# Patient Record
Sex: Male | Born: 1945
Health system: Southern US, Community
[De-identification: ages and names within clinical notes are randomized; demographics above are authoritative.]

## PROBLEM LIST (undated history)

## (undated) DIAGNOSIS — J449 Chronic obstructive pulmonary disease, unspecified: Secondary | ICD-10-CM

## (undated) DIAGNOSIS — E079 Disorder of thyroid, unspecified: Secondary | ICD-10-CM

## (undated) DIAGNOSIS — M1990 Unspecified osteoarthritis, unspecified site: Secondary | ICD-10-CM

## (undated) DIAGNOSIS — J189 Pneumonia, unspecified organism: Secondary | ICD-10-CM

## (undated) DIAGNOSIS — I10 Essential (primary) hypertension: Secondary | ICD-10-CM

## (undated) HISTORY — PX: KNEE ARTHROSCOPY: SUR90

## (undated) HISTORY — PX: NO PAST SURGERIES: SHX2092

---

## 2000-04-08 ENCOUNTER — Encounter: Payer: Self-pay | Admitting: Orthopedic Surgery

## 2000-04-08 ENCOUNTER — Encounter: Admission: RE | Admit: 2000-04-08 | Discharge: 2000-04-08 | Payer: Self-pay | Admitting: Orthopedic Surgery

## 2000-04-26 ENCOUNTER — Encounter: Admission: RE | Admit: 2000-04-26 | Discharge: 2000-07-25 | Payer: Self-pay | Admitting: Orthopedic Surgery

## 2003-03-30 ENCOUNTER — Encounter: Admission: RE | Admit: 2003-03-30 | Discharge: 2003-03-30 | Payer: Self-pay | Admitting: Family Medicine

## 2003-03-30 ENCOUNTER — Encounter: Payer: Self-pay | Admitting: Family Medicine

## 2008-02-20 ENCOUNTER — Ambulatory Visit (HOSPITAL_COMMUNITY): Admission: RE | Admit: 2008-02-20 | Discharge: 2008-02-20 | Payer: Self-pay | Admitting: *Deleted

## 2008-11-21 ENCOUNTER — Encounter: Admission: RE | Admit: 2008-11-21 | Discharge: 2008-11-21 | Payer: Self-pay | Admitting: Internal Medicine

## 2008-12-06 ENCOUNTER — Encounter (INDEPENDENT_AMBULATORY_CARE_PROVIDER_SITE_OTHER): Payer: Self-pay | Admitting: Interventional Radiology

## 2008-12-06 ENCOUNTER — Other Ambulatory Visit: Admission: RE | Admit: 2008-12-06 | Discharge: 2008-12-06 | Payer: Self-pay | Admitting: Interventional Radiology

## 2008-12-06 ENCOUNTER — Encounter: Admission: RE | Admit: 2008-12-06 | Discharge: 2008-12-06 | Payer: Self-pay | Admitting: Internal Medicine

## 2009-04-16 ENCOUNTER — Emergency Department (HOSPITAL_COMMUNITY): Admission: EM | Admit: 2009-04-16 | Discharge: 2009-04-16 | Payer: Self-pay | Admitting: Emergency Medicine

## 2011-02-01 DEATH — deceased

## 2011-03-17 NOTE — Op Note (Signed)
[name], [name]                ACCOUNT NO.:  [id number]   MEDICAL RECORD NO.:  [id number]          PATIENT TYPE:  AMB   LOCATION:  ENDO                         FACILITY:  [facility]   PHYSICIAN:  Waverly Ferrari, [name]    DATE OF BIRTH:  [birth year]   DATE OF PROCEDURE:  02/20/2008  DATE OF DISCHARGE:                               OPERATIVE REPORT   PROCEDURE:  Colonoscopy.   INDICATIONS:  Colon cancer screening.   ANESTHESIA:  Fentanyl 75 mcg, Versed 6 mg.   PROCEDURE:  With the patient mildly sedated in the left lateral  decubitus position, rectal examination was performed which was  unremarkable to my exam.  Subsequently the Pentax videoscopic  colonoscope was inserted in the rectum and passed under direct vision  rather easily to the cecum, identified with pressure applied.  The cecum  was identified by ileocecal valve and appendiceal orifice both of which  were photographed.  From this point colonoscope was slowly withdrawn  taking circumferential views of colonic mucosa stopping to photograph an  occasional diverticulum seen along the way until we reached the rectum  which appeared normal on direct and showed hemorrhoids on retroflexed  view.  The endoscope was straightened and withdrawn,  The patient's  vital signs, pulse oximeter remained stable.  The patient tolerated  procedure well without apparent complications.   FINDINGS:  Scattered diverticula throughout the colon.  Internal  hemorrhoids, otherwise unremarkable exam.   PLAN:  Consider repeat examination in 5-10 years.           ______________________________  Waverly Ferrari, [name]     GMO/MEDQ  D:  02/20/2008  T:  02/20/2008  Job:  [id number]

## 2011-12-09 ENCOUNTER — Other Ambulatory Visit: Payer: Self-pay | Admitting: Internal Medicine

## 2011-12-09 ENCOUNTER — Ambulatory Visit
Admission: RE | Admit: 2011-12-09 | Discharge: 2011-12-09 | Disposition: A | Discharge status: Home / Self Care | Payer: Medicare Other | Source: Ambulatory Visit | Attending: Internal Medicine | Admitting: Internal Medicine

## 2011-12-09 DIAGNOSIS — R0602 Shortness of breath: Secondary | ICD-10-CM

## 2011-12-09 MED ORDER — IOHEXOL 350 MG/ML SOLN
125.0000 mL | Freq: Once | INTRAVENOUS | Status: AC | PRN
  Administered 2011-12-09: 125 mL via INTRAVENOUS

## 2011-12-15 ENCOUNTER — Emergency Department (HOSPITAL_COMMUNITY): Payer: Medicare Other

## 2011-12-15 ENCOUNTER — Institutional Professional Consult (permissible substitution): Payer: Medicare Other | Admitting: Critical Care Medicine

## 2011-12-15 ENCOUNTER — Inpatient Hospital Stay (HOSPITAL_COMMUNITY)
Admission: EM | Admit: 2011-12-15 | Discharge: 2011-12-18 | DRG: 193 | Disposition: A | Discharge status: Home / Self Care | Payer: Medicare Other | Attending: Internal Medicine | Admitting: Internal Medicine

## 2011-12-15 ENCOUNTER — Encounter (HOSPITAL_COMMUNITY): Payer: Self-pay | Admitting: Emergency Medicine

## 2011-12-15 ENCOUNTER — Other Ambulatory Visit: Payer: Self-pay

## 2011-12-15 DIAGNOSIS — J9600 Acute respiratory failure, unspecified whether with hypoxia or hypercapnia: Secondary | ICD-10-CM | POA: Diagnosis present

## 2011-12-15 DIAGNOSIS — Z79899 Other long term (current) drug therapy: Secondary | ICD-10-CM

## 2011-12-15 DIAGNOSIS — J84112 Idiopathic pulmonary fibrosis: Secondary | ICD-10-CM

## 2011-12-15 DIAGNOSIS — Z87891 Personal history of nicotine dependence: Secondary | ICD-10-CM

## 2011-12-15 DIAGNOSIS — E119 Type 2 diabetes mellitus without complications: Secondary | ICD-10-CM | POA: Diagnosis present

## 2011-12-15 DIAGNOSIS — I10 Essential (primary) hypertension: Secondary | ICD-10-CM | POA: Diagnosis present

## 2011-12-15 DIAGNOSIS — M069 Rheumatoid arthritis, unspecified: Secondary | ICD-10-CM | POA: Diagnosis present

## 2011-12-15 DIAGNOSIS — J189 Pneumonia, unspecified organism: Principal | ICD-10-CM | POA: Diagnosis present

## 2011-12-15 DIAGNOSIS — J8410 Pulmonary fibrosis, unspecified: Secondary | ICD-10-CM | POA: Diagnosis present

## 2011-12-15 DIAGNOSIS — E039 Hypothyroidism, unspecified: Secondary | ICD-10-CM | POA: Diagnosis present

## 2011-12-15 HISTORY — DX: Essential (primary) hypertension: I10

## 2011-12-15 HISTORY — DX: Unspecified osteoarthritis, unspecified site: M19.90

## 2011-12-15 HISTORY — DX: Pneumonia, unspecified organism: J18.9

## 2011-12-15 HISTORY — DX: Disorder of thyroid, unspecified: E07.9

## 2011-12-15 LAB — BLOOD GAS, ARTERIAL
Acid-Base Excess: 2.8 mmol/L — ABNORMAL HIGH (ref 0.0–2.0)
Bicarbonate: 26.4 mEq/L — ABNORMAL HIGH (ref 20.0–24.0)
Drawn by: 246861
O2 Content: 2 L/min
O2 Saturation: 94.8 %
Patient temperature: 98.6
TCO2: 23.4 mmol/L (ref 0–100)
pCO2 arterial: 38.4 mmHg (ref 35.0–45.0)
pH, Arterial: 7.452 — ABNORMAL HIGH (ref 7.350–7.450)
pO2, Arterial: 74.2 mmHg — ABNORMAL LOW (ref 80.0–100.0)

## 2011-12-15 LAB — COMPREHENSIVE METABOLIC PANEL
ALT: 28 U/L (ref 0–53)
AST: 26 U/L (ref 0–37)
Albumin: 2.9 g/dL — ABNORMAL LOW (ref 3.5–5.2)
Alkaline Phosphatase: 40 U/L (ref 39–117)
BUN: 15 mg/dL (ref 6–23)
CO2: 23 mEq/L (ref 19–32)
Calcium: 8.2 mg/dL — ABNORMAL LOW (ref 8.4–10.5)
Chloride: 100 mEq/L (ref 96–112)
Creatinine, Ser: 1.07 mg/dL (ref 0.50–1.35)
GFR calc Af Amer: 82 mL/min — ABNORMAL LOW (ref >90)
GFR calc non Af Amer: 70 mL/min — ABNORMAL LOW (ref >90)
Glucose, Bld: 290 mg/dL — ABNORMAL HIGH (ref 70–99)
Potassium: 4.5 mEq/L (ref 3.5–5.1)
Sodium: 134 mEq/L — ABNORMAL LOW (ref 135–145)
Total Bilirubin: 0.3 mg/dL (ref 0.3–1.2)
Total Protein: 7.6 g/dL (ref 6.0–8.3)

## 2011-12-15 LAB — CBC
HCT: 39 % (ref 39.0–52.0)
HCT: 37.1 % — ABNORMAL LOW (ref 39.0–52.0)
Hemoglobin: 13.3 g/dL (ref 13.0–17.0)
Hemoglobin: 12.2 g/dL — ABNORMAL LOW (ref 13.0–17.0)
MCH: 30.4 pg (ref 26.0–34.0)
MCH: 29.4 pg (ref 26.0–34.0)
MCHC: 34.1 g/dL (ref 30.0–36.0)
MCHC: 32.9 g/dL (ref 30.0–36.0)
MCV: 89.2 fL (ref 78.0–100.0)
MCV: 89.4 fL (ref 78.0–100.0)
Platelets: 240 10*3/uL (ref 150–400)
Platelets: 246 10*3/uL (ref 150–400)
RBC: 4.37 MIL/uL (ref 4.22–5.81)
RBC: 4.15 MIL/uL — ABNORMAL LOW (ref 4.22–5.81)
RDW: 15.2 % (ref 11.5–15.5)
RDW: 15 % (ref 11.5–15.5)
WBC: 7 10*3/uL (ref 4.0–10.5)
WBC: 4.4 10*3/uL (ref 4.0–10.5)

## 2011-12-15 LAB — DIFFERENTIAL
Basophils Absolute: 0 10*3/uL (ref 0.0–0.1)
Basophils Relative: 0 % (ref 0–1)
Eosinophils Absolute: 0 10*3/uL (ref 0.0–0.7)
Eosinophils Relative: 0 % (ref 0–5)
Lymphocytes Relative: 12 % (ref 12–46)
Lymphs Abs: 0.5 10*3/uL — ABNORMAL LOW (ref 0.7–4.0)
Monocytes Absolute: 0 10*3/uL — ABNORMAL LOW (ref 0.1–1.0)
Monocytes Relative: 1 % — ABNORMAL LOW (ref 3–12)
Neutro Abs: 3.8 10*3/uL (ref 1.7–7.7)
Neutrophils Relative %: 87 % — ABNORMAL HIGH (ref 43–77)

## 2011-12-15 LAB — CARDIAC PANEL(CRET KIN+CKTOT+MB+TROPI)
CK, MB: 1.8 ng/mL (ref 0.3–4.0)
Relative Index: 1.2 (ref 0.0–2.5)
Total CK: 152 U/L (ref 7–232)
Troponin I: <0.3 ng/mL (ref <0.30)

## 2011-12-15 LAB — SEDIMENTATION RATE: Sed Rate: 40 mm/hr — ABNORMAL HIGH (ref 0–16)

## 2011-12-15 LAB — BASIC METABOLIC PANEL
BUN: 8 mg/dL (ref 6–23)
CO2: 26 mEq/L (ref 19–32)
Calcium: 8.5 mg/dL (ref 8.4–10.5)
Chloride: 102 mEq/L (ref 96–112)
Creatinine, Ser: 0.92 mg/dL (ref 0.50–1.35)
GFR calc Af Amer: >90 mL/min (ref >90)
GFR calc non Af Amer: 86 mL/min — ABNORMAL LOW (ref >90)
Glucose, Bld: 126 mg/dL — ABNORMAL HIGH (ref 70–99)
Potassium: 3.6 mEq/L (ref 3.5–5.1)
Sodium: 138 mEq/L (ref 135–145)

## 2011-12-15 LAB — PRO B NATRIURETIC PEPTIDE
Pro B Natriuretic peptide (BNP): 79.3 pg/mL (ref 0–125)
Pro B Natriuretic peptide (BNP): 103.7 pg/mL (ref 0–125)

## 2011-12-15 MED ORDER — ALBUTEROL SULFATE (5 MG/ML) 0.5% IN NEBU
2.5000 mg | INHALATION_SOLUTION | Freq: Four times a day (QID) | RESPIRATORY_TRACT | Status: DC
  Administered 2011-12-15: 2.5 mg via RESPIRATORY_TRACT

## 2011-12-15 MED ORDER — HYDROMORPHONE HCL PF 1 MG/ML IJ SOLN: 0.5000 mg | INTRAMUSCULAR | Status: DC | PRN

## 2011-12-15 MED ORDER — ALBUTEROL SULFATE (5 MG/ML) 0.5% IN NEBU
5.0000 mg | INHALATION_SOLUTION | Freq: Once | RESPIRATORY_TRACT | Status: AC
  Administered 2011-12-15: 5 mg via RESPIRATORY_TRACT
  Filled 2011-12-15: qty 1

## 2011-12-15 MED ORDER — SODIUM CHLORIDE 0.9 % IV SOLN
INTRAVENOUS | Status: DC
  Administered 2011-12-15: 13:00:00 via INTRAVENOUS

## 2011-12-15 MED ORDER — ONDANSETRON HCL 4 MG/2ML IJ SOLN: 4.0000 mg | Freq: Four times a day (QID) | INTRAMUSCULAR | Status: DC | PRN

## 2011-12-15 MED ORDER — LEVOTHYROXINE SODIUM 25 MCG PO TABS
25.0000 ug | ORAL_TABLET | Freq: Every day | ORAL | Status: DC
  Filled 2011-12-15: qty 1

## 2011-12-15 MED ORDER — LEVOTHYROXINE SODIUM 25 MCG PO TABS
25.0000 ug | ORAL_TABLET | Freq: Every day | ORAL | Status: DC
  Administered 2011-12-16 – 2011-12-18 (×3): 25 ug via ORAL
  Filled 2011-12-15 (×5): qty 1

## 2011-12-15 MED ORDER — ALBUTEROL SULFATE (5 MG/ML) 0.5% IN NEBU
2.5000 mg | INHALATION_SOLUTION | Freq: Four times a day (QID) | RESPIRATORY_TRACT | Status: DC
  Administered 2011-12-15 – 2011-12-18 (×12): 2.5 mg via RESPIRATORY_TRACT
  Filled 2011-12-15 (×8): qty 0.5
  Filled 2011-12-15: qty 1
  Filled 2011-12-15 (×3): qty 0.5

## 2011-12-15 MED ORDER — PIPERACILLIN-TAZOBACTAM 3.375 G IVPB
3.3750 g | Freq: Once | INTRAVENOUS | Status: AC
  Administered 2011-12-15: 3.375 g via INTRAVENOUS
  Filled 2011-12-15: qty 50

## 2011-12-15 MED ORDER — METHYLPREDNISOLONE SODIUM SUCC 40 MG IJ SOLR
40.0000 mg | Freq: Four times a day (QID) | INTRAMUSCULAR | Status: DC
  Administered 2011-12-15: 40 mg via INTRAVENOUS
  Filled 2011-12-15: qty 1

## 2011-12-15 MED ORDER — PIOGLITAZONE HCL 15 MG PO TABS
15.0000 mg | ORAL_TABLET | Freq: Two times a day (BID) | ORAL | Status: DC
  Administered 2011-12-16 – 2011-12-18 (×5): 15 mg via ORAL
  Filled 2011-12-15 (×8): qty 1

## 2011-12-15 MED ORDER — GUAIFENESIN-DM 100-10 MG/5ML PO SYRP: 5.0000 mL | ORAL_SOLUTION | ORAL | Status: DC | PRN

## 2011-12-15 MED ORDER — METFORMIN HCL 850 MG PO TABS
850.0000 mg | ORAL_TABLET | Freq: Two times a day (BID) | ORAL | Status: DC
  Administered 2011-12-16 – 2011-12-17 (×3): 850 mg via ORAL
  Filled 2011-12-15 (×6): qty 1

## 2011-12-15 MED ORDER — AMLODIPINE BESYLATE 10 MG PO TABS
10.0000 mg | ORAL_TABLET | Freq: Every day | ORAL | Status: DC
  Administered 2011-12-15 – 2011-12-18 (×4): 10 mg via ORAL
  Filled 2011-12-15 (×4): qty 1

## 2011-12-15 MED ORDER — PIOGLITAZONE HCL-METFORMIN HCL 15-850 MG PO TABS: 1.0000 | ORAL_TABLET | Freq: Two times a day (BID) | ORAL | Status: DC

## 2011-12-15 MED ORDER — AZITHROMYCIN 500 MG IV SOLR
500.0000 mg | Freq: Once | INTRAVENOUS | Status: AC
  Administered 2011-12-15: 500 mg via INTRAVENOUS
  Filled 2011-12-15: qty 500

## 2011-12-15 MED ORDER — PANTOPRAZOLE SODIUM 40 MG PO TBEC
40.0000 mg | DELAYED_RELEASE_TABLET | Freq: Two times a day (BID) | ORAL | Status: DC
  Administered 2011-12-16 – 2011-12-18 (×5): 40 mg via ORAL
  Filled 2011-12-15 (×7): qty 1

## 2011-12-15 MED ORDER — ACETAMINOPHEN 650 MG RE SUPP: 650.0000 mg | Freq: Four times a day (QID) | RECTAL | Status: DC | PRN

## 2011-12-15 MED ORDER — DEXTROSE 5 % IV SOLN
500.0000 mg | INTRAVENOUS | Status: DC
  Administered 2011-12-16: 500 mg via INTRAVENOUS
  Filled 2011-12-15 (×2): qty 500

## 2011-12-15 MED ORDER — FOLIC ACID 800 MCG PO TABS: 400.0000 ug | ORAL_TABLET | Freq: Every day | ORAL | Status: DC

## 2011-12-15 MED ORDER — ACETAMINOPHEN 325 MG PO TABS
650.0000 mg | ORAL_TABLET | Freq: Four times a day (QID) | ORAL | Status: DC | PRN
  Administered 2011-12-17: 650 mg via ORAL
  Filled 2011-12-15: qty 2

## 2011-12-15 MED ORDER — METHYLPREDNISOLONE SODIUM SUCC 125 MG IJ SOLR
125.0000 mg | Freq: Once | INTRAMUSCULAR | Status: AC
  Administered 2011-12-15: 125 mg via INTRAVENOUS
  Filled 2011-12-15: qty 2

## 2011-12-15 MED ORDER — METHYLPREDNISOLONE SODIUM SUCC 125 MG IJ SOLR
80.0000 mg | Freq: Two times a day (BID) | INTRAMUSCULAR | Status: DC
  Administered 2011-12-16 – 2011-12-17 (×4): 80 mg via INTRAVENOUS
  Filled 2011-12-15 (×7): qty 2

## 2011-12-15 MED ORDER — FOLIC ACID 0.5 MG HALF TAB
0.5000 mg | ORAL_TABLET | Freq: Every day | ORAL | Status: DC
  Administered 2011-12-15 – 2011-12-18 (×4): 0.5 mg via ORAL
  Filled 2011-12-15 (×4): qty 1

## 2011-12-15 MED ORDER — ENOXAPARIN SODIUM 60 MG/0.6ML ~~LOC~~ SOLN
50.0000 mg | SUBCUTANEOUS | Status: DC
  Administered 2011-12-16 – 2011-12-17 (×2): 50 mg via SUBCUTANEOUS
  Filled 2011-12-15 (×3): qty 0.6

## 2011-12-15 MED ORDER — ALBUTEROL SULFATE (5 MG/ML) 0.5% IN NEBU: 2.5000 mg | INHALATION_SOLUTION | RESPIRATORY_TRACT | Status: DC | PRN

## 2011-12-15 MED ORDER — ENOXAPARIN SODIUM 40 MG/0.4ML ~~LOC~~ SOLN
40.0000 mg | SUBCUTANEOUS | Status: DC
  Administered 2011-12-15: 40 mg via SUBCUTANEOUS
  Filled 2011-12-15 (×2): qty 0.4

## 2011-12-15 MED ORDER — VANCOMYCIN HCL IN DEXTROSE 1-5 GM/200ML-% IV SOLN
1000.0000 mg | Freq: Once | INTRAVENOUS | Status: AC
  Administered 2011-12-15: 1000 mg via INTRAVENOUS
  Filled 2011-12-15: qty 200

## 2011-12-15 MED ORDER — IPRATROPIUM BROMIDE 0.02 % IN SOLN
0.5000 mg | Freq: Four times a day (QID) | RESPIRATORY_TRACT | Status: DC
  Administered 2011-12-15 – 2011-12-18 (×12): 0.5 mg via RESPIRATORY_TRACT
  Filled 2011-12-15 (×12): qty 2.5

## 2011-12-15 MED ORDER — ONDANSETRON HCL 4 MG PO TABS: 4.0000 mg | ORAL_TABLET | Freq: Four times a day (QID) | ORAL | Status: DC | PRN

## 2011-12-15 NOTE — Consult Note (Signed)
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 He developed increased DOE to were he was SOB walking >15 feet. Normally prior to 1 month age he had no limitation of activities(confirmed by family). + for night sweats but no fever, chills or purulent sputum. He went to his PCP Dr. Jillyn Ledger and was dx with Pna and treated with 2 rounds of Avelox and Prednisone without any improvement in sxs. He has a appointment with Dr. Chase Caller of pulmonary but on 2/12 due SOB he presented to [facility] ED and was noted to be hypoxic.  Report to have to have UIP on ct scan. CxR with ?progressive pna and PCCM asked to evaluate  Pm 2/12  Lines / Drains: none  Cultures: 2/12 bc x 2>>  Antibiotics: 2/12 vanc ([facility])>> 2/12 zithro ([facility])>> 2/12 zoysn([facility])>>  Tests / Events:   Subjective: NAD at rest on 02, denies change in chronic dry cough  Past Medical History  Diagnosis Date  . Hypertension   . Diabetes mellitus   . Arthritis   . Thyroid disease   . Pneumonia    Past Surgical History  Procedure Date  . No past surgeries    Prior to Admission medications   Medication Sig Start Date End Date Taking? Authorizing Provider  albuterol-ipratropium (COMBIVENT) 18-103 MCG/ACT inhaler Inhale 2 puffs into the lungs every 6 (six) hours as needed. For shortness of breath   Yes Historical Provider, MD  amLODipine (NORVASC) 10 MG tablet Take 10 mg by mouth daily.   Yes Historical Provider, MD  budesonide (PULMICORT) 0.5 MG/2ML nebulizer solution Take 0.5 mg by nebulization 2 (two) times daily.   Yes Historical Provider, MD  folic acid (FOLVITE) 390 MCG tablet Take 400 mcg by mouth daily.   Yes Historical Provider, MD  levothyroxine (SYNTHROID, LEVOTHROID) 25 MCG tablet Take 25 mcg by mouth daily.   Yes Historical Provider, MD    methotrexate 2.5 MG tablet Take 2.5 mg by mouth once a week. On Thursday. Patient takes 8 tablets once per week   Yes Historical Provider, MD  moxifloxacin (AVELOX) 400 MG tablet Take 400 mg by mouth daily. For 10 days. 12/08/11 12/18/11 Yes Historical Provider, MD  pioglitazone-metformin (ACTOPLUS MET) 15-850 MG per tablet Take 1 tablet by mouth 2 (two) times daily with a meal.   Yes Historical Provider, MD  predniSONE (DELTASONE) 5 MG tablet Take 5 mg by mouth daily.   Yes Historical Provider, MD   Allergies No Known Allergies  Family History Family History  Problem Relation Age of Onset  . Diabetes Mother   . Stroke Mother     Social History  reports that he quit smoking about 40 years ago. He has never used smokeless tobacco. He reports that he does not drink alcohol or use illicit drugs.  Review Of Systems  11 points review of systems is negative with an exception of listed in HPI. No toxic exposures.  Vital Signs: Temp:  [98.8 F (37.1 C)] 98.8 F (37.1 C) (02/12 0708) Pulse Rate:  [114] 114  (02/12 0708) Resp:  [24] 24  (02/12 0708) BP: (157)/(73) 157/73 mmHg (02/12 0708) SpO2:  [88 %-97 %] 96 % (02/12 0820) FiO2 (%):  [28 %] 28 % (02/12 0727)    Physical Examination: General:  [id number]  Neuro:  intact HEENT:  Very poor dentation Cardiovascular:  hsr rrr Lungs:  No wheeze. Good air movement Abdomen: obese +bs Musculoskeletal:  No pedal edema Skin:  Warm and dry no edema   Labs and Imaging:  Dg Chest 2 View  12/15/2011  *RADIOLOGY REPORT*  Clinical Data: 66 year old male with cough, shortness of breath. Recent pneumonia.  CHEST - 2 VIEW  Comparison: Chest CT 12/09/2011 and earlier.  Findings: Slightly lower lung volumes.  Stable cardiac size and mediastinal contours.  Progression of mixed interstitial and airspace opacity in the right upper lobe and at both lung bases. The upper lobe has increased the most.  No pneumothorax.  No pleural effusion.  The left upper  lobe remain spared. No acute osseous abnormality identified.  IMPRESSION: Mild radiographic progression of bilateral pneumonia.  Original Report Authenticated By: Randall An, [name]    Lab 12/15/11 0750  NA 138  K 3.6  CL 102  CO2 26  BUN 8  CREATININE 0.92  GLUCOSE 126*    Lab 12/15/11 0750  HGB 13.3  HCT 39.0  WBC 7.0  PLT 240   ABG    Component Value Date/Time   PHART 7.452* 12/15/2011 0810   PCO2ART 38.4 12/15/2011 0810   PO2ART 74.2* 12/15/2011 0810   HCO3 26.4* 12/15/2011 0810   TCO2 23.4 12/15/2011 0810   O2SAT 94.8 12/15/2011 0810    Assessment and Plan: Hypoic resp failure in 66 yo AAM with recent URI with radigraphic changes non specific in pt with RA on MTX -O2 as needed -BD's -steroids -check esr/bnp may need 2 d echo -D/C MTX - the fact his RA is well controlled while resp status is worsening over > one month strongly favors mtx lung over RA lung dz and CAP    Best practices / Disposition: -->Floor status under [facility] -->full code -->LMWH for DVT Px -->Protonix for GI Px -->diet   Richardson Landry Minor ACNP Maryanna Shape PCCM Pager [phone] till 3 pm If no answer page [phone] 12/15/2011, 12:08 PM  Pt independently  seen and examined and available cxr's reviewed and I agree with above findings/ imp/ plan   Christinia Gully, MD Pulmonary and [facility] [phone]

## 2011-12-15 NOTE — ED Notes (Signed)
Pt reports shortness of breath with exertion, beginning 1 month ago, shortness of breath has been increasing.

## 2011-12-15 NOTE — H&P (Signed)
PCP:  Ramashandran  Chief Complaint:  Shortness of breath  HPI: Patient is a 66 year old African American male with past medical history significant for multiple medical problems including diabetes hypertension rheumatoid arthritis and thyroid disease. Patient stated that about 2-3 weeks ago he developed some shortness of breath, his primary care physician started him on antibiotic for bilateral pneumonia. He was treated for 7-10 days without any improvement in his symptoms. He followup up with his primary care physician and was given a course of Avelox, steroids and neb treatments. He did not have any improvement. He was sent to get a CT scan of the chest.. CT scan read usual interstitial pneumonitis. He was scheduled with an appointment to see the Pulmonologist. He continued to be short of breath so he came to the emergency room. He's had no chest pain. He complains of cough that's nonproductive. He does take methotrexate prescribed by his rheumatologist for rheumatoid arthritis.  Review of Systems:  Negative otherwise stated in the history of present illness.  Past Medical History: Past Medical History  Diagnosis Date  . Hypertension   . Diabetes mellitus   . Arthritis   . Thyroid disease   . Pneumonia    History reviewed. No pertinent past surgical history.  Medications: Prior to Admission medications   Medication Sig Start Date End Date Taking? Authorizing Provider  albuterol-ipratropium (COMBIVENT) 18-103 MCG/ACT inhaler Inhale 2 puffs into the lungs every 6 (six) hours as needed. For shortness of breath   Yes Historical Provider, MD  amLODipine (NORVASC) 10 MG tablet Take 10 mg by mouth daily.   Yes Historical Provider, MD  budesonide (PULMICORT) 0.5 MG/2ML nebulizer solution Take 0.5 mg by nebulization 2 (two) times daily.   Yes Historical Provider, MD  folic acid (FOLVITE) 628 MCG tablet Take 400 mcg by mouth daily.   Yes Historical Provider, MD  levothyroxine (SYNTHROID,  LEVOTHROID) 25 MCG tablet Take 25 mcg by mouth daily.   Yes Historical Provider, MD  methotrexate 2.5 MG tablet Take 2.5 mg by mouth once a week. On Thursday. Patient takes 8 tablets once per week   Yes Historical Provider, MD  moxifloxacin (AVELOX) 400 MG tablet Take 400 mg by mouth daily. For 10 days. 12/08/11 12/18/11 Yes Historical Provider, MD  pioglitazone-metformin (ACTOPLUS MET) 15-850 MG per tablet Take 1 tablet by mouth 2 (two) times daily with a meal.   Yes Historical Provider, MD  predniSONE (DELTASONE) 5 MG tablet Take 5 mg by mouth daily.   Yes Historical Provider, MD    Allergies:  No Known Allergies  Social History:  Reports that he has quit smoking. He has never used smokeless tobacco. He reports that he does not drink alcohol or use illicit drugs. The patient is married with one stepdaughter.   Family History: Family History  Problem Relation Age of Onset  . Diabetes Mother   . Stroke Mother     Physical Exam: Filed Vitals:   12/15/11 0708 12/15/11 0727 12/15/11 0741 12/15/11 0820  BP: 157/73     Pulse: 114     Temp: 98.8 F (37.1 C)     TempSrc: Oral     Resp: 24     SpO2: 88% 96% 97% 96%   General: Patient appears his stated age and does not seem to be in any acute distress. HEENT: Head normocephalic atraumatic. Lungs: Decreased breath sounds throughout with some rhonchi. Cardiovascular: Slightly tachycardic without murmurs rubs or gallops Abdomen: Positive bowel sounds Extremities: No edema Neurologic: Nonfocal.  Labs on Admission:   [facility] 12/15/11 0750  NA 138  K 3.6  CL 102  CO2 26  GLUCOSE 126*  BUN 8  CREATININE 0.92  CALCIUM 8.5  MG --  PHOS --   No results found for this basename: AST:2,ALT:2,ALKPHOS:2,BILITOT:2,PROT:2,ALBUMIN:2 in the last 72 hours No results found for this basename: LIPASE:2,AMYLASE:2 in the last 72 hours  Basename 12/15/11 0750  WBC 7.0  NEUTROABS --  HGB 13.3  HCT 39.0  MCV 89.2  PLT 240    Basename  12/15/11 0750  CKTOTAL 152  CKMB 1.8  CKMBINDEX --  TROPONINI <0.30   No results found for this basename: TSH,T4TOTAL,FREET3,T3FREE,THYROIDAB in the last 72 hours No results found for this basename: VITAMINB12:2,FOLATE:2,FERRITIN:2,TIBC:2,IRON:2,RETICCTPCT:2 in the last 72 hours  Radiological Exams on Admission: Dg Chest 2 View  12/15/2011  *RADIOLOGY REPORT*  Clinical Data: 66 year old male with cough, shortness of breath. Recent pneumonia.  CHEST - 2 VIEW  Comparison: Chest CT 12/09/2011 and earlier.  Findings: Slightly lower lung volumes.  Stable cardiac size and mediastinal contours.  Progression of mixed interstitial and airspace opacity in the right upper lobe and at both lung bases. The upper lobe has increased the most.  No pneumothorax.  No pleural effusion.  The left upper lobe remain spared. No acute osseous abnormality identified.  IMPRESSION: Mild radiographic progression of bilateral pneumonia.  Original Report Authenticated By: Randall An, [name]   Ct Angio Chest W/cm &/or Wo Cm  12/09/2011  *RADIOLOGY REPORT*  Clinical Data: Shortness of breath.  History pneumonia.  CT ANGIOGRAPHY CHEST  Technique:  Multidetector CT imaging of the chest using the standard protocol during bolus administration of intravenous contrast. Multiplanar reconstructed images including MIPs were obtained and reviewed to evaluate the vascular anatomy.  Contrast: 125m OMNIPAQUE IOHEXOL 350 MG/ML IV SOLN  Comparison: Plain film chest 11/23/2011.  Findings: No pulmonary embolus is identified.  There is cardiomegaly.  No pleural or pericardial effusion.  There is some lymphadenopathy with a right hilar node on image 54 measuring 1.4 cm short axis dimension.  Precarinal node on image 46 measures 1.6 cm short axis dimension.  Prevascular node on image 39 measures 1.5 cm short axis dimension.  Finally, a right paratracheal node on image 36 measures 1.2 cm short axis dimension.  The lungs demonstrate extensive  macrocystic honeycombing extending to the subpleural space.  Traction bronchiectasis is present. Changes are greater on the right and in the lung bases.  Scattered ground-glass attenuation is noted.  No consolidative process or pneumothorax is seen.  Incidentally imaged upper abdomen shows fatty infiltration of the visualized liver.  Imaged intra-abdominal contents are otherwise unremarkable.  No focal bony abnormality.  IMPRESSION:  1.  Negative for pulmonary embolus. 2.  Interstitial lung disease with an appearance most consistent with usual interstitial pneumonia ("UIP").  Associated hilar and mediastinal lymphadenopathy is noted.  Original Report Authenticated By: TArvid Right DLuther Parody [name]    Assessment/Plan .UIP (usual interstitial pneumonitis) Will ask for pulmonary consult to assist with management. Start him on IV Solu-Medrol 40 mg IV every 6. Will start him on Azithromycin to cover for atypical pneumonia. unsure that this is pneumonia so will not broadly cover him. He received IV Zosyn and Vanc in the emergency room. Will defer to pulmonary to see if further antibiotics is indicated since he was treated outpatient with several rounds of antibiotic.  .[name] Kitchenneumonia As mentioned above patient does not have any fever.  Will treat him with his azithromycin and defer  to pulmonary whether to broaden antibiotic.   [name] KitchenHTN (hypertension) Continue him on his home medication.   .Rheumatoid arthritis Will hold methotrexate for now secondary to pulmonary process.   .Hypothyroidism Check TSH and continue Synthroid  Time spent on this patient including examination and decision-making process:60 minutes.  Noel Christmas [phone] 12/15/2011, 11:34 AM

## 2011-12-15 NOTE — ED Provider Notes (Signed)
History     CSN: [id number]  Arrival date & time 12/15/11  0705   First MD Initiated Contact with Patient 12/15/11 0719      Chief Complaint  Patient presents with  . Shortness of Breath    (Consider location/radiation/quality/duration/timing/severity/associated sxs/prior treatment) Patient is a 66 y.o. male presenting with shortness of breath.  Shortness of Breath  The current episode started more than 1 week ago. The problem is moderate. The symptoms are relieved by rest. The symptoms are aggravated by activity. Associated symptoms include cough and shortness of breath. Pertinent negatives include no chest pain, no fever, no rhinorrhea and no sore throat.   patient's wife states that he was treated by the primary care physician for "double pneumonia." 2 weeks ago. He finished the antibiotics, but apparently never got better. She states he was then started on an additional course of antibiotics including Avelox and was started on prednisone and albuterol. She also states she has recently been referred to a pulmonologist but has not yet seen them. Patient continues to be short of breath, especially with, exertion. He's had no chest pain or pleuritic pain. No fevers. Denies any nausea, vomiting. He has a former smoker. Patient states he can walk no more than approximately 10 steps before becoming short of breath. He denies any leg swelling.  Currently not on home oxygen. Noted to be slightly hypoxic in triage  Past Medical History  Diagnosis Date  . Hypertension   . Diabetes mellitus   . Arthritis   . Thyroid disease   . Pneumonia     History reviewed. No pertinent past surgical history.  Family History  Problem Relation Age of Onset  . Diabetes Mother   . Stroke Mother     History  Substance Use Topics  . Smoking status: Former Research scientist (life sciences)  . Smokeless tobacco: Never Used  . Alcohol Use: No      Review of Systems  Constitutional: Negative for fever.  HENT: Negative for sore  throat and rhinorrhea.   Respiratory: Positive for cough and shortness of breath.   Cardiovascular: Negative for chest pain.  All other systems reviewed and are negative.    Allergies  Review of patient's allergies indicates no known allergies.  Home Medications   Current Outpatient Rx  Name Route Sig Dispense Refill  . IPRATROPIUM-ALBUTEROL 18-103 MCG/ACT IN AERO Inhalation Inhale 2 puffs into the lungs every 6 (six) hours as needed. For shortness of breath    . AMLODIPINE BESYLATE 10 MG PO TABS Oral Take 10 mg by mouth daily.    . BUDESONIDE 0.5 MG/2ML IN SUSP Nebulization Take 0.5 mg by nebulization 2 (two) times daily.    [name] Kitchen FOLIC ACID 517 MCG PO TABS Oral Take 400 mcg by mouth daily.    [name] Kitchen LEVOTHYROXINE SODIUM 25 MCG PO TABS Oral Take 25 mcg by mouth daily.    [name] Kitchen METHOTREXATE SODIUM 2.5 MG PO TABS Oral Take 2.5 mg by mouth once a week. On Thursday. Patient takes 8 tablets once per week    . MOXIFLOXACIN HCL 400 MG PO TABS Oral Take 400 mg by mouth daily. For 10 days.    [name] Kitchen PIOGLITAZONE HCL-METFORMIN HCL 15-850 MG PO TABS Oral Take 1 tablet by mouth 2 (two) times daily with a meal.    . PREDNISONE 5 MG PO TABS Oral Take 5 mg by mouth daily.      BP 157/73  Pulse 114  Temp(Src) 98.8 F (37.1 C) (Oral)  Resp 24  SpO2 96%  Physical Exam  Nursing note and vitals reviewed. Constitutional: [name] oriented to person, place, and time. He appears well-developed and well-nourished.  HENT:  Head: Normocephalic and atraumatic.  Eyes: Conjunctivae and EOM are normal. Pupils are equal, round, and reactive to light.  Neck: Neck supple.  Cardiovascular: Normal rate and regular rhythm.  Exam reveals no gallop and no friction rub.   No murmur heard. Pulmonary/Chest: Effort normal and breath sounds normal. No respiratory distress. He has no wheezes. He has no rales. He exhibits no tenderness.       Scattered wheezing, and low pitched rhonchi on the right greater than left  Abdominal: Soft.  Bowel sounds are normal. He exhibits no distension. There is no tenderness. There is no rebound and no guarding.  Musculoskeletal: Normal range of motion.  Neurological: [name] alert and oriented to person, place, and time. No cranial nerve deficit. Coordination normal.  Skin: Skin is warm and dry. No rash noted.  Psychiatric: He has a normal mood and affect.    ED Course  Procedures (including critical care time)  Labs Reviewed  BASIC METABOLIC PANEL - Abnormal; Notable for the following:    Glucose, Bld 126 (*)    GFR calc non Af Amer 86 (*)    All other components within normal limits  BLOOD GAS, ARTERIAL - Abnormal; Notable for the following:    pH, Arterial 7.452 (*)    pO2, Arterial 74.2 (*)    Bicarbonate 26.4 (*)    Acid-Base Excess 2.8 (*)    All other components within normal limits  CARDIAC PANEL(CRET KIN+CKTOT+MB+TROPI)  CBC  CULTURE, BLOOD (ROUTINE X 2)  CULTURE, BLOOD (ROUTINE X 2)   Dg Chest 2 View  12/15/2011  *RADIOLOGY REPORT*  Clinical Data: 66 year old male with cough, shortness of breath. Recent pneumonia.  CHEST - 2 VIEW  Comparison: Chest CT 12/09/2011 and earlier.  Findings: Slightly lower lung volumes.  Stable cardiac size and mediastinal contours.  Progression of mixed interstitial and airspace opacity in the right upper lobe and at both lung bases. The upper lobe has increased the most.  No pneumothorax.  No pleural effusion.  The left upper lobe remain spared. No acute osseous abnormality identified.  IMPRESSION: Mild radiographic progression of bilateral pneumonia.  Original Report Authenticated By: Randall An, [name]     1. CAP (community acquired pneumonia)   2. UIP (usual interstitial pneumonitis)       MDM  Patient is seen and examined, initial history and physical is completed. Evaluation initiated    IV, o2, monitor, cont. Pulse ox, stat labs, CXR, ABG, albuterol and solumedrol, will follow  =============================== Recent  CTA:  *RADIOLOGY REPORT*  Clinical Data: Shortness of breath. History pneumonia.  CT ANGIOGRAPHY CHEST  Technique: Multidetector CT imaging of the chest using the  standard protocol during bolus administration of intravenous  contrast. Multiplanar reconstructed images including MIPs were  obtained and reviewed to evaluate the vascular anatomy.  Contrast: 139m OMNIPAQUE IOHEXOL 350 MG/ML IV SOLN  Comparison: Plain film chest 11/23/2011.  Findings: No pulmonary embolus is identified. There is  cardiomegaly. No pleural or pericardial effusion. There is some  lymphadenopathy with a right hilar node on image 54 measuring 1.4  cm short axis dimension. Precarinal node on image 46 measures 1.6  cm short axis dimension. Prevascular node on image 39 measures 1.5  cm short axis dimension. Finally, a right paratracheal node on  image 36 measures 1.2 cm short axis  dimension.  The lungs demonstrate extensive macrocystic honeycombing extending  to the subpleural space. Traction bronchiectasis is present.  Changes are greater on the right and in the lung bases. Scattered  ground-glass attenuation is noted. No consolidative process or  pneumothorax is seen.  Incidentally imaged upper abdomen shows fatty infiltration of the  visualized liver. Imaged intra-abdominal contents are otherwise  unremarkable. No focal bony abnormality.  IMPRESSION:  1. Negative for pulmonary embolus.  2. Interstitial lung disease with an appearance most consistent  with usual interstitial pneumonia ("UIP"). Associated hilar and  mediastinal lymphadenopathy is noted.  Original Report Authenticated By: Arvid Right. D'ALESSIO, [name] ====================================================   Date: 12/15/2011  Rate: 99  Rhythm: sinus tachycardia  QRS Axis: normal  Intervals: normal  ST/T Wave abnormalities: normal and PVC  Conduction Disutrbances:none  Narrative Interpretation:   Old EKG Reviewed: none available    10:06  AM  Patient will be admitted for bilateral pneumonia. He has failed outpatient treatment on 2 different antibiotics. Currently on Avelox without any improvement  Discussed with the hospitalist for admission. She is requesting vancomycin, Zosyn, and also a Zithromax to cover for atypicals. We also discussed the possibility of healthcare acquired pneumonia, but the hospitalist felt that this was more a community-acquired pneumonia with failed outpatient treatment. Will be admitted in stable condition.  Also, please note a recent CT scan, which showed interstitial lung disease with usual interstitial pneumonia UIP  Zayleigh Stroh A. Lauris Poag, MD 12/15/11 1007

## 2011-12-16 DIAGNOSIS — R0602 Shortness of breath: Secondary | ICD-10-CM

## 2011-12-16 LAB — GLUCOSE, CAPILLARY
Glucose-Capillary: 213 mg/dL — ABNORMAL HIGH (ref 70–99)
Glucose-Capillary: 254 mg/dL — ABNORMAL HIGH (ref 70–99)
Glucose-Capillary: 216 mg/dL — ABNORMAL HIGH (ref 70–99)
Glucose-Capillary: 242 mg/dL — ABNORMAL HIGH (ref 70–99)

## 2011-12-16 LAB — HEMOGLOBIN A1C
Hgb A1c MFr Bld: 7.8 % — ABNORMAL HIGH (ref <5.7)
Mean Plasma Glucose: 177 mg/dL — ABNORMAL HIGH (ref <117)

## 2011-12-16 LAB — CBC
HCT: 37.7 % — ABNORMAL LOW (ref 39.0–52.0)
Hemoglobin: 12.3 g/dL — ABNORMAL LOW (ref 13.0–17.0)
MCH: 28.9 pg (ref 26.0–34.0)
MCHC: 32.6 g/dL (ref 30.0–36.0)
MCV: 88.7 fL (ref 78.0–100.0)
Platelets: 253 10*3/uL (ref 150–400)
RBC: 4.25 MIL/uL (ref 4.22–5.81)
RDW: 15.1 % (ref 11.5–15.5)
WBC: 6.3 10*3/uL (ref 4.0–10.5)

## 2011-12-16 LAB — TSH: TSH: 0.377 u[IU]/mL (ref 0.350–4.500)

## 2011-12-16 LAB — BASIC METABOLIC PANEL
BUN: 17 mg/dL (ref 6–23)
CO2: 24 mEq/L (ref 19–32)
Calcium: 8.5 mg/dL (ref 8.4–10.5)
Chloride: 103 mEq/L (ref 96–112)
Creatinine, Ser: 0.87 mg/dL (ref 0.50–1.35)
GFR calc Af Amer: >90 mL/min (ref >90)
GFR calc non Af Amer: 88 mL/min — ABNORMAL LOW (ref >90)
Glucose, Bld: 241 mg/dL — ABNORMAL HIGH (ref 70–99)
Potassium: 4.5 mEq/L (ref 3.5–5.1)
Sodium: 136 mEq/L (ref 135–145)

## 2011-12-16 MED ORDER — INSULIN ASPART 100 UNIT/ML ~~LOC~~ SOLN
0.0000 [IU] | Freq: Three times a day (TID) | SUBCUTANEOUS | Status: DC
  Administered 2011-12-16: 5 [IU] via SUBCUTANEOUS
  Administered 2011-12-16: 8 [IU] via SUBCUTANEOUS
  Administered 2011-12-16: 5 [IU] via SUBCUTANEOUS
  Administered 2011-12-17 (×3): 3 [IU] via SUBCUTANEOUS
  Administered 2011-12-18: 8 [IU] via SUBCUTANEOUS
  Administered 2011-12-18: 5 [IU] via SUBCUTANEOUS
  Filled 2011-12-16: qty 3

## 2011-12-16 NOTE — Progress Notes (Signed)
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 He developed increased DOE to were he was SOB walking >15 feet. Normally prior to 1 month  pta he had no limitation of activities(confirmed by family). + for night sweats but no fever, chills or purulent sputum. He went to his PCP Dr. Jillyn Ledger and was dx with Pna and treated with 2 rounds of Avelox and Prednisone without any improvement in sxs. He has a appointment with Dr. Chase Caller of pulmonary but on 2/12 due SOB he presented to [facility] ED and was noted to be hypoxic.  Report to have to have UIP on ct scan. CxR with ?progressive pna and PCCM asked to evaluate  Pm 2/12 dx  PF ? Related to mtx vs RA vs idiopathic     Cultures: 2/12 bc x 2>>  Antibiotics: 2/12 vanc (triad)>> 2/12 2/12 zithro (triad)>> 2/13 2/12 zoysn(triad)>> 2/12  Tests / Events:    Vital Signs: Temp:  [97.4 F (36.3 C)-98.9 F (37.2 C)] 97.6 F (36.4 C) (02/13 0520) Pulse Rate:  [86-97] 86  (02/13 0520) Resp:  [18-22] 20  (02/13 0520) BP: (125-147)/(71-83) 135/77 mmHg (02/13 0520) SpO2:  [91 %-98 %] 95 % (02/13 0818) FiO2 (%):  [28 %] 28 % (02/12 1236) Weight:  [226 lb 9.6 oz (102.785 kg)] 226 lb 9.6 oz (102.785 kg) (02/12 1743) I/O last 3 completed shifts: In: 650 [I.V.:400; IV Piggyback:250] Out: 575 [Urine:575]  Physical Examination: Sats 96 on 3lpm at rest General:  WNWDAAMNAD_0  Neuro: intact HEENT:  Very poor dentation Cardiovascular:  hsr rrr Lungs:  No wheeze. Good air movement Abdomen: obese +bs Musculoskeletal:  No pedal edema Skin:  Warm and dry no edema   Labs and Imaging:  Dg Chest 2 View  12/15/2011  *RADIOLOGY REPORT*  Clinical Data: 66 year old male with cough, shortness of breath. Recent pneumonia.  CHEST - 2 VIEW  Comparison: Chest CT 12/09/2011 and  earlier.  Findings: Slightly lower lung volumes.  Stable cardiac size and mediastinal contours.  Progression of mixed interstitial and airspace opacity in the right upper lobe and at both lung bases. The upper lobe has increased the most.  No pneumothorax.  No pleural effusion.  The left upper lobe remain spared. No acute osseous abnormality identified.  IMPRESSION: Mild radiographic progression of bilateral pneumonia.  Original Report Authenticated By: Randall An, [name]    Lab 12/16/11 0455 12/15/11 1855 12/15/11 0750  NA 136 134* 138  K 4.5 4.5 3.6  CL 103 100 102  CO2 _1 BUN _2 CREATININE 0.87 1.07 0.92  GLUCOSE 241* 290* 126*    Lab 12/16/11 0455 12/15/11 1855 12/15/11 0750  HGB 12.3* 12.2* 13.3  HCT 37.7* 37.1* 39.0  WBC 6.3 4.4 7.0  PLT 253 246 240   ABG    Component Value Date/Time   PHART 7.452* 12/15/2011 0810   PCO2ART 38.4 12/15/2011 0810   PO2ART 74.2* 12/15/2011 0810   HCO3 26.4* 12/15/2011 0810   TCO2 23.4 12/15/2011 0810   O2SAT 94.8 12/15/2011 0810    Assessment and Plan: Hypoic resp failure in 66 yo AAM with recent URI with radigraphic changes non specific in pt with RA on MTX -O2 as needed -BD's -steroids -  Esr/bnp 104/ 40 favors PF, not  chf - D/C MTX - the fact his RA is well controlled while resp status is worsening over > one month strongly favors mtx lung over RA lung dz and CAP (d/c all abx 2/13 as per dashboard)    Best practices / Disposition: -->Floor status under triad -->full code -->LMWH for DVT Px -->Protonix for GI Px -->diet   Richardson Landry Minor ACNP [facility] PCCM Pager [phone] till 3 pm If no answer page [phone] 12/16/2011, 10:51 AM  Pt independently  seen and examined and available cxr's reviewed and I agree with above findings/ imp/ plan   Christinia Gully, MD Pulmonary and [place] [facility] [phone]

## 2011-12-16 NOTE — Progress Notes (Signed)
Subjective: Patient feels  A little better today.  Objective: Weight change:   Intake/Output Summary (Last 24 hours) at 12/16/11 1134 Last data filed at 12/16/11 1054  Gross per 24 hour  Intake    490 ml  Output    825 ml  Net   -335 ml    Filed Vitals:   12/16/11 0520  BP: 135/77  Pulse: 86  Temp: 97.6 F (36.4 C)  Resp: 20     Lab Results: reviewed  Micro Results: Recent Results (from the past 240 hour(s))  CULTURE, BLOOD (ROUTINE X 2)     Status: Normal (Preliminary result)   Collection Time   12/15/11 10:15 AM      Component Value Range Status Comment   Specimen Description BLOOD RIGHT FOREARM   Final    Special Requests BOTTLES DRAWN AEROBIC ONLY 3ML   Final    Culture  Setup Time [id number]   Final    Culture     Final    Value:        BLOOD CULTURE RECEIVED NO GROWTH TO DATE CULTURE WILL BE HELD FOR 5 DAYS BEFORE ISSUING A FINAL NEGATIVE REPORT   Report Status PENDING   Incomplete   CULTURE, BLOOD (ROUTINE X 2)     Status: Normal (Preliminary result)   Collection Time   12/15/11 10:20 AM      Component Value Range Status Comment   Specimen Description BLOOD RIGHT HAND   Final    Special Requests BOTTLES DRAWN AEROBIC AND ANAEROBIC 3ML   Final    Culture  Setup Time [id number]   Final    Culture     Final    Value:        BLOOD CULTURE RECEIVED NO GROWTH TO DATE CULTURE WILL BE HELD FOR 5 DAYS BEFORE ISSUING A FINAL NEGATIVE REPORT   Report Status PENDING   Incomplete     Studies/Results: Dg Chest 2 View  12/15/2011  *RADIOLOGY REPORT*  Clinical Data: 66 year old male with cough, shortness of breath. Recent pneumonia.  CHEST - 2 VIEW  Comparison: Chest CT 12/09/2011 and earlier.  Findings: Slightly lower lung volumes.  Stable cardiac size and mediastinal contours.  Progression of mixed interstitial and airspace opacity in the right upper lobe and at both lung bases. The upper lobe has increased the most.  No pneumothorax.  No pleural effusion.  The left  upper lobe remain spared. No acute osseous abnormality identified.  IMPRESSION: Mild radiographic progression of bilateral pneumonia.  Original Report Authenticated By: Randall An, [name]   Ct Angio Chest W/cm &/or Wo Cm  12/09/2011  *RADIOLOGY REPORT*  Clinical Data: Shortness of breath.  History pneumonia.  CT ANGIOGRAPHY CHEST  Technique:  Multidetector CT imaging of the chest using the standard protocol during bolus administration of intravenous contrast. Multiplanar reconstructed images including MIPs were obtained and reviewed to evaluate the vascular anatomy.  Contrast: 140m OMNIPAQUE IOHEXOL 350 MG/ML IV SOLN  Comparison: Plain film chest 11/23/2011.  Findings: No pulmonary embolus is identified.  There is cardiomegaly.  No pleural or pericardial effusion.  There is some lymphadenopathy with a right hilar node on image 54 measuring 1.4 cm short axis dimension.  Precarinal node on image 46 measures 1.6 cm short axis dimension.  Prevascular node on image 39 measures 1.5 cm short axis dimension.  Finally, a right paratracheal node on image 36 measures 1.2 cm short axis dimension.  The lungs demonstrate extensive macrocystic honeycombing extending to the subpleural  space.  Traction bronchiectasis is present. Changes are greater on the right and in the lung bases.  Scattered ground-glass attenuation is noted.  No consolidative process or pneumothorax is seen.  Incidentally imaged upper abdomen shows fatty infiltration of the visualized liver.  Imaged intra-abdominal contents are otherwise unremarkable.  No focal bony abnormality.  IMPRESSION:  1.  Negative for pulmonary embolus. 2.  Interstitial lung disease with an appearance most consistent with usual interstitial pneumonia ("UIP").  Associated hilar and mediastinal lymphadenopathy is noted.  Original Report Authenticated By: Arvid Right. Luther Parody, [name]   Medications: Scheduled Meds:   . albuterol  2.5 mg Nebulization Q6H  . amLODipine  10 mg Oral  Daily  . azithromycin  500 mg Intravenous Once  . azithromycin  500 mg Intravenous Q24H  . enoxaparin (LOVENOX) injection  50 mg Subcutaneous Q24H  . folic acid  0.5 mg Oral Daily  . insulin aspart  0-15 Units Subcutaneous TID WC  . ipratropium  0.5 mg Nebulization Q6H  . levothyroxine  25 mcg Oral QAC breakfast  . metFORMIN  850 mg Oral BID WC  . methylPREDNISolone (SOLU-MEDROL) injection  80 mg Intravenous Q12H  . pantoprazole  40 mg Oral BID AC  . pioglitazone  15 mg Oral BID WC  . piperacillin-tazobactam (ZOSYN)  IV  3.375 g Intravenous Once  . vancomycin  1,000 mg Intravenous Once  . DISCONTD: albuterol  2.5 mg Nebulization Q6H  . DISCONTD: enoxaparin  40 mg Subcutaneous Q24H  . DISCONTD: folic acid  009 mcg Oral Daily  . DISCONTD: levothyroxine  25 mcg Oral Daily  . DISCONTD: methylPREDNISolone (SOLU-MEDROL) injection  40 mg Intravenous Q6H  . DISCONTD: pioglitazone-metformin  1 tablet Oral BID WC   Continuous Infusions:   . sodium chloride 20 mL/hr at 12/15/11 2300   PRN Meds:.acetaminophen, acetaminophen, albuterol, guaiFENesin-dextromethorphan, HYDROmorphone, ondansetron (ZOFRAN) IV, ondansetron  Assessment/Plan: UIP (usual interstitial pneumonitis) (12/15/2011)/  Pneumonia (12/15/2011) Thanks for Pulmonary consult.  Will continue antibiotics, nebs and IV steroid.  DC'd MTX at the time of admission.     HTN (hypertension) (12/15/2011) Fair control, Continue home meds.  Rheumatoid arthritis (12/15/2011) D/cd MTX  Hypothyroidism (12/15/2011) Synthroid.  Disp:  Will await any further recommendation from Pulmonary.       LOS: 1 day   [name] 12/16/2011, 11:34 AM

## 2011-12-16 NOTE — Progress Notes (Signed)
*  PRELIMINARY RESULTS* Echocardiogram 2D Echocardiogram has been performed.  Elvia Collum 12/16/2011, 12:18 PM

## 2011-12-17 DIAGNOSIS — J9600 Acute respiratory failure, unspecified whether with hypoxia or hypercapnia: Secondary | ICD-10-CM

## 2011-12-17 DIAGNOSIS — J84112 Idiopathic pulmonary fibrosis: Secondary | ICD-10-CM

## 2011-12-17 LAB — GLUCOSE, CAPILLARY
Glucose-Capillary: 198 mg/dL — ABNORMAL HIGH (ref 70–99)
Glucose-Capillary: 200 mg/dL — ABNORMAL HIGH (ref 70–99)
Glucose-Capillary: 196 mg/dL — ABNORMAL HIGH (ref 70–99)
Glucose-Capillary: 232 mg/dL — ABNORMAL HIGH (ref 70–99)

## 2011-12-17 MED ORDER — METFORMIN HCL 850 MG PO TABS
850.0000 mg | ORAL_TABLET | Freq: Every day | ORAL | Status: DC
  Administered 2011-12-18: 850 mg via ORAL
  Filled 2011-12-17: qty 1

## 2011-12-17 MED ORDER — PREDNISONE 50 MG PO TABS
60.0000 mg | ORAL_TABLET | Freq: Every day | ORAL | Status: DC
  Administered 2011-12-18: 60 mg via ORAL
  Filled 2011-12-17: qty 1

## 2011-12-17 NOTE — Progress Notes (Signed)
Subjective: He states that he feels much better   Objective: Vital signs in last 24 hours: Filed Vitals:   12/16/11 2113 12/17/11 0134 12/17/11 0545 12/17/11 0831  BP: 113/61  131/71   Pulse: 91  87   Temp: 97.8 F (36.6 C)  97.5 F (36.4 C)   TempSrc: Oral  Oral   Resp: 18  18   Height:      Weight:      SpO2: 96% 95% 98% 97%    Intake/Output Summary (Last 24 hours) at 12/17/11 1314 Last data filed at 12/17/11 0900  Gross per 24 hour  Intake    660 ml  Output      1 ml  Net    659 ml    Weight change:   General: WNWDAAMNAD_0   Neuro: intact  HEENT: Very poor dentation  Cardiovascular: hsr rrr  Lungs: No wheeze. Good air movement  Abdomen: obese +bs  Musculoskeletal: No pedal edema  Skin: Warm and dry no edema   Lab Results: Results for orders placed during the hospital encounter of 12/15/11 (from the past 24 hour(s))  GLUCOSE, CAPILLARY     Status: Abnormal   Collection Time   12/16/11  5:03 PM      Component Value Range   Glucose-Capillary 216 (*) 70 - 99 (mg/dL)  GLUCOSE, CAPILLARY     Status: Abnormal   Collection Time   12/16/11  7:44 PM      Component Value Range   Glucose-Capillary 242 (*) 70 - 99 (mg/dL)   Comment 1 Documented in Chart     Comment 2 Notify RN    GLUCOSE, CAPILLARY     Status: Abnormal   Collection Time   12/17/11  7:47 AM      Component Value Range   Glucose-Capillary 198 (*) 70 - 99 (mg/dL)  GLUCOSE, CAPILLARY     Status: Abnormal   Collection Time   12/17/11 11:53 AM      Component Value Range   Glucose-Capillary 200 (*) 70 - 99 (mg/dL)     Micro: Recent Results (from the past 240 hour(s))  CULTURE, BLOOD (ROUTINE X 2)     Status: Normal (Preliminary result)   Collection Time   12/15/11 10:15 AM      Component Value Range Status Comment   Specimen Description BLOOD RIGHT FOREARM   Final    Special Requests BOTTLES DRAWN AEROBIC ONLY 3ML   Final    Culture  Setup Time [id number]   Final    Culture     Final    Value:        BLOOD CULTURE RECEIVED NO GROWTH TO DATE CULTURE WILL BE HELD FOR 5 DAYS BEFORE ISSUING A FINAL NEGATIVE REPORT   Report Status PENDING   Incomplete   CULTURE, BLOOD (ROUTINE X 2)     Status: Normal (Preliminary result)   Collection Time   12/15/11 10:20 AM      Component Value Range Status Comment   Specimen Description BLOOD RIGHT HAND   Final    Special Requests BOTTLES DRAWN AEROBIC AND ANAEROBIC 3ML   Final    Culture  Setup Time [id number]   Final    Culture     Final    Value:        BLOOD CULTURE RECEIVED NO GROWTH TO DATE CULTURE WILL BE HELD FOR 5 DAYS BEFORE ISSUING A FINAL NEGATIVE REPORT   Report Status PENDING   Incomplete     Studies/Results:  No results found.  Medications:  Scheduled Meds:   . albuterol  2.5 mg Nebulization Q6H  . amLODipine  10 mg Oral Daily  . enoxaparin (LOVENOX) injection  50 mg Subcutaneous Q24H  . folic acid  0.5 mg Oral Daily  . insulin aspart  0-15 Units Subcutaneous TID WC  . ipratropium  0.5 mg Nebulization Q6H  . levothyroxine  25 mcg Oral QAC breakfast  . metFORMIN  850 mg Oral BID WC  . methylPREDNISolone (SOLU-MEDROL) injection  80 mg Intravenous Q12H  . pantoprazole  40 mg Oral BID AC  . pioglitazone  15 mg Oral BID WC  . DISCONTD: azithromycin  500 mg Intravenous Q24H   Continuous Infusions:   . sodium chloride 20 mL/hr at 12/15/11 2300   PRN Meds:.acetaminophen, acetaminophen, albuterol, guaiFENesin-dextromethorphan, HYDROmorphone, ondansetron (ZOFRAN) IV, ondansetron   Assessment:   UIP (usual interstitial pneumonitis) (12/15/2011)/ Pneumonia (12/15/2011)  Thanks for Pulmonary consult. Will continue antibiotics, nebs and IV steroid. DC'd MTX at the time of admission. Steroid taper dosing as per PC CM   HTN (hypertension) (12/15/2011)  Fair control, Continue home meds.   Rheumatoid arthritis (12/15/2011)  D/cd MTX   Hypothyroidism (12/15/2011)  Synthroid.    disposition anticipate discharge tomorrow  after being seen by pccm     LOS: 2 days   [facility] 12/17/2011, 1:14 PM

## 2011-12-17 NOTE — Progress Notes (Signed)
Ambulated pt on 3L/min oxygen and  O2 sats maintained 90-92%. Pt was slightly short of breath, but tolorated walk well. We walked at a fast pace and he still tolerated well and shortness of breath did not increase/change from previous, slower walks. Pt's O2 sats increased quickly, as well as, his shortness of breath improved quickly after sitting to rest after walk.

## 2011-12-17 NOTE — Progress Notes (Signed)
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 He developed increased DOE to were he was SOB walking >15 feet. Normally prior to 1 month  pta he had no limitation of activities(confirmed by family). + for night sweats but no fever, chills or purulent sputum. He went to his PCP Dr. Jillyn Ledger and was dx with Pna and treated with 2 rounds of Avelox and Prednisone without any improvement in sxs. He has a appointment with Dr. Chase Caller of pulmonary but on 2/12 due SOB he presented to [facility] ED and was noted to be hypoxic.  Report to have to have UIP on ct scan. CxR with ?progressive pna and PCCM asked to evaluate  Pm 2/12 dx  PF ? Related to mtx vs RA vs idiopathic     Cultures: 2/12 bc x 2>>  Antibiotics: 2/12 vanc (triad)>> 2/12 2/12 zithro (triad)>> 2/13 2/12 zoysn(triad)>> 2/12  Tests / Events: 2/14 walking sats ok on 3lpm,  86% RA  Vital Signs: Temp:  [97.5 F (36.4 C)-97.8 F (36.6 C)] 97.5 F (36.4 C) (02/14 0545) Pulse Rate:  [87-100] 87  (02/14 0545) Resp:  [18-20] 18  (02/14 0545) BP: (113-131)/(61-71) 131/71 mmHg (02/14 0545) SpO2:  [88 %-100 %] 97 % (02/14 0831) FiO2 (%):  [21 %] 21 % (02/13 2032) I/O last 3 completed shifts: In: 5974 [P.O.:1080; I.V.:240; IV Piggyback:250] Out: 163 [Urine:826]  Physical Examination: Sats 96 on 3lpm at rest General:  WNWDAAMNAD_0  Neuro: intact HEENT:  Very poor dentation Cardiovascular:  hsr rrr Lungs:  No wheeze. Good air movement Abdomen: obese +bs Musculoskeletal:  No pedal edema Skin:  Warm and dry no edema   Labs and Imaging:  No results found.  Lab 12/16/11 0455 12/15/11 1855 12/15/11 0750  NA 136 134* 138  K 4.5 4.5 3.6  CL 103 100 102  CO2 _1 BUN _2 CREATININE 0.87 1.07 0.92  GLUCOSE 241* 290* 126*    Lab  12/16/11 0455 12/15/11 1855 12/15/11 0750  HGB 12.3* 12.2* 13.3  HCT 37.7* 37.1* 39.0  WBC 6.3 4.4 7.0  PLT 253 246 240   ABG    Component Value Date/Time   PHART 7.452* 12/15/2011 0810   PCO2ART 38.4 12/15/2011 0810   PO2ART 74.2* 12/15/2011 0810   HCO3 26.4* 12/15/2011 0810   TCO2 23.4 12/15/2011 0810   O2SAT 94.8 12/15/2011 0810    Assessment and Plan: Hypoic resp failure in 66 yo AAM with recent URI with radigraphic changes non specific in pt with RA on MTX -O2 as needed -BD's -steroids. Wean by changing to prednisone 60 mg, decrease by 10 mg q 5 days to 10 mg and stay at 10 mg  until evaluated by MD. -  Esr/bnp 104/ 40 favors PF, not chf - D/C MTX - the fact his RA is well controlled while resp status is worsening over > one month strongly favors mtx lung over RA lung dz and CAP (d/c all abx 2/13 as per dashboard)    Best practices / Disposition: -->Floor status under triad/ ok for discharge and pulmonary f/u as outpt. -->full code -->LMWH for DVT Px -->Protonix for GI Px -->diet   Richardson Landry Minor ACNP [facility] PCCM Pager [phone] till 3 pm If no answer page [phone]  12/17/2011, 11:27 AM  Pt independently  seen and examined and available cxr's reviewed and I agree with above findings/ imp/ plan  Christinia Gully, MD Pulmonary and [place] [facility] [phone]

## 2011-12-17 NOTE — Progress Notes (Signed)
Pt ambulated in hall >134f on 3L of oxygen. Pt tolerated walk well and had minimal shortness of breath. Pt stated that it was very much improved from before he came to the hospital. Oxygen sats were at 90% on 3L of oxygen while ambulating.

## 2011-12-17 NOTE — Progress Notes (Signed)
Ambulated pt in hallway around entire [place] unit. Pt ambulated well with slight shortness of breath, but he still states that it is very much improved than before he came to the hospital. Before ambulating pt had been on RA for about 10 minutes and O2 sats were 95% at rest, sitting on the side of the bed. Began ambulating pt on RA and O2 sats were ranging from 91-92% for about the first 50 feet. Then O2 sats began to decrease. They dropped as low as 83% on RA while ambulating. Returned pt to room and sat him on the side of the bed. O2 sats increased to 90-91% on RA very quickly with resting. Reapplied oxygen at 3L/min and had pt lay back in bed. O2 sats increased to 94-95% with 3L of oxygen. Will continue to monitor pt.  Othella Boyer [facility] 10:07 AM 12/17/2011

## 2011-12-17 NOTE — Progress Notes (Signed)
Inpatient Diabetes Program Recommendations  AACE/ADA: New Consensus Statement on Inpatient Glycemic Control (2009)  Target Ranges:  Prepandial:   less than 140 mg/dL      Peak postprandial:   less than 180 mg/dL (1-2 hours)      Critically ill patients:  140 - 180 mg/dL   Reason for Visit: Hyperglycemia  Results for [name], [name] (MRN [id number]) as of 12/17/2011 15:35  Ref. Range 12/16/2011 12:17 12/16/2011 17:03 12/16/2011 19:44 12/17/2011 07:47 12/17/2011 11:53  Glucose-Capillary Latest Range: 70-99 mg/dL 254 (H) 216 (H) 242 (H) 198 (H) 200 (H)  Results for [name], [name] (MRN [id number]) as of 12/17/2011 15:35  Ref. Range 12/15/2011 18:55  Hemoglobin A1C Latest Range: <5.7 % 7.8 (H)    Inpatient Diabetes Program Recommendations Insulin - Basal: Add Lantus 10 units QHS Insulin - Meal Coverage: Add Novolog 3 units tidwc while on steroids Outpatient Referral: OP Diabetes Education consult for HgbA1c > 7.  Note: May need adjustment in diabetes meds for home.

## 2011-12-18 LAB — GLUCOSE, CAPILLARY
Glucose-Capillary: 226 mg/dL — ABNORMAL HIGH (ref 70–99)
Glucose-Capillary: 298 mg/dL — ABNORMAL HIGH (ref 70–99)

## 2011-12-18 MED ORDER — ALBUTEROL SULFATE (5 MG/ML) 0.5% IN NEBU: 2.5000 mg | INHALATION_SOLUTION | Freq: Four times a day (QID) | RESPIRATORY_TRACT | Status: DC

## 2011-12-18 MED ORDER — GUAIFENESIN-DM 100-10 MG/5ML PO SYRP: 5.0000 mL | ORAL_SOLUTION | ORAL | Status: DC | PRN

## 2011-12-18 MED ORDER — PANTOPRAZOLE SODIUM 40 MG PO TBEC: 40.0000 mg | DELAYED_RELEASE_TABLET | Freq: Two times a day (BID) | ORAL | Status: DC

## 2011-12-18 MED ORDER — PREDNISONE 5 MG PO TABS: 5.0000 mg | ORAL_TABLET | Freq: Every day | ORAL | Status: DC

## 2011-12-18 NOTE — Progress Notes (Signed)
12-18-11 Spoke with Mr. Sommerfield at bedside regarding dc plans. O2 sats checked and were 82-83% on room air. Will need home 02 set up. Also he can benefit from [facility] RN. States his PCP is Dr Benson [place]. Chose [facility] for RN and for home 02 set up. Spoke with Mrs Habib as well to confirm dc plans. Called Lecretia with [facility] for O2 referral and Levora Dredge with [facility] for RN referral. No further needs assessed. Nursing aware of dc plan as well. Will need o2 and [facility] rn orders. MD text paged for orders please.   [place], [place]  [phone]

## 2011-12-18 NOTE — Discharge Summary (Addendum)
Physician Discharge Summary  [name] MRN: [id number] DOB/AGE: [birth year] 66 y.o.  PCP: Merrilee Seashore, MD, MD   Admit date: 12/15/2011 Discharge date: 12/18/2011  Discharge Diagnoses:    *UIP (usual interstitial pneumonitis) Methotrexate lung  Pneumonia  HTN (hypertension)  Rheumatoid arthritis  Hypothyroidism   Medication List  As of 12/18/2011 11:37 AM   STOP taking these medications         budesonide 0.5 MG/2ML nebulizer solution      methotrexate 2.5 MG tablet         TAKE these medications         albuterol (5 MG/ML) 0.5% nebulizer solution   Commonly known as: PROVENTIL   Take 0.5 mLs (2.5 mg total) by nebulization every 6 (six) hours.      albuterol-ipratropium 18-103 MCG/ACT inhaler   Commonly known as: COMBIVENT   Inhale 2 puffs into the lungs every 6 (six) hours as needed. For shortness of breath      amLODipine 10 MG tablet   Commonly known as: NORVASC   Take 10 mg by mouth daily.      folic acid 277 MCG tablet   Commonly known as: FOLVITE   Take 400 mcg by mouth daily.      guaiFENesin-dextromethorphan 100-10 MG/5ML syrup   Commonly known as: ROBITUSSIN DM   Take 5 mLs by mouth every 4 (four) hours as needed for cough.      levothyroxine 25 MCG tablet   Commonly known as: SYNTHROID, LEVOTHROID   Take 25 mcg by mouth daily.      moxifloxacin 400 MG tablet   Commonly known as: AVELOX   Take 400 mg by mouth daily. For 10 days.      pantoprazole 40 MG tablet   Commonly known as: PROTONIX   Take 1 tablet (40 mg total) by mouth 2 (two) times daily before a meal.      pioglitazone-metformin 15-850 MG per tablet   Commonly known as: ACTOPLUS MET   Take 1 tablet by mouth 2 (two) times daily with a meal.      predniSONE 5 MG tablet   Commonly known as: DELTASONE   Take 1 tablet (5 mg total) by mouth daily.           Lantus 10 units each bedtime  Discharge Condition: Stable Disposition: Final discharge disposition not  confirmed   Consults:Michael Wert, MD   Significant Diagnostic Studies: Dg Chest 2 View  12/15/2011  *RADIOLOGY REPORT*  Clinical Data: 66 year old male with cough, shortness of breath. Recent pneumonia.  CHEST - 2 VIEW  Comparison: Chest CT 12/09/2011 and earlier.  Findings: Slightly lower lung volumes.  Stable cardiac size and mediastinal contours.  Progression of mixed interstitial and airspace opacity in the right upper lobe and at both lung bases. The upper lobe has increased the most.  No pneumothorax.  No pleural effusion.  The left upper lobe remain spared. No acute osseous abnormality identified.  IMPRESSION: Mild radiographic progression of bilateral pneumonia.  Original Report Authenticated By: Randall An, [name]   Ct Angio Chest W/cm &/or Wo Cm  12/09/2011  *RADIOLOGY REPORT*  Clinical Data: Shortness of breath.  History pneumonia.  CT ANGIOGRAPHY CHEST  Technique:  Multidetector CT imaging of the chest using the standard protocol during bolus administration of intravenous contrast. Multiplanar reconstructed images including MIPs were obtained and reviewed to evaluate the vascular anatomy.  Contrast: 171m OMNIPAQUE IOHEXOL 350 MG/ML IV SOLN  Comparison: Plain film chest 11/23/2011.  Findings: No pulmonary embolus is identified.  There is cardiomegaly.  No pleural or pericardial effusion.  There is some lymphadenopathy with a right hilar node on image 54 measuring 1.4 cm short axis dimension.  Precarinal node on image 46 measures 1.6 cm short axis dimension.  Prevascular node on image 39 measures 1.5 cm short axis dimension.  Finally, a right paratracheal node on image 36 measures 1.2 cm short axis dimension.  The lungs demonstrate extensive macrocystic honeycombing extending to the subpleural space.  Traction bronchiectasis is present. Changes are greater on the right and in the lung bases.  Scattered ground-glass attenuation is noted.  No consolidative process or pneumothorax is seen.   Incidentally imaged upper abdomen shows fatty infiltration of the visualized liver.  Imaged intra-abdominal contents are otherwise unremarkable.  No focal bony abnormality.  IMPRESSION:  1.  Negative for pulmonary embolus. 2.  Interstitial lung disease with an appearance most consistent with usual interstitial pneumonia ("UIP").  Associated hilar and mediastinal lymphadenopathy is noted.  Original Report Authenticated By: Arvid Right. Luther Parody, [name]    Microbiology: Recent Results (from the past 240 hour(s))  CULTURE, BLOOD (ROUTINE X 2)     Status: Normal (Preliminary result)   Collection Time   12/15/11 10:15 AM      Component Value Range Status Comment   Specimen Description BLOOD RIGHT FOREARM   Final    Special Requests BOTTLES DRAWN AEROBIC ONLY 3ML   Final    Culture  Setup Time [id number]   Final    Culture     Final    Value:        BLOOD CULTURE RECEIVED NO GROWTH TO DATE CULTURE WILL BE HELD FOR 5 DAYS BEFORE ISSUING A FINAL NEGATIVE REPORT   Report Status PENDING   Incomplete   CULTURE, BLOOD (ROUTINE X 2)     Status: Normal (Preliminary result)   Collection Time   12/15/11 10:20 AM      Component Value Range Status Comment   Specimen Description BLOOD RIGHT HAND   Final    Special Requests BOTTLES DRAWN AEROBIC AND ANAEROBIC 3ML   Final    Culture  Setup Time [id number]   Final    Culture     Final    Value:        BLOOD CULTURE RECEIVED NO GROWTH TO DATE CULTURE WILL BE HELD FOR 5 DAYS BEFORE ISSUING A FINAL NEGATIVE REPORT   Report Status PENDING   Incomplete      Labs: Results for orders placed during the hospital encounter of 12/15/11 (from the past 48 hour(s))  GLUCOSE, CAPILLARY     Status: Abnormal   Collection Time   12/16/11 12:17 PM      Component Value Range Comment   Glucose-Capillary 254 (*) 70 - 99 (mg/dL)   GLUCOSE, CAPILLARY     Status: Abnormal   Collection Time   12/16/11  5:03 PM      Component Value Range Comment   Glucose-Capillary 216 (*) 70 - 99  (mg/dL)   GLUCOSE, CAPILLARY     Status: Abnormal   Collection Time   12/16/11  7:44 PM      Component Value Range Comment   Glucose-Capillary 242 (*) 70 - 99 (mg/dL)    Comment 1 Documented in Chart      Comment 2 Notify RN     GLUCOSE, CAPILLARY     Status: Abnormal   Collection Time   12/17/11  7:47 AM  Component Value Range Comment   Glucose-Capillary 198 (*) 70 - 99 (mg/dL)   GLUCOSE, CAPILLARY     Status: Abnormal   Collection Time   12/17/11 11:53 AM      Component Value Range Comment   Glucose-Capillary 200 (*) 70 - 99 (mg/dL)   GLUCOSE, CAPILLARY     Status: Abnormal   Collection Time   12/17/11  4:44 PM      Component Value Range Comment   Glucose-Capillary 196 (*) 70 - 99 (mg/dL)   GLUCOSE, CAPILLARY     Status: Abnormal   Collection Time   12/17/11  9:34 PM      Component Value Range Comment   Glucose-Capillary 232 (*) 70 - 99 (mg/dL)   GLUCOSE, CAPILLARY     Status: Abnormal   Collection Time   12/18/11  7:23 AM      Component Value Range Comment   Glucose-Capillary 226 (*) 70 - 99 (mg/dL)      HPI :66 yo remote smoker(age 9-30 1 ppd) 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 He developed increased DOE to were he was SOB walking >15 feet. Normally prior to 1 month pta he had no limitation of activities(confirmed by family). + for night sweats but no fever, chills or purulent sputum. He went to his PCP Dr. Jillyn Ledger and was dx with Pna and treated with 2 rounds of Avelox and Prednisone without any improvement in sxs. He has a appointment with Dr. Chase Caller of pulmonary but on 2/12 due SOB he presented to [facility] ED and was noted to be hypoxic. Report to have to have UIP on ct scan. CxR with ?progressive pna and PCCM asked to evaluate Pm 2/12 dx PF ? Related to mtx vs RA vs idiopathic     HOSPITAL COURSE:   #1 hypoxic respiratory failure with a recent URI, nonspecific changes on chest x-ray A. he was thought to have interstitial lung disease but  strongly favors methotrexate lung disease over RA lung. He has been recommended to continue a prednisone taper, and chills he follow up with pulmonary on an outpatient basis. He was treated with IV Solu-Medrol, inhaled steroids, nebulizers during this hospitalization. He was also treated with antibiotics including vancomycin, Zosyn, Zithromax. He will resume his of Avelox upon discharge.  #2 diabetes with A1c of 7.8. Diabetes coordinator has recommended starting Lantus at 10 units and 3 units of NovoLog before each meal  #3 rheumatoid arthritis his methotrexate has been discontinued #4 hypertension stable   Discharge Exam:  Blood pressure 130/69, pulse 80, temperature 97.8 F (36.6 C), temperature source Oral, resp. rate 20, height _0  (1.676 m), weight 102.785 kg (226 lb 9.6 oz), SpO2 96.00%.   General: Patient appears his stated age and does not seem to be in any acute distress.  HEENT: Head normocephalic atraumatic.  Lungs: Decreased breath sounds throughout with some rhonchi.  Cardiovascular: Slightly tachycardic without murmurs rubs or gallops  Abdomen: Positive bowel sounds  Extremities: No edema  Neurologic: Nonfocal.      Discharge Orders    Future Appointments: Provider: Department: Dept Phone: Center:   12/29/2011 10:30 AM Brand Males, MD [facility] [phone] None     Future Orders Please Complete By Expires   Diet - low sodium heart healthy      Increase activity slowly      Call MD for:  difficulty breathing, headache or visual disturbances      Call MD for:  persistant dizziness or light-headedness      Call MD for:  extreme fatigue         Follow-up Information    Schedule an appointment as soon as possible for a visit with Merrilee Seashore, MD.   Contact information:   [address]  [place], [place] [address] [phone]      Schedule an appointment as soon as possible for a visit with Maximino Greenland, MD.   Contact information:   [address] [place], [place] [address]  [phone]        Follow up with [place])    Contact information:   [address] [place],  [address] [phone]        pulmonary Dr. Melvyn Novas in 2 weeks  Signed: Reyne Dumas 12/18/2011, 11:37 AM

## 2011-12-21 LAB — CULTURE, BLOOD (ROUTINE X 2)
Culture  Setup Time: 201302121332
Culture  Setup Time: 201302121332
Culture: NO GROWTH
Culture: NO GROWTH

## 2011-12-24 ENCOUNTER — Telehealth: Payer: Self-pay | Admitting: Internal Medicine

## 2011-12-24 MED ORDER — ALBUTEROL SULFATE (5 MG/ML) 0.5% IN NEBU: 2.5000 mg | INHALATION_SOLUTION | Freq: Four times a day (QID) | RESPIRATORY_TRACT | Status: DC

## 2011-12-24 NOTE — Telephone Encounter (Signed)
Refill sent to last until [facility]fu on 12-29-11. Pt aware. Hayward Bing, CMA

## 2011-12-29 ENCOUNTER — Ambulatory Visit (INDEPENDENT_AMBULATORY_CARE_PROVIDER_SITE_OTHER): Payer: Medicare Other | Admitting: Internal Medicine

## 2011-12-29 ENCOUNTER — Encounter: Payer: Self-pay | Admitting: Internal Medicine

## 2011-12-29 VITALS — BP 140/80 | HR 89 | Temp 98.0°F | Ht 66.0 in | Wt 224.6 lb

## 2011-12-29 DIAGNOSIS — J84112 Idiopathic pulmonary fibrosis: Secondary | ICD-10-CM

## 2011-12-29 DIAGNOSIS — J8410 Pulmonary fibrosis, unspecified: Secondary | ICD-10-CM

## 2011-12-29 NOTE — Patient Instructions (Signed)
You do have lung damage from Rheumatoid - it is called pulmonary fibrosis I am not sure if back in feb 2013 if you had methtotrexate lung pneumonitis or due to virus. It could have been either. So, for now stay off methotrexate Continue prednisone at current dose; [name] to adjust You might need another medication for RA; I will talk to [name] about this For your lungs, you need oxygen 24/7 2L [place] for now and attend pulmonary rehab - nurse will do referral Return in 6 weeks with CT chest and full PFT breathing test Any problems come sooner

## 2011-12-29 NOTE — Progress Notes (Signed)
Subjective:    Patient ID: [name], male    DOB: 1946-01-08, 66 y.o.   MRN: [id number]  HPI  IOV 12/29/2011  66 year male.  reports that he quit smoking about 27 years ago. His smoking use included Cigarettes. He has a 5 pack-year smoking history. He has never used smokeless tobacco. Body mass index is 36.25 kg/(m^2).   OV 12/29/2011 66 year old male with Rheumatoid ARthritis ([name]) for several years. Was being treated with prednsione 23m, methotrexate and folic acid all since diagnosis several years ago. Diagnosis was only in joints. Then 1-2 month ago noticed insidious onset dyspnea but he thinks this was due to 'double pneumonia' (denies dyspnea during xmas 2012 or thanksgving 2012). Says he was diagnosed with pneumonia and a month later feels opd abx did not work and he was admitted 2/12 - 12/20/11 due to worsening dyspnea. 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 He developed increased DOE to were he was SOB walking >15 feet. Normally prior to 1 month age he had no limitation of activities(confirmed by family). + for night sweats but no fever, chills or purulent sputum. He went to his PCP Dr. RJillyn Ledgerand was dx with Pna and treated with 2 rounds of Avelox and Prednisone without any improvement in sxs. He has a appointment with Dr. RChase Callerof pulmonary but on 2/12 due SOB [facility] noted to [facility]. Report to have to have UIP on ct scan. CxR with ?progressive pna and PCCM asked to evaluate Pm 2/12. Assessment Hypoic resp failure in 66yo AAM with recent URI with radigraphic changes non specific in pt with RA on MTX . Plan  O2 as needed, BD's , steroids , -check esr/bnp may need 2 d echo  And D/C MTX with the thought being that " the fact his RA is well controlled while resp status is worsening over > one  month strongly favors mtx lung over RA lung dz and CAP "  Methotrexated stopped but still on prednisone but at 589mper day and asked to keep original pulm appt with me. Currently feels that he has no dyspnea. However, resting pulse ox on  Room air today 12/29/11 shows pulse ox 88% and he is silently hypoxic. REview of records show ESR 48 on 12/15/11, no eosinophilia. ECHO 12/16/11 shows ef 65% with normal RV. CT chest 2/613: shows UIP pattern but also some GGO. BNP < 2.5 on 11/24/11. Creat 19m70mon 11/16/11          Past Medical History  Diagnosis Date  . Hypertension   . Diabetes mellitus   . Arthritis   . Thyroid disease   . Pneumonia      Family History  Problem Relation Age of Onset  . Diabetes Mother   . Stroke Mother      History   Social History  . Marital Status: Married    Spouse Name: N/A    Number of Children: N/A  . Years of Education: N/A   Occupational History  . Not on file.   Social History Main Topics  . Smoking status: Former Smoker -- 0.5 packs/day for 10 years    Types: Cigarettes    Quit date: 12/03/1984  . Smokeless tobacco: Never Used  . Alcohol Use:  No  . Drug Use: No  . Sexually Active: Not Currently   Other Topics Concern  . Not on file   Social History Narrative  . No narrative on file     No Known Allergies   Outpatient Prescriptions Prior to Visit  Medication Sig Dispense Refill  . albuterol (PROVENTIL) (5 MG/ML) 0.5% nebulizer solution Take 0.5 mLs (2.5 mg total) by nebulization every 6 (six) hours.  60 mL  0  . amLODipine (NORVASC) 10 MG tablet Take 10 mg by mouth daily.      . folic acid (FOLVITE) 388 MCG tablet Take 400 mcg by mouth daily.      [name] Kitchen levothyroxine (SYNTHROID, LEVOTHROID) 25 MCG tablet Take 25 mcg by mouth daily.      . pantoprazole (PROTONIX) 40 MG tablet Take 1 tablet (40 mg total) by mouth 2 (two) times daily before a meal.  60 tablet  2  . pioglitazone-metformin (ACTOPLUS MET) 15-850 MG per tablet Take 1 tablet  by mouth 2 (two) times daily with a meal.      . guaiFENesin-dextromethorphan (ROBITUSSIN DM) 100-10 MG/5ML syrup Take 5 mLs by mouth every 4 (four) hours as needed for cough.  250 mL  2  . albuterol-ipratropium (COMBIVENT) 18-103 MCG/ACT inhaler Inhale 2 puffs into the lungs every 6 (six) hours as needed. For shortness of breath      . predniSONE (DELTASONE) 5 MG tablet Take 1 tablet (5 mg total) by mouth daily.  200 tablet  2       Review of Systems  Constitutional: Negative for fever and unexpected weight change.  HENT: Negative for ear pain, nosebleeds, congestion, sore throat, rhinorrhea, sneezing, trouble swallowing, dental problem, postnasal drip and sinus pressure.   Eyes: Negative for redness and itching.  Respiratory: Positive for cough and shortness of breath. Negative for chest tightness and wheezing.   Cardiovascular: Negative for palpitations and leg swelling.  Gastrointestinal: Negative for nausea and vomiting.  Genitourinary: Negative for dysuria.  Musculoskeletal: Negative for joint swelling.  Skin: Negative for rash.  Neurological: Negative for headaches.  Hematological: Does not bruise/bleed easily.  Psychiatric/Behavioral: Negative for dysphoric mood. The patient is not nervous/anxious.        Objective:   Physical Exam  Nursing note and vitals reviewed. Constitutional: [name] oriented to person, place, and time. He appears well-developed and well-nourished. No distress.       Body mass index is 36.25 kg/(m^2).   HENT:  Head: Normocephalic and atraumatic.  Right Ear: External ear normal.  Left Ear: External ear normal.  Mouth/Throat: Oropharynx is clear and moist. No oropharyngeal exudate.  Eyes: Conjunctivae and EOM are normal. Pupils are equal, round, and reactive to light. Right eye exhibits no discharge. Left eye exhibits no discharge. No scleral icterus.  Neck: Normal range of motion. Neck supple. No JVD present. No tracheal deviation present. No  thyromegaly present.  Cardiovascular: Normal rate, regular rhythm and intact distal pulses.  Exam reveals no gallop and no friction rub.   No murmur heard. Pulmonary/Chest: Effort normal. No respiratory distress. He has no wheezes. He has rales. He exhibits no tenderness.  Abdominal: Soft. Bowel sounds are normal. He exhibits no distension and no mass. There is no tenderness. There is no rebound and no guarding.  Musculoskeletal: Normal range of motion. He exhibits no edema and no tenderness.       RA of joints +  Lymphadenopathy:    He has no cervical adenopathy.  Neurological: [name]  alert and oriented to person, place, and time. He has normal reflexes. No cranial nerve deficit. Coordination normal.  Skin: Skin is warm and dry. No rash noted. [name] not diaphoretic. No erythema. No pallor.  Psychiatric: His behavior is normal. Judgment and thought content normal.       Flat affect. Pleasant          Assessment & Plan:

## 2011-12-30 ENCOUNTER — Telehealth: Payer: Self-pay | Admitting: Internal Medicine

## 2011-12-30 DIAGNOSIS — J8410 Pulmonary fibrosis, unspecified: Secondary | ICD-10-CM

## 2011-12-30 NOTE — Telephone Encounter (Signed)
Spoke with pt's spouse. She states that she would like to know if the pt needs a pulse oximeter. I advised that this is the pt's choice- he does not require one, but if he wants one, can purchase this at medical supply store and this does not require script. She verbalized understanding.  Spouse also states that pt wishes to go to pulmonary rehab at [facility] rather than [facility].  I see that [facility] has already sent the order to [facility]. Do we need to place new order for this? Will forward to [facility] for recs. Thanks!

## 2011-12-31 NOTE — Telephone Encounter (Signed)
Left a detailed msg to let the pt know a new order had been placed for pulmonary rehab at [facility] and that he should hear from someone within the next 2-3 weeks. If he has any further questions to please call our office.

## 2011-12-31 NOTE — Telephone Encounter (Signed)
Well i hate to say but yes i need a referral for [facility] pul rehab sorry

## 2012-01-04 ENCOUNTER — Encounter: Payer: Self-pay | Admitting: Internal Medicine

## 2012-01-04 ENCOUNTER — Telehealth: Payer: Self-pay | Admitting: Internal Medicine

## 2012-01-04 NOTE — Assessment & Plan Note (Signed)
I am not sure if he had methotrexate toxicity recently (eos normal). [name] subjectively better anwyways  PLAN You do have lung damage from Rheumatoid - it is called pulmonary fibrosis I am not sure if back in feb 2013 if you had methtotrexate lung pneumonitis or due to virus. It could have been either. So, for now stay off methotrexate Continue prednisone at current dose; [name] to adjust You might need another medication for RA; I will talk to [name] about this For your lungs, you need oxygen 24/7 2L [place] for now and attend pulmonary rehab - nurse will do referral Return in 6 weeks with CT chest and full PFT breathing test Any problems come sooner

## 2012-01-04 NOTE — Telephone Encounter (Signed)
Call [name]

## 2012-01-04 NOTE — Telephone Encounter (Signed)
D/w [name]  Expressed my impression that this could be primary RA relatd pulmonary toxicity   - enbrel would be great but not an option due to cost and medicare  - He agrees for now hold off on methotrexate depending on ct and pft in 6 weeks  - he will consider arava depending on ct results

## 2012-01-07 ENCOUNTER — Other Ambulatory Visit: Payer: Medicare Other

## 2012-01-14 ENCOUNTER — Telehealth: Payer: Self-pay | Admitting: Internal Medicine

## 2012-01-14 ENCOUNTER — Encounter (HOSPITAL_COMMUNITY): Payer: Self-pay

## 2012-01-14 ENCOUNTER — Encounter (HOSPITAL_COMMUNITY)
Admission: RE | Admit: 2012-01-14 | Discharge: 2012-01-14 | Disposition: A | Discharge status: Home / Self Care | Payer: Medicare Other | Source: Ambulatory Visit | Attending: Internal Medicine | Admitting: Internal Medicine

## 2012-01-14 VITALS — BP 118/60 | HR 89 | Ht 66.0 in | Wt 218.0 lb

## 2012-01-14 DIAGNOSIS — R0602 Shortness of breath: Secondary | ICD-10-CM | POA: Insufficient documentation

## 2012-01-14 DIAGNOSIS — J8410 Pulmonary fibrosis, unspecified: Secondary | ICD-10-CM

## 2012-01-14 NOTE — Patient Instructions (Signed)
Orientation completed. Pt is scheduled to start on Monday 01/18/12 at 1:00 pm. Pt is registered and records are requested. Pt is eager to get started.

## 2012-01-14 NOTE — Telephone Encounter (Signed)
I spoke with spouse and she states pt is wanting an order for light weight liquid portable oxygen sent to [facility]. She states pt's current tanks are to heavy to lug around. Please advise MR, thanks

## 2012-01-14 NOTE — Progress Notes (Signed)
During orientation advised patient on arrival and appointment times what to wear, what to do before, during and after exercise. Reviewed attendance and class policy. Talked about inclement weather and class consultation policy. Pt is scheduled to start Pulmonary Rehab on 01/18/12 at 1:00pm. Pt was advised to come to class 5 minutes before class starts. He was also given instructions on meeting with the dietician and attending the Family Structure classes. Pt is eager to get started.  Pt was also instructed on purse lip breathing techniques, educated on equipment safety, and the use of hand hygiene.

## 2012-01-15 NOTE — Telephone Encounter (Signed)
This is fine 

## 2012-01-15 NOTE — Telephone Encounter (Signed)
Order sent to [facility]. LMOM informing Mellody Dance that this was done.

## 2012-01-18 ENCOUNTER — Encounter (HOSPITAL_COMMUNITY): Payer: Medicare Other

## 2012-01-20 ENCOUNTER — Encounter (HOSPITAL_COMMUNITY)
Admission: RE | Admit: 2012-01-20 | Discharge: 2012-01-20 | Disposition: A | Discharge status: Home / Self Care | Payer: Medicare Other | Source: Ambulatory Visit | Attending: Internal Medicine | Admitting: Internal Medicine

## 2012-01-25 ENCOUNTER — Encounter (HOSPITAL_COMMUNITY): Payer: Medicare Other

## 2012-01-27 ENCOUNTER — Encounter (HOSPITAL_COMMUNITY)
Admission: RE | Admit: 2012-01-27 | Discharge: 2012-01-27 | Disposition: A | Discharge status: Home / Self Care | Payer: Medicare Other | Source: Ambulatory Visit | Attending: Internal Medicine | Admitting: Internal Medicine

## 2012-01-28 ENCOUNTER — Ambulatory Visit (INDEPENDENT_AMBULATORY_CARE_PROVIDER_SITE_OTHER): Payer: Medicare Other | Admitting: Internal Medicine

## 2012-01-28 ENCOUNTER — Encounter: Payer: Self-pay | Admitting: Internal Medicine

## 2012-01-28 ENCOUNTER — Ambulatory Visit (INDEPENDENT_AMBULATORY_CARE_PROVIDER_SITE_OTHER)
Admission: RE | Admit: 2012-01-28 | Discharge: 2012-01-28 | Disposition: A | Discharge status: Home / Self Care | Payer: Medicare Other | Source: Ambulatory Visit | Attending: Internal Medicine | Admitting: Internal Medicine

## 2012-01-28 VITALS — BP 140/70 | HR 105 | Temp 98.0°F | Ht 66.0 in | Wt 216.0 lb

## 2012-01-28 DIAGNOSIS — J8410 Pulmonary fibrosis, unspecified: Secondary | ICD-10-CM

## 2012-01-28 LAB — PULMONARY FUNCTION TEST

## 2012-01-28 NOTE — Progress Notes (Signed)
Subjective:    Patient ID: [name], male    DOB: 18-Sep-1946, 66 y.o.   MRN: [id number]  HPI IOV 12/29/2011  66 year male.  reports that he quit smoking about 27 years ago. His smoking use included Cigarettes. He has a 5 pack-year smoking history. He has never used smokeless tobacco. Body mass index is 36.25 kg/(m^2).   OV 12/29/2011 66 year old male with Rheumatoid ARthritis ([name]) for several years. Was being treated with prednsione 29m, methotrexate and folic acid all since diagnosis several years ago. Diagnosis was only in joints. Then 1-2 month ago noticed insidious onset dyspnea but he thinks this was due to 'double pneumonia' (denies dyspnea during xmas 2012 or thanksgving 2012). Says he was diagnosed with pneumonia and a month later feels opd abx did not work and he was admitted 2/12 - 12/20/11 due to worsening dyspnea. 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 He developed increased DOE to were he was SOB walking >15 feet. Normally prior to 1 month age he had no limitation of activities(confirmed by family). + for night sweats but no fever, chills or purulent sputum. He went to his PCP Dr. RJillyn Ledgerand was dx with Pna and treated with 2 rounds of Avelox and Prednisone without any improvement in sxs. He has a appointment with Dr. RChase Callerof pulmonary but [facility] 2[facility]12 [facility] and [facility] noted to be [facility]. Report to have to have UIP on ct scan. CxR with ?progressive pna and PCCM asked to evaluate Pm 2/12. Assessment Hypoic resp failure in 66yo AAM with recent URI with radigraphic changes non specific in pt with RA on MTX . Plan  O2 as needed, BD's , steroids , -check esr/bnp may need 2 d echo  And D/C MTX with the thought being that " the fact his RA is well controlled while resp status is worsening over > one  month strongly favors mtx lung over RA lung dz and CAP "  Methotrexated stopped but still on prednisone but at 527mper day and asked to keep original pulm appt with me. Currently feels that he has no dyspnea. However, resting pulse ox on  Room air today 12/29/11 shows pulse ox 88% and he is silently hypoxic. REview of records show ESR 48 on 12/15/11, no eosinophilia. ECHO 12/16/11 shows ef 65% with normal RV. CT chest 2/613: shows UIP pattern but also some GGO. BNP < 2.5 on 11/24/11. Creat 41m17mon 11/16/11  REC You do have lung damage from Rheumatoid - it is called pulmonary fibrosis  I am not sure if back in feb 2013 if you had methtotrexate lung pneumonitis or due to virus. It could have been either. So, for now stay off methotrexate  Continue prednisone at current dose; [name] adjust  You might need another medication for RA; I will talk to DR BeeAmil Amenout this  For your lungs, you need oxygen 24/7 2L [place] for now and attend pulmonary rehab - nurse will do referral  Return in 6 weeks with CT chest and full PFT breathing test  Any problems come sooner   Tele[hone call 01/04/12 : D/w Dr BeeAmil Amenxpressed my impression that this could be primary RA relatd pulmonary disease   - enbrel would be great but  not an option due to cost and medicare  - He agrees for now hold off on methotrexate depending on ct and pft in 6 weeks  - he will consider arava depending on ct results   OV 01/28/2012  No complaints.Started on [place]. Wants to be able to  go fishing. [facility]arted pulm rehab [facility]   PFTs 01/28/2012  - FVC 2.3L/59%,, Ratop 84, TLC 3.6L/64%, and DLCO 14/60% - c/w Restriction   Ct Chest Wo Contrast  01/28/2012  *RADIOLOGY REPORT*  Clinical Data: Evaluate for pulmonary fibrosis  CT CHEST WITHOUT CONTRAST  Technique:  Multidetector CT imaging of the chest was performed following the standard protocol without IV contrast.  Comparison: 12/09/2011  Findings: Enlarged mediastinal lymph nodes are  identified.  The largest is in the precarinal lymph node which measures 1.3 cm.  No pericardial or pleural effusion.  There are bilateral areas of bronchiectasis.  Most severe in the right lower lobe.  Interstitial reticulation and honeycombing is identified.  Most severe in the right lower lobe.  No airspace consolidation identified.  No suspicious pulmonary nodule or mass.  Review of the visualized osseous structures is significant for mild multilevel degenerative disc disease.  Limited imaging through the upper abdomen is negative.  IMPRESSION:  1.  Exam is positive for pulmonary fibrosis or usual interstitial pneumonitis (UIP). 2.  Enlarged mediastinal lymph nodes are nonspecific in the setting of interstitial lung disease.  Original Report Authenticated By: [name], [name]     Current outpatient prescriptions:albuterol (PROVENTIL) (5 MG/ML) 0.5% nebulizer solution, Take 0.5 mLs (2.5 mg total) by nebulization every 6 (six) hours., Disp: 60 mL, Rfl: 0;  amLODipine (NORVASC) 10 MG tablet, Take 10 mg by mouth daily., Disp: , Rfl: ;  folic acid (FOLVITE) 478 MCG tablet, Take 400 mcg by mouth daily., Disp: , Rfl:  guaiFENesin-dextromethorphan (ROBITUSSIN DM) 100-10 MG/5ML syrup, Take 5 mLs by mouth every 4 (four) hours as needed., Disp: , Rfl: ;  levothyroxine (SYNTHROID, LEVOTHROID) 25 MCG tablet, Take 25 mcg by mouth daily., Disp: , Rfl: ;  pioglitazone-metformin (ACTOPLUS MET) 15-850 MG per tablet, Take 1 tablet by mouth 2 (two) times daily with a meal., Disp: , Rfl: ;  predniSONE (DELTASONE) 1 MG tablet, Take 1 mg by mouth daily., Disp: , Rfl:    Review of Systems  Constitutional: Negative for fever and unexpected weight change.  HENT: Negative for ear pain, nosebleeds, congestion, sore throat, rhinorrhea, sneezing, trouble swallowing, dental problem, postnasal drip and sinus pressure.   Eyes: Negative for redness and itching.  Respiratory: Negative for cough, chest tightness, shortness of  breath and wheezing.   Cardiovascular: Negative for palpitations and leg swelling.  Gastrointestinal: Negative for nausea and vomiting.  Genitourinary: Negative for dysuria.  Musculoskeletal: Negative for joint swelling.  Skin: Negative for rash.  Neurological: Negative for headaches.  Hematological: Does not bruise/bleed easily.  Psychiatric/Behavioral: Negative for dysphoric mood. The patient is not nervous/anxious.        Objective:   Physical Exam Nursing note and vitals reviewed. Constitutional: [name] oriented to person, place, and time. He appears well-developed and well-nourished. No distress.       Body mass index is 36.25 kg/(m^2).   HENT:  Head: Normocephalic and atraumatic.  Right Ear: External ear normal.  Left Ear: External ear normal.  Mouth/Throat: Oropharynx is clear and moist. No oropharyngeal exudate.  Eyes: Conjunctivae and EOM are normal. Pupils are equal, round, and reactive to light. Right eye exhibits no  discharge. Left eye exhibits no discharge. No scleral icterus.  Neck: Normal range of motion. Neck supple. No JVD present. No tracheal deviation present. No thyromegaly present.  Cardiovascular: Normal rate, regular rhythm and intact distal pulses.  Exam reveals no gallop and no friction rub.   No murmur heard. Pulmonary/Chest: Effort normal. No respiratory distress. He has no wheezes. He has rales. He exhibits no tenderness.  Abdominal: Soft. Bowel sounds are normal. He exhibits no distension and no mass. There is no tenderness. There is no rebound and no guarding.  Musculoskeletal: Normal range of motion. He exhibits no edema and no tenderness.       RA of joints +  Lymphadenopathy:    He has no cervical adenopathy.  Neurological: [name] alert and oriented to person, place, and time. He has normal reflexes. No cranial nerve deficit. Coordination normal.  Skin: Skin is warm and dry. No rash noted. [name] not diaphoretic. No erythema. No pallor.  Psychiatric:  His behavior is normal. Judgment and thought content normal.       Flat affect. Pleasant          Assessment & Plan:

## 2012-01-28 NOTE — Patient Instructions (Signed)
You do have lung damage from Rheumatoid - it is called pulmonary fibrosis There is no cure for this disease and can get worse over time Continue prednisone at current dose; [name] to adjust [facility] to start [place]:  I will talk to [name] about this For your lungs, you need oxygen with exertion and sleep but as of today no need to take it at rest  REturn in 6 months  Walk test at followup

## 2012-01-28 NOTE — Progress Notes (Signed)
PFT done today. 

## 2012-02-01 ENCOUNTER — Encounter: Payer: Self-pay | Admitting: Internal Medicine

## 2012-02-01 ENCOUNTER — Encounter (HOSPITAL_COMMUNITY): Payer: Medicare Other

## 2012-02-01 NOTE — Assessment & Plan Note (Signed)
You do have lung damage from Rheumatoid - it is called pulmonary fibrosis There is no cure for this disease and can get worse over time Continue prednisone at current dose; [name] to adjust [facility] to start [place]:  I will talk to [name] about this For your lungs, you need oxygen with exertion and sleep but as of today no need to take it at rest  (his restiing hypoxemia has improved. WE discussed benefits of oxygen Rx in quality of life and potentially longevity) REturn in 6 months   > 50% of this > 15 min visit spent in face to face counseling  Walk test at followup

## 2012-02-03 ENCOUNTER — Encounter (HOSPITAL_COMMUNITY)
Admission: RE | Admit: 2012-02-03 | Discharge: 2012-02-03 | Disposition: A | Discharge status: Home / Self Care | Payer: Medicare Other | Source: Ambulatory Visit | Attending: Internal Medicine | Admitting: Internal Medicine

## 2012-02-03 DIAGNOSIS — J8410 Pulmonary fibrosis, unspecified: Secondary | ICD-10-CM | POA: Insufficient documentation

## 2012-02-03 DIAGNOSIS — R0602 Shortness of breath: Secondary | ICD-10-CM | POA: Insufficient documentation

## 2012-02-08 ENCOUNTER — Encounter (HOSPITAL_COMMUNITY): Payer: Medicare Other

## 2012-02-09 ENCOUNTER — Other Ambulatory Visit: Payer: Medicare Other

## 2012-02-10 ENCOUNTER — Encounter (HOSPITAL_COMMUNITY)
Admission: RE | Admit: 2012-02-10 | Discharge: 2012-02-10 | Disposition: A | Discharge status: Home / Self Care | Payer: Medicare Other | Source: Ambulatory Visit | Attending: Internal Medicine | Admitting: Internal Medicine

## 2012-02-15 ENCOUNTER — Encounter (HOSPITAL_COMMUNITY): Payer: Medicare Other

## 2012-02-17 ENCOUNTER — Encounter (HOSPITAL_COMMUNITY): Payer: Medicare Other

## 2012-02-22 ENCOUNTER — Encounter (HOSPITAL_COMMUNITY): Payer: Medicare Other

## 2012-02-24 ENCOUNTER — Encounter (HOSPITAL_COMMUNITY): Payer: Medicare Other

## 2012-02-29 ENCOUNTER — Encounter (HOSPITAL_COMMUNITY): Payer: Medicare Other

## 2012-03-02 ENCOUNTER — Encounter (HOSPITAL_COMMUNITY): Payer: Medicare Other

## 2012-03-07 ENCOUNTER — Encounter (HOSPITAL_COMMUNITY): Payer: Medicare Other

## 2012-03-09 ENCOUNTER — Encounter (HOSPITAL_COMMUNITY): Payer: Medicare Other

## 2012-03-14 ENCOUNTER — Encounter (HOSPITAL_COMMUNITY): Payer: Medicare Other

## 2012-03-16 ENCOUNTER — Encounter (HOSPITAL_COMMUNITY): Payer: Medicare Other

## 2012-03-21 ENCOUNTER — Encounter (HOSPITAL_COMMUNITY): Payer: Medicare Other

## 2012-03-23 ENCOUNTER — Encounter (HOSPITAL_COMMUNITY): Payer: Medicare Other

## 2012-03-28 ENCOUNTER — Encounter (HOSPITAL_COMMUNITY): Payer: Medicare Other

## 2012-03-30 ENCOUNTER — Encounter (HOSPITAL_COMMUNITY): Payer: Medicare Other

## 2012-04-04 ENCOUNTER — Encounter (HOSPITAL_COMMUNITY): Payer: Medicare Other

## 2012-04-06 ENCOUNTER — Encounter (HOSPITAL_COMMUNITY): Payer: Medicare Other

## 2012-04-11 ENCOUNTER — Encounter (HOSPITAL_COMMUNITY): Payer: Medicare Other

## 2012-04-13 ENCOUNTER — Encounter (HOSPITAL_COMMUNITY): Payer: Medicare Other

## 2012-04-18 ENCOUNTER — Encounter (HOSPITAL_COMMUNITY): Payer: Medicare Other

## 2012-04-20 ENCOUNTER — Encounter (HOSPITAL_COMMUNITY): Payer: Medicare Other

## 2012-04-25 ENCOUNTER — Encounter (HOSPITAL_COMMUNITY): Payer: Medicare Other

## 2012-04-27 ENCOUNTER — Encounter (HOSPITAL_COMMUNITY): Payer: Medicare Other

## 2012-05-02 ENCOUNTER — Encounter (HOSPITAL_COMMUNITY): Payer: Medicare Other

## 2012-05-04 ENCOUNTER — Encounter (HOSPITAL_COMMUNITY): Payer: Medicare Other

## 2012-05-09 ENCOUNTER — Encounter (HOSPITAL_COMMUNITY): Payer: Medicare Other

## 2012-05-11 ENCOUNTER — Encounter (HOSPITAL_COMMUNITY): Payer: Medicare Other

## 2012-05-16 ENCOUNTER — Encounter (HOSPITAL_COMMUNITY): Payer: Medicare Other

## 2012-05-18 ENCOUNTER — Encounter (HOSPITAL_COMMUNITY): Payer: Medicare Other

## 2012-07-26 ENCOUNTER — Ambulatory Visit (INDEPENDENT_AMBULATORY_CARE_PROVIDER_SITE_OTHER): Payer: Medicare Other | Admitting: Internal Medicine

## 2012-07-26 ENCOUNTER — Encounter: Payer: Self-pay | Admitting: Internal Medicine

## 2012-07-26 VITALS — BP 150/80 | HR 87 | Temp 98.4°F | Ht 66.0 in | Wt 224.0 lb

## 2012-07-26 DIAGNOSIS — E669 Obesity, unspecified: Secondary | ICD-10-CM | POA: Insufficient documentation

## 2012-07-26 DIAGNOSIS — J8410 Pulmonary fibrosis, unspecified: Secondary | ICD-10-CM

## 2012-07-26 DIAGNOSIS — E66811 Obesity, class 1: Secondary | ICD-10-CM | POA: Insufficient documentation

## 2012-07-26 NOTE — Assessment & Plan Note (Signed)
#  WEight Mgmt  -  we discussed extensively about weight management   - follow low glycemic diet plan that I outlined for you after extensive discussion  - For breakfast   - recommend steel cut oat meal or fiber one 60 cal (half to one cup) with low sugar 60 cal silk soymilk and some berries and one egg  - snack with berries or low glycemic fruit or mens health 35gm mixed-nut pack  - lunch and dinner - unlimited non-starchy vegetable (prefer raw fresh or roasted or grilled) with skinless chicken or fish   - drink lot of water  - avoid all moderate and high glycemic foods  - measure weight once  A week    > 50% of this > 25 min visit spent in face to face counseling (15 min visit converted to 25 min)

## 2012-07-26 NOTE — Progress Notes (Signed)
Subjective:    Patient ID: [name], male    DOB: Dec 30, 1945, 66 y.o.   MRN: [id number]  HPI IOV 12/29/2011  66 year male.  reports that he quit smoking about 27 years ago. His smoking use included Cigarettes. He has a 5 pack-year smoking history. He has never used smokeless tobacco. Body mass index is 36.25 kg/(m^2).   OV 12/29/2011 66 year old male with Rheumatoid ARthritis ([name]) for several years. Was being treated with prednsione 76m, methotrexate and folic acid all since diagnosis several years ago. Diagnosis was only in joints. Then 1-2 month ago noticed insidious onset dyspnea but he thinks this was due to 'double pneumonia' (denies dyspnea during xmas 2012 or thanksgving 2012). Says he was diagnosed with pneumonia and a month later feels opd abx did not work and he was admitted 2/12 - 12/20/11 due to worsening dyspnea. 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 He developed increased DOE to were he was SOB walking >15 feet. Normally prior to 1 month age he had no limitation of activities(confirmed by family). + for night sweats but no fever, chills or purulent sputum. He went to his PCP Dr. RJillyn Ledgerand was dx with Pna and treated with 2 rounds of Avelox and Prednisone without any improvement in sxs. He has a appointment with Dr. RChase Callerof pulmonary but [facility] 2[facility]. Report to have to have UIP on ct scan. CxR with ?progressive pna and PCCM asked to evaluate Pm 2/12. Assessment Hypoic resp failure in 66yo AAM with recent URI with radigraphic changes non specific in pt with RA on MTX . Plan  O2 as needed, BD's , steroids , -check esr/bnp may need 2 d echo  And D/C MTX with the thought being that " the fact his RA is well controlled while resp status is worsening over > one  month strongly favors mtx lung over RA lung dz and CAP "  Methotrexated stopped but still on prednisone but at 567mper day and asked to keep original pulm appt with me. Currently feels that he has no dyspnea. However, resting pulse ox on  Room air today 12/29/11 shows pulse ox 88% and he is silently hypoxic. REview of records show ESR 48 on 12/15/11, no eosinophilia. ECHO 12/16/11 shows ef 65% with normal RV. CT chest 2/613: shows UIP pattern but also some GGO. BNP < 2.5 on 11/24/11. Creat 17m58mon 11/16/11  REC You do have lung damage from Rheumatoid - it is called pulmonary fibrosis  I am not sure if back in feb 2013 if you had methtotrexate lung pneumonitis or due to virus. It could have been either. So, for now stay off methotrexate  Continue prednisone at current dose; [name] adjust  You might need another medication for RA; I will talk to DR BeeAmil Amenout this  For your lungs, you need oxygen 24/7 2L [place] for now and attend pulmonary rehab - nurse will do referral  Return in 6 weeks with CT chest and full PFT breathing test  Any problems come sooner   Telephone call 01/04/12 : D/w Dr BeeAmil Amenxpressed my impression that this could be primary RA relatd pulmonary disease   - enbrel would be great but  not an option due to cost and medicare  - He agrees for now hold off on methotrexate depending on ct and pft in 6 weeks  - he will consider arava depending on ct results   OV 01/28/2012  No complaints.Started on [place]. Wants to be able to  go fishing. [facility]arted pulm rehab at [facility]   PFTs 01/28/2012  - FVC 2.3L/59%,, Ratop 84, TLC 3.6L/64%, and DLCO 14/60% - c/w Restriction   Ct Chest Wo Contrast  01/28/2012  *RADIOLOGY REPORT*  Clinical Data: Evaluate for pulmonary fibrosis  CT CHEST WITHOUT CONTRAST  Technique:  Multidetector CT imaging of the chest was performed following the standard protocol without IV contrast.  Comparison: 12/09/2011  Findings: Enlarged mediastinal lymph nodes are  identified.  The largest is in the precarinal lymph node which measures 1.3 cm.  No pericardial or pleural effusion.  There are bilateral areas of bronchiectasis.  Most severe in the right lower lobe.  Interstitial reticulation and honeycombing is identified.  Most severe in the right lower lobe.  No airspace consolidation identified.  No suspicious pulmonary nodule or mass.  Review of the visualized osseous structures is significant for mild multilevel degenerative disc disease.  Limited imaging through the upper abdomen is negative.  IMPRESSION:  1.  Exam is positive for pulmonary fibrosis or usual interstitial pneumonitis (UIP). 2.  Enlarged mediastinal lymph nodes are nonspecific in the setting of interstitial lung disease.  Original Report Authenticated By: [name], [name]   REC You do have lung damage from Rheumatoid - it is called pulmonary fibrosis  There is no cure for this disease and can get worse over time  Continue prednisone at current dose; [name] to adjust  [facility] to start [place]: I will talk to [name] about this  For your lungs, you need oxygen with exertion and sleep but as of today no need to take it at rest  REturn in 6 months  Walk test at followup   OV 07/26/2012 Followup Pulmonary fibrosis on basis of RA. Presents with wife. Overall well. , Not doing rehab: no benefit. Has not had flu shot but will have it. Wants walk test. [name] concerned about overall health and his obesity. His eating habits are very poor. Diet is largely moderate and high glycemic foods involving bread, corn, fried foods and potatoes. He and wife do want to lose weight. They want to start at [facility] but have no dietary plan. In terms of RA, tolerating arava and prednisone directed by [name]  Past, Family, Social reviewed: no change since last visit   Review of Systems  Constitutional: Negative for fever and unexpected weight change.  HENT: Negative for ear pain, nosebleeds, congestion, sore  throat, rhinorrhea, sneezing, trouble swallowing, dental problem, postnasal drip and sinus pressure.   Eyes: Negative for redness and itching.  Respiratory: Positive for wheezing. Negative for cough, chest tightness and shortness of breath.   Cardiovascular: Negative for palpitations and leg swelling.  Gastrointestinal: Negative for nausea and vomiting.  Genitourinary: Negative for dysuria.  Musculoskeletal: Negative for joint swelling.  Skin: Negative for rash.  Neurological: Negative for headaches.  Hematological: Does not bruise/bleed easily.  Psychiatric/Behavioral: Negative for dysphoric mood. The patient is not nervous/anxious.        Objective:   Physical Exam Nursing note and vitals reviewed. Constitutional: [name] oriented to person, place, and time. He appears well-developed and well-nourished. No distress.  Body mass index is 36.15 kg/(m^2).  HENT:  Head: Normocephalic and atraumatic.  Right Ear: External ear normal.  Left Ear: External ear normal.  Mouth/Throat: Oropharynx is clear and moist. No oropharyngeal exudate.  Eyes: Conjunctivae and EOM are normal. Pupils are equal, round, and reactive to light. Right eye exhibits no discharge. Left eye exhibits no discharge. No scleral icterus.  Neck: Normal range of motion. Neck supple. No JVD present. No tracheal deviation present. No thyromegaly present.  Cardiovascular: Normal rate, regular rhythm and intact distal pulses.  Exam reveals no gallop and no friction rub.   No murmur heard. Pulmonary/Chest: Effort normal. No respiratory distress. He has no wheezes. He has rales. He exhibits no tenderness.  Abdominal: Soft. Bowel sounds are normal. He exhibits no distension and no mass. There is no tenderness. There is no rebound and no guarding.  Musculoskeletal: Normal range of motion. He exhibits no edema and no tenderness.       RA of joints +  Lymphadenopathy:    He has no cervical adenopathy.  Neurological: [name] alert  and oriented to person, place, and time. He has normal reflexes. No cranial nerve deficit. Coordination normal.  Skin: Skin is warm and dry. No rash noted. [name] not diaphoretic. No erythema. No pallor.  Psychiatric: His behavior is normal. Judgment and thought content normal.       Flat affect. Pleasant         Assessment & Plan:

## 2012-07-26 NOTE — Assessment & Plan Note (Signed)
#  Pulmonary Fibrosis - Clinically stable - Have flu shot today 07/26/2012 - Next pneumonia shot is in 2014 (because you had last one in 2009) - Walk test today 07/26/2012 - continue oxygen with sleep and exertion  - Join [employer] - Continue Rheumatoid medications per [name] #Folowup  - 9 months or sooner  - PFT at followup   5 min face to face discussion on this tpic

## 2012-07-26 NOTE — Patient Instructions (Addendum)
#  Pulmonary Fibrosis - Clinically stable - Have flu shot today 07/26/2012 - Next pneumonia shot is in 2014 (because you had last one in 2009) - Walk test today 07/26/2012 - continue oxygen with sleep and exertion  - Join [employer] - Continue Rheumatoid medications per [name]  #WEight Mgmt  -  we discussed extensively about weight management   - follow low glycemic diet plan that I outlined for you after extensive discussion  - For breakfast   - recommend steel cut oat meal or fiber one 60 cal (half to one cup) with low sugar 60 cal silk soymilk and some berries and one egg  - snack with berries or low glycemic fruit or mens health 35gm mixed-nut pack  - lunch and dinner - unlimited non-starchy vegetable (prefer raw fresh or roasted or grilled) with skinless chicken or fish   - drink lot of water  - avoid all moderate and high glycemic foods  - measure weight once  A week    #Folowup  - 9 months or sooner  - PFT at followup

## 2013-01-12 ENCOUNTER — Other Ambulatory Visit: Payer: Self-pay | Admitting: Podiatry

## 2013-02-01 ENCOUNTER — Other Ambulatory Visit: Payer: Self-pay | Admitting: Internal Medicine

## 2013-02-01 DIAGNOSIS — R7989 Other specified abnormal findings of blood chemistry: Secondary | ICD-10-CM

## 2013-02-07 ENCOUNTER — Ambulatory Visit
Admission: RE | Admit: 2013-02-07 | Discharge: 2013-02-07 | Disposition: A | Discharge status: Home / Self Care | Payer: Medicare Other | Source: Ambulatory Visit | Attending: Internal Medicine | Admitting: Internal Medicine

## 2013-02-07 DIAGNOSIS — R7989 Other specified abnormal findings of blood chemistry: Secondary | ICD-10-CM

## 2013-04-25 ENCOUNTER — Encounter: Payer: Self-pay | Admitting: Internal Medicine

## 2013-04-25 ENCOUNTER — Ambulatory Visit (INDEPENDENT_AMBULATORY_CARE_PROVIDER_SITE_OTHER): Payer: Medicare Other | Admitting: Internal Medicine

## 2013-04-25 VITALS — BP 138/70 | HR 90 | Temp 98.2°F | Ht 66.0 in | Wt 217.0 lb

## 2013-04-25 DIAGNOSIS — J8410 Pulmonary fibrosis, unspecified: Secondary | ICD-10-CM

## 2013-04-25 LAB — PULMONARY FUNCTION TEST

## 2013-04-25 NOTE — Patient Instructions (Addendum)
#  Worsening COugh  - do RSI cough score sheet before you leave and give to nurse  -  take generic fluticasone inhaler 2 squirts each nostril daily x 2 months - take OCT prilsec daily x 2 months  #Pulmonary Fibrosis  - UIP due to RA - Clinically stable versus mild proigression - continue prednisone - - continue oxygen with sleep and exertion  -  Continue Rheumatoid medications per [name] - do spirometr in office at followup - depending on course will consider CT chest and cellcept medication at followup  #FOlllwup  2 months Spirometry in office at followup

## 2013-04-25 NOTE — Progress Notes (Signed)
PFT done today. [place], CMA

## 2013-04-25 NOTE — Assessment & Plan Note (Signed)
#  Worsening COugh  -concerned worsening cough could represent ILD progression but PFTs is equivocal and no associated dyspnea worsening to convincingly state that cough is worse due to ILD progresion. However, he denies associtaed sinus or gerd.  IN any event, will try empiric nasal steroid and PPI for sinus and gerd related cough. If this does not work, then will have to consider UIP/ILD progression and consider cellcept in conjunction with [name]  Therefore plan   -  take generic fluticasone inhaler 2 squirts each nostril daily x 2 months - take OCT prilsec daily x 2 months - for now continue prednisone for RA per [name] - - continue oxygen with sleep and exertion  -  Continue Rheumatoid medications per [name] -  depending on course will consider CT chest and cellcept medication at followup  #FOlllwup  2 months Spirometry in office at followup   > 50% of this > 25 min visit spent in face to face counseling (15 min visit converted to 25 min)

## 2013-04-25 NOTE — Progress Notes (Signed)
Subjective:    Patient ID: [name], male    DOB: January 11, 1946, 67 y.o.   MRN: [id number]  HPI IOV 12/29/2011  67 year male.  reports that he quit smoking about 27 years ago. His smoking use included Cigarettes. He has a 5 pack-year smoking history. He has never used smokeless tobacco. Body mass index is 36.25 kg/(m^2).   OV 12/29/2011 67 year old male with Rheumatoid ARthritis ([name]) for several years. Was being treated with prednsione 59m, methotrexate and folic acid all since diagnosis several years ago. Diagnosis was only in joints. Then 1-2 month ago noticed insidious onset dyspnea but he thinks this was due to 'double pneumonia' (denies dyspnea during xmas 2012 or thanksgving 2012). Says he was diagnosed with pneumonia and a month later feels opd abx did not work and he was admitted 2/12 - 12/20/11 due to worsening dyspnea. 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 He developed increased DOE to were he was SOB walking >15 feet. Normally prior to 1 month age he had no limitation of activities(confirmed by family). + for night sweats but no fever, chills or purulent sputum. He went to his PCP Dr. RJillyn Ledgerand was dx with Pna and treated with 2 rounds of Avelox and Prednisone without any improvement in sxs. He has a appointment with Dr. RChase Callerof pulmonary but [facility] noted to be [facility]. Report to have to have UIP on ct scan. CxR with ?progressive pna and PCCM asked to evaluate Pm 2/12. Assessment Hypoic resp failure in 67yo AAM with recent URI with radigraphic changes non specific in pt with RA on MTX . Plan  O2 as needed, BD's , steroids , -check esr/bnp may need 2 d echo  And D/C MTX with the thought being that " the fact his RA is well controlled while resp status is worsening over > one  month strongly favors mtx lung over RA lung dz and CAP "  Methotrexated stopped but still on prednisone but at 533mper day and asked to keep original pulm appt with me. Currently feels that he has no dyspnea. However, resting pulse ox on  Room air today 12/29/11 shows pulse ox 88% and he is silently hypoxic. REview of records show ESR 48 on 12/15/11, no eosinophilia. ECHO 12/16/11 shows ef 65% with normal RV. CT chest 2/613: shows UIP pattern but also some GGO. BNP < 2.5 on 11/24/11. Creat 39m72mon 11/16/11  REC You do have lung damage from Rheumatoid - it is called pulmonary fibrosis  I am not sure if back in feb 2013 if you had methtotrexate lung pneumonitis or due to virus. It could have been either. So, for now stay off methotrexate  Continue prednisone at current dose; [name] adjust  You might need another medication for RA; I will talk to DR BeeAmil Amenout this  For your lungs, you need oxygen 24/7 2L [place] for now and attend pulmonary rehab - nurse will do referral  Return in 6 weeks with CT chest and full PFT breathing test  Any problems come sooner   Telephone call 01/04/12 : D/w Dr BeeAmil Amenxpressed my impression that this could be primary RA relatd pulmonary disease   - enbrel would be great but  not an option due to cost and medicare  - He agrees for now hold off on methotrexate depending on ct and pft in 6 weeks  - he will consider arava depending on ct results   OV 01/28/2012  No complaints.Started on [place]. Wants to be able to  go fishing. [facility]   PFTs 01/28/2012  - FVC 2.3L/59%,, Ratop 84, TLC 3.6L/64%, and DLCO 14/60% - c/w Restriction   Ct Chest Wo Contrast  01/28/2012  *RADIOLOGY REPORT*  Clinical Data: Evaluate for pulmonary fibrosis  CT CHEST WITHOUT CONTRAST  Technique:  Multidetector CT imaging of the chest was performed following the standard protocol without IV contrast.  Comparison: 12/09/2011  Findings: Enlarged mediastinal lymph nodes are  identified.  The largest is in the precarinal lymph node which measures 1.3 cm.  No pericardial or pleural effusion.  There are bilateral areas of bronchiectasis.  Most severe in the right lower lobe.  Interstitial reticulation and honeycombing is identified.  Most severe in the right lower lobe.  No airspace consolidation identified.  No suspicious pulmonary nodule or mass.  Review of the visualized osseous structures is significant for mild multilevel degenerative disc disease.  Limited imaging through the upper abdomen is negative.  IMPRESSION:  1.  Exam is positive for pulmonary fibrosis or usual interstitial pneumonitis (UIP). 2.  Enlarged mediastinal lymph nodes are nonspecific in the setting of interstitial lung disease.  Original Report Authenticated By: [name], [name]   REC You do have lung damage from Rheumatoid - it is called pulmonary fibrosis  There is no cure for this disease and can get worse over time  Continue prednisone at current dose; [name] to adjust  [facility] to start [place]: I will talk to [name] about this  For your lungs, you need oxygen with exertion and sleep but as of today no need to take it at rest  REturn in 6 months  Walk test at followup   OV 07/26/2012 Followup Pulmonary fibrosis on basis of RA. Presents with wife. Overall well. , Not doing rehab: no benefit. Has not had flu shot but will have it. Wants walk test. [name] concerned about overall health and his obesity. His eating habits are very poor. Diet is largely moderate and high glycemic foods involving bread, corn, fried foods and potatoes. He and wife do want to lose weight. They want to start at [facility] but have no dietary plan. In terms of RA, tolerating arava and prednisone directed by [name]  Past, Family, Social reviewed: no change since last visit  #Pulmonary Fibrosis - Clinically stable - Have flu shot today  - Next pneumonia shot is in 2014 (because you had last one in 2009) - Walk test  today - continue oxygen with sleep and exertion  - Join [employer] - Continue Rheumatoid medications per [name]  #WEight Mgmt  -  we discussed extensively about weight management   - follow low glycemic diet plan that I outlined for you after extensive discussion  - For breakfast   - recommend steel cut oat meal or fiber one 60 cal (half to one cup) with low sugar 60 cal silk soymilk and some berries and one egg  - snack with berries or low glycemic fruit or mens health 35gm mixed-nut pack  - lunch and dinner - unlimited non-starchy vegetable (prefer raw fresh or roasted or grilled) with skinless chicken or fish   - drink lot of water  -  avoid all moderate and high glycemic foods  - measure weight once  A week    #Folowup  - 9 months or sooner  - PFT at followup   OV 04/25/2013  Overall well. Present swith wife.   Main issue, Last 6 months, insidious worsneing of dry cough. Is still mild to moderate. Random. No aggravating or releiving factors. Day and nnight. No associated GERD or sinus or wheeze. Denies ACE inhibitor intake. Asssociated dyspnea is mild and class 2 and is stable/unchanged. [name] s/p rehab and is  Currently on prednisone for RA. Says joint doisease is asymptomatic and stable     In termerms of PFT; FVC worse but DLCO unchanged   PFTs 01/28/2012:  FVC 2.3L/59%,, Ratop 84, TLC 3.6L/64%, and DLCO 14/60% - c/w Restriction  PFT  04/25/13: FVC 2.1L/62%, Ratio 85/110%, TLC 3.47L/55%, DLCO 17.2/63%  In terms of weight loss  - 6# weigh tloss in 9 months; inttentional    Current outpatient prescriptions:amLODipine (NORVASC) 10 MG tablet, Take 10 mg by mouth daily., Disp: , Rfl: ;  folic acid (FOLVITE) 539 MCG tablet, Take 400 mcg by mouth daily., Disp: , Rfl: ;  levothyroxine (SYNTHROID, LEVOTHROID) 25 MCG tablet, Take 25 mcg by mouth daily., Disp: , Rfl: ;  pioglitazone-metformin (ACTOPLUS MET) 15-850 MG per tablet, Take 1 tablet by mouth 2 (two) times daily with a meal.,  Disp: , Rfl:  predniSONE (DELTASONE) 1 MG tablet, Take 5 mg by mouth daily. , Disp: , Rfl:    Review of Systems  Constitutional: Negative.   HENT: Negative.   Eyes: Negative.   Respiratory: Positive for cough and shortness of breath. Negative for apnea, choking, chest tightness, wheezing and stridor.   Cardiovascular: Negative.   Gastrointestinal: Negative.   Endocrine: Negative.   Genitourinary: Negative.   Musculoskeletal: Positive for myalgias, back pain, joint swelling, arthralgias and gait problem.  Allergic/Immunologic: Negative.   Neurological: Negative.   Hematological: Negative.   Psychiatric/Behavioral: Negative.        Objective:   Physical Exam Nursing note and vitals reviewed. Constitutional: [name] oriented to person, place, and time. He appears well-developed and well-nourished. No distress.  Body mass index is 36.15 kg/(m^2). -sept 2013 Body mass index is 35.04 kg/(m^2). on 04/25/2013     HENT:  Head: Normocephalic and atraumatic.  Right Ear: External ear normal.  Left Ear: External ear normal.  Mouth/Throat: Oropharynx is clear and moist. No oropharyngeal exudate.  Eyes: Conjunctivae and EOM are normal. Pupils are equal, round, and reactive to light. Right eye exhibits no discharge. Left eye exhibits no discharge. No scleral icterus.  Neck: Normal range of motion. Neck supple. No JVD present. No tracheal deviation present. No thyromegaly present.  Cardiovascular: Normal rate, regular rhythm and intact distal pulses.  Exam reveals no gallop and no friction rub.   No murmur heard. Pulmonary/Chest: Effort normal. No respiratory distress. He has no wheezes. He has rales. He exhibits no tenderness.  Abdominal: Soft. Bowel sounds are normal. He exhibits no distension and no mass. There is no tenderness. There is no rebound and no guarding.  Musculoskeletal: Normal range of motion. He exhibits no edema and no tenderness.       RA of joints +  Lymphadenopathy:    He  has no cervical adenopathy.  Neurological: [name] alert and oriented to person, place, and time. He has normal reflexes. No cranial nerve deficit. Coordination normal.  Skin: Skin is warm and dry. No rash noted. [name]  not diaphoretic. No erythema. No pallor.  Psychiatric: His behavior is normal. Judgment and thought content normal.       Flat affect. Pleasant        Assessment & Plan:

## 2013-06-28 ENCOUNTER — Encounter: Payer: Self-pay | Admitting: Internal Medicine

## 2013-06-28 ENCOUNTER — Ambulatory Visit (INDEPENDENT_AMBULATORY_CARE_PROVIDER_SITE_OTHER): Payer: Medicare Other | Admitting: Internal Medicine

## 2013-06-28 VITALS — BP 140/70 | HR 88 | Temp 98.3°F | Ht 66.0 in | Wt 225.0 lb

## 2013-06-28 DIAGNOSIS — R059 Cough, unspecified: Secondary | ICD-10-CM

## 2013-06-28 DIAGNOSIS — R053 Chronic cough: Secondary | ICD-10-CM

## 2013-06-28 DIAGNOSIS — R05 Cough: Secondary | ICD-10-CM

## 2013-06-28 DIAGNOSIS — J8410 Pulmonary fibrosis, unspecified: Secondary | ICD-10-CM

## 2013-06-28 NOTE — Patient Instructions (Addendum)
#  Cough  - could be from nasal drainage, lung scarring and lisinopril  - because this is mild, no change in current therapy  - continue generic fluticasone inhaler 2 squirts each nostril daily x 2 months - continue OTC prilsec daily x 2 months  #Pulmonary Fibrosis  - UIP due to RA - Clinically stable - continue prednisone and immuran per [name] - - continue oxygen with sleep and exertion  -high dose flu shot in fall    #FOlllwup  6 months Spirometry in office at followup Walk test at followup

## 2013-06-28 NOTE — Progress Notes (Signed)
Subjective:    Patient ID: [name], male    DOB: 06-Dec-1945, 67 y.o.   MRN: [id number]  HPI IOV 12/29/2011  67 year male.  reports that he quit smoking about 27 years ago. His smoking use included Cigarettes. He has a 5 pack-year smoking history. He has never used smokeless tobacco. Body mass index is 36.25 kg/(m^2).   OV 12/29/2011 67 year old male with Rheumatoid ARthritis ([name]) for several years. Was being treated with prednsione 53m, methotrexate and folic acid all since diagnosis several years ago. Diagnosis was only in joints. Then 1-2 month ago noticed insidious onset dyspnea but he thinks this was due to 'double pneumonia' (denies dyspnea during xmas 2012 or thanksgving 2012). Says he was diagnosed with pneumonia and a month later feels opd abx did not work and he was admitted 2/12 - 12/20/11 due to worsening dyspnea. 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 He developed increased DOE to were he was SOB walking >15 feet. Normally prior to 1 month age he had no limitation of activities(confirmed by family). + for night sweats but no fever, chills or purulent sputum. He went to his PCP Dr. RJillyn Ledgerand was dx with Pna and treated with 2 rounds of Avelox and Prednisone without any improvement in sxs. He has a appointment with Dr. RChase Callerof pulmonary but on 2/12 due SOB [facility]. Report to have to have UIP on ct scan. CxR with ?progressive pna and PCCM asked to evaluate Pm 2/12. Assessment Hypoic resp failure in 67yo AAM with recent URI with radigraphic changes non specific in pt with RA on MTX . Plan  O2 as needed, BD's , steroids , -check esr/bnp may need 2 d echo  And D/C MTX with the thought being that " the fact his RA is well controlled while resp status is worsening over > one  month strongly favors mtx lung over RA lung dz and CAP "  Methotrexated stopped but still on prednisone but at 568mper day and asked to keep original pulm appt with me. Currently feels that he has no dyspnea. However, resting pulse ox on  Room air today 12/29/11 shows pulse ox 88% and he is silently hypoxic. REview of records show ESR 48 on 12/15/11, no eosinophilia. ECHO 12/16/11 shows ef 65% with normal RV. CT chest 2/613: shows UIP pattern but also some GGO. BNP < 2.5 on 11/24/11. Creat 37m24mon 11/16/11  REC You do have lung damage from Rheumatoid - it is called pulmonary fibrosis  I am not sure if back in feb 2013 if you had methtotrexate lung pneumonitis or due to virus. It could have been either. So, for now stay off methotrexate  Continue prednisone at current dose; [name] adjust  You might need another medication for RA; I will talk to DR BeeAmil Amenout this  For your lungs, you need oxygen 24/7 2L [place] for now and attend pulmonary rehab - nurse will do referral  Return in 6 weeks with CT chest and full PFT breathing test  Any problems come sooner   Telephone call 01/04/12 : D/w Dr BeeAmil Amenxpressed my impression that this could be primary RA relatd pulmonary disease   - enbrel would be great but  not an option due to cost and medicare  - He agrees for now hold off on methotrexate depending on ct and pft in 6 weeks  - he will consider arava depending on ct results   OV 01/28/2012  No complaints.Started on [place]. Wants to be able to  go fishing. [facility]ed pul[facility]hab [facility]   PFTs 01/28/2012  - FVC 2.3L/59%,, Ratop 84, TLC 3.6L/64%, and DLCO 14/60% - c/w Restriction   Ct Chest Wo Contrast  01/28/2012  *RADIOLOGY REPORT*  Clinical Data: Evaluate for pulmonary fibrosis  CT CHEST WITHOUT CONTRAST  Technique:  Multidetector CT imaging of the chest was performed following the standard protocol without IV contrast.  Comparison: 12/09/2011  Findings: Enlarged mediastinal lymph nodes are  identified.  The largest is in the precarinal lymph node which measures 1.3 cm.  No pericardial or pleural effusion.  There are bilateral areas of bronchiectasis.  Most severe in the right lower lobe.  Interstitial reticulation and honeycombing is identified.  Most severe in the right lower lobe.  No airspace consolidation identified.  No suspicious pulmonary nodule or mass.  Review of the visualized osseous structures is significant for mild multilevel degenerative disc disease.  Limited imaging through the upper abdomen is negative.  IMPRESSION:  1.  Exam is positive for pulmonary fibrosis or usual interstitial pneumonitis (UIP). 2.  Enlarged mediastinal lymph nodes are nonspecific in the setting of interstitial lung disease.  Original Report Authenticated By: [name], [name]   REC You do have lung damage from Rheumatoid - it is called pulmonary fibrosis  There is no cure for this disease and can get worse over time  Continue prednisone at current dose; [name] to adjust  [facility] to start [place]: I will talk to [name] about this  For your lungs, you need oxygen with exertion and sleep but as of today no need to take it at rest  REturn in 6 months  Walk test at followup   OV 07/26/2012 Followup Pulmonary fibrosis on basis of RA. Presents with wife. Overall well. , Not doing rehab: no benefit. Has not had flu shot but will have it. Wants walk test. [name] concerned about overall health and his obesity. His eating habits are very poor. Diet is largely moderate and high glycemic foods involving bread, corn, fried foods and potatoes. He and wife do want to lose weight. They want to start at [facility] but have no dietary plan. In terms of RA, tolerating arava and prednisone directed by [name]  Past, Family, Social reviewed: no change since last visit  #Pulmonary Fibrosis - Clinically stable - Have flu shot today  - Next pneumonia shot is in 2014 (because you had last one in 2009) - Walk test  today - continue oxygen with sleep and exertion  - Join [employer] - Continue Rheumatoid medications per [name]  #WEight Mgmt  -  we discussed extensively about weight management   - follow low glycemic diet plan that I outlined for you after extensive discussion  - For breakfast   - recommend steel cut oat meal or fiber one 60 cal (half to one cup) with low sugar 60 cal silk soymilk and some berries and one egg  - snack with berries or low glycemic fruit or mens health 35gm mixed-nut pack  - lunch and dinner - unlimited non-starchy vegetable (prefer raw fresh or roasted or grilled) with skinless chicken or fish   - drink lot of water  -  avoid all moderate and high glycemic foods  - measure weight once  A week    #Folowup  - 9 months or sooner  - PFT at followup   OV 04/25/2013  Overall well. Present swith wife.   Main issue, Last 6 months, insidious worsneing of dry cough. Is still mild to moderate. Random. No aggravating or releiving factors. Day and nnight. No associated GERD or sinus or wheeze. Denies ACE inhibitor intake. Asssociated dyspnea is mild and class 2 and is stable/unchanged. [name] s/p rehab and is  Currently on prednisone for RA. Says joint doisease is asymptomatic and stable     In termerms of PFT; FVC worse but DLCO unchanged   PFTs 01/28/2012:  FVC 2.3L/59%,, Ratop 84, TLC 3.6L/64%, and DLCO 14/60% - c/w Restriction  PFT  04/25/13: FVC 2.1L/62%, Ratio 85/110%, TLC 3.47L/55%, DLCO 17.2/63%  In terms of weight loss  - 6# weigh tloss in 9 months; inttentional    Current outpatient prescriptions:amLODipine (NORVASC) 10 MG tablet, Take 10 mg by mouth daily., Disp: , Rfl: ;  folic acid (FOLVITE) 678 MCG tablet, Take 400 mcg by mouth daily., Disp: , Rfl: ;  levothyroxine (SYNTHROID, LEVOTHROID) 25 MCG tablet, Take 25 mcg by mouth daily., Disp: , Rfl: ;  pioglitazone-metformin (ACTOPLUS MET) 15-850 MG per tablet, Take 1 tablet by mouth 2 (two) times daily with a meal.,  Disp: , Rfl:  predniSONE (DELTASONE) 1 MG tablet, Take 5 mg by mouth daily. , Disp: , Rfl:     #Worsening COugh  - do RSI cough score sheet before you leave and give to nurse  - take generic fluticasone inhaler 2 squirts each nostril daily x 2 months  - take OCT prilsec daily x 2 months  #Pulmonary Fibrosis - UIP due to RA  - Clinically stable versus mild proigression  - continue prednisone  - - continue oxygen with sleep and exertion  - Continue Rheumatoid medications per [name]  - do spirometr in office at followup  - depending on course will consider CT chest and cellcept medication at followup  #FOlllwup  2 months  Spirometry in office at followup    OV 06/28/2013   FU  - RA with UIP-ILD featrures. Prior ADR with Methotrexate - chronic cough  Now on imuran since last month along iwht prior prednisone.  Reports dyspnea stable. Last week had joint flare up and took prednisone burst. Overall stable health. No new issues. WEight stable. Continues o2 with sleep and exertion. COntinue PPI  In terms of cough: mild, dry, early in AM, very tolerable and not boethered by it. NOw we did med calendar and noted ACE inhibitor; he is diabetic. Reports cough is so mild does not bother him at all. Continues PPI and nasal steroid. [name] not that keen about dc of ace inhibitor  . Past, Family, Social reviewed: no change since last visit    Review of Systems  Constitutional: Negative for fever and unexpected weight change.  HENT: Negative for ear pain, nosebleeds, congestion, sore throat, rhinorrhea, sneezing, trouble swallowing, dental problem, postnasal drip and sinus pressure.   Eyes: Negative for redness and itching.  Respiratory: Positive for cough and shortness of breath. Negative for chest tightness and wheezing.   Cardiovascular: Negative for palpitations and leg swelling.  Gastrointestinal: Negative for nausea and vomiting.  Genitourinary: Negative for dysuria.   Musculoskeletal: Negative for joint swelling.  Skin: Negative for rash.  Neurological: Negative for headaches.  Hematological: Does not bruise/bleed easily.  Psychiatric/Behavioral: Negative for dysphoric mood. The patient is not nervous/anxious.       Current outpatient prescriptions:amLODipine (NORVASC) 10 MG tablet, Take 10 mg by mouth daily., Disp: , Rfl: ;  aspirin 81 MG tablet, Take 81 mg by mouth daily., Disp: , Rfl: ;  azaTHIOprine (IMURAN) 50 MG tablet, Take 2 in am and 1 in pm, Disp: , Rfl: ;  folic acid (FOLVITE) 974 MCG tablet, Take 400 mcg by mouth daily., Disp: , Rfl: ;  LEVEMIR FLEXTOUCH 100 UNIT/ML SOPN, Inject 26 Units as directed daily., Disp: , Rfl:  levothyroxine (SYNTHROID, LEVOTHROID) 25 MCG tablet, Take 25 mcg by mouth daily., Disp: , Rfl: ;  lisinopril (PRINIVIL,ZESTRIL) 20 MG tablet, Take 1 tablet by mouth daily., Disp: , Rfl: ;  pioglitazone-metformin (ACTOPLUS MET) 15-850 MG per tablet, Take 1 tablet by mouth 2 (two) times daily with a meal., Disp: , Rfl: ;  predniSONE (DELTASONE) 1 MG tablet, Take 3 mg by mouth daily. , Disp: , Rfl:   Objective:   Physical Exam Nursing note and vitals reviewed. Constitutional: [name] oriented to person, place, and time. He appears well-developed and well-nourished. No distress.  Body mass index is 36.15 kg/(m^2). -sept 2013 Body mass index is 35.04 kg/(m^2). on 04/25/2013 Body mass index is 36.33 kg/(m^2).      HENT:  Head: Normocephalic and atraumatic.  Right Ear: External ear normal.  Left Ear: External ear normal.  Mouth/Throat: Oropharynx is clear and moist. No oropharyngeal exudate.  Eyes: Conjunctivae and EOM are normal. Pupils are equal, round, and reactive to light. Right eye exhibits no discharge. Left eye exhibits no discharge. No scleral icterus.  Neck: Normal range of motion. Neck supple. No JVD present. No tracheal deviation present. No thyromegaly present.  Cardiovascular: Normal rate, regular rhythm and intact  distal pulses.  Exam reveals no gallop and no friction rub.   No murmur heard. Pulmonary/Chest: Effort normal. No respiratory distress. He has no wheezes. He has rales. He exhibits no tenderness.  Abdominal: Soft. Bowel sounds are normal. He exhibits no distension and no mass. There is no tenderness. There is no rebound and no guarding.  Musculoskeletal: Normal range of motion. He exhibits no edema and no tenderness.       RA of joints +  Lymphadenopathy:    He has no cervical adenopathy.  Neurological: [name] alert and oriented to person, place, and time. He has normal reflexes. No cranial nerve deficit. Coordination normal.  Skin: Skin is warm and dry. No rash noted. [name] not diaphoretic. No erythema. No pallor.  Psychiatric: His behavior is normal. Judgment and thought content normal.       Flat affect. Pleasant         Assessment & Plan:

## 2013-07-01 DIAGNOSIS — R053 Chronic cough: Secondary | ICD-10-CM | POA: Insufficient documentation

## 2013-07-01 DIAGNOSIS — R05 Cough: Secondary | ICD-10-CM | POA: Insufficient documentation

## 2013-07-01 NOTE — Assessment & Plan Note (Signed)
#  Cough  - could be from nasal drainage, lung scarring and lisinopril  - because this is mild, no change in current therapy  - continue generic fluticasone inhaler 2 squirts each nostril daily x 2 months - continue OTC prilsec daily x 2 months

## 2013-07-01 NOTE — Assessment & Plan Note (Signed)
#  Pulmonary Fibrosis  - UIP due to RA - Clinically stable - continue prednisone and immuran per [name] - - continue oxygen with sleep and exertion  -high dose flu shot in fall    #FOlllwup  6 months Spirometry in office at followup Walk test at followup

## 2013-12-27 ENCOUNTER — Ambulatory Visit: Payer: Medicare Other

## 2013-12-27 ENCOUNTER — Ambulatory Visit: Payer: Medicare Other | Admitting: Internal Medicine

## 2014-01-18 ENCOUNTER — Ambulatory Visit (INDEPENDENT_AMBULATORY_CARE_PROVIDER_SITE_OTHER): Payer: Medicare Other | Admitting: Internal Medicine

## 2014-01-18 ENCOUNTER — Encounter: Payer: Self-pay | Admitting: Internal Medicine

## 2014-01-18 ENCOUNTER — Ambulatory Visit: Payer: Medicare Other

## 2014-01-18 VITALS — BP 140/80 | HR 90 | Ht 67.0 in | Wt 220.0 lb

## 2014-01-18 DIAGNOSIS — R059 Cough, unspecified: Secondary | ICD-10-CM

## 2014-01-18 DIAGNOSIS — R05 Cough: Secondary | ICD-10-CM

## 2014-01-18 DIAGNOSIS — J8410 Pulmonary fibrosis, unspecified: Secondary | ICD-10-CM

## 2014-01-18 DIAGNOSIS — R053 Chronic cough: Secondary | ICD-10-CM

## 2014-01-18 DIAGNOSIS — K029 Dental caries, unspecified: Secondary | ICD-10-CM | POA: Insufficient documentation

## 2014-01-18 NOTE — Assessment & Plan Note (Signed)
#  Pulmonary Fibrosis  - UIP due to RA - Clinically could be worse - continue prednisone and immuran per [name] - - continue oxygen with sleep and exertion - High Resolution CT chest without contrast on ILD protocol. Only  Dr Lorin Picket or Dr. Vinnie Langton to read - do Full PFT - do walk test     #FOlllwup  Return to see me after above

## 2014-01-18 NOTE — Assessment & Plan Note (Signed)
Explained health esp pulmonary infectiin risks of caries: advised to see dentist

## 2014-01-18 NOTE — Progress Notes (Signed)
Subjective:    Patient ID: [name], male    DOB: 10-21-1946, 68 y.o.   MRN: [id number]  HPI  OV 12/29/2011  68 year male.  reports that he quit smoking about 27 years ago. His smoking use included Cigarettes. He has a 5 pack-year smoking history. He has never used smokeless tobacco. Body mass index is 36.25 kg/(m^2).   OV 12/29/2011 68 year old male with Rheumatoid ARthritis ([name]) for several years. Was being treated with prednsione 39m, methotrexate and folic acid all since diagnosis several years ago. Diagnosis was only in joints. Then 1-2 month ago noticed insidious onset dyspnea but he thinks this was due to 'double pneumonia' (denies dyspnea during xmas 2012 or thanksgving 2012). Says he was diagnosed with pneumonia and a month later feels opd abx did not work and he was admitted 2/12 - 12/20/11 due to worsening dyspnea. 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 He developed increased DOE to were he was SOB walking >15 feet. Normally prior to 1 month age he had no limitation of activities(confirmed by family). + for night sweats but no fever, chills or purulent sputum. He went to his PCP Dr. RJillyn Ledgerand was dx with Pna and treated with 2 rounds of Avelox and Prednisone without any improvement in sxs. He has a appointment with Dr. RChase Callerof pulmonary but [facility] 2/12 due SOB [facility] and was noted to be hypoxic. Report to have to have UIP on ct scan. CxR with ?progressive pna and PCCM asked to evaluate Pm 2/12. Assessment Hypoic resp failure in 68yo AAM with recent URI with radigraphic changes non specific in pt with RA on MTX . Plan  O2 as needed, BD's , steroids , -check esr/bnp may need 2 d echo  And D/C MTX with the thought being that " the fact his RA is well controlled while resp status is worsening over > one  month strongly favors mtx lung over RA lung dz and CAP "  Methotrexated stopped but still on prednisone but at 563mper day and asked to keep original pulm appt with me. Currently feels that he has no dyspnea. However, resting pulse ox on  Room air today 12/29/11 shows pulse ox 88% and he is silently hypoxic. REview of records show ESR 48 on 12/15/11, no eosinophilia. ECHO 12/16/11 shows ef 65% with normal RV. CT chest 2/613: shows UIP pattern but also some GGO. BNP < 2.5 on 11/24/11. Creat 30m84mon 11/16/11  REC You do have lung damage from Rheumatoid - it is called pulmonary fibrosis  I am not sure if back in feb 2013 if you had methtotrexate lung pneumonitis or due to virus. It could have been either. So, for now stay off methotrexate  Continue prednisone at current dose; [name] adjust  You might need another medication for RA; I will talk to DR BeeAmil Amenout this  For your lungs, you need oxygen 24/7 2L [place] for now and attend pulmonary rehab - nurse will do referral  Return in 6 weeks with CT chest and full PFT breathing test  Any problems come sooner   Telephone call 01/04/12 : D/w Dr BeeAmil Amenxpressed my impression that this could be primary RA relatd pulmonary disease   - enbrel would be  great but not an option due to cost and medicare  - He agrees for now hold off on methotrexate depending on ct and pft in 6 weeks  - he will consider arava depending on ct results   OV 01/28/2012  No complaints.Started on [place]. Wants to be able to  go fishing. STarted pulm rehab at [facility]   PFTs 01/28/2012  - FVC 2.3L/59%,, Ratop 84, TLC 3.6L/64%, and DLCO 14/60% - c/w Restriction   Ct Chest Wo Contrast  01/28/2012  *RADIOLOGY REPORT*  Clinical Data: Evaluate for pulmonary fibrosis  CT CHEST WITHOUT CONTRAST  Technique:  Multidetector CT imaging of the chest was performed following the standard protocol without IV contrast.  Comparison: 12/09/2011  Findings: Enlarged mediastinal lymph nodes are  identified.  The largest is in the precarinal lymph node which measures 1.3 cm.  No pericardial or pleural effusion.  There are bilateral areas of bronchiectasis.  Most severe in the right lower lobe.  Interstitial reticulation and honeycombing is identified.  Most severe in the right lower lobe.  No airspace consolidation identified.  No suspicious pulmonary nodule or mass.  Review of the visualized osseous structures is significant for mild multilevel degenerative disc disease.  Limited imaging through the upper abdomen is negative.  IMPRESSION:  1.  Exam is positive for pulmonary fibrosis or usual interstitial pneumonitis (UIP). 2.  Enlarged mediastinal lymph nodes are nonspecific in the setting of interstitial lung disease.  Original Report Authenticated By: [name], [name]   REC You do have lung damage from Rheumatoid - it is called pulmonary fibrosis  There is no cure for this disease and can get worse over time  Continue prednisone at current dose; [name] to adjust  [place] to start [place]: I will talk to [name] about this  For your lungs, you need oxygen with exertion and sleep but as of today no need to take it at rest  REturn in 6 months  Walk test at followup   OV 07/26/2012 Followup Pulmonary fibrosis on basis of RA. Presents with wife. Overall well. , Not doing rehab: no benefit. Has not had flu shot but will have it. Wants walk test. [name] concerned about overall health and his obesity. His eating habits are very poor. Diet is largely moderate and high glycemic foods involving bread, corn, fried foods and potatoes. He and wife do want to lose weight. They want to start at [facility] but have no dietary plan. In terms of RA, tolerating arava and prednisone directed by [name]  Past, Family, Social reviewed: no change since last visit  #Pulmonary Fibrosis - Clinically stable - Have flu shot today  - Next pneumonia shot is in 2014 (because you had last one in 2009) - Walk test  today - continue oxygen with sleep and exertion  - Join [employer] - Continue Rheumatoid medications per [name]  #WEight Mgmt  -  we discussed extensively about weight management   - follow low glycemic diet plan that I outlined for you after extensive discussion  - For breakfast   - recommend steel cut oat meal or fiber one 60 cal (half to one cup) with low sugar 60 cal silk soymilk and some berries and one egg  - snack with berries or low glycemic fruit or mens health 35gm mixed-nut pack  - lunch and dinner - unlimited non-starchy vegetable (prefer raw fresh or roasted or grilled) with skinless chicken or fish   - drink lot of  water  - avoid all moderate and high glycemic foods  - measure weight once  A week    #Folowup  - 9 months or sooner  - PFT at followup   OV 04/25/2013  Overall well. Present swith wife.   Main issue, Last 6 months, insidious worsneing of dry cough. Is still mild to moderate. Random. No aggravating or releiving factors. Day and nnight. No associated GERD or sinus or wheeze. Denies ACE inhibitor intake. Asssociated dyspnea is mild and class 2 and is stable/unchanged. [name] s/p rehab and is  Currently on prednisone for RA. Says joint doisease is asymptomatic and stable   In termerms of PFT; FVC worse but DLCO unchanged   PFTs 01/28/2012:  FVC 2.3L/59%,, Ratop 84, TLC 3.6L/64%, and DLCO 14/60% - c/w Restriction  PFT  04/25/13: FVC 2.1L/62%, Ratio 85/110%, TLC 3.47L/55%, DLCO 17.2/63%  In terms of weight loss  - 6# weigh tloss in 9 months; inttentional  #Cough  - could be from nasal drainage, lung scarring and lisinopril  - because this is mild, no change in current therapy  - continue generic fluticasone inhaler 2 squirts each nostril daily x 2 months - continue OTC prilsec daily x 2 months  #Pulmonary Fibrosis  - UIP due to RA - Clinically stable - continue prednisone and immuran per [name] - - continue oxygen with sleep and exertion  -high dose flu  shot in fall    #FOlllwup  6 months Spirometry in office at followup Walk test at followup  OV 01/18/2014  Chief Complaint  Patient presents with  . Follow-up    pt is on abx for URI from PCP. he is currently on 5 mg twice daily for this as well.    FU Mulitfactorial chronic cough, UIP due to RA, ex smoker. HEre with wife  Cough: improved  UIP due to RA: According to him his dyspnea stable but his wife says that a few weeks ago he started getting more dyspneic. More dyspnea than baseline. Insidious worsening. It was progressive. Is no associated cough or phlegm. 4 days ago he saw his primary care physician Dr. Ashby Dawes and was given some antibiotics which then has resolved his dyspnea back to baseline. Currently he is dyspneic just walking from the front door to the rash area with the trash can. Dyspnea is relieved by rest. Associated cough is minimal. There is no associated chest pain, orthopnea, paroxysmal nocturnal dyspnea, edema, hemoptysis . He continues his Imuran and prednisone compliantly. Last CT scan of the chest was in 2013 March  Obesity: Discontinued lose weight intentionally by another 5 pounds since last visit  Smoking history:   reports that he quit smoking about 29 years ago. His smoking use included Cigarettes. He has a 5 pack-year smoking history. He has never used smokeless tobacco. Last CT scan of the chest was in March 2013. No further CT scans of the chest especially  with cancer screening   Review of Systems  Constitutional: Negative for fever and unexpected weight change.  HENT: Negative for congestion, dental problem, ear pain, nosebleeds, postnasal drip, rhinorrhea, sinus pressure, sneezing, sore throat and trouble swallowing.   Eyes: Negative for redness and itching.  Respiratory: Positive for cough and shortness of breath. Negative for chest tightness and wheezing.   Cardiovascular: Negative for palpitations and leg swelling.  Gastrointestinal:  Negative for nausea and vomiting.  Genitourinary: Negative for dysuria.  Musculoskeletal: Negative for joint swelling.  Skin: Negative for rash.  Neurological: Negative  for headaches.  Hematological: Does not bruise/bleed easily.  Psychiatric/Behavioral: Negative for dysphoric mood. The patient is not nervous/anxious.        Objective:   Physical Exam  onstitutional: [name] oriented to person, place, and time. He appears well-developed and well-nourished. No distress.  Body mass index is 36.15 kg/(m^2). -sept 2013 Body mass index is 35.04 kg/(m^2). on 04/25/2013 Body mass index is 36.33 kg/(m^2).      HENT: POOR DENITITION Head: Normocephalic and atraumatic.  Right Ear: External ear normal.  Left Ear: External ear normal.  Mouth/Throat: Oropharynx is clear and moist. No oropharyngeal exudate.  Eyes: Conjunctivae and EOM are normal. Pupils are equal, round, and reactive to light. Right eye exhibits no discharge. Left eye exhibits no discharge. No scleral icterus.  Neck: Normal range of motion. Neck supple. No JVD present. No tracheal deviation present. No thyromegaly present.  Cardiovascular: Normal rate, regular rhythm and intact distal pulses.  Exam reveals no gallop and no friction rub.   No murmur heard. Pulmonary/Chest: Effort normal. No respiratory distress. He has no wheezes. He has rales. He exhibits no tenderness.  Abdominal: Soft. Bowel sounds are normal. He exhibits no distension and no mass. There is no tenderness. There is no rebound and no guarding.  Musculoskeletal: Normal range of motion. He exhibits no edema and no tenderness.       RA of joints +  Lymphadenopathy:    He has no cervical adenopathy.  Neurological: [name] alert and oriented to person, place, and time. He has normal reflexes. No cranial nerve deficit. Coordination normal.  Skin: Skin is warm and dry. No rash noted. [name] not diaphoretic. No erythema. No pallor.  Psychiatric: His behavior is normal.  Judgment and thought content normal.       Flat affect. Pleasant          Assessment & Plan:

## 2014-01-18 NOTE — Patient Instructions (Signed)
#  Pulmonary Fibrosis  - UIP due to RA - Clinically could be worse - continue prednisone and immuran per [name] - - continue oxygen with sleep and exertion - High Resolution CT chest without contrast on ILD protocol. Only  Dr Lorin Picket or Dr. Vinnie Langton to read - do Full PFT - do walk test  #Poor dentition  -  Please see a dentist ASAP   #FOlllwup  Return to see me after above

## 2014-01-23 ENCOUNTER — Ambulatory Visit (INDEPENDENT_AMBULATORY_CARE_PROVIDER_SITE_OTHER)
Admission: RE | Admit: 2014-01-23 | Discharge: 2014-01-23 | Disposition: A | Discharge status: Home / Self Care | Payer: Medicare Other | Source: Ambulatory Visit | Attending: Internal Medicine | Admitting: Internal Medicine

## 2014-01-23 ENCOUNTER — Ambulatory Visit (INDEPENDENT_AMBULATORY_CARE_PROVIDER_SITE_OTHER): Payer: Medicare Other | Admitting: Internal Medicine

## 2014-01-23 DIAGNOSIS — J8410 Pulmonary fibrosis, unspecified: Secondary | ICD-10-CM

## 2014-01-23 LAB — PULMONARY FUNCTION TEST
DL/VA % pred: 118 %
DL/VA: 5.12 ml/min/mmHg/L
DLCO unc % pred: 66 %
DLCO unc: 17.93 ml/min/mmHg
FEF 25-75 Post: 2.56 L/sec
FEF 25-75 Pre: 2.03 L/sec
FEF2575-%Change-Post: 26 %
FEF2575-%Pred-Post: 117 %
FEF2575-%Pred-Pre: 93 %
FEV1-%Change-Post: 4 %
FEV1-%Pred-Post: 79 %
FEV1-%Pred-Pre: 76 %
FEV1-Post: 1.97 L
FEV1-Pre: 1.88 L
FEV1FVC-%Change-Post: 1 %
FEV1FVC-%Pred-Pre: 112 %
FEV6-%Change-Post: 2 %
FEV6-%Pred-Post: 72 %
FEV6-%Pred-Pre: 70 %
FEV6-Post: 2.24 L
FEV6-Pre: 2.18 L
FEV6FVC-%Pred-Post: 105 %
FEV6FVC-%Pred-Pre: 105 %
FVC-%Change-Post: 2 %
FVC-%Pred-Post: 68 %
FVC-%Pred-Pre: 66 %
FVC-Post: 2.24 L
FVC-Pre: 2.18 L
Post FEV1/FVC ratio: 88 %
Post FEV6/FVC ratio: 100 %
Pre FEV1/FVC ratio: 86 %
Pre FEV6/FVC Ratio: 100 %
RV % pred: 68 %
RV: 1.51 L
TLC % pred: 68 %
TLC: 4.28 L

## 2014-01-23 NOTE — Progress Notes (Signed)
PFT done today. 

## 2014-01-24 ENCOUNTER — Other Ambulatory Visit: Payer: Medicare Other

## 2014-01-28 ENCOUNTER — Telehealth: Payer: Self-pay | Admitting: Internal Medicine

## 2014-01-28 NOTE — Telephone Encounter (Signed)
Ct chest march 2015  IMPRESSION: 1. The appearance of the chest is again compatible with underlying interstitial lung disease, and although the distribution is slightly atypical, the overall appearance is most compatible with usual interstitial pneumonia (UIP). Disease has progressed compared to the prior examination. 2. Atherosclerosis, including left anterior descending coronary artery disease. Please note that although the presence of coronary artery calcium documents the presence of coronary artery disease, the severity of this disease and any potential stenosis cannot be assessed on this non-gated CT examination. Assessment for potential risk factor modification, dietary therapy or pharmacologic therapy may be warranted, if clinically indicated.   Electronically Signed By: Vinnie Langton [name] On: 01/24/2014 00:37   Seeing  Him 02/01/14; will discuss at fu   Dr. Brand Males, [name], [facility].C.P Pulmonary and Critical Care Medicine Staff Physician [facility] Pager: [phone], If no answer or between  15:00h - 7:00h: call [phone]  01/28/2014 11:58 PM

## 2014-02-01 ENCOUNTER — Encounter: Payer: Self-pay | Admitting: Internal Medicine

## 2014-02-01 ENCOUNTER — Ambulatory Visit (INDEPENDENT_AMBULATORY_CARE_PROVIDER_SITE_OTHER): Payer: Medicare Other | Admitting: Internal Medicine

## 2014-02-01 VITALS — BP 132/72 | HR 84 | Ht 66.0 in | Wt 216.8 lb

## 2014-02-01 DIAGNOSIS — I2584 Coronary atherosclerosis due to calcified coronary lesion: Secondary | ICD-10-CM

## 2014-02-01 DIAGNOSIS — J8410 Pulmonary fibrosis, unspecified: Secondary | ICD-10-CM

## 2014-02-01 DIAGNOSIS — I2510 Atherosclerotic heart disease of native coronary artery without angina pectoris: Secondary | ICD-10-CM

## 2014-02-01 NOTE — Patient Instructions (Addendum)
#  Pulmonary Fibrosis  - UIP due to RA - stable on breathing test PFT for 2 year but scan says you are worse - I would overall consider your disease as stable - continue prednisone and immuran per [name] - - continue oxygen with sleep and exertion - do Full PFT in 8 months; importance of serial monitoring emphasized  #Coronary artery calcification on CT chest  - see Dr Einar Gip cardiologist  #Poor dentition  -  Please see a dentist    #FOlllwup  Return to see me in 8 months Full PFT at time of followp in 8 months

## 2014-02-01 NOTE — Progress Notes (Signed)
Subjective:    Patient ID: [name], male    DOB: Feb 23, 1946, 68 y.o.   MRN: [id number]  HPI   OV 12/29/2011  68 year male.  reports that he quit smoking about 27 years ago. His smoking use included Cigarettes. He has a 5 pack-year smoking history. He has never used smokeless tobacco. Body mass index is 36.25 kg/(m^2).   OV 12/29/2011 68 year old male with Rheumatoid ARthritis ([name]) for several years. Was being treated with prednsione 87m, methotrexate and folic acid all since diagnosis several years ago. Diagnosis was only in joints. Then 1-2 month ago noticed insidious onset dyspnea but he thinks this was due to 'double pneumonia' (denies dyspnea during xmas 2012 or thanksgving 2012). Says he was diagnosed with pneumonia and a month later feels opd abx did not work and he was admitted 2/12 - 12/20/11 due to worsening dyspnea. 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 He developed increased DOE to were he was SOB walking >15 feet. Normally prior to 1 month age he had no limitation of activities(confirmed by family). + for night sweats but no fever, chills or purulent sputum. He went to his PCP Dr. RJillyn Ledgerand was dx with Pna and treated with 2 rounds of Avelox and Prednisone without any improvement in sxs. He has a appointment with Dr. RChase Callerof pulmonary but [facility] 2[facility]12 due SOB [facility] and [facility] noted to be hypoxic. Report to have to have UIP on ct scan. CxR with ?progressive pna and PCCM asked to evaluate Pm 2/12. Assessment Hypoic resp failure in 68yo AAM with recent URI with radigraphic changes non specific in pt with RA on MTX . Plan  O2 as needed, BD's , steroids , -check esr/bnp may need 2 d echo  And D/C MTX with the thought being that " the fact his RA is well controlled while resp status is worsening over > one  month strongly favors mtx lung over RA lung dz and CAP "  Methotrexated stopped but still on prednisone but at 5432mper day and asked to keep original pulm appt with me. Currently feels that he has no dyspnea. However, resting pulse ox on  Room air today 12/29/11 shows pulse ox 88% and he is silently hypoxic. REview of records show ESR 48 on 12/15/11, no eosinophilia. ECHO 12/16/11 shows ef 65% with normal RV. CT chest 2/613: shows UIP pattern but also some GGO. BNP < 2.5 on 11/24/11. Creat 32m70mon 11/16/11  REC You do have lung damage from Rheumatoid - it is called pulmonary fibrosis  I am not sure if back in feb 2013 if you had methtotrexate lung pneumonitis or due to virus. It could have been either. So, for now stay off methotrexate  Continue prednisone at current dose; [name] adjust  You might need another medication for RA; I will talk to DR BeeAmil Amenout this  For your lungs, you need oxygen 24/7 2L [place] for now and attend pulmonary rehab - nurse will do referral  Return in 6 weeks with CT chest and full PFT breathing test  Any problems come sooner   Telephone call 01/04/12 : D/w Dr BeeAmil Amenxpressed my impression that this could be primary RA relatd pulmonary disease   - enbrel would  be great but not an option due to cost and medicare  - He agrees for now hold off on methotrexate depending on ct and pft in 6 weeks  - he will consider arava depending on ct results   OV 01/28/2012  No complaints.Started on [place]. Wants to be able to  go fishing. STarted pulm rehab at [facility]   PFTs 01/28/2012  - FVC 2.3L/59%,, Ratop 84, TLC 3.6L/64%, and DLCO 14/60% - c/w Restriction   Ct Chest Wo Contrast  01/28/2012  *RADIOLOGY REPORT*  Clinical Data: Evaluate for pulmonary fibrosis  CT CHEST WITHOUT CONTRAST  Technique:  Multidetector CT imaging of the chest was performed following the standard protocol without IV contrast.  Comparison: 12/09/2011  Findings: Enlarged mediastinal lymph nodes are  identified.  The largest is in the precarinal lymph node which measures 1.3 cm.  No pericardial or pleural effusion.  There are bilateral areas of bronchiectasis.  Most severe in the right lower lobe.  Interstitial reticulation and honeycombing is identified.  Most severe in the right lower lobe.  No airspace consolidation identified.  No suspicious pulmonary nodule or mass.  Review of the visualized osseous structures is significant for mild multilevel degenerative disc disease.  Limited imaging through the upper abdomen is negative.  IMPRESSION:  1.  Exam is positive for pulmonary fibrosis or usual interstitial pneumonitis (UIP). 2.  Enlarged mediastinal lymph nodes are nonspecific in the setting of interstitial lung disease.  Original Report Authenticated By: [name], [name]   REC You do have lung damage from Rheumatoid - it is called pulmonary fibrosis  There is no cure for this disease and can get worse over time  Continue prednisone at current dose; [name] to adjust  [facility] to start [place]: I will talk to [name] about this  For your lungs, you need oxygen with exertion and sleep but as of today no need to take it at rest  REturn in 6 months  Walk test at followup   OV 07/26/2012 Followup Pulmonary fibrosis on basis of RA. Presents with wife. Overall well. , Not doing rehab: no benefit. Has not had flu shot but will have it. Wants walk test. [name] concerned about overall health and his obesity. His eating habits are very poor. Diet is largely moderate and high glycemic foods involving bread, corn, fried foods and potatoes. He and wife do want to lose weight. They want to start at [place] but have no dietary plan. In terms of RA, tolerating arava and prednisone directed by [name]  Past, Family, Social reviewed: no change since last visit  #Pulmonary Fibrosis - Clinically stable - Have flu shot today  - Next pneumonia shot is in 2014 (because you had last one in 2009) - Walk test  today - continue oxygen with sleep and exertion  - Join [employer] - Continue Rheumatoid medications per [name]  #WEight Mgmt  -  we discussed extensively about weight management   - follow low glycemic diet plan that I outlined for you after extensive discussion  - For breakfast   - recommend steel cut oat meal or fiber one 60 cal (half to one cup) with low sugar 60 cal silk soymilk and some berries and one egg  - snack with berries or low glycemic fruit or mens health 35gm mixed-nut pack  - lunch and dinner - unlimited non-starchy vegetable (prefer raw fresh or roasted or grilled) with skinless chicken or fish   - drink lot  of water  - avoid all moderate and high glycemic foods  - measure weight once  A week    #Folowup  - 9 months or sooner  - PFT at followup   OV 04/25/2013  Overall well. Present swith wife.   Main issue, Last 6 months, insidious worsneing of dry cough. Is still mild to moderate. Random. No aggravating or releiving factors. Day and nnight. No associated GERD or sinus or wheeze. Denies ACE inhibitor intake. Asssociated dyspnea is mild and class 2 and is stable/unchanged. [name] s/p rehab and is  Currently on prednisone for RA. Says joint doisease is asymptomatic and stable   In termerms of PFT; FVC worse but DLCO unchanged   PFTs 01/28/2012:  FVC 2.3L/59%,, Ratop 84, TLC 3.6L/64%, and DLCO 14/60% - c/w Restriction  PFT  04/25/13: FVC 2.1L/62%, Ratio 85/110%, TLC 3.47L/55%, DLCO 17.2/63%  PFTs 01/23/14: FVC 2.24L/68%, Ratio 88,  TLC 4.28/68%, DLCO 17.9/66% In terms of weight loss  - 6# weigh tloss in 9 months; inttentional  #Cough  - could be from nasal drainage, lung scarring and lisinopril  - because this is mild, no change in current therapy  - continue generic fluticasone inhaler 2 squirts each nostril daily x 2 months - continue OTC prilsec daily x 2 months  #Pulmonary Fibrosis  - UIP due to RA - Clinically stable - continue prednisone and immuran per Dr  Amil Amen - - continue oxygen with sleep and exertion  -high dose flu shot in fall    #FOlllwup  6 months Spirometry in office at followup Walk test at followup  OV 01/18/2014  Chief Complaint  Patient presents with  . Follow-up    pt is on abx for URI from PCP. he is currently on 5 mg twice daily for this as well.    FU Mulitfactorial chronic cough, UIP due to RA, ex smoker. HEre with wife  Cough: improved  UIP due to RA: According to him his dyspnea stable but his wife says that a few weeks ago he started getting more dyspneic. More dyspnea than baseline. Insidious worsening. It was progressive. Is no associated cough or phlegm. 4 days ago he saw his primary care physician Dr. Ashby Dawes and was given some antibiotics which then has resolved his dyspnea back to baseline. Currently he is dyspneic just walking from the front door to the rash area with the trash can. Dyspnea is relieved by rest. Associated cough is minimal. There is no associated chest pain, orthopnea, paroxysmal nocturnal dyspnea, edema, hemoptysis . He continues his Imuran and prednisone compliantly. Last CT scan of the chest was in 2013 March  Obesity: lost weight intentionally by another 5 pounds since last visit  Smoking history:   reports that he quit smoking about 29 years ago. His smoking use included Cigarettes. He has a 5 pack-year smoking history. He has never used smokeless tobacco. Last CT scan of the chest was in March 2013. No further CT scans of the chest especially  with cancer screening  #Pulmonary Fibrosis  - UIP due to RA - Clinically could be worse - continue prednisone and immuran per [name] - - continue oxygen with sleep and exertion - High Resolution CT chest without contrast on ILD protocol. Only  Dr Lorin Picket or Dr. Vinnie Langton to read - do Full PFT - do walk test  #Poor dentition  -  Please see a dentist ASAP   #FOlllwup  Return to see me after above  OV  02/01/2014  Chief Complaint  Patient presents with  . Follow-up    discuss results of CT and PFT.    here to discuss test results  #UIP due to rheumatoid arthritis:   Ct chest January 23, 2014  IMPRESSION: 1. The appearance of the chest is again compatible with underlying interstitial lung disease, and although the distribution is slightly atypical, the overall appearance is most compatible with usual interstitial pneumonia (UIP). Disease has progressed compared to the prior examination.  PFT 01/23/14 - [facility] - see below. PFT is stable x 2year   PFTs 01/28/2012:  FVC 2.3L/59%,, Ratop 84, TLC 3.6L/64%, and DLCO 14/60% - c/w Restriction  PFT  04/25/13: FVC 2.1L/62%, Ratio 85/110%, TLC 3.47L/55%, DLCO 17.2/63%  PFTs 01/23/14: FVC 2.24L/68%, Ratio 88,  TLC 4.28/68%, DLCO 17.9/66%   #lung cancer screen  - no nodule on Ct March 2015  #nfew finding - CT march 2015  . Atherosclerosis, including left anterior descending coronary artery disease. Please note that although the presence of coronary artery calcium documents the presence of coronary artery disease, the severity of this disease and any potential stenosis cannot be assessed on this non-gated CT examination. Assessment for potential risk factor modification, dietary therapy or pharmacologic therapy may be warranted, if clinically indicated.  - denies chest pain or cardiac workup in past    Review of Systems  Constitutional: Negative for fever and unexpected weight change.  HENT: Negative for congestion, dental problem, ear pain, nosebleeds, postnasal drip, rhinorrhea, sinus pressure, sneezing, sore throat and trouble swallowing.   Eyes: Negative for redness and itching.  Respiratory: Positive for shortness of breath. Negative for cough, chest tightness and wheezing.   Cardiovascular: Negative for palpitations and leg swelling.  Gastrointestinal: Negative for nausea and vomiting.  Genitourinary: Negative for dysuria.   Musculoskeletal: Negative for joint swelling.  Skin: Negative for rash.  Neurological: Negative for headaches.  Hematological: Does not bruise/bleed easily.  Psychiatric/Behavioral: Negative for dysphoric mood. The patient is not nervous/anxious.        Objective:   Physical Exam   Filed Vitals:   02/01/14 1138  BP: 132/72  Pulse: 84  Height: _0  (1.676 m)  Weight: 216 lb 12.8 oz (98.34 kg)  SpO2: 97%   Discussion only visit     Assessment & Plan:

## 2014-02-11 DIAGNOSIS — I2584 Coronary atherosclerosis due to calcified coronary lesion: Secondary | ICD-10-CM | POA: Insufficient documentation

## 2014-02-11 DIAGNOSIS — I2510 Atherosclerotic heart disease of native coronary artery without angina pectoris: Secondary | ICD-10-CM | POA: Insufficient documentation

## 2014-02-11 NOTE — Assessment & Plan Note (Signed)
#  Coronary artery calcification on CT chest  - see Dr Einar Gip cardiologist

## 2014-02-11 NOTE — Assessment & Plan Note (Addendum)
#  Pulmonary Fibrosis  - UIP due to RA - stable on breathing test PFT for 2 year but scan says you are worse - I would overall consider your disease as stable - continue prednisone and immuran per [name] - - continue oxygen with sleep and exertion - do Full PFT in 8 months; importance of serial monitoring emphasized   #Poor dentition  -  Please see a dentist    #FOlllwup  Return to see me in 8 months Full PFT at time of followp in 8 months  (> 50% of this 15 min visit spent in face to face counseling)

## 2014-10-01 ENCOUNTER — Encounter: Payer: Self-pay | Admitting: Internal Medicine

## 2014-10-01 ENCOUNTER — Ambulatory Visit (INDEPENDENT_AMBULATORY_CARE_PROVIDER_SITE_OTHER): Payer: Medicare Other | Admitting: Internal Medicine

## 2014-10-01 VITALS — BP 146/64 | HR 90 | Ht 66.0 in | Wt 227.0 lb

## 2014-10-01 DIAGNOSIS — R053 Chronic cough: Secondary | ICD-10-CM

## 2014-10-01 DIAGNOSIS — J8410 Pulmonary fibrosis, unspecified: Secondary | ICD-10-CM

## 2014-10-01 DIAGNOSIS — R05 Cough: Secondary | ICD-10-CM

## 2014-10-01 LAB — PULMONARY FUNCTION TEST
DL/VA % pred: 105 %
DL/VA: 4.57 ml/min/mmHg/L
DLCO unc % pred: 62 %
DLCO unc: 16.86 ml/min/mmHg
FEF 25-75 Post: 2.88 L/sec
FEF 25-75 Pre: 2.34 L/sec
FEF2575-%Change-Post: 22 %
FEF2575-%Pred-Post: 134 %
FEF2575-%Pred-Pre: 109 %
FEV1-%Change-Post: 6 %
FEV1-%Pred-Post: 77 %
FEV1-%Pred-Pre: 72 %
FEV1-Post: 1.9 L
FEV1-Pre: 1.78 L
FEV1FVC-%Change-Post: 0 %
FEV1FVC-%Pred-Pre: 113 %
FEV6-%Change-Post: 6 %
FEV6-%Pred-Post: 70 %
FEV6-%Pred-Pre: 66 %
FEV6-Post: 2.18 L
FEV6-Pre: 2.05 L
FEV6FVC-%Pred-Post: 105 %
FEV6FVC-%Pred-Pre: 105 %
FVC-%Change-Post: 6 %
FVC-%Pred-Post: 66 %
FVC-%Pred-Pre: 62 %
FVC-Post: 2.18 L
FVC-Pre: 2.05 L
Post FEV1/FVC ratio: 87 %
Post FEV6/FVC ratio: 100 %
Pre FEV1/FVC ratio: 87 %
Pre FEV6/FVC Ratio: 100 %
RV % pred: 56 %
RV: 1.25 L
TLC % pred: 59 %
TLC: 3.67 L

## 2014-10-01 MED ORDER — FLUTICASONE PROPIONATE 50 MCG/ACT NA SUSP: 2.0000 | Freq: Every day | NASAL | Status: DC

## 2014-10-01 MED ORDER — ALBUTEROL SULFATE HFA 108 (90 BASE) MCG/ACT IN AERS: 2.0000 | INHALATION_SPRAY | Freq: Four times a day (QID) | RESPIRATORY_TRACT | Status: DC | PRN

## 2014-10-01 MED ORDER — OMEPRAZOLE 20 MG PO CPDR: 20.0000 mg | DELAYED_RELEASE_CAPSULE | Freq: Every day | ORAL | Status: DC

## 2014-10-01 NOTE — Progress Notes (Signed)
Subjective:    Patient ID: [name], male    DOB: Feb 08, 1946, 68 y.o.   MRN: [id number]  HPI    OV 12/29/2011  68 year male.  reports that he quit smoking about 27 years ago. His smoking use included Cigarettes. He has a 5 pack-year smoking history. He has never used smokeless tobacco. Body mass index is 36.25 kg/(m^2).   OV 12/29/2011 68 year old male with Rheumatoid ARthritis ([name]) for several years. Was being treated with prednsione 19m, methotrexate and folic acid all since diagnosis several years ago. Diagnosis was only in joints. Then 1-2 month ago noticed insidious onset dyspnea but he thinks this was due to 'double pneumonia' (denies dyspnea during xmas 2012 or thanksgving 2012). Says he was diagnosed with pneumonia and a month later feels opd abx did not work and he was admitted 2/12 - 12/20/11 due to worsening dyspnea. 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 He developed increased DOE to were he was SOB walking >15 feet. Normally prior to 1 month age he had no limitation of activities(confirmed by family). + for night sweats but no fever, chills or purulent sputum. He went to his PCP Dr. RJillyn Ledgerand was dx with Pna and treated with 2 rounds of Avelox and Prednisone without any improvement in sxs. He has a appointment with Dr. RChase Callerof pulmonary but [facility] 2[facility]12 due SOB [facility] and was noted to be hypoxic. Report to have to have UIP on ct scan. CxR with ?progressive pna and PCCM asked to evaluate Pm 2/12. Assessment Hypoic resp failure in 68yo AAM with recent URI with radigraphic changes non specific in pt with RA on MTX . Plan  O2 as needed, BD's , steroids , -check esr/bnp may need 2 d echo  And D/C MTX with the thought being that " the fact his RA is well controlled while resp status is worsening over >  one month strongly favors mtx lung over RA lung dz and CAP "  Methotrexated stopped but still on prednisone but at 580mper day and asked to keep original pulm appt with me. Currently feels that he has no dyspnea. However, resting pulse ox on  Room air today 12/29/11 shows pulse ox 88% and he is silently hypoxic. REview of records show ESR 48 on 12/15/11, no eosinophilia. ECHO 12/16/11 shows ef 65% with normal RV. CT chest 2/613: shows UIP pattern but also some GGO. BNP < 2.5 on 11/24/11. Creat 34m49mon 11/16/11  REC You do have lung damage from Rheumatoid - it is called pulmonary fibrosis  I am not sure if back in feb 2013 if you had methtotrexate lung pneumonitis or due to virus. It could have been either. So, for now stay off methotrexate  Continue prednisone at current dose; [name] adjust  You might need another medication for RA; I will talk to DR BeeAmil Amenout this  For your lungs, you need oxygen 24/7 2L [place] for now and attend pulmonary rehab - nurse will do referral  Return in 6 weeks with CT chest and full PFT breathing test  Any problems come sooner   Telephone call 01/04/12 : D/w Dr BeeAmil Amenxpressed my impression that this could be primary RA relatd pulmonary disease   - enbrel  would be great but not an option due to cost and medicare  - He agrees for now hold off on methotrexate depending on ct and pft in 6 weeks  - he will consider arava depending on ct results   OV 01/28/2012  No complaints.Started on [place]. Wants to be able to  go fishing. STarted pulm rehab at [facility]   PFTs 01/28/2012  - FVC 2.3L/59%,, Ratop 84, TLC 3.6L/64%, and DLCO 14/60% - c/w Restriction   Ct Chest Wo Contrast  01/28/2012  *RADIOLOGY REPORT*  Clinical Data: Evaluate for pulmonary fibrosis  CT CHEST WITHOUT CONTRAST  Technique:  Multidetector CT imaging of the chest was performed following the standard protocol without IV contrast.  Comparison: 12/09/2011  Findings: Enlarged mediastinal lymph nodes are  identified.  The largest is in the precarinal lymph node which measures 1.3 cm.  No pericardial or pleural effusion.  There are bilateral areas of bronchiectasis.  Most severe in the right lower lobe.  Interstitial reticulation and honeycombing is identified.  Most severe in the right lower lobe.  No airspace consolidation identified.  No suspicious pulmonary nodule or mass.  Review of the visualized osseous structures is significant for mild multilevel degenerative disc disease.  Limited imaging through the upper abdomen is negative.  IMPRESSION:  1.  Exam is positive for pulmonary fibrosis or usual interstitial pneumonitis (UIP). 2.  Enlarged mediastinal lymph nodes are nonspecific in the setting of interstitial lung disease.  Original Report Authenticated By: [name], [name]   REC You do have lung damage from Rheumatoid - it is called pulmonary fibrosis  There is no cure for this disease and can get worse over time  Continue prednisone at current dose; [name] to adjust  [facility] to start [place]: I will talk to [name] about this  For your lungs, you need oxygen with exertion and sleep but as of today no need to take it at rest  REturn in 6 months  Walk test at followup   OV 07/26/2012 Followup Pulmonary fibrosis on basis of RA. Presents with wife. Overall well. , Not doing rehab: no benefit. Has not had flu shot but will have it. Wants walk test. [name] concerned about overall health and his obesity. His eating habits are very poor. Diet is largely moderate and high glycemic foods involving bread, corn, fried foods and potatoes. He and wife do want to lose weight. They want to start at [facility] but have no dietary plan. In terms of RA, tolerating arava and prednisone directed by [name]  Past, Family, Social reviewed: no change since last visit  #Pulmonary Fibrosis - Clinically stable - Have flu shot today  - Next pneumonia shot is in 2014 (because you had last one in 2009) - Walk test  today - continue oxygen with sleep and exertion  - Join [employer] - Continue Rheumatoid medications per [name]  #WEight Mgmt  -  we discussed extensively about weight management   - follow low glycemic diet plan that I outlined for you after extensive discussion  - For breakfast   - recommend steel cut oat meal or fiber one 60 cal (half to one cup) with low sugar 60 cal silk soymilk and some berries and one egg  - snack with berries or low glycemic fruit or mens health 35gm mixed-nut pack  - lunch and dinner - unlimited non-starchy vegetable (prefer raw fresh or roasted or grilled) with skinless chicken or fish   - drink  lot of water  - avoid all moderate and high glycemic foods  - measure weight once  A week    #Folowup  - 9 months or sooner  - PFT at followup   OV 04/25/2013  Overall well. Present swith wife.   Main issue, Last 6 months, insidious worsneing of dry cough. Is still mild to moderate. Random. No aggravating or releiving factors. Day and nnight. No associated GERD or sinus or wheeze. Denies ACE inhibitor intake. Asssociated dyspnea is mild and class 2 and is stable/unchanged. [name] s/p rehab and is  Currently on prednisone for RA. Says joint doisease is asymptomatic and stable   In termerms of PFT; FVC worse but DLCO unchanged   PFTs 01/28/2012:  FVC 2.3L/59%,, Ratop 84, TLC 3.6L/64%, and DLCO 14/60% - c/w Restriction  PFT  04/25/13: FVC 2.1L/62%, Ratio 85/110%, TLC 3.47L/55%, DLCO 17.2/63%  PFTs 01/23/14: FVC 2.24L/68%, Ratio 88,  TLC 4.28/68%, DLCO 17.9/66% In terms of weight loss  - 6# weigh tloss in 9 months; inttentional  #Cough  - could be from nasal drainage, lung scarring and lisinopril  - because this is mild, no change in current therapy  - continue generic fluticasone inhaler 2 squirts each nostril daily x 2 months - continue OTC prilsec daily x 2 months  #Pulmonary Fibrosis  - UIP due to RA - Clinically stable - continue prednisone and immuran per Dr  Amil Amen - - continue oxygen with sleep and exertion  -high dose flu shot in fall    #FOlllwup  6 months Spirometry in office at followup Walk test at followup  OV 01/18/2014  Chief Complaint  Patient presents with  . Follow-up    pt is on abx for URI from PCP. he is currently on 5 mg twice daily for this as well.    FU Mulitfactorial chronic cough, UIP due to RA, ex smoker. HEre with wife  Cough: improved  UIP due to RA: According to him his dyspnea stable but his wife says that a few weeks ago he started getting more dyspneic. More dyspnea than baseline. Insidious worsening. It was progressive. Is no associated cough or phlegm. 4 days ago he saw his primary care physician Dr. Ashby Dawes and was given some antibiotics which then has resolved his dyspnea back to baseline. Currently he is dyspneic just walking from the front door to the rash area with the trash can. Dyspnea is relieved by rest. Associated cough is minimal. There is no associated chest pain, orthopnea, paroxysmal nocturnal dyspnea, edema, hemoptysis . He continues his Imuran and prednisone compliantly. Last CT scan of the chest was in 2013 March  Obesity: lost weight intentionally by another 5 pounds since last visit  Smoking history:   reports that he quit smoking about 29 years ago. His smoking use included Cigarettes. He has a 5 pack-year smoking history. He has never used smokeless tobacco. Last CT scan of the chest was in March 2013. No further CT scans of the chest especially  with cancer screening  #Pulmonary Fibrosis  - UIP due to RA - Clinically could be worse - continue prednisone and immuran per [name] - - continue oxygen with sleep and exertion - High Resolution CT chest without contrast on ILD protocol. Only  Dr Lorin Picket or Dr. Vinnie Langton to read - do Full PFT - do walk test  #Poor dentition  -  Please see a dentist ASAP   #FOlllwup  Return to see me after above  OV  02/01/2014  Chief Complaint  Patient presents with  . Follow-up    discuss results of CT and PFT.    here to discuss test results  #UIP due to rheumatoid arthritis:   Ct chest January 23, 2014  IMPRESSION: 1. The appearance of the chest is again compatible with underlying interstitial lung disease, and although the distribution is slightly atypical, the overall appearance is most compatible with usual interstitial pneumonia (UIP). Disease has progressed compared to the prior examination.  PFT 01/23/14 - STABLe - see below. PFT is stable x 2year   PFTs 01/28/2012:  FVC 2.3L/59%,, Ratop 84, TLC 3.6L/64%, and DLCO 14/60% - c/w Restriction  PFT  04/25/13: FVC 2.1L/62%, Ratio 85/110%, TLC 3.47L/55%, DLCO 17.2/63%  PFTs 01/23/14: FVC 2.24L/68%, Ratio 88,  TLC 4.28/68%, DLCO 17.9/66%   #lung cancer screen  - no nodule on Ct March 2015  #nfew finding - CT march 2015  . Atherosclerosis, including left anterior descending coronary artery disease. Please note that although the presence of coronary artery calcium documents the presence of coronary artery disease, the severity of this disease and any potential stenosis cannot be assessed on this non-gated CT examination. Assessment for potential risk factor modification, dietary therapy or pharmacologic therapy may be warranted, if clinically indicated.  - denies chest pain or cardiac workup in past   OV 10/01/2014  Chief Complaint  Patient presents with  . Follow-up    Pt here after PFT. Pt states his breathing is unchanged since last OV. Pt states he has an increase in dry cough at night. Pt denies CP/tightness.    Follow-up pulmonary fibrosis UIP pattern secondary to rheumatoid arthritis. Last visit pulmonary function test was stable but CT scans suggested worsening. That was in March 2015. He reports for follow-up. He had pulmonary function test today that shows continued stability. In terms of symptoms he reports continued stability  with shortness of breath which is mild and exertional and relieved by rest. His bigger problem is chronic cough that is mild to moderate and more at night but still well tolerable however it impacts his quality-of-life a little bit. He would ideally like like to get rid of it. Cough is more at night it is dry and without any sputum production. There no other issues  PFT - STABLe - see below. PFT is stable x 3 year almost   PFTs 01/28/2012:  FVC 2.3L/59%,, Ratop 84, TLC 3.6L/64%, and DLCO 14/60% - c/w Restriction  PFT  04/25/13: FVC 2.1L/62%, Ratio 85/110%, TLC 3.47L/55%, DLCO 17.2/63%  PFTs 01/23/14: FVC 2.24L/68%, Ratio 88,  TLC 4.28/68%, DLCO 17.9/66%  PFTs 10/01/2014: FVC 2.18 L/66%, ratio 87, TLC 3.7 L/59%, DLCO 16.9/62%  Past medical history, social history and family history reviewed: No change since prior visit  Review of Systems  Constitutional: Negative for fever and unexpected weight change.  HENT: Negative for congestion, dental problem, ear pain, nosebleeds, postnasal drip, rhinorrhea, sinus pressure, sneezing, sore throat and trouble swallowing.   Eyes: Negative for redness and itching.  Respiratory: Positive for cough and shortness of breath. Negative for chest tightness and wheezing.   Cardiovascular: Negative for palpitations and leg swelling.  Gastrointestinal: Negative for nausea and vomiting.  Genitourinary: Negative for dysuria.  Musculoskeletal: Negative for joint swelling.  Skin: Negative for rash.  Neurological: Negative for headaches.  Hematological: Does not bruise/bleed easily.  Psychiatric/Behavioral: Negative for dysphoric mood. The patient is not nervous/anxious.    Current outpatient prescriptions: amLODipine (NORVASC) 10 MG tablet, Take 10 mg by  mouth daily., Disp: , Rfl: ;  aspirin 81 MG tablet, Take 81 mg by mouth daily., Disp: , Rfl: ;  azaTHIOprine (IMURAN) 50 MG tablet, Take 2 in am and 1 in pm, Disp: , Rfl: ;  folic acid (FOLVITE) 619 MCG tablet, Take 400  mcg by mouth daily., Disp: , Rfl: ;  levothyroxine (SYNTHROID, LEVOTHROID) 25 MCG tablet, Take 25 mcg by mouth daily., Disp: , Rfl:  lisinopril (PRINIVIL,ZESTRIL) 20 MG tablet, Take 1 tablet by mouth daily., Disp: , Rfl: ;  metFORMIN (GLUCOPHAGE-XR) 500 MG 24 hr tablet, Take 1 tablet by mouth 2 (two) times daily., Disp: , Rfl: ;  NOVOLIN N RELION 100 UNIT/ML injection, 22 Units. 22 units in morning, 8 units at lunch and 18 units in evening., Disp: , Rfl: ;  NOVOLIN R RELION 100 UNIT/ML injection, Inject 8 Units into the skin every morning. , Disp: , Rfl:  predniSONE (DELTASONE) 1 MG tablet, Take 5 mg by mouth 2 (two) times daily. , Disp: , Rfl: ;  PROAIR HFA 108 (90 BASE) MCG/ACT inhaler, Inhale 2 puffs into the lungs every 6 (six) hours as needed., Disp: , Rfl:      Objective:   Physical Exam  Constitutional: [name] oriented to person, place, and time. He appears well-developed and well-nourished. No distress.  obese  HENT:  Head: Normocephalic and atraumatic.  Right Ear: External ear normal.  Left Ear: External ear normal.  Mouth/Throat: Oropharynx is clear and moist. No oropharyngeal exudate.  Eyes: Conjunctivae and EOM are normal. Pupils are equal, round, and reactive to light. Right eye exhibits no discharge. Left eye exhibits no discharge. No scleral icterus.  Neck: Normal range of motion. Neck supple. No JVD present. No tracheal deviation present. No thyromegaly present.  Cardiovascular: Normal rate, regular rhythm and intact distal pulses.  Exam reveals no gallop and no friction rub.   No murmur heard. Pulmonary/Chest: Effort normal. No respiratory distress. He has no wheezes. He has rales. He exhibits no tenderness.  Crackles +  Abdominal: Soft. Bowel sounds are normal. He exhibits no distension and no mass. There is no tenderness. There is no rebound and no guarding.  Musculoskeletal: Normal range of motion. He exhibits no edema or tenderness.  Lymphadenopathy:    He has no cervical  adenopathy.  Neurological: [name] alert and oriented to person, place, and time. He has normal reflexes. No cranial nerve deficit. Coordination normal.  Skin: Skin is warm and dry. No rash noted. [name] not diaphoretic. No erythema. No pallor.  Psychiatric: He has a normal mood and affect. His behavior is normal. Judgment and thought content normal.  Nursing note and vitals reviewed.    Filed Vitals:   10/01/14 1218  BP: 146/64  Pulse: 90  Height: _0  (1.676 m)  Weight: 227 lb (102.967 kg)  SpO2: 95%        Assessment & Plan:     ICD-9-CM ICD-10-CM   1. 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) 515 J84.10   2. Chronic cough 786.2 R05     #Pulmonary Fibrosis  - UIP due to RA - stable on breathing test PFT March 2013 throuhg NOv 2015  I would overall consider your disease as stable - continue prednisone and immuran per [name] - - continue oxygen with sleep and exertion - do spirometry (not tammy wilson) in 6 months  - importance of serial monitoring emphasized   #Mild dry cough  - this is likely  due to Pulmonary fibrosis but sinus drainage and/or acid reflux could be playing a role  - START generic fluticasone inhaler 2 squirts each nostril daily x 1 month - sTART OTC prilosec 85m once daily x 1 month - call back in 1 month to report on cough  #FOlllwup  - Call back in 1 month to report on cough  Office spirometry (not Tammy Wilson) in 6 months Return to see my NP Tammy in 6 months     Dr. MBrand Males [name], FSon[facility] Eye Surgery CtrC.P Pulmonary and Critical Care Medicine Staff Physician CNewport EastPulmonary and Critical Care Pager: [phone] If no answer or between  15:00h - 7:00h: call [phone]  10/01/2014 12:36 PM

## 2014-10-01 NOTE — Patient Instructions (Addendum)
#  Pulmonary Fibrosis  - UIP due to RA - stable on breathing test PFT March 2013 throuhg NOv 2015  I would overall consider your disease as stable - continue prednisone and immuran per [name] - - continue oxygen with sleep and exertion - do spirometry (not tammy wilson) in 6 months  - importance of serial monitoring emphasized   #Mild dry cough  - this is likely due to Pulmonary fibrosis but sinus drainage and/or acid reflux could be playing a role  - START generic fluticasone inhaler 2 squirts each nostril daily x 1 month - sTART OTC prilosec 77m once daily x 1 month - call back in 1 month to report on cough  #FOlllwup  - Call back in 1 month to report on cough  Office spirometry (not Tammy Wilson) in 6 months Return to see my NP Tammy in 6 months

## 2014-10-01 NOTE — Progress Notes (Signed)
PFT done today.

## 2014-11-02 DIAGNOSIS — R0902 Hypoxemia: Secondary | ICD-10-CM | POA: Diagnosis not present

## 2014-11-19 DIAGNOSIS — I10 Essential (primary) hypertension: Secondary | ICD-10-CM | POA: Diagnosis not present

## 2014-11-19 DIAGNOSIS — M0589 Other rheumatoid arthritis with rheumatoid factor of multiple sites: Secondary | ICD-10-CM | POA: Diagnosis not present

## 2014-11-19 DIAGNOSIS — E1065 Type 1 diabetes mellitus with hyperglycemia: Secondary | ICD-10-CM | POA: Diagnosis not present

## 2014-11-26 DIAGNOSIS — K2970 Gastritis, unspecified, without bleeding: Secondary | ICD-10-CM | POA: Diagnosis not present

## 2014-11-26 DIAGNOSIS — K219 Gastro-esophageal reflux disease without esophagitis: Secondary | ICD-10-CM | POA: Diagnosis not present

## 2014-11-26 DIAGNOSIS — E1065 Type 1 diabetes mellitus with hyperglycemia: Secondary | ICD-10-CM | POA: Diagnosis not present

## 2014-11-26 DIAGNOSIS — E782 Mixed hyperlipidemia: Secondary | ICD-10-CM | POA: Diagnosis not present

## 2014-12-03 DIAGNOSIS — R0902 Hypoxemia: Secondary | ICD-10-CM | POA: Diagnosis not present

## 2015-01-01 DIAGNOSIS — R0902 Hypoxemia: Secondary | ICD-10-CM | POA: Diagnosis not present

## 2015-02-01 DIAGNOSIS — R0902 Hypoxemia: Secondary | ICD-10-CM | POA: Diagnosis not present

## 2015-03-03 DIAGNOSIS — R0902 Hypoxemia: Secondary | ICD-10-CM | POA: Diagnosis not present

## 2015-03-04 DIAGNOSIS — E1065 Type 1 diabetes mellitus with hyperglycemia: Secondary | ICD-10-CM | POA: Diagnosis not present

## 2015-03-04 DIAGNOSIS — E782 Mixed hyperlipidemia: Secondary | ICD-10-CM | POA: Diagnosis not present

## 2015-03-18 DIAGNOSIS — E782 Mixed hyperlipidemia: Secondary | ICD-10-CM | POA: Diagnosis not present

## 2015-03-18 DIAGNOSIS — E1065 Type 1 diabetes mellitus with hyperglycemia: Secondary | ICD-10-CM | POA: Diagnosis not present

## 2015-03-18 DIAGNOSIS — K219 Gastro-esophageal reflux disease without esophagitis: Secondary | ICD-10-CM | POA: Diagnosis not present

## 2015-04-03 DIAGNOSIS — R0902 Hypoxemia: Secondary | ICD-10-CM | POA: Diagnosis not present

## 2015-04-22 DIAGNOSIS — M150 Primary generalized (osteo)arthritis: Secondary | ICD-10-CM | POA: Diagnosis not present

## 2015-04-22 DIAGNOSIS — Z7952 Long term (current) use of systemic steroids: Secondary | ICD-10-CM | POA: Diagnosis not present

## 2015-04-22 DIAGNOSIS — J8410 Pulmonary fibrosis, unspecified: Secondary | ICD-10-CM | POA: Diagnosis not present

## 2015-04-22 DIAGNOSIS — M0589 Other rheumatoid arthritis with rheumatoid factor of multiple sites: Secondary | ICD-10-CM | POA: Diagnosis not present

## 2015-04-24 ENCOUNTER — Ambulatory Visit (INDEPENDENT_AMBULATORY_CARE_PROVIDER_SITE_OTHER): Payer: Medicare Other | Admitting: Internal Medicine

## 2015-04-24 ENCOUNTER — Encounter: Payer: Self-pay | Admitting: Internal Medicine

## 2015-04-24 VITALS — BP 158/80 | HR 91 | Ht 66.0 in | Wt 218.0 lb

## 2015-04-24 DIAGNOSIS — J8410 Pulmonary fibrosis, unspecified: Secondary | ICD-10-CM

## 2015-04-24 DIAGNOSIS — R05 Cough: Secondary | ICD-10-CM

## 2015-04-24 DIAGNOSIS — R053 Chronic cough: Secondary | ICD-10-CM

## 2015-04-24 NOTE — Patient Instructions (Addendum)
#  Pulmonary Fibrosis  - UIP due to RA - clinically stable But - stable on breathing test PFT March 2013 throuhg NOv 2015 but worse June 2016 on office spirometry - I would overall consider your disease as stable v slightly worse; need better data to determine progression  PLAN - continue prednisone and immuran per [name] - - continue oxygen with sleep and exertion - do full PFT in 3 months and - do HRCT chest in 3 months ; to better quantify progression - if disease progressing might need to consider cellcept instead of immuran  #Mild dry cough  - this is likely due to Pulmonary fibrosis but  acid reflux could be playing a definite  role  - refer to Dr Amedeo Plenty of [facility]   #FOlllwup  -Full PFT in 3 months - HRCT chest in 3 months  - Return to see me in  3 months after PFT and CT

## 2015-04-24 NOTE — Progress Notes (Signed)
Subjective:    Patient ID: [name], male    DOB: [birth year], 70 y.o.   MRN: [id number]  HPI  HPI    OV 12/29/2011  69 year male.  reports that he quit smoking about 27 years ago. His smoking use included Cigarettes. He has a 5 pack-year smoking history. He has never used smokeless tobacco. Body mass index is 36.25 kg/(m^2).   OV 12/29/2011 69 year old male with Rheumatoid ARthritis ([name]) for several years. Was being treated with prednsione 74m, methotrexate and folic acid all since diagnosis several years ago. Diagnosis was only in joints. Then 1-2 month ago noticed insidious onset dyspnea but he thinks this was due to 'double pneumonia' (denies dyspnea during xmas 2012 or thanksgving 2012). Says he was diagnosed with pneumonia and a month later feels opd abx did not work and he was admitted 2/12 - 12/20/11 due to worsening dyspnea. 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 He developed increased DOE to were he was SOB walking >15 feet. Normally prior to 1 month age he had no limitation of activities(confirmed by family). + for night sweats but no fever, chills or purulent sputum. He went to his PCP Dr. RJillyn Ledgerand was dx with Pna and treated with 2 rounds of Avelox and Prednisone without any improvement in sxs. He has a appointment with Dr. RChase Callerof pulmonary but on 2/12 due SOB [facility] and was noted to be hypoxic. Report to have to have UIP on ct scan. CxR with ?progressive pna and PCCM asked to evaluate Pm 2/12. Assessment Hypoic resp failure in 69yo AAM with recent URI with radigraphic changes non specific in pt with RA on MTX . Plan  O2 as needed, BD's , steroids , -check esr/bnp may need 2 d echo  And D/C MTX with the thought being that " the fact his RA is well controlled while resp status is worsening  over > one month strongly favors mtx lung over RA lung dz and CAP "  Methotrexated stopped but still on prednisone but at 540mper day and asked to keep original pulm appt with me. Currently feels that he has no dyspnea. However, resting pulse ox on  Room air today 12/29/11 shows pulse ox 88% and he is silently hypoxic. REview of records show ESR 48 on 12/15/11, no eosinophilia. ECHO 12/16/11 shows ef 65% with normal RV. CT chest 2/613: shows UIP pattern but also some GGO. BNP < 2.5 on 11/24/11. Creat 78m62mon 11/16/11  REC You do have lung damage from Rheumatoid - it is called pulmonary fibrosis  I am not sure if back in feb 2013 if you had methtotrexate lung pneumonitis or due to virus. It could have been either. So, for now stay off methotrexate  Continue prednisone at current dose; [name] adjust  You might need another medication for RA; I will talk to DR BeeAmil Amenout this  For your lungs, you need oxygen 24/7 2L [place] for now and attend pulmonary rehab - nurse will do referral  Return in 6 weeks with CT chest and full PFT breathing test  Any problems come sooner   Telephone call 01/04/12 : D/w Dr BeeAmil Amenxpressed my impression that this could be primary RA relatd pulmonary disease   -  enbrel would be great but not an option due to cost and medicare  - He agrees for now hold off on methotrexate depending on ct and pft in 6 weeks  - he will consider arava depending on ct results   OV 01/28/2012  No complaints.Started on [place]. Wants to be able to  go fishing. STarted pulm rehab at [facility]   PFTs 01/28/2012  - FVC 2.3L/59%,, Ratop 84, TLC 3.6L/64%, and DLCO 14/60% - c/w Restriction   Ct Chest Wo Contrast  01/28/2012  *RADIOLOGY REPORT*  Clinical Data: Evaluate for pulmonary fibrosis  CT CHEST WITHOUT CONTRAST  Technique:  Multidetector CT imaging of the chest was performed following the standard protocol without IV contrast.  Comparison: 12/09/2011  Findings: Enlarged mediastinal lymph  nodes are identified.  The largest is in the precarinal lymph node which measures 1.3 cm.  No pericardial or pleural effusion.  There are bilateral areas of bronchiectasis.  Most severe in the right lower lobe.  Interstitial reticulation and honeycombing is identified.  Most severe in the right lower lobe.  No airspace consolidation identified.  No suspicious pulmonary nodule or mass.  Review of the visualized osseous structures is significant for mild multilevel degenerative disc disease.  Limited imaging through the upper abdomen is negative.  IMPRESSION:  1.  Exam is positive for pulmonary fibrosis or usual interstitial pneumonitis (UIP). 2.  Enlarged mediastinal lymph nodes are nonspecific in the setting of interstitial lung disease.  Original Report Authenticated By: [name], [name]   REC You do have lung damage from Rheumatoid - it is called pulmonary fibrosis  There is no cure for this disease and can get worse over time  Continue prednisone at current dose; [name] to adjust  [facility] to start [place]: I will talk to [name] about this  For your lungs, you need oxygen with exertion and sleep but as of today no need to take it at rest  REturn in 6 months  Walk test at followup   OV 07/26/2012 Followup Pulmonary fibrosis on basis of RA. Presents with wife. Overall well. , Not doing rehab: no benefit. Has not had flu shot but will have it. Wants walk test. [name] concerned about overall health and his obesity. His eating habits are very poor. Diet is largely moderate and high glycemic foods involving bread, corn, fried foods and potatoes. He and wife do want to lose weight. They want to start at [facility] but have no dietary plan. In terms of RA, tolerating arava and prednisone directed by [name]  Past, Family, Social reviewed: no change since last visit  #Pulmonary Fibrosis - Clinically stable - Have flu shot today  - Next pneumonia shot is in 2014 (because you had last one in 2009) -  Walk test today - continue oxygen with sleep and exertion  - Join [employer] - Continue Rheumatoid medications per [name]  #WEight Mgmt  -  we discussed extensively about weight management   - follow low glycemic diet plan that I outlined for you after extensive discussion  - For breakfast   - recommend steel cut oat meal or fiber one 60 cal (half to one cup) with low sugar 60 cal silk soymilk and some berries and one egg  - snack with berries or low glycemic fruit or mens health 35gm mixed-nut pack  - lunch and dinner - unlimited non-starchy vegetable (prefer raw fresh or roasted or grilled) with skinless chicken or fish   -  drink lot of water  - avoid all moderate and high glycemic foods  - measure weight once  A week    #Folowup  - 9 months or sooner  - PFT at followup   OV 04/25/2013  Overall well. Present swith wife.   Main issue, Last 6 months, insidious worsneing of dry cough. Is still mild to moderate. Random. No aggravating or releiving factors. Day and nnight. No associated GERD or sinus or wheeze. Denies ACE inhibitor intake. Asssociated dyspnea is mild and class 2 and is stable/unchanged. [name] s/p rehab and is  Currently on prednisone for RA. Says joint doisease is asymptomatic and stable   In termerms of PFT; FVC worse but DLCO unchanged   PFTs 01/28/2012:  FVC 2.3L/59%,, Ratop 84, TLC 3.6L/64%, and DLCO 14/60% - c/w Restriction  PFT  04/25/13: FVC 2.1L/62%, Ratio 85/110%, TLC 3.47L/55%, DLCO 17.2/63%  PFTs 01/23/14: FVC 2.24L/68%, Ratio 88,  TLC 4.28/68%, DLCO 17.9/66% In terms of weight loss  - 6# weigh tloss in 9 months; inttentional  #Cough  - could be from nasal drainage, lung scarring and lisinopril  - because this is mild, no change in current therapy  - continue generic fluticasone inhaler 2 squirts each nostril daily x 2 months - continue OTC prilsec daily x 2 months  #Pulmonary Fibrosis  - UIP due to RA - Clinically stable - continue prednisone and  immuran per [name] - - continue oxygen with sleep and exertion  -high dose flu shot in fall    #FOlllwup  6 months Spirometry in office at followup Walk test at followup  OV 01/18/2014  Chief Complaint  Patient presents with  . Follow-up    pt is on abx for URI from PCP. he is currently on 5 mg twice daily for this as well.    FU Mulitfactorial chronic cough, UIP due to RA, ex smoker. HEre with wife  Cough: improved  UIP due to RA: According to him his dyspnea stable but his wife says that a few weeks ago he started getting more dyspneic. More dyspnea than baseline. Insidious worsening. It was progressive. Is no associated cough or phlegm. 4 days ago he saw his primary care physician Dr. Ashby Dawes and was given some antibiotics which then has resolved his dyspnea back to baseline. Currently he is dyspneic just walking from the front door to the rash area with the trash can. Dyspnea is relieved by rest. Associated cough is minimal. There is no associated chest pain, orthopnea, paroxysmal nocturnal dyspnea, edema, hemoptysis . He continues his Imuran and prednisone compliantly. Last CT scan of the chest was in 2013 March  Obesity: lost weight intentionally by another 5 pounds since last visit  Smoking history:   reports that he quit smoking about 29 years ago. His smoking use included Cigarettes. He has a 5 pack-year smoking history. He has never used smokeless tobacco. Last CT scan of the chest was in March 2013. No further CT scans of the chest especially  with cancer screening  #Pulmonary Fibrosis  - UIP due to RA - Clinically could be worse - continue prednisone and immuran per [name] - - continue oxygen with sleep and exertion - High Resolution CT chest without contrast on ILD protocol. Only  Dr Lorin Picket or Dr. Vinnie Langton to read - do Full PFT - do walk test  #Poor dentition  -  Please see a dentist ASAP   #FOlllwup  Return to see me after above  OV  02/01/2014  Chief Complaint  Patient presents with  . Follow-up    discuss results of CT and PFT.    here to discuss test results  #UIP due to rheumatoid arthritis:   Ct chest January 23, 2014  IMPRESSION: 1. The appearance of the chest is again compatible with underlying interstitial lung disease, and although the distribution is slightly atypical, the overall appearance is most compatible with usual interstitial pneumonia (UIP). Disease has progressed compared to the prior examination.  PFT 01/23/14 - STABLe - see below. PFT is stable x 2year   PFTs 01/28/2012:  FVC 2.3L/59%,, Ratop 84, TLC 3.6L/64%, and DLCO 14/60% - c/w Restriction  PFT  04/25/13: FVC 2.1L/62%, Ratio 85/110%, TLC 3.47L/55%, DLCO 17.2/63%  PFTs 01/23/14: FVC 2.24L/68%, Ratio 88,  TLC 4.28/68%, DLCO 17.9/66%   #lung cancer screen  - no nodule on Ct March 2015  #nfew finding - CT march 2015  . Atherosclerosis, including left anterior descending coronary artery disease. Please note that although the presence of coronary artery calcium documents the presence of coronary artery disease, the severity of this disease and any potential stenosis cannot be assessed on this non-gated CT examination. Assessment for potential risk factor modification, dietary therapy or pharmacologic therapy may be warranted, if clinically indicated.  - denies chest pain or cardiac workup in past   OV 10/01/2014  Chief Complaint  Patient presents with  . Follow-up    Pt here after PFT. Pt states his breathing is unchanged since last OV. Pt states he has an increase in dry cough at night. Pt denies CP/tightness.    Follow-up pulmonary fibrosis UIP pattern secondary to rheumatoid arthritis. Last visit pulmonary function test was stable but CT scans suggested worsening. That was in March 2015. He reports for follow-up. He had pulmonary function test today that shows continued stability. In terms of symptoms he reports continued stability  with shortness of breath which is mild and exertional and relieved by rest. His bigger problem is chronic cough that is mild to moderate and more at night but still well tolerable however it impacts his quality-of-life a little bit. He would ideally like like to get rid of it. Cough is more at night it is dry and without any sputum production. There no other issues  PFT - STABLe - see below. PFT is stable x 3 year almost   PFTs 01/28/2012:  FVC 2.3L/59%,, Ratop 84, TLC 3.6L/64%, and DLCO 14/60% - c/w Restriction  PFT  04/25/13: FVC 2.1L/62%, Ratio 85/110%, TLC 3.47L/55%, DLCO 17.2/63%  PFTs 01/23/14: FVC 2.24L/68%, Ratio 88,  TLC 4.28/68%, DLCO 17.9/66%  PFTs 10/01/2014: FVC 2.18 L/66%, ratio 87, TLC 3.7 L/59%, DLCO 16.9/62%  Past medical history, social history and family history reviewed: No change since prior visit   OV  04/24/2015  Chief Complaint  Patient presents with  . Follow-up    Pt stated his breathing is unchanged since last OV. Pt c/o cough after eating at times and also becomes SOB while eating. Pt denies CP/tightness.     Follow-up pulmonary fibrosis UIP pattern secondary to rheumatoid arthritis. He continues on immuran and prednisone through Dr. Amil Amen .[name] Kitchen Last visit was in November 2015. He presents as usual for follow-up with his wife. He continues to be a poor historian. His main issues are  - Dyspnea: This is stable and mild according to him. But his wife reports that to have progressed. [name] not able to do some of the garden activities he used  to do in the past a year ago. Correspondingly Spirometry in the office today 04/24/2015: FVC 1.9 L/58%, FEV1 1.6 L/61% with a ratio of 83/106%. FEC shows a decline compared to baseline. However Walking desaturation test in the office 185 feet 3 laps today on room air: He did not desaturate below 88%. In the past he has decline in the office with exertion. Therefore there is conflicting data about his progression . LAst CT chest spring  2015  - Chronic cough: He reported chronic cough for the first time for noticeable amount 6 months ago in November 2015. At that time I recommended that he try NaSal steroid-induced and acid reflux treatment and call me back in 1 month to report progress. It is unclear at this point whether he tried nasal stent or antiacid reflux treatment. Both his wife and he cannot remember. It appears that he might of at least tried nasal sterile without any benefit. Cough is reported as mild to moderate. It happens immediately after eating but not at other times. [In fibrosis we would expect constant cough]. His wife thinks that he might be underplaying his symptom severely. He sees gastroenterologist Dr. Amedeo Plenty but has not seen him in a long time      Current outpatient prescriptions:  .  albuterol (PROAIR HFA) 108 (90 BASE) MCG/ACT inhaler, Inhale 2 puffs into the lungs every 6 (six) hours as needed., Disp: 1 Inhaler, Rfl: 3 .  amLODipine (NORVASC) 10 MG tablet, Take 10 mg by mouth daily., Disp: , Rfl:  .  aspirin 81 MG tablet, Take 81 mg by mouth daily., Disp: , Rfl:  .  azaTHIOprine (IMURAN) 50 MG tablet, Take 2 in am and 1 in pm, Disp: , Rfl:  .  folic acid (FOLVITE) 259 MCG tablet, Take 400 mcg by mouth daily., Disp: , Rfl:  .  levothyroxine (SYNTHROID, LEVOTHROID) 25 MCG tablet, Take 25 mcg by mouth daily., Disp: , Rfl:  .  metFORMIN (GLUCOPHAGE-XR) 500 MG 24 hr tablet, Take 1 tablet by mouth 2 (two) times daily., Disp: , Rfl:  .  NOVOLIN N RELION 100 UNIT/ML injection, 22 Units. 20 units in morning,22 units in evening., Disp: , Rfl:  .  NOVOLIN R RELION 100 UNIT/ML injection, Inject 8 Units into the skin every morning. , Disp: , Rfl:  .  predniSONE (DELTASONE) 1 MG tablet, Take 5 mg by mouth 2 (two) times daily. , Disp: , Rfl:     Review of Systems  Constitutional: Negative for fever and unexpected weight change.  HENT: Negative for congestion, dental problem, ear pain, nosebleeds, postnasal drip,  rhinorrhea, sinus pressure, sneezing, sore throat and trouble swallowing.   Eyes: Negative for redness and itching.  Respiratory: Positive for cough and shortness of breath. Negative for chest tightness and wheezing.   Cardiovascular: Negative for palpitations and leg swelling.  Gastrointestinal: Negative for nausea and vomiting.  Genitourinary: Negative for dysuria.  Musculoskeletal: Negative for joint swelling.  Skin: Negative for rash.  Neurological: Negative for headaches.  Hematological: Does not bruise/bleed easily.  Psychiatric/Behavioral: Negative for dysphoric mood. The patient is not nervous/anxious.        Objective:   Physical Exam  Constitutional: [name] oriented to person, place, and time. He appears well-developed and well-nourished. No distress.  Body mass index is 35.2 kg/(m^2).   HENT:  Head: Normocephalic and atraumatic.  Right Ear: External ear normal.  Left Ear: External ear normal.  Mouth/Throat: Oropharynx is clear and moist. No oropharyngeal exudate.  Caries of teeth +  Eyes: Conjunctivae and EOM are normal. Pupils are equal, round, and reactive to light. Right eye exhibits no discharge. Left eye exhibits no discharge. No scleral icterus.  Neck: Normal range of motion. Neck supple. No JVD present. No tracheal deviation present. No thyromegaly present.  Cardiovascular: Normal rate, regular rhythm and intact distal pulses.  Exam reveals no gallop and no friction rub.   No murmur heard. Pulmonary/Chest: Effort normal. No respiratory distress. He has no wheezes. He has rales. He exhibits no tenderness.  Abdominal: Soft. Bowel sounds are normal. He exhibits no distension and no mass. There is no tenderness. There is no rebound and no guarding.  Visceral obesity +  Musculoskeletal: Normal range of motion. He exhibits no edema or tenderness.  Lymphadenopathy:    He has no cervical adenopathy.  Neurological: [name] alert and oriented to person, place, and time. He has  normal reflexes. No cranial nerve deficit. Coordination normal.  Skin: Skin is warm and dry. No rash noted. [name] not diaphoretic. No erythema. No pallor.  Psychiatric: He has a normal mood and affect. His behavior is normal. Judgment and thought content normal.  Nursing note and vitals reviewed.   Filed Vitals:   04/24/15 0926  BP: 158/80  Pulse: 91  Height: 5' 6" (1.676 m)  Weight: 218 lb (98.884 kg)  SpO2: 96%         Assessment & Plan:     ICD-9-CM ICD-10-CM   1. 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) 515 J84.10 Spirometry with Graph     CT Chest High Resolution  2. Chronic cough 786.2 R05 Ambulatory referral to Gastroenterology     Pulmonary Function Test   #Pulmonary Fibrosis  - UIP due to RA - clinically stable But - stable on breathing test PFT March 2013 throuhg NOv 2015 but worse June 2016 on office spirometry - I would overall consider your disease as stable v slightly worse; need better data to determine progression  PLAN - continue prednisone and immuran per [name] - - continue oxygen with sleep and exertion - do full PFT in 3 months and - do HRCT chest in 3 months ; to better quantify progression - if disease progressing might need to consider cellcept instead of immuran  #Mild dry cough  - this is likely due to Pulmonary fibrosis but  acid reflux could be playing a definite  role  - refer to Dr Amedeo Plenty of [facility]   #FOlllwup  -Full PFT in 3 months - HRCT chest in 3 months  - Return to see me in  3 months after PFT and CT   Dr. Brand Males, [name], [facility].C.P Pulmonary and Critical Care Medicine Staff Physician [facility] Pager: [phone], If no answer or between  15:00h - 7:00h: call [phone]  04/24/2015 10:03 AM

## 2015-05-03 DIAGNOSIS — R0902 Hypoxemia: Secondary | ICD-10-CM | POA: Diagnosis not present

## 2015-06-03 DIAGNOSIS — R0902 Hypoxemia: Secondary | ICD-10-CM | POA: Diagnosis not present

## 2015-06-04 DIAGNOSIS — R05 Cough: Secondary | ICD-10-CM | POA: Diagnosis not present

## 2015-07-04 DIAGNOSIS — R0902 Hypoxemia: Secondary | ICD-10-CM | POA: Diagnosis not present

## 2015-07-23 DIAGNOSIS — H2513 Age-related nuclear cataract, bilateral: Secondary | ICD-10-CM | POA: Diagnosis not present

## 2015-07-23 DIAGNOSIS — E119 Type 2 diabetes mellitus without complications: Secondary | ICD-10-CM | POA: Diagnosis not present

## 2015-07-25 ENCOUNTER — Ambulatory Visit (INDEPENDENT_AMBULATORY_CARE_PROVIDER_SITE_OTHER)
Admission: RE | Admit: 2015-07-25 | Discharge: 2015-07-25 | Disposition: A | Discharge status: Home / Self Care | Payer: Medicare Other | Source: Ambulatory Visit | Attending: Internal Medicine | Admitting: Internal Medicine

## 2015-07-25 DIAGNOSIS — J8410 Pulmonary fibrosis, unspecified: Secondary | ICD-10-CM | POA: Diagnosis not present

## 2015-07-25 DIAGNOSIS — R911 Solitary pulmonary nodule: Secondary | ICD-10-CM | POA: Diagnosis not present

## 2015-07-25 DIAGNOSIS — R0602 Shortness of breath: Secondary | ICD-10-CM | POA: Diagnosis not present

## 2015-07-29 ENCOUNTER — Ambulatory Visit (INDEPENDENT_AMBULATORY_CARE_PROVIDER_SITE_OTHER): Payer: Medicare Other | Admitting: Internal Medicine

## 2015-07-29 ENCOUNTER — Encounter: Payer: Self-pay | Admitting: Internal Medicine

## 2015-07-29 VITALS — BP 140/72 | HR 103 | Ht 66.0 in | Wt 210.2 lb

## 2015-07-29 DIAGNOSIS — J8410 Pulmonary fibrosis, unspecified: Secondary | ICD-10-CM

## 2015-07-29 DIAGNOSIS — R911 Solitary pulmonary nodule: Secondary | ICD-10-CM | POA: Diagnosis not present

## 2015-07-29 DIAGNOSIS — Z23 Encounter for immunization: Secondary | ICD-10-CM | POA: Diagnosis not present

## 2015-07-29 DIAGNOSIS — R05 Cough: Secondary | ICD-10-CM | POA: Diagnosis not present

## 2015-07-29 DIAGNOSIS — R053 Chronic cough: Secondary | ICD-10-CM

## 2015-07-29 LAB — PULMONARY FUNCTION TEST
DL/VA % pred: 119 %
DL/VA: 5.17 ml/min/mmHg/L
DLCO unc % pred: 62 %
DLCO unc: 16.72 ml/min/mmHg
FEF 25-75 Post: 2.85 L/sec
FEF 25-75 Pre: 2.37 L/sec
FEF2575-%Change-Post: 20 %
FEF2575-%Pred-Post: 135 %
FEF2575-%Pred-Pre: 112 %
FEV1-%Change-Post: 4 %
FEV1-%Pred-Post: 80 %
FEV1-%Pred-Pre: 77 %
FEV1-Post: 1.95 L
FEV1-Pre: 1.88 L
FEV1FVC-%Change-Post: 2 %
FEV1FVC-%Pred-Pre: 113 %
FEV6-%Change-Post: 2 %
FEV6-%Pred-Post: 71 %
FEV6-%Pred-Pre: 69 %
FEV6-Post: 2.2 L
FEV6-Pre: 2.15 L
FEV6FVC-%Pred-Post: 105 %
FEV6FVC-%Pred-Pre: 105 %
FVC-%Change-Post: 1 %
FVC-%Pred-Post: 67 %
FVC-%Pred-Pre: 66 %
FVC-Post: 2.2 L
FVC-Pre: 2.17 L
Post FEV1/FVC ratio: 89 %
Post FEV6/FVC ratio: 100 %
Pre FEV1/FVC ratio: 86 %
Pre FEV6/FVC Ratio: 100 %
RV % pred: 63 %
RV: 1.43 L
TLC % pred: 62 %
TLC: 3.91 L

## 2015-07-29 NOTE — Progress Notes (Signed)
PFT done today.

## 2015-07-29 NOTE — Progress Notes (Signed)
Subjective:    Patient ID: [name], male    DOB: 1946/06/24, 69 y.o.   MRN: [id number]  HPI   HPI    OV 12/29/2011  69 year male.  reports that he quit smoking about 27 years ago. His smoking use included Cigarettes. He has a 5 pack-year smoking history. He has never used smokeless tobacco. Body mass index is 36.25 kg/(m^2).   OV 12/29/2011 68 year old male with Rheumatoid ARthritis ([name]) for several years. Was being treated with prednsione 34m, methotrexate and folic acid all since diagnosis several years ago. Diagnosis was only in joints. Then 1-2 month ago noticed insidious onset dyspnea but he thinks this was due to 'double pneumonia' (denies dyspnea during xmas 2012 or thanksgving 2012). Says he was diagnosed with pneumonia and a month later feels opd abx did not work and he was admitted 2/12 - 12/20/11 due to worsening dyspnea. 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 He developed increased DOE to were he was SOB walking >15 feet. Normally prior to 1 month age he had no limitation of activities(confirmed by family). + for night sweats but no fever, chills or purulent sputum. He went to his PCP Steven. RJillyn Ledgerand was dx with Pna and treated with 2 rounds of Avelox and Prednisone without any improvement in sxs. He has a appointment with Steven. RChase Callerof pulmonary but [facility] 2[facility] was noted to be hypoxic. Report to have to have UIP on ct scan. CxR with ?progressive pna and PCCM asked to evaluate Pm 2/12. Assessment Hypoic resp failure in 69yo AAM with recent URI with radigraphic changes non specific in pt with RA on MTX . Plan  O2 as needed, BD's , steroids , -check esr/bnp may need 2 d echo  And D/C MTX with the thought being that " the fact his RA is well controlled while resp status is worsening  over > one month strongly favors mtx lung over RA lung dz and CAP "  Methotrexated stopped but still on prednisone but at 537mper day and asked to keep original pulm appt with me. Currently feels that he has no dyspnea. However, resting pulse ox on  Room air today 12/29/11 shows pulse ox 88% and he is silently hypoxic. REview of records show ESR 48 on 12/15/11, no eosinophilia. ECHO 12/16/11 shows ef 65% with normal RV. CT chest 2/613: shows UIP pattern but also some GGO. BNP < 2.5 on 11/24/11. Creat 72m42mon 11/16/11  REC You do have lung damage from Rheumatoid - it is called pulmonary fibrosis  I am not sure if back in feb 2013 if you had methtotrexate lung pneumonitis or due to virus. It could have been either. So, for now stay off methotrexate  Continue prednisone at current dose; [name] adjust  You might need another medication for RA; I will talk to Steven BeeAmil Amenout this  For your lungs, you need oxygen 24/7 2L [place] for now and attend pulmonary rehab - nurse will do referral  Return in 6 weeks with CT chest and full PFT breathing test  Any problems come sooner   Telephone call 01/04/12 : D/w Steven BeeAmil Amenxpressed my impression that this could be primary RA relatd pulmonary disease   -  enbrel would be great but not an option due to cost and medicare  - He agrees for now hold off on methotrexate depending on ct and pft in 6 weeks  - he will consider arava depending on ct results   OV 01/28/2012  No complaints.Started on [place]. Wants to be able to  go fishing. STarted pulm rehab at [facility]   PFTs 01/28/2012  - FVC 2.3L/59%,, Ratop 84, TLC 3.6L/64%, and DLCO 14/60% - c/w Restriction   Ct Chest Wo Contrast  01/28/2012  *RADIOLOGY REPORT*  Clinical Data: Evaluate for pulmonary fibrosis  CT CHEST WITHOUT CONTRAST  Technique:  Multidetector CT imaging of the chest was performed following the standard protocol without IV contrast.  Comparison: 12/09/2011  Findings: Enlarged mediastinal lymph  nodes are identified.  The largest is in the precarinal lymph node which measures 1.3 cm.  No pericardial or pleural effusion.  There are bilateral areas of bronchiectasis.  Most severe in the right lower lobe.  Interstitial reticulation and honeycombing is identified.  Most severe in the right lower lobe.  No airspace consolidation identified.  No suspicious pulmonary nodule or mass.  Review of the visualized osseous structures is significant for mild multilevel degenerative disc disease.  Limited imaging through the upper abdomen is negative.  IMPRESSION:  1.  Exam is positive for pulmonary fibrosis or usual interstitial pneumonitis (UIP). 2.  Enlarged mediastinal lymph nodes are nonspecific in the setting of interstitial lung disease.  Original Report Authenticated By: [name], [name]   REC You do have lung damage from Rheumatoid - it is called pulmonary fibrosis  There is no cure for this disease and can get worse over time  Continue prednisone at current dose; [name] to adjust  [facility] to start [place]: I will talk to [name] about this  For your lungs, you need oxygen with exertion and sleep but as of today no need to take it at rest  REturn in 6 months  Walk test at followup   OV 07/26/2012 Followup Pulmonary fibrosis on basis of RA. Presents with wife. Overall well. , Not doing rehab: no benefit. Has not had flu shot but will have it. Wants walk test. [name] concerned about overall health and his obesity. His eating habits are very poor. Diet is largely moderate and high glycemic foods involving bread, corn, fried foods and potatoes. He and wife do want to lose weight. They want to start at [facility] but have no dietary plan. In terms of RA, tolerating arava and prednisone directed by [name]  Past, Family, Social reviewed: no change since last visit  #Pulmonary Fibrosis - Clinically stable - Have flu shot today  - Next pneumonia shot is in 2014 (because you had last one in 2009) -  Walk test today - continue oxygen with sleep and exertion  - Join [employer] - Continue Rheumatoid medications per [name]  #WEight Mgmt  -  we discussed extensively about weight management   - follow low glycemic diet plan that I outlined for you after extensive discussion  - For breakfast   - recommend steel cut oat meal or fiber one 60 cal (half to one cup) with low sugar 60 cal silk soymilk and some berries and one egg  - snack with berries or low glycemic fruit or mens health 35gm mixed-nut pack  - lunch and dinner - unlimited non-starchy vegetable (prefer raw fresh or roasted or grilled) with skinless chicken or fish   -  drink lot of water  - avoid all moderate and high glycemic foods  - measure weight once  A week    #Folowup  - 9 months or sooner  - PFT at followup   OV 04/25/2013  Overall well. Present swith wife.   Main issue, Last 6 months, insidious worsneing of dry cough. Is still mild to moderate. Random. No aggravating or releiving factors. Day and nnight. No associated GERD or sinus or wheeze. Denies ACE inhibitor intake. Asssociated dyspnea is mild and class 2 and is stable/unchanged. [name] s/p rehab and is  Currently on prednisone for RA. Says joint doisease is asymptomatic and stable   In termerms of PFT; FVC worse but DLCO unchanged   PFTs 01/28/2012:  FVC 2.3L/59%,, Ratop 84, TLC 3.6L/64%, and DLCO 14/60% - c/w Restriction  PFT  04/25/13: FVC 2.1L/62%, Ratio 85/110%, TLC 3.47L/55%, DLCO 17.2/63%  PFTs 01/23/14: FVC 2.24L/68%, Ratio 88,  TLC 4.28/68%, DLCO 17.9/66% In terms of weight loss  - 6# weigh tloss in 9 months; inttentional  #Cough  - could be from nasal drainage, lung scarring and lisinopril  - because this is mild, no change in current therapy  - continue generic fluticasone inhaler 2 squirts each nostril daily x 2 months - continue OTC prilsec daily x 2 months  #Pulmonary Fibrosis  - UIP due to RA - Clinically stable - continue prednisone and  immuran per [name] - - continue oxygen with sleep and exertion  -high dose flu shot in fall    #FOlllwup  6 months Spirometry in office at followup Walk test at followup  OV 01/18/2014  Chief Complaint  Patient presents with  . Follow-up    pt is on abx for URI from PCP. he is currently on 5 mg twice daily for this as well.    FU Mulitfactorial chronic cough, UIP due to RA, ex smoker. HEre with wife  Cough: improved  UIP due to RA: According to him his dyspnea stable but his wife says that a few weeks ago he started getting more dyspneic. More dyspnea than baseline. Insidious worsening. It was progressive. Is no associated cough or phlegm. 4 days ago he saw his primary care physician Steven. Ashby Dawes and was given some antibiotics which then has resolved his dyspnea back to baseline. Currently he is dyspneic just walking from the front door to the rash area with the trash can. Dyspnea is relieved by rest. Associated cough is minimal. There is no associated chest pain, orthopnea, paroxysmal nocturnal dyspnea, edema, hemoptysis . He continues his Imuran and prednisone compliantly. Last CT scan of the chest was in 2013 March  Obesity: lost weight intentionally by another 5 pounds since last visit  Smoking history:   reports that he quit smoking about 29 years ago. His smoking use included Cigarettes. He has a 5 pack-year smoking history. He has never used smokeless tobacco. Last CT scan of the chest was in March 2013. No further CT scans of the chest especially  with cancer screening  #Pulmonary Fibrosis  - UIP due to RA - Clinically could be worse - continue prednisone and immuran per [name] - - continue oxygen with sleep and exertion - High Resolution CT chest without contrast on ILD protocol. Only  Steven Lorin Picket or Steven. Vinnie Langton to read - do Full PFT - do walk test  #Poor dentition  -  Please see a dentist ASAP   #FOlllwup  Return to see me after above  OV  02/01/2014  Chief Complaint  Patient presents with  . Follow-up    discuss results of CT and PFT.    here to discuss test results  #UIP due to rheumatoid arthritis:   Ct chest January 23, 2014  IMPRESSION: 1. The appearance of the chest is again compatible with underlying interstitial lung disease, and although the distribution is slightly atypical, the overall appearance is most compatible with usual interstitial pneumonia (UIP). Disease has progressed compared to the prior examination.  PFT 01/23/14 - STABLe - see below. PFT is stable x 2year   PFTs 01/28/2012:  FVC 2.3L/59%,, Ratop 84, TLC 3.6L/64%, and DLCO 14/60% - c/w Restriction  PFT  04/25/13: FVC 2.1L/62%, Ratio 85/110%, TLC 3.47L/55%, DLCO 17.2/63%  PFTs 01/23/14: FVC 2.24L/68%, Ratio 88,  TLC 4.28/68%, DLCO 17.9/66%   #lung cancer screen  - no nodule on Ct March 2015  #nfew finding - CT march 2015  . Atherosclerosis, including left anterior descending coronary artery disease. Please note that although the presence of coronary artery calcium documents the presence of coronary artery disease, the severity of this disease and any potential stenosis cannot be assessed on this non-gated CT examination. Assessment for potential risk factor modification, dietary therapy or pharmacologic therapy may be warranted, if clinically indicated.  - denies chest pain or cardiac workup in past   OV 10/01/2014  Chief Complaint  Patient presents with  . Follow-up    Pt here after PFT. Pt states his breathing is unchanged since last OV. Pt states he has an increase in dry cough at night. Pt denies CP/tightness.    Follow-up pulmonary fibrosis UIP pattern secondary to rheumatoid arthritis. Last visit pulmonary function test was stable but CT scans suggested worsening. That was in March 2015. He reports for follow-up. He had pulmonary function test today that shows continued stability. In terms of symptoms he reports continued stability  with shortness of breath which is mild and exertional and relieved by rest. His bigger problem is chronic cough that is mild to moderate and more at night but still well tolerable however it impacts his quality-of-life a little bit. He would ideally like like to get rid of it. Cough is more at night it is dry and without any sputum production. There no other issues  PFT - STABLe - see below. PFT is stable x 3 year almost   PFTs 01/28/2012:  FVC 2.3L/59%,, Ratop 84, TLC 3.6L/64%, and DLCO 14/60% - c/w Restriction  PFT  04/25/13: FVC 2.1L/62%, Ratio 85/110%, TLC 3.47L/55%, DLCO 17.2/63%  PFTs 01/23/14: FVC 2.24L/68%, Ratio 88,  TLC 4.28/68%, DLCO 17.9/66%  PFTs 10/01/2014: FVC 2.18 L/66%, ratio 87, TLC 3.7 L/59%, DLCO 16.9/62%  Past medical history, social history and family history reviewed: No change since prior visit   OV  04/24/2015  Chief Complaint  Patient presents with  . Follow-up    Pt stated his breathing is unchanged since last OV. Pt c/o cough after eating at times and also becomes SOB while eating. Pt denies CP/tightness.     Follow-up pulmonary fibrosis UIP pattern secondary to rheumatoid arthritis. He continues on immuran and prednisone through Steven. Amil [name] .[name] Kitchen Last visit was in November 2015. He presents as usual for follow-up with his wife. He continues to be a poor historian. His main issues are  - Dyspnea: This is stable and mild according to him. But his wife reports that to have progressed. [name] not able to do some of the garden activities he used  to do in the past a year ago. Correspondingly Spirometry in the office today 04/24/2015: FVC 1.9 L/58%, FEV1 1.6 L/61% with a ratio of 83/106%. FEC shows a decline compared to baseline. However Walking desaturation test in the office 185 feet 3 laps today on room air: He did not desaturate below 88%. In the past he has decline in the office with exertion. Therefore there is conflicting data about his progression . LAst CT chest spring  2015  - Chronic cough: He reported chronic cough for the first time for noticeable amount 6 months ago in November 2015. At that time I recommended that he try NaSal steroid-induced and acid reflux treatment and call me back in 1 month to report progress. It is unclear at this point whether he tried nasal stent or antiacid reflux treatment. Both his wife and he cannot remember. It appears that he might of at least tried nasal sterile without any benefit. Cough is reported as mild to moderate. It happens immediately after eating but not at other times. [In fibrosis we would expect constant cough]. His wife thinks that he might be underplaying his symptom severely. He sees gastroenterologist Steven. Amedeo Plenty but has not seen him in a long time   OV 07/29/2015  Chief Complaint  Patient presents with  . Follow-up    Pt states breathing is unchanged since last visit. Pt here to discuss PFT results    Follow-up interstitial lung disease UIP pattern secondary to rheumatoid arthritis. On Imuran and prednisone. Used to see Steven. Amil [name] but he has left the practice and that is a Steven. Dossie Der who is now in that practice patient will follow up with that doctor. Last visit 3 months ago patient and wife concerned about worsening shortness of breath and cough. Therefore today is to review pulmonary function tests and CT scan results. Both show continued stability.  New finding his right lower lobe pulmonary nodule 7 mm on CT scan of the chest September 2016.  Otherwise he is feeling the same. Cough is no longer an issue as much as in the past   Immunization History  Administered Date(s) Administered  . Influenza Split 08/03/2011, 07/03/2012, 09/02/2013, 07/03/2014  . Pneumococcal Polysaccharide-23 11/03/2007     Current outpatient prescriptions:  .  albuterol (PROAIR HFA) 108 (90 BASE) MCG/ACT inhaler, Inhale 2 puffs into the lungs every 6 (six) hours as needed., Disp: 1 Inhaler, Rfl: 3 .  amLODipine (NORVASC) 10 MG  tablet, Take 10 mg by mouth daily., Disp: , Rfl:  .  aspirin 81 MG tablet, Take 81 mg by mouth daily., Disp: , Rfl:  .  azaTHIOprine (IMURAN) 50 MG tablet, Take 2 in am and 1 in pm, Disp: , Rfl:  .  folic acid (FOLVITE) 010 MCG tablet, Take 400 mcg by mouth daily., Disp: , Rfl:  .  levothyroxine (SYNTHROID, LEVOTHROID) 25 MCG tablet, Take 25 mcg by mouth daily., Disp: , Rfl:  .  metFORMIN (GLUCOPHAGE-XR) 500 MG 24 hr tablet, Take 1 tablet by mouth 2 (two) times daily., Disp: , Rfl:  .  NOVOLIN N RELION 100 UNIT/ML injection, 22 Units. 20 units in morning,22 units in evening., Disp: , Rfl:  .  NOVOLIN R RELION 100 UNIT/ML injection, Inject 8 Units into the skin every morning. , Disp: , Rfl:  .  predniSONE (DELTASONE) 1 MG tablet, Take 5 mg by mouth 2 (two) times daily. , Disp: , Rfl:     Review of Systems  Constitutional: Negative for fever, chills,  activity change, appetite change and unexpected weight change.  HENT: Negative for congestion, dental problem, postnasal drip, rhinorrhea, sneezing, sore throat, trouble swallowing and voice change.   Eyes: Negative for visual disturbance.  Respiratory: Positive for cough, shortness of breath and wheezing. Negative for choking.   Cardiovascular: Positive for chest pain. Negative for leg swelling.  Gastrointestinal: Negative for nausea, vomiting and abdominal pain.  Genitourinary: Negative for difficulty urinating.  Musculoskeletal: Negative for arthralgias.  Skin: Negative for rash.  Psychiatric/Behavioral: Negative for behavioral problems and confusion.       Objective:   Physical Exam  Filed Vitals:   07/29/15 1136  BP: 140/72  Pulse: 103  Height: 5' 6" (1.676 m)  Weight: 210 lb 3.2 oz (95.346 kg)  SpO2: 91%   Discussion only visit      Assessment & Plan:     ICD-9-CM ICD-10-CM   1. 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) 515 J84.10 CT Chest Wo Contrast  2. Nodule of right lung 793.11 R91.1 CT Chest Wo Contrast    #Pulmonary Fibrosis  - UIP due to RA - clinically stable and stable on CT and PFT test snice 2015 and maybe even 2012 - flu shot 07/29/2015  PLAN - continue prednisone and immuran per [name] - - continue oxygen with sleep and exertion  #Lung nodule Right Lower lobe 4m - Sept 2016  - do followup CT ches wo contrast in 6 months  Followup  - Return to see me in  6 months after completing CT chest  (> 50% of this 15 min visit spent in face to face counseling or/and coordination of care)   Steven. MBrand Males [name], FBedford County Medical CenterC.P Pulmonary and Critical Care Medicine Staff Physician CMontaguePulmonary and Critical Care Pager: [phone] If no answer or between  15:00h - 7:00h: call [phone]  07/29/2015 12:09 PM

## 2015-07-29 NOTE — Patient Instructions (Addendum)
#  Pulmonary Fibrosis  - UIP due to RA - clinically stable and stable on CT and PFT test snice 2015 and maybe even 2012 - flu shot 07/29/2015  PLAN - continue prednisone and immuran per [name] - - continue oxygen with sleep and exertion  #Lung nodule Right Lower lobe 48m - Sept 2016  - do followup CT ches wo contrast in 6 months  Followup  - Return to see me in  6 months after completing CT chest

## 2015-08-03 DIAGNOSIS — R0902 Hypoxemia: Secondary | ICD-10-CM | POA: Diagnosis not present

## 2015-08-13 DIAGNOSIS — E782 Mixed hyperlipidemia: Secondary | ICD-10-CM | POA: Diagnosis not present

## 2015-08-13 DIAGNOSIS — Z1389 Encounter for screening for other disorder: Secondary | ICD-10-CM | POA: Diagnosis not present

## 2015-08-13 DIAGNOSIS — K219 Gastro-esophageal reflux disease without esophagitis: Secondary | ICD-10-CM | POA: Diagnosis not present

## 2015-08-13 DIAGNOSIS — M150 Primary generalized (osteo)arthritis: Secondary | ICD-10-CM | POA: Diagnosis not present

## 2015-08-13 DIAGNOSIS — E1065 Type 1 diabetes mellitus with hyperglycemia: Secondary | ICD-10-CM | POA: Diagnosis not present

## 2015-08-13 DIAGNOSIS — I10 Essential (primary) hypertension: Secondary | ICD-10-CM | POA: Diagnosis not present

## 2015-08-20 DIAGNOSIS — Z0000 Encounter for general adult medical examination without abnormal findings: Secondary | ICD-10-CM | POA: Diagnosis not present

## 2015-08-20 DIAGNOSIS — E1065 Type 1 diabetes mellitus with hyperglycemia: Secondary | ICD-10-CM | POA: Diagnosis not present

## 2015-08-20 DIAGNOSIS — Z23 Encounter for immunization: Secondary | ICD-10-CM | POA: Diagnosis not present

## 2015-08-20 DIAGNOSIS — E782 Mixed hyperlipidemia: Secondary | ICD-10-CM | POA: Diagnosis not present

## 2015-08-20 DIAGNOSIS — I10 Essential (primary) hypertension: Secondary | ICD-10-CM | POA: Diagnosis not present

## 2015-09-03 DIAGNOSIS — R0902 Hypoxemia: Secondary | ICD-10-CM | POA: Diagnosis not present

## 2015-10-03 DIAGNOSIS — R0902 Hypoxemia: Secondary | ICD-10-CM | POA: Diagnosis not present

## 2015-11-03 DIAGNOSIS — R0902 Hypoxemia: Secondary | ICD-10-CM | POA: Diagnosis not present

## 2015-11-26 DIAGNOSIS — E782 Mixed hyperlipidemia: Secondary | ICD-10-CM | POA: Diagnosis not present

## 2015-11-26 DIAGNOSIS — E1065 Type 1 diabetes mellitus with hyperglycemia: Secondary | ICD-10-CM | POA: Diagnosis not present

## 2015-11-26 DIAGNOSIS — I10 Essential (primary) hypertension: Secondary | ICD-10-CM | POA: Diagnosis not present

## 2015-12-03 DIAGNOSIS — I10 Essential (primary) hypertension: Secondary | ICD-10-CM | POA: Diagnosis not present

## 2015-12-03 DIAGNOSIS — E1065 Type 1 diabetes mellitus with hyperglycemia: Secondary | ICD-10-CM | POA: Diagnosis not present

## 2015-12-03 DIAGNOSIS — E782 Mixed hyperlipidemia: Secondary | ICD-10-CM | POA: Diagnosis not present

## 2015-12-04 DIAGNOSIS — R0902 Hypoxemia: Secondary | ICD-10-CM | POA: Diagnosis not present

## 2015-12-26 DIAGNOSIS — M0589 Other rheumatoid arthritis with rheumatoid factor of multiple sites: Secondary | ICD-10-CM | POA: Diagnosis not present

## 2015-12-26 DIAGNOSIS — Z7952 Long term (current) use of systemic steroids: Secondary | ICD-10-CM | POA: Diagnosis not present

## 2015-12-26 DIAGNOSIS — M150 Primary generalized (osteo)arthritis: Secondary | ICD-10-CM | POA: Diagnosis not present

## 2015-12-26 DIAGNOSIS — J8410 Pulmonary fibrosis, unspecified: Secondary | ICD-10-CM | POA: Diagnosis not present

## 2016-01-01 DIAGNOSIS — R0902 Hypoxemia: Secondary | ICD-10-CM | POA: Diagnosis not present

## 2016-01-20 ENCOUNTER — Inpatient Hospital Stay: Admission: RE | Admit: 2016-01-20 | Payer: Medicare Other | Source: Ambulatory Visit

## 2016-01-20 ENCOUNTER — Ambulatory Visit (INDEPENDENT_AMBULATORY_CARE_PROVIDER_SITE_OTHER)
Admission: RE | Admit: 2016-01-20 | Discharge: 2016-01-20 | Disposition: A | Discharge status: Home / Self Care | Payer: Medicare Other | Source: Ambulatory Visit | Attending: Internal Medicine | Admitting: Internal Medicine

## 2016-01-20 DIAGNOSIS — R911 Solitary pulmonary nodule: Secondary | ICD-10-CM

## 2016-01-20 DIAGNOSIS — R918 Other nonspecific abnormal finding of lung field: Secondary | ICD-10-CM | POA: Diagnosis not present

## 2016-01-20 DIAGNOSIS — J8410 Pulmonary fibrosis, unspecified: Secondary | ICD-10-CM | POA: Diagnosis not present

## 2016-01-27 ENCOUNTER — Ambulatory Visit: Payer: Medicare Other | Admitting: Internal Medicine

## 2016-01-27 ENCOUNTER — Telehealth: Payer: Self-pay | Admitting: Internal Medicine

## 2016-01-27 NOTE — Telephone Encounter (Signed)
LMTCB for pt.

## 2016-01-27 NOTE — Telephone Encounter (Signed)
  IMPRESSION: ct chest 1. Resolution of right lower lobe pulmonary nodule. No new pulmonary lesions or acute pulmonary findings. 2. Stable changes of interstitial lung disease with fairly extensive honeycombing. 3. Stable mediastinal and hilar lymph nodes and collateral vasculature. 4. Stable right thyroid goiter.   Electronically Signed By: Marijo Sanes [name] On: 01/20/2016 09:35

## 2016-01-28 NOTE — Telephone Encounter (Signed)
Called and spoke to pt. Informed him of the results per MR. Pt verbalized understanding and denied any further questions or concerns at this time.

## 2016-01-30 ENCOUNTER — Ambulatory Visit (INDEPENDENT_AMBULATORY_CARE_PROVIDER_SITE_OTHER): Payer: Medicare Other | Admitting: Internal Medicine

## 2016-01-30 ENCOUNTER — Encounter: Payer: Self-pay | Admitting: Internal Medicine

## 2016-01-30 VITALS — BP 158/70 | HR 84 | Ht 66.0 in | Wt 209.0 lb

## 2016-01-30 DIAGNOSIS — J8410 Pulmonary fibrosis, unspecified: Secondary | ICD-10-CM

## 2016-01-30 DIAGNOSIS — R05 Cough: Secondary | ICD-10-CM

## 2016-01-30 DIAGNOSIS — R053 Chronic cough: Secondary | ICD-10-CM

## 2016-01-30 NOTE — Patient Instructions (Addendum)
#  Pulmonary Fibrosis  - UIP due to RA - clinically stable and stable on CT march 2017 and PFT sept 2016  - stable since 2015 and maybe even 2012   PLAN - continue prednisone and immuran per [name] - - continue oxygen with sleep and exertion  #Lung nodule Right Lower lobe 21m - Sept 2016 - > resolved March 2017  - no followup CT chest   Followup  - full PFT in 9 months  - Return to see me in  9 months after PFT

## 2016-01-30 NOTE — Progress Notes (Signed)
OV 12/29/2011  70 year male.  reports that he quit smoking about 27 years ago. His smoking use included Cigarettes. He has a 5 pack-year smoking history. He has never used smokeless tobacco. Body mass index is 36.25 kg/(m^2).   OV 12/29/2011 70 year old male with Rheumatoid ARthritis ([name]) for several years. Was being treated with prednsione 44m, methotrexate and folic acid all since diagnosis several years ago. Diagnosis was only in joints. Then 1-2 month ago noticed insidious onset dyspnea but he thinks this was due to 'double pneumonia' (denies dyspnea during xmas 2012 or thanksgving 2012). Says he was diagnosed with pneumonia and a month later feels opd abx did not work and he was admitted 2/12 - 12/20/11 due to worsening dyspnea. 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 He developed increased DOE to were he was SOB walking >15 feet. Normally prior to 1 month age he had no limitation of activities(confirmed by family). + for night sweats but no fever, chills or purulent sputum. He went to his PCP Dr. RJillyn Ledgerand was dx with Pna and treated with 2 rounds of Avelox and Prednisone without any improvement in sxs. He has a appointment with Dr. RChase Callerof pulmonary but [facility] to [facility]. Report to have to have UIP on ct scan. CxR with ?progressive pna and PCCM asked to evaluate Pm 2/12. Assessment Hypoic resp failure in 70yo AAM with recent URI with radigraphic changes non specific in pt with RA on MTX . Plan  O2 as needed, BD's , steroids , -check esr/bnp may need 2 d echo  And D/C MTX with the thought being that " the fact his RA is well controlled while resp status is worsening over > one month strongly favors mtx lung over RA lung dz and CAP "  Methotrexated stopped but still on prednisone  but at 584mper day and asked to keep original pulm appt with me. Currently feels that he has no dyspnea. However, resting pulse ox on  Room air today 12/29/11 shows pulse ox 88% and he is silently hypoxic. REview of records show ESR 48 on 12/15/11, no eosinophilia. ECHO 12/16/11 shows ef 65% with normal RV. CT chest 2/613: shows UIP pattern but also some GGO. BNP < 2.5 on 11/24/11. Creat 74m85mon 11/16/11  REC You do have lung damage from Rheumatoid - it is called pulmonary fibrosis  I am not sure if back in feb 2013 if you had methtotrexate lung pneumonitis or due to virus. It could have been either. So, for now stay off methotrexate  Continue prednisone at current dose; [name] adjust  You might need another medication for RA; I will talk to DR BeeAmil Amenout this  For your lungs, you need oxygen 24/7 2L [place] for now and attend pulmonary rehab - nurse will do referral  Return in 6 weeks with CT chest and full PFT breathing test  Any problems come sooner   Telephone call 01/04/12 : D/w Dr BeeAmil Amenxpressed my impression that this could be primary RA relatd pulmonary disease   - enbrel would be great but not an option due to cost and medicare  - He agrees for now hold off on methotrexate depending on ct and  pft in 6 weeks  - he will consider arava depending on ct results   OV 01/28/2012  No complaints.Started on [place]. Wants to be able to  go fishing. [facility]arted pulm rehab at [facility]   PFTs 01/28/2012  - FVC 2.3L/59%,, Ratop 84, TLC 3.6L/64%, and DLCO 14/60% - c/w Restriction   Ct Chest Wo Contrast  01/28/2012  *RADIOLOGY REPORT*  Clinical Data: Evaluate for pulmonary fibrosis  CT CHEST WITHOUT CONTRAST  Technique:  Multidetector CT imaging of the chest was performed following the standard protocol without IV contrast.  Comparison: 12/09/2011  Findings: Enlarged mediastinal lymph nodes are identified.  The largest is in the precarinal lymph node which measures 1.3 cm.  No pericardial or pleural  effusion.  There are bilateral areas of bronchiectasis.  Most severe in the right lower lobe.  Interstitial reticulation and honeycombing is identified.  Most severe in the right lower lobe.  No airspace consolidation identified.  No suspicious pulmonary nodule or mass.  Review of the visualized osseous structures is significant for mild multilevel degenerative disc disease.  Limited imaging through the upper abdomen is negative.  IMPRESSION:  1.  Exam is positive for pulmonary fibrosis or usual interstitial pneumonitis (UIP). 2.  Enlarged mediastinal lymph nodes are nonspecific in the setting of interstitial lung disease.  Original Report Authenticated By: [name], [name]   REC You do have lung damage from Rheumatoid - it is called pulmonary fibrosis  There is no cure for this disease and can get worse over time  Continue prednisone at current dose; [name] to adjust  [facility] to start [place]: I will talk to [name] about this  For your lungs, you need oxygen with exertion and sleep but as of today no need to take it at rest  REturn in 6 months  Walk test at followup   OV 07/26/2012 Followup Pulmonary fibrosis on basis of RA. Presents with wife. Overall well. , Not doing rehab: no benefit. Has not had flu shot but will have it. Wants walk test. [name] concerned about overall health and his obesity. His eating habits are very poor. Diet is largely moderate and high glycemic foods involving bread, corn, fried foods and potatoes. He and wife do want to lose weight. They want to start at [facility] but have no dietary plan. In terms of RA, tolerating arava and prednisone directed by [name]  Past, Family, Social reviewed: no change since last visit  #Pulmonary Fibrosis - Clinically stable - Have flu shot today  - Next pneumonia shot is in 2014 (because you had last one in 2009) - Walk test today - continue oxygen with sleep and exertion  - Join [employer] - Continue Rheumatoid medications per Dr  Amil Amen  #WEight Mgmt  -  we discussed extensively about weight management   - follow low glycemic diet plan that I outlined for you after extensive discussion  - For breakfast   - recommend steel cut oat meal or fiber one 60 cal (half to one cup) with low sugar 60 cal silk soymilk and some berries and one egg  - snack with berries or low glycemic fruit or mens health 35gm mixed-nut pack  - lunch and dinner - unlimited non-starchy vegetable (prefer raw fresh or roasted or grilled) with skinless chicken or fish   - drink lot of water  - avoid all moderate and high glycemic foods  - measure weight once  A week    #Folowup  -  9 months or sooner  - PFT at followup   OV 04/25/2013  Overall well. Present swith wife.   Main issue, Last 6 months, insidious worsneing of dry cough. Is still mild to moderate. Random. No aggravating or releiving factors. Day and nnight. No associated GERD or sinus or wheeze. Denies ACE inhibitor intake. Asssociated dyspnea is mild and class 2 and is stable/unchanged. [name] s/p rehab and is  Currently on prednisone for RA. Says joint doisease is asymptomatic and stable   In termerms of PFT; FVC worse but DLCO unchanged   PFTs 01/28/2012:  FVC 2.3L/59%,, Ratop 84, TLC 3.6L/64%, and DLCO 14/60% - c/w Restriction  PFT  04/25/13: FVC 2.1L/62%, Ratio 85/110%, TLC 3.47L/55%, DLCO 17.2/63%  PFTs 01/23/14: FVC 2.24L/68%, Ratio 88,  TLC 4.28/68%, DLCO 17.9/66% In terms of weight loss  - 6# weigh tloss in 9 months; inttentional  #Cough  - could be from nasal drainage, lung scarring and lisinopril  - because this is mild, no change in current therapy  - continue generic fluticasone inhaler 2 squirts each nostril daily x 2 months - continue OTC prilsec daily x 2 months  #Pulmonary Fibrosis  - UIP due to RA - Clinically stable - continue prednisone and immuran per [name] - - continue oxygen with sleep and exertion  -high dose flu shot in fall    #FOlllwup  6  months Spirometry in office at followup Walk test at followup  OV 01/18/2014  Chief Complaint  Patient presents with  . Follow-up    pt is on abx for URI from PCP. he is currently on 5 mg twice daily for this as well.    FU Mulitfactorial chronic cough, UIP due to RA, ex smoker. HEre with wife  Cough: improved  UIP due to RA: According to him his dyspnea stable but his wife says that a few weeks ago he started getting more dyspneic. More dyspnea than baseline. Insidious worsening. It was progressive. Is no associated cough or phlegm. 4 days ago he saw his primary care physician Dr. Ashby Dawes and was given some antibiotics which then has resolved his dyspnea back to baseline. Currently he is dyspneic just walking from the front door to the rash area with the trash can. Dyspnea is relieved by rest. Associated cough is minimal. There is no associated chest pain, orthopnea, paroxysmal nocturnal dyspnea, edema, hemoptysis . He continues his Imuran and prednisone compliantly. Last CT scan of the chest was in 2013 March  Obesity: lost weight intentionally by another 5 pounds since last visit  Smoking history:   reports that he quit smoking about 29 years ago. His smoking use included Cigarettes. He has a 5 pack-year smoking history. He has never used smokeless tobacco. Last CT scan of the chest was in March 2013. No further CT scans of the chest especially  with cancer screening  #Pulmonary Fibrosis  - UIP due to RA - Clinically could be worse - continue prednisone and immuran per [name] - - continue oxygen with sleep and exertion - High Resolution CT chest without contrast on ILD protocol. Only  Dr Lorin Picket or Dr. Vinnie Langton to read - do Full PFT - do walk test  #Poor dentition  -  Please see a dentist ASAP   #FOlllwup  Return to see me after above  OV 02/01/2014  Chief Complaint  Patient presents with  . Follow-up    discuss results of CT and PFT.    here to  discuss test results  #UIP due to rheumatoid arthritis:   Ct chest January 23, 2014  IMPRESSION: 1. The appearance of the chest is again compatible with underlying interstitial lung disease, and although the distribution is slightly atypical, the overall appearance is most compatible with usual interstitial pneumonia (UIP). Disease has progressed compared to the prior examination.  PFT 01/23/14 - STABLe - see below. PFT is stable x 2year   PFTs 01/28/2012:  FVC 2.3L/59%,, Ratop 84, TLC 3.6L/64%, and DLCO 14/60% - c/w Restriction  PFT  04/25/13: FVC 2.1L/62%, Ratio 85/110%, TLC 3.47L/55%, DLCO 17.2/63%  PFTs 01/23/14: FVC 2.24L/68%, Ratio 88,  TLC 4.28/68%, DLCO 17.9/66%   #lung cancer screen  - no nodule on Ct March 2015  #nfew finding - CT march 2015  . Atherosclerosis, including left anterior descending coronary artery disease. Please note that although the presence of coronary artery calcium documents the presence of coronary artery disease, the severity of this disease and any potential stenosis cannot be assessed on this non-gated CT examination. Assessment for potential risk factor modification, dietary therapy or pharmacologic therapy may be warranted, if clinically indicated.  - denies chest pain or cardiac workup in past   OV 10/01/2014  Chief Complaint  Patient presents with  . Follow-up    Pt here after PFT. Pt states his breathing is unchanged since last OV. Pt states he has an increase in dry cough at night. Pt denies CP/tightness.    Follow-up pulmonary fibrosis UIP pattern secondary to rheumatoid arthritis. Last visit pulmonary function test was stable but CT scans suggested worsening. That was in March 2015. He reports for follow-up. He had pulmonary function test today that shows continued stability. In terms of symptoms he reports continued stability with shortness of breath which is mild and exertional and relieved by rest. His bigger problem is chronic cough  that is mild to moderate and more at night but still well tolerable however it impacts his quality-of-life a little bit. He would ideally like like to get rid of it. Cough is more at night it is dry and without any sputum production. There no other issues  PFT - STABLe - see below. PFT is stable x 3 year almost   PFTs 01/28/2012:  FVC 2.3L/59%,, Ratop 84, TLC 3.6L/64%, and DLCO 14/60% - c/w Restriction  PFT  04/25/13: FVC 2.1L/62%, Ratio 85/110%, TLC 3.47L/55%, DLCO 17.2/63%  PFTs 01/23/14: FVC 2.24L/68%, Ratio 88,  TLC 4.28/68%, DLCO 17.9/66%  PFTs 10/01/2014: FVC 2.18 L/66%, ratio 87, TLC 3.7 L/59%, DLCO 16.9/62%  Past medical history, social history and family history reviewed: No change since prior visit   OV  04/24/2015  Chief Complaint  Patient presents with  . Follow-up    Pt stated his breathing is unchanged since last OV. Pt c/o cough after eating at times and also becomes SOB while eating. Pt denies CP/tightness.     Follow-up pulmonary fibrosis UIP pattern secondary to rheumatoid arthritis. He continues on immuran and prednisone through Dr. Amil Amen .[name] Kitchen Last visit was in November 2015. He presents as usual for follow-up with his wife. He continues to be a poor historian. His main issues are  - Dyspnea: This is stable and mild according to him. But his wife reports that to have progressed. [name] not able to do some of the garden activities he used to do in the past a year ago. Correspondingly Spirometry in the office today 04/24/2015: FVC 1.9 L/58%, FEV1 1.6 L/61% with a ratio of 83/106%. FEC  shows a decline compared to baseline. However Walking desaturation test in the office 185 feet 3 laps today on room air: He did not desaturate below 88%. In the past he has decline in the office with exertion. Therefore there is conflicting data about his progression . LAst CT chest spring 2015  - Chronic cough: He reported chronic cough for the first time for noticeable amount 6 months ago in  November 2015. At that time I recommended that he try NaSal steroid-induced and acid reflux treatment and call me back in 1 month to report progress. It is unclear at this point whether he tried nasal stent or antiacid reflux treatment. Both his wife and he cannot remember. It appears that he might of at least tried nasal sterile without any benefit. Cough is reported as mild to moderate. It happens immediately after eating but not at other times. [In fibrosis we would expect constant cough]. His wife thinks that he might be underplaying his symptom severely. He sees gastroenterologist Dr. Amedeo Plenty but has not seen him in a long time   OV 07/29/2015  Chief Complaint  Patient presents with  . Follow-up    Pt states breathing is unchanged since last visit. Pt here to discuss PFT results    Follow-up interstitial lung disease UIP pattern secondary to rheumatoid arthritis. On Imuran and prednisone. Used to see Dr. Amil Amen but he has left the practice and that is a Dr. Dossie Der who is now in that practice patient will follow up with that doctor. Last visit 3 months ago patient and wife concerned about worsening shortness of breath and cough. Therefore today is to review pulmonary function tests and CT scan results. Both show continued stability.  New finding his right lower lobe pulmonary nodule 7 mm on CT scan of the chest September 2016.  Otherwise he is feeling the same. Cough is no longer an issue as much as in the past     OV 01/30/2016  Chief Complaint  Patient presents with  . Follow-up    Pt here after CT chest. Pt states his breathing is unchanged since last OV in 07/2015. Pt c/o dry cough. Pt deines CP/tightness, wheezing, and f/c/s.    Follow-up - Right lower lobe pulmonary nodule seen on CT chest September 2016 - > he had CT chest March 2017 and this nodule has resolved  - Follow-up rheumatoid arthritis with UIP pattern interstitial lung disease secondary to rheumatoid arthritis. On Imuran  and prednisone. [name] on follow-up with rheumatologist Dr. Dossie Der at [facility]. Since last visit no new medical issues. Not much of a cough. Tolerating immunotherapy well. Not much of shortness of breath. Overall stable. Main issue is visible obesity.    has a past medical history of Hypertension; Diabetes mellitus; Arthritis; Thyroid disease; and Pneumonia.   reports that he quit smoking about 31 years ago. His smoking use included Cigarettes. He has a 5 pack-year smoking history. He has never used smokeless tobacco.  Past Surgical History  Procedure Laterality Date  . No past surgeries    . Knee arthroscopy      No Known Allergies  Immunization History  Administered Date(s) Administered  . Influenza Split 08/03/2011, 07/03/2012, 09/02/2013, 07/03/2014  . Influenza,inj,Quad PF,36+ Mos 07/29/2015  . Pneumococcal Polysaccharide-23 11/03/2007    Family History  Problem Relation Age of Onset  . Diabetes Mother   . Stroke Mother      Current outpatient prescriptions:  .  albuterol (PROAIR HFA) 108 (90  BASE) MCG/ACT inhaler, Inhale 2 puffs into the lungs every 6 (six) hours as needed., Disp: 1 Inhaler, Rfl: 3 .  amLODipine (NORVASC) 10 MG tablet, Take 10 mg by mouth daily., Disp: , Rfl:  .  aspirin 81 MG tablet, Take 81 mg by mouth daily., Disp: , Rfl:  .  azaTHIOprine (IMURAN) 50 MG tablet, Take 2 in am and 1 in pm, Disp: , Rfl:  .  folic acid (FOLVITE) 696 MCG tablet, Take 400 mcg by mouth daily., Disp: , Rfl:  .  levothyroxine (SYNTHROID, LEVOTHROID) 25 MCG tablet, Take 25 mcg by mouth daily., Disp: , Rfl:  .  metFORMIN (GLUCOPHAGE-XR) 500 MG 24 hr tablet, Take 1 tablet by mouth 2 (two) times daily., Disp: , Rfl:  .  predniSONE (DELTASONE) 1 MG tablet, Take 5 mg by mouth 2 (two) times daily. , Disp: , Rfl:  .  NOVOLIN N RELION 100 UNIT/ML injection, 22 Units. 20 units in morning,22 units in evening., Disp: , Rfl:  .  NOVOLIN R RELION 100 UNIT/ML injection,  Inject 8 Units into the skin every morning. , Disp: , Rfl:     A/p   ICD-9-CM ICD-10-CM   1. 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) 515 J84.10   2. Chronic cough 786.2 R05      #Pulmonary Fibrosis  - UIP due to RA - clinically stable and stable on CT march 2017 and PFT sept 2016  - stable since 2015 and maybe even 2012   PLAN - continue prednisone and immuran per [name] - - continue oxygen with sleep and exertion  #Lung nodule Right Lower lobe 33m - Sept 2016 - > resolved March 2017  - no followup CT chest   Followup  - full PFT in 9 months  - Return to see me in  9 months after PFT    (> 50% of this 15 min visit spent in face to face counseling or/and coordination of care)   Dr. MBrand Males [name], FSt Thomas Medical Group [facility] LLCC.P Pulmonary and Critical Care Medicine Staff Physician CNowataPulmonary and Critical Care Pager: [phone] If no answer or between  15:00h - 7:00h: call [phone]  01/30/2016 11:28 AM

## 2016-02-01 DIAGNOSIS — R0902 Hypoxemia: Secondary | ICD-10-CM | POA: Diagnosis not present

## 2016-02-25 DIAGNOSIS — E1065 Type 1 diabetes mellitus with hyperglycemia: Secondary | ICD-10-CM | POA: Diagnosis not present

## 2016-02-25 DIAGNOSIS — E782 Mixed hyperlipidemia: Secondary | ICD-10-CM | POA: Diagnosis not present

## 2016-02-25 DIAGNOSIS — I10 Essential (primary) hypertension: Secondary | ICD-10-CM | POA: Diagnosis not present

## 2016-02-25 DIAGNOSIS — Z79899 Other long term (current) drug therapy: Secondary | ICD-10-CM | POA: Diagnosis not present

## 2016-03-02 DIAGNOSIS — I10 Essential (primary) hypertension: Secondary | ICD-10-CM | POA: Diagnosis not present

## 2016-03-02 DIAGNOSIS — E782 Mixed hyperlipidemia: Secondary | ICD-10-CM | POA: Diagnosis not present

## 2016-03-02 DIAGNOSIS — E1065 Type 1 diabetes mellitus with hyperglycemia: Secondary | ICD-10-CM | POA: Diagnosis not present

## 2016-03-02 DIAGNOSIS — R0902 Hypoxemia: Secondary | ICD-10-CM | POA: Diagnosis not present

## 2016-04-02 DIAGNOSIS — R0902 Hypoxemia: Secondary | ICD-10-CM | POA: Diagnosis not present

## 2016-04-21 DIAGNOSIS — M150 Primary generalized (osteo)arthritis: Secondary | ICD-10-CM | POA: Diagnosis not present

## 2016-04-21 DIAGNOSIS — J8410 Pulmonary fibrosis, unspecified: Secondary | ICD-10-CM | POA: Diagnosis not present

## 2016-04-21 DIAGNOSIS — M0589 Other rheumatoid arthritis with rheumatoid factor of multiple sites: Secondary | ICD-10-CM | POA: Diagnosis not present

## 2016-05-02 DIAGNOSIS — R0902 Hypoxemia: Secondary | ICD-10-CM | POA: Diagnosis not present

## 2016-06-02 DIAGNOSIS — R0902 Hypoxemia: Secondary | ICD-10-CM | POA: Diagnosis not present

## 2016-07-02 DIAGNOSIS — E1065 Type 1 diabetes mellitus with hyperglycemia: Secondary | ICD-10-CM | POA: Diagnosis not present

## 2016-07-02 DIAGNOSIS — E782 Mixed hyperlipidemia: Secondary | ICD-10-CM | POA: Diagnosis not present

## 2016-07-03 DIAGNOSIS — R0902 Hypoxemia: Secondary | ICD-10-CM | POA: Diagnosis not present

## 2016-07-15 DIAGNOSIS — E1065 Type 1 diabetes mellitus with hyperglycemia: Secondary | ICD-10-CM | POA: Diagnosis not present

## 2016-07-15 DIAGNOSIS — E782 Mixed hyperlipidemia: Secondary | ICD-10-CM | POA: Diagnosis not present

## 2016-07-15 DIAGNOSIS — Z23 Encounter for immunization: Secondary | ICD-10-CM | POA: Diagnosis not present

## 2016-07-15 DIAGNOSIS — J8410 Pulmonary fibrosis, unspecified: Secondary | ICD-10-CM | POA: Diagnosis not present

## 2016-07-21 DIAGNOSIS — M150 Primary generalized (osteo)arthritis: Secondary | ICD-10-CM | POA: Diagnosis not present

## 2016-07-21 DIAGNOSIS — M0589 Other rheumatoid arthritis with rheumatoid factor of multiple sites: Secondary | ICD-10-CM | POA: Diagnosis not present

## 2016-07-21 DIAGNOSIS — M25569 Pain in unspecified knee: Secondary | ICD-10-CM | POA: Diagnosis not present

## 2016-07-21 DIAGNOSIS — M179 Osteoarthritis of knee, unspecified: Secondary | ICD-10-CM | POA: Diagnosis not present

## 2016-07-21 DIAGNOSIS — M19042 Primary osteoarthritis, left hand: Secondary | ICD-10-CM | POA: Diagnosis not present

## 2016-07-21 DIAGNOSIS — M129 Arthropathy, unspecified: Secondary | ICD-10-CM | POA: Diagnosis not present

## 2016-07-21 DIAGNOSIS — J8410 Pulmonary fibrosis, unspecified: Secondary | ICD-10-CM | POA: Diagnosis not present

## 2016-07-21 DIAGNOSIS — M19041 Primary osteoarthritis, right hand: Secondary | ICD-10-CM | POA: Diagnosis not present

## 2016-07-29 DIAGNOSIS — H524 Presbyopia: Secondary | ICD-10-CM | POA: Diagnosis not present

## 2016-07-29 DIAGNOSIS — H2513 Age-related nuclear cataract, bilateral: Secondary | ICD-10-CM | POA: Diagnosis not present

## 2016-07-29 DIAGNOSIS — E119 Type 2 diabetes mellitus without complications: Secondary | ICD-10-CM | POA: Diagnosis not present

## 2016-08-02 DIAGNOSIS — R0902 Hypoxemia: Secondary | ICD-10-CM | POA: Diagnosis not present

## 2016-09-02 DIAGNOSIS — R0902 Hypoxemia: Secondary | ICD-10-CM | POA: Diagnosis not present

## 2016-10-02 DIAGNOSIS — R0902 Hypoxemia: Secondary | ICD-10-CM | POA: Diagnosis not present

## 2016-10-21 DIAGNOSIS — M150 Primary generalized (osteo)arthritis: Secondary | ICD-10-CM | POA: Diagnosis not present

## 2016-10-21 DIAGNOSIS — J8410 Pulmonary fibrosis, unspecified: Secondary | ICD-10-CM | POA: Diagnosis not present

## 2016-10-21 DIAGNOSIS — Z13820 Encounter for screening for osteoporosis: Secondary | ICD-10-CM | POA: Diagnosis not present

## 2016-10-21 DIAGNOSIS — M25569 Pain in unspecified knee: Secondary | ICD-10-CM | POA: Diagnosis not present

## 2016-10-21 DIAGNOSIS — M8589 Other specified disorders of bone density and structure, multiple sites: Secondary | ICD-10-CM | POA: Diagnosis not present

## 2016-10-21 DIAGNOSIS — M0589 Other rheumatoid arthritis with rheumatoid factor of multiple sites: Secondary | ICD-10-CM | POA: Diagnosis not present

## 2016-11-02 DIAGNOSIS — R0902 Hypoxemia: Secondary | ICD-10-CM | POA: Diagnosis not present

## 2016-11-17 DIAGNOSIS — E782 Mixed hyperlipidemia: Secondary | ICD-10-CM | POA: Diagnosis not present

## 2016-11-17 DIAGNOSIS — Z125 Encounter for screening for malignant neoplasm of prostate: Secondary | ICD-10-CM | POA: Diagnosis not present

## 2016-11-17 DIAGNOSIS — E1065 Type 1 diabetes mellitus with hyperglycemia: Secondary | ICD-10-CM | POA: Diagnosis not present

## 2016-11-17 DIAGNOSIS — Z0000 Encounter for general adult medical examination without abnormal findings: Secondary | ICD-10-CM | POA: Diagnosis not present

## 2016-11-19 ENCOUNTER — Ambulatory Visit: Payer: Medicare Other | Admitting: Internal Medicine

## 2016-11-24 DIAGNOSIS — E1065 Type 1 diabetes mellitus with hyperglycemia: Secondary | ICD-10-CM | POA: Diagnosis not present

## 2016-11-24 DIAGNOSIS — M0589 Other rheumatoid arthritis with rheumatoid factor of multiple sites: Secondary | ICD-10-CM | POA: Diagnosis not present

## 2016-11-24 DIAGNOSIS — E782 Mixed hyperlipidemia: Secondary | ICD-10-CM | POA: Diagnosis not present

## 2016-11-24 DIAGNOSIS — J8410 Pulmonary fibrosis, unspecified: Secondary | ICD-10-CM | POA: Diagnosis not present

## 2016-12-02 DIAGNOSIS — K2970 Gastritis, unspecified, without bleeding: Secondary | ICD-10-CM | POA: Diagnosis not present

## 2016-12-03 DIAGNOSIS — R0902 Hypoxemia: Secondary | ICD-10-CM | POA: Diagnosis not present

## 2016-12-18 ENCOUNTER — Ambulatory Visit (INDEPENDENT_AMBULATORY_CARE_PROVIDER_SITE_OTHER): Payer: Medicare Other | Admitting: Internal Medicine

## 2016-12-18 DIAGNOSIS — R053 Chronic cough: Secondary | ICD-10-CM

## 2016-12-18 DIAGNOSIS — R05 Cough: Secondary | ICD-10-CM

## 2016-12-18 DIAGNOSIS — J8410 Pulmonary fibrosis, unspecified: Secondary | ICD-10-CM

## 2016-12-18 LAB — PULMONARY FUNCTION TEST
DL/VA % pred: 90 %
DL/VA: 3.9 ml/min/mmHg/L
DLCO cor % pred: 53 %
DLCO cor: 14.51 ml/min/mmHg
DLCO unc % pred: 51 %
DLCO unc: 13.81 ml/min/mmHg
FEF 25-75 Post: 2.97 L/sec
FEF 25-75 Pre: 2.49 L/sec
FEF2575-%Change-Post: 19 %
FEF2575-%Pred-Post: 144 %
FEF2575-%Pred-Pre: 121 %
FEV1-%Change-Post: 4 %
FEV1-%Pred-Post: 83 %
FEV1-%Pred-Pre: 80 %
FEV1-Post: 2.01 L
FEV1-Pre: 1.92 L
FEV1FVC-%Change-Post: 1 %
FEV1FVC-%Pred-Pre: 113 %
FEV6-%Change-Post: 2 %
FEV6-%Pred-Post: 74 %
FEV6-%Pred-Pre: 72 %
FEV6-Post: 2.28 L
FEV6-Pre: 2.21 L
FEV6FVC-%Pred-Post: 105 %
FEV6FVC-%Pred-Pre: 105 %
FVC-%Change-Post: 2 %
FVC-%Pred-Post: 70 %
FVC-%Pred-Pre: 68 %
FVC-Post: 2.28 L
FVC-Pre: 2.21 L
Post FEV1/FVC ratio: 88 %
Post FEV6/FVC ratio: 100 %
Pre FEV1/FVC ratio: 86 %
Pre FEV6/FVC Ratio: 100 %

## 2016-12-18 NOTE — Progress Notes (Signed)
PFT done today. [name]

## 2016-12-29 ENCOUNTER — Ambulatory Visit: Payer: Medicare Other | Admitting: Internal Medicine

## 2016-12-31 DIAGNOSIS — R0902 Hypoxemia: Secondary | ICD-10-CM | POA: Diagnosis not present

## 2017-01-01 ENCOUNTER — Encounter: Payer: Self-pay | Admitting: Internal Medicine

## 2017-01-01 ENCOUNTER — Ambulatory Visit (INDEPENDENT_AMBULATORY_CARE_PROVIDER_SITE_OTHER): Payer: Medicare Other | Admitting: Internal Medicine

## 2017-01-01 VITALS — BP 138/58 | HR 90 | Ht 66.0 in | Wt 204.0 lb

## 2017-01-01 DIAGNOSIS — R062 Wheezing: Secondary | ICD-10-CM | POA: Insufficient documentation

## 2017-01-01 DIAGNOSIS — J8410 Pulmonary fibrosis, unspecified: Secondary | ICD-10-CM | POA: Diagnosis not present

## 2017-01-01 MED ORDER — ACLIDINIUM BROMIDE 400 MCG/ACT IN AEPB: 1.0000 | INHALATION_SPRAY | Freq: Two times a day (BID) | RESPIRATORY_TRACT | 0 refills | Status: DC

## 2017-01-01 MED ORDER — PREDNISONE 10 MG PO TABS: ORAL_TABLET | ORAL | 0 refills | Status: DC

## 2017-01-01 NOTE — Progress Notes (Signed)
Subjective:     Patient ID: [name], male   DOB: 14-Nov-1945, 71 y.o.   MRN: [id number]  HPI   OV 01/01/2017  Chief Complaint  Patient presents with  . Follow-up    Pt here after PFT. Pt states overall his breathing is unchanged since last OV. Pt c/o occ dry cough. Pt deneis CP/tightness and f/c/s.    Follow-up interstitial lung disease UIP pattern associated with rheumatoid arthritis and immune suppression. Last visit in March 2017. He doesn't that overall he has been stable. He has a new rheumatologist but still at [facility] Dr [name]. His wife also says he is stable. However as he started talking to them his wife slowly started telling me that for the last few to several months he is wheezing more than usual and is a little more short of breath than usual. However he denied it. This is compliant with his medications. [name] not on any inhaler therapy. He uses oxygen only at night. Previous CT scans of the chest in September 2016 in March 2017 did not report any associated emphysema. His most recent pulmonary function test is shown that on Combivent. I personally visualized the graph: [name] Kitchen Forced vital capacity is stable with a diffusion capacity seems to decline over time. Walking desaturation test 185 feet 3 laps on room air: He desaturated to 84%. And then we walked him on 2 L oxygen he did not desaturate.    Results for JUANYA, VILLAVICENCIO (MRN [id number]) as of 01/01/2017 10:00  Ref. Range 01/23/2014 15:38 10/01/2014 11:24 07/29/2015 11:09 12/18/2016 09:34  FVC-Pre Latest Units: L 2.18 2.05 2.17 2.21  FVC-%Pred-Pre Latest Units: % 66 62 66 68   Results for CALLUM, WOLF (MRN [id number]) as of 01/01/2017 10:00  Ref. Range 01/23/2014 15:38 10/01/2014 11:24 07/29/2015 11:09 12/18/2016 09:34  DLCO unc Latest Units: ml/min/mmHg 17.93 16.86 16.72 13.81  DLCO unc % pred Latest Units: % 66 62 62 51   Results for KIMONI, PAGLIARULO (MRN [id number]) as of 01/01/2017 10:00  Ref. Range  12/15/2011 07:50 12/15/2011 18:55 12/16/2011 04:55  Hemoglobin Latest Ref Range: 13.0 - 17.0 g/dL 13.3 12.2 (L) 12.3 (L)  Results for PAITON, FOSCO (MRN [id number]) as of 01/01/2017 10:00  Ref. Range 12/15/2011 07:50 12/15/2011 10:20 12/15/2011 18:55 12/16/2011 04:55  Creatinine Latest Ref Range: 0.50 - 1.35 mg/dL 0.92  1.07 0.87    has a past medical history of Arthritis; Diabetes mellitus; Hypertension; Pneumonia; and Thyroid disease.   reports that he quit smoking about 32 years ago. His smoking use included Cigarettes. He has a 5.00 pack-year smoking history. He has never used smokeless tobacco.  Past Surgical History:  Procedure Laterality Date  . KNEE ARTHROSCOPY    . NO PAST SURGERIES      No Known Allergies  Immunization History  Administered Date(s) Administered  . Influenza Split 08/03/2011, 07/03/2012, 09/02/2013, 07/03/2014  . Influenza, High Dose Seasonal PF 08/02/2016  . Influenza,inj,Quad PF,36+ Mos 07/29/2015  . Pneumococcal Polysaccharide-23 11/03/2007    Family History  Problem Relation Age of Onset  . Diabetes Mother   . Stroke Mother      Current Outpatient Prescriptions:  .  albuterol (PROAIR HFA) 108 (90 BASE) MCG/ACT inhaler, Inhale 2 puffs into the lungs every 6 (six) hours as needed., Disp: 1 Inhaler, Rfl: 3 .  amLODipine (NORVASC) 10 MG tablet, Take 10 mg by mouth daily., Disp: , Rfl:  .  aspirin 81 MG tablet, Take 81 mg by mouth daily., Disp: ,  Rfl:  .  azaTHIOprine (IMURAN) 50 MG tablet, Take 2 in am and 1 in pm, Disp: , Rfl:  .  folic acid (FOLVITE) 591 MCG tablet, Take 400 mcg by mouth daily., Disp: , Rfl:  .  levothyroxine (SYNTHROID, LEVOTHROID) 25 MCG tablet, Take 25 mcg by mouth daily., Disp: , Rfl:  .  metFORMIN (GLUCOPHAGE-XR) 500 MG 24 hr tablet, Take 1 tablet by mouth 2 (two) times daily., Disp: , Rfl:  .  NOVOLIN N RELION 100 UNIT/ML injection, 22 Units. 20 units in morning,22 units in evening., Disp: , Rfl:  .  predniSONE (DELTASONE) 5 MG  tablet, Take 5 mg by mouth daily with breakfast., Disp: , Rfl:    Review of Systems     Objective:   Physical Exam  Constitutional: [name] oriented to person, place, and time. He appears well-developed and well-nourished. No distress.  HENT:  Head: Normocephalic and atraumatic.  Right Ear: External ear normal.  Left Ear: External ear normal.  Mouth/Throat: Oropharynx is clear and moist. No oropharyngeal exudate.  Eyes: Conjunctivae and EOM are normal. Pupils are equal, round, and reactive to light. Right eye exhibits no discharge. Left eye exhibits no discharge. No scleral icterus.  Neck: Normal range of motion. Neck supple. No JVD present. No tracheal deviation present. No thyromegaly present.  Cardiovascular: Normal rate, regular rhythm and intact distal pulses.  Exam reveals no gallop and no friction rub.   No murmur heard. Pulmonary/Chest: Effort normal. No respiratory distress. He has wheezes. He has rales. He exhibits no tenderness.  I think his crackles increased compared to baseline. In addition there might be some wheezing  Abdominal: Soft. Bowel sounds are normal. He exhibits no distension and no mass. There is no tenderness. There is no rebound and no guarding.  Musculoskeletal: Normal range of motion. He exhibits no edema or tenderness.  Lymphadenopathy:    He has no cervical adenopathy.  Neurological: [name] alert and oriented to person, place, and time. He has normal reflexes. No cranial nerve deficit. Coordination normal.  Skin: Skin is warm and dry. No rash noted. [name] not diaphoretic. No erythema. No pallor.  Psychiatric: He has a normal mood and affect. His behavior is normal. Judgment and thought content normal.  Nursing note and vitals reviewed.  Vitals:   01/01/17 0926  BP: (!) 138/58  Pulse: 90  SpO2: 97%  Weight: 204 lb (92.5 kg)  Height: _0  (1.676 m)   Estimated body mass index is 32.93 kg/m as calculated from the following:   Height as of this  encounter: _1  (1.676 m).   Weight as of this encounter: 204 lb (92.5 kg).      Assessment:       ICD-9-CM ICD-10-CM   1. 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) 515 J84.10   2. Wheezing on auscultation 786.07 R06.2        Plan:     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) - Likely has progressive disease. [name] now showing ambulate to desaturation. Therefore at least a moderate decline.  Plan  - Continue oxygen therapy at night - Start 2 L oxygen with exertion in the daytime  - Plan for high resolution CT chest in 3 months  - Might have evaluate for pulmonary hypertension at follow-up especially the CT chest does not show any worsening of interstitial lung disease  Wheezing on auscultation New finding Unclear why you are wheezing. I  thought you might have coexistent emphysema but this is not reported on the CT chest. Reported history of smoking is not significant.  Plan - Please take prednisone 40 mg x1 day, then 30 mg x1 day, then 20 mg x1 day, then 10 mg x1 day, and then 5 mg x1 day and stop - sample tudorza for 2 months; call with response   followup 3 months but aftrer HRCT done at followup  Dr. Brand Males, [name], [facility].C.P Pulmonary and Critical Care Medicine Staff Physician [place] [facility] Pager: [phone], If no answer or between  15:00h - 7:00h: call [phone]  01/01/2017 10:23 AM

## 2017-01-01 NOTE — Assessment & Plan Note (Addendum)
-  Likely has progressive disease. [name] now showing ambulate to desaturation. Therefore at least a moderate decline.  Plan  - Continue oxygen therapy at night - Start 2 L oxygen with exertion in the daytime  - Plan for high resolution CT chest in 3 months  - Might have evaluate for pulmonary hypertension at follow-up especially the CT chest does not show any worsening of interstitial lung disease

## 2017-01-01 NOTE — Assessment & Plan Note (Signed)
Unclear why you are wheezing. I thought you might have coexistent emphysema but this is not reported on the CT chest. Reported history of smoking is not significant.  Plan - Please take prednisone 40 mg x1 day, then 30 mg x1 day, then 20 mg x1 day, then 10 mg x1 day, and then 5 mg x1 day and stop - sample tudorza for 2 months; call with response

## 2017-01-01 NOTE — Patient Instructions (Signed)
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) - Likely has progressive disease. [name] now showing ambulate to desaturation. Therefore at least a moderate decline.  Plan  - Continue oxygen therapy at night - Start 2 L oxygen with exertion in the daytime  - Plan for high resolution CT chest in 3 months  - Might have evaluate for pulmonary hypertension at follow-up especially the CT chest does not show any worsening of interstitial lung disease  Wheezing on auscultation Unclear why you are wheezing. I thought you might have coexistent emphysema but this is not reported on the CT chest. Reported history of smoking is not significant.  Plan - Please take prednisone 40 mg x1 day, then 30 mg x1 day, then 20 mg x1 day, then 10 mg x1 day, and then 5 mg x1 day and stop - sample tudorza for 2 months; call with response  Followup - 62month but after HRCT chest done at followup

## 2017-01-26 DIAGNOSIS — M0589 Other rheumatoid arthritis with rheumatoid factor of multiple sites: Secondary | ICD-10-CM | POA: Diagnosis not present

## 2017-01-26 DIAGNOSIS — Z79899 Other long term (current) drug therapy: Secondary | ICD-10-CM | POA: Diagnosis not present

## 2017-01-26 DIAGNOSIS — M150 Primary generalized (osteo)arthritis: Secondary | ICD-10-CM | POA: Diagnosis not present

## 2017-01-26 DIAGNOSIS — J8410 Pulmonary fibrosis, unspecified: Secondary | ICD-10-CM | POA: Diagnosis not present

## 2017-01-31 DIAGNOSIS — R0902 Hypoxemia: Secondary | ICD-10-CM | POA: Diagnosis not present

## 2017-02-23 DIAGNOSIS — Z79899 Other long term (current) drug therapy: Secondary | ICD-10-CM | POA: Diagnosis not present

## 2017-02-23 DIAGNOSIS — M0589 Other rheumatoid arthritis with rheumatoid factor of multiple sites: Secondary | ICD-10-CM | POA: Diagnosis not present

## 2017-03-02 DIAGNOSIS — R0902 Hypoxemia: Secondary | ICD-10-CM | POA: Diagnosis not present

## 2017-04-02 DIAGNOSIS — R0902 Hypoxemia: Secondary | ICD-10-CM | POA: Diagnosis not present

## 2017-04-06 DIAGNOSIS — E782 Mixed hyperlipidemia: Secondary | ICD-10-CM | POA: Diagnosis not present

## 2017-04-06 DIAGNOSIS — E1065 Type 1 diabetes mellitus with hyperglycemia: Secondary | ICD-10-CM | POA: Diagnosis not present

## 2017-04-09 ENCOUNTER — Ambulatory Visit (HOSPITAL_COMMUNITY)
Admission: RE | Admit: 2017-04-09 | Discharge: 2017-04-09 | Disposition: A | Discharge status: Home / Self Care | Payer: Medicare Other | Source: Ambulatory Visit | Attending: Internal Medicine | Admitting: Internal Medicine

## 2017-04-09 DIAGNOSIS — R590 Localized enlarged lymph nodes: Secondary | ICD-10-CM | POA: Diagnosis not present

## 2017-04-09 DIAGNOSIS — J84112 Idiopathic pulmonary fibrosis: Secondary | ICD-10-CM | POA: Diagnosis not present

## 2017-04-09 DIAGNOSIS — J8410 Pulmonary fibrosis, unspecified: Secondary | ICD-10-CM | POA: Diagnosis not present

## 2017-04-09 DIAGNOSIS — R062 Wheezing: Secondary | ICD-10-CM | POA: Insufficient documentation

## 2017-04-09 DIAGNOSIS — I2510 Atherosclerotic heart disease of native coronary artery without angina pectoris: Secondary | ICD-10-CM | POA: Insufficient documentation

## 2017-04-09 DIAGNOSIS — I700 Atherosclerosis of aorta: Secondary | ICD-10-CM | POA: Diagnosis not present

## 2017-04-13 ENCOUNTER — Telehealth: Payer: Self-pay | Admitting: Internal Medicine

## 2017-04-13 DIAGNOSIS — K219 Gastro-esophageal reflux disease without esophagitis: Secondary | ICD-10-CM | POA: Diagnosis not present

## 2017-04-13 DIAGNOSIS — E782 Mixed hyperlipidemia: Secondary | ICD-10-CM | POA: Diagnosis not present

## 2017-04-13 DIAGNOSIS — E1065 Type 1 diabetes mellitus with hyperglycemia: Secondary | ICD-10-CM | POA: Diagnosis not present

## 2017-04-13 NOTE — Telephone Encounter (Signed)
Spoke with pt and informed him of his CT results per MR. Pt understood and had no further questions at this time.   Notes recorded by Brand Males, MD on 04/12/2017 at 1:04 PM EDT ILD is worse. Will discuss 06/23/17 fu

## 2017-04-15 ENCOUNTER — Ambulatory Visit: Payer: Medicare Other | Admitting: Acute Care

## 2017-05-02 DIAGNOSIS — R0902 Hypoxemia: Secondary | ICD-10-CM | POA: Diagnosis not present

## 2017-05-14 ENCOUNTER — Ambulatory Visit: Payer: Medicare Other | Admitting: Internal Medicine

## 2017-05-18 DIAGNOSIS — J8410 Pulmonary fibrosis, unspecified: Secondary | ICD-10-CM | POA: Diagnosis not present

## 2017-05-18 DIAGNOSIS — M0589 Other rheumatoid arthritis with rheumatoid factor of multiple sites: Secondary | ICD-10-CM | POA: Diagnosis not present

## 2017-05-18 DIAGNOSIS — Z79899 Other long term (current) drug therapy: Secondary | ICD-10-CM | POA: Diagnosis not present

## 2017-05-18 DIAGNOSIS — M150 Primary generalized (osteo)arthritis: Secondary | ICD-10-CM | POA: Diagnosis not present

## 2017-06-02 DIAGNOSIS — R0902 Hypoxemia: Secondary | ICD-10-CM | POA: Diagnosis not present

## 2017-06-23 ENCOUNTER — Encounter: Payer: Self-pay | Admitting: Internal Medicine

## 2017-06-23 ENCOUNTER — Ambulatory Visit (INDEPENDENT_AMBULATORY_CARE_PROVIDER_SITE_OTHER): Payer: Medicare Other | Admitting: Internal Medicine

## 2017-06-23 VITALS — BP 130/70 | HR 86 | Ht 66.0 in | Wt 196.2 lb

## 2017-06-23 DIAGNOSIS — J8410 Pulmonary fibrosis, unspecified: Secondary | ICD-10-CM | POA: Diagnosis not present

## 2017-06-23 NOTE — Patient Instructions (Signed)
ICD-10-CM   1. 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) J84.10     Pulmonary fibrosis shows continued decline based on CT chest since 2017 but I think you are stable since August 2018  Wheezing is improved on tudorza  I think you need cellcept or higher dose immuran to prevent progression of your fibrosis  - [place] nurse will reach out to Dr Kathlene November at [facility] asap so I can discuss  We do not have high dose flu shot 06/23/2017; ensure you get it sooner  Please talk to PCP Merrilee Seashore, MD -  and ensure you discuss safety of new  shingarix vaccine  Followup  in 3 months do Pre-bd spiro and dlco only. No lung volume or bd response. No post-bd spiro ROV with Dr Chase Caller in 3 months or sooner if needed

## 2017-06-23 NOTE — Addendum Note (Signed)
Addended by: [name] Salina on: 06/23/2017 09:57 AM   Modules accepted: Orders

## 2017-06-23 NOTE — Progress Notes (Signed)
Subjective:     Patient ID: [name], male   DOB: 03/05/1946, 71 y.o.   MRN: [id number]  HPI   OV 01/01/2017  Chief Complaint  Patient presents with  . Follow-up    Pt here after PFT. Pt states overall his breathing is unchanged since last OV. Pt c/o occ dry cough. Pt deneis CP/tightness and f/c/s.    Follow-up interstitial lung disease UIP pattern associated with rheumatoid arthritis and immune suppression. Last visit in March 2017. He doesn't that overall he has been stable. He has a new rheumatologist but still at [facility] Dr [name]. His wife also says he is stable. However as he started talking to them his wife slowly started telling me that for the last few to several months he is wheezing more than usual and is a little more short of breath than usual. However he denied it. This is compliant with his medications. [name] not on any inhaler therapy. He uses oxygen only at night. Previous CT scans of the chest in September 2016 in March 2017 did not report any associated emphysema. His most recent pulmonary function test is shown that on Combivent. I personally visualized the graph: [name] Kitchen Forced vital capacity is stable with a diffusion capacity seems to decline over time. Walking desaturation test 185 feet 3 laps on room air: He desaturated to 84%. And then we walked him on 2 L oxygen he did not desaturate.   OV 06/23/2017  Chief Complaint  Patient presents with  . Follow-up    pt here after CT in 04/2017. Pt states his breathing is unchanged since last OV. Pt c/o interemittent dry cough. Pt denies CP/tightness and f/c/s.      Follow-up interstitial lung disease UIP pattern associated with rheumatoid arthritis and on Imuran. Last visit in March 2018 I was concerned about progressive interstitial lung disease because he started desaturating on room air and his FVC show decline. Currently rated a high-resolution CT chest that confirmed progressive lung disease. We  walked him 185 feet 3 laps on room air and he desaturated at third lap to 89%. This is very similar to previous visit suggesting that he has not decline since March 2018 but that his decline has been more gradual over the last 1 year. Compared to last visit he feels stable. His wife feels the same. [name] aware of his worst only fibrosis and the potential need to change anti fibrotics  Results for Steven, [name] (MRN [id number]) as of 06/23/2017 09:23  Ref. Range 01/23/2014 15:38 10/01/2014 11:24 07/29/2015 11:09 12/18/2016 09:34  FVC-Pre Latest Units: L 2.18 2.05 2.17 2.21  FVC-%Pred-Pre Latest Units: % 66 62 66 68  Results for [name] (MRN [id number]) as of 06/23/2017 09:23  Ref. Range 01/23/2014 15:38 10/01/2014 11:24 07/29/2015 11:09 12/18/2016 09:34  DLCO unc Latest Units: ml/min/mmHg 17.93 16.86 16.72 13.81  DLCO unc % pred Latest Units: % 66 62 62 51    IMPRESSION: 1. Continued interval progression of fibrotic interstitial lung disease with extensive honeycombing, asymmetrically involving the right lung, with mild basilar predominance, diagnostic of usual interstitial pneumonia (UIP) . 2. Stable mild reactive mediastinal lymphadenopathy. 3. Aortic atherosclerosis.  One vessel coronary atherosclerosis.   Electronically Signed   By: Ilona Sorrel [name]   On: 04/09/2017 10:51     has a past medical history of Arthritis; Diabetes mellitus; Hypertension; Pneumonia; and Thyroid disease.   reports that he quit smoking about 32 years ago. His smoking use included  Cigarettes. He has a 5.00 pack-year smoking history. He has never used smokeless tobacco.  Past Surgical History:  Procedure Laterality Date  . KNEE ARTHROSCOPY    . NO PAST SURGERIES      No Known Allergies  Immunization History  Administered Date(s) Administered  . Influenza Split 08/03/2011, 07/03/2012, 09/02/2013, 07/03/2014  . Influenza, High Dose Seasonal PF 08/02/2016  . Influenza,inj,Quad PF,6+ Mos 07/29/2015  .  Pneumococcal Polysaccharide-23 11/03/2007    Family History  Problem Relation Age of Onset  . Diabetes Mother   . Stroke Mother      Current Outpatient Prescriptions:  .  Aclidinium Bromide ([name] PRESSAIR) 400 MCG/ACT AEPB, Inhale 1 puff into the lungs 2 (two) times daily., Disp: 30 each, Rfl: 0 .  albuterol (PROAIR HFA) 108 (90 BASE) MCG/ACT inhaler, Inhale 2 puffs into the lungs every 6 (six) hours as needed., Disp: 1 Inhaler, Rfl: 3 .  amLODipine (NORVASC) 10 MG tablet, Take 10 mg by mouth daily., Disp: , Rfl:  .  aspirin 81 MG tablet, Take 81 mg by mouth daily., Disp: , Rfl:  .  azaTHIOprine (IMURAN) 50 MG tablet, Take 2 in am and 1 in pm, Disp: , Rfl:  .  folic acid (FOLVITE) 170 MCG tablet, Take 400 mcg by mouth daily., Disp: , Rfl:  .  levothyroxine (SYNTHROID, LEVOTHROID) 25 MCG tablet, Take 25 mcg by mouth daily., Disp: , Rfl:  .  metFORMIN (GLUCOPHAGE-XR) 500 MG 24 hr tablet, Take 1 tablet by mouth 2 (two) times daily., Disp: , Rfl:  .  NOVOLIN N RELION 100 UNIT/ML injection, 22 Units. 20 units in morning,22 units in evening., Disp: , Rfl:  .  predniSONE (DELTASONE) 5 MG tablet, Take 5 mg by mouth daily with breakfast., Disp: , Rfl:    Review of Systems     Objective:   Physical Exam  Vitals:   06/23/17 0911  BP: 130/70  Pulse: 86  SpO2: 94%  Weight: 196 lb 3.2 oz (89 kg)  Height: _0  (1.676 m)    Estimated body mass index is 31.67 kg/m as calculated from the following:   Height as of this encounter: _1  (1.676 m).   Weight as of this encounter: 196 lb 3.2 oz (89 kg).     Assessment:       ICD-10-CM   1. 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) J84.10        Plan:      Pulmonary fibrosis shows continued decline based on CT chest since 2017 but I think you are stable since August 2018  Wheezing is improved on tudorza  I think you need cellcept or higher dose immuran to prevent progression of your fibrosis  - [place] nurse will reach out  to Dr Kathlene November at [facility] asap so I can discuss  We do not have high dose flu shot 06/23/2017; ensure you get it sooner  Please talk to PCP Merrilee Seashore, MD -  and ensure you discuss safety of new  shingarix vaccine  Followup  in 3 months do Pre-bd spiro and dlco only. No lung volume or bd response. No post-bd spiro ROV with Dr Chase Caller in 3 months or sooner if needed    > 50% of this > 25 min visit spent in face to face counseling or coordination of care    Dr. Brand Males, [name], [facility].C.P Pulmonary and Critical Care Medicine Staff Physician [place] [facility] Pager: [phone]  [phone], If no answer or between  15:00h - 7:00h: call [phone]  06/23/2017 9:37 AM

## 2017-07-03 DIAGNOSIS — R0902 Hypoxemia: Secondary | ICD-10-CM | POA: Diagnosis not present

## 2017-07-29 DIAGNOSIS — H25013 Cortical age-related cataract, bilateral: Secondary | ICD-10-CM | POA: Diagnosis not present

## 2017-07-29 DIAGNOSIS — H524 Presbyopia: Secondary | ICD-10-CM | POA: Diagnosis not present

## 2017-07-29 DIAGNOSIS — H2513 Age-related nuclear cataract, bilateral: Secondary | ICD-10-CM | POA: Diagnosis not present

## 2017-07-29 DIAGNOSIS — E119 Type 2 diabetes mellitus without complications: Secondary | ICD-10-CM | POA: Diagnosis not present

## 2017-08-02 DIAGNOSIS — R0902 Hypoxemia: Secondary | ICD-10-CM | POA: Diagnosis not present

## 2017-08-11 DIAGNOSIS — Z23 Encounter for immunization: Secondary | ICD-10-CM | POA: Diagnosis not present

## 2017-08-17 DIAGNOSIS — Z79899 Other long term (current) drug therapy: Secondary | ICD-10-CM | POA: Diagnosis not present

## 2017-08-17 DIAGNOSIS — J8410 Pulmonary fibrosis, unspecified: Secondary | ICD-10-CM | POA: Diagnosis not present

## 2017-08-17 DIAGNOSIS — M150 Primary generalized (osteo)arthritis: Secondary | ICD-10-CM | POA: Diagnosis not present

## 2017-08-17 DIAGNOSIS — Z13820 Encounter for screening for osteoporosis: Secondary | ICD-10-CM | POA: Diagnosis not present

## 2017-08-17 DIAGNOSIS — M0589 Other rheumatoid arthritis with rheumatoid factor of multiple sites: Secondary | ICD-10-CM | POA: Diagnosis not present

## 2017-08-26 DIAGNOSIS — M150 Primary generalized (osteo)arthritis: Secondary | ICD-10-CM | POA: Diagnosis not present

## 2017-09-02 DIAGNOSIS — R0902 Hypoxemia: Secondary | ICD-10-CM | POA: Diagnosis not present

## 2017-09-02 DIAGNOSIS — M0589 Other rheumatoid arthritis with rheumatoid factor of multiple sites: Secondary | ICD-10-CM | POA: Diagnosis not present

## 2017-09-16 DIAGNOSIS — M0589 Other rheumatoid arthritis with rheumatoid factor of multiple sites: Secondary | ICD-10-CM | POA: Diagnosis not present

## 2017-09-16 DIAGNOSIS — E1065 Type 1 diabetes mellitus with hyperglycemia: Secondary | ICD-10-CM | POA: Diagnosis not present

## 2017-09-22 DIAGNOSIS — E1065 Type 1 diabetes mellitus with hyperglycemia: Secondary | ICD-10-CM | POA: Diagnosis not present

## 2017-09-22 DIAGNOSIS — E782 Mixed hyperlipidemia: Secondary | ICD-10-CM | POA: Diagnosis not present

## 2017-09-22 DIAGNOSIS — I10 Essential (primary) hypertension: Secondary | ICD-10-CM | POA: Diagnosis not present

## 2017-09-27 ENCOUNTER — Ambulatory Visit (INDEPENDENT_AMBULATORY_CARE_PROVIDER_SITE_OTHER): Payer: Medicare Other | Admitting: Internal Medicine

## 2017-09-27 ENCOUNTER — Encounter: Payer: Self-pay | Admitting: Internal Medicine

## 2017-09-27 ENCOUNTER — Ambulatory Visit: Payer: Medicare Other | Admitting: Internal Medicine

## 2017-09-27 VITALS — BP 140/70 | HR 96 | Ht 66.0 in | Wt 196.0 lb

## 2017-09-27 DIAGNOSIS — J8410 Pulmonary fibrosis, unspecified: Secondary | ICD-10-CM

## 2017-09-27 LAB — PULMONARY FUNCTION TEST
DL/VA % pred: 78 %
DL/VA: 3.41 ml/min/mmHg/L
DLCO cor % pred: 48 %
DLCO cor: 13.07 ml/min/mmHg
DLCO unc % pred: 45 %
DLCO unc: 12.14 ml/min/mmHg
FEF 25-75 Pre: 2.43 L/sec
FEF2575-%Pred-Pre: 120 %
FEV1-%Pred-Pre: 75 %
FEV1-Pre: 1.81 L
FEV1FVC-%Pred-Pre: 115 %
FEV6-%Pred-Pre: 68 %
FEV6-Pre: 2.06 L
FEV6FVC-%Pred-Pre: 106 %
FVC-%Pred-Pre: 64 %
FVC-Pre: 2.06 L
Pre FEV1/FVC ratio: 88 %
Pre FEV6/FVC Ratio: 100 %

## 2017-09-27 NOTE — Progress Notes (Signed)
Subjective:     Patient ID: [name], male   DOB: 05-04-1946, 71 y.o.   MRN: [id number]  HPI   OV 01/01/2017  Chief Complaint  Patient presents with  . Follow-up    Pt here after PFT. Pt states overall his breathing is unchanged since last OV. Pt c/o occ dry cough. Pt deneis CP/tightness and f/c/s.    Follow-up interstitial lung disease UIP pattern associated with rheumatoid arthritis and immune suppression. Last visit in March 2017. He doesn't that overall he has been stable. He has a new rheumatologist but still at [facility] Dr [name]. His wife also says he is stable. However as he started talking to them his wife slowly started telling me that for the last few to several months he is wheezing more than usual and is a little more short of breath than usual. However he denied it. This is compliant with his medications. [name] not on any inhaler therapy. He uses oxygen only at night. Previous CT scans of the chest in September 2016 in March 2017 did not report any associated emphysema. His most recent pulmonary function test is shown that on Combivent. I personally visualized the graph: [name] Kitchen Forced vital capacity is stable with a diffusion capacity seems to decline over time. Walking desaturation test 185 feet 3 laps on room air: He desaturated to 84%. And then we walked him on 2 L oxygen he did not desaturate.   OV 06/23/2017  Chief Complaint  Patient presents with  . Follow-up    pt here after CT in 04/2017. Pt states his breathing is unchanged since last OV. Pt c/o interemittent dry cough. Pt denies CP/tightness and f/c/s.      Follow-up interstitial lung disease UIP pattern associated with rheumatoid arthritis and on Imuran. Last visit in March 2018 I was concerned about progressive interstitial lung disease because he started desaturating on room air and his FVC show decline. Currently rated a high-resolution CT chest that confirmed progressive lung disease. We  walked him 185 feet 3 laps on room air and he desaturated at third lap to 89%. This is very similar to previous visit suggesting that  that his decline has been more gradual over the last 1 year. Compared to last visit he feels stable. His wife feels the same. [name] aware of his worsening fibrosis and the potential need to change anti fibrotics  OV 09/27/2017  Chief Complaint  Patient presents with  . Follow-up    PFT done today. Pt states that he thinks his breathing has become worse. Has complaints of SOB with exertion, occ. dry cough. Denies any CP. States that he does wear O2 at night, DME: [facility].     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  Patient is on Imuran.  [name] having progressive disease this year 2018.  He told my certified medical assistant that he is having progressive shortness of breath but to me he tells me that he is stable.  As always he is a very poor historian.  Wife gives a better history.  He has oxygen at home for the night which she uses but does not use his exertional small portable system even though this helps him.  They do admit that dyspnea is progressive.  In fact pulmonary function test today shows progressive disease.  He also seem to desaturate a little bit more easily.  He continues to be on Imuran.  In talking to him it  appears his rheumatologist has now started him on Rituxan.  He has had 2 doses 2 weeks apart with the most recent one being mid November 2018.  The next dose is at mid May 2019 which is 41-monthinterval.  He says he is up-to-date with his flu shot but is not had a conversation with his primary care physician about the new shingles vaccine that is inactivated vaccine.  [name] not having much of joint issues.  [name] aware that the Rituxan is specifically designed to slow down the progression of pulmonary fibrosis.  Walking desaturation test 185 feet x3 laps on room air with a forehead probe.  Resting heart  rate was 95/min.  Final heart rate 108/min.  Resting pulse ox was 95%.  By the time he finished his second lap he desaturated to 84%.  He was then placed on 2 L nasal cannula and he was able to maintain his saturation to 95%.  Results for [name](MRN [id number] as of 09/27/2017 10:06  Ref. Range 01/23/2014 15:38 10/01/2014 11:24 07/29/2015 11:09 12/18/2016 09:34 09/27/2017 08:44  FVC-Pre Latest Units: L 2.18 2.05 2.17 2.21 2.06  FVC-%Pred-Pre Latest Units: % 66 62 66 68 64  Results for [name](MRN [id number] as of 09/27/2017 10:06  Ref. Range 01/23/2014 15:38 10/01/2014 11:24 07/29/2015 11:09 12/18/2016 09:34 09/27/2017 08:44  DLCO unc Latest Units: ml/min/mmHg 17.93 16.86 16.72 13.81 12.14  DLCO unc % pred Latest Units: % 66 62 62 51 45    IMPRESSION: June 2018 1. Continued interval progression of fibrotic interstitial lung disease with extensive honeycombing, asymmetrically involving the right lung, with mild basilar predominance, diagnostic of usual interstitial pneumonia (UIP) . 2. Stable mild reactive mediastinal lymphadenopathy. 3. Aortic atherosclerosis.  One vessel coronary atherosclerosis.   Electronically Signed   By: Steven SorrelM.D.   On: 04/09/2017 10:51     has a past medical history of Arthritis, Diabetes mellitus, Hypertension, Pneumonia, and Thyroid disease.   reports that he quit smoking about 32 years ago. His smoking use included cigarettes. He has a 5.00 pack-year smoking history. he has never used smokeless tobacco.  Past Surgical History:  Procedure Laterality Date  . KNEE ARTHROSCOPY    . NO PAST SURGERIES      No Known Allergies  Immunization History  Administered Date(s) Administered  . Influenza Split 08/03/2011, 07/03/2012, 09/02/2013, 07/03/2014  . Influenza, High Dose Seasonal PF 08/02/2016, 08/02/2017  . Influenza,inj,Quad PF,6+ Mos 07/29/2015  . Pneumococcal Polysaccharide-23 11/03/2007    Family History  Problem Relation Age  of Onset  . Diabetes Mother   . Stroke Mother      Current Outpatient Medications:  .  Aclidinium Bromide ([name] PRESSAIR) 400 MCG/ACT AEPB, Inhale 1 puff into the lungs 2 (two) times daily., Disp: 30 each, Rfl: 0 .  amLODipine (NORVASC) 10 MG tablet, Take 10 mg by mouth daily., Disp: , Rfl:  .  aspirin 81 MG tablet, Take 81 mg by mouth daily., Disp: , Rfl:  .  azaTHIOprine (IMURAN) 50 MG tablet, Take 2 in am and 1 in pm, Disp: , Rfl:  .  folic acid (FOLVITE) 8947MCG tablet, Take 400 mcg by mouth daily., Disp: , Rfl:  .  levothyroxine (SYNTHROID, LEVOTHROID) 25 MCG tablet, Take 25 mcg by mouth daily., Disp: , Rfl:  .  metFORMIN (GLUCOPHAGE-XR) 500 MG 24 hr tablet, Take 1 tablet by mouth 2 (two) times daily., Disp: , Rfl:  .  NOVOLIN N RELION 100 UNIT/ML injection,  22 Units. 20 units in morning,22 units in evening., Disp: , Rfl:  .  predniSONE (DELTASONE) 5 MG tablet, Take 5 mg by mouth daily with breakfast., Disp: , Rfl:      Review of Systems     Objective:   Physical Exam  Constitutional: [name] oriented to person, place, and time. He appears well-developed and well-nourished. No distress.  HENT:  Head: Normocephalic and atraumatic.  Right Ear: External ear normal.  Left Ear: External ear normal.  Mouth/Throat: Oropharynx is clear and moist. No oropharyngeal exudate.  Eyes: Conjunctivae and EOM are normal. Pupils are equal, round, and reactive to light. Right eye exhibits no discharge. Left eye exhibits no discharge. No scleral icterus.  Neck: Normal range of motion. Neck supple. No JVD present. No tracheal deviation present. No thyromegaly present.  Cardiovascular: Normal rate, regular rhythm and intact distal pulses. Exam reveals no gallop and no friction rub.  No murmur heard. Pulmonary/Chest: Effort normal. No respiratory distress. He has no wheezes. He has rales. He exhibits no tenderness.  Abdominal: Soft. Bowel sounds are normal. He exhibits no distension and no mass.  There is no tenderness. There is no rebound and no guarding.  Musculoskeletal: Normal range of motion. He exhibits no edema or tenderness.  Lymphadenopathy:    He has no cervical adenopathy.  Neurological: [name] alert and oriented to person, place, and time. He has normal reflexes. No cranial nerve deficit. Coordination normal.  Skin: Skin is warm and dry. No rash noted. [name] not diaphoretic. No erythema. No pallor.  Psychiatric: He has a normal mood and affect. His behavior is normal. Judgment and thought content normal.  Nursing note and vitals reviewed.  Vitals:   09/27/17 0946  BP: 140/70  Pulse: 96  SpO2: 97%  Weight: 196 lb (88.9 kg)  Height: _0  (1.676 m)   Estimated body mass index is 31.64 kg/m as calculated from the following:   Height as of this encounter: _1  (1.676 m).   Weight as of this encounter: 196 lb (88.9 kg).     Assessment:       ICD-10-CM   1. 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) J84.10        Plan:      Progressive disease Hopefully Rituxan works (last dose < 2 weeks ago)  [place] and rituxam per rheumatology MD  - next rituxan at 6 months in mid-may 2019 Use o2 with exertion and at night; will help In 3 months do Pre-bd spiro and dlco only. No lung volume or bd response. No post-bd spiro Please discuss singhles vaccine (new inactivated vaccine) with PCP Steven Seashore, MD  Followup 3 months or sooner if needed with pft     Dr. Brand Males, [name], [facility].C.P Pulmonary and Critical Care Medicine Staff Physician, [place] Director - Interstitial Lung Disease  Pulmonary [place] at [facility], [place], [address]  Pager: [phone], If no answer or between  15:00h - 7:00h: call [phone] Telephone: [phone]

## 2017-09-27 NOTE — Addendum Note (Signed)
Addended by: Lorretta Harp on: 09/27/2017 02:15 PM   Modules accepted: Orders

## 2017-09-27 NOTE — Progress Notes (Signed)
PFT done today.

## 2017-09-27 NOTE — Patient Instructions (Signed)
ICD-10-CM   1. 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) J84.10     Progressive disease Hopefully Rituxan works (last dose < 2 weeks ago)  [place] and rituxam per rheumatology MD  - next rituxan at 6 months in mid-may 2019 Use o2 with exertion and at night; will help In 3 months do Pre-bd spiro and dlco only. No lung volume or bd response. No post-bd spiro Please discuss singhles vaccine (new inactivated vaccine) with PCP Merrilee Seashore, MD  Followup 3 months or sooner if needed

## 2017-09-28 DIAGNOSIS — J8410 Pulmonary fibrosis, unspecified: Secondary | ICD-10-CM | POA: Diagnosis not present

## 2017-09-28 DIAGNOSIS — M150 Primary generalized (osteo)arthritis: Secondary | ICD-10-CM | POA: Diagnosis not present

## 2017-09-28 DIAGNOSIS — M0589 Other rheumatoid arthritis with rheumatoid factor of multiple sites: Secondary | ICD-10-CM | POA: Diagnosis not present

## 2017-09-28 DIAGNOSIS — Z79899 Other long term (current) drug therapy: Secondary | ICD-10-CM | POA: Diagnosis not present

## 2017-09-28 DIAGNOSIS — Z13820 Encounter for screening for osteoporosis: Secondary | ICD-10-CM | POA: Diagnosis not present

## 2017-09-28 NOTE — Addendum Note (Signed)
Addended by: Lorretta Harp on: 09/28/2017 10:55 AM   Modules accepted: Orders

## 2017-10-02 DIAGNOSIS — R0902 Hypoxemia: Secondary | ICD-10-CM | POA: Diagnosis not present

## 2017-10-04 ENCOUNTER — Telehealth: Payer: Self-pay | Admitting: Internal Medicine

## 2017-10-04 NOTE — Telephone Encounter (Signed)
Spoke with pt's wife and advised samples are ready to be picked up. She will let pt know. Nothing further is needed.

## 2017-11-02 DIAGNOSIS — R0902 Hypoxemia: Secondary | ICD-10-CM | POA: Diagnosis not present

## 2017-11-15 ENCOUNTER — Other Ambulatory Visit: Payer: Self-pay | Admitting: Internal Medicine

## 2017-12-03 DIAGNOSIS — R0902 Hypoxemia: Secondary | ICD-10-CM | POA: Diagnosis not present

## 2017-12-20 ENCOUNTER — Ambulatory Visit (INDEPENDENT_AMBULATORY_CARE_PROVIDER_SITE_OTHER): Payer: Medicare Other | Admitting: Internal Medicine

## 2017-12-20 ENCOUNTER — Encounter: Payer: Self-pay | Admitting: Internal Medicine

## 2017-12-20 ENCOUNTER — Ambulatory Visit: Payer: Medicare Other | Admitting: Internal Medicine

## 2017-12-20 VITALS — BP 130/62 | HR 85 | Ht 66.0 in | Wt 196.0 lb

## 2017-12-20 DIAGNOSIS — J8410 Pulmonary fibrosis, unspecified: Secondary | ICD-10-CM

## 2017-12-20 LAB — PULMONARY FUNCTION TEST
DL/VA % pred: 80 %
DL/VA: 3.47 ml/min/mmHg/L
DLCO unc % pred: 45 %
DLCO unc: 12.3 ml/min/mmHg
FEF 25-75 Pre: 2.88 L/sec
FEF2575-%Pred-Pre: 143 %
FEV1-%Pred-Pre: 77 %
FEV1-Pre: 1.84 L
FEV1FVC-%Pred-Pre: 115 %
FEV6-%Pred-Pre: 69 %
FEV6-Pre: 2.1 L
FEV6FVC-%Pred-Pre: 105 %
FVC-%Pred-Pre: 65 %
FVC-Pre: 2.1 L
Pre FEV1/FVC ratio: 88 %
Pre FEV6/FVC Ratio: 100 %

## 2017-12-20 NOTE — Progress Notes (Signed)
Subjective:     Patient ID: [name], male   DOB: 29-Aug-1946, 72 y.o.   MRN: [id number]  HPI   OV 01/01/2017  Chief Complaint  Patient presents with  . Follow-up    Pt here after PFT. Pt states overall his breathing is unchanged since last OV. Pt c/o occ dry cough. Pt deneis CP/tightness and f/c/s.    Follow-up interstitial lung disease UIP pattern associated with rheumatoid arthritis and immune suppression. Last visit in March 2017. He doesn't that overall he has been stable. He has a new rheumatologist but still at [facility] Dr [name]. His wife also says he is stable. However as he started talking to them his wife slowly started telling me that for the last few to several months he is wheezing more than usual and is a little more short of breath than usual. However he denied it. This is compliant with his medications. [name] not on any inhaler therapy. He uses oxygen only at night. Previous CT scans of the chest in September 2016 in March 2017 did not report any associated emphysema. His most recent pulmonary function test is shown that on Combivent. I personally visualized the graph: [name] Kitchen Forced vital capacity is stable with a diffusion capacity seems to decline over time. Walking desaturation test 185 feet 3 laps on room air: He desaturated to 84%. And then we walked him on 2 L oxygen he did not desaturate.   OV 06/23/2017  Chief Complaint  Patient presents with  . Follow-up    pt here after CT in 04/2017. Pt states his breathing is unchanged since last OV. Pt c/o interemittent dry cough. Pt denies CP/tightness and f/c/s.      Follow-up interstitial lung disease UIP pattern associated with rheumatoid arthritis and on Imuran. Last visit in March 2018 I was concerned about progressive interstitial lung disease because he started desaturating on room air and his FVC show decline. Currently rated a high-resolution CT chest that confirmed progressive lung disease. We  walked him 185 feet 3 laps on room air and he desaturated at third lap to 89%. This is very similar to previous visit suggesting that  that his decline has been more gradual over the last 1 year. Compared to last visit he feels stable. His wife feels the same. [name] aware of his worsening fibrosis and the potential need to change anti fibrotics  OV 09/27/2017  Chief Complaint  Patient presents with  . Follow-up    PFT done today. Pt states that he thinks his breathing has become worse. Has complaints of SOB with exertion, occ. dry cough. Denies any CP. States that he does wear O2 at night, DME: [facility].     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  Patient is on Imuran.  [name] having progressive disease this year 2018.  He told my certified medical assistant that he is having progressive shortness of breath but to me he tells me that he is stable.  As always he is a very poor historian.  Wife gives a better history.  He has oxygen at home for the night which she uses but does not use his exertional small portable system even though this helps him.  They do admit that dyspnea is progressive.  In fact pulmonary function test today shows progressive disease.  He also seem to desaturate a little bit more easily.  He continues to be on Imuran.  In talking to him it  appears his rheumatologist has now started him on Rituxan.  He has had 2 doses 2 weeks apart with the most recent one being mid November 2018.  The next dose is at mid May 2019 which is 46-monthinterval.  He says he is up-to-date with his flu shot but is not had a conversation with his primary care physician about the new shingles vaccine that is inactivated vaccine.  [name] not having much of joint issues.  [name] aware that the Rituxan is specifically designed to slow down the progression of pulmonary fibrosis.  Walking desaturation test 185 feet x3 laps on room air with a forehead probe.  Resting heart  rate was 95/min.  Final heart rate 108/min.  Resting pulse ox was 95%.  By the time he finished his second lap he desaturated to 84%.  He was then placed on 2 L nasal cannula and he was able to maintain his saturation to 95%.   OV 12/20/2017  Chief Complaint  Patient presents with  . Follow-up    follow up PFT,SOB with activity/exertion   [name] is here for follow-up because of his progressive UIP pattern pulmonary fibrosis related to rheumatoid arthritis.  At his last visit he was started on Rituxan by rheumatology.  Since then we started him on portable oxygen but he says he is not using it because he does not like to use it.  He does use oxygen at night which is helping.  His next dose of Rituxan is in May 2019.  Both he and his wife attest that since the start of Rituxan his symptoms are stable particularly shortness of breath is not any worse.  In fact pulmonary function test today shows continued ongoing stability with FVC and DLCO would suggest that the Rituxan might be helping him.  There are no new issues.  [name] here to have a shingles vaccine.  I told him about this last visit to talk to primary care physician but he does not remember this.   Results for [name], [name](MRN [id number] as of 09/27/2017 10:06  Ref. Range 01/23/2014 15:38 10/01/2014 11:24 07/29/2015 11:09 12/18/2016 09:34 09/27/2017 08:44 12/20/2017   FVC-Pre Latest Units: L 2.18 2.05 2.17 2.21 2.06 2.1  FVC-%Pred-Pre Latest Units: % 66 62 66 68 64 65%  Results for [name], [name](MRN [id number] as of 09/27/2017 10:06  Ref. Range 01/23/2014 15:38 10/01/2014 11:24 07/29/2015 11:09 12/18/2016 09:34 09/27/2017 08:44 12/20/2017   DLCO unc Latest Units: ml/min/mmHg 17.93 16.86 16.72 13.81 12.14 12.3  DLCO unc % pred Latest Units: % 66 62 62 51 45 68%    IMPRESSION: June 2018 1. Continued interval progression of fibrotic interstitial lung disease with extensive honeycombing, asymmetrically involving the right lung, with mild  basilar predominance, diagnostic of usual interstitial pneumonia (UIP) . 2. Stable mild reactive mediastinal lymphadenopathy. 3. Aortic atherosclerosis.  One vessel coronary atherosclerosis.   Electronically Signed   By: [name]   On: 04/09/2017 10:51      has a past medical history of Arthritis, Diabetes mellitus, Hypertension, Pneumonia, and Thyroid disease.   reports that he quit smoking about 33 years ago. His smoking use included cigarettes. He has a 5.00 pack-year smoking history. he has never used smokeless tobacco.  Past Surgical History:  Procedure Laterality Date  . KNEE ARTHROSCOPY    . NO PAST SURGERIES      No Known Allergies  Immunization History  Administered Date(s) Administered  . Influenza Split 08/03/2011, 07/03/2012,  09/02/2013, 07/03/2014  . Influenza, High Dose Seasonal PF 08/02/2016, 08/02/2017  . Influenza,inj,Quad PF,6+ Mos 07/29/2015  . Pneumococcal Polysaccharide-23 11/03/2007    Family History  Problem Relation Age of Onset  . Diabetes Mother   . Stroke Mother      Current Outpatient Medications:  .  Aclidinium Bromide ([name] PRESSAIR) 400 MCG/ACT AEPB, Inhale 1 puff into the lungs 2 (two) times daily., Disp: 30 each, Rfl: 0 .  amLODipine (NORVASC) 10 MG tablet, Take 10 mg by mouth daily., Disp: , Rfl:  .  aspirin 81 MG tablet, Take 81 mg by mouth daily., Disp: , Rfl:  .  azaTHIOprine (IMURAN) 50 MG tablet, Take 2 in am and 1 in pm, Disp: , Rfl:  .  folic acid (FOLVITE) 619 MCG tablet, Take 400 mcg by mouth daily., Disp: , Rfl:  .  levothyroxine (SYNTHROID, LEVOTHROID) 25 MCG tablet, Take 25 mcg by mouth daily., Disp: , Rfl:  .  metFORMIN (GLUCOPHAGE-XR) 500 MG 24 hr tablet, Take 1 tablet by mouth 2 (two) times daily., Disp: , Rfl:  .  NOVOLIN N RELION 100 UNIT/ML injection, 22 Units. 20 units in morning,22 units in evening., Disp: , Rfl:  .  predniSONE (DELTASONE) 5 MG tablet, Take 5 mg by mouth daily with breakfast., Disp: ,  Rfl:    Review of Systems     Objective:   Physical Exam  Constitutional: [name] oriented to person, place, and time. He appears well-developed and well-nourished. No distress.  HENT:  Head: Normocephalic and atraumatic.  Right Ear: External ear normal.  Left Ear: External ear normal.  Mouth/Throat: Oropharynx is clear and moist. No oropharyngeal exudate.  Eyes: Conjunctivae and EOM are normal. Pupils are equal, round, and reactive to light. Right eye exhibits no discharge. Left eye exhibits no discharge. No scleral icterus.  Neck: Normal range of motion. Neck supple. No JVD present. No tracheal deviation present. No thyromegaly present.  Cardiovascular: Normal rate, regular rhythm and intact distal pulses. Exam reveals no gallop and no friction rub.  No murmur heard. Pulmonary/Chest: Effort normal. No respiratory distress. He has no wheezes. He has rales. He exhibits no tenderness.  Abdominal: Soft. Bowel sounds are normal. He exhibits no distension and no mass. There is no tenderness. There is no rebound and no guarding.  Musculoskeletal: Normal range of motion. He exhibits no edema or tenderness.  Lymphadenopathy:    He has no cervical adenopathy.  Neurological: [name] alert and oriented to person, place, and time. He has normal reflexes. No cranial nerve deficit. Coordination normal.  Skin: Skin is warm and dry. No rash noted. [name] not diaphoretic. No erythema. No pallor.  Psychiatric: He has a normal mood and affect. His behavior is normal. Judgment and thought content normal.  Nursing note and vitals reviewed.  Vitals:   12/20/17 1240  BP: 130/62  Pulse: 85  SpO2: 95%  Weight: 196 lb (88.9 kg)  Height: _0  (1.676 m)    Estimated body mass index is 31.64 kg/m as calculated from the following:   Height as of this encounter: _1  (1.676 m).   Weight as of this encounter: 196 lb (88.9 kg).     Assessment:       ICD-10-CM   1. 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 RA  (RA-ILD) J84.10        Plan:       Lung function has stablilized after starting rituxan  Plan contnue rituxan and immuran  per rheum Use o2 at night as before ecourage you to use o2 with exertion Please talk to PCP Merrilee Seashore, MD -  and ensure you get  shingarix vaccine   Followup In 6-8 months do Pre-bd spiro and dlco only. No lung volume or bd response. No post-bd spiro Return to se Dr Chase Caller in 6-8 months after breathing test    Dr. Brand Males, [name], [facility].C.P Pulmonary and Critical Care Medicine Staff Physician, [place] Director - Interstitial Lung Disease  Program  Pulmonary [place] at [place], [place], [address]  Pager: [phone], If no answer or between  15:00h - 7:00h: call [phone] Telephone: [phone]

## 2017-12-20 NOTE — Progress Notes (Signed)
Spirometry and Dlco done today. 

## 2017-12-20 NOTE — Patient Instructions (Signed)
ICD-10-CM   1. 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) J84.10     Lung function has stablilized after starting rituxan  Plan contnue rituxan and immuran per rheum Use o2 at night as before ecourage you to use o2 with exertion Please talk to PCP Merrilee Seashore, MD -  and ensure you get  shingarix vaccine   Followup In 6-8 months do Pre-bd spiro and dlco only. No lung volume or bd response. No post-bd spiro Return to se Dr Chase Caller in 6-8 months after breathing test

## 2017-12-29 DIAGNOSIS — J8410 Pulmonary fibrosis, unspecified: Secondary | ICD-10-CM | POA: Diagnosis not present

## 2017-12-29 DIAGNOSIS — Z13820 Encounter for screening for osteoporosis: Secondary | ICD-10-CM | POA: Diagnosis not present

## 2017-12-29 DIAGNOSIS — M0589 Other rheumatoid arthritis with rheumatoid factor of multiple sites: Secondary | ICD-10-CM | POA: Diagnosis not present

## 2017-12-29 DIAGNOSIS — M150 Primary generalized (osteo)arthritis: Secondary | ICD-10-CM | POA: Diagnosis not present

## 2017-12-29 DIAGNOSIS — Z79899 Other long term (current) drug therapy: Secondary | ICD-10-CM | POA: Diagnosis not present

## 2017-12-31 DIAGNOSIS — R0902 Hypoxemia: Secondary | ICD-10-CM | POA: Diagnosis not present

## 2018-01-06 ENCOUNTER — Telehealth: Payer: Self-pay | Admitting: Internal Medicine

## 2018-01-06 NOTE — Telephone Encounter (Signed)
Called and spoke with patient advised him that we are placing the sample up front for him. Nothing further needed.

## 2018-01-12 DIAGNOSIS — Z0000 Encounter for general adult medical examination without abnormal findings: Secondary | ICD-10-CM | POA: Diagnosis not present

## 2018-01-12 DIAGNOSIS — Z23 Encounter for immunization: Secondary | ICD-10-CM | POA: Diagnosis not present

## 2018-01-12 DIAGNOSIS — E1065 Type 1 diabetes mellitus with hyperglycemia: Secondary | ICD-10-CM | POA: Diagnosis not present

## 2018-01-12 DIAGNOSIS — I10 Essential (primary) hypertension: Secondary | ICD-10-CM | POA: Diagnosis not present

## 2018-01-12 DIAGNOSIS — E782 Mixed hyperlipidemia: Secondary | ICD-10-CM | POA: Diagnosis not present

## 2018-01-18 DIAGNOSIS — M0589 Other rheumatoid arthritis with rheumatoid factor of multiple sites: Secondary | ICD-10-CM | POA: Diagnosis not present

## 2018-01-18 DIAGNOSIS — J8410 Pulmonary fibrosis, unspecified: Secondary | ICD-10-CM | POA: Diagnosis not present

## 2018-01-18 DIAGNOSIS — I10 Essential (primary) hypertension: Secondary | ICD-10-CM | POA: Diagnosis not present

## 2018-01-18 DIAGNOSIS — E782 Mixed hyperlipidemia: Secondary | ICD-10-CM | POA: Diagnosis not present

## 2018-01-18 DIAGNOSIS — E1065 Type 1 diabetes mellitus with hyperglycemia: Secondary | ICD-10-CM | POA: Diagnosis not present

## 2018-01-31 DIAGNOSIS — R0902 Hypoxemia: Secondary | ICD-10-CM | POA: Diagnosis not present

## 2018-03-02 DIAGNOSIS — R0902 Hypoxemia: Secondary | ICD-10-CM | POA: Diagnosis not present

## 2018-03-17 DIAGNOSIS — M0589 Other rheumatoid arthritis with rheumatoid factor of multiple sites: Secondary | ICD-10-CM | POA: Diagnosis not present

## 2018-03-30 DIAGNOSIS — M150 Primary generalized (osteo)arthritis: Secondary | ICD-10-CM | POA: Diagnosis not present

## 2018-03-30 DIAGNOSIS — Z13820 Encounter for screening for osteoporosis: Secondary | ICD-10-CM | POA: Diagnosis not present

## 2018-03-30 DIAGNOSIS — J8410 Pulmonary fibrosis, unspecified: Secondary | ICD-10-CM | POA: Diagnosis not present

## 2018-03-30 DIAGNOSIS — M0589 Other rheumatoid arthritis with rheumatoid factor of multiple sites: Secondary | ICD-10-CM | POA: Diagnosis not present

## 2018-03-30 DIAGNOSIS — Z79899 Other long term (current) drug therapy: Secondary | ICD-10-CM | POA: Diagnosis not present

## 2018-04-02 DIAGNOSIS — R0902 Hypoxemia: Secondary | ICD-10-CM | POA: Diagnosis not present

## 2018-04-04 DIAGNOSIS — M0589 Other rheumatoid arthritis with rheumatoid factor of multiple sites: Secondary | ICD-10-CM | POA: Diagnosis not present

## 2018-05-02 DIAGNOSIS — R0902 Hypoxemia: Secondary | ICD-10-CM | POA: Diagnosis not present

## 2018-06-02 DIAGNOSIS — R0902 Hypoxemia: Secondary | ICD-10-CM | POA: Diagnosis not present

## 2018-06-28 DIAGNOSIS — M0589 Other rheumatoid arthritis with rheumatoid factor of multiple sites: Secondary | ICD-10-CM | POA: Diagnosis not present

## 2018-06-28 DIAGNOSIS — E782 Mixed hyperlipidemia: Secondary | ICD-10-CM | POA: Diagnosis not present

## 2018-06-28 DIAGNOSIS — E1065 Type 1 diabetes mellitus with hyperglycemia: Secondary | ICD-10-CM | POA: Diagnosis not present

## 2018-06-30 DIAGNOSIS — Z79899 Other long term (current) drug therapy: Secondary | ICD-10-CM | POA: Diagnosis not present

## 2018-06-30 DIAGNOSIS — J849 Interstitial pulmonary disease, unspecified: Secondary | ICD-10-CM | POA: Diagnosis not present

## 2018-06-30 DIAGNOSIS — Z13820 Encounter for screening for osteoporosis: Secondary | ICD-10-CM | POA: Diagnosis not present

## 2018-06-30 DIAGNOSIS — M0589 Other rheumatoid arthritis with rheumatoid factor of multiple sites: Secondary | ICD-10-CM | POA: Diagnosis not present

## 2018-06-30 DIAGNOSIS — M150 Primary generalized (osteo)arthritis: Secondary | ICD-10-CM | POA: Diagnosis not present

## 2018-07-03 DIAGNOSIS — R0902 Hypoxemia: Secondary | ICD-10-CM | POA: Diagnosis not present

## 2018-07-05 DIAGNOSIS — I10 Essential (primary) hypertension: Secondary | ICD-10-CM | POA: Diagnosis not present

## 2018-07-05 DIAGNOSIS — E1065 Type 1 diabetes mellitus with hyperglycemia: Secondary | ICD-10-CM | POA: Diagnosis not present

## 2018-07-05 DIAGNOSIS — Z23 Encounter for immunization: Secondary | ICD-10-CM | POA: Diagnosis not present

## 2018-07-05 DIAGNOSIS — E782 Mixed hyperlipidemia: Secondary | ICD-10-CM | POA: Diagnosis not present

## 2018-07-11 ENCOUNTER — Telehealth: Payer: Self-pay | Admitting: Internal Medicine

## 2018-07-11 MED ORDER — ACLIDINIUM BROMIDE 400 MCG/ACT IN AEPB: 1.0000 | INHALATION_SPRAY | Freq: Two times a day (BID) | RESPIRATORY_TRACT | 0 refills | Status: DC

## 2018-07-11 NOTE — Telephone Encounter (Signed)
Pt is calling back [phone] 

## 2018-07-11 NOTE — Telephone Encounter (Signed)
Called and spoke with Patient.  Patient is requesting samples of [name].  Samples placed in bag at front desk.  Nothing further at this time.

## 2018-07-11 NOTE — Telephone Encounter (Signed)
LMTCB 07/11/18

## 2018-07-22 ENCOUNTER — Ambulatory Visit: Payer: Medicare Other | Admitting: Internal Medicine

## 2018-08-01 ENCOUNTER — Encounter: Payer: Self-pay | Admitting: Internal Medicine

## 2018-08-01 ENCOUNTER — Ambulatory Visit: Payer: Medicare Other | Admitting: Internal Medicine

## 2018-08-01 ENCOUNTER — Ambulatory Visit (INDEPENDENT_AMBULATORY_CARE_PROVIDER_SITE_OTHER): Payer: Medicare Other | Admitting: Internal Medicine

## 2018-08-01 VITALS — BP 128/72 | HR 85 | Ht 64.76 in | Wt 191.6 lb

## 2018-08-01 DIAGNOSIS — R05 Cough: Secondary | ICD-10-CM

## 2018-08-01 DIAGNOSIS — R053 Chronic cough: Secondary | ICD-10-CM

## 2018-08-01 DIAGNOSIS — Z23 Encounter for immunization: Secondary | ICD-10-CM

## 2018-08-01 DIAGNOSIS — D899 Disorder involving the immune mechanism, unspecified: Secondary | ICD-10-CM | POA: Diagnosis not present

## 2018-08-01 DIAGNOSIS — J8410 Pulmonary fibrosis, unspecified: Secondary | ICD-10-CM

## 2018-08-01 DIAGNOSIS — D849 Immunodeficiency, unspecified: Secondary | ICD-10-CM

## 2018-08-01 LAB — PULMONARY FUNCTION TEST
DL/VA % pred: 95 %
DL/VA: 4.05 ml/min/mmHg/L
DLCO unc % pred: 52 %
DLCO unc: 13.24 ml/min/mmHg
FEF 25-75 Pre: 2.74 L/sec
FEF2575-%Pred-Pre: 146 %
FEV1-%Pred-Pre: 84 %
FEV1-Pre: 1.87 L
FEV1FVC-%Pred-Pre: 117 %
FEV6-%Pred-Pre: 73 %
FEV6-Pre: 2.09 L
FEV6FVC-%Pred-Pre: 106 %
FVC-%Pred-Pre: 69 %
FVC-Pre: 2.09 L
Pre FEV1/FVC ratio: 89 %
Pre FEV6/FVC Ratio: 100 %

## 2018-08-01 NOTE — Addendum Note (Signed)
Addended by: Lorretta Harp on: 08/01/2018 11:09 AM   Modules accepted: Orders

## 2018-08-01 NOTE — Progress Notes (Signed)
PFT completed today as ordered. 08/01/18

## 2018-08-01 NOTE — Progress Notes (Addendum)
OV 01/01/2017  Chief Complaint  Patient presents with  . Follow-up    Pt here after PFT. Pt states overall his breathing is unchanged since last OV. Pt c/o occ dry cough. Pt deneis CP/tightness and f/c/s.    Follow-up interstitial lung disease UIP pattern associated with rheumatoid arthritis and immune suppression. Last visit in March 2017. He doesn't that overall he has been stable. He has a new rheumatologist but still at [facility] [address] [name]. His wife also says he is stable. However as he started talking to them his wife slowly started telling me that for the last few to several months he is wheezing more than usual and is a little more short of breath than usual. However he denied it. This is compliant with his medications. [name] not on any inhaler therapy. He uses oxygen only at night. Previous CT scans of the chest in September 2016 in March 2017 did not report any associated emphysema. His most recent pulmonary function test is shown that on Combivent. I personally visualized the graph: [name] Kitchen Forced vital capacity is stable with a diffusion capacity seems to decline over time. Walking desaturation test 185 feet 3 laps on room air: He desaturated to 84%. And then we walked him on 2 L oxygen he did not desaturate.   OV 06/23/2017  Chief Complaint  Patient presents with  . Follow-up    pt here after CT in 04/2017. Pt states his breathing is unchanged since last OV. Pt c/o interemittent dry cough. Pt denies CP/tightness and f/c/s.      Follow-up interstitial lung disease UIP pattern associated with rheumatoid arthritis and on Imuran. Last visit in March 2018 I was concerned about progressive interstitial lung disease because he started desaturating on room air and his FVC show decline. Currently rated a high-resolution CT chest that confirmed progressive lung disease. We walked him 185 feet 3 laps on room air and he desaturated at third lap to 89%. This is very  similar to previous visit suggesting that  that his decline has been more gradual over the last 1 year. Compared to last visit he feels stable. His wife feels the same. [name] aware of his worsening fibrosis and the potential need to change anti fibrotics  OV 09/27/2017  Chief Complaint  Patient presents with  . Follow-up    PFT done today. Pt states that he thinks his breathing has become worse. Has complaints of SOB with exertion, occ. dry cough. Denies any CP. States that he does wear O2 at night, DME: [facility].     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  Patient is on Imuran.  [name] having progressive disease this year 2018.  He told my certified medical assistant that he is having progressive shortness of breath but to me he tells me that he is stable.  As always he is a very poor historian.  Wife gives a better history.  He has oxygen at home for the night which she uses but does not use his exertional small portable system even though this helps him.  They do admit that dyspnea is progressive.  In fact pulmonary function test today shows progressive disease.  He also seem to desaturate a little bit more easily.  He continues to be on Imuran.  In talking to him it appears his rheumatologist has now started him on Rituxan.  He has had 2 doses 2 weeks apart with the  most recent one being mid November 2018.  The next dose is at mid May 2019 which is 41-monthinterval.  He says he is up-to-date with his flu shot but is not had a conversation with his primary care physician about the new shingles vaccine that is inactivated vaccine.  [name] not having much of joint issues.  [name] aware that the Rituxan is specifically designed to slow down the progression of pulmonary fibrosis.  Walking desaturation test 185 feet x3 laps on room air with a forehead probe.  Resting heart rate was 95/min.  Final heart rate 108/min.  Resting pulse ox was 95%.  By the time he  finished his second lap he desaturated to 84%.  He was then placed on 2 L nasal cannula and he was able to maintain his saturation to 95%.   OV 12/20/2017  Chief Complaint  Patient presents with  . Follow-up    follow up PFT,SOB with activity/exertion   [name] is here for follow-up because of his progressive UIP pattern pulmonary fibrosis related to rheumatoid arthritis.  At his last visit he was started on Rituxan by rheumatology.  Since then we started him on portable oxygen but he says he is not using it because he does not like to use it.  He does use oxygen at night which is helping.  His next dose of Rituxan is in May 2019.  Both he and his wife attest that since the start of Rituxan his symptoms are stable particularly shortness of breath is not any worse.  In fact pulmonary function test today shows continued ongoing stability with FVC and DLCO would suggest that the Rituxan might be helping him.  There are no new issues.  [name] here to have a shingles vaccine.  I told him about this last visit to talk to primary care physician but he does not remember this.   IMPRESSION: June 2018 1. Continued interval progression of fibrotic interstitial lung disease with extensive honeycombing, asymmetrically involving the right lung, with mild basilar predominance, diagnostic of usual interstitial pneumonia (UIP) . 2. Stable mild reactive mediastinal lymphadenopathy. 3. Aortic atherosclerosis.  One vessel coronary atherosclerosis.   Electronically Signed   By: [name]   On: 04/09/2017 10:51   OV 08/01/2018  Subjective:  Patient ID: [name] , DOB: [id number], age 72y.o. , MRN: [id number], ADDRESS: [address] [place] [id number]  08/01/2018 -   Chief Complaint  Patient presents with  . Follow-up    PFT today, increased coughing (non-productive)    Follow-up interstitial lung disease UIP pattern associated with rheumatoid arthritis. Rx Rituxan since approx  Oct/Nov 2018. Background Immuran  HPI LGroverScales 72y.o. -presents as usual with his wife.  He tells me that he continues on background Imuran.  [name] also on scheduled Rituxan.  He has an infusion coming up in a month or so.  This is according to his wife.  He continues to be a poor historian.  As best as I can gather from both of them is that his shortness of breath is actually a little bit better ever since starting Rituxan..  In fact his PFTs show continued improvement/stability.  With his FVC and DLCO.  However he tells me that his cough might be a little bit worse.  In talking to him it appears that is only mildly worse.  It happens mostly at night when he lies down.  He coughs  a few times and then it does not bother him.  The cough is dry.  He denies any postnasal drip or acid reflux disease.  He does not have any fever or sputum production.     Results for JAXTEN, BROSH (MRN [id number]) as of 09/27/2017 10:06  Ref. Range 01/23/2014 15:38 10/01/2014 11:24 07/29/2015 11:09 12/18/2016 09:34 09/27/2017 08:44 Start Rituxan 12/20/2017  08/01/2018   FVC-Pre Latest Units: L 2.18 2.05 2.17 2.21 2.06 2.1 2.09  FVC-%Pred-Pre Latest Units: % 66 62 66 68 64 65% 69%  Results for GARHETT, BERNHARD (MRN [id number]) as of 09/27/2017 10:06  Ref. Range 01/23/2014 15:38 10/01/2014 11:24 07/29/2015 11:09 12/18/2016 09:34 09/27/2017 08:44 12/20/2017  08/01/2018   DLCO unc Latest Units: ml/min/mmHg 17.93 16.86 16.72 13.81 12.14 12.3 13.24  DLCO unc % pred Latest Units: % 66 62 62 51 45 45% 52%     Simple office walk 185 feet x  3 laps goal with forehead probe 08/01/2018   O2 used Room air  Number laps completed 3  Comments about pace normal  Resting Pulse Ox/HR 100% and 80/min  Final Pulse Ox/HR 94% and 114/min  Desaturated </= 88% no  Desaturated <= 3% points yes  Got Tachycardic >/= 90/min yes  Symptoms at end of test x  Miscellaneous comments x      ROS - per HPI     has a past medical history of  Arthritis, Diabetes mellitus, Hypertension, Pneumonia, and Thyroid disease.   reports that he quit smoking about 33 years ago. His smoking use included cigarettes. He has a 5.00 pack-year smoking history. He has never used smokeless tobacco.  Past Surgical History:  Procedure Laterality Date  . KNEE ARTHROSCOPY    . NO PAST SURGERIES      No Known Allergies  Immunization History  Administered Date(s) Administered  . Influenza Split 08/03/2011, 07/03/2012, 09/02/2013, 07/03/2014  . Influenza, High Dose Seasonal PF 08/02/2016, 08/02/2017, 07/25/2018  . Influenza,inj,Quad PF,6+ Mos 07/29/2015  . Pneumococcal Polysaccharide-23 11/03/2007    Family History  Problem Relation Age of Onset  . Diabetes Mother   . Stroke Mother      Current Outpatient Medications:  .  Aclidinium Bromide ([name] PRESSAIR) 400 MCG/ACT AEPB, Inhale 1 puff into the lungs 2 (two) times daily., Disp: 2 each, Rfl: 0 .  amLODipine (NORVASC) 10 MG tablet, Take 10 mg by mouth daily., Disp: , Rfl:  .  aspirin 81 MG tablet, Take 81 mg by mouth daily., Disp: , Rfl:  .  azaTHIOprine (IMURAN) 50 MG tablet, Take 2 in am and 1 in pm, Disp: , Rfl:  .  folic acid (FOLVITE) 165 MCG tablet, Take 400 mcg by mouth daily., Disp: , Rfl:  .  levothyroxine (SYNTHROID, LEVOTHROID) 25 MCG tablet, Take 25 mcg by mouth daily., Disp: , Rfl:  .  metFORMIN (GLUCOPHAGE-XR) 500 MG 24 hr tablet, Take 1 tablet by mouth 2 (two) times daily., Disp: , Rfl:  .  NOVOLIN N RELION 100 UNIT/ML injection, 22 Units. 20 units in morning,22 units in evening., Disp: , Rfl:  .  predniSONE (DELTASONE) 5 MG tablet, Take 5 mg by mouth daily with breakfast., Disp: , Rfl:       Objective:   Vitals:   08/01/18 1020  BP: 128/72  Pulse: 85  SpO2: 95%  Weight: 191 lb 9.6 oz (86.9 kg)  Height: 5' 4.76" (1.645 m)    Estimated body mass index is 32.12 kg/m as calculated from the following:  Height as of this encounter: 5' 4.76" (1.645 m).   Weight as  of this encounter: 191 lb 9.6 oz (86.9 kg).  _0 @  Filed Weights   08/01/18 1020  Weight: 191 lb 9.6 oz (86.9 kg)     Physical Exam  General Appearance:    Alert, cooperative, no distress, appears stated age - yes , Deconditioned looking - mildyes , OBESE  - yes, Sitting on Wheelchair -  no  Head:    Normocephalic, without obvious abnormality, atraumatic  Eyes:    PERRL, conjunctiva/corneas clear,  Ears:    Normal TM's and external ear canals, both ears  Nose:   Nares normal, septum midline, mucosa normal, no drainage    or sinus tenderness. OXYGEN ON  - no . Patient is @ ra   Throat:   Lips, mucosa, and tongue normal; teeth and gums normal. Cyanosis on lips - no  Neck:   Supple, symmetrical, trachea midline, no adenopathy;    thyroid:  no enlargement/tenderness/nodules; no carotid   bruit or JVD  Back:     Symmetric, no curvature, ROM normal, no CVA tenderness  Lungs:     Distress - no , Wheeze no, Barrell Chest - no, Purse lip breathing - no, Crackles -Velcro crackles present  Chest Wall:    No tenderness or deformity.    Heart:    Regular rate and rhythm, S1 and S2 normal, no rub   or gallop, Murmur - no  Breast Exam:    NOT DONE  Abdomen:     Soft, non-tender, bowel sounds active all four quadrants,    no masses, no organomegaly. Visceral obesity -yes  Genitalia:   NOT DONE  Rectal:   NOT DONE  Extremities:   Extremities - normal, Has Cane - no, Clubbing - yes, Edema - no  Pulses:   2+ and symmetric all extremities  Skin:   Stigmata of Connective Tissue Disease - RA hands +  Lymph nodes:   Cervical, supraclavicular, and axillary nodes normal  Psychiatric:  Neurologic:   Pleasant - yes, Anxious - no, Flat affect - no.   CAm-ICU - neg, Alert and Oriented x 3 - yes, Moves all 4s - yes, Speech - normal, Cognition - intact           Assessment:       ICD-10-CM   1. 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) J84.10   2. Chronic cough R05   3.  Immunosuppressed status ([place]) D89.9        Plan:     Patient Instructions     ICD-10-CM   1. 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) J84.10   2. Chronic cough R05   3. Immunosuppressed status ([place]) D89.9    Clinically PFT better Cough - unlikel due to opportunistic infection but more likely due to post nasal drip  Plan - start/ take generic fluticasone inhaler 2 squirts each nostril daily -prevnar vaccine 08/01/2018 - continue o2 at night and encourage use at mornign with exertion - d/with Dr Kathlene November and start bactrim for prevention of opportunistic infection - continue immuran and rituxan per Dr Kathlene November  Followup Return to ILD clinic with Dr Chase Caller on Tuesday afternoon or Thursday Morning in 3 months  - simple walk test (not 6 mwd) to be done at followup - [place] to schedule on non-ILD clinic day in 30 minute slot         (>  50% of this 15 min visit spent in face to face counseling or/and coordination of care by this undersigned MD - Dr Brand Males. This includes one or more of the following documented above: discussion of test results, diagnostic or treatment recommendations, prognosis, risks and benefits of management options, instructions, education, compliance or risk-factor reduction)   SIGNATURE    Dr. Brand Males, [name], [name],  Pulmonary and Critical Care Medicine Staff Physician, [place] Director - Interstitial Lung Disease  Program  [facility] [place] [facility] [place], [place], [address]  Pager: [phone], If no answer or between  15:00h - 7:00h: call [phone] Telephone: [phone]  10:51 AM 08/01/2018

## 2018-08-01 NOTE — Patient Instructions (Addendum)
ICD-10-CM   1. 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) J84.10   2. Chronic cough R05   3. Immunosuppressed status ([place]) D89.9    Clinically PFT better Cough - unlikel due to opportunistic infection but more likely due to post nasal drip  Plan - start/ take generic fluticasone inhaler 2 squirts each nostril daily -prevnar vaccine 08/01/2018 - continue o2 at night and encourage use at mornign with exertion - d/with Dr Kathlene November and start bactrim for prevention of opportunistic infection - continue immuran and rituxan per Dr Kathlene November  Followup Return to ILD clinic with Dr Chase Caller on Tuesday afternoon or Thursday Morning in 3 months  - simple walk test (not 6 mwd) to be done at followup - [place] to schedule on non-ILD clinic day in 30 minute slot

## 2018-08-02 DIAGNOSIS — H25013 Cortical age-related cataract, bilateral: Secondary | ICD-10-CM | POA: Diagnosis not present

## 2018-08-02 DIAGNOSIS — H524 Presbyopia: Secondary | ICD-10-CM | POA: Diagnosis not present

## 2018-08-02 DIAGNOSIS — R0902 Hypoxemia: Secondary | ICD-10-CM | POA: Diagnosis not present

## 2018-08-02 DIAGNOSIS — H2513 Age-related nuclear cataract, bilateral: Secondary | ICD-10-CM | POA: Diagnosis not present

## 2018-08-02 DIAGNOSIS — E119 Type 2 diabetes mellitus without complications: Secondary | ICD-10-CM | POA: Diagnosis not present

## 2018-09-02 DIAGNOSIS — R0902 Hypoxemia: Secondary | ICD-10-CM | POA: Diagnosis not present

## 2018-10-02 DIAGNOSIS — R0902 Hypoxemia: Secondary | ICD-10-CM | POA: Diagnosis not present

## 2018-10-04 DIAGNOSIS — Z13820 Encounter for screening for osteoporosis: Secondary | ICD-10-CM | POA: Diagnosis not present

## 2018-10-04 DIAGNOSIS — M0589 Other rheumatoid arthritis with rheumatoid factor of multiple sites: Secondary | ICD-10-CM | POA: Diagnosis not present

## 2018-10-04 DIAGNOSIS — M8589 Other specified disorders of bone density and structure, multiple sites: Secondary | ICD-10-CM | POA: Diagnosis not present

## 2018-10-06 ENCOUNTER — Ambulatory Visit (INDEPENDENT_AMBULATORY_CARE_PROVIDER_SITE_OTHER): Payer: Medicare Other | Admitting: Internal Medicine

## 2018-10-06 ENCOUNTER — Encounter: Payer: Self-pay | Admitting: Internal Medicine

## 2018-10-06 VITALS — BP 132/60 | HR 86 | Ht 64.75 in | Wt 192.8 lb

## 2018-10-06 DIAGNOSIS — D849 Immunodeficiency, unspecified: Secondary | ICD-10-CM

## 2018-10-06 DIAGNOSIS — J8410 Pulmonary fibrosis, unspecified: Secondary | ICD-10-CM | POA: Diagnosis not present

## 2018-10-06 DIAGNOSIS — D899 Disorder involving the immune mechanism, unspecified: Secondary | ICD-10-CM

## 2018-10-06 MED ORDER — ACLIDINIUM BROMIDE 400 MCG/ACT IN AEPB: 1.0000 | INHALATION_SPRAY | Freq: Two times a day (BID) | RESPIRATORY_TRACT | 2 refills | Status: DC

## 2018-10-06 NOTE — Progress Notes (Signed)
OV 01/01/2017  Chief Complaint  Patient presents with  . Follow-up    Pt here after PFT. Pt states overall his breathing is unchanged since last OV. Pt c/o occ dry cough. Pt deneis CP/tightness and f/c/s.    Follow-up interstitial lung disease UIP pattern associated with rheumatoid arthritis and immune suppression. Last visit in March 2017. He doesn't that overall he has been stable. He has a new rheumatologist but still at [facility] Dr [name]. His wife also says he is stable. However as he started talking to them his wife slowly started telling me that for the last few to several months he is wheezing more than usual and is a little more short of breath than usual. However he denied it. This is compliant with his medications. [name] not on any inhaler therapy. He uses oxygen only at night. Previous CT scans of the chest in September 2016 in March 2017 did not report any associated emphysema. His most recent pulmonary function test is shown that on Combivent. I personally visualized the graph: [name] Kitchen Forced vital capacity is stable with a diffusion capacity seems to decline over time. Walking desaturation test 185 feet 3 laps on room air: He desaturated to 84%. And then we walked him on 2 L oxygen he did not desaturate.   OV 06/23/2017  Chief Complaint  Patient presents with  . Follow-up    pt here after CT in 04/2017. Pt states his breathing is unchanged since last OV. Pt c/o interemittent dry cough. Pt denies CP/tightness and f/c/s.      Follow-up interstitial lung disease UIP pattern associated with rheumatoid arthritis and on Imuran. Last visit in March 2018 I was concerned about progressive interstitial lung disease because he started desaturating on room air and his FVC show decline. Currently rated a high-resolution CT chest that confirmed progressive lung disease. We walked him 185 feet 3 laps on room air and he desaturated at third lap to 89%. This is very  similar to previous visit suggesting that  that his decline has been more gradual over the last 1 year. Compared to last visit he feels stable. His wife feels the same. [name] aware of his worsening fibrosis and the potential need to change anti fibrotics  OV 09/27/2017  Chief Complaint  Patient presents with  . Follow-up    PFT done today. Pt states that he thinks his breathing has become worse. Has complaints of SOB with exertion, occ. dry cough. Denies any CP. States that he does wear O2 at night, DME: [facility].     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  Patient is on Imuran.  [name] having progressive disease this year 2018.  He told my certified medical assistant that he is having progressive shortness of breath but to me he tells me that he is stable.  As always he is a very poor historian.  Wife gives a better history.  He has oxygen at home for the night which she uses but does not use his exertional small portable system even though this helps him.  They do admit that dyspnea is progressive.  In fact pulmonary function test today shows progressive disease.  He also seem to desaturate a little bit more easily.  He continues to be on Imuran.  In talking to him it appears his rheumatologist has now started him on Rituxan.  He has had 2 doses 2 weeks apart with the most recent  one being mid November 2018.  The next dose is at mid May 2019 which is 34-monthinterval.  He says he is up-to-date with his flu shot but is not had a conversation with his primary care physician about the new shingles vaccine that is inactivated vaccine.  [name] not having much of joint issues.  [name] aware that the Rituxan is specifically designed to slow down the progression of pulmonary fibrosis.  Walking desaturation test 185 feet x3 laps on room air with a forehead probe.  Resting heart rate was 95/min.  Final heart rate 108/min.  Resting pulse ox was 95%.  By the time he  finished his second lap he desaturated to 84%.  He was then placed on 2 L nasal cannula and he was able to maintain his saturation to 95%.   OV 12/20/2017  Chief Complaint  Patient presents with  . Follow-up    follow up PFT,SOB with activity/exertion   LFritz [name] is here for follow-up because of his progressive UIP pattern pulmonary fibrosis related to rheumatoid arthritis.  At his last visit he was started on Rituxan by rheumatology.  Since then we started him on portable oxygen but he says he is not using it because he does not like to use it.  He does use oxygen at night which is helping.  His next dose of Rituxan is in May 2019.  Both he and his wife attest that since the start of Rituxan his symptoms are stable particularly shortness of breath is not any worse.  In fact pulmonary function test today shows continued ongoing stability with FVC and DLCO would suggest that the Rituxan might be helping him.  There are no new issues.  [name] here to have a shingles vaccine.  I told him about this last visit to talk to primary care physician but he does not remember this.   IMPRESSION: June 2018 1. Continued interval progression of fibrotic interstitial lung disease with extensive honeycombing, asymmetrically involving the right lung, with mild basilar predominance, diagnostic of usual interstitial pneumonia (UIP) . 2. Stable mild reactive mediastinal lymphadenopathy. 3. Aortic atherosclerosis.  One vessel coronary atherosclerosis.   Electronically Signed   By: [name]   On: 04/09/2017 10:51   OV 08/01/2018  Subjective:  Patient ID: [name] , DOB: 6Oct 15, 1947, age 72y.o. , MRN: [id number], ADDRESS: [address] [place] [id number]  08/01/2018 -   Chief Complaint  Patient presents with  . Follow-up    PFT today, increased coughing (non-productive)    Follow-up interstitial lung disease UIP pattern associated with rheumatoid arthritis. Rx Rituxan since approx  Oct/Nov 2018. Background Immuran  HPI [name] 72y.o. -presents as usual with his wife.  He tells me that he continues on background Imuran.  [name] also on scheduled Rituxan.  He has an infusion coming up in a month or so.  This is according to his wife.  He continues to be a poor historian.  As best as I can gather from both of them is that his shortness of breath is actually a little bit better ever since starting Rituxan..  In fact his PFTs show continued improvement/stability.  With his FVC and DLCO.  However he tells me that his cough might be a little bit worse.  In talking to him it appears that is only mildly worse.  It happens mostly at night when he lies down.  He coughs a few  times and then it does not bother him.  The cough is dry.  He denies any postnasal drip or acid reflux disease.  He does not have any fever or sputum production.       OV 10/06/2018  Subjective:  Patient ID: [name], male , DOB: [birth year] , age 72 y.o. , MRN: [id number] , ADDRESS: [address] [place] [address]   10/06/2018 -   Chief Complaint  Patient presents with  . Follow-up    f/u pulmonary fibrosis, no symptoms, complaints, breathing doind well     HPI [name] 72 y.o. -returns for ILD follow-up in the setting of rheumatoid arthritis.  This time he is not here with his wife who has other schedule conflicts.  He tells me overall he stable.  He has now started Rituxan.  As again he is a poor historian.  I have him starting Rituxan a year ago but he told me that it was only started recently and has had only 1 infusion.  He tells me that he is stable.  His cough is well controlled with this anticholinergic he is using nighttime oxygen.  He says he is compliant with his medications.  Last visit I told him to inquire about using Bactrim prophylaxis in the setting of B-cell depletion in the setting of Rituxan.  He has no idea recollection of this conversation.  So I did tell him that we will check  his G6PD and discuss directly with his rheumatologist about starting Bactrim prophylaxis.  [name] okay with this plan.  Overall dyspnea and cough are stable and it is mild.  Walking desaturation test shows stability.    Results for [name], [name] (MRN [id number]) as of 09/27/2017 10:06  Ref. Range 01/23/2014 15:38 10/01/2014 11:24 07/29/2015 11:09 12/18/2016 09:34 09/27/2017 08:44 Start Rituxan 12/20/2017  08/01/2018   FVC-Pre Latest Units: L 2.18 2.05 2.17 2.21 2.06 2.1 2.09  FVC-%Pred-Pre Latest Units: % 66 62 66 68 64 65% 69%  Results for [name], [name] (MRN [id number]) as of 09/27/2017 10:06  Ref. Range 01/23/2014 15:38 10/01/2014 11:24 07/29/2015 11:09 12/18/2016 09:34 09/27/2017 08:44 12/20/2017  08/01/2018   DLCO unc Latest Units: ml/min/mmHg 17.93 16.86 16.72 13.81 12.14 12.3 13.24  DLCO unc % pred Latest Units: % 66 62 62 51 45 45% 52%     Simple office walk 08/01/2018 -  185 feet x  3 laps goal with forehead probe at [place]  10/06/2018 250 feet x 3 laps  O2 used Room air Room air  Number laps completed 3 3  Comments about pace normal normal  Resting Pulse Ox/HR 100% and 80/min 100% and 81/min  Final Pulse Ox/HR 94% and 114/min 91% and 111/min  Desaturated </= 88% no no  Desaturated <= 3% points yes yes  Got Tachycardic >/= 90/min yes yes  Symptoms at end of test x x  Miscellaneous comments x x     ROS - per HPI     has a past medical history of Arthritis, Diabetes mellitus, Hypertension, Pneumonia, and Thyroid disease.   reports that he quit smoking about 33 years ago. His smoking use included cigarettes. He has a 5.00 pack-year smoking history. He has never used smokeless tobacco.  Past Surgical History:  Procedure Laterality Date  . KNEE ARTHROSCOPY    . NO PAST SURGERIES      No Known Allergies  Immunization History  Administered Date(s) Administered  . Influenza Split 08/03/2011, 07/03/2012, 09/02/2013, 07/03/2014  . Influenza, High Dose  Seasonal PF  08/02/2016, 08/02/2017, 07/25/2018  . Influenza,inj,Quad PF,6+ Mos 07/29/2015  . Pneumococcal Conjugate-13 08/01/2018  . Pneumococcal Polysaccharide-23 11/03/2007    Family History  Problem Relation Age of Onset  . Diabetes Mother   . Stroke Mother      Current Outpatient Medications:  .  Aclidinium Bromide ([name] PRESSAIR) 400 MCG/ACT AEPB, Inhale 1 puff into the lungs 2 (two) times daily., Disp: 2 each, Rfl: 0 .  amLODipine (NORVASC) 10 MG tablet, Take 10 mg by mouth daily., Disp: , Rfl:  .  aspirin 81 MG tablet, Take 81 mg by mouth daily., Disp: , Rfl:  .  azaTHIOprine (IMURAN) 50 MG tablet, Take 2 in am and 1 in pm, Disp: , Rfl:  .  folic acid (FOLVITE) 884 MCG tablet, Take 400 mcg by mouth daily., Disp: , Rfl:  .  levothyroxine (SYNTHROID, LEVOTHROID) 25 MCG tablet, Take 25 mcg by mouth daily., Disp: , Rfl:  .  metFORMIN (GLUCOPHAGE-XR) 500 MG 24 hr tablet, Take 1 tablet by mouth 2 (two) times daily., Disp: , Rfl:  .  NOVOLIN N RELION 100 UNIT/ML injection, 22 Units. 20 units in morning,22 units in evening., Disp: , Rfl:  .  predniSONE (DELTASONE) 5 MG tablet, Take 5 mg by mouth daily with breakfast., Disp: , Rfl:       Objective:   Vitals:   10/06/18 0922  BP: 132/60  Pulse: 86  SpO2: 97%  Weight: 192 lb 12.8 oz (87.5 kg)  Height: 5' 4.75" (1.645 m)    Estimated body mass index is 32.33 kg/m as calculated from the following:   Height as of this encounter: 5' 4.75" (1.645 m).   Weight as of this encounter: 192 lb 12.8 oz (87.5 kg).  _0 @  Filed Weights   10/06/18 0922  Weight: 192 lb 12.8 oz (87.5 kg)     Physical Exam  General Appearance:    Alert, cooperative, no distress, appears stated age - yes , Deconditioned looking - no , OBESE  - yes, Sitting on Wheelchair -  no  Head:    Normocephalic, without obvious abnormality, atraumatic  Eyes:    PERRL, conjunctiva/corneas clear,  Ears:    Normal TM's and external ear canals, both ears  Nose:    Nares normal, septum midline, mucosa normal, no drainage    or sinus tenderness. OXYGEN ON  - no . Patient is @ ra   Throat:   Lips, mucosa, and tongue normal; teeth and gums normal. Cyanosis on lips - no  Neck:   Supple, symmetrical, trachea midline, no adenopathy;    thyroid:  no enlargement/tenderness/nodules; no carotid   bruit or JVD  Back:     Symmetric, no curvature, ROM normal, no CVA tenderness  Lungs:     Distress - no , Wheeze no, Barrell Chest - no, Purse lip breathing - no, Crackles - yes. Wheeze resolved   Chest Wall:    No tenderness or deformity.    Heart:    Regular rate and rhythm, S1 and S2 normal, no rub   or gallop, Murmur - no  Breast Exam:    NOT DONE  Abdomen:     Soft, non-tender, bowel sounds active all four quadrants,    no masses, no organomegaly. Visceral obesity - n  Genitalia:   NOT DONE  Rectal:   NOT DONE  Extremities:   Extremities - normal, Has Cane - no, Clubbing - no, Edema - no  Pulses:   2+ and  symmetric all extremities  Skin:   Stigmata of Connective Tissue Disease - mild RA  Lymph nodes:   Cervical, supraclavicular, and axillary nodes normal  Psychiatric:  Neurologic:   Pleasant - yes, Anxious - no, Flat affect - yes  CAm-ICU - neg, Alert and Oriented x 3 - yes, Moves all 4s - yes, Speech - normal, Cognition - intact           Assessment:       ICD-10-CM   1. 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) J84.10   2. Immunosuppressed status ([place]) D89.9 Glucose 6 phosphate dehydrogenase       Plan:     Patient Instructions     ICD-10-CM   1. 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) J84.10   2. Immunosuppressed status ([place]) D89.9     Pulmonary fibrosis is clinically stable It appears anticholinergic inhaler is helping her cough  Plan -Continue nighttime oxygen -Continue immunosuppression for rheumatoid arthritis through Imuran and Rituxan -Give blood work for G6PD -and if this is normal I will discuss with the  rheumatologist and start you on Bactrim 3 times a week to prevent infections in the setting of immunosuppression - Please talk to PCP Merrilee Seashore, MD -  and ensure you get  shingrix ([place]) inactivated vaccine against shingles  Follow-up -6 months do spirometry and DLCO -Return to ILD clinic in 6 months or sooner if needed     SIGNATURE    Dr. Brand Males, [name], [name],  Pulmonary and Critical Care Medicine Staff Physician, [place] Director - Interstitial Lung Disease  Program  Pulmonary [place] [facility] [place], [place], [address]  Pager: [phone], If no answer or between  15:00h - 7:00h: call [phone] Telephone: [phone]  9:33 AM 10/06/2018

## 2018-10-06 NOTE — Addendum Note (Signed)
Addended by: Suzzanne Cloud E on: 10/06/2018 09:44 AM   Modules accepted: Orders

## 2018-10-06 NOTE — Patient Instructions (Addendum)
ICD-10-CM   1. 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) J84.10   2. Immunosuppressed status ([place]) D89.9     Pulmonary fibrosis is clinically stable It appears anticholinergic inhaler is helping her cough  Plan -Continue nighttime oxygen -Continue immunosuppression for rheumatoid arthritis through Imuran and Rituxan -Give blood work for G6PD -and if this is normal I will discuss with the rheumatologist and start you on Bactrim 3 times a week to prevent infections in the setting of immunosuppression - Please talk to PCP Merrilee Seashore, MD -  and ensure you get  shingrix ([place]) inactivated vaccine against shingles  Follow-up -6 months do spirometry and DLCO -Return to ILD clinic in 6 months or sooner if needed

## 2018-10-07 ENCOUNTER — Telehealth: Payer: Self-pay | Admitting: Internal Medicine

## 2018-10-07 LAB — GLUCOSE 6 PHOSPHATE DEHYDROGENASE: G-6PDH: 14.4 U/g Hgb (ref 7.0–20.5)

## 2018-10-07 NOTE — Telephone Encounter (Signed)
G6PD normal.  Start bactrim 1 DS on Monday , Wed, Friday - take this as long as he is immunosuppressed

## 2018-10-10 DIAGNOSIS — M150 Primary generalized (osteo)arthritis: Secondary | ICD-10-CM | POA: Diagnosis not present

## 2018-10-10 DIAGNOSIS — Z79899 Other long term (current) drug therapy: Secondary | ICD-10-CM | POA: Diagnosis not present

## 2018-10-10 DIAGNOSIS — M0589 Other rheumatoid arthritis with rheumatoid factor of multiple sites: Secondary | ICD-10-CM | POA: Diagnosis not present

## 2018-10-10 DIAGNOSIS — J849 Interstitial pulmonary disease, unspecified: Secondary | ICD-10-CM | POA: Diagnosis not present

## 2018-10-10 DIAGNOSIS — Z13820 Encounter for screening for osteoporosis: Secondary | ICD-10-CM | POA: Diagnosis not present

## 2018-10-10 NOTE — Telephone Encounter (Signed)
Attempted to call pt but unable to reach him. Left message for pt to return call. 

## 2018-10-11 ENCOUNTER — Telehealth: Payer: Self-pay | Admitting: Internal Medicine

## 2018-10-11 MED ORDER — SULFAMETHOXAZOLE-TRIMETHOPRIM 800-160 MG PO TABS: 1.0000 | ORAL_TABLET | ORAL | 6 refills | Status: DC

## 2018-10-11 NOTE — Telephone Encounter (Signed)
Bactrim is 1 DS tablet - 3 times a week - Monday , wed, Friday (see below also). 30 days with 6 refills

## 2018-10-11 NOTE — Telephone Encounter (Signed)
MR please advise on dosage and quantity of medication.

## 2018-10-11 NOTE — Telephone Encounter (Signed)
Pt is calling back [phone]

## 2018-10-11 NOTE — Telephone Encounter (Signed)
Medication sent to correct pharmacy. Nothing further needed.

## 2018-10-11 NOTE — Telephone Encounter (Signed)
Prescription sent nothing further needed.

## 2018-10-17 ENCOUNTER — Ambulatory Visit: Payer: Medicare Other | Admitting: Nurse Practitioner

## 2018-10-17 ENCOUNTER — Encounter: Payer: Self-pay | Admitting: Nurse Practitioner

## 2018-10-17 ENCOUNTER — Telehealth: Payer: Self-pay | Admitting: Internal Medicine

## 2018-10-17 VITALS — BP 122/64 | HR 72 | Ht 64.75 in | Wt 189.0 lb

## 2018-10-17 DIAGNOSIS — J8410 Pulmonary fibrosis, unspecified: Secondary | ICD-10-CM | POA: Diagnosis not present

## 2018-10-17 DIAGNOSIS — T50905A Adverse effect of unspecified drugs, medicaments and biological substances, initial encounter: Secondary | ICD-10-CM

## 2018-10-17 DIAGNOSIS — R42 Dizziness and giddiness: Secondary | ICD-10-CM

## 2018-10-17 DIAGNOSIS — R1111 Vomiting without nausea: Secondary | ICD-10-CM | POA: Diagnosis not present

## 2018-10-17 NOTE — Progress Notes (Signed)
_0  ID: [name], male    DOB: 05/16/1946, 72 y.o.   MRN: [id number]  No chief complaint on file.   Referring provider: Merrilee Seashore, MD  HPI 72 year old male with pulmonary fibrosis (UIP on CT due to RA (RA-ILD) followed by Dr. Chase Caller.  Tests: CT chest 04/09/18 - Continued interval progression of fibrotic interstitial lung disease with extensive honeycombing, asymmetrically involving the right lung, with mild basilar predominance, diagnostic of usual interstitial pneumonia (UIP). Stable mild reactive mediastinal lymphadenopathy.  PFT: PFT Results Latest Ref Rng & Units 08/01/2018 12/20/2017 09/27/2017 12/18/2016 07/29/2015 10/01/2014 01/23/2014  FVC-Pre L 2.09 2.10 2.06 2.21 2.17 2.05 2.18  FVC-Predicted Pre % 69 65 64 68 66 62 66  FVC-Post L - - - 2.28 2.20 2.18 2.24  FVC-Predicted Post % - - - 70 67 66 68  Pre FEV1/FVC % % 89 88 88 86 86 87 86  Post FEV1/FCV % % - - - 88 89 87 88  FEV1-Pre L 1.87 1.84 1.81 1.92 1.88 1.78 1.88  FEV1-Predicted Pre % 84 77 75 80 77 72 76  FEV1-Post L - - - 2.01 1.95 1.90 1.97  DLCO UNC% % 52 45 45 51 62 62 66  DLCO COR %Predicted % 95 80 78 90 119 105 118  TLC L - - - - 3.91 3.67 4.28  TLC % Predicted % - - - - 62 59 68  RV % Predicted % - - - - 63 56 68    OV 10/17/18 - Medication reaction Patient presents today after having a reaction to a medication (Bactrim) last Friday. He was seen by Dr. Chase Caller on 10/06/2018 and placed on Bactrim 3 times a week to prevent infections in the setting of immunosuppression.  [name] on immunosuppression for rheumatoid arthritis with Imuran and Rituxan.  He took his first dose of Bactrim this past Friday (10/14/2018) and states that he felt sick after taking the medication.  He states that he vomited once and became dizzy.  He has not taken the Bactrim since that time and he only took that 1 dose.  He states that he did take the medication with his breakfast. He states that he felt well over the  weekend, but had the same symptoms again this morning.  He checked his blood sugar and blood pressure after becoming sick and vomiting at work this morning.  He states that both were stable.  His blood sugar was 110.  And his blood pressure was normal.  His vital signs are stable in the office today.  [name] not currently having any symptoms at this time.  He did not take a Bactrim today.  Is any fever, chest pain, or edema.    No Known Allergies  Immunization History  Administered Date(s) Administered  . Influenza Split 08/03/2011, 07/03/2012, 09/02/2013, 07/03/2014  . Influenza, High Dose Seasonal PF 08/02/2016, 08/02/2017, 07/25/2018  . Influenza,inj,Quad PF,6+ Mos 07/29/2015  . Pneumococcal Conjugate-13 08/01/2018  . Pneumococcal Polysaccharide-23 11/03/2007    Past Medical History:  Diagnosis Date  . Arthritis   . Diabetes mellitus   . Hypertension   . Pneumonia   . Thyroid disease     Tobacco History: Social History   Tobacco Use  Smoking Status Former Smoker  . Packs/day: 0.50  . Years: 10.00  . Pack years: 5.00  . Types: Cigarettes  . Last attempt to quit: 12/03/1984  . Years since quitting: 33.8  Smokeless Tobacco Never Used   Counseling  given: Yes   Outpatient Encounter Medications as of 10/17/2018  Medication Sig  . Aclidinium Bromide ([name] PRESSAIR) 400 MCG/ACT AEPB Inhale 1 puff into the lungs 2 (two) times daily.  [name] Kitchen amLODipine (NORVASC) 10 MG tablet Take 10 mg by mouth daily.  [name] Kitchen aspirin 81 MG tablet Take 81 mg by mouth daily.  [name] Kitchen atorvastatin (LIPITOR) 20 MG tablet Take 20 mg by mouth daily.  [name] Kitchen azaTHIOprine (IMURAN) 50 MG tablet Take 2 in am and 1 in pm  . folic acid (FOLVITE) 606 MCG tablet Take 400 mcg by mouth daily.  [name] Kitchen levothyroxine (SYNTHROID, LEVOTHROID) 25 MCG tablet Take 25 mcg by mouth daily.  . metFORMIN (GLUCOPHAGE-XR) 500 MG 24 hr tablet Take 1 tablet by mouth 2 (two) times daily.  [name] Kitchen NOVOLIN N RELION 100 UNIT/ML injection 22 Units. 20 units  in morning,22 units in evening.  . pantoprazole (PROTONIX) 40 MG tablet Take 40 mg by mouth daily.  . predniSONE (DELTASONE) 5 MG tablet Take 5 mg by mouth daily with breakfast.  . [DISCONTINUED] Aclidinium Bromide ([name] PRESSAIR) 400 MCG/ACT AEPB Inhale 1 puff into the lungs 2 (two) times daily. (Patient not taking: Reported on 10/17/2018)  . [DISCONTINUED] sulfamethoxazole-trimethoprim (BACTRIM DS,SEPTRA DS) 800-160 MG tablet Take 1 tablet by mouth 3 (three) times a week. (Patient not taking: Reported on 10/17/2018)   No facility-administered encounter medications on file as of 10/17/2018.      Review of Systems  Review of Systems  Constitutional: Negative.  Negative for chills and fever.  HENT: Negative.   Respiratory: Negative for cough, shortness of breath and wheezing.   Cardiovascular: Negative.  Negative for chest pain, palpitations and leg swelling.  Gastrointestinal: Positive for nausea.  Allergic/Immunologic: Negative.   Neurological: Negative.   Psychiatric/Behavioral: Negative.        Physical Exam  BP 122/64 (BP Location: Left Arm, Patient Position: Sitting, Cuff Size: Normal)   Pulse 72   Ht 5' 4.75" (1.645 m)   Wt 189 lb (85.7 kg)   SpO2 96%   BMI 31.69 kg/m   Wt Readings from Last 5 Encounters:  10/17/18 189 lb (85.7 kg)  10/06/18 192 lb 12.8 oz (87.5 kg)  08/01/18 191 lb 9.6 oz (86.9 kg)  12/20/17 196 lb (88.9 kg)  09/27/17 196 lb (88.9 kg)     Physical Exam Vitals signs and nursing note reviewed.  Constitutional:      General: [name] not in acute distress.    Appearance: [name] well-developed.  Cardiovascular:     Rate and Rhythm: Normal rate and regular rhythm.  Pulmonary:     Effort: Pulmonary effort is normal.     Breath sounds: Normal breath sounds.  Musculoskeletal:        General: No swelling.  Skin:    General: Skin is warm and dry.  Neurological:     Mental Status: [name] alert and oriented to person, place, and time.         Assessment & Plan:   Vomiting without nausea Unsure if this was a true reaction to the bactrim because he only took one dose of the medication and had the same symptoms this morning without taking the medication. This could be a viral illness. Will discuss with Dr. Chase Caller to decide which prophylaxis would be appropriate.   Patient Instructions  Patient is stable in office today Vital signs stable Please eat a bland diet over the next couple of days Stay well hydrated Get plenty of rest May  stop Bactrim Will discuss with Dr. Chase Caller which prophylactic antibiotic he would recommend Follow up with PCP if vomiting occurs again.  Please call if you need anything.      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) May stop bactrim Continue all other medications     Fenton Foy, NP 10/18/2018

## 2018-10-17 NOTE — Telephone Encounter (Signed)
Called and spoke with pt telling him recs per SG for him to stop taking medication and that we needed him to come in for OV. Pt expressed understanding. OV scheduled for pt with TN Wed. 12/18 at 9am.  Nothing further needed.

## 2018-10-17 NOTE — Telephone Encounter (Signed)
He needs to stop taking the  medication and come in to be seen to determine other options for chronic therapy.Thanks

## 2018-10-17 NOTE — Progress Notes (Signed)
_0  ID: [name], male    DOB: [birth year], 72 y.o.   MRN: [id number]  No chief complaint on file.   Referring provider: Merrilee Seashore, MD  HPI: HPI   Recent [facility] Encounters:     Tests:  Imaging:   Cardiac:   Labs:   Micro:   Chart Review:       No Known Allergies  Immunization History  Administered Date(s) Administered  . Influenza Split 08/03/2011, 07/03/2012, 09/02/2013, 07/03/2014  . Influenza, High Dose Seasonal PF 08/02/2016, 08/02/2017, 07/25/2018  . Influenza,inj,Quad PF,6+ Mos 07/29/2015  . Pneumococcal Conjugate-13 08/01/2018  . Pneumococcal Polysaccharide-23 11/03/2007    Past Medical History:  Diagnosis Date  . Arthritis   . Diabetes mellitus   . Hypertension   . Pneumonia   . Thyroid disease     Tobacco History: Social History   Tobacco Use  Smoking Status Former Smoker  . Packs/day: 0.50  . Years: 10.00  . Pack years: 5.00  . Types: Cigarettes  . Last attempt to quit: 12/03/1984  . Years since quitting: 33.8  Smokeless Tobacco Never Used   Counseling given: Not Answered   Outpatient Encounter Medications as of 10/17/2018  Medication Sig  . Aclidinium Bromide ([name] PRESSAIR) 400 MCG/ACT AEPB Inhale 1 puff into the lungs 2 (two) times daily.  [name] Kitchen amLODipine (NORVASC) 10 MG tablet Take 10 mg by mouth daily.  [name] Kitchen aspirin 81 MG tablet Take 81 mg by mouth daily.  [name] Kitchen atorvastatin (LIPITOR) 20 MG tablet Take 20 mg by mouth daily.  [name] Kitchen azaTHIOprine (IMURAN) 50 MG tablet Take 2 in am and 1 in pm  . folic acid (FOLVITE) 712 MCG tablet Take 400 mcg by mouth daily.  [name] Kitchen levothyroxine (SYNTHROID, LEVOTHROID) 25 MCG tablet Take 25 mcg by mouth daily.  . metFORMIN (GLUCOPHAGE-XR) 500 MG 24 hr tablet Take 1 tablet by mouth 2 (two) times daily.  [name] Kitchen NOVOLIN N RELION 100 UNIT/ML injection 22 Units. 20 units in morning,22 units in evening.  . pantoprazole (PROTONIX) 40 MG tablet Take 40 mg by mouth daily.  . predniSONE  (DELTASONE) 5 MG tablet Take 5 mg by mouth daily with breakfast.  . [DISCONTINUED] Aclidinium Bromide ([name] PRESSAIR) 400 MCG/ACT AEPB Inhale 1 puff into the lungs 2 (two) times daily. (Patient not taking: Reported on 10/17/2018)  . [DISCONTINUED] sulfamethoxazole-trimethoprim (BACTRIM DS,SEPTRA DS) 800-160 MG tablet Take 1 tablet by mouth 3 (three) times a week. (Patient not taking: Reported on 10/17/2018)   No facility-administered encounter medications on file as of 10/17/2018.      Review of Systems  Review of Systems     Physical Exam  BP 122/64 (BP Location: Left Arm, Patient Position: Sitting, Cuff Size: Normal)   Pulse 72   Ht 5' 4.75" (1.645 m)   Wt 189 lb (85.7 kg)   SpO2 96%   BMI 31.69 kg/m   Wt Readings from Last 5 Encounters:  10/17/18 189 lb (85.7 kg)  10/06/18 192 lb 12.8 oz (87.5 kg)  08/01/18 191 lb 9.6 oz (86.9 kg)  12/20/17 196 lb (88.9 kg)  09/27/17 196 lb (88.9 kg)     Physical Exam   Lab Results:  CBC    Component Value Date/Time   WBC 6.3 12/16/2011 0455   RBC 4.25 12/16/2011 0455   HGB 12.3 (L) 12/16/2011 0455   HCT 37.7 (L) 12/16/2011 0455   PLT 253 12/16/2011 0455   MCV 88.7 12/16/2011 0455   MCH 28.9 12/16/2011 0455   MCHC  32.6 12/16/2011 0455   RDW 15.1 12/16/2011 0455   LYMPHSABS 0.5 (L) 12/15/2011 1855   MONOABS 0.0 (L) 12/15/2011 1855   EOSABS 0.0 12/15/2011 1855   BASOSABS 0.0 12/15/2011 1855    BMET    Component Value Date/Time   NA 136 12/16/2011 0455   K 4.5 12/16/2011 0455   CL 103 12/16/2011 0455   CO2 24 12/16/2011 0455   GLUCOSE 241 (H) 12/16/2011 0455   BUN 17 12/16/2011 0455   CREATININE 0.87 12/16/2011 0455   CALCIUM 8.5 12/16/2011 0455   GFRNONAA 88 (L) 12/16/2011 0455   GFRAA >90 12/16/2011 0455    BNP No results found for: BNP  ProBNP    Component Value Date/Time   PROBNP 103.7 12/15/2011 1855    Imaging: No results found.   Assessment & Plan:   No problem-specific Assessment & Plan  notes found for this encounter.     Fenton Foy, NP 10/17/2018

## 2018-10-17 NOTE — Telephone Encounter (Signed)
Called and spoke with patient, he stated that he took the first dose of bactrim on Friday morning around 0800. He started showing symptoms of nausea, vomiting, dizziness, and increased shortness of breath at 1200. He vomited a few times Friday afternoon. No fever present. No symptoms since that day. Has not taken pill since. The pill is supposed to be taken on Monday, Wednesday, and Friday. Patient has not taken pill today due to being worried he is allergic to the medicine. Per new protocol and MR being in the hospital, SG please advise, thank you.

## 2018-10-17 NOTE — Patient Instructions (Signed)
Patient is stable in office today Vital signs stable Please eat a bland diet over the next couple of days Stay well hydrated Get plenty of rest May stop Bactrim Will discuss with Dr. Chase Caller which prophylactic antibiotic he would recommend Follow up with PCP if vomiting occurs again.  Please call if you need anything.

## 2018-10-17 NOTE — Telephone Encounter (Signed)
See other phone note. Patients appointment has been scheduled for today.

## 2018-10-18 ENCOUNTER — Encounter: Payer: Self-pay | Admitting: Nurse Practitioner

## 2018-10-18 DIAGNOSIS — T50905A Adverse effect of unspecified drugs, medicaments and biological substances, initial encounter: Secondary | ICD-10-CM | POA: Insufficient documentation

## 2018-10-18 DIAGNOSIS — R1111 Vomiting without nausea: Secondary | ICD-10-CM | POA: Insufficient documentation

## 2018-10-18 LAB — CBC WITH DIFFERENTIAL/PLATELET
Absolute Monocytes: 331 cells/uL (ref 200–950)
Basophils Absolute: 41 cells/uL (ref 0–200)
Basophils Relative: 0.9 %
Eosinophils Absolute: 101 cells/uL (ref 15–500)
Eosinophils Relative: 2.2 %
HCT: 38.5 % (ref 38.5–50.0)
Hemoglobin: 12.8 g/dL — ABNORMAL LOW (ref 13.2–17.1)
Lymphs Abs: 405 cells/uL — ABNORMAL LOW (ref 850–3900)
MCH: 32.2 pg (ref 27.0–33.0)
MCHC: 33.2 g/dL (ref 32.0–36.0)
MCV: 97 fL (ref 80.0–100.0)
MPV: 10.1 fL (ref 7.5–12.5)
Monocytes Relative: 7.2 %
Neutro Abs: 3721 cells/uL (ref 1500–7800)
Neutrophils Relative %: 80.9 %
Platelets: 194 10*3/uL (ref 140–400)
RBC: 3.97 10*6/uL — ABNORMAL LOW (ref 4.20–5.80)
RDW: 16.5 % — ABNORMAL HIGH (ref 11.0–15.0)
Total Lymphocyte: 8.8 %
WBC: 4.6 10*3/uL (ref 3.8–10.8)

## 2018-10-18 LAB — BASIC METABOLIC PANEL
BUN/Creatinine Ratio: 15 (calc) (ref 6–22)
BUN: 18 mg/dL (ref 7–25)
CO2: 28 mmol/L (ref 20–32)
Calcium: 9.1 mg/dL (ref 8.6–10.3)
Chloride: 104 mmol/L (ref 98–110)
Creat: 1.21 mg/dL — ABNORMAL HIGH (ref 0.70–1.18)
Glucose, Bld: 75 mg/dL (ref 65–99)
Potassium: 4.5 mmol/L (ref 3.5–5.3)
Sodium: 142 mmol/L (ref 135–146)

## 2018-10-18 NOTE — Assessment & Plan Note (Signed)
Unsure if this was a true reaction to the bactrim because he only took one dose of the medication and had the same symptoms this morning without taking the medication. This could be a viral illness. Will discuss with Dr. Chase Caller to decide which prophylaxis would be appropriate.   Patient Instructions  Patient is stable in office today Vital signs stable Please eat a bland diet over the next couple of days Stay well hydrated Get plenty of rest May stop Bactrim Will discuss with Dr. Chase Caller which prophylactic antibiotic he would recommend Follow up with PCP if vomiting occurs again.  Please call if you need anything.

## 2018-10-18 NOTE — Assessment & Plan Note (Signed)
May stop bactrim Continue all other medications

## 2018-10-19 ENCOUNTER — Encounter: Payer: Self-pay | Admitting: General Surgery

## 2018-10-19 ENCOUNTER — Ambulatory Visit: Payer: Medicare Other | Admitting: Nurse Practitioner

## 2018-10-20 ENCOUNTER — Ambulatory Visit: Payer: Medicare Other | Admitting: Nurse Practitioner

## 2018-10-20 DIAGNOSIS — M0589 Other rheumatoid arthritis with rheumatoid factor of multiple sites: Secondary | ICD-10-CM | POA: Diagnosis not present

## 2018-10-21 ENCOUNTER — Other Ambulatory Visit: Payer: Self-pay | Admitting: General Surgery

## 2018-10-21 ENCOUNTER — Encounter: Payer: Self-pay | Admitting: Nurse Practitioner

## 2018-10-21 DIAGNOSIS — R7989 Other specified abnormal findings of blood chemistry: Secondary | ICD-10-CM

## 2018-10-21 NOTE — Progress Notes (Signed)
Thank you.

## 2018-10-21 NOTE — Progress Notes (Signed)
Patient ID: [name], male   DOB: 1946-08-12, 72 y.o.   MRN: [id number]   Kenney Houseman - Dapsone would be an options but would wait a few weeks before starting. Please check uptodate or call pharmacy for dosing for propyhylaxis

## 2018-10-21 NOTE — Progress Notes (Signed)
Dr. Chase Caller, please advise as to which alternative prophylactic antibiotic you would like the patient to take. Patient had a possible reaction to bactrim - vomiting. Thank you.

## 2018-10-31 ENCOUNTER — Other Ambulatory Visit (INDEPENDENT_AMBULATORY_CARE_PROVIDER_SITE_OTHER): Payer: Medicare Other

## 2018-10-31 DIAGNOSIS — R7989 Other specified abnormal findings of blood chemistry: Secondary | ICD-10-CM | POA: Diagnosis not present

## 2018-10-31 LAB — BASIC METABOLIC PANEL
BUN: 8 mg/dL (ref 6–23)
CO2: 31 mEq/L (ref 19–32)
Calcium: 8.3 mg/dL — ABNORMAL LOW (ref 8.4–10.5)
Chloride: 103 mEq/L (ref 96–112)
Creatinine, Ser: 0.83 mg/dL (ref 0.40–1.50)
GFR: 116.62 mL/min (ref >60.00)
Glucose, Bld: 120 mg/dL — ABNORMAL HIGH (ref 70–99)
Potassium: 4 mEq/L (ref 3.5–5.1)
Sodium: 142 mEq/L (ref 135–145)

## 2018-11-02 DIAGNOSIS — R0902 Hypoxemia: Secondary | ICD-10-CM | POA: Diagnosis not present

## 2018-12-03 DIAGNOSIS — R0902 Hypoxemia: Secondary | ICD-10-CM | POA: Diagnosis not present

## 2018-12-29 ENCOUNTER — Telehealth: Payer: Self-pay | Admitting: Internal Medicine

## 2018-12-29 MED ORDER — ACLIDINIUM BROMIDE 400 MCG/ACT IN AEPB: 1.0000 | INHALATION_SPRAY | Freq: Two times a day (BID) | RESPIRATORY_TRACT | 0 refills | Status: DC

## 2018-12-29 NOTE — Telephone Encounter (Signed)
Called and spoke with Patient.  He called requesting [name] samples.  Samples placed at font desk for pick up .  Nothing further at this time.

## 2019-01-01 DIAGNOSIS — J129 Viral pneumonia, unspecified: Secondary | ICD-10-CM | POA: Diagnosis not present

## 2019-01-01 DIAGNOSIS — R092 Respiratory arrest: Secondary | ICD-10-CM | POA: Diagnosis not present

## 2019-01-09 DIAGNOSIS — M0589 Other rheumatoid arthritis with rheumatoid factor of multiple sites: Secondary | ICD-10-CM | POA: Diagnosis not present

## 2019-01-09 DIAGNOSIS — Z79899 Other long term (current) drug therapy: Secondary | ICD-10-CM | POA: Diagnosis not present

## 2019-01-09 DIAGNOSIS — J849 Interstitial pulmonary disease, unspecified: Secondary | ICD-10-CM | POA: Diagnosis not present

## 2019-01-09 DIAGNOSIS — Z13820 Encounter for screening for osteoporosis: Secondary | ICD-10-CM | POA: Diagnosis not present

## 2019-01-09 DIAGNOSIS — M150 Primary generalized (osteo)arthritis: Secondary | ICD-10-CM | POA: Diagnosis not present

## 2019-01-17 DIAGNOSIS — D72819 Decreased white blood cell count, unspecified: Secondary | ICD-10-CM | POA: Diagnosis not present

## 2019-01-31 DIAGNOSIS — E782 Mixed hyperlipidemia: Secondary | ICD-10-CM | POA: Diagnosis not present

## 2019-01-31 DIAGNOSIS — M0589 Other rheumatoid arthritis with rheumatoid factor of multiple sites: Secondary | ICD-10-CM | POA: Diagnosis not present

## 2019-01-31 DIAGNOSIS — E1065 Type 1 diabetes mellitus with hyperglycemia: Secondary | ICD-10-CM | POA: Diagnosis not present

## 2019-01-31 DIAGNOSIS — J4540 Moderate persistent asthma, uncomplicated: Secondary | ICD-10-CM | POA: Diagnosis not present

## 2019-01-31 DIAGNOSIS — Z0000 Encounter for general adult medical examination without abnormal findings: Secondary | ICD-10-CM | POA: Diagnosis not present

## 2019-01-31 DIAGNOSIS — I10 Essential (primary) hypertension: Secondary | ICD-10-CM | POA: Diagnosis not present

## 2019-02-01 DIAGNOSIS — R092 Respiratory arrest: Secondary | ICD-10-CM | POA: Diagnosis not present

## 2019-02-01 DIAGNOSIS — J129 Viral pneumonia, unspecified: Secondary | ICD-10-CM | POA: Diagnosis not present

## 2019-02-03 ENCOUNTER — Ambulatory Visit: Payer: Medicare Other | Admitting: Internal Medicine

## 2019-03-03 DIAGNOSIS — R0902 Hypoxemia: Secondary | ICD-10-CM | POA: Diagnosis not present

## 2019-03-03 DIAGNOSIS — J129 Viral pneumonia, unspecified: Secondary | ICD-10-CM | POA: Diagnosis not present

## 2019-03-21 ENCOUNTER — Telehealth: Payer: Self-pay | Admitting: Internal Medicine

## 2019-03-21 NOTE — Telephone Encounter (Signed)
Attempted to call pt x2 but each time I tried calling, immediately got a recording stating that the number I dialed could not take my call at this time. Left message for pt to return call.

## 2019-03-21 NOTE — Telephone Encounter (Signed)
Pt is calling back [phone]

## 2019-03-21 NOTE — Telephone Encounter (Signed)
Called and spoke with Patient's Wife.  She stated Patient has had increased, non productive  cough for 3 weeks. Patients Wife stated he has tried OTC drug store brand cough DM  Medications, with no relief.  Patient's Wife stated cough is worse in the morning and at bedtime. Patient's Wife denies any other symptoms, denies being around anyone sick. Patient requested a cough medication to be called in to [facility]. Per Lynelle Smoke, NP- schedule Patient video visit with NP, 03/22/19.  Patient scheduled with Kenney Houseman, NP 1000, 03/22/19.  Patient's Wife stated they have my chart, but they have not done a video visit.  Went over instructions for video visit with Wife. Patient's cell number is [phone], in case of video problems.

## 2019-03-22 ENCOUNTER — Other Ambulatory Visit: Payer: Self-pay

## 2019-03-22 ENCOUNTER — Ambulatory Visit: Payer: Medicare Other | Admitting: Nurse Practitioner

## 2019-03-22 ENCOUNTER — Encounter: Payer: Self-pay | Admitting: Nurse Practitioner

## 2019-03-22 ENCOUNTER — Other Ambulatory Visit: Payer: Self-pay | Admitting: General Surgery

## 2019-03-22 ENCOUNTER — Ambulatory Visit (INDEPENDENT_AMBULATORY_CARE_PROVIDER_SITE_OTHER): Payer: Medicare Other | Admitting: Nurse Practitioner

## 2019-03-22 DIAGNOSIS — J8410 Pulmonary fibrosis, unspecified: Secondary | ICD-10-CM

## 2019-03-22 DIAGNOSIS — Z5181 Encounter for therapeutic drug level monitoring: Secondary | ICD-10-CM

## 2019-03-22 MED ORDER — DAPSONE 100 MG PO TABS: 100.0000 mg | ORAL_TABLET | Freq: Every day | ORAL | 0 refills | Status: DC

## 2019-03-22 MED ORDER — BENZONATATE 200 MG PO CAPS: 200.0000 mg | ORAL_CAPSULE | Freq: Three times a day (TID) | ORAL | 1 refills | Status: DC | PRN

## 2019-03-22 MED ORDER — LORATADINE 10 MG PO TABS: 10.0000 mg | ORAL_TABLET | Freq: Every day | ORAL | 0 refills | Status: DC

## 2019-03-22 NOTE — Patient Instructions (Signed)
Will start dapsone daily due to immunosuppression from Imuran and Rituxan Will order Claritin daily Will order Tessalon Perles Will check baseline labs since starting dapsone  Follow up:  Follow up in 2 weeks with tele-visit or sooner if needed

## 2019-03-22 NOTE — Assessment & Plan Note (Signed)
Pulmonary fibrosis / cough Patient has a tele-visit today for cough.  He states his symptoms started 1 week ago.  States that his cough is nonproductive.  States that he does have chest congestion.  He denies any sinus congestion or postnasal drip.  He denies any recent fever.  Patient is on immunosuppression for rheumatoid arthritis with Imuran and Rituxan.  He was started on Bactrim for prophylaxis but could not tolerate the medication.  Patient was switched from Bactrim to dapsone but he states that he never started the dapsone.  He denies any significant shortness of breath.  [name] compliant with [place].  We will start him on dapsone today and order labs.  Will order antihistamine and Tessalon Perles for cough.  Patient Instructions  Will start dapsone daily due to immunosuppression from Imuran and Rituxan Will order Claritin daily Will order Tessalon Perles Will check baseline labs since starting dapsone  Follow up:  Follow up in 2 weeks with tele-visit or sooner if needed

## 2019-03-22 NOTE — Progress Notes (Signed)
Virtual Visit via Telephone Note  I connected with [name] on 03/22/19 at 10:00 AM EDT by telephone and verified that I am speaking with the correct person using two identifiers.  Location: Patient: home Provider: office   I discussed the limitations, risks, security and privacy concerns of performing an evaluation and management service by telephone and the availability of in person appointments. I also discussed with the patient that there may be a patient responsible charge related to this service. The patient expressed understanding and agreed to proceed.   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 Chase Caller.  Patient has a tele-visit today for cough.  He states his symptoms started 1 week ago.  States that his cough is nonproductive.  States that he does have chest congestion.  He denies any sinus congestion or postnasal drip.  He denies any recent fever.  Patient is a poor historian. Patient is on immunosuppression for rheumatoid arthritis with Imuran and Rituxan.  He was started on Bactrim for prophylaxis but could not tolerate the medication.  Patient was switched from Bactrim to dapsone but he states that he never started the dapsone.  He denies any significant shortness of breath.  [name] compliant with [place]. Denies f/c/s, n/v/d, hemoptysis, PND, leg swelling.     Observations/Objective:  CT chest 04/09/18 - Continued interval progression of fibrotic interstitial lung disease with extensive honeycombing, asymmetrically involving the right lung, with mild basilar predominance, diagnostic of usual interstitial pneumonia (UIP). Stable mild reactive mediastinal lymphadenopathy.  PFT Results Latest Ref Rng & Units 08/01/2018 12/20/2017 09/27/2017 12/18/2016 07/29/2015 10/01/2014 01/23/2014  FVC-Pre L 2.09 2.10 2.06 2.21 2.17 2.05 2.18  FVC-Predicted Pre % 69 65 64 68 66 62 66  FVC-Post L - - - 2.28 2.20 2.18 2.24   FVC-Predicted Post % - - - 70 67 66 68  Pre FEV1/FVC % % 89 88 88 86 86 87 86  Post FEV1/FCV % % - - - 88 89 87 88  FEV1-Pre L 1.87 1.84 1.81 1.92 1.88 1.78 1.88  FEV1-Predicted Pre % 84 77 75 80 77 72 76  FEV1-Post L - - - 2.01 1.95 1.90 1.97  DLCO UNC% % 52 45 45 51 62 62 66  DLCO COR %Predicted % 95 80 78 90 119 105 118  TLC L - - - - 3.91 3.67 4.28  TLC % Predicted % - - - - 62 59 68  RV % Predicted % - - - - 63 56 68     Assessment and Plan: Pulmonary fibrosis / cough Patient has a tele-visit today for cough.  He states his symptoms started 1 week ago.  States that his cough is nonproductive.  States that he does have chest congestion.  He denies any sinus congestion or postnasal drip.  He denies any recent fever.  Patient is a poor historian.  Patient is on immunosuppression for rheumatoid arthritis with Imuran and Rituxan.  He was recently started on Bactrim for prophylaxis but could not tolerate the medication.  Patient was switched from Bactrim to Dapsone, but he states that he never started the dapsone.  He denies any significant shortness of breath.  [name] compliant with [place].  We will start him on dapsone today and order labs.  Will order antihistamine and Tessalon Perles for cough.  Patient Instructions  Will start dapsone daily due to immunosuppression from Imuran and Rituxan Will order  Claritin daily Will order Tessalon Perles Will check baseline labs since starting dapsone    Follow Up Instructions:  Follow up in 2 weeks with tele-visit or sooner if needed    I discussed the assessment and treatment plan with the patient. The patient was provided an opportunity to ask questions and all were answered. The patient agreed with the plan and demonstrated an understanding of the instructions.   The patient was advised to call back or seek an in-person evaluation if the symptoms worsen or if the condition fails to improve as anticipated.  I provided 22 minutes of  non-face-to-face time during this encounter.   Fenton Foy, NP

## 2019-03-23 ENCOUNTER — Telehealth: Payer: Self-pay | Admitting: Nurse Practitioner

## 2019-03-23 NOTE — Telephone Encounter (Signed)
Patient really needs to be on this medication. Please check to see if He could get it covered by insurance. He tried and failed bactrim already.

## 2019-03-23 NOTE — Telephone Encounter (Signed)
Called and spoke with pt. Pt is stating he needs another med prescribed other than the dapsone as he is unable to afford it. Tonya, please advise on this for pt. Thanks!

## 2019-03-23 NOTE — Telephone Encounter (Signed)
Left message for patient to call back.

## 2019-03-24 ENCOUNTER — Telehealth: Payer: Self-pay | Admitting: Nurse Practitioner

## 2019-03-24 MED ORDER — AZITHROMYCIN 250 MG PO TABS: ORAL_TABLET | ORAL | 0 refills | Status: DC

## 2019-03-24 NOTE — Telephone Encounter (Signed)
Spoke with pt, he is very confused about what medications he is on. He states the medication is 40 dollars and he cannot afford that. He stated he is not taking the medication Tonya mentioned in the [facility] note below. I am not sure what to do then to have pt come into the office to be seen so we can sort this out. Please advise TN  Patient Instructions by Fenton Foy, NP at 03/22/2019 10:00 AM  Author: Fenton Foy, NP Author Type: Nurse Practitioner Filed: 03/22/2019 1:21 PM  Note Status: Signed Cosign: Cosign Not Required Encounter Date: 03/22/2019  Editor: Fenton Foy, NP (Nurse Practitioner)    Will start dapsone daily due to immunosuppression from Imuran and Rituxan Will order Claritin daily Will order Tessalon Perles Will check baseline labs since starting dapsone  Follow up:  Follow up in 2 weeks with tele-visit or sooner if needed

## 2019-03-24 NOTE — Progress Notes (Signed)
Spoke with patient today. [name] unable to afford Dapsone. Will order a course of azithromycin and discuss antibiotic prophylaxis options with Dr. Chase Caller.

## 2019-03-24 NOTE — Addendum Note (Signed)
Addended by: Fenton Foy on: 03/24/2019 02:21 PM   Modules accepted: Orders

## 2019-03-24 NOTE — Telephone Encounter (Signed)
I will call patient to discuss this. Thanks.

## 2019-03-24 NOTE — Telephone Encounter (Signed)
Called pt's pharmacy and spoke with [facility] checking on what med was needing clarification and per [name], she was needing dx code for the zpak but she already was able to get dx code taken care of. Nothing further needed.

## 2019-03-29 NOTE — Telephone Encounter (Signed)
Yes. I spoke with Patient and ordered him an antibiotic. Waiting on reply from Dr. Chase Caller for new order for antibiotic for prophylaxis. Thanks.

## 2019-03-29 NOTE — Telephone Encounter (Signed)
Has the pt been contacted? Thanks!

## 2019-03-30 NOTE — Telephone Encounter (Signed)
Antibiotic ordered by Kenney Houseman, NP.  Kenney Houseman is waiting on MR to respond.

## 2019-04-03 DIAGNOSIS — R0902 Hypoxemia: Secondary | ICD-10-CM | POA: Diagnosis not present

## 2019-04-03 DIAGNOSIS — J129 Viral pneumonia, unspecified: Secondary | ICD-10-CM | POA: Diagnosis not present

## 2019-04-03 NOTE — Telephone Encounter (Signed)
Per TN, Waiting on reply from Dr. Chase Caller for new order for antibiotic for prophylaxis.  MR, please advise.

## 2019-04-04 NOTE — Telephone Encounter (Signed)
Called spoke with patient and discussed MR's recommendations as stated below Patient voiced his understanding and will continue maintenance meds as he has been doing, minus the dapsone Patient is aware to contact the office if he develops any symptoms Dapsone has been removed from med list  Will sign off and route to [place] NP to make her aware of MR's recommendations as well

## 2019-04-04 NOTE — Telephone Encounter (Signed)
[name] on dapsone to prevent opportunisitc infection. Alternative is bactrim but he developed allergy to it. Alterative then is not to take dapsone because of cost but he has to accept infection risk which he has done so far for years

## 2019-04-05 ENCOUNTER — Ambulatory Visit (INDEPENDENT_AMBULATORY_CARE_PROVIDER_SITE_OTHER): Payer: Medicare Other | Admitting: Nurse Practitioner

## 2019-04-05 ENCOUNTER — Encounter: Payer: Self-pay | Admitting: Nurse Practitioner

## 2019-04-05 ENCOUNTER — Other Ambulatory Visit: Payer: Self-pay

## 2019-04-05 DIAGNOSIS — J8410 Pulmonary fibrosis, unspecified: Secondary | ICD-10-CM | POA: Diagnosis not present

## 2019-04-05 NOTE — Progress Notes (Signed)
Virtual Visit via Telephone Note  I connected with [name] on 04/05/19 at 11:00 AM EDT by telephone and verified that I am speaking with the correct person using two identifiers.  Location: Patient: home Provider: office   I discussed the limitations, risks, security and privacy concerns of performing an evaluation and management service by telephone and the availability of in person appointments. I also discussed with the patient that there may be a patient responsible charge related to this service. The patient expressed understanding and agreed to proceed.   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 Chase Caller.  Patient has a tele-visit today for a follow-up.  He was last seen by me through tele-visit on 03/22/2019.  He was prescribed azithromycin, but patient waited and started the medication just 2 days ago.  He does still complain of dry cough.  Denies any sinus congestion or postnasal drip.  He denies any recent fever.  Patient is on immunosuppression for rheumatoid arthritis with Imuran.  Patient states that he has not taken Rituxan in months.Patient was ordered Dapsone to prevent opportunistic infection.  Alternative medication was Bactrim but patient developed allergy to this medication.  He was not able to afford dapsone because of cost.  Denies f/c/s, n/v/d, hemoptysis, PND, leg swelling.  Patient is compliant with [place].    Observations/Objective: CT chest 04/09/18 -Continued interval progression of fibrotic interstitial lung disease with extensive honeycombing, asymmetrically involving the right lung, with mild basilar predominance, diagnostic of usual interstitial pneumonia (UIP).Stable mild reactive mediastinal lymphadenopathy.  PFT: PFT Results Latest Ref Rng & Units 08/01/2018 12/20/2017 09/27/2017 12/18/2016 07/29/2015 10/01/2014 01/23/2014  FVC-Pre L 2.09 2.10 2.06 2.21 2.17 2.05 2.18  FVC-Predicted  Pre % 69 65 64 68 66 62 66  FVC-Post L - - - 2.28 2.20 2.18 2.24  FVC-Predicted Post % - - - 70 67 66 68  Pre FEV1/FVC % % 89 88 88 86 86 87 86  Post FEV1/FCV % % - - - 88 89 87 88  FEV1-Pre L 1.87 1.84 1.81 1.92 1.88 1.78 1.88  FEV1-Predicted Pre % 84 77 75 80 77 72 76  FEV1-Post L - - - 2.01 1.95 1.90 1.97  DLCO UNC% % 52 45 45 51 62 62 66  DLCO COR %Predicted % 95 80 78 90 119 105 118  TLC L - - - - 3.91 3.67 4.28  TLC % Predicted % - - - - 62 59 68  RV % Predicted % - - - - 63 56 68     Assessment and Plan: Pulmonary fibrosis: Patient has a tele-visit today for a follow-up.  He was last seen by me through tele-visit on 03/22/2019.  He was prescribed azithromycin, but patient waited and started the medication just 2 days ago.  He does still complain of dry cough.  Denies any sinus congestion or postnasal drip.  He denies any recent fever.  Patient is on immunosuppression for rheumatoid arthritis with Imuran.  Patient states that he has not taken Rituxan in months.Patient was ordered Dapsone to prevent opportunistic infection.  Alternative medication was Bactrim but patient developed allergy to this medication.  He was not able to afford dapsone because of cost. We discussed that the alterative then is not to take dapsone because of cost but he has to accept infection risk which he has done so far for years.  Patient Instructions  Please complete entire course  of azithromycin May take Claritin daily Continue [name]    Follow Up Instructions: Follow up in 3 months with Dr. Chase Caller or sooner if needed   I discussed the assessment and treatment plan with the patient. The patient was provided an opportunity to ask questions and all were answered. The patient agreed with the plan and demonstrated an understanding of the instructions.   The patient was advised to call back or seek an in-person evaluation if the symptoms worsen or if the condition fails to improve as anticipated.  I  provided 22 minutes of non-face-to-face time during this encounter.   Fenton Foy, NP

## 2019-04-05 NOTE — Patient Instructions (Signed)
Please complete entire course of azithromycin May take Claritin daily Continue [name]   Follow up:  Follow up in 3 months with Dr. Chase Caller or sooner if needed

## 2019-04-05 NOTE — Assessment & Plan Note (Addendum)
Pulmonary fibrosis: Patient has a tele-visit today for a follow-up.  He was last seen by me through tele-visit on 03/22/2019.  He was prescribed azithromycin, but patient waited and started the medication just 2 days ago.  He does still complain of dry cough.  Denies any sinus congestion or postnasal drip.  He denies any recent fever.  Patient is on immunosuppression for rheumatoid arthritis with Imuran.  Patient states that he has not taken Rituxan in months.Patient was ordered Dapsone to prevent opportunistic infection.  Alternative medication was Bactrim but patient developed allergy to this medication.  He was not able to afford dapsone because of cost. We discussed that the alterative then is not to take dapsone because of cost but he has to accept infection risk which he has done so far for years.  Patient Instructions  Please complete entire course of azithromycin May take Claritin daily Continue [name]   Follow up:  Follow up in 3 months with Dr. Chase Caller or sooner if needed

## 2019-04-10 ENCOUNTER — Telehealth: Payer: Self-pay | Admitting: Nurse Practitioner

## 2019-04-10 MED ORDER — ACLIDINIUM BROMIDE 400 MCG/ACT IN AEPB: 1.0000 | INHALATION_SPRAY | Freq: Two times a day (BID) | RESPIRATORY_TRACT | 0 refills | Status: DC

## 2019-04-10 NOTE — Telephone Encounter (Signed)
Patient called.  Had recent OV and is requesting sample of [name].  Informed patient we will provide one sample and place at front door for pick up.  Patient acknowledged understanding.  Nothing further needed.

## 2019-04-11 DIAGNOSIS — Z13820 Encounter for screening for osteoporosis: Secondary | ICD-10-CM | POA: Diagnosis not present

## 2019-04-11 DIAGNOSIS — J849 Interstitial pulmonary disease, unspecified: Secondary | ICD-10-CM | POA: Diagnosis not present

## 2019-04-11 DIAGNOSIS — M150 Primary generalized (osteo)arthritis: Secondary | ICD-10-CM | POA: Diagnosis not present

## 2019-04-11 DIAGNOSIS — M0589 Other rheumatoid arthritis with rheumatoid factor of multiple sites: Secondary | ICD-10-CM | POA: Diagnosis not present

## 2019-04-11 DIAGNOSIS — Z79899 Other long term (current) drug therapy: Secondary | ICD-10-CM | POA: Diagnosis not present

## 2019-04-25 ENCOUNTER — Telehealth: Payer: Self-pay | Admitting: Internal Medicine

## 2019-04-25 NOTE — Telephone Encounter (Signed)
Called and spoke with pt stating to him that we needed to get him scheduled for an OV. Pt expressed understanding. OV scheduled for pt tomorrow at 11:45 with Wyn Quaker. Nothing further needed.

## 2019-04-25 NOTE — Telephone Encounter (Signed)
Pt needs an office visit -this is a chronic problem that seems to be getting worse    Has not had any chest xray in 2 years.  On immunosuppression   Please set up in office with Dr. Chase Caller or APP    Please contact office for sooner follow up if symptoms do not improve or worsen or seek emergency care

## 2019-04-25 NOTE — Telephone Encounter (Signed)
Primary Pulmonologist: MR Last office visit and with whom: 04/05/2019 with TN What do we see them for (pulmonary problems):pulmonary fibrosis Last OV assessment/plan: Instructions    Return in about 3 months (around 07/06/2019) for follow up [place]. Please complete entire course of azithromycin May take Claritin daily Continue [name]   Follow up:  Follow up in 3 months with Dr. Chase Caller or sooner if needed     Was appointment offered to patient (explain)?  Pt wants meds prescribed   Reason for call: Called and spoke with pt who states he has been having problems with his breathing which has been going on since last OV with TN 6/3.  Pt stated he is also coughing and the cough is a dry cough.   Pt denies any complaints of fever, body aches, chills, or chest tightness.  Pt has been taking Delsym cough syrup which he said has not really helped with his cough. Pt also has been taking benzonatate which has not really helped with his cough. Pt is still doing the tudorza inhaler daily.  Pt is requesting to have meds prescribed to help with his symptoms. Pt also said that he needs a note for work stating that he has SOB. Tammy, please advise on this for pt. Thanks!

## 2019-04-25 NOTE — Progress Notes (Addendum)
_0  ID: [name], male    DOB: [birth year], 73 y.o.   MRN: [id number]  Chief Complaint  Patient presents with  . 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 RaChase Caller6/24/2020  - Visit   89ear old male former smoker followed in our office for interstitial lung disease.  ILD with UIP pattern seen on 2018 CT chest.  This is likely related to patient's known rheumatoid arthritis.  [facility] Patient is managed with Rituxan, Imuran, and Prednisone 35m61maily.  Patient has not been able to tolerate prophylactic antibiotics.  Patient had a reaction to Bactrim and cannot afford dapsone (40 dollars a month).  Last Rituxan infusion was 10/04/2018.  Patient reports he is a planned Rituxan infusion on 05/11/2019.  Patient continues to be maintained on nocturnal oxygen 2 L.  Patient is reporting that at home when he is walking from the living room to the kitchen about a distance of 20 to 40 feet his oxygen levels dropped to the mid 80s.  Patient will be walked in office today.  Overall patient is presenting to our office because he has had worsened symptoms of shortness of breath as well as cough over the last 3 to 6 months.  Patient has completed multiple tele-visits with our office but no in person office visits.  Patient also is needing a letter stating that he cannot physically work right now due to the worsening shortness of breath and his chronic lung disease during COVID-19.   Tests:   04/09/2017-CT chest high-res- continued interval progression of fibrotic interstitial lung  disease with extensive honeycombing, asymmetrically involving the right long with mild basilar predominance, diagnostic of UIP, stable mild reactive mediastinal lymphadenopathy  01/20/2016-CT chest without contrast- resolution of right lower lobe pulmonary nodule, no new pulmonary lesions or acute pulmonary findings, stable changes of ILD with fairly extensive honeycombing, stable mediastinal and hilar lymph nodes and collateral vasculature  07/25/2015-CT chest high-res- appearance of the lungs remains compatible with ILD although the distribution of findings is somewhat unusual that overall pattern is most compatible with UIP presumably a manifestation of rheumatoid lung in this patient with a history of rheumatoid arthritis, new 6 x 7 pulmonary nodule right lower lobe  08/01/2018-pulmonary function test--spirometry with DLCO-FVC 2.09 (69% predicted), ratio 89, FEV1 1.87 (84% predicted), DLCO 52  SIX MIN WALK 04/26/2019 10/06/2018 08/01/2018 09/27/2017 09/27/2017 06/23/2017 04/24/2015  Medications - - - - - - -  Supplimental Oxygen during Test? (L/min) - No No Yes No No No  O2 Flow Rate - - - 2 - - -  Type - - - Continuous - - -  Laps - - - - - - -  Partial Lap (in Meters) - - - - - - -  Baseline BP (sitting) - - - - - - -  Baseline Heartrate - - - - - - -  Baseline Dyspnea (Borg Scale) - - - - - - -  Baseline Fatigue (Borg Scale) - - - - - - -  Baseline SPO2 - - - - - - -  BP (sitting) - - - - - - -  Heartrate - - - - - - -  Dyspnea (Borg Scale) - - - - - - -  Fatigue (Borg Scale) - - - - - - -  SPO2 - - - - - - -  BP (sitting) - - - - - - -  Heartrate - - - - - - -  SPO2 - - - - - - -  Stopped or Paused before Six Minutes - - - - - - -  Distance Completed - - - - - - -  Tech Comments: Pt started walk w/heart rate at 95 and O2_0 . Did a slow to moderate pace walk.Pt tolarated well, by end of lap 3 HR _1  and O2 _2 . steady walk, no desat TA/CMA Pt walked at a normal pace completing all  required laps denying any complaints. Pt walked at a normal pace completing all required laps. Only complaint was SOB. - Pt c/o SOB. Denies pain  -  Provider Comments: - - - - - - -     FENO:  No results found for: NITRICOXIDE  PFT: PFT Results Latest Ref Rng & Units 08/01/2018 12/20/2017 09/27/2017 12/18/2016 07/29/2015 10/01/2014 01/23/2014  FVC-Pre L 2.09 2.10 2.06 2.21 2.17 2.05 2.18  FVC-Predicted Pre % 69 65 64 68 66 62 66  FVC-Post L - - - 2.28 2.20 2.18 2.24  FVC-Predicted Post % - - - 70 67 66 68  Pre FEV1/FVC % % 89 88 88 86 86 87 86  Post FEV1/FCV % % - - - 88 89 87 88  FEV1-Pre L 1.87 1.84 1.81 1.92 1.88 1.78 1.88  FEV1-Predicted Pre % 84 77 75 80 77 72 76  FEV1-Post L - - - 2.01 1.95 1.90 1.97  DLCO UNC% % 52 45 45 51 62 62 66  DLCO COR %Predicted % 95 80 78 90 119 105 118  TLC L - - - - 3.91 3.67 4.28  TLC % Predicted % - - - - 62 59 68  RV % Predicted % - - - - 63 56 68    Imaging: No results found.    Specialty Problems      Pulmonary Problems   Pneumonia   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)    07/25/2015-CT chest high-res- appearance of the lungs remains compatible with ILD although the distribution of findings is somewhat unusual that overall pattern is most compatible with UIP presumably a manifestation of rheumatoid lung in this patient with a history of rheumatoid arthritis, new 6 x 7 pulmonary nodule right lower lobe  01/20/2016-CT chest without contrast- resolution of right lower lobe pulmonary nodule, no new pulmonary lesions or acute pulmonary findings, stable changes of ILD with fairly extensive honeycombing, stable mediastinal and hilar lymph nodes and collateral vasculature  04/09/2017-CT chest high-res- continued interval progression of fibrotic interstitial lung disease with extensive honeycombing, asymmetrically involving the right long with mild basilar predominance, diagnostic of UIP, stable mild reactive mediastinal lymphadenopathy   08/01/2018-pulmonary function test--spirometry with DLCO-FVC 2.09 (69% predicted), ratio 89, FEV1 1.87 (84% predicted), DLCO 52       Chronic cough   Nodule of right lung   Wheezing on auscultation   Nocturnal hypoxemia      Allergies  Allergen Reactions  . Bactrim [Sulfamethoxazole-Trimethoprim]     Immunization History  Administered Date(s) Administered  . Influenza Split 08/03/2011, 07/03/2012, 09/02/2013, 07/03/2014  . Influenza, High Dose Seasonal PF 08/02/2016, 08/02/2017, 07/25/2018  . Influenza,inj,Quad PF,6+ Mos 07/29/2015  .  Pneumococcal Conjugate-13 08/01/2018  . Pneumococcal Polysaccharide-23 11/03/2007    Past Medical History:  Diagnosis Date  . Arthritis   . Diabetes mellitus   . Hypertension   . Pneumonia   . Thyroid disease     Tobacco History: Social History   Tobacco Use  Smoking Status Former Smoker  . Packs/day: 0.50  . Years: 10.00  . Pack years: 5.00  . Types: Cigarettes  . Quit date: 12/03/1984  . Years since quitting: 34.4  Smokeless Tobacco Never Used   Counseling given: Yes   Continue to not smoke  Outpatient Encounter Medications as of 04/26/2019  Medication Sig  . amLODipine (NORVASC) 10 MG tablet Take 10 mg by mouth daily.  [name] Kitchen aspirin 81 MG tablet Take 81 mg by mouth daily.  [name] Kitchen atorvastatin (LIPITOR) 20 MG tablet Take 20 mg by mouth daily.  [name] Kitchen azaTHIOprine (IMURAN) 50 MG tablet Take 2 in am and 1 in pm  . folic acid (FOLVITE) 494 MCG tablet Take 400 mcg by mouth daily.  [name] Kitchen levothyroxine (SYNTHROID, LEVOTHROID) 25 MCG tablet Take 25 mcg by mouth daily.  [name] Kitchen loratadine (CLARITIN) 10 MG tablet Take 1 tablet (10 mg total) by mouth daily.  . metFORMIN (GLUCOPHAGE-XR) 500 MG 24 hr tablet Take 1 tablet by mouth 2 (two) times daily.  [name] Kitchen NOVOLIN N RELION 100 UNIT/ML injection 22 Units. 20 units in morning,22 units in evening.  . pantoprazole (PROTONIX) 40 MG tablet Take 40 mg by mouth daily.  . predniSONE (DELTASONE) 1 MG tablet Take 3 mg by  mouth daily with breakfast.   . [DISCONTINUED] Aclidinium Bromide ([name] PRESSAIR) 400 MCG/ACT AEPB Inhale 1 puff into the lungs 2 (two) times daily.  . [DISCONTINUED] benzonatate (TESSALON) 200 MG capsule Take 1 capsule (200 mg total) by mouth 3 (three) times daily as needed for cough.  . Aclidinium Bromide ([name] PRESSAIR) 400 MCG/ACT AEPB Inhale 1 puff into the lungs 2 (two) times a day.  . [DISCONTINUED] Aclidinium Bromide ([name] PRESSAIR) 400 MCG/ACT AEPB Inhale 1 puff into the lungs 2 (two) times daily for 1 day.  . [DISCONTINUED] azithromycin (ZITHROMAX) 250 MG tablet Take 2 tablets (500 mg) on day 1, then take 1 tablet (250 mg) on days 2-5 (Patient not taking: Reported on 04/26/2019)   No facility-administered encounter medications on file as of 04/26/2019.      Review of Systems  Review of Systems  Constitutional: Positive for fatigue. Negative for activity change, chills, fever and unexpected weight change.  HENT: Negative for postnasal drip, rhinorrhea, sinus pressure, sinus pain, sneezing and sore throat.   Eyes: Negative.   Respiratory: Positive for cough and shortness of breath. Negative for wheezing.   Cardiovascular: Negative for chest pain and palpitations.  Gastrointestinal: Negative for constipation, diarrhea, nausea and vomiting.  Endocrine: Negative.   Musculoskeletal: Negative.   Skin: Negative.   Neurological: Negative for dizziness and headaches.  Psychiatric/Behavioral: Negative.  Negative for dysphoric mood. The patient is not nervous/anxious.   All other systems reviewed and are negative.    Physical Exam  BP 130/78 (BP Location: Left Arm, Patient Position: Sitting, Cuff Size: Normal)   Pulse 96   Temp 97.9 F (36.6 C)   Ht _0  (1.676 m)   Wt 194 lb 3.2 oz (88.1 kg)   SpO2 95%   BMI 31.34 kg/m   Wt Readings from Last 5 Encounters:  04/26/19 194 lb 3.2 oz (88.1 kg)  10/17/18 189 lb (85.7 kg)  10/06/18 192 lb 12.8 oz (  87.5 kg)  08/01/18 191  lb 9.6 oz (86.9 kg)  12/20/17 196 lb (88.9 kg)     Physical Exam  Constitutional: [name] oriented to person, place, and time and well-developed, well-nourished, and in no distress. No distress.  HENT:  Head: Normocephalic and atraumatic.  Right Ear: Hearing, tympanic membrane, external ear and ear canal normal.  Left Ear: Hearing, tympanic membrane, external ear and ear canal normal.  Nose: Nose normal. Right sinus exhibits no maxillary sinus tenderness and no frontal sinus tenderness. Left sinus exhibits no maxillary sinus tenderness and no frontal sinus tenderness.  Mouth/Throat: Uvula is midline and oropharynx is clear and moist. No oropharyngeal exudate.  Eyes: Pupils are equal, round, and reactive to light.  Neck: Normal range of motion. Neck supple.  Cardiovascular: Normal rate, regular rhythm and normal heart sounds.  Pulmonary/Chest: Effort normal. No accessory muscle usage. No respiratory distress. He has no decreased breath sounds. He has no wheezes. He has no rhonchi. He has rales (Crackles bilaterally, left greater than right).  Abdominal: Soft. Bowel sounds are normal. He exhibits no distension. There is no abdominal tenderness.  Musculoskeletal: Normal range of motion.        General: No edema.  Lymphadenopathy:    He has no cervical adenopathy.  Neurological: [name] alert and oriented to person, place, and time. Gait normal.  Skin: Skin is warm and dry. [name] not diaphoretic. No erythema.  Psychiatric: Mood, memory, affect and judgment normal.  Nursing note and vitals reviewed.     Lab Results:  CBC    Component Value Date/Time   WBC 5.8 04/26/2019 1151   RBC 3.97 (L) 04/26/2019 1151   HGB 12.7 (L) 04/26/2019 1151   HCT 39.1 04/26/2019 1151   PLT 229.0 04/26/2019 1151   MCV 98.4 04/26/2019 1151   MCH 32.2 10/17/2018 1641   MCHC 32.5 04/26/2019 1151   RDW 15.5 04/26/2019 1151   LYMPHSABS 0.5 (L) 04/26/2019 1151   MONOABS 0.5 04/26/2019 1151   EOSABS 0.2  04/26/2019 1151   BASOSABS 0.0 04/26/2019 1151    BMET    Component Value Date/Time   NA 140 04/26/2019 1151   K 4.3 04/26/2019 1151   CL 101 04/26/2019 1151   CO2 31 04/26/2019 1151   GLUCOSE 131 (H) 04/26/2019 1151   BUN 8 04/26/2019 1151   CREATININE 0.89 04/26/2019 1151   CREATININE 1.21 (H) 10/17/2018 1641   CALCIUM 8.8 04/26/2019 1151   GFRNONAA 88 (L) 12/16/2011 0455   GFRAA >90 12/16/2011 0455    BNP No results found for: BNP  ProBNP    Component Value Date/Time   PROBNP 103.7 12/15/2011 1855      Assessment & Plan:   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) Assessment: 2018 CT chest shows interval progression of fibrotic interstitial lung disease with extensive honeycombing, diagnostic of UIP September/2019 spirometry with DLCO shows an FVC of 2.09 (69% predicted), DLCO 52 Patient with known rheumatoid arthritis managed by [place] Patient is managed on Imuran, Rituxan infusions every 6 months, prednisone 3 mg daily Patient has had worsening shortness of breath over the last 3 to 6 months as well as worsened dry cough Crackles bilaterally left greater than right on exam today Patient was able to tolerate walking 3 laps in office today on room air  Plan: High-resolution CT chest to be ordered to see if her having progression from 2018 CT Consider starting anti-fibrotic's if we are seeing  progression as these are now indicated in connective tissue disorder ILD that is progressive Continue Imuran, Rituxan infusion, and prednisone 3 mg daily as managed by rheumatology Continue nocturnal oxygen Continue [name], samples provided today Continue to monitor oxygen saturations at home Follow-up with our office in 4 weeks with a spirometry with DLCO  Rheumatoid arthritis ([place]) Plan: Continue follow-up with rheumatology Continue forward with planned Rituxan infusion in July/2020 Continue Imuran Continue prednisone 3 mg daily We will  reach out to [facility] to see if they can help with cost of dapsone  Nocturnal hypoxemia Plan: Continue 2 L of oxygen at night Continue to check oxygen levels at home sporadically  Medication management Assessment: Patient could not afford dapsone due to $46 monthly co-pay Patient allergic to Bactrim Patient managed on Rituxan, Imuran as well as prednisone, patient needs prophylaxis Patient struggles with affording [name] inhaler  Plan: Discussed case with [facility] pharmacist Jaclyn Shaggy some See telephone note on 04/26/2019 Patient will start using coupon for dapsone which will bring monthly cost under $30 which patient says he can afford Could consider applications for Spiriva to help with cost of LAMA Samples of tudorza today  THN helped patient apply to Extra Help      Return in about 4 weeks (around 05/24/2019), or if symptoms worsen or fail to improve, for After Chest CT, Follow up for PFT.   Lauraine Rinne, NP 04/26/2019   This appointment was 42 minutes long with over 50% of the time in direct face-to-face patient care, assessment, plan of care, and follow-up.

## 2019-04-26 ENCOUNTER — Other Ambulatory Visit: Payer: Self-pay

## 2019-04-26 ENCOUNTER — Other Ambulatory Visit: Payer: Self-pay | Admitting: Pharmacist

## 2019-04-26 ENCOUNTER — Ambulatory Visit (INDEPENDENT_AMBULATORY_CARE_PROVIDER_SITE_OTHER): Payer: Medicare Other | Admitting: Pulmonary Disease

## 2019-04-26 ENCOUNTER — Encounter: Payer: Self-pay | Admitting: Pulmonary Disease

## 2019-04-26 VITALS — BP 130/78 | HR 96 | Temp 97.9°F | Ht 66.0 in | Wt 194.2 lb

## 2019-04-26 DIAGNOSIS — J8410 Pulmonary fibrosis, unspecified: Secondary | ICD-10-CM

## 2019-04-26 DIAGNOSIS — J849 Interstitial pulmonary disease, unspecified: Secondary | ICD-10-CM

## 2019-04-26 DIAGNOSIS — Z5181 Encounter for therapeutic drug level monitoring: Secondary | ICD-10-CM | POA: Diagnosis not present

## 2019-04-26 DIAGNOSIS — G4734 Idiopathic sleep related nonobstructive alveolar hypoventilation: Secondary | ICD-10-CM

## 2019-04-26 DIAGNOSIS — M069 Rheumatoid arthritis, unspecified: Secondary | ICD-10-CM

## 2019-04-26 DIAGNOSIS — Z79899 Other long term (current) drug therapy: Secondary | ICD-10-CM

## 2019-04-26 LAB — COMPREHENSIVE METABOLIC PANEL
ALT: 23 U/L (ref 0–53)
AST: 32 U/L (ref 0–37)
Albumin: 4 g/dL (ref 3.5–5.2)
Alkaline Phosphatase: 42 U/L (ref 39–117)
BUN: 8 mg/dL (ref 6–23)
CO2: 31 mEq/L (ref 19–32)
Calcium: 8.8 mg/dL (ref 8.4–10.5)
Chloride: 101 mEq/L (ref 96–112)
Creatinine, Ser: 0.89 mg/dL (ref 0.40–1.50)
GFR: 101.1 mL/min (ref >60.00)
Glucose, Bld: 131 mg/dL — ABNORMAL HIGH (ref 70–99)
Potassium: 4.3 mEq/L (ref 3.5–5.1)
Sodium: 140 mEq/L (ref 135–145)
Total Bilirubin: 0.5 mg/dL (ref 0.2–1.2)
Total Protein: 8 g/dL (ref 6.0–8.3)

## 2019-04-26 LAB — CBC WITH DIFFERENTIAL/PLATELET
Basophils Absolute: 0 10*3/uL (ref 0.0–0.1)
Basophils Relative: 0.7 % (ref 0.0–3.0)
Eosinophils Absolute: 0.2 10*3/uL (ref 0.0–0.7)
Eosinophils Relative: 2.9 % (ref 0.0–5.0)
HCT: 39.1 % (ref 39.0–52.0)
Hemoglobin: 12.7 g/dL — ABNORMAL LOW (ref 13.0–17.0)
Lymphocytes Relative: 9.2 % — ABNORMAL LOW (ref 12.0–46.0)
Lymphs Abs: 0.5 10*3/uL — ABNORMAL LOW (ref 0.7–4.0)
MCHC: 32.5 g/dL (ref 30.0–36.0)
MCV: 98.4 fl (ref 78.0–100.0)
Monocytes Absolute: 0.5 10*3/uL (ref 0.1–1.0)
Monocytes Relative: 8.3 % (ref 3.0–12.0)
Neutro Abs: 4.6 10*3/uL (ref 1.4–7.7)
Neutrophils Relative %: 78.9 % — ABNORMAL HIGH (ref 43.0–77.0)
Platelets: 229 10*3/uL (ref 150.0–400.0)
RBC: 3.97 Mil/uL — ABNORMAL LOW (ref 4.22–5.81)
RDW: 15.5 % (ref 11.5–15.5)
WBC: 5.8 10*3/uL (ref 4.0–10.5)

## 2019-04-26 LAB — HEPATIC FUNCTION PANEL
ALT: 23 U/L (ref 0–53)
AST: 32 U/L (ref 0–37)
Albumin: 4 g/dL (ref 3.5–5.2)
Alkaline Phosphatase: 42 U/L (ref 39–117)
Bilirubin, Direct: 0.1 mg/dL (ref 0.0–0.3)
Total Bilirubin: 0.5 mg/dL (ref 0.2–1.2)
Total Protein: 8 g/dL (ref 6.0–8.3)

## 2019-04-26 MED ORDER — TUDORZA PRESSAIR 400 MCG/ACT IN AEPB: 1.0000 | INHALATION_SPRAY | Freq: Two times a day (BID) | RESPIRATORY_TRACT | 0 refills | Status: DC

## 2019-04-26 NOTE — Assessment & Plan Note (Signed)
Plan: Continue 2 L of oxygen at night Continue to check oxygen levels at home sporadically

## 2019-04-26 NOTE — Patient Outreach (Addendum)
[name] [place]   04/26/2019  [name] 09-Sep-1946 [id number]  Reason for referral: Medication Assistance with Dapsone  Referral source: Dr. Wyn Quaker Current insurance: [facility]  PMHx includes but not limited to:  ILD due to RA, former smoker 10 pack year history, T2DM, HTN, thyroid disease, arthritis.  Per notes, patient receives samples of [name].   Outreach:  Successful telephone call with [name] and his spouse.  HIPAA identifiers verified.   Subjective:  Patient reports the Dapsone is "too expensive" but knows he should be taking it.  He thinks that the price is ~$40.  [name] agreeable for me to review his medications and call [facility] to confirm pricing.    Objective: The ASCVD Risk score [name], et al., 2013) failed to calculate for the following reasons:   Cannot find a previous HDL lab   Cannot find a previous total cholesterol lab  Lab Results  Component Value Date   CREATININE 0.83 10/31/2018   CREATININE 1.21 (H) 10/17/2018   CREATININE 0.87 12/16/2011    Lab Results  Component Value Date   HGBA1C 7.8 (H) 12/15/2011    Lipid Panel  No results found for: CHOL, TRIG, HDL, CHOLHDL, VLDL, LDLCALC, LDLDIRECT  BP Readings from Last 3 Encounters:  04/26/19 130/78  10/17/18 122/64  10/06/18 132/60    Allergies  Allergen Reactions  . Bactrim [Sulfamethoxazole-Trimethoprim]     Medications Reviewed Today    Reviewed by Spero Curb, CMA (Certified Medical Assistant) on 04/26/19 at 1138  Med List Status: <None>  Medication Order Taking? Sig Documenting Provider Last Dose Status Informant  Aclidinium Bromide ([name] PRESSAIR) 400 MCG/ACT AEPB [id number] Yes Inhale 1 puff into the lungs 2 (two) times daily. Brand Males, MD Taking Active   Aclidinium Bromide ([name] PRESSAIR) 400 MCG/ACT AEPB [id number]  Inhale 1 puff into the lungs 2 (two) times daily for 1 day.  Fenton Foy, NP  Expired 04/11/19 2359   amLODipine (NORVASC) 10 MG tablet [id number] Yes Take 10 mg by mouth daily. [provider] Taking Active Multiple Informants  aspirin 81 MG tablet [id number] Yes Take 81 mg by mouth daily. [provider] Taking Active   atorvastatin (LIPITOR) 20 MG tablet [id number] Yes Take 20 mg by mouth daily. [provider] Taking Active   azaTHIOprine (IMURAN) 50 MG tablet [id number] Yes Take 2 in am and 1 in pm [provider] Taking Active   azithromycin (ZITHROMAX) 250 MG tablet [id number] No Take 2 tablets (500 mg) on day 1, then take 1 tablet (250 mg) on days 2-5  Patient not taking: Reported on 04/26/2019   Fenton Foy, NP Not Taking Active   benzonatate (TESSALON) 200 MG capsule [id number] Yes Take 1 capsule (200 mg total) by mouth 3 (three) times daily as needed for cough. Fenton Foy, NP Taking Active   folic acid (FOLVITE) 799 MCG tablet [id number] Yes Take 400 mcg by mouth daily. [provider] Taking Active Multiple Informants  levothyroxine (SYNTHROID, LEVOTHROID) 25 MCG tablet [id number] Yes Take 25 mcg by mouth daily. [provider] Taking Active Multiple Informants  loratadine (CLARITIN) 10 MG tablet [id number] Yes Take 1 tablet (10 mg total) by mouth daily. Fenton Foy, NP Taking Active   metFORMIN (GLUCOPHAGE-XR) 500 MG 24 hr tablet [id number] Yes Take 1 tablet by mouth 2 (two) times daily. [provider] Taking Active  Med Note Harvest Dark, JENNIFER R   Thu Jan 18, 2014 10:22 AM) Received from: External Pharmacy Received Sig:   NOVOLIN N RELION 100 UNIT/ML injection [id number] Yes 22 Units. 20 units in morning,22 units in evening. [provider] Taking Active            Med Note Harvest Dark, JENNIFER R   Thu Jan 18, 2014 10:22 AM) Received from: External Pharmacy  pantoprazole (PROTONIX) 40 MG tablet [id number] Yes Take 40 mg by mouth daily. [provider]  Taking Active   predniSONE (DELTASONE) 5 MG tablet [id number] Yes Take 5 mg by mouth daily with breakfast. [provider] Taking Active           Assessment: Drugs sorted by system:  Hematologic: aspirin 12m  Cardiovascular: amlodipine, atorvastatin  Pulmonary/Allergy: aclidinium, loratadine  Gastrointestinal: pantoprazole  Endocrine: levothyroxine, metformin,novolin N  Infectious Diseases: dapsone prophylaxis   RA / pulmonary fibrosis / ILD: azathioprine, prednisone, rituxan   Medication Assistance Findings:  Medication assistance needs identified: Dapsone  -Note patient denies needing assistance with insulin or inhaler at this time  Extra Help:  May be eligible for Partial  Extra Help Low Income Subsidy based on reported income and assets  -Assisted patient with applying for Extra Help online  Patient Assistance Programs: No patient assistance programs available for Dapsone at this time.   No grant foundations (PAN or [employer]) open at this time for Dapsone.   Per review of [employer] formularly, co-pay should be $47 / 30 day supply.   -[place]46.25 -[facility] able to use [employer] coupon which lowers cost to $30.38.   -Patient states he can afford this price and will pick up medication tomorrow (not in stock today, has to be ordered).    [name]:  [place] not met 3% household out-of-pocket expenditure yet to qualify.  -Currently using samples from clinic.   -If approved for Extra Help, may be able to afford co-pay.   -Could also consider substitution to alternative LAMA such as Spiriva via [place] apply for patient assistance program    Plan: . Will update Dr. MWarner Mccreedy/ pulmonary regarding Dapsone . Will f/u with patient in 4 weeks regarding Extra Help decision  CRalene Bathe PharmD, BB[phone]

## 2019-04-26 NOTE — Assessment & Plan Note (Addendum)
Assessment: 2018 CT chest shows interval progression of fibrotic interstitial lung disease with extensive honeycombing, diagnostic of UIP September/2019 spirometry with DLCO shows an FVC of 2.09 (69% predicted), DLCO 52 Patient with known rheumatoid arthritis managed by [place] Patient is managed on Imuran, Rituxan infusions every 6 months, prednisone 3 mg daily Patient has had worsening shortness of breath over the last 3 to 6 months as well as worsened dry cough Crackles bilaterally left greater than right on exam today Patient was able to tolerate walking 3 laps in office today on room air  Plan: High-resolution CT chest to be ordered to see if her having progression from 2018 CT Consider starting anti-fibrotic's if we are seeing progression as these are now indicated in connective tissue disorder ILD that is progressive Continue Imuran, Rituxan infusion, and prednisone 3 mg daily as managed by rheumatology Continue nocturnal oxygen Continue [name], samples provided today Continue to monitor oxygen saturations at home Follow-up with our office in 4 weeks with a spirometry with DLCO

## 2019-04-26 NOTE — Assessment & Plan Note (Signed)
Assessment: Patient could not afford dapsone due to $46 monthly co-pay Patient allergic to Bactrim Patient managed on Rituxan, Imuran as well as prednisone, patient needs prophylaxis Patient struggles with affording [name] inhaler  Plan: Discussed case with [facility] pharmacist Jaclyn Shaggy some See telephone note on 04/26/2019 Patient will start using coupon for dapsone which will bring monthly cost under $30 which patient says he can afford Could consider applications for Spiriva to help with cost of LAMA Samples of tudorza today  THN helped patient apply to Extra Help

## 2019-04-26 NOTE — Patient Instructions (Addendum)
Walk in office today   Labwork today   High Res CT Chest Ordered today   Spirometry with DLCO ordered to be completed   Jaci Standard  >>> 1 puff inhaled every 12 hours  >>> do this everyday no matter what  >>>samples provided today   Return in about 4 weeks (around 05/24/2019), or if symptoms worsen or fail to improve, for After Chest CT, Follow up for PFT.   Coronavirus (COVID-19) Are you at risk?  Are you at risk for the Coronavirus (COVID-19)?  To be considered HIGH RISK for Coronavirus (COVID-19), you have to meet the following criteria:  . Traveled to [place], [place], [place], [place] or [place]; or in the [place] to [place], [place], [place], or [place]; and have fever, cough, and shortness of breath within the last 2 weeks of travel OR . Been in close contact with a person diagnosed with COVID-19 within the last 2 weeks and have fever, cough, and shortness of breath . IF YOU DO NOT MEET THESE CRITERIA, YOU ARE CONSIDERED LOW RISK FOR COVID-19.  What to do if you are HIGH RISK for COVID-19?  [name] Kitchen If you are having a medical emergency, call 911. . Seek medical care right away. Before you go to a doctor's office, urgent care or emergency department, call ahead and tell them about your recent travel, contact with someone diagnosed with COVID-19, and your symptoms. You should receive instructions from your physician's office regarding next steps of care.  . When you arrive at healthcare provider, tell the healthcare staff immediately you have returned from visiting [place], [place], [place], [place] or [place]; or traveled in the [place] to [place], [place], [place], or [place]; in the last two weeks or you have been in close contact with a person diagnosed with COVID-19 in the last 2 weeks.   . Tell the health care staff about your symptoms: fever, cough and shortness of breath. . After you have been seen by a medical provider, you will be either: o Tested  for (COVID-19) and discharged home on quarantine except to seek medical care if symptoms worsen, and asked to  - Stay home and avoid contact with others until you get your results (4-5 days)  - Avoid travel on public transportation if possible (such as bus, train, or airplane) or o Sent to the Emergency Department by EMS for evaluation, COVID-19 testing, and possible admission depending on your condition and test results.  What to do if you are LOW RISK for COVID-19?  Reduce your risk of any infection by using the same precautions used for avoiding the common cold or flu:  [name] Kitchen Wash your hands often with soap and warm water for at least 20 seconds.  If soap and water are not readily available, use an alcohol-based hand sanitizer with at least 60% alcohol.  . If coughing or sneezing, cover your mouth and nose by coughing or sneezing into the elbow areas of your shirt or coat, into a tissue or into your sleeve (not your hands). . Avoid shaking hands with others and consider head nods or verbal greetings only. . Avoid touching your eyes, nose, or mouth with unwashed hands.  . Avoid close contact with people who are sick. . Avoid places or events with large numbers of people in one location, like concerts or sporting events. . Carefully consider travel plans you have or are making. . If you are planning any travel outside or inside  the [place], visit the CDC's Travelers' Health webpage for the latest health notices. . If you have some symptoms but not all symptoms, continue to monitor at home and seek medical attention if your symptoms worsen. . If you are having a medical emergency, call 911.   [place] / e-Visit: [email or web address]         [facility]: [facility]: [phone]                   [facility]: [phone]           It is  flu season:   >>> Best ways to protect herself from the flu: Receive the yearly flu vaccine, practice good hand hygiene washing with soap and also using hand sanitizer when available, eat a nutritious meals, get adequate rest, hydrate appropriately   Please contact the office if your symptoms worsen or you have concerns that you are not improving.   Thank you for choosing [facility] for your healthcare, and for allowing [place] to partner with you on your healthcare journey. I am thankful to be able to provide care to you today.   Wyn Quaker FNP-C

## 2019-04-26 NOTE — Assessment & Plan Note (Signed)
Plan: Continue follow-up with rheumatology Continue forward with planned Rituxan infusion in July/2020 Continue Imuran Continue prednisone 3 mg daily We will reach out to [facility] to see if they can help with cost of dapsone

## 2019-04-26 NOTE — Telephone Encounter (Signed)
Dr. Chase Caller  See OV note from today and [facility] note from today. Pt was able to get set up with coupon for dapsome to reduce cost to 30 dollars monthly. Pt says he can afford this. [facility] also to work with patient on Extra [employer] application. Pt to resume dapsome tomm.   Aaron Edelman

## 2019-04-26 NOTE — Progress Notes (Signed)
Lab work stable.  No changes.  Wyn Quaker FNP

## 2019-04-26 NOTE — Addendum Note (Signed)
Addended by: Lauraine Rinne on: 04/26/2019 10:08 PM   Modules accepted: Level of Service

## 2019-05-03 DIAGNOSIS — R0902 Hypoxemia: Secondary | ICD-10-CM | POA: Diagnosis not present

## 2019-05-03 DIAGNOSIS — J129 Viral pneumonia, unspecified: Secondary | ICD-10-CM | POA: Diagnosis not present

## 2019-05-12 ENCOUNTER — Other Ambulatory Visit: Payer: Self-pay

## 2019-05-12 ENCOUNTER — Ambulatory Visit (HOSPITAL_COMMUNITY)
Admission: RE | Admit: 2019-05-12 | Discharge: 2019-05-12 | Disposition: A | Discharge status: Home / Self Care | Payer: Medicare Other | Source: Ambulatory Visit | Attending: Pulmonary Disease | Admitting: Pulmonary Disease

## 2019-05-12 DIAGNOSIS — J8410 Pulmonary fibrosis, unspecified: Secondary | ICD-10-CM | POA: Insufficient documentation

## 2019-05-12 DIAGNOSIS — J849 Interstitial pulmonary disease, unspecified: Secondary | ICD-10-CM | POA: Diagnosis not present

## 2019-05-12 NOTE — Progress Notes (Signed)
Pulmonary fibrosis is relatively stable has advanced some since 2018 scan this will be discussed further at next office visit.  Overall this is to be expected.  No further changes at this time.  Wyn Quaker, FNP

## 2019-05-17 DIAGNOSIS — M0589 Other rheumatoid arthritis with rheumatoid factor of multiple sites: Secondary | ICD-10-CM | POA: Diagnosis not present

## 2019-05-22 ENCOUNTER — Other Ambulatory Visit: Payer: Medicare Other

## 2019-05-22 DIAGNOSIS — R6889 Other general symptoms and signs: Secondary | ICD-10-CM | POA: Diagnosis not present

## 2019-05-22 DIAGNOSIS — Z20822 Contact with and (suspected) exposure to covid-19: Secondary | ICD-10-CM

## 2019-05-23 ENCOUNTER — Emergency Department (HOSPITAL_COMMUNITY)
Admission: EM | Admit: 2019-05-23 | Discharge: 2019-05-24 | Disposition: A | Discharge status: Home / Self Care | Payer: Medicare Other | Attending: Emergency Medicine | Admitting: Emergency Medicine

## 2019-05-23 ENCOUNTER — Encounter (HOSPITAL_COMMUNITY): Payer: Self-pay

## 2019-05-23 ENCOUNTER — Emergency Department (HOSPITAL_COMMUNITY): Payer: Medicare Other

## 2019-05-23 ENCOUNTER — Other Ambulatory Visit: Payer: Self-pay

## 2019-05-23 DIAGNOSIS — E119 Type 2 diabetes mellitus without complications: Secondary | ICD-10-CM | POA: Diagnosis not present

## 2019-05-23 DIAGNOSIS — I2510 Atherosclerotic heart disease of native coronary artery without angina pectoris: Secondary | ICD-10-CM | POA: Diagnosis not present

## 2019-05-23 DIAGNOSIS — Z7984 Long term (current) use of oral hypoglycemic drugs: Secondary | ICD-10-CM | POA: Insufficient documentation

## 2019-05-23 DIAGNOSIS — Z20828 Contact with and (suspected) exposure to other viral communicable diseases: Secondary | ICD-10-CM | POA: Insufficient documentation

## 2019-05-23 DIAGNOSIS — A419 Sepsis, unspecified organism: Secondary | ICD-10-CM

## 2019-05-23 DIAGNOSIS — J849 Interstitial pulmonary disease, unspecified: Secondary | ICD-10-CM

## 2019-05-23 DIAGNOSIS — Z7982 Long term (current) use of aspirin: Secondary | ICD-10-CM | POA: Insufficient documentation

## 2019-05-23 DIAGNOSIS — J189 Pneumonia, unspecified organism: Secondary | ICD-10-CM | POA: Diagnosis not present

## 2019-05-23 DIAGNOSIS — Z79899 Other long term (current) drug therapy: Secondary | ICD-10-CM | POA: Diagnosis not present

## 2019-05-23 DIAGNOSIS — Z87891 Personal history of nicotine dependence: Secondary | ICD-10-CM | POA: Insufficient documentation

## 2019-05-23 DIAGNOSIS — R05 Cough: Secondary | ICD-10-CM | POA: Diagnosis not present

## 2019-05-23 DIAGNOSIS — E039 Hypothyroidism, unspecified: Secondary | ICD-10-CM | POA: Insufficient documentation

## 2019-05-23 DIAGNOSIS — J069 Acute upper respiratory infection, unspecified: Secondary | ICD-10-CM | POA: Insufficient documentation

## 2019-05-23 DIAGNOSIS — I10 Essential (primary) hypertension: Secondary | ICD-10-CM | POA: Insufficient documentation

## 2019-05-23 DIAGNOSIS — R0602 Shortness of breath: Secondary | ICD-10-CM | POA: Diagnosis present

## 2019-05-23 DIAGNOSIS — E860 Dehydration: Secondary | ICD-10-CM | POA: Diagnosis not present

## 2019-05-23 LAB — CBC WITH DIFFERENTIAL/PLATELET
Abs Immature Granulocytes: 0.03 10*3/uL (ref 0.00–0.07)
Basophils Absolute: 0 10*3/uL (ref 0.0–0.1)
Basophils Relative: 1 %
Eosinophils Absolute: 0.3 10*3/uL (ref 0.0–0.5)
Eosinophils Relative: 5 %
HCT: 34.1 % — ABNORMAL LOW (ref 39.0–52.0)
Hemoglobin: 10.5 g/dL — ABNORMAL LOW (ref 13.0–17.0)
Immature Granulocytes: 1 %
Lymphocytes Relative: 11 %
Lymphs Abs: 0.6 10*3/uL — ABNORMAL LOW (ref 0.7–4.0)
MCH: 30.7 pg (ref 26.0–34.0)
MCHC: 30.8 g/dL (ref 30.0–36.0)
MCV: 99.7 fL (ref 80.0–100.0)
Monocytes Absolute: 0.4 10*3/uL (ref 0.1–1.0)
Monocytes Relative: 7 %
Neutro Abs: 3.7 10*3/uL (ref 1.7–7.7)
Neutrophils Relative %: 75 %
Platelets: 168 10*3/uL (ref 150–400)
RBC: 3.42 MIL/uL — ABNORMAL LOW (ref 4.22–5.81)
RDW: 17.3 % — ABNORMAL HIGH (ref 11.5–15.5)
WBC: 5 10*3/uL (ref 4.0–10.5)
nRBC: 0 % (ref 0.0–0.2)

## 2019-05-23 LAB — COMPREHENSIVE METABOLIC PANEL
ALT: 23 U/L (ref 0–44)
AST: 36 U/L (ref 15–41)
Albumin: 3.3 g/dL — ABNORMAL LOW (ref 3.5–5.0)
Alkaline Phosphatase: 28 U/L — ABNORMAL LOW (ref 38–126)
Anion gap: 9 (ref 5–15)
BUN: 14 mg/dL (ref 8–23)
CO2: 26 mmol/L (ref 22–32)
Calcium: 7.9 mg/dL — ABNORMAL LOW (ref 8.9–10.3)
Chloride: 105 mmol/L (ref 98–111)
Creatinine, Ser: 1.26 mg/dL — ABNORMAL HIGH (ref 0.61–1.24)
GFR calc Af Amer: >60 mL/min (ref >60)
GFR calc non Af Amer: 56 mL/min — ABNORMAL LOW (ref >60)
Glucose, Bld: 123 mg/dL — ABNORMAL HIGH (ref 70–99)
Potassium: 3.8 mmol/L (ref 3.5–5.1)
Sodium: 140 mmol/L (ref 135–145)
Total Bilirubin: 1.2 mg/dL (ref 0.3–1.2)
Total Protein: 7.1 g/dL (ref 6.5–8.1)

## 2019-05-23 LAB — PROTIME-INR
INR: 1.2 (ref 0.8–1.2)
Prothrombin Time: 14.8 seconds (ref 11.4–15.2)

## 2019-05-23 LAB — LACTIC ACID, PLASMA
Lactic Acid, Venous: 1.3 mmol/L (ref 0.5–1.9)
Lactic Acid, Venous: 0.8 mmol/L (ref 0.5–1.9)

## 2019-05-23 LAB — SARS CORONAVIRUS 2 BY RT PCR (HOSPITAL ORDER, PERFORMED IN ~~LOC~~ HOSPITAL LAB): SARS Coronavirus 2: NEGATIVE

## 2019-05-23 MED ORDER — SODIUM CHLORIDE 0.9 % IV SOLN
1.0000 g | Freq: Once | INTRAVENOUS | Status: AC
  Administered 2019-05-23: 1 g via INTRAVENOUS
  Filled 2019-05-23: qty 10

## 2019-05-23 MED ORDER — SODIUM CHLORIDE 0.9 % IV SOLN
500.0000 mg | Freq: Once | INTRAVENOUS | Status: AC
  Administered 2019-05-23: 500 mg via INTRAVENOUS
  Filled 2019-05-23: qty 500

## 2019-05-23 MED ORDER — AZITHROMYCIN 250 MG PO TABS: 250.0000 mg | ORAL_TABLET | Freq: Every day | ORAL | 0 refills | Status: DC

## 2019-05-23 MED ORDER — ACETAMINOPHEN 325 MG PO TABS
650.0000 mg | ORAL_TABLET | Freq: Once | ORAL | Status: AC | PRN
  Administered 2019-05-23: 650 mg via ORAL
  Filled 2019-05-23: qty 2

## 2019-05-23 MED ORDER — PREDNISONE 20 MG PO TABS: 40.0000 mg | ORAL_TABLET | Freq: Every day | ORAL | 0 refills | Status: DC

## 2019-05-23 NOTE — ED Triage Notes (Signed)
Pt has had a fever for 2 days. Tested at site yesterday. Temp as high as 101 Pt reports SOB and fever. Pt has chronic cough. Reports that he normally on has to wear O2 at night

## 2019-05-23 NOTE — ED Notes (Signed)
Pt was informed that we need a urine sample. Pt has urinal at bedside.

## 2019-05-23 NOTE — Discharge Instructions (Signed)
Take the steroids for 4 days and take the azithromycin starting tomorrow.  Follow-up with your doctor or pulmonology as needed.  Return for worsening symptoms more difficulty breathing.

## 2019-05-23 NOTE — ED Provider Notes (Addendum)
[facility] EMERGENCY DEPARTMENT Provider Note   CSN: [id number] Arrival date & time: 05/23/19  1056    History   Chief Complaint Chief Complaint  Patient presents with  . Fever    HPI [name] is a 73 y.o. male.     HPI Patient presents with a fever.  History of chronic lung disease.  States her last 4 days she had a cough shortness of breath and fever.  Had outpatient COVID test yesterday results not back.  Wears oxygen at night.  States he feels more fatigued.  No nausea or vomiting.  No diarrhea.  No dysuria.  Mild sputum production. Past Medical History:  Diagnosis Date  . Arthritis   . Diabetes mellitus   . Hypertension   . Pneumonia   . Thyroid disease     Patient Active Problem List   Diagnosis Date Noted  . Nocturnal hypoxemia 04/26/2019  . Medication management 04/26/2019  . Drug reaction 10/18/2018  . Vomiting without nausea 10/18/2018  . Wheezing on auscultation 01/01/2017  . Nodule of right lung 07/29/2015  . Coronary artery calcification 02/11/2014  . Caries 01/18/2014  . Chronic cough 07/01/2013  . Obesity (BMI 30.0-34.9) 07/26/2012  . 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 Pneumonia 12/15/2011  . HTN (hypertension) 12/15/2011  . Rheumatoid arthritis ([place]) 12/15/2011  . Hypothyroidism 12/15/2011    Past Surgical History:  Procedure Laterality Date  . KNEE ARTHROSCOPY    . NO PAST SURGERIES          Home Medications    Prior to Admission medications   Medication Sig Start Date End Date Taking? Authorizing Provider  Aclidinium Bromide ([name] PRESSAIR) 400 MCG/ACT AEPB Inhale 1 puff into the lungs 2 (two) times a day. 04/26/19   Lauraine Rinne, NP  amLODipine (NORVASC) 10 MG tablet Take 10 mg by mouth daily.    [provider]  aspirin 81 MG tablet Take 81 mg by mouth daily.    [provider]  atorvastatin (LIPITOR) 20 MG tablet Take 20 mg by mouth daily. 08/19/18   [provider]   azaTHIOprine (IMURAN) 50 MG tablet Take 2 in am and 1 in pm    [provider]  azithromycin (ZITHROMAX) 250 MG tablet Take 1 tablet (250 mg total) by mouth daily. 05/24/19   Davonna Belling, MD  dapsone 100 MG tablet Take 100 mg by mouth daily.    [provider]  folic acid (FOLVITE) 696 MCG tablet Take 400 mcg by mouth daily.    [provider]  levothyroxine (SYNTHROID, LEVOTHROID) 25 MCG tablet Take 25 mcg by mouth daily.    [provider]  loratadine (CLARITIN) 10 MG tablet Take 1 tablet (10 mg total) by mouth daily. 03/22/19   Fenton Foy, NP  metFORMIN (GLUCOPHAGE-XR) 500 MG 24 hr tablet Take 1 tablet by mouth 2 (two) times daily. 11/02/13   [provider]  NOVOLIN N RELION 100 UNIT/ML injection 22 Units. 20 units in morning,22 units in evening. 11/27/13   [provider]  pantoprazole (PROTONIX) 40 MG tablet Take 40 mg by mouth daily. 09/19/18   [provider]  predniSONE (DELTASONE) 20 MG tablet Take 2 tablets (40 mg total) by mouth daily. 05/23/19   Davonna Belling, MD    Family History Family History  Problem Relation Age of Onset  . Diabetes Mother   . Stroke Mother     Social History Social History  Tobacco Use  . Smoking status: Former Smoker    Packs/day: 0.50    Years: 10.00    Pack years: 5.00    Types: Cigarettes    Quit date: 12/03/1984    Years since quitting: 34.4  . Smokeless tobacco: Never Used  Substance Use Topics  . Alcohol use: No  . Drug use: No     Allergies   Bactrim [sulfamethoxazole-trimethoprim]   Review of Systems Review of Systems  Constitutional: Positive for fever. Negative for appetite change.  HENT: Negative for congestion.   Respiratory: Positive for cough and shortness of breath.   Cardiovascular: Negative for chest pain.  Gastrointestinal: Negative for abdominal pain.  Genitourinary: Negative for frequency.  Musculoskeletal: Negative for back pain.  Skin:  Negative for rash.  Neurological: Negative for weakness.  Psychiatric/Behavioral: Negative for confusion.     Physical Exam Updated Vital Signs BP 127/67   Pulse 95   Temp 99.3 F (37.4 C) (Oral)   Resp (!) 24   Ht _0  (1.676 m)   Wt 87.5 kg   SpO2 96%   BMI 31.15 kg/m   Physical Exam Vitals signs and nursing note reviewed.  HENT:     Head: Atraumatic.  Cardiovascular:     Rate and Rhythm: Tachycardia present.  Pulmonary:     Comments: Tachypnea.  Somewhat harsh breath sounds. Abdominal:     Tenderness: There is no abdominal tenderness.  Musculoskeletal:     Right lower leg: No edema.     Left lower leg: No edema.  Skin:    General: Skin is warm.     Capillary Refill: Capillary refill takes less than 2 seconds.  Neurological:     Mental Status: [name] alert and oriented to person, place, and time.      ED Treatments / Results  Labs (all labs ordered are listed, but only abnormal results are displayed) Labs Reviewed  COMPREHENSIVE METABOLIC PANEL - Abnormal; Notable for the following components:      Result Value   Glucose, Bld 123 (*)    Creatinine, Ser 1.26 (*)    Calcium 7.9 (*)    Albumin 3.3 (*)    Alkaline Phosphatase 28 (*)    GFR calc non Af Amer 56 (*)    All other components within normal limits  CBC WITH DIFFERENTIAL/PLATELET - Abnormal; Notable for the following components:   RBC 3.42 (*)    Hemoglobin 10.5 (*)    HCT 34.1 (*)    RDW 17.3 (*)    Lymphs Abs 0.6 (*)    All other components within normal limits  SARS CORONAVIRUS 2 (HOSPITAL ORDER, [place] [facility])  CULTURE, BLOOD (ROUTINE X 2)  CULTURE, BLOOD (ROUTINE X 2)  LACTIC ACID, PLASMA  LACTIC ACID, PLASMA  PROTIME-INR  URINALYSIS, ROUTINE W REFLEX MICROSCOPIC    EKG None  Radiology Dg Chest Port 1 View  Result Date: 05/23/2019 CLINICAL DATA:  Sepsis, fever EXAM: PORTABLE CHEST 1 VIEW COMPARISON:  Chest CT 05/12/2019 FINDINGS: Severe chronic lung  disease/fibrosis within the lungs. No visible acute opacity or effusion. Heart is upper limits normal in size. No acute bony abnormality. IMPRESSION: Severe chronic lung disease/fibrosis.  No definite acute process. Electronically Signed   By: Rolm Baptise [name]   On: 05/23/2019 12:20    Procedures Procedures (including critical care time)  Medications Ordered in ED Medications  cefTRIAXone (ROCEPHIN) 1 g in sodium chloride 0.9 % 100 mL IVPB (1 g Intravenous  New Bag/Given 05/23/19 1343)  azithromycin (ZITHROMAX) 500 mg in sodium chloride 0.9 % 250 mL IVPB (500 mg Intravenous New Bag/Given 05/23/19 1342)  acetaminophen (TYLENOL) tablet 650 mg (650 mg Oral Given 05/23/19 1109)     Initial Impression / Assessment and Plan / ED Course  I have reviewed the triage vital signs and the nursing notes.  Pertinent labs & imaging results that were available during my care of the patient were reviewed by me and considered in my medical decision making (see chart for details).        Patient with cough and fever.  Negative COVID test.  X-ray shows chronic fibrosis.  Worsening dyspnea.  Also patient would benefit from [place] the hospital.  Will do IV antibiotics.  Admit to hospitalist.  Discussed with Dr. Manuella Ghazi from the hospitalist.  After further discussion I think patient likely be good for discharge.  X-ray does not show infiltrate.  However with lung issues will treat with antibiotics at home will do azithromycin.  Not hypoxic on room air and does have oxygen at home.  Will increase steroids for 4 days.  Follow-up as an outpatient.  Return for worsening symptoms.  Negative cover test.  Final Clinical Impressions(s) / ED Diagnoses   Final diagnoses:  Pneumonia due to infectious organism, unspecified laterality, unspecified part of lung  Upper respiratory tract infection, unspecified type    ED Discharge Orders         Ordered    azithromycin (ZITHROMAX) 250 MG tablet  Daily     05/23/19 1402     predniSONE (DELTASONE) 20 MG tablet  Daily     05/23/19 1402           Davonna Belling, MD 05/23/19 1327    Davonna Belling, MD 05/23/19 1404

## 2019-05-23 NOTE — Progress Notes (Signed)
Discussed case with Dr. Alvino Chapel after examining patient in the ED. no respiratory distress and is not currently on oxygen with saturations between 94-96%.  His fever has improved.  I feel that this may be related to a mild ILD flare which can be managed at home with steroid taper as well as azithromycin which can be prescribed to cover any potential atypical infection.  COVID testing negative.  Recommend close follow-up to his PCP/pulmonology group for continued follow-up.  Patient currently would not benefit from hospitalization, nor does he need observation for any acute concerns.

## 2019-05-25 LAB — NOVEL CORONAVIRUS, NAA: SARS-CoV-2, NAA: NOT DETECTED

## 2019-05-26 ENCOUNTER — Emergency Department (HOSPITAL_COMMUNITY): Payer: Medicare Other

## 2019-05-26 ENCOUNTER — Telehealth: Payer: Self-pay

## 2019-05-26 ENCOUNTER — Ambulatory Visit: Payer: Medicare Other | Admitting: Pulmonary Disease

## 2019-05-26 ENCOUNTER — Encounter (HOSPITAL_COMMUNITY): Payer: Self-pay | Admitting: Emergency Medicine

## 2019-05-26 ENCOUNTER — Other Ambulatory Visit: Payer: Self-pay

## 2019-05-26 ENCOUNTER — Emergency Department (HOSPITAL_COMMUNITY)
Admission: EM | Admit: 2019-05-26 | Discharge: 2019-05-26 | Disposition: A | Discharge status: Home / Self Care | Payer: Medicare Other | Attending: Emergency Medicine | Admitting: Emergency Medicine

## 2019-05-26 DIAGNOSIS — J8410 Pulmonary fibrosis, unspecified: Secondary | ICD-10-CM

## 2019-05-26 DIAGNOSIS — J479 Bronchiectasis, uncomplicated: Secondary | ICD-10-CM | POA: Diagnosis not present

## 2019-05-26 DIAGNOSIS — I10 Essential (primary) hypertension: Secondary | ICD-10-CM | POA: Diagnosis not present

## 2019-05-26 DIAGNOSIS — Z7189 Other specified counseling: Secondary | ICD-10-CM | POA: Diagnosis not present

## 2019-05-26 DIAGNOSIS — Z79899 Other long term (current) drug therapy: Secondary | ICD-10-CM | POA: Diagnosis not present

## 2019-05-26 DIAGNOSIS — R1030 Lower abdominal pain, unspecified: Secondary | ICD-10-CM | POA: Diagnosis not present

## 2019-05-26 DIAGNOSIS — R05 Cough: Secondary | ICD-10-CM | POA: Diagnosis not present

## 2019-05-26 DIAGNOSIS — R1013 Epigastric pain: Secondary | ICD-10-CM | POA: Insufficient documentation

## 2019-05-26 DIAGNOSIS — R0602 Shortness of breath: Secondary | ICD-10-CM | POA: Diagnosis present

## 2019-05-26 DIAGNOSIS — Z20828 Contact with and (suspected) exposure to other viral communicable diseases: Secondary | ICD-10-CM | POA: Insufficient documentation

## 2019-05-26 DIAGNOSIS — Z882 Allergy status to sulfonamides status: Secondary | ICD-10-CM | POA: Insufficient documentation

## 2019-05-26 DIAGNOSIS — N281 Cyst of kidney, acquired: Secondary | ICD-10-CM | POA: Diagnosis not present

## 2019-05-26 DIAGNOSIS — Z7984 Long term (current) use of oral hypoglycemic drugs: Secondary | ICD-10-CM | POA: Diagnosis not present

## 2019-05-26 DIAGNOSIS — E039 Hypothyroidism, unspecified: Secondary | ICD-10-CM | POA: Insufficient documentation

## 2019-05-26 DIAGNOSIS — E119 Type 2 diabetes mellitus without complications: Secondary | ICD-10-CM | POA: Diagnosis not present

## 2019-05-26 DIAGNOSIS — R1084 Generalized abdominal pain: Secondary | ICD-10-CM | POA: Diagnosis not present

## 2019-05-26 DIAGNOSIS — J9601 Acute respiratory failure with hypoxia: Secondary | ICD-10-CM | POA: Diagnosis not present

## 2019-05-26 DIAGNOSIS — M069 Rheumatoid arthritis, unspecified: Secondary | ICD-10-CM | POA: Diagnosis not present

## 2019-05-26 DIAGNOSIS — R0689 Other abnormalities of breathing: Secondary | ICD-10-CM | POA: Diagnosis not present

## 2019-05-26 DIAGNOSIS — Z87891 Personal history of nicotine dependence: Secondary | ICD-10-CM | POA: Diagnosis not present

## 2019-05-26 DIAGNOSIS — E1065 Type 1 diabetes mellitus with hyperglycemia: Secondary | ICD-10-CM | POA: Diagnosis not present

## 2019-05-26 DIAGNOSIS — K5730 Diverticulosis of large intestine without perforation or abscess without bleeding: Secondary | ICD-10-CM | POA: Diagnosis not present

## 2019-05-26 LAB — COMPREHENSIVE METABOLIC PANEL
ALT: 24 U/L (ref 0–44)
AST: 37 U/L (ref 15–41)
Albumin: 3.1 g/dL — ABNORMAL LOW (ref 3.5–5.0)
Alkaline Phosphatase: 25 U/L — ABNORMAL LOW (ref 38–126)
Anion gap: 7 (ref 5–15)
BUN: 12 mg/dL (ref 8–23)
CO2: 26 mmol/L (ref 22–32)
Calcium: 7.8 mg/dL — ABNORMAL LOW (ref 8.9–10.3)
Chloride: 107 mmol/L (ref 98–111)
Creatinine, Ser: 1.12 mg/dL (ref 0.61–1.24)
GFR calc Af Amer: >60 mL/min (ref >60)
GFR calc non Af Amer: >60 mL/min (ref >60)
Glucose, Bld: 162 mg/dL — ABNORMAL HIGH (ref 70–99)
Potassium: 4 mmol/L (ref 3.5–5.1)
Sodium: 140 mmol/L (ref 135–145)
Total Bilirubin: 1 mg/dL (ref 0.3–1.2)
Total Protein: 6.8 g/dL (ref 6.5–8.1)

## 2019-05-26 LAB — SARS CORONAVIRUS 2 BY RT PCR (HOSPITAL ORDER, PERFORMED IN ~~LOC~~ HOSPITAL LAB): SARS Coronavirus 2: NEGATIVE

## 2019-05-26 LAB — LACTIC ACID, PLASMA: Lactic Acid, Venous: 1.2 mmol/L (ref 0.5–1.9)

## 2019-05-26 LAB — BRAIN NATRIURETIC PEPTIDE: B Natriuretic Peptide: 43.6 pg/mL (ref 0.0–100.0)

## 2019-05-26 MED ORDER — PREDNISONE 20 MG PO TABS: 40.0000 mg | ORAL_TABLET | Freq: Every day | ORAL | 0 refills | Status: DC

## 2019-05-26 MED ORDER — METHYLPREDNISOLONE SODIUM SUCC 125 MG IJ SOLR
125.0000 mg | Freq: Once | INTRAMUSCULAR | Status: AC
  Administered 2019-05-26: 125 mg via INTRAVENOUS
  Filled 2019-05-26: qty 2

## 2019-05-26 MED ORDER — LEVOFLOXACIN IN D5W 500 MG/100ML IV SOLN
500.0000 mg | Freq: Once | INTRAVENOUS | Status: AC
  Administered 2019-05-26: 500 mg via INTRAVENOUS
  Filled 2019-05-26: qty 100

## 2019-05-26 MED ORDER — SODIUM CHLORIDE (PF) 0.9 % IJ SOLN
INTRAMUSCULAR | Status: AC
  Filled 2019-05-26: qty 50

## 2019-05-26 MED ORDER — IOHEXOL 300 MG/ML  SOLN
100.0000 mL | Freq: Once | INTRAMUSCULAR | Status: AC | PRN
  Administered 2019-05-26: 100 mL via INTRAVENOUS

## 2019-05-26 NOTE — Discharge Instructions (Signed)
1) you will need to start wearing your oxygen all the time during the day and night. 2) I am ordering prednisone for your breathing. DO NOT START TAKING THIS MEDICINE UNTIL TOMORROW! 3) You will need to see your pulmonologist or his assistant on Monday or Tuesday of this coming week.  Get help right away if you: Have a sudden worsening of your symptoms. Have chest pain. Cough up mucus that is dark in color. Have a lot of headaches. Get very confused or sleepy.

## 2019-05-26 NOTE — ED Provider Notes (Signed)
[facility] DEPT Provider Note   CSN: [id number] Arrival date & time: 05/26/19  1106     History   Chief Complaint Chief Complaint  Patient presents with  . Abdominal Pain  . Shortness of Breath  . Cough    HPI [name] is a 73 y.o. male with a pmh of Pulmonary Fibrosis/ILD and RA who presents with cc of shortness of breath.  [name] immunocompromise and on Rituxan and Imuran for treatment.  Patient is a very poor historian which limits the history.  Patient states that today "my breathing went bad on me."  He states he is having very difficult time breathing.  He also complains of pain across the center of his abdomen.  [name] unable to say if anything makes his pain in his belly worse or better.  He does use oxygen at home but states he only uses it at night.  He states that his temperature "goes up and down."  He denies any abdominal distention or swelling, new swelling in his lower extremities.  He denies other symptoms such as urinary urgency, frequency, dysuria.  He denies nausea, vomiting, diarrhea, constipation.     HPI  Past Medical History:  Diagnosis Date  . Arthritis   . Diabetes mellitus   . Hypertension   . Pneumonia   . Thyroid disease     Patient Active Problem List   Diagnosis Date Noted  . Nocturnal hypoxemia 04/26/2019  . Medication management 04/26/2019  . Drug reaction 10/18/2018  . Vomiting without nausea 10/18/2018  . Wheezing on auscultation 01/01/2017  . Nodule of right lung 07/29/2015  . Coronary artery calcification 02/11/2014  . Caries 01/18/2014  . Chronic cough 07/01/2013  . Obesity (BMI 30.0-34.9) 07/26/2012  . 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 Pneumonia 12/15/2011  . HTN (hypertension) 12/15/2011  . Rheumatoid arthritis ([place]) 12/15/2011  . Hypothyroidism 12/15/2011    Past Surgical History:  Procedure Laterality Date  . KNEE ARTHROSCOPY    . NO PAST SURGERIES          Home Medications    Prior to Admission medications   Medication Sig Start Date End Date Taking? Authorizing Provider  Aclidinium Bromide ([name] PRESSAIR) 400 MCG/ACT AEPB Inhale 1 puff into the lungs 2 (two) times a day. 04/26/19   Lauraine Rinne, NP  amLODipine (NORVASC) 10 MG tablet Take 10 mg by mouth daily.    [provider]  aspirin 81 MG tablet Take 81 mg by mouth daily.    [provider]  atorvastatin (LIPITOR) 20 MG tablet Take 20 mg by mouth daily. 08/19/18   [provider]  azaTHIOprine (IMURAN) 50 MG tablet Take 2 in am and 1 in pm    [provider]  azithromycin (ZITHROMAX) 250 MG tablet Take 1 tablet (250 mg total) by mouth daily. 05/24/19   Davonna Belling, MD  dapsone 100 MG tablet Take 100 mg by mouth daily.    [provider]  folic acid (FOLVITE) 790 MCG tablet Take 400 mcg by mouth daily.    [provider]  levothyroxine (SYNTHROID, LEVOTHROID) 25 MCG tablet Take 25 mcg by mouth daily.    [provider]  loratadine (CLARITIN) 10 MG tablet Take 1 tablet (10 mg total) by mouth daily. 03/22/19   Fenton Foy, NP  metFORMIN (GLUCOPHAGE-XR) 500 MG 24 hr tablet Take 1 tablet by mouth 2 (two) times daily. 11/02/13  [provider]  NOVOLIN N RELION 100 UNIT/ML injection 22 Units. 20 units in morning,22 units in evening. 11/27/13   [provider]  pantoprazole (PROTONIX) 40 MG tablet Take 40 mg by mouth daily. 09/19/18   [provider]  predniSONE (DELTASONE) 20 MG tablet Take 2 tablets (40 mg total) by mouth daily. 05/23/19   Davonna Belling, MD    Family History Family History  Problem Relation Age of Onset  . Diabetes Mother   . Stroke Mother     Social History Social History   Tobacco Use  . Smoking status: Former Smoker    Packs/day: 0.50    Years: 10.00    Pack years: 5.00    Types: Cigarettes    Quit date: 12/03/1984    Years since quitting: 34.4  . Smokeless  tobacco: Never Used  Substance Use Topics  . Alcohol use: No  . Drug use: No     Allergies   Bactrim [sulfamethoxazole-trimethoprim]   Review of Systems Review of Systems  Ten systems reviewed and are negative for acute change, except as noted in the HPI.   Physical Exam Updated Vital Signs BP 118/65   Pulse 92   Temp 100 F (37.8 C) (Rectal)   Resp (!) 22   Ht _0  (1.676 m)   Wt 87.5 kg   SpO2 94%   BMI 31.15 kg/m   Physical Exam Vitals signs and nursing note reviewed.  Constitutional:      General: [name] not in acute distress.    Appearance: [name] well-developed. [name] obese. [name] not diaphoretic.  HENT:     Head: Normocephalic and atraumatic.  Eyes:     General: No scleral icterus.    Conjunctiva/sclera: Conjunctivae normal.  Neck:     Musculoskeletal: Normal range of motion and neck supple.  Cardiovascular:     Rate and Rhythm: Normal rate and regular rhythm.     Heart sounds: Normal heart sounds.  Pulmonary:     Effort: Pulmonary effort is normal. No respiratory distress.     Breath sounds: Rhonchi present.     Comments: Harsh, rattling breath sounds that improve with cough Abdominal:     General: There is distension.     Palpations: Abdomen is soft.     Tenderness: There is abdominal tenderness in the right upper quadrant, epigastric area, periumbilical area and left upper quadrant.  Skin:    General: Skin is warm and dry.  Neurological:     Mental Status: [name] alert.  Psychiatric:        Behavior: Behavior normal.       ED Treatments / Results  Labs (all labs ordered are listed, but only abnormal results are displayed) Labs Reviewed  SARS CORONAVIRUS 2 (HOSPITAL ORDER, [place] LAB)  BASIC METABOLIC PANEL  BRAIN NATRIURETIC PEPTIDE  LACTIC ACID, PLASMA    EKG EKG Interpretation  Date/Time:  Friday May 26 2019 11:23:25 EDT Ventricular Rate:  98 PR Interval:    QRS Duration: 97 QT Interval:  366 QTC  Calculation: 468 R Axis:   126 Text Interpretation:  Sinus rhythm Left posterior fascicular block Minimal ST elevation, inferior leads , new since last tracing Confirmed by Daleen Bo [phone]) on 05/26/2019 12:23:22 PM   Radiology No results found.  Procedures Procedures (including critical care time)  Medications Ordered in ED Medications - No data to display   Initial Impression / Assessment and Plan / ED Course  I have reviewed the triage vital signs and the nursing notes.  Pertinent labs & imaging results that were available during my care of the patient were reviewed by me and considered in my medical decision making (see chart for details).        73 year old male with history of interstitial lung disease, he has oxygen supplementation at home but generally only uses it at night.  The patient had borderline fever here at 100 F rectally.  He was given Tylenol and began to break the cold sweat as if he was breaking fever.  He has been here a couple times over the past few weeks complaining of the same complaint.  [name] also been following up in the pulmonology complaint clinic with complaint of worsening breathing.  Patient had a chest x-ray which showed no acute abnormalities.  I personally reviewed the images and agree with radiologic interpretation.  [name] also complaining of abdominal pain.  His labs show no significant abnormality except are elevated blood glucose.  This may be secondary to recent steroid use.  BMP is without abnormality, normal lactic acid, negative corona test.  I discussed the case with Dr. Eulis Foster and decided to proceed and shared plan with CT with contrast of the chest and abdomen and pelvis which shows stable interstitial lung disease and thyroid abnormalities which have already been assessed and are unchanged.  I have discussed the case with Dr. Tamala Julian who is on for pulmonary and critical care.  He advised giving the patient a dose of steroid, giving him  outpatient steroid burst for 1 week and having him be on his home some oxygen supplementation at all times and follow in the clinic this coming Monday or Tuesday.  I have discussed this with the patient who appears appropriate for discharge.  He will be discharged when his Solu-Medrol is complete.  Final Clinical Impressions(s) / ED Diagnoses   Final diagnoses:  None    ED Discharge Orders    None       Margarita Mail, PA-C 05/26/19 1800    Daleen Bo, MD 06/01/19 [phone]

## 2019-05-26 NOTE — Telephone Encounter (Signed)
Call made to patient, he states he is still in the hospital and does not know when he will be discharged. I made patient aware we will send the message to triage and hold until his d/c. Voiced understanding.

## 2019-05-26 NOTE — Progress Notes (Signed)
Called regarding this patient.  Insidiously worsening SOB over past month or so.  CT shows stable fibrotic NSIP.  Some URI symptoms and mildly worse hypoxemia.  Would have him wear his home O2 and give him 7 day course of 22m prednisone.  I will notify our clinic and have him seen early next week.  Please call if we can be of any further assistance.  DErskine EmeryMD Pulmonology

## 2019-05-26 NOTE — ED Notes (Signed)
Patient completed the discharge process. [name] to remain on O2. [name] currently waiting on his ride in his room. His ride will bring his oxygen.

## 2019-05-26 NOTE — ED Provider Notes (Signed)
  Face-to-face evaluation   History: Here for cough and SOB. Recently treated for infection with ABX. He arrived hypoxic.   Physical exam: Alert elderly man who is not in respiratory distress.  [name] currently on oxygen by nasal cannula.  Patient is very diaphoretic at this time, 2:25 PM.  Lungs have generalized rhonchi.  There are no rales.  There is no increased work of breathing.  Abdomen is soft there is mild diffuse upper abdominal tenderness.  Medical screening examination/treatment/procedure(s) were conducted as a shared visit with non-physician practitioner(s) and myself.  I personally evaluated the patient during the encounter    Daleen Bo, MD 06/01/19 1941

## 2019-05-26 NOTE — Telephone Encounter (Signed)
-----  Message from Candee Furbish, MD sent at 05/26/2019  4:14 PM EDT ----- Regarding: f/u early next week Followup early next week: recurrent ER visits for SOB, probably related to slowly progressing ILD, may need long steroid taper and/or walking pulse ox.  Thanks, Erskine Emery

## 2019-05-26 NOTE — ED Triage Notes (Signed)
The patient is from home and was sent over from the MD office. He has had a cough and SOB for 2 weeks. Last Friday he was seen at the ED for this. There he was given rocephin and started on a Z pack. He has seen completed his medication. Today he presents with abdominal pain, SOB, cough, rhonchi and wheezing.   EMS Vitals and CBG: 102 HR 126/92 BP 100.9 Temp 173 CBG 86% O2 sat on RA 93% O2 sat on 6L [place]

## 2019-05-28 LAB — CULTURE, BLOOD (ROUTINE X 2)
Culture: NO GROWTH
Culture: NO GROWTH
Special Requests: ADEQUATE
Special Requests: ADEQUATE

## 2019-05-29 ENCOUNTER — Ambulatory Visit: Payer: Medicare Other | Admitting: Pulmonary Disease

## 2019-05-29 ENCOUNTER — Other Ambulatory Visit: Payer: Self-pay | Admitting: Pharmacist

## 2019-05-29 ENCOUNTER — Ambulatory Visit: Payer: Self-pay | Admitting: Pharmacist

## 2019-05-29 NOTE — Patient Outreach (Addendum)
[facility]) [name] 05/29/2019  [name] [birth year] [id number]  Reason for call: f/u on Extra Help application  Noted 2 recent ED visits: -7/21 w/ fever, cough, SOB, d/c'd with azithromycin, increased prednisone dose to 62m daily -7/24 w/ worsening SOB.  Per pulmonary notes, probably related to slowly progressing ILD, may need long steroid taper and/or pulse ox, d/c'd on increased prednisone dose 461mdaily x 7 days  Successful outreach with patient today.  -Patient reports he was denied for Extra Help application.   -[name] willing to try to substitute [name] --> Spiriva and apply for patient assistance program.   -He states he does not need help with insulin, pays $24/month.   -Picked up dapsone last month but has not refilled prescription.  States there were not refills on prescription.    Plan: Will contact pulmonary off re:  [employer]-Spiriva substitution and need for [employer]   CoRalene BathePharmD, [phone]

## 2019-05-29 NOTE — Progress Notes (Signed)
_0  ID: [name], male    DOB: 22-Jun-1946, 73 y.o.   MRN: [id number]  Chief Complaint  Patient presents with   Hospitalization Follow-up    Recently seen in ED for SOB. He states he saw his PCO and he sent him to ED. Using turdorza daily.     Referring provider: Merrilee Seashore, MD  HPI:  73 year old male former smoker followed in our office for interstitial lung disease due to rheumatoid arthritis  PMH: Rheumatoid arthritis (managed on Rituxan and Imuran) Smoker/ Smoking History: Former smoker. Quit 1986. 5 pack year.  Maintenance: [name], 36m pred, imuran, rituxan infusion q656moPt of: Patient of Dr. RaChase Caller7/28/2020  - Visit   [age]ear old male followed in our office for pulmonary fibrosis.  Patient has had 2 recent emergency room visits.  Patient has been treated with Rocephin as well as a Z-Pak.  Patient is also completed CT chest as well as CT abdomen imaging on 05/26/2019.  Patient has a negative SARS-CoV-2 test on 05/26/2019.  Patient was given Solu-Medrol as well as discharged with outpatient steroid taper and recommended follow-up with pulmonary.  Patient was initially scheduled for follow-up and a pulmonary function test on 05/29/2019 which patient no showed to our office.  Patient was contacted on 05/29/2019, notified that he missed an appointment and rescheduled for hospital follow-up on 05/30/2019.  Patient is also completed follow-up with [facility] where he reported he still has not picked up the dapsone which he initially said he would be able to afford and pick up.  Patient still maintained on [name] could consider prescription for Spiriva which then tried healthcare network pharmacist could help with management of cost.  Patient continues to be a poor historian with management of health.  Patient reports that he is still maintained on 3 mg of prednisone, Imuran, and gets Rituxan infusion every 6 months.  Rituxan infusion is managed by  rheumatology.  Patient reports his next Rituxan infusion is scheduled for 06/01/2019.  [facility] Dr. ArKathlene November  Since last office visit patient is also completed a high-resolution CT chest in July/2020 which showed severe pulmonary fibrosis but stability.  Overall, patient reporting today that he feels back to baseline and has no worsened or acute complaints.  Patient has been working with [facility] to help with cost of medications.  Patient is maintained on TuTunisiahich she received samples from our office.  Patient would prefer to remain on [name] and received samples if we can.  Can consider switching to Spiriva Respimat in the future if we run out of [name] samples.  Patient also to start dapsone.  G6PD was negative and December/2019.  Patient to utilize good Rx coupon to help with cost to bring cost of dapsone down to $30 a month.   Tests:   07/25/2015-CT chest high-res- appearance of the lungs remains compatible with ILD although the distribution of findings is somewhat unusual that overall pattern is most compatible with UIP presumably a manifestation of rheumatoid lung in this patient with a history of rheumatoid arthritis, new 6 x 7 pulmonary nodule right lower lobe  01/20/2016-CT chest without contrast- resolution of right lower lobe pulmonary nodule, no new pulmonary lesions or acute pulmonary findings, stable changes of ILD with fairly extensive honeycombing, stable mediastinal and hilar lymph nodes and collateral vasculature  04/09/2017-CT chest high-res- continued interval progression of fibrotic interstitial lung disease with extensive honeycombing, asymmetrically involving the right long  with mild basilar predominance, diagnostic of UIP, stable mild reactive mediastinal lymphadenopathy  05/12/2019-CT chest high-res-no significant interval change in severe pulmonary fibrosis which is heterogeneously distributed most severe in the  right lung base featuring extensive honeycombing and mild traction bronchiectasis, findings are not significantly changed compared to most recent examination in 2018, clearly progressed over time in comparison of 2013, coronary artery disease  05/26/2019-CT chest with contrast- no acute findings, changes of interstitial fibrosis stable from prior high-resolution CT chest on 05/12/2019, no evidence of pneumonia or pulmonary edema, right thyroid nodule which was biopsied on 12/06/2008 08/01/2018-pulmonary function test--spirometry with DLCO-FVC 2.09 (69% predicted), ratio 89, FEV1 1.87 (84% predicted), DLCO 52  10/06/2018-G6PD-14.4, normal  SIX MIN WALK 04/26/2019 10/06/2018 08/01/2018 09/27/2017 09/27/2017 06/23/2017 04/24/2015  Medications - - - - - - -  Supplimental Oxygen during Test? (L/min) - No No Yes No No No  O2 Flow Rate - - - 2 - - -  Type - - - Continuous - - -  Laps - - - - - - -  Partial Lap (in Meters) - - - - - - -  Baseline BP (sitting) - - - - - - -  Baseline Heartrate - - - - - - -  Baseline Dyspnea (Borg Scale) - - - - - - -  Baseline Fatigue (Borg Scale) - - - - - - -  Baseline SPO2 - - - - - - -  BP (sitting) - - - - - - -  Heartrate - - - - - - -  Dyspnea (Borg Scale) - - - - - - -  Fatigue (Borg Scale) - - - - - - -  SPO2 - - - - - - -  BP (sitting) - - - - - - -  Heartrate - - - - - - -  SPO2 - - - - - - -  Stopped or Paused before Six Minutes - - - - - - -  Distance Completed - - - - - - -  Tech Comments: Pt started walk w/heart rate at 95 and O2_0 . Did a slow to moderate pace walk.Pt tolarated well, by end of lap 3 HR _1  and O2 _2 . steady walk, no desat TA/CMA Pt walked at a normal pace completing all required laps denying any complaints. Pt walked at a normal pace completing all required laps. Only complaint was SOB. - Pt c/o SOB. Denies pain  -  Provider Comments: - - - - - - -   Tolerated walk in office today, walked 3 laps without oxygen desaturations,  maintained on 4 L via POC  FENO:  No results found for: NITRICOXIDE  PFT: PFT Results Latest Ref Rng & Units 08/01/2018 12/20/2017 09/27/2017 12/18/2016 07/29/2015 10/01/2014 01/23/2014  FVC-Pre L 2.09 2.10 2.06 2.21 2.17 2.05 2.18  FVC-Predicted Pre % 69 65 64 68 66 62 66  FVC-Post L - - - 2.28 2.20 2.18 2.24  FVC-Predicted Post % - - - 70 67 66 68  Pre FEV1/FVC % % 89 88 88 86 86 87 86  Post FEV1/FCV % % - - - 88 89 87 88  FEV1-Pre L 1.87 1.84 1.81 1.92 1.88 1.78 1.88  FEV1-Predicted Pre % 84 77 75 80 77 72 76  FEV1-Post L - - - 2.01 1.95 1.90 1.97  DLCO UNC% % 52 45 45 51 62 62 66  DLCO COR %Predicted % 95 80 78 90 119 105 118  TLC L - - - -  3.91 3.67 4.28  TLC % Predicted % - - - - 62 59 68  RV % Predicted % - - - - 63 56 68    Imaging: Ct Chest W Contrast  Result Date: 05/26/2019 CLINICAL DATA:  "The patient is from home and was sent over from the MD office. He has had a cough and SOB for 2 weeks. Last Friday he was seen at the ED for this. There he was given rocephin and started on a Z pack. He has seen completed his medication. Today he presents with abdominal pain, SOB, cough, rhonchi and wheezing. " EXAM: CT CHEST, ABDOMEN, AND PELVIS WITH CONTRAST TECHNIQUE: Multidetector CT imaging of the chest, abdomen and pelvis was performed following the standard protocol during bolus administration of intravenous contrast. CONTRAST:  127m OMNIPAQUE IOHEXOL 300 MG/ML  SOLN COMPARISON:  Chest CT, 05/12/2019 FINDINGS: CT CHEST FINDINGS Cardiovascular: Mild cardiomegaly. No pericardial effusion. Left coronary artery calcifications. Great vessels normal in caliber. Minor aortic atherosclerosis. No dissection. Mediastinum/Nodes: Enlarged right thyroid lobe an apparent poorly defined approximately 2 cm nodule. Several prominent right paratracheal lymph nodes, largest measuring 1.1 cm. No mediastinal or hilar masses. Trachea and esophagus are unremarkable. Lungs/Pleura: Changes of interstitial fibrosis  with honeycombing bronchiectasis and architectural distortion, greater in the right lung than the left, involving all lobes. Appearance is stable from the prior high-resolution chest CT. No evidence of pneumonia or pulmonary edema. No mass or suspicious nodule. No pleural effusion or pneumothorax. Musculoskeletal: No fracture or acute finding. No osteoblastic or osteolytic lesions. CT ABDOMEN PELVIS FINDINGS Hepatobiliary: No focal liver abnormality is seen. No gallstones, gallbladder wall thickening, or biliary dilatation. Pancreas: Unremarkable. No pancreatic ductal dilatation or surrounding inflammatory changes. Spleen: Normal in size without focal abnormality. Adrenals/Urinary Tract: No adrenal masses. Kidneys normal in overall size, orientation and position. Mild renal cortical thinning. There are bilateral low-attenuation renal masses consistent with cysts, largest from the anterior midpole the left kidney, 3.8 cm. No stones. No hydronephrosis. Ureters normal course and in caliber. Bladder is unremarkable. Stomach/Bowel: Stomach is unremarkable. Small bowel is normal caliber. No wall thickening or inflammation. There multiple colonic diverticula with no evidence of diverticulitis. No colonic wall thickening or other inflammatory process. Normal appendix visualized. Vascular/Lymphatic: No significant vascular findings are present. No enlarged abdominal or pelvic lymph nodes. Reproductive: Unremarkable. Other: No ascites. Musculoskeletal: No fracture or acute finding. No osteoblastic or osteolytic lesions. IMPRESSION: CHEST CT 1. No acute findings. 2. Changes of interstitial fibrosis stable from the prior high-resolution chest CT from 05/12/2019. No evidence pneumonia or pulmonary edema. 3. Right thyroid nodule.  This has been biopsied on 12/06/2008. 4. Mild cardiomegaly.  Minimal aortic atherosclerosis. ABDOMEN AND PELVIS CT 1. No acute findings in the abdomen or pelvis. 2. Colonic diverticula without  diverticulitis. 3. Renal cysts. Electronically Signed   By: DLajean ManesM.D.   On: 05/26/2019 15:38   Ct Abdomen Pelvis W Contrast  Result Date: 05/26/2019 CLINICAL DATA:  "The patient is from home and was sent over from the MD office. He has had a cough and SOB for 2 weeks. Last Friday he was seen at the ED for this. There he was given rocephin and started on a Z pack. He has seen completed his medication. Today he presents with abdominal pain, SOB, cough, rhonchi and wheezing. " EXAM: CT CHEST, ABDOMEN, AND PELVIS WITH CONTRAST TECHNIQUE: Multidetector CT imaging of the chest, abdomen and pelvis was performed following the standard protocol during bolus administration of intravenous  contrast. CONTRAST:  138m OMNIPAQUE IOHEXOL 300 MG/ML  SOLN COMPARISON:  Chest CT, 05/12/2019 FINDINGS: CT CHEST FINDINGS Cardiovascular: Mild cardiomegaly. No pericardial effusion. Left coronary artery calcifications. Great vessels normal in caliber. Minor aortic atherosclerosis. No dissection. Mediastinum/Nodes: Enlarged right thyroid lobe an apparent poorly defined approximately 2 cm nodule. Several prominent right paratracheal lymph nodes, largest measuring 1.1 cm. No mediastinal or hilar masses. Trachea and esophagus are unremarkable. Lungs/Pleura: Changes of interstitial fibrosis with honeycombing bronchiectasis and architectural distortion, greater in the right lung than the left, involving all lobes. Appearance is stable from the prior high-resolution chest CT. No evidence of pneumonia or pulmonary edema. No mass or suspicious nodule. No pleural effusion or pneumothorax. Musculoskeletal: No fracture or acute finding. No osteoblastic or osteolytic lesions. CT ABDOMEN PELVIS FINDINGS Hepatobiliary: No focal liver abnormality is seen. No gallstones, gallbladder wall thickening, or biliary dilatation. Pancreas: Unremarkable. No pancreatic ductal dilatation or surrounding inflammatory changes. Spleen: Normal in size without  focal abnormality. Adrenals/Urinary Tract: No adrenal masses. Kidneys normal in overall size, orientation and position. Mild renal cortical thinning. There are bilateral low-attenuation renal masses consistent with cysts, largest from the anterior midpole the left kidney, 3.8 cm. No stones. No hydronephrosis. Ureters normal course and in caliber. Bladder is unremarkable. Stomach/Bowel: Stomach is unremarkable. Small bowel is normal caliber. No wall thickening or inflammation. There multiple colonic diverticula with no evidence of diverticulitis. No colonic wall thickening or other inflammatory process. Normal appendix visualized. Vascular/Lymphatic: No significant vascular findings are present. No enlarged abdominal or pelvic lymph nodes. Reproductive: Unremarkable. Other: No ascites. Musculoskeletal: No fracture or acute finding. No osteoblastic or osteolytic lesions. IMPRESSION: CHEST CT 1. No acute findings. 2. Changes of interstitial fibrosis stable from the prior high-resolution chest CT from 05/12/2019. No evidence pneumonia or pulmonary edema. 3. Right thyroid nodule.  This has been biopsied on 12/06/2008. 4. Mild cardiomegaly.  Minimal aortic atherosclerosis. ABDOMEN AND PELVIS CT 1. No acute findings in the abdomen or pelvis. 2. Colonic diverticula without diverticulitis. 3. Renal cysts. Electronically Signed   By: DLajean ManesM.D.   On: 05/26/2019 15:38   Ct Chest High Resolution  Result Date: 05/12/2019 CLINICAL DATA:  ILD, rheumatoid arthritis EXAM: CT CHEST WITHOUT CONTRAST TECHNIQUE: Multidetector CT imaging of the chest was performed following the standard protocol without intravenous contrast. High resolution imaging of the lungs, as well as inspiratory and expiratory imaging, was performed. COMPARISON:  04/09/2017, 01/20/2016, 07/25/2015, 01/23/2014, 01/28/2012 FINDINGS: Cardiovascular: Scattered aortic atherosclerosis. Normal heart size. Scattered three-vessel coronary artery calcification.  No pericardial effusion. Mediastinum/Nodes: Unchanged enlarged mediastinal lymph nodes, likely reactive in the setting of fibrosis. Thyroid gland, trachea, and esophagus demonstrate no significant findings. Lungs/Pleura: No significant interval change in severe pulmonary fibrosis, which is heterogeneously distributed and most severe in the right lung base, featuring extensive honeycombing and mild traction bronchiectasis. Although findings are not significantly changed compared to examination dated 2018, they are clearly progressed over time in comparison to priors dating back to 2013. No significant air trapping. No pleural effusion or pneumothorax. Upper Abdomen: No acute abnormality. Musculoskeletal: No chest wall mass or suspicious bone lesions identified. IMPRESSION: 1. No significant interval change in severe pulmonary fibrosis, which is heterogeneously distributed and most severe in the right lung base, featuring extensive honeycombing and mild traction bronchiectasis. Although findings are not significantly changed compared to most recent examination dated 2018, they are clearly progressed over time in comparison to priors dating back to 2013. Findings remain in a UIP pattern by ATS  pulmonary fibrosis criteria 2.  Coronary artery disease. Electronically Signed   By: Eddie Candle [name]   On: 05/12/2019 13:14   Dg Chest Port 1 View  Result Date: 05/26/2019 CLINICAL DATA:  Shortness of breath, cough EXAM: PORTABLE CHEST 1 VIEW COMPARISON:  05/23/2019 FINDINGS: Cardiomediastinal contours are normal. Severe chronic lung disease and fibrosis are again noted throughout the lungs, right worse than left. No definite new airspace consolidation. No pleural effusion or pneumothorax. IMPRESSION: Chronic interstitial lung disease/fibrosis. No definite superimposed airspace disease. Electronically Signed   By: Davina Poke [name]   On: 05/26/2019 12:36   Dg Chest Port 1 View  Result Date: 05/23/2019 CLINICAL DATA:   Sepsis, fever EXAM: PORTABLE CHEST 1 VIEW COMPARISON:  Chest CT 05/12/2019 FINDINGS: Severe chronic lung disease/fibrosis within the lungs. No visible acute opacity or effusion. Heart is upper limits normal in size. No acute bony abnormality. IMPRESSION: Severe chronic lung disease/fibrosis.  No definite acute process. Electronically Signed   By: Rolm Baptise [name]   On: 05/23/2019 12:20      Specialty Problems      Pulmonary Problems   Pneumonia   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)    07/25/2015-CT chest high-res- appearance of the lungs remains compatible with ILD although the distribution of findings is somewhat unusual that overall pattern is most compatible with UIP presumably a manifestation of rheumatoid lung in this patient with a history of rheumatoid arthritis, new 6 x 7 pulmonary nodule right lower lobe  01/20/2016-CT chest without contrast- resolution of right lower lobe pulmonary nodule, no new pulmonary lesions or acute pulmonary findings, stable changes of ILD with fairly extensive honeycombing, stable mediastinal and hilar lymph nodes and collateral vasculature  04/09/2017-CT chest high-res- continued interval progression of fibrotic interstitial lung disease with extensive honeycombing, asymmetrically involving the right long with mild basilar predominance, diagnostic of UIP, stable mild reactive mediastinal lymphadenopathy  05/12/2019-CT chest high-res-no significant interval change in severe pulmonary fibrosis which is heterogeneously distributed most severe in the right lung base featuring extensive honeycombing and mild traction bronchiectasis, findings are not significantly changed compared to most recent examination in 2018, clearly progressed over time in comparison of 2013, coronary artery disease  05/26/2019-CT chest with contrast- no acute findings, changes of interstitial fibrosis stable from prior high-resolution CT chest on 05/12/2019, no evidence of pneumonia  or pulmonary edema, right thyroid nodule which was biopsied on 12/06/2008   08/01/2018-pulmonary function test--spirometry with DLCO-FVC 2.09 (69% predicted), ratio 89, FEV1 1.87 (84% predicted), DLCO 52       Chronic cough   Nodule of right lung   Wheezing on auscultation   Nocturnal hypoxemia   Chronic respiratory failure with hypoxia (HCC)      Allergies  Allergen Reactions   Bactrim [Sulfamethoxazole-Trimethoprim]     Immunization History  Administered Date(s) Administered   Influenza Split 08/03/2011, 07/03/2012, 09/02/2013, 07/03/2014   Influenza, High Dose Seasonal PF 08/02/2016, 08/02/2017, 07/25/2018   Influenza,inj,Quad PF,6+ Mos 07/29/2015   Pneumococcal Conjugate-13 08/01/2018   Pneumococcal Polysaccharide-23 11/03/2007   Needs Pneumovax 23 in oct/2020  Past Medical History:  Diagnosis Date   Arthritis    Diabetes mellitus    Hypertension    Pneumonia    Thyroid disease     Tobacco History: Social History   Tobacco Use  Smoking Status Former Smoker   Packs/day: 0.50   Years: 10.00   Pack years: 5.00   Types: Cigarettes   Quit date: 12/03/1984  Years since quitting: 34.5  Smokeless Tobacco Never Used   Counseling given: Yes  Continue to not smoke  Outpatient Encounter Medications as of 05/30/2019  Medication Sig   Aclidinium Bromide ([name] PRESSAIR) 400 MCG/ACT AEPB Inhale 1 puff into the lungs 2 (two) times a day.   amLODipine (NORVASC) 10 MG tablet Take 10 mg by mouth daily.   aspirin 81 MG tablet Take 81 mg by mouth daily.   atorvastatin (LIPITOR) 20 MG tablet Take 20 mg by mouth daily.   azaTHIOprine (IMURAN) 50 MG tablet Take 50 mg by mouth 2 (two) times a day.   dapsone 100 MG tablet Take 1 tablet (100 mg total) by mouth daily.   folic acid (FOLVITE) 154 MCG tablet Take 400 mcg by mouth daily.   levothyroxine (SYNTHROID, LEVOTHROID) 25 MCG tablet Take 25 mcg by mouth daily.   loratadine (CLARITIN) 10 MG tablet  Take 1 tablet (10 mg total) by mouth daily.   metFORMIN (GLUCOPHAGE-XR) 500 MG 24 hr tablet Take 1 tablet by mouth 2 (two) times daily.   NOVOLIN N RELION 100 UNIT/ML injection Inject 20-22 Units into the skin 2 (two) times a day. 20 units in morning,22 units in evening.   predniSONE (DELTASONE) 1 MG tablet Take 3 mg by mouth daily with breakfast.   [DISCONTINUED] dapsone 100 MG tablet Take 100 mg by mouth daily.   [DISCONTINUED] Aclidinium Bromide ([name] PRESSAIR) 400 MCG/ACT AEPB Inhale 1 puff into the lungs 2 (two) times a day.   [DISCONTINUED] azithromycin (ZITHROMAX) 250 MG tablet Take 1 tablet (250 mg total) by mouth daily. (Patient not taking: Reported on 05/30/2019)   [DISCONTINUED] predniSONE (DELTASONE) 20 MG tablet Take 2 tablets (40 mg total) by mouth daily for 7 days. (Patient not taking: Reported on 05/30/2019)   No facility-administered encounter medications on file as of 05/30/2019.      Review of Systems  Review of Systems  Constitutional: Negative for activity change, chills, fatigue, fever and unexpected weight change.  HENT: Negative for postnasal drip, rhinorrhea, sinus pressure, sinus pain and sore throat.   Eyes: Negative.   Respiratory: Positive for shortness of breath (Baseline). Negative for cough and wheezing.   Cardiovascular: Negative for chest pain and palpitations.  Gastrointestinal: Negative for constipation, diarrhea, nausea and vomiting.  Endocrine: Negative.   Genitourinary: Negative.   Musculoskeletal: Negative.  Negative for gait problem.  Skin: Negative.   Neurological: Negative for dizziness and headaches.  Psychiatric/Behavioral: Negative.  Negative for dysphoric mood. The patient is not nervous/anxious.   All other systems reviewed and are negative.    Physical Exam  BP 128/60    Pulse 86    Temp 98 F (36.7 C) (Oral)    Ht _0  (1.676 m)    Wt 186 lb (84.4 kg)    SpO2 94%    BMI 30.02 kg/m   Wt Readings from Last 5 Encounters:    05/30/19 186 lb (84.4 kg)  05/26/19 192 lb 15.9 oz (87.5 kg)  05/23/19 193 lb (87.5 kg)  04/26/19 194 lb 3.2 oz (88.1 kg)  10/17/18 189 lb (85.7 kg)     Physical Exam Vitals signs and nursing note reviewed.  Constitutional:      General: [name] not in acute distress.    Comments: Chronically ill elderly male  HENT:     Head: Normocephalic and atraumatic.     Right Ear: Hearing and external ear normal.     Left Ear: Hearing and external ear normal.  Nose: Rhinorrhea present. No mucosal edema.     Right Turbinates: Not enlarged.     Left Turbinates: Not enlarged.     Mouth/Throat:     Mouth: Mucous membranes are dry.     Pharynx: Oropharynx is clear. No oropharyngeal exudate.  Eyes:     Pupils: Pupils are equal, round, and reactive to light.  Neck:     Musculoskeletal: Normal range of motion.  Cardiovascular:     Rate and Rhythm: Normal rate and regular rhythm.     Pulses: Normal pulses.     Heart sounds: Normal heart sounds. No murmur.  Pulmonary:     Effort: Pulmonary effort is normal.     Breath sounds: Rhonchi and rales present. No decreased breath sounds or wheezing.  Abdominal:     General: Bowel sounds are normal. There is no distension.     Palpations: Abdomen is soft.     Tenderness: There is no abdominal tenderness.  Musculoskeletal:     Right lower leg: No edema.     Left lower leg: No edema.  Lymphadenopathy:     Cervical: No cervical adenopathy.  Skin:    General: Skin is warm and dry.     Capillary Refill: Capillary refill takes less than 2 seconds.     Findings: No erythema or rash.  Neurological:     General: No focal deficit present.     Mental Status: [name] alert and oriented to person, place, and time.     Motor: No weakness.     Coordination: Coordination normal.     Gait: Gait is intact. Gait normal.     Comments: Tolerated walk in office today, 3 laps, needed 4 L O2  Psychiatric:        Mood and Affect: Mood normal.        Behavior:  Behavior normal. Behavior is cooperative.        Thought Content: Thought content normal.        Judgment: Judgment normal.      Lab Results:  CBC    Component Value Date/Time   WBC 5.0 05/23/2019 1118   RBC 3.42 (L) 05/23/2019 1118   HGB 10.5 (L) 05/23/2019 1118   HCT 34.1 (L) 05/23/2019 1118   PLT 168 05/23/2019 1118   MCV 99.7 05/23/2019 1118   MCH 30.7 05/23/2019 1118   MCHC 30.8 05/23/2019 1118   RDW 17.3 (H) 05/23/2019 1118   LYMPHSABS 0.6 (L) 05/23/2019 1118   MONOABS 0.4 05/23/2019 1118   EOSABS 0.3 05/23/2019 1118   BASOSABS 0.0 05/23/2019 1118    BMET    Component Value Date/Time   NA 140 05/26/2019 1235   K 4.0 05/26/2019 1235   CL 107 05/26/2019 1235   CO2 26 05/26/2019 1235   GLUCOSE 162 (H) 05/26/2019 1235   BUN 12 05/26/2019 1235   CREATININE 1.12 05/26/2019 1235   CREATININE 1.21 (H) 10/17/2018 1641   CALCIUM 7.8 (L) 05/26/2019 1235   GFRNONAA >60 05/26/2019 1235   GFRAA >60 05/26/2019 1235    BNP    Component Value Date/Time   BNP 43.6 05/26/2019 1226    ProBNP    Component Value Date/Time   PROBNP 103.7 12/15/2011 1855      Assessment & Plan:   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) Plan: Continue follow-up with rheumatology Continue Imuran, Rituxan infusions every 6 months, prednisone 3 mg daily Pulmonary function test in 4 weeks Continue [name] Continue nocturnal  oxygen Could consider anti-fibrotic's in the future based off of pulmonary function test, currently stable severe pulmonary fibrosis on CT imaging Start dapsone Pneumovax 23 in October/2020  Rheumatoid arthritis ([place]) Plan: Continue follow-up with rheumatology Continue forward with planned Rituxan infusion on 06/01/2019 Continue Imuran 20 prednisone 3 mg daily Start dapsone  Therapeutic drug monitoring Starting dapsone 05/30/2019 G6PD- 14.4 normal  December/2019  Plan: Lab work today Weekly CBC for 4 weeks, then monthly CBC for 6 months, then CBC  every 6 months LFTs today   Chronic respiratory failure with hypoxia ([place]) Plan: continue oxygen therapy as prescribed Needs 4 L via POC with physical exertion Continue nocturnal oxygen  Nocturnal hypoxemia Plan: Continue 2 L of O2 at night  Medication management Plan: Start dapsone Good Rx coupon printed for patient today [name] samples provided today, likely will need to consider Spiriva in the future when [name] samples run out of office    Return in about 4 weeks (around 06/27/2019), or if symptoms worsen or fail to improve, for Follow up with Wyn Quaker FNP-C, Follow up with Dr. Purnell Shoemaker, Follow up for PFT.   Lauraine Rinne, NP 05/30/2019   This appointment was 42 minutes long with over 50% of the time in direct face-to-face patient care, assessment, plan of care, and follow-up.

## 2019-05-29 NOTE — Progress Notes (Deleted)
_0  ID: [name], male    DOB: [birth year], 73 y.o.   MRN: [id number]  No chief complaint on file.   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 RaChase Caller7/27/2020  - Visit   HPI  Tests:   04/09/2017-CT chest high-res- continued interval progression of fibrotic interstitial lung disease with extensive honeycombing, asymmetrically involving the right long with mild basilar predominance, diagnostic of UIP, stable mild reactive mediastinal lymphadenopathy  01/20/2016-CT chest without contrast- resolution of right lower lobe pulmonary nodule, no new pulmonary lesions or acute pulmonary findings, stable changes of ILD with fairly extensive honeycombing, stable mediastinal and hilar lymph nodes and collateral vasculature  07/25/2015-CT chest high-res- appearance of the lungs remains compatible with ILD although the distribution of findings is somewhat unusual that overall pattern is most compatible with UIP presumably a manifestation of rheumatoid lung in this patient with a history of rheumatoid arthritis, new 6 x 7 pulmonary nodule right lower lobe  08/01/2018-pulmonary function test--spirometry with DLCO-FVC 2.09 (69% predicted), ratio 89, FEV1 1.87 (84% predicted), DLCO 52  FENO:  No results found for: NITRICOXIDE  PFT: PFT Results Latest Ref Rng & Units 08/01/2018 12/20/2017 09/27/2017 12/18/2016 07/29/2015 10/01/2014 01/23/2014  FVC-Pre L 2.09 2.10 2.06 2.21 2.17 2.05 2.18  FVC-Predicted Pre % 69 65 64 68 66 62 66  FVC-Post L - - - 2.28 2.20 2.18 2.24  FVC-Predicted Post % - - - 70 67 66 68  Pre FEV1/FVC % % 89 88 88 86 86 87 86  Post FEV1/FCV % % - - - 88 89 87 88  FEV1-Pre L  1.87 1.84 1.81 1.92 1.88 1.78 1.88  FEV1-Predicted Pre % 84 77 75 80 77 72 76  FEV1-Post L - - - 2.01 1.95 1.90 1.97  DLCO UNC% % 52 45 45 51 62 62 66  DLCO COR %Predicted % 95 80 78 90 119 105 118  TLC L - - - - 3.91 3.67 4.28  TLC % Predicted % - - - - 62 59 68  RV % Predicted % - - - - 63 56 68    Imaging: Ct Chest W Contrast  Result Date: 05/26/2019 CLINICAL DATA:  "The patient is from home and was sent over from the MD office. He has had a cough and SOB for 2 weeks. Last Friday he was seen at the ED for this. There he was given rocephin and started on a Z pack. He has seen completed his medication. Today he presents with abdominal pain, SOB, cough, rhonchi and wheezing. " EXAM: CT CHEST, ABDOMEN, AND PELVIS WITH CONTRAST TECHNIQUE: Multidetector CT imaging of the chest, abdomen and pelvis was performed following the standard protocol during bolus administration of intravenous contrast. CONTRAST:  10014mMNIPAQUE IOHEXOL 300 MG/ML  SOLN COMPARISON:  Chest CT, 05/12/2019 FINDINGS: CT CHEST FINDINGS Cardiovascular: Mild cardiomegaly. No pericardial effusion. Left coronary artery calcifications. Great vessels normal in caliber. Minor aortic atherosclerosis. No dissection. Mediastinum/Nodes: Enlarged right thyroid lobe an apparent poorly defined approximately 2 cm nodule. Several prominent right paratracheal lymph nodes, largest measuring 1.1 cm. No mediastinal or hilar masses. Trachea and esophagus are unremarkable. Lungs/Pleura: Changes of interstitial fibrosis with honeycombing bronchiectasis and architectural distortion, greater in the right lung  than the left, involving all lobes. Appearance is stable from the prior high-resolution chest CT. No evidence of pneumonia or pulmonary edema. No mass or suspicious nodule. No pleural effusion or pneumothorax. Musculoskeletal: No fracture or acute finding. No osteoblastic or osteolytic lesions. CT ABDOMEN PELVIS FINDINGS Hepatobiliary: No focal liver  abnormality is seen. No gallstones, gallbladder wall thickening, or biliary dilatation. Pancreas: Unremarkable. No pancreatic ductal dilatation or surrounding inflammatory changes. Spleen: Normal in size without focal abnormality. Adrenals/Urinary Tract: No adrenal masses. Kidneys normal in overall size, orientation and position. Mild renal cortical thinning. There are bilateral low-attenuation renal masses consistent with cysts, largest from the anterior midpole the left kidney, 3.8 cm. No stones. No hydronephrosis. Ureters normal course and in caliber. Bladder is unremarkable. Stomach/Bowel: Stomach is unremarkable. Small bowel is normal caliber. No wall thickening or inflammation. There multiple colonic diverticula with no evidence of diverticulitis. No colonic wall thickening or other inflammatory process. Normal appendix visualized. Vascular/Lymphatic: No significant vascular findings are present. No enlarged abdominal or pelvic lymph nodes. Reproductive: Unremarkable. Other: No ascites. Musculoskeletal: No fracture or acute finding. No osteoblastic or osteolytic lesions. IMPRESSION: CHEST CT 1. No acute findings. 2. Changes of interstitial fibrosis stable from the prior high-resolution chest CT from 05/12/2019. No evidence pneumonia or pulmonary edema. 3. Right thyroid nodule.  This has been biopsied on 12/06/2008. 4. Mild cardiomegaly.  Minimal aortic atherosclerosis. ABDOMEN AND PELVIS CT 1. No acute findings in the abdomen or pelvis. 2. Colonic diverticula without diverticulitis. 3. Renal cysts. Electronically Signed   By: Lajean Manes [name]   On: 05/26/2019 15:38   Ct Abdomen Pelvis W Contrast  Result Date: 05/26/2019 CLINICAL DATA:  "The patient is from home and was sent over from the MD office. He has had a cough and SOB for 2 weeks. Last Friday he was seen at the ED for this. There he was given rocephin and started on a Z pack. He has seen completed his medication. Today he presents with abdominal  pain, SOB, cough, rhonchi and wheezing. " EXAM: CT CHEST, ABDOMEN, AND PELVIS WITH CONTRAST TECHNIQUE: Multidetector CT imaging of the chest, abdomen and pelvis was performed following the standard protocol during bolus administration of intravenous contrast. CONTRAST:  184m OMNIPAQUE IOHEXOL 300 MG/ML  SOLN COMPARISON:  Chest CT, 05/12/2019 FINDINGS: CT CHEST FINDINGS Cardiovascular: Mild cardiomegaly. No pericardial effusion. Left coronary artery calcifications. Great vessels normal in caliber. Minor aortic atherosclerosis. No dissection. Mediastinum/Nodes: Enlarged right thyroid lobe an apparent poorly defined approximately 2 cm nodule. Several prominent right paratracheal lymph nodes, largest measuring 1.1 cm. No mediastinal or hilar masses. Trachea and esophagus are unremarkable. Lungs/Pleura: Changes of interstitial fibrosis with honeycombing bronchiectasis and architectural distortion, greater in the right lung than the left, involving all lobes. Appearance is stable from the prior high-resolution chest CT. No evidence of pneumonia or pulmonary edema. No mass or suspicious nodule. No pleural effusion or pneumothorax. Musculoskeletal: No fracture or acute finding. No osteoblastic or osteolytic lesions. CT ABDOMEN PELVIS FINDINGS Hepatobiliary: No focal liver abnormality is seen. No gallstones, gallbladder wall thickening, or biliary dilatation. Pancreas: Unremarkable. No pancreatic ductal dilatation or surrounding inflammatory changes. Spleen: Normal in size without focal abnormality. Adrenals/Urinary Tract: No adrenal masses. Kidneys normal in overall size, orientation and position. Mild renal cortical thinning. There are bilateral low-attenuation renal masses consistent with cysts, largest from the anterior midpole the left kidney, 3.8 cm. No stones. No hydronephrosis. Ureters normal course and in caliber. Bladder is unremarkable. Stomach/Bowel: Stomach is unremarkable. Small  bowel is normal caliber. No  wall thickening or inflammation. There multiple colonic diverticula with no evidence of diverticulitis. No colonic wall thickening or other inflammatory process. Normal appendix visualized. Vascular/Lymphatic: No significant vascular findings are present. No enlarged abdominal or pelvic lymph nodes. Reproductive: Unremarkable. Other: No ascites. Musculoskeletal: No fracture or acute finding. No osteoblastic or osteolytic lesions. IMPRESSION: CHEST CT 1. No acute findings. 2. Changes of interstitial fibrosis stable from the prior high-resolution chest CT from 05/12/2019. No evidence pneumonia or pulmonary edema. 3. Right thyroid nodule.  This has been biopsied on 12/06/2008. 4. Mild cardiomegaly.  Minimal aortic atherosclerosis. ABDOMEN AND PELVIS CT 1. No acute findings in the abdomen or pelvis. 2. Colonic diverticula without diverticulitis. 3. Renal cysts. Electronically Signed   By: Lajean Manes [name]   On: 05/26/2019 15:38   Ct Chest High Resolution  Result Date: 05/12/2019 CLINICAL DATA:  ILD, rheumatoid arthritis EXAM: CT CHEST WITHOUT CONTRAST TECHNIQUE: Multidetector CT imaging of the chest was performed following the standard protocol without intravenous contrast. High resolution imaging of the lungs, as well as inspiratory and expiratory imaging, was performed. COMPARISON:  04/09/2017, 01/20/2016, 07/25/2015, 01/23/2014, 01/28/2012 FINDINGS: Cardiovascular: Scattered aortic atherosclerosis. Normal heart size. Scattered three-vessel coronary artery calcification. No pericardial effusion. Mediastinum/Nodes: Unchanged enlarged mediastinal lymph nodes, likely reactive in the setting of fibrosis. Thyroid gland, trachea, and esophagus demonstrate no significant findings. Lungs/Pleura: No significant interval change in severe pulmonary fibrosis, which is heterogeneously distributed and most severe in the right lung base, featuring extensive honeycombing and mild traction bronchiectasis. Although findings are  not significantly changed compared to examination dated 2018, they are clearly progressed over time in comparison to priors dating back to 2013. No significant air trapping. No pleural effusion or pneumothorax. Upper Abdomen: No acute abnormality. Musculoskeletal: No chest wall mass or suspicious bone lesions identified. IMPRESSION: 1. No significant interval change in severe pulmonary fibrosis, which is heterogeneously distributed and most severe in the right lung base, featuring extensive honeycombing and mild traction bronchiectasis. Although findings are not significantly changed compared to most recent examination dated 2018, they are clearly progressed over time in comparison to priors dating back to 2013. Findings remain in a UIP pattern by ATS pulmonary fibrosis criteria 2.  Coronary artery disease. Electronically Signed   By: Eddie Candle [name]   On: 05/12/2019 13:14   Dg Chest Port 1 View  Result Date: 05/26/2019 CLINICAL DATA:  Shortness of breath, cough EXAM: PORTABLE CHEST 1 VIEW COMPARISON:  05/23/2019 FINDINGS: Cardiomediastinal contours are normal. Severe chronic lung disease and fibrosis are again noted throughout the lungs, right worse than left. No definite new airspace consolidation. No pleural effusion or pneumothorax. IMPRESSION: Chronic interstitial lung disease/fibrosis. No definite superimposed airspace disease. Electronically Signed   By: Davina Poke [name]   On: 05/26/2019 12:36   Dg Chest Port 1 View  Result Date: 05/23/2019 CLINICAL DATA:  Sepsis, fever EXAM: PORTABLE CHEST 1 VIEW COMPARISON:  Chest CT 05/12/2019 FINDINGS: Severe chronic lung disease/fibrosis within the lungs. No visible acute opacity or effusion. Heart is upper limits normal in size. No acute bony abnormality. IMPRESSION: Severe chronic lung disease/fibrosis.  No definite acute process. Electronically Signed   By: Rolm Baptise [name]   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)    07/25/2015-CT chest high-res- appearance of the lungs remains compatible  with ILD although the distribution of findings is somewhat unusual that overall pattern is most compatible with UIP presumably a manifestation of rheumatoid lung in this patient with a history of rheumatoid arthritis, new 6 x 7 pulmonary nodule right lower lobe  01/20/2016-CT chest without contrast- resolution of right lower lobe pulmonary nodule, no new pulmonary lesions or acute pulmonary findings, stable changes of ILD with fairly extensive honeycombing, stable mediastinal and hilar lymph nodes and collateral vasculature  04/09/2017-CT chest high-res- continued interval progression of fibrotic interstitial lung disease with extensive honeycombing, asymmetrically involving the right long with mild basilar predominance, diagnostic of UIP, stable mild reactive mediastinal lymphadenopathy  08/01/2018-pulmonary function test--spirometry with DLCO-FVC 2.09 (69% predicted), ratio 89, FEV1 1.87 (84% predicted), DLCO 52       Chronic cough   Nodule of right lung   Wheezing on auscultation   Nocturnal hypoxemia      Allergies  Allergen Reactions   Bactrim [Sulfamethoxazole-Trimethoprim]     Immunization History  Administered Date(s) Administered   Influenza Split 08/03/2011, 07/03/2012, 09/02/2013, 07/03/2014   Influenza, High Dose Seasonal PF 08/02/2016, 08/02/2017, 07/25/2018   Influenza,inj,Quad PF,6+ Mos 07/29/2015   Pneumococcal Conjugate-13 08/01/2018   Pneumococcal Polysaccharide-23 11/03/2007    Past Medical History:  Diagnosis Date   Arthritis    Diabetes mellitus    Hypertension    Pneumonia    Thyroid disease     Tobacco History: Social History   Tobacco Use  Smoking Status Former Smoker   Packs/day: 0.50   Years: 10.00   Pack years: 5.00   Types: Cigarettes   Quit date: 12/03/1984   Years since quitting: 34.5   Smokeless Tobacco Never Used   Counseling given: Not Answered   Continue to not smoke  Outpatient Encounter Medications as of 05/29/2019  Medication Sig   Aclidinium Bromide ([name] PRESSAIR) 400 MCG/ACT AEPB Inhale 1 puff into the lungs 2 (two) times a day.   amLODipine (NORVASC) 10 MG tablet Take 10 mg by mouth daily.   aspirin 81 MG tablet Take 81 mg by mouth daily.   atorvastatin (LIPITOR) 20 MG tablet Take 20 mg by mouth daily.   azaTHIOprine (IMURAN) 50 MG tablet Take 50 mg by mouth 2 (two) times a day.   azithromycin (ZITHROMAX) 250 MG tablet Take 1 tablet (250 mg total) by mouth daily.   dapsone 100 MG tablet Take 100 mg by mouth daily.   folic acid (FOLVITE) 086 MCG tablet Take 400 mcg by mouth daily.   levothyroxine (SYNTHROID, LEVOTHROID) 25 MCG tablet Take 25 mcg by mouth daily.   loratadine (CLARITIN) 10 MG tablet Take 1 tablet (10 mg total) by mouth daily. (Patient not taking: Reported on 05/26/2019)   metFORMIN (GLUCOPHAGE-XR) 500 MG 24 hr tablet Take 1 tablet by mouth 2 (two) times daily.   NOVOLIN N RELION 100 UNIT/ML injection Inject 20-22 Units into the skin 2 (two) times a day. 20 units in morning,22 units in evening.   predniSONE (DELTASONE) 20 MG tablet Take 2 tablets (40 mg total) by mouth daily for 7 days.   No facility-administered encounter medications on file as of 05/29/2019.      Review of Systems  Review of Systems   Physical Exam  There were no vitals taken for this visit.  Wt Readings from Last 5 Encounters:  05/26/19 192 lb 15.9 oz (87.5 kg)  05/23/19 193 lb (87.5 kg)  04/26/19 194 lb 3.2 oz (88.1 kg)  10/17/18 189 lb (85.7 kg)  10/06/18  192 lb 12.8 oz (87.5 kg)     Physical Exam   Lab Results:  CBC    Component Value Date/Time   WBC 5.0 05/23/2019 1118   RBC 3.42 (L) 05/23/2019 1118   HGB 10.5 (L) 05/23/2019 1118   HCT 34.1 (L) 05/23/2019 1118   PLT 168 05/23/2019 1118   MCV 99.7 05/23/2019 1118   MCH 30.7  05/23/2019 1118   MCHC 30.8 05/23/2019 1118   RDW 17.3 (H) 05/23/2019 1118   LYMPHSABS 0.6 (L) 05/23/2019 1118   MONOABS 0.4 05/23/2019 1118   EOSABS 0.3 05/23/2019 1118   BASOSABS 0.0 05/23/2019 1118    BMET    Component Value Date/Time   NA 140 05/26/2019 1235   K 4.0 05/26/2019 1235   CL 107 05/26/2019 1235   CO2 26 05/26/2019 1235   GLUCOSE 162 (H) 05/26/2019 1235   BUN 12 05/26/2019 1235   CREATININE 1.12 05/26/2019 1235   CREATININE 1.21 (H) 10/17/2018 1641   CALCIUM 7.8 (L) 05/26/2019 1235   GFRNONAA >60 05/26/2019 1235   GFRAA >60 05/26/2019 1235    BNP    Component Value Date/Time   BNP 43.6 05/26/2019 1226    ProBNP    Component Value Date/Time   PROBNP 103.7 12/15/2011 1855      Assessment & Plan:   No problem-specific Assessment & Plan notes found for this encounter.    No follow-ups on file.   Lauraine Rinne, NP 05/29/2019   This appointment was *** minutes long with over 50% of the time in direct face-to-face patient care, assessment, plan of care, and follow-up.

## 2019-05-29 NOTE — Telephone Encounter (Signed)
Pt scheduled for OV with Wyn Quaker, NP today, 7/27 having PFT at 11am followed by OV at 12. Nothing further needed.

## 2019-05-30 ENCOUNTER — Other Ambulatory Visit: Payer: Self-pay

## 2019-05-30 ENCOUNTER — Ambulatory Visit (INDEPENDENT_AMBULATORY_CARE_PROVIDER_SITE_OTHER): Payer: Medicare Other | Admitting: Pulmonary Disease

## 2019-05-30 ENCOUNTER — Other Ambulatory Visit: Payer: Self-pay | Admitting: Pharmacist

## 2019-05-30 ENCOUNTER — Encounter: Payer: Self-pay | Admitting: Pulmonary Disease

## 2019-05-30 VITALS — BP 128/60 | HR 86 | Temp 98.0°F | Ht 66.0 in | Wt 186.0 lb

## 2019-05-30 DIAGNOSIS — G4734 Idiopathic sleep related nonobstructive alveolar hypoventilation: Secondary | ICD-10-CM

## 2019-05-30 DIAGNOSIS — J8410 Pulmonary fibrosis, unspecified: Secondary | ICD-10-CM

## 2019-05-30 DIAGNOSIS — J849 Interstitial pulmonary disease, unspecified: Secondary | ICD-10-CM

## 2019-05-30 DIAGNOSIS — Z5181 Encounter for therapeutic drug level monitoring: Secondary | ICD-10-CM

## 2019-05-30 DIAGNOSIS — M069 Rheumatoid arthritis, unspecified: Secondary | ICD-10-CM | POA: Diagnosis not present

## 2019-05-30 DIAGNOSIS — J9611 Chronic respiratory failure with hypoxia: Secondary | ICD-10-CM | POA: Diagnosis not present

## 2019-05-30 DIAGNOSIS — Z79899 Other long term (current) drug therapy: Secondary | ICD-10-CM

## 2019-05-30 LAB — CBC WITH DIFFERENTIAL/PLATELET
Basophils Absolute: 0 10*3/uL (ref 0.0–0.1)
Basophils Relative: 0.1 % (ref 0.0–3.0)
Eosinophils Absolute: 0 10*3/uL (ref 0.0–0.7)
Eosinophils Relative: 0.3 % (ref 0.0–5.0)
HCT: 32.5 % — ABNORMAL LOW (ref 39.0–52.0)
Hemoglobin: 10.4 g/dL — ABNORMAL LOW (ref 13.0–17.0)
Lymphocytes Relative: 3 % — ABNORMAL LOW (ref 12.0–46.0)
Lymphs Abs: 0.3 10*3/uL — ABNORMAL LOW (ref 0.7–4.0)
MCHC: 31.9 g/dL (ref 30.0–36.0)
MCV: 97.3 fl (ref 78.0–100.0)
Monocytes Absolute: 0.4 10*3/uL (ref 0.1–1.0)
Monocytes Relative: 5 % (ref 3.0–12.0)
Neutro Abs: 8.1 10*3/uL — ABNORMAL HIGH (ref 1.4–7.7)
Neutrophils Relative %: 91.6 % — ABNORMAL HIGH (ref 43.0–77.0)
Platelets: 280 10*3/uL (ref 150.0–400.0)
RBC: 3.34 Mil/uL — ABNORMAL LOW (ref 4.22–5.81)
RDW: 17.9 % — ABNORMAL HIGH (ref 11.5–15.5)
WBC: 8.8 10*3/uL (ref 4.0–10.5)

## 2019-05-30 LAB — COMPREHENSIVE METABOLIC PANEL
ALT: 36 U/L (ref 0–53)
AST: 35 U/L (ref 0–37)
Albumin: 3.6 g/dL (ref 3.5–5.2)
Alkaline Phosphatase: 30 U/L — ABNORMAL LOW (ref 39–117)
BUN: 13 mg/dL (ref 6–23)
CO2: 29 mEq/L (ref 19–32)
Calcium: 8.4 mg/dL (ref 8.4–10.5)
Chloride: 101 mEq/L (ref 96–112)
Creatinine, Ser: 0.91 mg/dL (ref 0.40–1.50)
GFR: 98.51 mL/min (ref >60.00)
Glucose, Bld: 157 mg/dL — ABNORMAL HIGH (ref 70–99)
Potassium: 3.6 mEq/L (ref 3.5–5.1)
Sodium: 139 mEq/L (ref 135–145)
Total Bilirubin: 0.8 mg/dL (ref 0.2–1.2)
Total Protein: 7 g/dL (ref 6.0–8.3)

## 2019-05-30 MED ORDER — DAPSONE 100 MG PO TABS: 100.0000 mg | ORAL_TABLET | Freq: Every day | ORAL | 1 refills | Status: DC

## 2019-05-30 MED ORDER — TUDORZA PRESSAIR 400 MCG/ACT IN AEPB: 1.0000 | INHALATION_SPRAY | Freq: Two times a day (BID) | RESPIRATORY_TRACT | 0 refills | Status: DC

## 2019-05-30 NOTE — Assessment & Plan Note (Signed)
Plan: Start dapsone Good Rx coupon printed for patient today [name] samples provided today, likely will need to consider Spiriva in the future when [name] samples run out of office

## 2019-05-30 NOTE — Assessment & Plan Note (Signed)
Plan: Continue 2 L of O2 at night

## 2019-05-30 NOTE — Progress Notes (Signed)
Lab work is stable.  Proceed forward with dapsone.  You will need weekly blood work for the next 4 weeks.  Wyn Quaker, FNP

## 2019-05-30 NOTE — Assessment & Plan Note (Signed)
Plan: continue oxygen therapy as prescribed Needs 4 L via POC with physical exertion Continue nocturnal oxygen

## 2019-05-30 NOTE — Patient Instructions (Addendum)
Walk in office today   Labwork today and weekly for 1 month, then monthly for 6 months   Start dapsone 100 mg daily >>> Good Rx coupon provided today  Notify our office if he have any worsening symptoms since starting dapsone, or if you have any rashes, throat swelling, or trouble breathing  Continue [name] Pressair  >>> 1 puff inhaled every 12 hours  >>> do this everyday no matter what  >>>samples provided today   We will get you rescheduled for a pulmonary function test in 4 weeks  Needs Pneumovax23 in October 2020  Continue oxygen therapy as prescribed  - 4L pulsed  >>>maintain oxygen saturations greater than 88 percent  >>>if unable to maintain oxygen saturations please contact the office  >>>do not smoke with oxygen  >>>can use nasal saline gel or nasal saline rinses to moisturize nose if oxygen causes dryness   Return in about 4 weeks (around 06/27/2019), or if symptoms worsen or fail to improve, for Follow up with Wyn Quaker FNP-C, Follow up with Dr. Purnell Shoemaker, Follow up for PFT.   Coronavirus (COVID-19) Are you at risk?  Are you at risk for the Coronavirus (COVID-19)?  To be considered HIGH RISK for Coronavirus (COVID-19), you have to meet the following criteria:  . Traveled to [place], [place], [place], [place] or [place]; or in the [place] to [place], [place], [place], or [place]; and have fever, cough, and shortness of breath within the last 2 weeks of travel OR . Been in close contact with a person diagnosed with COVID-19 within the last 2 weeks and have fever, cough, and shortness of breath . IF YOU DO NOT MEET THESE CRITERIA, YOU ARE CONSIDERED LOW RISK FOR COVID-19.  What to do if you are HIGH RISK for COVID-19?  [name] Kitchen If you are having a medical emergency, call 911. . Seek medical care right away. Before you go to a doctor's office, urgent care or emergency department, call ahead and tell them about your recent travel, contact with someone diagnosed  with COVID-19, and your symptoms. You should receive instructions from your physician's office regarding next steps of care.  . When you arrive at healthcare provider, tell the healthcare staff immediately you have returned from visiting [place], [place], [place], [place] or [place]; or traveled in the [place] to [place], [place], [place], or [place]; in the last two weeks or you have been in close contact with a person diagnosed with COVID-19 in the last 2 weeks.   . Tell the health care staff about your symptoms: fever, cough and shortness of breath. . After you have been seen by a medical provider, you will be either: o Tested for (COVID-19) and discharged home on quarantine except to seek medical care if symptoms worsen, and asked to  - Stay home and avoid contact with others until you get your results (4-5 days)  - Avoid travel on public transportation if possible (such as bus, train, or airplane) or o Sent to the Emergency Department by EMS for evaluation, COVID-19 testing, and possible admission depending on your condition and test results.  What to do if you are LOW RISK for COVID-19?  Reduce your risk of any infection by using the same precautions used for avoiding the common cold or flu:  [name] Kitchen Wash your hands often with soap and warm water for at least 20 seconds.  If soap and water are not readily available, use an alcohol-based hand sanitizer with  at least 60% alcohol.  . If coughing or sneezing, cover your mouth and nose by coughing or sneezing into the elbow areas of your shirt or coat, into a tissue or into your sleeve (not your hands). . Avoid shaking hands with others and consider head nods or verbal greetings only. . Avoid touching your eyes, nose, or mouth with unwashed hands.  . Avoid close contact with people who are sick. . Avoid places or events with large numbers of people in one location, like concerts or sporting events. . Carefully consider travel plans you have  or are making. . If you are planning any travel outside or inside the [place], visit the CDC's Travelers' Health webpage for the latest health notices. . If you have some symptoms but not all symptoms, continue to monitor at home and seek medical attention if your symptoms worsen. . If you are having a medical emergency, call 911.   [place] / e-Visit: [email or web address]         [facility]: [facility]: [phone]                   [facility]: [phone]           It is flu season:   >>> Best ways to protect herself from the flu: Receive the yearly flu vaccine, practice good hand hygiene washing with soap and also using hand sanitizer when available, eat a nutritious meals, get adequate rest, hydrate appropriately   Please contact the office if your symptoms worsen or you have concerns that you are not improving.   Thank you for choosing [facility] for your healthcare, and for allowing [place] to partner with you on your healthcare journey. I am thankful to be able to provide care to you today.   Wyn Quaker FNP-C   Dapsone tablets What is this medicine? DAPSONE (DAP sone) is an antiinfective. It is used to treat a skin problem called Dermatitis herpetiformis. This medicine is also used to treat leprosy. This medicine may be used for other purposes; ask your health care provider or pharmacist if you have questions. What should I tell my health care provider before I take this medicine? They need to know if you have any of these conditions:  anemia  diabetes  glucose 6-phosphate dehydrogenase (G6PD) deficiency  liver disease  an unusual or allergic reaction to dapsone, sulfa drugs, other medicines, foods, dyes, or preservatives  pregnant or trying to get pregnant  breast-feeding How should I use this  medicine? Take this medicine by mouth with a glass of water. Follow the directions on the prescription label. Take your medicine at regular intervals. Do not take your medicine more often than directed. Do not skip doses or stop your medicine early even if you feel better. Do not stop taking except on your doctor's advice. Talk to your pediatrician regarding the use of this medicine in children. Special care may be needed. Overdosage: If you think you have taken too much of this medicine contact a poison control center or emergency room at once. NOTE: This medicine is only for you. Do not share this medicine with others. What if I miss a dose? If you miss a dose, take it as soon as you can. If it is almost time for your next dose, take only that dose. Do not take double or extra doses. What may interact with this medicine?  fluorouracil, 5-FU  methotrexate  pyrimethamine  rifampin  trimethoprim  trimetrexate This list may not describe all possible interactions. Give your health care provider a list of all the medicines, herbs, non-prescription drugs, or dietary supplements you use. Also tell them if you smoke, drink alcohol, or use illegal drugs. Some items may interact with your medicine. What should I watch for while using this medicine? Visit your doctor or health care provider for regular check ups. Tell your doctor if your symptoms do not improve. This medicine may cause serious skin reactions. They can happen weeks to months after starting the medicine. Contact your health care provider right away if you notice fevers or flu-like symptoms with a rash. The rash may be red or purple and then turn into blisters or peeling of the skin. Or, you might notice a red rash with swelling of the face, lips or lymph nodes in your neck or under your arms. You may need to be on a special diet while taking this medicine. Ask your doctor. This medicine can make you more sensitive to the sun. Keep out  of the sun. If you cannot avoid being in the sun, wear protective clothing and use sunscreen. Do not use sun lamps or tanning beds/booths. What side effects may I notice from receiving this medicine? Side effects that you should report to your doctor or health care professional as soon as possible:  allergic reactions like skin rash, itching or hives, swelling of the face, lips, or tongue  bluish fingernails or lips  changes in vision, hearing  dark urine  fast, irregular heartbeat  fever  joint swelling  more or less urine passed  muscle weakness  pale skin  rash, fever, and swollen lymph nodes  tingling, pain, or numbness in the hands or feet  unusual bleeding, bruising  unusually weak or tired  yellowing of the eyes or skin Side effects that usually do not require medical attention (report to your doctor or health care professional if they continue or are bothersome):  headache  loss of appetite  nausea, vomiting  stomach pain  trouble sleeping This list may not describe all possible side effects. Call your doctor for medical advice about side effects. You may report side effects to FDA at 1-800-FDA-1088. Where should I keep my medicine? Keep out of the reach of children. Store at room temperature between 20 and 25 degrees C (68 and 77 degrees F). Protect from light. Keep container closed tightly. Throw away any unused medicine after the expiration date. NOTE: This sheet is a summary. It may not cover all possible information. If you have questions about this medicine, talk to your doctor, pharmacist, or health care provider.  2020 Elsevier/Gold Standard (2019-01-19 12:42:24)

## 2019-05-30 NOTE — Assessment & Plan Note (Signed)
Plan: Continue follow-up with rheumatology Continue forward with planned Rituxan infusion on 06/01/2019 Continue Imuran 20 prednisone 3 mg daily Start dapsone

## 2019-05-30 NOTE — Assessment & Plan Note (Signed)
Starting dapsone 05/30/2019 G6PD- 14.4 normal  December/2019  Plan: Lab work today Weekly CBC for 4 weeks, then monthly CBC for 6 months, then CBC every 6 months LFTs today

## 2019-05-30 NOTE — Patient Outreach (Signed)
[facility]) Care Management  [place] 05/30/2019  [name] Oct 13, 1946 [id number]  Communication from pulmonary office that patient will continue [name] for now as he was able to procure 3 months of samples from office.  He will likely need to transition to Spiriva in October if no further samples available.  Patient assistance program for Spiriva may take several weeks therefore I will contact patient in September to start paperwork.   Successful call to patient to discuss plan for inhalers.  Patient voiced understanding.  No further questions at this time.   Plan: F/u with patient to mail Spiriva patient assistance program application in September  Ralene Bathe, PharmD, [place] [phone]

## 2019-05-30 NOTE — Assessment & Plan Note (Addendum)
Plan: Continue follow-up with rheumatology Continue Imuran, Rituxan infusions every 6 months, prednisone 3 mg daily Pulmonary function test in 4 weeks Continue [name] Continue nocturnal oxygen Could consider anti-fibrotic's in the future based off of pulmonary function test, currently stable severe pulmonary fibrosis on CT imaging Start dapsone Pneumovax 23 in October/2020

## 2019-05-31 ENCOUNTER — Ambulatory Visit: Payer: Self-pay | Admitting: Pharmacist

## 2019-06-01 ENCOUNTER — Other Ambulatory Visit: Payer: Self-pay

## 2019-06-01 ENCOUNTER — Other Ambulatory Visit: Payer: Self-pay | Admitting: *Deleted

## 2019-06-01 ENCOUNTER — Encounter: Payer: Self-pay | Admitting: *Deleted

## 2019-06-01 DIAGNOSIS — M0589 Other rheumatoid arthritis with rheumatoid factor of multiple sites: Secondary | ICD-10-CM | POA: Diagnosis not present

## 2019-06-01 NOTE — Patient Outreach (Signed)
Steven Steven) Care Management  06/01/2019  Steven Steven 05-25-1946 [id number]   Telephone Screen  Referral Date: 06/01/19 Medium Referral Source: [facility] UM referral  Referral Reason: [name] immunocompromise and on Rituxan and Imuran for treatment. Member has a hx of chronic lung disease. Pulmonary fibrosis/ILD ad RA who went to ER x 2 in the past week with sob. Please follow up with the member for frequent ER visits Steven Steven 05/31/19  Insurance: united health care medicare  ED visits on 05/23/19 fever, URI& 05/26/19 abdominal pain, sob, cough dx pulmonary fibrosis Last admission 5 years ago on 12/15/11 for CAP Went to MD this am   Outreach attempt # 1 successful at the mobile number  Patient is able to verify HIPAA Reviewed and addressed referral to [facility] Steven Steven with patient  Steven Steven informs [facility] RN CM he is doing better since he was started on O2 at 4 L all day vs only at night. "I feel good"  Steven Steven noted without audible sob or difficulty during today's call.   Steven Steven reports he believes that his 2 ED visits were both related to him having low oxygen saturations in the 80s. [name] aware it needs to stay above 90% He reports now with the use of 4 L O2 he stays 92-93% he denies being tired or not sleeping well  Steven Steven and CM discussed various causes of pulmonary fibrosis/ILD. He states he has been on oxygen since he had PNA admission. CM discussed his years of working in the [employer] and smoking hx also   He denies need for other [name] except [facility] pharmacy who is assisting with medications. He denies offers for [name], SW or telephonic RN CM monitoring   He denied need for education information for pulmonary fibrosis, HTN and DM  He reports his cbg were elevated only related to the steroid use He and CM did discuss hypoglycemia. He confirms he holds his hs insulin if his cbg is low to now prevent hypoglycemia concerns He denies s/s of covid and confirms a  recent negative covid test He reports staying at home as much as possible and having PPE supplies when out to MD offices  Transition of care assessment completed   Social:   Steven Steven is retired from working at a [employer], [name] and lives at home with his wife, Steven Steven, They have a small dog. He reports he is independent with his care needs and transportation to medical appointment. He reports if needed his wife will assist. He reports staying home and likes watching tv.    Conditions: pulmonary fibrosis, PNA, chronic respiratory  Failure with hypoxia, hypothyroidism, RA, obesity, nodule right lund, hx of a drug reaction, HTN, DM, Coronary artery calcification, former smoker (quit in 1986), negative SARS-CoV-2 test on 05/26/2019   Fall none, [facility] RN CM discussed fall prevention related to long, extension of oxygen tubing   DME:  4 L oxygen all day 24 hours/7 days a week since last pulmonologist appointment, Long or extension of oxygen tubing, Oxygen supplies provided by [facility] to include also a concentrator and 2 portable tanks. BP monitor, cbg monitor, pulse oximeter   [facility] RN CM discussed cleaning of his nares, cleaning and exchanging of oxygen tubing to prevent irritants or bacteria and encouraged him to be active with some breathing exercises. CM discussed the importance of exercise and lung exercises.He voiced understanding. He reports he may have an incentive spirometry  in the home but would have to find it He reports receiving breathing t treatments he reports that he will return to MD to have breathing evaluation in 2-3 weeks  Medications: CM noted in Epic on 05/30/19 Steven Steven is already active with [place] for medication assistance with Spiriva He confirms he has not received the medication assistance forms yet but will fill them out and return them later. He mentions x 2 that he was denied once already" [facility] RN CM provided encouragement to complete the forms  once again  [name] samples from his pulmonary MD office  He denies concerns with taking medications as prescribed, side effects of medications and questions about medications  Appointments: 05/30/19 seen by Pulmonology NP Wyn Quaker, generally seen monthly He reports he is seeing a MD very week for the last month   Dr Merrilee Seashore, primary care MD, seen last on 05/26/19 MD sent him to ED  f/u in September 2020 - Manages his blood sugars He reports his HgA1c was "good" [facility] RN CM notes in [place] that his 01/31/19 HgA1c was 6.5%  Advance Directives: none He denies need for assist with advance directives  He ws encouraged to call if assistance needed to complete prn    Consent: [facility] RN CM reviewed [facility] services with patient. Patient gave verbal consent for services [place] [facility] telephonic RN CM and [facility] pharmacy.   Plan: [facility] RN CM will close case at this time as patient has been assessed and no needs identified/needs resolved.   Pt encouraged to return a call to [place] CM prn  [facility] RN CM sent a successful outreach letter as discussed with [facility] brochure enclosed for review  Routed note to MDs/NP   Gambier. Lavina Hamman, RN, BSN, [place] Coordinator Office number [phone] Mobile number [phone]  Main [facility] number [phone] Fax number [phone]

## 2019-06-03 DIAGNOSIS — J129 Viral pneumonia, unspecified: Secondary | ICD-10-CM | POA: Diagnosis not present

## 2019-06-03 DIAGNOSIS — R0902 Hypoxemia: Secondary | ICD-10-CM | POA: Diagnosis not present

## 2019-06-07 ENCOUNTER — Other Ambulatory Visit (INDEPENDENT_AMBULATORY_CARE_PROVIDER_SITE_OTHER): Payer: Medicare Other

## 2019-06-07 DIAGNOSIS — J849 Interstitial pulmonary disease, unspecified: Secondary | ICD-10-CM

## 2019-06-07 LAB — CBC WITH DIFFERENTIAL/PLATELET
Basophils Absolute: 0 10*3/uL (ref 0.0–0.1)
Basophils Relative: 0.5 % (ref 0.0–3.0)
Eosinophils Absolute: 0 10*3/uL (ref 0.0–0.7)
Eosinophils Relative: 0 % (ref 0.0–5.0)
HCT: 29 % — ABNORMAL LOW (ref 39.0–52.0)
Hemoglobin: 9.3 g/dL — ABNORMAL LOW (ref 13.0–17.0)
Lymphocytes Relative: 4.4 % — ABNORMAL LOW (ref 12.0–46.0)
Lymphs Abs: 0.3 10*3/uL — ABNORMAL LOW (ref 0.7–4.0)
MCHC: 32.3 g/dL (ref 30.0–36.0)
MCV: 96.7 fl (ref 78.0–100.0)
Monocytes Absolute: 0.5 10*3/uL (ref 0.1–1.0)
Monocytes Relative: 7.3 % (ref 3.0–12.0)
Neutro Abs: 5.5 10*3/uL (ref 1.4–7.7)
Neutrophils Relative %: 87.8 % — ABNORMAL HIGH (ref 43.0–77.0)
Platelets: 208 10*3/uL (ref 150.0–400.0)
RBC: 3 Mil/uL — ABNORMAL LOW (ref 4.22–5.81)
RDW: 19.1 % — ABNORMAL HIGH (ref 11.5–15.5)
WBC: 6.2 10*3/uL (ref 4.0–10.5)

## 2019-06-13 ENCOUNTER — Other Ambulatory Visit (INDEPENDENT_AMBULATORY_CARE_PROVIDER_SITE_OTHER): Payer: Medicare Other

## 2019-06-13 DIAGNOSIS — J849 Interstitial pulmonary disease, unspecified: Secondary | ICD-10-CM | POA: Diagnosis not present

## 2019-06-13 LAB — CBC WITH DIFFERENTIAL/PLATELET
Basophils Absolute: 0 10*3/uL (ref 0.0–0.1)
Basophils Relative: 0.4 % (ref 0.0–3.0)
Eosinophils Absolute: 0 10*3/uL (ref 0.0–0.7)
Eosinophils Relative: 0 % (ref 0.0–5.0)
HCT: 29.6 % — ABNORMAL LOW (ref 39.0–52.0)
Hemoglobin: 9.5 g/dL — ABNORMAL LOW (ref 13.0–17.0)
Lymphocytes Relative: 8 % — ABNORMAL LOW (ref 12.0–46.0)
Lymphs Abs: 0.3 10*3/uL — ABNORMAL LOW (ref 0.7–4.0)
MCHC: 32 g/dL (ref 30.0–36.0)
MCV: 96.4 fl (ref 78.0–100.0)
Monocytes Absolute: 0.3 10*3/uL (ref 0.1–1.0)
Monocytes Relative: 7.6 % (ref 3.0–12.0)
Neutro Abs: 3.6 10*3/uL (ref 1.4–7.7)
Neutrophils Relative %: 84 % — ABNORMAL HIGH (ref 43.0–77.0)
Platelets: 237 10*3/uL (ref 150.0–400.0)
RBC: 3.07 Mil/uL — ABNORMAL LOW (ref 4.22–5.81)
RDW: 20.1 % — ABNORMAL HIGH (ref 11.5–15.5)
WBC: 4.3 10*3/uL (ref 4.0–10.5)

## 2019-06-13 NOTE — Progress Notes (Signed)
Lab work stable.  Please continue to obtain lab work weekly as initially outlined.  Wyn Quaker, FNP

## 2019-06-13 NOTE — Progress Notes (Signed)
Stable labs.  Continue forward with dapsone.  Wyn Quaker, FNP

## 2019-06-15 ENCOUNTER — Telehealth: Payer: Self-pay | Admitting: Pulmonary Disease

## 2019-06-15 NOTE — Telephone Encounter (Signed)
Returned call to patient.  States he takes the dapsone in the morning and feels nauseated about an hour later and nausea is on and off throughout the day. Not severe nausea and no vomiting but feels it is from the dapsone.  Patient takes meds 'first thing in the morning' and does not eat until a little later. Advised to try eating first and take dapsone about 30 minutes after eating to see if this helps. Patient is not requesting anything at this time, just wanted to inform us.  Will route this message to B. Warner Mccreedy, NP for review and any further suggestions.

## 2019-06-15 NOTE — Telephone Encounter (Signed)
Agree take with food. Can discuss further with MR at [place] later on this month.   Aaron Edelman

## 2019-06-16 NOTE — Telephone Encounter (Signed)
Called and spoke with pt letting him know the info stated by Aaron Edelman that he agreed the dapsone should be taken with food. Pt verbalized understanding. Nothing further needed.

## 2019-06-20 ENCOUNTER — Other Ambulatory Visit: Payer: Self-pay | Admitting: *Deleted

## 2019-06-20 ENCOUNTER — Other Ambulatory Visit (INDEPENDENT_AMBULATORY_CARE_PROVIDER_SITE_OTHER): Payer: Medicare Other

## 2019-06-20 DIAGNOSIS — J849 Interstitial pulmonary disease, unspecified: Secondary | ICD-10-CM | POA: Diagnosis not present

## 2019-06-20 DIAGNOSIS — Z5181 Encounter for therapeutic drug level monitoring: Secondary | ICD-10-CM

## 2019-06-20 LAB — CBC WITH DIFFERENTIAL/PLATELET
Basophils Absolute: 0 10*3/uL (ref 0.0–0.1)
Basophils Relative: 0.6 % (ref 0.0–3.0)
Eosinophils Absolute: 0 10*3/uL (ref 0.0–0.7)
Eosinophils Relative: 0 % (ref 0.0–5.0)
HCT: 28.6 % — ABNORMAL LOW (ref 39.0–52.0)
Hemoglobin: 9.2 g/dL — ABNORMAL LOW (ref 13.0–17.0)
Lymphocytes Relative: 13.8 % (ref 12.0–46.0)
Lymphs Abs: 0.5 10*3/uL — ABNORMAL LOW (ref 0.7–4.0)
MCHC: 32.1 g/dL (ref 30.0–36.0)
MCV: 99 fl (ref 78.0–100.0)
Monocytes Absolute: 0.3 10*3/uL (ref 0.1–1.0)
Monocytes Relative: 8.9 % (ref 3.0–12.0)
Neutro Abs: 2.6 10*3/uL (ref 1.4–7.7)
Neutrophils Relative %: 76.7 % (ref 43.0–77.0)
Platelets: 307 10*3/uL (ref 150.0–400.0)
RBC: 2.89 Mil/uL — ABNORMAL LOW (ref 4.22–5.81)
RDW: 21.9 % — ABNORMAL HIGH (ref 11.5–15.5)
WBC: 3.4 10*3/uL — ABNORMAL LOW (ref 4.0–10.5)

## 2019-06-20 NOTE — Progress Notes (Signed)
Hepatic function added to lab

## 2019-06-20 NOTE — Progress Notes (Signed)
Lab work stable.  Please make sure patient completes another CBC with differential next week when he sees Dr. Chase Caller.  Please also place an order for hepatic function panel under therapeutic drug monitoring for patient to complete as well when he sees Dr. Chase Caller next week.  Wyn Quaker, FNP

## 2019-06-27 ENCOUNTER — Other Ambulatory Visit (INDEPENDENT_AMBULATORY_CARE_PROVIDER_SITE_OTHER): Payer: Medicare Other

## 2019-06-27 DIAGNOSIS — J849 Interstitial pulmonary disease, unspecified: Secondary | ICD-10-CM | POA: Diagnosis not present

## 2019-06-27 DIAGNOSIS — Z5181 Encounter for therapeutic drug level monitoring: Secondary | ICD-10-CM

## 2019-06-27 LAB — CBC WITH DIFFERENTIAL/PLATELET
Basophils Absolute: 0 10*3/uL (ref 0.0–0.1)
Basophils Relative: 0.5 % (ref 0.0–3.0)
Eosinophils Absolute: 0 10*3/uL (ref 0.0–0.7)
Eosinophils Relative: 0 % (ref 0.0–5.0)
HCT: 26.5 % — ABNORMAL LOW (ref 39.0–52.0)
Hemoglobin: 8.7 g/dL — ABNORMAL LOW (ref 13.0–17.0)
Lymphocytes Relative: 10.7 % — ABNORMAL LOW (ref 12.0–46.0)
Lymphs Abs: 0.3 10*3/uL — ABNORMAL LOW (ref 0.7–4.0)
MCHC: 32.7 g/dL (ref 30.0–36.0)
MCV: 101.5 fl — ABNORMAL HIGH (ref 78.0–100.0)
Monocytes Absolute: 0.3 10*3/uL (ref 0.1–1.0)
Monocytes Relative: 9.6 % (ref 3.0–12.0)
Neutro Abs: 2.4 10*3/uL (ref 1.4–7.7)
Neutrophils Relative %: 79.2 % — ABNORMAL HIGH (ref 43.0–77.0)
Platelets: 303 10*3/uL (ref 150.0–400.0)
RBC: 2.61 Mil/uL — ABNORMAL LOW (ref 4.22–5.81)
RDW: 23.5 % — ABNORMAL HIGH (ref 11.5–15.5)
WBC: 3 10*3/uL — ABNORMAL LOW (ref 4.0–10.5)

## 2019-06-27 LAB — HEPATIC FUNCTION PANEL
ALT: 14 U/L (ref 0–53)
AST: 21 U/L (ref 0–37)
Albumin: 3.7 g/dL (ref 3.5–5.2)
Alkaline Phosphatase: 43 U/L (ref 39–117)
Bilirubin, Direct: 0.3 mg/dL (ref 0.0–0.3)
Total Bilirubin: 0.9 mg/dL (ref 0.2–1.2)
Total Protein: 6.4 g/dL (ref 6.0–8.3)

## 2019-06-27 NOTE — Progress Notes (Signed)
Please contact this patient and let him know that we will continue him on dapsone at this time.  I will discuss his blood work with Dr. Chase Caller.  He needs to keep his scheduled follow-up with Dr. Chase Caller later on this week.  Will route to MR as FYI -MR please look at the blood work.  Patient started on dapsone about 4 weeks ago.  WBC is trending down.  May need to consider different agent or try Bactrim again. Pt coming to see you on 06/29/2019  Wyn Quaker, FNP

## 2019-06-29 ENCOUNTER — Telehealth: Payer: Self-pay | Admitting: Internal Medicine

## 2019-06-29 ENCOUNTER — Ambulatory Visit: Payer: Medicare Other | Admitting: Internal Medicine

## 2019-06-29 ENCOUNTER — Encounter: Payer: Self-pay | Admitting: Internal Medicine

## 2019-06-29 ENCOUNTER — Other Ambulatory Visit: Payer: Self-pay

## 2019-06-29 VITALS — BP 146/68 | HR 94 | Temp 98.7°F | Ht 66.0 in | Wt 181.8 lb

## 2019-06-29 DIAGNOSIS — J849 Interstitial pulmonary disease, unspecified: Secondary | ICD-10-CM

## 2019-06-29 DIAGNOSIS — I2510 Atherosclerotic heart disease of native coronary artery without angina pectoris: Secondary | ICD-10-CM | POA: Diagnosis not present

## 2019-06-29 DIAGNOSIS — Z5181 Encounter for therapeutic drug level monitoring: Secondary | ICD-10-CM

## 2019-06-29 DIAGNOSIS — D849 Immunodeficiency, unspecified: Secondary | ICD-10-CM

## 2019-06-29 DIAGNOSIS — D899 Disorder involving the immune mechanism, unspecified: Secondary | ICD-10-CM

## 2019-06-29 DIAGNOSIS — J8410 Pulmonary fibrosis, unspecified: Secondary | ICD-10-CM | POA: Diagnosis not present

## 2019-06-29 NOTE — Telephone Encounter (Signed)
  [name]  Forgot but his wbc count is going down since starting dapsoine a month ago  Plan  - please call him and let him and also wife  - tht he should stop dapsone  - will check cbc at his followup in 1-2 months    SIGNATURE    Dr. Brand Males, [name], [name],  Pulmonary and Critical Care Medicine Staff Physician, [place] Director - Interstitial Lung Disease  Program  Pulmonary [place] at [place], [place], [address]  Pager: [phone], If no answer or between  15:00h - 7:00h: call [phone] Telephone: [phone]  10:22 AM 06/29/2019  g  Results for BRODIN, GELPI (MRN [id number]) as of 06/29/2019 10:19  Ref. Range 05/30/2019 10:17 06/07/2019 09:07 06/13/2019 08:59 06/20/2019 09:50  WBC Latest Ref Range: 4.0 - 10.5 K/uL 8.8 6.2 4.3 3.4 (L)

## 2019-06-29 NOTE — Patient Instructions (Addendum)
ICD-10-CM   1. 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 Immunosuppressed status ([place])  D89.9   3. Coronary artery calcification seen on CAT scan  I25.10    Your shortness of breath has worsened because of the fact you are requiring more oxygen This is a little bit surprising because the CT scan of the chest is stable between 2018 and 2020 July    -Need to identify the reason for worsening which could be heart related or progressive scar tissue in the lung and also rare infections in the lung although this is unlikely but possible given you immunosuppressed status   PLan -refer cardiology for echo (worsening shortness of breatth) and coronary artrery calcification -Spirometry and DLCO  Followup - 30 min ILD visit 1-2 months - but after completing above; based on above tests we might have to consider bronchoscopy or add ofev for fibrosis

## 2019-06-29 NOTE — Telephone Encounter (Signed)
Called and spoke with patient wife [name] and advised of Dr. Golden Pop direction to stop taking Dapsone. Advised he wants him to have lab work at the next office visit.  Order for lab work added.   She voiced understanding, nothing further needed at this time.

## 2019-06-29 NOTE — Progress Notes (Signed)
Will sign off. Pt was seen in office 06/29/19 by Dr. Chase Caller.

## 2019-06-29 NOTE — Progress Notes (Addendum)
OV 01/01/2017  Chief Complaint  Patient presents with   Follow-up    Pt here after PFT. Pt states overall his breathing is unchanged since last OV. Pt c/o occ dry cough. Pt deneis CP/tightness and f/c/s.    Follow-up interstitial lung disease UIP pattern associated with rheumatoid arthritis and immune suppression. Last visit in March 2017. He doesn't that overall he has been stable. He has a new rheumatologist but still at [facility] Dr [name]. His wife also says he is stable. However as he started talking to them his wife slowly started telling me that for the last few to several months he is wheezing more than usual and is a little more short of breath than usual. However he denied it. This is compliant with his medications. [name] not on any inhaler therapy. He uses oxygen only at night. Previous CT scans of the chest in September 2016 in March 2017 did not report any associated emphysema. His most recent pulmonary function test is shown that on Combivent. I personally visualized the graph: [name] Kitchen Forced vital capacity is stable with a diffusion capacity seems to decline over time. Walking desaturation test 185 feet 3 laps on room air: He desaturated to 84%. And then we walked him on 2 Steven oxygen he did not desaturate.   OV 06/23/2017  Chief Complaint  Patient presents with   Follow-up    pt here after CT in 04/2017. Pt states his breathing is unchanged since last OV. Pt c/o interemittent dry cough. Pt denies CP/tightness and f/c/s.      Follow-up interstitial lung disease UIP pattern associated with rheumatoid arthritis and on Imuran. Last visit in March 2018 I was concerned about progressive interstitial lung disease because he started desaturating on room air and his FVC show decline. Currently rated a high-resolution CT chest that confirmed progressive lung disease. We walked him 185 feet 3 laps on room air and he desaturated at third lap to 89%. This is very  similar to previous visit suggesting that  that his decline has been more gradual over the last 1 year. Compared to last visit he feels stable. His wife feels the same. [name] aware of his worsening fibrosis and the potential need to change anti fibrotics  OV 09/27/2017  Chief Complaint  Patient presents with   Follow-up    PFT done today. Pt states that he thinks his breathing has become worse. Has complaints of SOB with exertion, occ. dry cough. Denies any CP. States that he does wear O2 at night, DME: [facility].     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  Patient is on Imuran.  [name] having progressive disease this year 2018.  He told my certified medical assistant that he is having progressive shortness of breath but to me he tells me that he is stable.  As always he is a very poor historian.  Wife gives a better history.  He has oxygen at home for the night which she uses but does not use his exertional small portable system even though this helps him.  They do admit that dyspnea is progressive.  In fact pulmonary function test today shows progressive disease.  He also seem to desaturate a little bit more easily.  He continues to be on Imuran.  In talking to him it appears his rheumatologist has now started him on Rituxan.  He has had 2 doses 2 weeks apart  with the most recent one being mid November 2018.  The next dose is at mid May 2019 which is 67-monthinterval.  He says he is up-to-date with his flu shot but is not had a conversation with his primary care physician about the new shingles vaccine that is inactivated vaccine.  [name] not having much of joint issues.  [name] aware that the Rituxan is specifically designed to slow down the progression of pulmonary fibrosis.  Walking desaturation test 185 feet x3 laps on room air with a forehead probe.  Resting heart rate was 95/min.  Final heart rate 108/min.  Resting pulse ox was 95%.  By the time he  finished his second lap he desaturated to 84%.  He was then placed on 2 Steven nasal cannula and he was able to maintain his saturation to 95%.   OV 12/20/2017  Chief Complaint  Patient presents with   Follow-up    follow up PFT,SOB with activity/exertion   [name] is here for follow-up because of his progressive UIP pattern pulmonary fibrosis related to rheumatoid arthritis.  At his last visit he was started on Rituxan by rheumatology.  Since then we started him on portable oxygen but he says he is not using it because he does not like to use it.  He does use oxygen at night which is helping.  His next dose of Rituxan is in May 2019.  Both he and his wife attest that since the start of Rituxan his symptoms are stable particularly shortness of breath is not any worse.  In fact pulmonary function test today shows continued ongoing stability with FVC and DLCO would suggest that the Rituxan might be helping him.  There are no new issues.  [name] here to have a shingles vaccine.  I told him about this last visit to talk to primary care physician but he does not remember this.   IMPRESSION: June 2018 1. Continued interval progression of fibrotic interstitial lung disease with extensive honeycombing, asymmetrically involving the right lung, with mild basilar predominance, diagnostic of usual interstitial pneumonia (UIP) . 2. Stable mild reactive mediastinal lymphadenopathy. 3. Aortic atherosclerosis.  One vessel coronary atherosclerosis.   Electronically Signed   By: [name]   On: 04/09/2017 10:51   OV 08/01/2018  Subjective:  Patient ID: LVijay [name] , DOB: 604-10-1946, age 73y.o. , MRN: [id number], [place] [id number]  08/01/2018 -   Chief Complaint  Patient presents with   Follow-up    PFT today, increased coughing (non-productive)    Follow-up interstitial lung disease UIP pattern associated with rheumatoid arthritis. Rx Rituxan since approx  Oct/Nov 2018. Background Immuran  HPI LAabanScales 73y.o. -presents as usual with his wife.  He tells me that he continues on background Imuran.  [name] also on scheduled Rituxan.  He has an infusion coming up in a month or so.  This is according to his wife.  He continues to be a poor historian.  As best as I can gather from both of them is that his shortness of breath is actually a little bit better ever since starting Rituxan..  In fact his PFTs show continued improvement/stability.  With his FVC and DLCO.  However he tells me that his cough might be a little bit worse.  In talking to him it appears that is only mildly worse.  It happens mostly at night when he lies down.  He coughs a few times and then it does not bother him.  The cough is dry.  He denies any postnasal drip or acid reflux disease.  He does not have any fever or sputum production.       OV 10/06/2018  Subjective:  Patient ID: [name], male , DOB: [birth year] , age 54 y.o. , MRN: [id number] , ADDRESS: [place] [address]   10/06/2018 -   Chief Complaint  Patient presents with   Follow-up    f/u pulmonary fibrosis, no symptoms, complaints, breathing doind well     HPI [name] 73 y.o. -returns for ILD follow-up in the setting of rheumatoid arthritis.  This time he is not here with his wife who has other schedule conflicts.  He tells me overall he stable.  He has now started Rituxan.  As again he is a poor historian.  I have him starting Rituxan a year ago but he told me that it was only started recently and has had only 1 infusion.  He tells me that he is stable.  His cough is well controlled with this anticholinergic he is using nighttime oxygen.  He says he is compliant with his medications.  Last visit I told him to inquire about using Bactrim prophylaxis in the setting of B-cell depletion in the setting of Rituxan.  He has no idea recollection of this conversation.  So I did tell him that we will check  his G6PD and discuss directly with his rheumatologist about starting Bactrim prophylaxis.  [name] okay with this plan.  Overall dyspnea and cough are stable and it is mild.  Walking desaturation test shows stability.     OV 06/29/2019  Subjective:  Patient ID: [name], male , DOB: 1946/06/28 , age 83 y.o. , MRN: [id number] , ADDRESS: [address] [place] [address]   Follow-up interstitial lung disease UIP pattern associated with rheumatoid arthritis. Rx Rituxan since approx Oct/Nov 2018. On Background Immuran, prednisone and dapsone for pcp prophylaxois = high risk medication use   06/29/2019 - ILD- RA  followup    HPI [name] 73 y.o. -presents for follow-up.  I personally saw him December 2019 before the onset of the pandemic.  He tells me that he is doing fair.  He feels he is overall stable.  He continues to be on Rituxan.  He tells me the last dose of this infusion was in May 05, 2019.  This is managed by rheumatology.  In addition he is on Imuran, prednisone.  Also by middle of year my nurse practitioner started him on dapsone for PCP prophylaxis.  He did have a high-resolution CT chest that shows UIP findings and stability since 2018.  However, he did desaturate with exertion when he saw the nurse practitioner just over a month ago.  This is a new finding as documented below.  We walked him again today and again found his desaturation with exertion.  It required 5 Steven to correct.  In past he has done well with RA at times but at times required 2L. He denies any fever or sputum production.  His previous echocardiogram was in 2013.  Most recent CT scan of the chest notes coronary artery calcification   Noted WBC going down after dapsine start Results for JUDEA, RICHES (MRN [id number]) as of 06/29/2019 10:19  Ref. Range 05/30/2019 10:17 06/07/2019 09:07 06/13/2019 08:59 06/20/2019 09:50  WBC Latest Ref Range: 4.0 - 10.5 K/uL 8.8 6.2 4.3  3.4 (Steven)    SYMPTOM SCALE - ILD 06/29/2019   O2 use  Uses 3-5L with exertion  Shortness of Breath 0 -> 5 scale with 5 being worst (score 6 If unable to do)  At rest 3  Simple tasks - showers, clothes change, eating, shaving 2  Household (dishes, doing bed, laundry) 2  Shopping 2  Walking level at own pace 2  Walking keeping up with others of same age 58  Walking up Stairs 3  Walking up [place] 4  Total (40 - 48) Dyspnea Score 20  How bad is your cough? 3  How bad is your fatigue 2       Results for NIKOLAY, DEMETRIOU (MRN [id number]) as of 09/27/2017 10:06  Ref. Range 01/23/2014 15:38 10/01/2014 11:24 07/29/2015 11:09 12/18/2016 09:34 09/27/2017 08:44 Start Rituxan 12/20/2017  08/01/2018   FVC-Pre Latest Units: Steven 2.18 2.05 2.17 2.21 2.06 2.1 2.09  FVC-%Pred-Pre Latest Units: % 66 62 66 68 64 65% 69%  Results for NAMON, VILLARIN (MRN [id number]) as of 09/27/2017 10:06  Ref. Range 01/23/2014 15:38 10/01/2014 11:24 07/29/2015 11:09 12/18/2016 09:34 09/27/2017 08:44 12/20/2017  08/01/2018   DLCO unc Latest Units: ml/min/mmHg 17.93 16.86 16.72 13.81 12.14 12.3 13.24  DLCO unc % pred Latest Units: % 66 62 62 51 45 45% 52%     Simple office walk 08/01/2018 -  185 feet x  3 laps goal with forehead probe at [place]  10/06/2018 250 feet x 3 laps 06/29/2019   O2 used Room air Room air Room air  Number laps completed 3 3 Aim for 3 1  Comments about pace normal normal   Resting Pulse Ox/HR 100% and 80/min 100% and 81/min 94% and 96/min  Final Pulse Ox/HR 94% and 114/min 91% and 111/min 86% at first lap, HR 131  Desaturated </= 88% no no   Desaturated <= 3% points yes yes   Got Tachycardic >/= 90/min yes yes   Symptoms at end of test x x   Miscellaneous comments x x Needed 5L [place] to correct   IMPRESSION: HRCT July 2020 1. No significant interval change in severe pulmonary fibrosis, which is heterogeneously distributed and most severe in the right lung base, featuring extensive honeycombing and mild traction bronchiectasis. Although findings are not  significantly changed compared to most recent examination dated 2018, they are clearly progressed over time in comparison to priors dating back to 2013. Findings remain in a UIP pattern by ATS pulmonary fibrosis criteria  2.  Coronary artery disease.   Electronically Signed   By: Eddie Candle [name]   On: 05/12/2019 13:14  ROS - per HPI     has a past medical history of Arthritis, Diabetes mellitus, Hypertension, Pneumonia, and Thyroid disease.   reports that he quit smoking about 34 years ago. His smoking use included cigarettes. He has a 5.00 pack-year smoking history. He has never used smokeless tobacco.  Past Surgical History:  Procedure Laterality Date   KNEE ARTHROSCOPY     NO PAST SURGERIES      Allergies  Allergen Reactions   Bactrim [Sulfamethoxazole-Trimethoprim]     Immunization History  Administered Date(s) Administered   Influenza Split 08/03/2011, 07/03/2012, 09/02/2013, 07/03/2014   Influenza, High Dose Seasonal PF 08/02/2016, 08/02/2017, 07/25/2018   Influenza,inj,Quad PF,6+ Mos 07/29/2015   Pneumococcal Conjugate-13 08/01/2018   Pneumococcal Polysaccharide-23 11/03/2007   Zoster Recombinat (Shingrix) 04/11/2019    Family History  Problem Relation Age of Onset   Diabetes Mother  Stroke Mother      Current Outpatient Medications:    Aclidinium Bromide ([name] PRESSAIR) 400 MCG/ACT AEPB, Inhale 1 puff into the lungs 2 (two) times a day., Disp: 6 each, Rfl: 0   amLODipine (NORVASC) 10 MG tablet, Take 10 mg by mouth daily., Disp: , Rfl:    aspirin 81 MG tablet, Take 81 mg by mouth daily., Disp: , Rfl:    atorvastatin (LIPITOR) 20 MG tablet, Take 20 mg by mouth daily., Disp: , Rfl: 2   azaTHIOprine (IMURAN) 50 MG tablet, Take 50 mg by mouth 2 (two) times a day., Disp: , Rfl:    dapsone 100 MG tablet, Take 1 tablet (100 mg total) by mouth daily., Disp: 30 tablet, Rfl: 1   folic acid (FOLVITE) 914 MCG tablet, Take 400 mcg by mouth  daily., Disp: , Rfl:    levothyroxine (SYNTHROID, LEVOTHROID) 25 MCG tablet, Take 25 mcg by mouth daily., Disp: , Rfl:    loratadine (CLARITIN) 10 MG tablet, Take 1 tablet (10 mg total) by mouth daily., Disp: 30 tablet, Rfl: 0   metFORMIN (GLUCOPHAGE-XR) 500 MG 24 hr tablet, Take 1 tablet by mouth 2 (two) times daily., Disp: , Rfl:    NOVOLIN N RELION 100 UNIT/ML injection, Inject 20-22 Units into the skin 2 (two) times a day. 20 units in morning,22 units in evening., Disp: , Rfl:    predniSONE (DELTASONE) 1 MG tablet, Take 3 mg by mouth daily with breakfast., Disp: , Rfl:       Objective:   Vitals:   06/29/19 0924  BP: (!) 146/68  Pulse: 94  Temp: 98.7 F (37.1 C)  SpO2: 91%  Weight: 181 lb 12.8 oz (82.5 kg)  Height: _0  (1.676 m)    Estimated body mass index is 29.34 kg/m as calculated from the following:   Height as of this encounter: _1  (1.676 m).   Weight as of this encounter: 181 lb 12.8 oz (82.5 kg).  _2 @  [employer]   06/29/19 0924  Weight: 181 lb 12.8 oz (82.5 kg)     Physical Exam  General Appearance:    Alert, cooperative, no distress, appears stated age - yes , Deconditioned looking - no , OBESE  - no, Sitting on Wheelchair -  no  Head:    Normocephalic, without obvious abnormality, atraumatic  Eyes:    PERRL, conjunctiva/corneas clear,  Ears:    Normal TM's and external ear canals, both ears  Nose:   Nares normal, septum midline, mucosa normal, no drainage    or sinus tenderness. OXYGEN ON  - yes . Patient is @ 5L    Throat:   Lips, mucosa, and tongue normal; teeth and gums normal. Cyanosis on lips - no  Neck:   Supple, symmetrical, trachea midline, no adenopathy;    thyroid:  no enlargement/tenderness/nodules; no carotid   bruit or JVD  Back:     Symmetric, no curvature, ROM normal, no CVA tenderness  Lungs:     Distress - no , Wheeze no, Barrell Chest - no, Purse lip breathing - no, Crackles - yes   Chest Wall:    No tenderness or  deformity.    Heart:    Regular rate and rhythm, S1 and S2 normal, no rub   or gallop, Murmur - no  Breast Exam:    NOT DONE  Abdomen:     Soft, non-tender, bowel sounds active all four quadrants,    no masses, no organomegaly. Visceral obesity -  yes  Genitalia:   NOT DONE  Rectal:   NOT DONE  Extremities:   Extremities - normal, Has Cane - no, Clubbing - maybe, Edema - no  Pulses:   2+ and symmetric all extremities  Skin:   Stigmata of Connective Tissue Disease - yes RA  Lymph nodes:   Cervical, supraclavicular, and axillary nodes normal  Psychiatric:  Neurologic:   Pleasant - yes, Anxious - no, Flat affect - no  CAm-ICU - neg, Alert and Oriented x 3 - yes, Moves all 4s - yes, Speech - normal, Cognition - intact           Assessment:       ICD-10-CM   1. 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 Immunosuppressed status ([place])  D89.9   3. Coronary artery calcification seen on CAT scan  I25.10    He has worsening desaturation.  Typically this would be from progressive pulmonary fibrosis but ironically his CT scan of the chest is stable between 2018 and 2020.  We will have to get a pulmonary function test to look at changes in pulmonary function testing.  Otherwise his hypoxemia could be from development of cor pulmonale.  He also has coronary artery calcifications on the CT scan therefore it is best to get a cardiology evaluation.  He could potentially have opportunistic infections given his extensive immunosuppression from medications.  However CT scan of the chest does not report any groundglass opacities.  At this point in time would like to proceed with a cardiology evaluation and getting a pulmonary function test.  Based on the results of these we could offer a bronchoscopy with lavage to rule out any opportunistic infections.  Assuming all of this is negative then he could be a candidate for adding nintedanib for progression of pulmonary fibrosis in autoimmune  setting    Plan:     Patient Instructions     ICD-10-CM   1. 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 Immunosuppressed status ([place])  D89.9   3. Coronary artery calcification seen on CAT scan  I25.10    Your shortness of breath has worsened because of the fact you are requiring more oxygen This is a little bit surprising because the CT scan of the chest is stable between 2018 and 2020 July    -Need to identify the reason for worsening which could be heart related or progressive scar tissue in the lung and also rare infections in the lung although this is unlikely but possible given you immunosuppressed status   PLan -refer cardiology for echo (worsening shortness of breatth) and coronary artrery calcification -Spirometry and DLCO  Followup - 30 min ILD visit 1-2 months - but after completing above; based on above tests we might have to consider bronchoscopy or add ofev for fibrosis or consider cardiac referral   > 50% of this > 25 min visit spent in face to face counseling or coordination of care - by this undersigned MD - Dr Brand Males. This includes one or more of the following documented above: discussion of test results, diagnostic or treatment recommendations, prognosis, risks and benefits of management options, instructions, education, compliance or risk-factor reduction   SIGNATURE    Dr. Brand Males, [name], [name],  Pulmonary and Critical Care Medicine Staff Physician, [place] Director - Interstitial Lung Disease  Program  Pulmonary [place] at  [place], [place], [address]  Pager: [phone], If no answer or between  15:00h - 7:00h: call [phone] Telephone: [phone]  10:07 AM 06/29/2019

## 2019-07-04 DIAGNOSIS — I10 Essential (primary) hypertension: Secondary | ICD-10-CM | POA: Diagnosis not present

## 2019-07-04 DIAGNOSIS — J129 Viral pneumonia, unspecified: Secondary | ICD-10-CM | POA: Diagnosis not present

## 2019-07-04 DIAGNOSIS — E782 Mixed hyperlipidemia: Secondary | ICD-10-CM | POA: Diagnosis not present

## 2019-07-04 DIAGNOSIS — R0902 Hypoxemia: Secondary | ICD-10-CM | POA: Diagnosis not present

## 2019-07-04 DIAGNOSIS — E1065 Type 1 diabetes mellitus with hyperglycemia: Secondary | ICD-10-CM | POA: Diagnosis not present

## 2019-07-11 ENCOUNTER — Telehealth: Payer: Self-pay | Admitting: Internal Medicine

## 2019-07-11 DIAGNOSIS — J8410 Pulmonary fibrosis, unspecified: Secondary | ICD-10-CM | POA: Diagnosis not present

## 2019-07-11 DIAGNOSIS — Z23 Encounter for immunization: Secondary | ICD-10-CM | POA: Diagnosis not present

## 2019-07-11 DIAGNOSIS — E1065 Type 1 diabetes mellitus with hyperglycemia: Secondary | ICD-10-CM | POA: Diagnosis not present

## 2019-07-11 DIAGNOSIS — Z7189 Other specified counseling: Secondary | ICD-10-CM | POA: Diagnosis not present

## 2019-07-11 DIAGNOSIS — I10 Essential (primary) hypertension: Secondary | ICD-10-CM | POA: Diagnosis not present

## 2019-07-11 DIAGNOSIS — E782 Mixed hyperlipidemia: Secondary | ICD-10-CM | POA: Diagnosis not present

## 2019-07-11 NOTE — Telephone Encounter (Signed)
Called and spoke with pt. Pt states his breathing and oxygen have been better since his last visit with MR and is inquiring if he still needs the referral to cardiology. I reinforced MR's last office instructions:   Plan -refer cardiology for echocardiogram (worsening shortness of breath) and coronary artery calcification.  Pt verbalized understanding with no additional questions or concerns. Pt has an appt with cardiology scheduled for 08/02/2019. Nothing further needed at this time.

## 2019-07-11 NOTE — Telephone Encounter (Signed)
Pt returned call

## 2019-07-11 NOTE — Telephone Encounter (Signed)
ATC pt, line went to voicemail. LMTCB x1. 

## 2019-07-17 ENCOUNTER — Ambulatory Visit: Payer: Self-pay | Admitting: Pharmacist

## 2019-07-17 ENCOUNTER — Telehealth: Payer: Self-pay | Admitting: Pulmonary Disease

## 2019-07-17 ENCOUNTER — Other Ambulatory Visit: Payer: Self-pay | Admitting: Pharmacist

## 2019-07-17 DIAGNOSIS — Z79899 Other long term (current) drug therapy: Secondary | ICD-10-CM | POA: Diagnosis not present

## 2019-07-17 DIAGNOSIS — M0589 Other rheumatoid arthritis with rheumatoid factor of multiple sites: Secondary | ICD-10-CM | POA: Diagnosis not present

## 2019-07-17 DIAGNOSIS — M150 Primary generalized (osteo)arthritis: Secondary | ICD-10-CM | POA: Diagnosis not present

## 2019-07-17 DIAGNOSIS — Z13820 Encounter for screening for osteoporosis: Secondary | ICD-10-CM | POA: Diagnosis not present

## 2019-07-17 DIAGNOSIS — J849 Interstitial pulmonary disease, unspecified: Secondary | ICD-10-CM | POA: Diagnosis not present

## 2019-07-17 NOTE — Patient Outreach (Signed)
[facility]) Care Management  [place] 07/17/2019  [name] 1946/07/13 [id number]  Reason for call: f/u on need for patient assistance program with inhalers   Successful call today with [name] spouse.  She reports patient is still using [name] samples and does not think that he will need to apply for Spiriva substitution as office is still providing samples to patient.    Message sent to NP Wyn Quaker to confirm if [name] samples still available.  Office has 3 samples left, then cannot confirm any further procurement.   Incoming call from [name].  Patient reports he has 11 boxes left of [name] at home which will last him 5.5 months.  He would like to wait to apply for Spiriva until after he is finished with these samples.  We reviewed correct dosing (1 puff BID). Patient reports adherence.    Plan: Will assist patient with Spiriva PAP application for [id number] when he is finished with [name] samples.   Ralene Bathe, PharmD, [place] [phone]

## 2019-07-17 NOTE — Telephone Encounter (Signed)
-----  Message from Rudean Haskell, [facility] sent at 07/17/2019 10:16 AM EDT ----- Regarding: [name] samples Hi Aaron Edelman, Do you still have [name] samples on hand to provide to Mr. Benzel?  If so, I will close his [place] case. If not, can help him apply for Spiriva.   Thanks, [employer]

## 2019-07-17 NOTE — Telephone Encounter (Signed)
Colleen from [facility] sent a message back saying the patient has 5 boxes of [name] samples at this time.  This should last him towards the end of the year.  She will plan on following up with him at that time in order to apply for patient assistance for calendar year 2021 for Spiriva Respimat 2.5.  Nothing further needed at this time.   Wyn Quaker FNP

## 2019-07-17 NOTE — Telephone Encounter (Signed)
07/17/2019 1103  Triage,  Can you please contact the patient and see how he is doing on his [place].  Does he need any more samples?  If he does we can provide him with some [name] samples to help him.  I have also notified Ralene Bathe [facility] with [facility] that patient will likely need to be transition to Spiriva Respimat 2.5 and apply for patient assistance as this will be the last of her [name] samples in our office.  Wyn Quaker, FNP

## 2019-07-26 DIAGNOSIS — M0589 Other rheumatoid arthritis with rheumatoid factor of multiple sites: Secondary | ICD-10-CM | POA: Diagnosis not present

## 2019-08-01 DIAGNOSIS — I700 Atherosclerosis of aorta: Secondary | ICD-10-CM | POA: Insufficient documentation

## 2019-08-01 DIAGNOSIS — R0602 Shortness of breath: Secondary | ICD-10-CM | POA: Insufficient documentation

## 2019-08-01 NOTE — Progress Notes (Signed)
Cardiology Office Note   Date:  08/02/2019   ID:  [name], DOB 1946/01/14, MRN [id number]  PCP:  Merrilee Seashore, MD  Cardiologist:   No primary care provider on file. Referring:  Dr. Chase Caller  Chief Complaint  Patient presents with  . Coronary Calcium      History of Present Illness: [name] is a 73 y.o. male who is referred by Dr. Chase Caller for evaluation of increasing DOE and coronary artery calcification (left).   The patient has no past cardiac history.  He did have an echo in 2013 that had a normal EF.   I reviewed these records for this appt. he has been followed for pulmonary fibrosis and managed for this.  He says he was on oxygen throughout the day and the night but more recently has been on oxygen only at night.  He says his breathing got worse for a while but he says that more recently it actually improved.  [name] less short of breath than he was.  He denies any chest pressure, neck or arm discomfort.  He has not had any palpitations, presyncope or syncope.  He has had no weight gain or edema.  He says that he can do some mild yard work.  He does this without limitations.  He rides mower.  Past Medical History:  Diagnosis Date  . Arthritis   . Diabetes mellitus   . Hypertension   . Pneumonia   . Thyroid disease     Past Surgical History:  Procedure Laterality Date  . KNEE ARTHROSCOPY    . NO PAST SURGERIES       Current Outpatient Medications  Medication Sig Dispense Refill  . Aclidinium Bromide ([name] PRESSAIR) 400 MCG/ACT AEPB Inhale 1 puff into the lungs 2 (two) times a day. 6 each 0  . amLODipine (NORVASC) 10 MG tablet Take 10 mg by mouth daily.    [name] Kitchen aspirin 81 MG tablet Take 81 mg by mouth daily.    [name] Kitchen atorvastatin (LIPITOR) 20 MG tablet Take 20 mg by mouth daily.  2  . azaTHIOprine (IMURAN) 50 MG tablet 50 mg. Take two tablets by mouth in the morning and one in the evening    . folic acid (FOLVITE) 889 MCG tablet Take 400 mcg by mouth  daily.    [name] Kitchen levothyroxine (SYNTHROID, LEVOTHROID) 25 MCG tablet Take 25 mcg by mouth daily.    . metFORMIN (GLUCOPHAGE-XR) 500 MG 24 hr tablet Take 1 tablet by mouth 2 (two) times daily.    [name] Kitchen NOVOLIN N RELION 100 UNIT/ML injection Inject 20-22 Units into the skin 2 (two) times a day. 20 units in morning,22 units in evening.    . predniSONE (DELTASONE) 1 MG tablet Take 3 mg by mouth daily with breakfast.     No current facility-administered medications for this visit.     Allergies:   Bactrim [sulfamethoxazole-trimethoprim]    Social History:  The patient  reports that he quit smoking about 34 years ago. His smoking use included cigarettes. He has a 5.00 pack-year smoking history. He has never used smokeless tobacco. He reports that he does not drink alcohol or use drugs.   Family History:  The patient's family history includes Diabetes in his mother; Stroke in his mother.    ROS:  Please see the history of present illness.   Otherwise, review of systems are positive for none.   All other systems are reviewed and negative.    PHYSICAL EXAM: VS:  BP (!) 142/68   Pulse (!) 104   Ht _0  (1.676 m)   Wt 185 lb (83.9 kg)   BMI 29.86 kg/m  , BMI Body mass index is 29.86 kg/m. GENERAL:  Well appearing HEENT:  Pupils equal round and reactive, fundi not visualized, oral mucosa unremarkable NECK:  No jugular venous distention, waveform within normal limits, carotid upstroke brisk and symmetric, no bruits, no thyromegaly LYMPHATICS:  No cervical, inguinal adenopathy LUNGS:  Diffuse fine crackles BACK:  No CVA tenderness CHEST:  Unremarkable HEART:  PMI not displaced or sustained,S1 and S2 within normal limits, no S3, no S4, no clicks, no rubs, no murmurs ABD:  Flat, positive bowel sounds normal in frequency in pitch, no bruits, no rebound, no guarding, no midline pulsatile mass, no hepatomegaly, no splenomegaly EXT:  2 plus pulses throughout, no edema, no cyanosis no clubbing SKIN:  No  rashes no nodules NEURO:  Cranial nerves II through XII grossly intact, motor grossly intact throughout PSYCH:  Cognitively intact, oriented to person place and time    EKG:  EKG is ordered today. The ekg ordered today demonstrates sinus rhythm, rate 98, axis rightward, intervals within normal limits, no acute changes.   Recent Labs: 05/26/2019: B Natriuretic Peptide 43.6 05/30/2019: BUN 13; Creatinine, Ser 0.91; Potassium 3.6; Sodium 139 06/27/2019: ALT 14; Hemoglobin 8.7 Repeated and verified X2.; Platelets 303.0    Lipid Panel No results found for: CHOL, TRIG, HDL, CHOLHDL, VLDL, LDLCALC, LDLDIRECT    Wt Readings from Last 3 Encounters:  08/02/19 185 lb (83.9 kg)  06/29/19 181 lb 12.8 oz (82.5 kg)  05/30/19 186 lb (84.4 kg)      Other studies Reviewed: Additional studies/ records that were reviewed today include: CT of the chest. Review of the above records demonstrates:  Please see elsewhere in the note.     ASSESSMENT AND PLAN:  CAD:  This was noted on CT.   However, patient has no symptoms related to this.  At this point I do not think that further testing is indicated.  Rather we can continue with risk reduction.  AORTIC ATHEROSCLEROSIS:  Noted on CT.   He has an excellent lipid profile.  His blood pressure is usually well controlled though very slightly elevated.  His diabetes is managed well.  [name] not smoking cigarettes.  I will be further work-up is changed.  He can continue with primary risk reduction.  INTERSTITIAL LUNG DISEASE/UIP.:  [name] managed by Dr. Jamey Reas.  Of note I would not think that his breathing difficulties are an anginal equivalent unless he has any worsening symptoms.  At that point have a low threshold to do a perfusion study.  For now he says his breathing is improved at all defer his management to Dr. Chase Caller Current medicines are reviewed at length with the patient today.  The patient does not have concerns regarding medicines.  The  following changes have been made:  no change  Labs/ tests ordered today include:  No orders of the defined types were placed in this encounter.    Disposition:   FU with me as needed.      Signed, Minus Breeding, MD  08/02/2019 4:52 PM    [facility]

## 2019-08-02 ENCOUNTER — Ambulatory Visit (INDEPENDENT_AMBULATORY_CARE_PROVIDER_SITE_OTHER): Payer: Medicare Other | Admitting: Cardiology

## 2019-08-02 ENCOUNTER — Other Ambulatory Visit: Payer: Self-pay

## 2019-08-02 ENCOUNTER — Encounter: Payer: Self-pay | Admitting: Cardiology

## 2019-08-02 VITALS — BP 142/68 | HR 104 | Ht 66.0 in | Wt 185.0 lb

## 2019-08-02 DIAGNOSIS — R0602 Shortness of breath: Secondary | ICD-10-CM | POA: Diagnosis not present

## 2019-08-02 DIAGNOSIS — I700 Atherosclerosis of aorta: Secondary | ICD-10-CM | POA: Diagnosis not present

## 2019-08-02 DIAGNOSIS — R931 Abnormal findings on diagnostic imaging of heart and coronary circulation: Secondary | ICD-10-CM

## 2019-08-02 NOTE — Patient Instructions (Signed)
Medication Instructions:  The current medical regimen is effective;  continue present plan and medications.  If you need a refill on your cardiac medications before your next appointment, please call your pharmacy.   Follow-Up: Follow up as needed with Dr Percival Spanish.  Thank you for choosing [facility]!!

## 2019-08-03 DIAGNOSIS — J129 Viral pneumonia, unspecified: Secondary | ICD-10-CM | POA: Diagnosis not present

## 2019-08-03 DIAGNOSIS — R0902 Hypoxemia: Secondary | ICD-10-CM | POA: Diagnosis not present

## 2019-08-07 DIAGNOSIS — E119 Type 2 diabetes mellitus without complications: Secondary | ICD-10-CM | POA: Diagnosis not present

## 2019-08-07 DIAGNOSIS — H35371 Puckering of macula, right eye: Secondary | ICD-10-CM | POA: Diagnosis not present

## 2019-08-07 DIAGNOSIS — H5213 Myopia, bilateral: Secondary | ICD-10-CM | POA: Diagnosis not present

## 2019-08-09 NOTE — Addendum Note (Signed)
Addended by: Shellia Cleverly on: 08/09/2019 08:48 AM   Modules accepted: Orders

## 2019-09-03 DIAGNOSIS — R0902 Hypoxemia: Secondary | ICD-10-CM | POA: Diagnosis not present

## 2019-09-03 DIAGNOSIS — J129 Viral pneumonia, unspecified: Secondary | ICD-10-CM | POA: Diagnosis not present

## 2019-10-03 DIAGNOSIS — R0902 Hypoxemia: Secondary | ICD-10-CM | POA: Diagnosis not present

## 2019-10-03 DIAGNOSIS — J129 Viral pneumonia, unspecified: Secondary | ICD-10-CM | POA: Diagnosis not present

## 2019-10-09 ENCOUNTER — Ambulatory Visit: Payer: Self-pay | Admitting: Pharmacist

## 2019-10-09 ENCOUNTER — Other Ambulatory Visit: Payer: Self-pay | Admitting: Pharmacist

## 2019-10-09 NOTE — Patient Outreach (Signed)
[facility]) Care Management  [place] 10/09/2019  Prynce Jacober 07-28-1946 [id number]  Reason for call: f/u on patient assistance program for Spiriva  Successful call with Mr. Police.  He reports he still has a few Turdoza samples left which seem to be working well for him.  [name] agreeable to start Spiriva patient assistance program application for 8003.  Reviewed necessary documents to include with application. He denies needing assistance for other medications and states he no longer takes insulin.    Plan: I will route patient assistance letter to [facility] pharmacy technician who will coordinate patient assistance program application process for Spiriva.  [facility] pharmacy technician will assist with obtaining all required documents from both patient and provider(s) and submit application(s) once completed.    Ralene Bathe, PharmD, [place] [phone]

## 2019-10-12 ENCOUNTER — Other Ambulatory Visit: Payer: Self-pay | Admitting: Pharmacy Technician

## 2019-10-12 NOTE — Patient Outreach (Signed)
[facility]) Care Management  10/12/2019  Daeshon Grammatico 22-Mar-1946 [id number]                                       Medication Assistance Referral  Referral From: [facility] RPh Waynard Reeds  Medication/Company: [employer] / [employer] Patient application portion:  Mailed Provider application portion: Faxed  to B. Warner Mccreedy, [place] Provider address/fax verified via: Office website   Follow up:  Will follow up with patient in 10-14 business days to confirm application(s) have been received.  Maud Deed Chana Bode [place] Certified Pharmacy Technician [place] Management Direct Dial:[phone]

## 2019-10-16 ENCOUNTER — Other Ambulatory Visit: Payer: Self-pay

## 2019-10-16 DIAGNOSIS — M150 Primary generalized (osteo)arthritis: Secondary | ICD-10-CM | POA: Diagnosis not present

## 2019-10-16 DIAGNOSIS — J849 Interstitial pulmonary disease, unspecified: Secondary | ICD-10-CM | POA: Diagnosis not present

## 2019-10-16 DIAGNOSIS — Z79899 Other long term (current) drug therapy: Secondary | ICD-10-CM | POA: Diagnosis not present

## 2019-10-16 DIAGNOSIS — M0589 Other rheumatoid arthritis with rheumatoid factor of multiple sites: Secondary | ICD-10-CM | POA: Diagnosis not present

## 2019-10-16 DIAGNOSIS — Z13820 Encounter for screening for osteoporosis: Secondary | ICD-10-CM | POA: Diagnosis not present

## 2019-10-16 MED ORDER — SPIRIVA RESPIMAT 2.5 MCG/ACT IN AERS: 2.0000 | INHALATION_SPRAY | Freq: Every day | RESPIRATORY_TRACT | 0 refills | Status: DC

## 2019-11-01 ENCOUNTER — Other Ambulatory Visit: Payer: Self-pay | Admitting: Pharmacy Technician

## 2019-11-01 NOTE — Patient Outreach (Signed)
[facility]) Care Management  11/01/2019  Duward Allbritton Mar 02, 1946 [id number]   Received patient portion(s) of patient assistance application(s) for Spiriva Respimat. Faxed completed application and required documents into [employer].  Will follow up with company(ies) in 10-14 business days to check status of application(s).  Maud Deed Chana Bode [place] Certified Pharmacy Technician [place] Management Direct [phone]

## 2019-11-03 DIAGNOSIS — J129 Viral pneumonia, unspecified: Secondary | ICD-10-CM | POA: Diagnosis not present

## 2019-11-03 DIAGNOSIS — R0902 Hypoxemia: Secondary | ICD-10-CM | POA: Diagnosis not present

## 2019-11-08 ENCOUNTER — Other Ambulatory Visit: Payer: Self-pay | Admitting: Pharmacy Technician

## 2019-11-08 NOTE — Patient Outreach (Signed)
[facility]) Care Management  11/08/2019  [name] June 01, 1946 [id number]    Follow up call placed to [employer] regarding patient assistance application(s) for [place] states patient has been temporarily approved, program requesting proof of LIS denial. 90 day supply of medication to be delivered to patients home on 1/8.  Follow up:  Will route note to [place] to inform  [employer]. Chana Bode [place] Certified Pharmacy Technician [place] Management Direct [phone]

## 2019-11-13 ENCOUNTER — Other Ambulatory Visit: Payer: Self-pay | Admitting: Pharmacist

## 2019-11-13 NOTE — Patient Outreach (Signed)
[name]) Care Management  [name] 11/13/2019  [name] 09-24-1946 [id number]  Communication received from [place] that patient temporarily approved for Spiriva PAP, will need to send in denial letter for LIS to be approved through the rest of 2021.  Note that patient already applied and was denied for Extra Help in July 2020.   Successful call to [name].  He reports he received shipment of Spiriva on Friday and has switched over to using Spiriva from [place].  He unfortunately does not know where his Extra Help denial letter is therefore will have to re-apply and wait for another denial letter to arrive.  He can then mail this letter to [facility] and we will submit to Spiriva PAP.    Assisted patient with [id number] Extra Help application.  Plan: Will f/u with patient in 2 months re: denial letter  Ralene Bathe, PharmD, [place] [phone]

## 2019-12-04 DIAGNOSIS — R0902 Hypoxemia: Secondary | ICD-10-CM | POA: Diagnosis not present

## 2019-12-04 DIAGNOSIS — J129 Viral pneumonia, unspecified: Secondary | ICD-10-CM | POA: Diagnosis not present

## 2019-12-11 DIAGNOSIS — M0589 Other rheumatoid arthritis with rheumatoid factor of multiple sites: Secondary | ICD-10-CM | POA: Diagnosis not present

## 2019-12-22 ENCOUNTER — Other Ambulatory Visit (HOSPITAL_COMMUNITY)
Admission: RE | Admit: 2019-12-22 | Discharge: 2019-12-22 | Disposition: A | Discharge status: Home / Self Care | Payer: Medicare Other | Source: Ambulatory Visit | Attending: Internal Medicine | Admitting: Internal Medicine

## 2019-12-22 ENCOUNTER — Other Ambulatory Visit: Payer: Self-pay

## 2019-12-22 DIAGNOSIS — Z20822 Contact with and (suspected) exposure to covid-19: Secondary | ICD-10-CM | POA: Insufficient documentation

## 2019-12-22 DIAGNOSIS — Z01812 Encounter for preprocedural laboratory examination: Secondary | ICD-10-CM | POA: Insufficient documentation

## 2019-12-22 LAB — SARS CORONAVIRUS 2 (TAT 6-24 HRS): SARS Coronavirus 2: NEGATIVE

## 2019-12-25 ENCOUNTER — Other Ambulatory Visit: Payer: Self-pay | Admitting: Pharmacist

## 2019-12-25 ENCOUNTER — Ambulatory Visit: Payer: Self-pay | Admitting: Pharmacist

## 2019-12-25 NOTE — Patient Outreach (Signed)
[facility]) Care Management  [place] 12/25/2019  Press Casale 18-Apr-1946 [id number]  Reason for call: f/u on Extra Help denial letter  Successful call with patient.  He reports he does have denial letter but would like [facility] to send him a return envelope to mail back.    Plan: Will request [facility] pharmacy technician send patient return envelope   Ralene Bathe, PharmD, [place] [phone]

## 2019-12-26 ENCOUNTER — Ambulatory Visit: Payer: Medicare Other | Admitting: Internal Medicine

## 2019-12-26 ENCOUNTER — Other Ambulatory Visit: Payer: Self-pay

## 2019-12-26 ENCOUNTER — Ambulatory Visit: Payer: Medicare Other | Admitting: Pulmonary Disease

## 2019-12-26 DIAGNOSIS — J8410 Pulmonary fibrosis, unspecified: Secondary | ICD-10-CM

## 2019-12-26 LAB — PULMONARY FUNCTION TEST
DL/VA % pred: 88 %
DL/VA: 3.58 ml/min/mmHg/L
DLCO unc % pred: 58 %
DLCO unc: 13.14 ml/min/mmHg
FEF 25-75 Pre: 2.09 L/sec
FEF2575-%Pred-Pre: 106 %
FEV1-%Pred-Pre: 72 %
FEV1-Pre: 1.69 L
FEV1FVC-%Pred-Pre: 113 %
FEV6-%Pred-Pre: 65 %
FEV6-Pre: 1.96 L
FEV6FVC-%Pred-Pre: 106 %
FVC-%Pred-Pre: 61 %
FVC-Pre: 1.96 L
Pre FEV1/FVC ratio: 86 %
Pre FEV6/FVC Ratio: 100 %

## 2019-12-31 ENCOUNTER — Ambulatory Visit: Payer: Medicare Other

## 2020-01-01 DIAGNOSIS — R0902 Hypoxemia: Secondary | ICD-10-CM | POA: Diagnosis not present

## 2020-01-01 DIAGNOSIS — J129 Viral pneumonia, unspecified: Secondary | ICD-10-CM | POA: Diagnosis not present

## 2020-01-01 DIAGNOSIS — M0589 Other rheumatoid arthritis with rheumatoid factor of multiple sites: Secondary | ICD-10-CM | POA: Diagnosis not present

## 2020-01-02 ENCOUNTER — Other Ambulatory Visit: Payer: Self-pay | Admitting: Pharmacy Technician

## 2020-01-02 NOTE — Patient Outreach (Signed)
[facility]) Care Management  01/02/2020  Horald Birky [birth year] [id number]   Received copy of patient's low income subsidy denial letter. Faxed document into [employer] to be processed for approval continuation for Spiriva Respimat.  Will follow up with company in 7-10 business days to check status.  Maud Deed Chana Bode [place] Certified Pharmacy Technician [place] Management Direct Dial:[phone]

## 2020-01-04 ENCOUNTER — Other Ambulatory Visit: Payer: Self-pay

## 2020-01-04 ENCOUNTER — Ambulatory Visit: Payer: Medicare Other | Admitting: Internal Medicine

## 2020-01-04 ENCOUNTER — Telehealth: Payer: Self-pay | Admitting: Pharmacy Technician

## 2020-01-04 ENCOUNTER — Encounter: Payer: Self-pay | Admitting: Internal Medicine

## 2020-01-04 ENCOUNTER — Other Ambulatory Visit (INDEPENDENT_AMBULATORY_CARE_PROVIDER_SITE_OTHER): Payer: Medicare Other

## 2020-01-04 VITALS — BP 144/78 | HR 101 | Ht 66.0 in | Wt 185.4 lb

## 2020-01-04 DIAGNOSIS — J8410 Pulmonary fibrosis, unspecified: Secondary | ICD-10-CM | POA: Diagnosis not present

## 2020-01-04 DIAGNOSIS — J849 Interstitial pulmonary disease, unspecified: Secondary | ICD-10-CM | POA: Diagnosis not present

## 2020-01-04 DIAGNOSIS — D849 Immunodeficiency, unspecified: Secondary | ICD-10-CM

## 2020-01-04 DIAGNOSIS — J8489 Other specified interstitial pulmonary diseases: Secondary | ICD-10-CM

## 2020-01-04 DIAGNOSIS — Z5181 Encounter for therapeutic drug level monitoring: Secondary | ICD-10-CM | POA: Diagnosis not present

## 2020-01-04 DIAGNOSIS — J9611 Chronic respiratory failure with hypoxia: Secondary | ICD-10-CM | POA: Diagnosis not present

## 2020-01-04 DIAGNOSIS — M359 Systemic involvement of connective tissue, unspecified: Secondary | ICD-10-CM

## 2020-01-04 DIAGNOSIS — R053 Chronic cough: Secondary | ICD-10-CM

## 2020-01-04 DIAGNOSIS — R05 Cough: Secondary | ICD-10-CM

## 2020-01-04 LAB — CBC WITH DIFFERENTIAL/PLATELET
Basophils Absolute: 0 10*3/uL (ref 0.0–0.1)
Basophils Relative: 1.1 % (ref 0.0–3.0)
Eosinophils Absolute: 0.1 10*3/uL (ref 0.0–0.7)
Eosinophils Relative: 4.8 % (ref 0.0–5.0)
HCT: 37.4 % — ABNORMAL LOW (ref 39.0–52.0)
Hemoglobin: 12.5 g/dL — ABNORMAL LOW (ref 13.0–17.0)
Lymphocytes Relative: 15.9 % (ref 12.0–46.0)
Lymphs Abs: 0.5 10*3/uL — ABNORMAL LOW (ref 0.7–4.0)
MCHC: 33.3 g/dL (ref 30.0–36.0)
MCV: 94.8 fl (ref 78.0–100.0)
Monocytes Absolute: 0.6 10*3/uL (ref 0.1–1.0)
Monocytes Relative: 18.4 % — ABNORMAL HIGH (ref 3.0–12.0)
Neutro Abs: 1.9 10*3/uL (ref 1.4–7.7)
Neutrophils Relative %: 59.8 % (ref 43.0–77.0)
Platelets: 254 10*3/uL (ref 150.0–400.0)
RBC: 3.95 Mil/uL — ABNORMAL LOW (ref 4.22–5.81)
RDW: 16.9 % — ABNORMAL HIGH (ref 11.5–15.5)
WBC: 3.1 10*3/uL — ABNORMAL LOW (ref 4.0–10.5)

## 2020-01-04 LAB — HEPATIC FUNCTION PANEL
ALT: 15 U/L (ref 0–53)
AST: 19 U/L (ref 0–37)
Albumin: 3.8 g/dL (ref 3.5–5.2)
Alkaline Phosphatase: 42 U/L (ref 39–117)
Bilirubin, Direct: 0.1 mg/dL (ref 0.0–0.3)
Total Bilirubin: 0.5 mg/dL (ref 0.2–1.2)
Total Protein: 7.5 g/dL (ref 6.0–8.3)

## 2020-01-04 MED ORDER — HYDROCODONE-HOMATROPINE 5-1.5 MG/5ML PO SYRP: 5.0000 mL | ORAL_SOLUTION | Freq: Every day | ORAL | 0 refills | Status: DC | PRN

## 2020-01-04 NOTE — Patient Instructions (Addendum)
ICD-10-CM   1. 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 Interstitial lung disease due to connective tissue disease ([place])  J84.89    M35.9   3. Therapeutic drug monitoring  Z51.81   4. Chronic respiratory failure with hypoxia (HCC)  J96.11   5. Immunosuppressed status ([place])  D84.9   6. Chronic cough  R05     As you know you have pulmonary fibrosis due to your rheumatoid arthritis Despite strong medications for your rheumatoid your lungs continue to weaken Your cough iis directly related to your pulmonary fibrosis  Plan - continue your RA medications from Dr Kathlene November  - continue o2 with exertion and night - check LFT 01/04/2020 - start ofev 127m twice daily   - this drug is to prevent lung from getting weaker but will not help cough or shortness of breath  - it can cause diarrhea, nausea, weight loss,   -  you need to monitor liver tests  - there are other rarer side efects from bleeding to heart attack risk   - at this point benefit outweighs risk  - suggest you consider joining the ILD-PRO registry study if you qualify   - you toook copy of consent from [facility] staff   - for cough - start hycodan 547monce daily as needed  - noted: inhaler therapy - probably not needed due to lack of emphysema on CT  Followup - research will call you for research appointment  - 5-6 weeks witth Dr RaChase Caller  - 15 min slot with NP or Dr RaChase Callero review ofev tolerance   - work on [employer]

## 2020-01-04 NOTE — Telephone Encounter (Signed)
Called patient to obtain household size and income information to look into grant foundations. Patient states he will need to call back to provide.

## 2020-01-04 NOTE — Progress Notes (Signed)
OV 01/01/2017  Chief Complaint  Patient presents with  . Follow-up    Pt here after PFT. Pt states overall his breathing is unchanged since last OV. Pt c/o occ dry cough. Pt deneis CP/tightness and f/c/s.    Follow-up interstitial lung disease UIP pattern associated with rheumatoid arthritis and immune suppression. Last visit in March 2017. He doesn't that overall he has been stable. He has a new rheumatologist but still at [name]. His wife also says he is stable. However as he started talking to them his wife slowly started telling me that for the last few to several months he is wheezing more than usual and is a little more short of breath than usual. However he denied it. This is compliant with his medications. [name] not on any inhaler therapy. He uses oxygen only at night. Previous CT scans of the chest in September 2016 in March 2017 did not report any associated emphysema. His most recent pulmonary function test is shown that on Combivent. I personally visualized the graph: [name] Kitchen Forced vital capacity is stable with a diffusion capacity seems to decline over time. Walking desaturation test 185 feet 3 laps on room air: He desaturated to 84%. And then we walked him on 2 L oxygen he did not desaturate.   OV 06/23/2017  Chief Complaint  Patient presents with  . Follow-up    pt here after CT in 04/2017. Pt states his breathing is unchanged since last OV. Pt c/o interemittent dry cough. Pt denies CP/tightness and f/c/s.      Follow-up interstitial lung disease UIP pattern associated with rheumatoid arthritis and on Imuran. Last visit in March 2018 I was concerned about progressive interstitial lung disease because he started desaturating on room air and his FVC show decline. Currently rated a high-resolution CT chest that confirmed progressive lung disease. We walked him 185 feet 3 laps on room air and he desaturated at third lap to 89%. This is very  similar to previous visit suggesting that  that his decline has been more gradual over the last 1 year. Compared to last visit he feels stable. His wife feels the same. [name] aware of his worsening fibrosis and the potential need to change anti fibrotics  OV 09/27/2017  Chief Complaint  Patient presents with  . Follow-up    PFT done today. Pt states that he thinks his breathing has become worse. Has complaints of SOB with exertion, occ. dry cough. Denies any CP. States that he does wear O2 at night, DME: [facility].     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  Patient is on Imuran.  [name] having progressive disease this year 2018.  He told my certified medical assistant that he is having progressive shortness of breath but to me he tells me that he is stable.  As always he is a very poor historian.  Wife gives a better history.  He has oxygen at home for the night which she uses but does not use his exertional small portable system even though this helps him.  They do admit that dyspnea is progressive.  In fact pulmonary function test today shows progressive disease.  He also seem to desaturate a little bit more easily.  He continues to be on Imuran.  In talking to him it appears his rheumatologist has now started him on Rituxan.  He has had 2 doses 2 weeks apart with  the most recent one being mid November 2018.  The next dose is at mid May 2019 which is 50-monthinterval.  He says he is up-to-date with his flu shot but is not had a conversation with his primary care physician about the new shingles vaccine that is inactivated vaccine.  [name] not having much of joint issues.  [name] aware that the Rituxan is specifically designed to slow down the progression of pulmonary fibrosis.  Walking desaturation test 185 feet x3 laps on room air with a forehead probe.  Resting heart rate was 95/min.  Final heart rate 108/min.  Resting pulse ox was 95%.  By the time he  finished his second lap he desaturated to 84%.  He was then placed on 2 L nasal cannula and he was able to maintain his saturation to 95%.   OV 12/20/2017  Chief Complaint  Patient presents with  . Follow-up    follow up PFT,SOB with activity/exertion   [name] is here for follow-up because of his progressive UIP pattern pulmonary fibrosis related to rheumatoid arthritis.  At his last visit he was started on Rituxan by rheumatology.  Since then we started him on portable oxygen but he says he is not using it because he does not like to use it.  He does use oxygen at night which is helping.  His next dose of Rituxan is in May 2019.  Both he and his wife attest that since the start of Rituxan his symptoms are stable particularly shortness of breath is not any worse.  In fact pulmonary function test today shows continued ongoing stability with FVC and DLCO would suggest that the Rituxan might be helping him.  There are no new issues.  [name] here to have a shingles vaccine.  I told him about this last visit to talk to primary care physician but he does not remember this.   IMPRESSION: June 2018 1. Continued interval progression of fibrotic interstitial lung disease with extensive honeycombing, asymmetrically involving the right lung, with mild basilar predominance, diagnostic of usual interstitial pneumonia (UIP) . 2. Stable mild reactive mediastinal lymphadenopathy. 3. Aortic atherosclerosis.  One vessel coronary atherosclerosis.   Electronically Signed   By: [name]   On: 04/09/2017 10:51   OV 08/01/2018  Subjective:  Patient ID: LOsama Coleson male , DOB: 611-15-1947, age 74y.o. , MRN: [id number], ADDRESS: [address] [place] [id number]  08/01/2018 -   Chief Complaint  Patient presents with  . Follow-up    PFT today, increased coughing (non-productive)    Follow-up interstitial lung disease UIP pattern associated with rheumatoid arthritis. Rx Rituxan since approx  Oct/Nov 2018. Background Immuran  HPI LAngelaScales 74y.o. -presents as usual with his wife.  He tells me that he continues on background Imuran.  [name] also on scheduled Rituxan.  He has an infusion coming up in a month or so.  This is according to his wife.  He continues to be a poor historian.  As best as I can gather from both of them is that his shortness of breath is actually a little bit better ever since starting Rituxan..  In fact his PFTs show continued improvement/stability.  With his FVC and DLCO.  However he tells me that his cough might be a little bit worse.  In talking to him it appears that is only mildly worse.  It happens mostly at night when he lies down.  He  coughs a few times and then it does not bother him.  The cough is dry.  He denies any postnasal drip or acid reflux disease.  He does not have any fever or sputum production.       OV 10/06/2018  Subjective:  Patient ID: Fredrich Cory, male , DOB: Oct 07, 1946 , age 76 y.o. , MRN: [id number] , ADDRESS: [address] [place] [address]   10/06/2018 -   Chief Complaint  Patient presents with  . Follow-up    f/u pulmonary fibrosis, no symptoms, complaints, breathing doind well     HPI Haadi Vadala 74 y.o. -returns for ILD follow-up in the setting of rheumatoid arthritis.  This time he is not here with his wife who has other schedule conflicts.  He tells me overall he stable.  He has now started Rituxan.  As again he is a poor historian.  I have him starting Rituxan a year ago but he told me that it was only started recently and has had only 1 infusion.  He tells me that he is stable.  His cough is well controlled with this anticholinergic he is using nighttime oxygen.  He says he is compliant with his medications.  Last visit I told him to inquire about using Bactrim prophylaxis in the setting of B-cell depletion in the setting of Rituxan.  He has no idea recollection of this conversation.  So I did tell him that we will check  his G6PD and discuss directly with his rheumatologist about starting Bactrim prophylaxis.  [name] okay with this plan.  Overall dyspnea and cough are stable and it is mild.  Walking desaturation test shows stability.     OV 06/29/2019  Subjective:  Patient ID: Eoin Willden, male , DOB: 1946-03-27 , age 13 y.o. , MRN: [id number] , ADDRESS: [address] [place] [address]   Follow-up interstitial lung disease UIP pattern associated with rheumatoid arthritis. Rx Rituxan since approx Oct/Nov 2018. On Background Immuran, prednisone and dapsone for pcp prophylaxois = high risk medication use   06/29/2019 - ILD- RA  followup    HPI Jarmon Huckaba 74 y.o. -presents for follow-up.  I personally saw him December 2019 before the onset of the pandemic.  He tells me that he is doing fair.  He feels he is overall stable.  He continues to be on Rituxan.  He tells me the last dose of this infusion was in May 05, 2019.  This is managed by rheumatology.  In addition he is on Imuran, prednisone.  Also by middle of year my nurse practitioner started him on dapsone for PCP prophylaxis.  He did have a high-resolution CT chest that shows UIP findings and stability since 2018.  However, he did desaturate with exertion when he saw the nurse practitioner just over a month ago.  This is a new finding as documented below.  We walked him again today and again found his desaturation with exertion.  It required 5 L to correct.  In past he has done well with RA at times but at times required 2L. He denies any fever or sputum production.  His previous echocardiogram was in 2013.  Most recent CT scan of the chest notes coronary artery calcification   Noted WBC going down after dapsine start Results for AMIRE, LEAZER (MRN [id number]) as of 06/29/2019 10:19  Ref. Range 05/30/2019 10:17 06/07/2019 09:07 06/13/2019 08:59 06/20/2019 09:50  WBC Latest Ref Range: 4.0 - 10.5 K/uL 8.8 6.2 4.3 3.4 (  L)     IMPRESSION: HRCT July 2020 1. No  significant interval change in severe pulmonary fibrosis, which is heterogeneously distributed and most severe in the right lung base, featuring extensive honeycombing and mild traction bronchiectasis. Although findings are not significantly changed compared to most recent examination dated 2018, they are clearly progressed over time in comparison to priors dating back to 2013. Findings remain in a UIP pattern by ATS pulmonary fibrosis criteria  2.  Coronary artery disease.   Electronically Signed   By: Eddie Candle [name]   On: 05/12/2019 13:14  ROS - per HPI   OV 01/04/2020  Subjective:  Patient ID: [name], male , DOB: [birth year] , age 56 y.o. , MRN: [id number] , [address] [place] [address]   01/04/2020 -   Chief Complaint  Patient presents with  . Follow-up    Pt states he has been doing good since last visit. States he still has an occ cough.    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 Rx Rituxan since approx Oct/Nov 2018. On Background Immuran, prednisone and  = high risk medication use     HPI Ariv Ding 74 y.o. -returns for follow-up.  Last seen in the summer 2020.  Overall he is doing stable.  He continues with his immunosuppressive medications through rheumatology.  This include Imuran prednisone and Rituxan.  He had his infusion of Rituxan this week.  He feels stable but his pulmonary function test done for this visit shows a decline.  It is documented below his walking desaturation test also is a decline from the past but similar to last visit 6 months or so ago.  His main symptom is chronic cough.  [name] labeled it as a level 3 out of 5 but it appears it might actually be more severe.  [name] asking for palliative relief of cough.  His last liver function test with [place] was in the summer 2020.  It appears per review of the chart in the interim he has seen cardiology Dr. Percival Spanish for his coronary artery  calcification and is on expectant follow-up for that.  No stress test has been recommended because of the lack of chest pain.  His wife who is usually here with him is not here today.  He said she slept in.  [name] on MDI - no emphysema noted in prior CT   SYMPTOM SCALE - ILD 06/29/2019  01/04/2020   O2 use Uses 3-5L with exertion yuses 3-5L with exertion  Shortness of Breath 0 -> 5 scale with 5 being worst (score 6 If unable to do)   At rest 3 0  Simple tasks - showers, clothes change, eating, shaving 2 1  Household (dishes, doing bed, laundry) 2 1  Shopping 2 1  Walking level at own pace 2 2  Walking up Stairs 3 3  Total (40 - 48) Dyspnea Score 14 8  How bad is your cough? 3 3  How bad is your fatigue 2 1  diarrhea  0  anxiety  0  deipression  0       Simple office walk 08/01/2018 -  185 feet x  3 laps goal with forehead probe at [place]  10/06/2018 250 feet x 3 laps 06/29/2019  01/04/2020   O2 used Room air Room air Room air Room air  Number laps completed 3 3 Aim for 3 1 Did only 1 of 3  Comments about  pace normal normal  avg pace  Resting Pulse Ox/HR 100% and 80/min 100% and 81/min 94% and 96/min 96% and 101/min  Final Pulse Ox/HR 94% and 114/min 91% and 111/min 86% at first lap, HR 131 86% and 118/min  Desaturated </= 88% no no  yes  Desaturated <= 3% points yes yes  yes  Got Tachycardic >/= 90/min yes yes  yes  Symptoms at end of test x x  Mild dyspnea  Miscellaneous comments x x Needed 5L [place] to correct     Results for CAROLYN, MANISCALCO (MRN [id number]) as of 01/04/2020 09:36  Ref. Range 01/23/2014 15:38 10/01/2014 11:24 07/29/2015 11:09 12/18/2016 09:34 09/27/2017 09:33 12/20/2017 11:29 08/01/2018 09:39 12/26/2019 08:54  FVC-Pre Latest Units: L 2.18 2.05 2.17 2.21 2.06 2.10 2.09 1.96  FVC-%Pred-Pre Latest Units: % 66 62 66 68 64 65 69 61   Results for KRISTIAN, MOGG (MRN [id number]) as of 01/04/2020 09:36  Ref. Range 01/23/2014 15:38 10/01/2014 11:24 07/29/2015 11:09 12/18/2016  09:34 09/27/2017 09:33 12/20/2017 11:29 08/01/2018 09:39 12/26/2019 08:54  DLCO unc Latest Units: ml/min/mmHg 17.93 16.86 16.72 13.81 12.14 12.30 13.24 13.14  DLCO unc % pred Latest Units: % 66 62 62 51 45 45 52 58   ROS - per HPI     has a past medical history of Arthritis, Diabetes mellitus, Hypertension, Pneumonia, and Thyroid disease.   reports that he quit smoking about 35 years ago. His smoking use included cigarettes. He has a 5.00 pack-year smoking history. He has never used smokeless tobacco.  Past Surgical History:  Procedure Laterality Date  . KNEE ARTHROSCOPY    . NO PAST SURGERIES      Allergies  Allergen Reactions  . Bactrim [Sulfamethoxazole-Trimethoprim]     Immunization History  Administered Date(s) Administered  . Influenza Split 08/03/2011, 07/03/2012, 09/02/2013, 07/03/2014  . Influenza, High Dose Seasonal PF 08/02/2016, 08/02/2017, 07/25/2018, 07/04/2019  . Influenza,inj,Quad PF,6+ Mos 07/29/2015  . Pneumococcal Conjugate-13 08/01/2018  . Pneumococcal Polysaccharide-23 11/03/2007  . Zoster Recombinat (Shingrix) 04/11/2019    Family History  Problem Relation Age of Onset  . Diabetes Mother   . Stroke Mother      Current Outpatient Medications:  .  Aclidinium Bromide ([name] PRESSAIR) 400 MCG/ACT AEPB, Inhale 1 puff into the lungs 2 (two) times a day., Disp: 6 each, Rfl: 0 .  amLODipine (NORVASC) 10 MG tablet, Take 10 mg by mouth daily., Disp: , Rfl:  .  aspirin 81 MG tablet, Take 81 mg by mouth daily., Disp: , Rfl:  .  atorvastatin (LIPITOR) 20 MG tablet, Take 20 mg by mouth daily., Disp: , Rfl: 2 .  azaTHIOprine (IMURAN) 50 MG tablet, 50 mg. Take two tablets by mouth in the morning and one in the evening, Disp: , Rfl:  .  folic acid (FOLVITE) 324 MCG tablet, Take 400 mcg by mouth daily., Disp: , Rfl:  .  levothyroxine (SYNTHROID, LEVOTHROID) 25 MCG tablet, Take 25 mcg by mouth daily., Disp: , Rfl:  .  metFORMIN (GLUCOPHAGE-XR) 500 MG 24 hr tablet,  Take 1 tablet by mouth 2 (two) times daily., Disp: , Rfl:  .  predniSONE (DELTASONE) 1 MG tablet, Take 3 mg by mouth daily with breakfast., Disp: , Rfl:  .  Tiotropium Bromide Monohydrate (SPIRIVA RESPIMAT) 2.5 MCG/ACT AERS, Inhale 2 puffs into the lungs daily., Disp: 4 g, Rfl: 0 .  NOVOLIN N RELION 100 UNIT/ML injection, Inject 20-22 Units into the skin 2 (two) times a day. 20 units in morning,22 units in  evening., Disp: , Rfl:       Objective:   Vitals:   01/04/20 0919  BP: (!) 144/78  Pulse: (!) 101  SpO2: 96%  Weight: 185 lb 6.4 oz (84.1 kg)  Height: 5' 6" (1.676 m)    Estimated body mass index is 29.92 kg/m as calculated from the following:   Height as of this encounter: 5' 6" (1.676 m).   Weight as of this encounter: 185 lb 6.4 oz (84.1 kg).  _0 @  Filed Weights   01/04/20 0919  Weight: 185 lb 6.4 oz (84.1 kg)     Physical Exam  Alert and oriented x3.  Bilateral bibasal Velcro crackles present extending one fourth of the way through.  Some stigmata of rheumatoid arthritis present.  Visceral obesity present cardiovascular is normal.  Abdomen is soft.        Assessment:       ICD-10-CM   1. 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)  J84.10 Hepatic function panel  2. Interstitial lung disease due to connective tissue disease ([place])  J84.89    M35.9   3. Therapeutic drug monitoring  Z51.81   4. Chronic respiratory failure with hypoxia (HCC)  J96.11   5. Immunosuppressed status ([place])  D84.9   6. Chronic cough  R05        Plan:     Patient Instructions     ICD-10-CM   1. 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 Interstitial lung disease due to connective tissue disease ([place])  J84.89    M35.9   3. Therapeutic drug monitoring  Z51.81   4. Chronic respiratory failure with hypoxia (HCC)  J96.11   5. Immunosuppressed status ([place])  D84.9   6. Chronic cough  R05     As you know you have pulmonary fibrosis due to your  rheumatoid arthritis Despite strong medications for your rheumatoid your lungs continue to weaken Your cough iis directly related to your pulmonary fibrosis  Plan - continue your RA medications from Dr Kathlene November  - continue o2 with exertion and night - check LFT 01/04/2020 - start ofev 174m twice daily   - this drug is to prevent lung from getting weaker but will not help cough or shortness of breath  - it can cause diarrhea, nausea, weight loss,   -  you need to monitor liver tests  - there are other rarer side efects from bleeding to heart attack risk   - at this point benefit outweighs risk  - suggest you consider joining the ILD-PRO registry study if you qualify   - you toook copy of consent from [facility] staff   - for cough - start hycodan 551monce daily as needed  Followup - research will call you for research appointment  - 5-6 weeks witth Dr RaChase Caller 15 min slot with NP or Dr RaChase Callero review ofev tolerance    ( Level 05 visit: estab 40-54 min  in  visit type: on-site physical face to visit  in total care time and counseling or/and coordination of care by this undersigned MD - Dr MuBrand MalesThis includes one or more of the following on this same day 01/04/2020: pre-charting, chart review, note writing, documentation discussion of test results, diagnostic or treatment recommendations, prognosis, risks and benefits of management options, instructions, education, compliance or risk-factor reduction. It excludes time spent by the [facility] office staff in the care of the  patient. Actual time 40 min)    SIGNATURE    Dr. Brand Males, [name], [name],  Pulmonary and Critical Care Medicine Staff Physician, [place] Director - Interstitial Lung Disease  Program  [facility] [place] [facility] [place], [place], [address]  Pager: [phone], If no answer or between  15:00h - 7:00h: call [phone] Telephone: [phone]  10:05 AM 01/04/2020

## 2020-01-04 NOTE — Telephone Encounter (Signed)
Received notification from [facility] regarding a prior authorization for [place]. Authorization has been APPROVED from 01/04/20 to 11/01/20.   Authorization # [id number] Phone # [phone]  Ran test claim, patient's copay for 1 month is $2,765.48. Patient too patient assistance form home to complete and return.

## 2020-01-04 NOTE — Telephone Encounter (Signed)
Submitted a Prior Authorization request to [facility] for OFEV 146m via Cover My Meds. Will update once we receive a response.  (Key:[name] [id number] -- [id number]

## 2020-01-04 NOTE — Telephone Encounter (Signed)
Received [employer] Start Paperwork. Will update as we work through the benefits process.  10:33 AM Beatriz Chancellor, CPhT

## 2020-01-09 NOTE — Telephone Encounter (Signed)
Patient returned calls and advised that he mailed forms. When I asked if he could give me an estimated household income to get patient enrolled into a grant, patient again stated that he could not. I believe patient may be confusing [place] with [facility] team who is also working with patient for patient assistance. Left message for Etter Sjogren to see they can assist.  10:29 AM Beatriz Chancellor, CPhT

## 2020-01-09 NOTE — Telephone Encounter (Signed)
Left patient a message to follow up on information needed to apply for PF grant foundation.

## 2020-01-10 NOTE — Telephone Encounter (Signed)
Received income documents form [employer] and was able to successfully enroll patient for $9,000 PF [employer] Grant for copay assistance. Approval dates are 12/11/19- 12/09/20  Pharmacy Card [id number]  [id number]  11:25 AM Beatriz Chancellor, CPhT

## 2020-01-11 NOTE — Telephone Encounter (Signed)
Called patient to schedule [facility] Start visit. Patient wanted appointment on Monday 3/15, since he will be [facility] for another MD appt.   Scheduled appointment for 01/15/20 at 10am.  10:05 AM Beatriz Chancellor, CPhT

## 2020-01-12 NOTE — Progress Notes (Signed)
Subjective:  Patient presents today to [facility] to see pharmacy team for Ofev initiation and monitoring.   Patient was last seen and referred by Dr. Chase Caller, on 01/04/2020. Pertinent past medical history includes RA-ILD, chronic respiratory failure with hypoxia, HTN, coronary artery calcification, aoritc atherosclerosis, hypothyroidism, RA.[name] Kitchen Patient is treatment naive for ILD. Patient has been approved for [employer] for Ofev, therefore, copay will be $0. Of note, patient works at [employer] everyday between 1:00PM-5:00PM.  Patient presents today for initial appt with pharmacy team. Patient does not have any questions regarding ILD and/or Ofev management. He confirms cell and home phone number in his chart.  Objective: Allergies  Allergen Reactions  . Bactrim [Sulfamethoxazole-Trimethoprim]     Patient states "got short winded"    Outpatient Encounter Medications as of 01/15/2020  Medication Sig  . amLODipine (NORVASC) 10 MG tablet Take 10 mg by mouth daily.  [name] Kitchen aspirin 81 MG tablet Take 81 mg by mouth daily.  [name] Kitchen atorvastatin (LIPITOR) 20 MG tablet Take 20 mg by mouth daily.  [name] Kitchen azaTHIOprine (IMURAN) 50 MG tablet 50 mg. Take two tablets by mouth in the morning  . folic acid (FOLVITE) 208 MCG tablet Take 400 mcg by mouth daily.  [name] Kitchen HYDROcodone-homatropine (HYCODAN) 5-1.5 MG/5ML syrup Take 5 mLs by mouth daily as needed for cough.  . levothyroxine (SYNTHROID, LEVOTHROID) 25 MCG tablet Take 25 mcg by mouth daily.  . metFORMIN (GLUCOPHAGE-XR) 500 MG 24 hr tablet Take 2 tablets by mouth 2 (two) times daily.   . predniSONE (DELTASONE) 1 MG tablet Take 1 mg by mouth in the morning, at noon, and at bedtime.   . Tiotropium Bromide Monohydrate (SPIRIVA RESPIMAT) 2.5 MCG/ACT AERS Inhale 2 puffs into the lungs daily.  . pantoprazole (PROTONIX) 40 MG tablet Take 40 mg by mouth daily.  . [DISCONTINUED] Aclidinium Bromide ([name] PRESSAIR) 400 MCG/ACT AEPB Inhale 1 puff into the lungs 2 (two)  times a day.  . [DISCONTINUED] NOVOLIN N RELION 100 UNIT/ML injection Inject 20-22 Units into the skin 2 (two) times a day. 20 units in morning,22 units in evening.   No facility-administered encounter medications on file as of 01/15/2020.     Immunization History  Administered Date(s) Administered  . Influenza Split 08/03/2011, 07/03/2012, 09/02/2013, 07/03/2014  . Influenza, High Dose Seasonal PF 08/02/2016, 08/02/2017, 07/25/2018, 07/04/2019  . Influenza,inj,Quad PF,6+ Mos 07/29/2015  . PFIZER SARS-COV-2 Vaccination 01/13/2020  . Pneumococcal Conjugate-13 08/01/2018  . Pneumococcal Polysaccharide-23 11/03/2007  . Zoster Recombinat (Shingrix) 04/11/2019      PFT's TLC  Date Value Ref Range Status  07/29/2015 3.91 L Final      CMP     Component Value Date/Time   NA 139 05/30/2019 1017   K 3.6 05/30/2019 1017   CL 101 05/30/2019 1017   CO2 29 05/30/2019 1017   GLUCOSE 157 (H) 05/30/2019 1017   BUN 13 05/30/2019 1017   CREATININE 0.91 05/30/2019 1017   CREATININE 1.21 (H) 10/17/2018 1641   CALCIUM 8.4 05/30/2019 1017   PROT 7.5 01/04/2020 1026   ALBUMIN 3.8 01/04/2020 1026   AST 19 01/04/2020 1026   ALT 15 01/04/2020 1026   ALKPHOS 42 01/04/2020 1026   BILITOT 0.5 01/04/2020 1026   GFRNONAA >60 05/26/2019 1235   GFRAA >60 05/26/2019 1235      CBC    Component Value Date/Time   WBC 3.1 (L) 01/04/2020 1026   RBC 3.95 (L) 01/04/2020 1026   HGB 12.5 (L) 01/04/2020 1026   HCT 37.4 (  L) 01/04/2020 1026   PLT 254.0 01/04/2020 1026   MCV 94.8 01/04/2020 1026   MCH 30.7 05/23/2019 1118   MCHC 33.3 01/04/2020 1026   RDW 16.9 (H) 01/04/2020 1026   LYMPHSABS 0.5 (L) 01/04/2020 1026   MONOABS 0.6 01/04/2020 1026   EOSABS 0.1 01/04/2020 1026   BASOSABS 0.0 01/04/2020 1026      LFT's Hepatic Function Latest Ref Rng & Units 01/04/2020 06/27/2019 05/30/2019  Total Protein 6.0 - 8.3 g/dL 7.5 6.4 7.0  Albumin 3.5 - 5.2 g/dL 3.8 3.7 3.6  AST 0 - 37 U/L 19 21 35  ALT 0 -  53 U/L 15 14 36  Alk Phosphatase 39 - 117 U/L 42 43 30(L)  Total Bilirubin 0.2 - 1.2 mg/dL 0.5 0.9 0.8  Bilirubin, Direct 0.0 - 0.3 mg/dL 0.1 0.3 -      HRCT (July 2020) 1. No significant interval change in severe pulmonary fibrosis, which is heterogeneously distributed and most severe in the right   Assessment and Plan  1. Nintedanib (Ofev) Medication Management Ofev dose will be 150 mg capsule every 12 hours with food. Stressed importance of taking with food to minimize stomach upset. Patient counseled on purpose, proper use, and potential adverse effects including diarrhea, nausea, vomiting, abdominal pain, decreased appetite, weight loss, and increased blood pressure. Stressed the importance of routine lab monitoring. Will monitor LFT's every month for the first 6 months of treatment then every 3 months. Will monitor CBC every 3 months. Most recent LFTs are stable, therefore, patient is safe to initiate treatment. Upon review of his CBC discussed with patient his RBC, Hgb, and Hct appeared to be low. Advised patient to follow up with his PCP regarding these lab values. Platelets appear to be stable. Patient verbalized understanding. Patient has follow up appt with Dr. Chase Caller in 02/2020.  2. Medication Reconciliation A drug regimen assessment was performed, including review of allergies, interactions, disease-state management, dosing and immunization history. Medications were reviewed with the patient, including name, instructions, indication, goals of therapy, potential side effects, importance of adherence, and safe use. No concerning drug interactions identified.  --Folic acid: Patient states he is taking folic acid daily. He states he previously was on MTX. Discussed with patient he is not required to administer Imuran with folic acid and can discontinue folic acid. Patient verbalized understanding. --Novolin N: Patient states his PCP discontinued Novolin since his most recent A1c was  controlled. He confirms he solely takes metformin for DM management. Discontinued for medication list.   3. Immunizations Patient is up to date on vaccinations. He will be receiving second dose of COVID-19 vaccine on 02/05/2020.  This appointment required 45 minutes of patient care (this includes precharting, chart review, review of results, face-to-face care, etc.).  Thank you for involving pharmacy to assist in providing this patient's care.   Drexel Iha, PharmD PGY2 Ambulatory Care Pharmacy Resident

## 2020-01-13 ENCOUNTER — Ambulatory Visit: Payer: Medicare Other | Attending: Internal Medicine

## 2020-01-13 DIAGNOSIS — Z23 Encounter for immunization: Secondary | ICD-10-CM

## 2020-01-13 NOTE — Progress Notes (Signed)
   Covid-19 Vaccination Clinic  Name:  [name]    MRN: [id number] DOB: Jan 30, 1946  01/13/2020  [name] was observed post Covid-19 immunization for 15 minutes without incident. He was provided with Vaccine Information Sheet and instruction to access the V-Safe system.   [name] was instructed to call 911 with any severe reactions post vaccine: [name] Kitchen Difficulty breathing  . Swelling of face and throat  . A fast heartbeat  . A bad rash all over body  . Dizziness and weakness   Immunizations Administered    Name Date Dose VIS Date Route   Pfizer COVID-19 Vaccine 01/13/2020  8:36 AM 0.3 mL 10/13/2019 Intramuscular   Manufacturer: [place]   Lot: [id number]   [place]: [id number]

## 2020-01-15 ENCOUNTER — Other Ambulatory Visit: Payer: Self-pay

## 2020-01-15 ENCOUNTER — Ambulatory Visit (INDEPENDENT_AMBULATORY_CARE_PROVIDER_SITE_OTHER): Payer: Medicare Other | Admitting: Pharmacist

## 2020-01-15 DIAGNOSIS — M359 Systemic involvement of connective tissue, unspecified: Secondary | ICD-10-CM | POA: Diagnosis not present

## 2020-01-15 DIAGNOSIS — J849 Interstitial pulmonary disease, unspecified: Secondary | ICD-10-CM | POA: Diagnosis not present

## 2020-01-15 DIAGNOSIS — J8489 Other specified interstitial pulmonary diseases: Secondary | ICD-10-CM

## 2020-01-15 DIAGNOSIS — M150 Primary generalized (osteo)arthritis: Secondary | ICD-10-CM | POA: Diagnosis not present

## 2020-01-15 DIAGNOSIS — M0589 Other rheumatoid arthritis with rheumatoid factor of multiple sites: Secondary | ICD-10-CM | POA: Diagnosis not present

## 2020-01-15 DIAGNOSIS — Z79899 Other long term (current) drug therapy: Secondary | ICD-10-CM | POA: Diagnosis not present

## 2020-01-15 DIAGNOSIS — Z13820 Encounter for screening for osteoporosis: Secondary | ICD-10-CM | POA: Diagnosis not present

## 2020-01-15 MED ORDER — OFEV 150 MG PO CAPS: 150.0000 mg | ORAL_CAPSULE | Freq: Two times a day (BID) | ORAL | 11 refills | Status: DC

## 2020-01-15 NOTE — Patient Instructions (Signed)
It was a pleasure seeing you in clinic today!  Today the plan is... 1. We will plan to start Ofev one pill twice daily. A specialty pharmacy ([facility]) will be calling you to discuss how to set up shipment.  Please call the PharmD clinic at [phone] if you have any questions that you would like to speak with a pharmacist about [name], Museum/gallery conservator).

## 2020-01-16 ENCOUNTER — Telehealth: Payer: Self-pay | Admitting: Internal Medicine

## 2020-01-16 NOTE — Telephone Encounter (Signed)
I printed medication list and  Faxed to the number given from [facility]. Nothing further is needed.

## 2020-01-22 ENCOUNTER — Other Ambulatory Visit: Payer: Self-pay | Admitting: Pharmacy Technician

## 2020-01-22 NOTE — Patient Outreach (Signed)
[facility]) Care Management  01/22/2020  Cap Massi 1946-01-20 [id number]    Follow up call placed to [employer] regarding patient assistance application(s) for Spiriva Respimat , Horris Latino confirms that LIS denial was received and that patients approval has been extended to 10/22/2020.   Unsuccessful call attempt place to patient to inform or extended approval, HIPAA compliant voicemail left.  Will make additional call attempt in 3-5 business days.  Maud Deed Chana Bode [place] Certified Pharmacy Technician [place] Management Direct Dial:[phone]

## 2020-01-29 ENCOUNTER — Telehealth: Payer: Self-pay | Admitting: Pharmacist

## 2020-01-29 DIAGNOSIS — M359 Systemic involvement of connective tissue, unspecified: Secondary | ICD-10-CM

## 2020-01-29 DIAGNOSIS — J8489 Other specified interstitial pulmonary diseases: Secondary | ICD-10-CM

## 2020-01-29 NOTE — Telephone Encounter (Signed)
Called patient on 01/29/2020 at 10:50 AM   Patient confirms he has received Ofev from [facility] without any issues. He also confirms he has started taking Ofev and has not experienced any side effects. Advised patient to contact [facility] care to speak with pharmacist in case he does experience side effects. Patient verbalized understanding. Reminded patient he has follow up office visit appt with Dr. Chase Caller on 02/15/2020 at 9:15 AM and that he will need to get blood drawn to assess liver fxn. Also, reminded patient that he will need to get monthly lab work for first 6 months of Ofev treatment. Ordered CMP for 02/15/2020. Patient verbalized understanding and expressed appreciation for the call.   Thank you for involving pharmacy to assist in providing this patient's care.   Drexel Iha, PharmD PGY2 Ambulatory Care Pharmacy Resident

## 2020-02-01 DIAGNOSIS — J129 Viral pneumonia, unspecified: Secondary | ICD-10-CM | POA: Diagnosis not present

## 2020-02-01 DIAGNOSIS — R0902 Hypoxemia: Secondary | ICD-10-CM | POA: Diagnosis not present

## 2020-02-05 ENCOUNTER — Ambulatory Visit: Payer: Medicare Other

## 2020-02-05 ENCOUNTER — Ambulatory Visit: Payer: Medicare Other | Attending: Internal Medicine

## 2020-02-05 DIAGNOSIS — Z23 Encounter for immunization: Secondary | ICD-10-CM

## 2020-02-05 NOTE — Progress Notes (Signed)
   Covid-19 Vaccination Clinic  Name:  Audi Wettstein    MRN: [id number] DOB: Oct 05, 1946  02/05/2020  Mr. Scheffel was observed post Covid-19 immunization for 15 minutes without incident. He was provided with Vaccine Information Sheet and instruction to access the V-Safe system.   Mr. Xiang was instructed to call 911 with any severe reactions post vaccine: [name] Kitchen Difficulty breathing  . Swelling of face and throat  . A fast heartbeat  . A bad rash all over body  . Dizziness and weakness   Immunizations Administered    Name Date Dose VIS Date Route   Pfizer COVID-19 Vaccine 02/05/2020  1:35 PM 0.3 mL 10/13/2019 Intramuscular   Manufacturer: [employer]   Lot: [id number]   [place]: [id number]

## 2020-02-07 DIAGNOSIS — J8410 Pulmonary fibrosis, unspecified: Secondary | ICD-10-CM | POA: Diagnosis not present

## 2020-02-07 DIAGNOSIS — E782 Mixed hyperlipidemia: Secondary | ICD-10-CM | POA: Diagnosis not present

## 2020-02-07 DIAGNOSIS — Z0000 Encounter for general adult medical examination without abnormal findings: Secondary | ICD-10-CM | POA: Diagnosis not present

## 2020-02-07 DIAGNOSIS — R39198 Other difficulties with micturition: Secondary | ICD-10-CM | POA: Diagnosis not present

## 2020-02-07 DIAGNOSIS — I10 Essential (primary) hypertension: Secondary | ICD-10-CM | POA: Diagnosis not present

## 2020-02-07 DIAGNOSIS — E1065 Type 1 diabetes mellitus with hyperglycemia: Secondary | ICD-10-CM | POA: Diagnosis not present

## 2020-02-13 DIAGNOSIS — J8410 Pulmonary fibrosis, unspecified: Secondary | ICD-10-CM | POA: Diagnosis not present

## 2020-02-13 DIAGNOSIS — Z0000 Encounter for general adult medical examination without abnormal findings: Secondary | ICD-10-CM | POA: Diagnosis not present

## 2020-02-13 DIAGNOSIS — E1065 Type 1 diabetes mellitus with hyperglycemia: Secondary | ICD-10-CM | POA: Diagnosis not present

## 2020-02-13 DIAGNOSIS — I700 Atherosclerosis of aorta: Secondary | ICD-10-CM | POA: Diagnosis not present

## 2020-02-13 DIAGNOSIS — M0589 Other rheumatoid arthritis with rheumatoid factor of multiple sites: Secondary | ICD-10-CM | POA: Diagnosis not present

## 2020-02-15 ENCOUNTER — Other Ambulatory Visit: Payer: Self-pay

## 2020-02-15 ENCOUNTER — Ambulatory Visit: Payer: Medicare Other | Admitting: Internal Medicine

## 2020-02-15 ENCOUNTER — Encounter: Payer: Self-pay | Admitting: Internal Medicine

## 2020-02-15 ENCOUNTER — Encounter: Payer: Medicare Other | Admitting: Internal Medicine

## 2020-02-15 VITALS — BP 140/60 | HR 105 | Temp 97.4°F | Ht 66.0 in | Wt 185.6 lb

## 2020-02-15 DIAGNOSIS — J849 Interstitial pulmonary disease, unspecified: Secondary | ICD-10-CM

## 2020-02-15 DIAGNOSIS — J8410 Pulmonary fibrosis, unspecified: Secondary | ICD-10-CM | POA: Diagnosis not present

## 2020-02-15 DIAGNOSIS — J9611 Chronic respiratory failure with hypoxia: Secondary | ICD-10-CM

## 2020-02-15 DIAGNOSIS — M359 Systemic involvement of connective tissue, unspecified: Secondary | ICD-10-CM

## 2020-02-15 DIAGNOSIS — J8489 Other specified interstitial pulmonary diseases: Secondary | ICD-10-CM

## 2020-02-15 DIAGNOSIS — D849 Immunodeficiency, unspecified: Secondary | ICD-10-CM | POA: Diagnosis not present

## 2020-02-15 DIAGNOSIS — Z5181 Encounter for therapeutic drug level monitoring: Secondary | ICD-10-CM

## 2020-02-15 LAB — HEPATIC FUNCTION PANEL
ALT: 32 U/L (ref 0–53)
AST: 37 U/L (ref 0–37)
Albumin: 3.9 g/dL (ref 3.5–5.2)
Alkaline Phosphatase: 59 U/L (ref 39–117)
Bilirubin, Direct: 0.1 mg/dL (ref 0.0–0.3)
Total Bilirubin: 0.4 mg/dL (ref 0.2–1.2)
Total Protein: 7.2 g/dL (ref 6.0–8.3)

## 2020-02-15 LAB — COMPREHENSIVE METABOLIC PANEL
ALT: 32 U/L (ref 0–53)
AST: 37 U/L (ref 0–37)
Albumin: 3.9 g/dL (ref 3.5–5.2)
Alkaline Phosphatase: 59 U/L (ref 39–117)
BUN: 12 mg/dL (ref 6–23)
CO2: 29 mEq/L (ref 19–32)
Calcium: 8.8 mg/dL (ref 8.4–10.5)
Chloride: 101 mEq/L (ref 96–112)
Creatinine, Ser: 0.83 mg/dL (ref 0.40–1.50)
GFR: 109.34 mL/min (ref >60.00)
Glucose, Bld: 166 mg/dL — ABNORMAL HIGH (ref 70–99)
Potassium: 3.9 mEq/L (ref 3.5–5.1)
Sodium: 138 mEq/L (ref 135–145)
Total Bilirubin: 0.4 mg/dL (ref 0.2–1.2)
Total Protein: 7.2 g/dL (ref 6.0–8.3)

## 2020-02-15 NOTE — Research (Signed)
Title: Chronic Fibrosing Interstitial Lung Disease with Progressive Phenotype Prospective Outcomes (ILD-PRO) Registry   Protocol #: IPF-PRO-SUB, Clinical Trials # [id number], Sponsor: [employer]  Protocol Version Amendment 4 dated 12Sep2019  and confirmed current on 02/15/2020 Consent Version for today's visit date of 02/15/2020 is Version [id number]  Objectives:  [name] Kitchen Describe current approaches to diagnosis and treatment of chronic fibrosing ILDs with progressive phenotype  . Describe the natural history of chronic fibrosing ILDs with progressive phenotype  . Assess quality of life from self-administered participant reported questionnaires for each disease group  . Describe participant interactions with the healthcare system, describe treatment practices across multiple institutions for each disease group  . Collect biological samples linked to well characterized chronic fibrosing ILDs with progressive phenotype to identify disease biomarkers  . Collect data and biological samples that will support future research studies.                                            Key Inclusion Criteria: Willing and able to provide informed consent  Age ? 30 years  Diagnosis of a non-IPF ILD of any duration, including, but not limited to Idiopathic Non-Specific Interstitial Pneumonia (INSIP), Unclassifiable Idiopathic Interstitial Pneumonias (IIPs), Interstitial Pneumonia with Autoimmune Features (IPAF), Autoimmune ILDs such as Rheumatoid Arthritis (RA-ILD) and Systemic Sclerosis (SSC-ILD), Chronic Hypersensitivity Pneumonitis (HP), Sarcoidosis or Exposure-related ILDs such as asbestosis.  Chronic fibrosing ILD defined by reticular abnormality with traction bronchiectasis with or without honeycombing confirmed by chest HRCT scan and/or lung biopsy.  Progressive phenotype as defined by fulfilling at least one of the criteria below of fibrotic changes (progression set point) within the last  24 months regardless of treatment considered appropriate in individual ILDs:  . decline in FVC % predicted (% pred) based on >10% relative decline  . decline in FVC % pred based on ? 5 - <10% relative decline in FVC combined with worsening of respiratory symptoms as assessed by the site investigator  . decline in FVC % pred based on ? 5 - <10% relative decline in FVC combined with increasing extent of fibrotic changes on chest imaging (HRCT scan) as assessed by the site investigator  . decline in DLCO % pred based on ? 10% relative decline  . worsening of respiratory symptoms as well as increasing extent of fibrotic changes on chest imaging (HRCT scan) as assessed by the site investigator independent of FVC change.    Key Exclusion Criteria: Malignancy, treated or untreated, other than skin or early stage prostate cancer, within the past 5 years  Currently listed for lung transplantation at the time of enrollment  Currently enrolled in a clinical trial at the time of enrollment in this registry      Clinical Research Coordinator / Research RN note : This visit for Subject [name] with DOB: 11-22-1945 on 02/15/2020 for the above protocol is Visit/Encounter # Baseline/Enrollment  and is for purpose of research. The consent for this encounter is under Protocol Version Amendment 4 (12Sep2019) and is currently IRB approved. Subject expressed continued interest and consent in continuing as a study subject. Subject confirmed that there was no change in contact information (e.g. address, telephone, email). Subject thanked for participation in research and contribution to science.   During this visit on 02/15/2020, the subject met with me in the office to discuss the protocol ICF and sign. The study coordinator  went over the entire ICF with the subject and the subject was given ample time to read and review the ICF. After review of the ICF,all questions were answered to the subject's satisfaction.Dr.  Chase Caller reviewed the ICF with the patient, explained the purpose of research, and asked if there were any additional questions. He stated that thestudycoordinatorhad explained the study to him thoroughly and stated he had no additional questions. The subject,study coordinator,and PI signed the ICF and the subject was given a signed copy of the ICF for his records. After consent all study related procedures were conducted as per theabovestated protocol. For additional information on today's visit,please refer to subject's paper source binder.    Signed by  [place] Assistant [facility]  [place], [place] 11:21 AM 02/15/2020

## 2020-02-15 NOTE — Addendum Note (Signed)
Addended by: Suzzanne Cloud E on: 02/15/2020 10:41 AM   Modules accepted: Orders

## 2020-02-15 NOTE — Patient Instructions (Addendum)
ICD-10-CM   1. Interstitial lung disease due to connective tissue disease ([place])  J84.89    M35.9   2. 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)  J84.10   3. Therapeutic drug monitoring  Z51.81   4. Chronic respiratory failure with hypoxia (H[facility])  J96.11   5. Immunosuppressed status ([place])  D84.9    Stabile ILD since last visit Tolerating ofev well You meet criteria for ILD-PRO registry study; thanks for agreeing to participate  Plan - continue your RA medications from Dr Kathlene November  - continue o2 with exertion and night - check LFT 02/15/2020 - continue  ofev 134m twice daily  - for cough - continue  hycodan 513monce daily as needed - stop spiriva - meet with [facility] sttaff to consent for ILD-PRO registry  Followup -- 4  weeks witth pharmacist to go through med calendar and medication tolerance and repeat LFT - 8-12 weeks do spirometry/dlco  - return to see DR RaChase Callern 8-12 weeks  - 30 min slot

## 2020-02-15 NOTE — Progress Notes (Signed)
OV 01/01/2017  Chief Complaint  Patient presents with  . Follow-up    Pt here after PFT. Pt states overall his breathing is unchanged since last OV. Pt c/o occ dry cough. Pt deneis CP/tightness and f/c/s.    Follow-up interstitial lung disease UIP pattern associated with rheumatoid arthritis and immune suppression. Last visit in March 2017. He doesn't that overall he has been stable. He has a new rheumatologist but still at [facility] [address] [name]. His wife also says he is stable. However as he started talking to them his wife slowly started telling me that for the last few to several months he is wheezing more than usual and is a little more short of breath than usual. However he denied it. This is compliant with his medications. [name] not on any inhaler therapy. He uses oxygen only at night. Previous CT scans of the chest in September 2016 in March 2017 did not report any associated emphysema. His most recent pulmonary function test is shown that on Combivent. I personally visualized the graph: [name] Kitchen Forced vital capacity is stable with a diffusion capacity seems to decline over time. Walking desaturation test 185 feet 3 laps on room air: He desaturated to 84%. And then we walked him on 2 L oxygen he did not desaturate.   OV 06/23/2017  Chief Complaint  Patient presents with  . Follow-up    pt here after CT in 04/2017. Pt states his breathing is unchanged since last OV. Pt c/o interemittent dry cough. Pt denies CP/tightness and f/c/s.      Follow-up interstitial lung disease UIP pattern associated with rheumatoid arthritis and on Imuran. Last visit in March 2018 I was concerned about progressive interstitial lung disease because he started desaturating on room air and his FVC show decline. Currently rated a high-resolution CT chest that confirmed progressive lung disease. We walked him 185 feet 3 laps on room air and he desaturated at third lap to 89%. This is very  similar to previous visit suggesting that  that his decline has been more gradual over the last 1 year. Compared to last visit he feels stable. His wife feels the same. [name] aware of his worsening fibrosis and the potential need to change anti fibrotics  OV 09/27/2017  Chief Complaint  Patient presents with  . Follow-up    PFT done today. Pt states that he thinks his breathing has become worse. Has complaints of SOB with exertion, occ. dry cough. Denies any CP. States that he does wear O2 at night, DME: [facility].     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  Patient is on Imuran.  [name] having progressive disease this year 2018.  He told my certified medical assistant that he is having progressive shortness of breath but to me he tells me that he is stable.  As always he is a very poor historian.  Wife gives a better history.  He has oxygen at home for the night which she uses but does not use his exertional small portable system even though this helps him.  They do admit that dyspnea is progressive.  In fact pulmonary function test today shows progressive disease.  He also seem to desaturate a little bit more easily.  He continues to be on Imuran.  In talking to him it appears his rheumatologist has now started him on Rituxan.  He has had 2 doses 2 weeks apart with the  most recent one being mid November 2018.  The next dose is at mid May 2019 which is 41-monthinterval.  He says he is up-to-date with his flu shot but is not had a conversation with his primary care physician about the new shingles vaccine that is inactivated vaccine.  [name] not having much of joint issues.  [name] aware that the Rituxan is specifically designed to slow down the progression of pulmonary fibrosis.  Walking desaturation test 185 feet x3 laps on room air with a forehead probe.  Resting heart rate was 95/min.  Final heart rate 108/min.  Resting pulse ox was 95%.  By the time he  finished his second lap he desaturated to 84%.  He was then placed on 2 L nasal cannula and he was able to maintain his saturation to 95%.   OV 12/20/2017  Chief Complaint  Patient presents with  . Follow-up    follow up PFT,SOB with activity/exertion   [name] is here for follow-up because of his progressive UIP pattern pulmonary fibrosis related to rheumatoid arthritis.  At his last visit he was started on Rituxan by rheumatology.  Since then we started him on portable oxygen but he says he is not using it because he does not like to use it.  He does use oxygen at night which is helping.  His next dose of Rituxan is in May 2019.  Both he and his wife attest that since the start of Rituxan his symptoms are stable particularly shortness of breath is not any worse.  In fact pulmonary function test today shows continued ongoing stability with FVC and DLCO would suggest that the Rituxan might be helping him.  There are no new issues.  [name] here to have a shingles vaccine.  I told him about this last visit to talk to primary care physician but he does not remember this.   IMPRESSION: June 2018 1. Continued interval progression of fibrotic interstitial lung disease with extensive honeycombing, asymmetrically involving the right lung, with mild basilar predominance, diagnostic of usual interstitial pneumonia (UIP) . 2. Stable mild reactive mediastinal lymphadenopathy. 3. Aortic atherosclerosis.  One vessel coronary atherosclerosis.   Electronically Signed   By: [name]   On: 04/09/2017 10:51   OV 08/01/2018  Subjective:  Patient ID: [name] , DOB: [birth year], age 74y.o. , MRN: [id number], ADDRESS: [address] [place] [address]  08/01/2018 -   Chief Complaint  Patient presents with  . Follow-up    PFT today, increased coughing (non-productive)    Follow-up interstitial lung disease UIP pattern associated with rheumatoid arthritis. Rx Rituxan since approx  Oct/Nov 2018. Background Immuran  HPI [name] 74y.o. -presents as usual with his wife.  He tells me that he continues on background Imuran.  [name] also on scheduled Rituxan.  He has an infusion coming up in a month or so.  This is according to his wife.  He continues to be a poor historian.  As best as I can gather from both of them is that his shortness of breath is actually a little bit better ever since starting Rituxan..  In fact his PFTs show continued improvement/stability.  With his FVC and DLCO.  However he tells me that his cough might be a little bit worse.  In talking to him it appears that is only mildly worse.  It happens mostly at night when he lies down.  He coughs  a few times and then it does not bother him.  The cough is dry.  He denies any postnasal drip or acid reflux disease.  He does not have any fever or sputum production.       OV 10/06/2018  Subjective:  Patient ID: Ameer Sanden, male , DOB: Nov 11, 1945 , age 65 y.o. , MRN: [id number] , ADDRESS: [address] [place] [address]   10/06/2018 -   Chief Complaint  Patient presents with  . Follow-up    f/u pulmonary fibrosis, no symptoms, complaints, breathing doind well     HPI [name] 74 y.o. -returns for ILD follow-up in the setting of rheumatoid arthritis.  This time he is not here with his wife who has other schedule conflicts.  He tells me overall he stable.  He has now started Rituxan.  As again he is a poor historian.  I have him starting Rituxan a year ago but he told me that it was only started recently and has had only 1 infusion.  He tells me that he is stable.  His cough is well controlled with this anticholinergic he is using nighttime oxygen.  He says he is compliant with his medications.  Last visit I told him to inquire about using Bactrim prophylaxis in the setting of B-cell depletion in the setting of Rituxan.  He has no idea recollection of this conversation.  So I did tell him that we will check  his G6PD and discuss directly with his rheumatologist about starting Bactrim prophylaxis.  [name] okay with this plan.  Overall dyspnea and cough are stable and it is mild.  Walking desaturation test shows stability.     OV 06/29/2019  Subjective:  Patient ID: [name], male , DOB: July 08, 1946 , age 15 y.o. , MRN: [id number] , ADDRESS: [address] [place] [address]   Follow-up interstitial lung disease UIP pattern associated with rheumatoid arthritis. Rx Rituxan since approx Oct/Nov 2018. On Background Immuran, prednisone and dapsone for pcp prophylaxois = high risk medication use   06/29/2019 - ILD- RA  followup    HPI [name] 74 y.o. -presents for follow-up.  I personally saw him December 2019 before the onset of the pandemic.  He tells me that he is doing fair.  He feels he is overall stable.  He continues to be on Rituxan.  He tells me the last dose of this infusion was in May 05, 2019.  This is managed by rheumatology.  In addition he is on Imuran, prednisone.  Also by middle of year my nurse practitioner started him on dapsone for PCP prophylaxis.  He did have a high-resolution CT chest that shows UIP findings and stability since 2018.  However, he did desaturate with exertion when he saw the nurse practitioner just over a month ago.  This is a new finding as documented below.  We walked him again today and again found his desaturation with exertion.  It required 5 L to correct.  In past he has done well with RA at times but at times required 2L. He denies any fever or sputum production.  His previous echocardiogram was in 2013.  Most recent CT scan of the chest notes coronary artery calcification   Noted WBC going down after dapsine start Results for MEER, REINDL (MRN [id number]) as of 06/29/2019 10:19  Ref. Range 05/30/2019 10:17 06/07/2019 09:07 06/13/2019 08:59 06/20/2019 09:50  WBC Latest Ref Range: 4.0 - 10.5 K/uL 8.8 6.2 4.3 3.4 (L)  IMPRESSION: HRCT July 2020 1. No  significant interval change in severe pulmonary fibrosis, which is heterogeneously distributed and most severe in the right lung base, featuring extensive honeycombing and mild traction bronchiectasis. Although findings are not significantly changed compared to most recent examination dated 2018, they are clearly progressed over time in comparison to priors dating back to 2013. Findings remain in a UIP pattern by ATS pulmonary fibrosis criteria  2.  Coronary artery disease.   Electronically Signed   By: Eddie Candle [name]   On: 05/12/2019 13:14  ROS - per HPI   OV 01/04/2020  Subjective:  Patient ID: [name], male , DOB: 07-05-1946 , age 74 y.o. , MRN: [id number] , [address] [place] [address]   01/04/2020 -   Chief Complaint  Patient presents with  . Follow-up    Pt states he has been doing good since last visit. States he still has an occ cough.    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 Rx Rituxan since approx Oct/Nov 2018. On Background Immuran, prednisone and  = high risk medication use     HPI [name] 74 y.o. -returns for follow-up.  Last seen in the summer 2020.  Overall he is doing stable.  He continues with his immunosuppressive medications through rheumatology.  This include Imuran prednisone and Rituxan.  He had his infusion of Rituxan this week.  He feels stable but his pulmonary function test done for this visit shows a decline.  It is documented below his walking desaturation test also is a decline from the past but similar to last visit 6 months or so ago.  His main symptom is chronic cough.  [name] labeled it as a level 3 out of 5 but it appears it might actually be more severe.  [name] asking for palliative relief of cough.  His last liver function test with [place] was in the summer 2020.  It appears per review of the chart in the interim he has seen cardiology Dr. Percival Spanish for his coronary artery  calcification and is on expectant follow-up for that.  No stress test has been recommended because of the lack of chest pain.  His wife who is usually here with him is not here today.  He said she slept in.  [name] on MDI - no emphysema noted in prior CT    ROS - per HPI  OV 02/15/2020  Subjective:  Patient ID: [name], male , DOB: [birth year] , age 3 y.o. , MRN: [id number] , ADDRESS: [place] [address]   02/15/2020 -   Chief Complaint  Patient presents with  . Follow-up    Follow-up interstitial lung disease UIP pattern associated with rheumatoid arthritis.   - Rx Rituxan since approx Oct/Nov 2018. On Background Immuran, prednisone and  = high risk medication use  - started ofev mid -  end of march 2021 for progressive disease    HPI [name] 74 y.o. -he has progressive ILD because of rheumatoid arthritis UIP pattern.  Last visit started him on nintedanib.  [name] now here to follow-up for tolerance and monitoring of nintedanib because this requires intensive therapeutic monitoring.  [name] also immunosuppressed.  He tells me that he is now started nintedanib approximately 1 month ago.  [name] tolerating it fine without any nausea vomiting diarrhea.  Last visit he took the consent [name] for the ILD-pro registry study.  [name]  agreed now to participate in it.  He meets inclusion exclusion criteria because of progressive disease.  At this point in time he is got minimal symptoms.  He desaturated as before.  However he is not compliant with his portable oxygen with exertion.  His cough is improved because of Hycodan.     SYMPTOM SCALE - ILD 06/29/2019  01/04/2020  02/15/2020   O2 use Uses 3-5L with exertion yuses 3-5L with exertion Non compliant  Shortness of Breath 0 -> 5 scale with 5 being worst (score 6 If unable to do)    At rest 3 0 0  Simple tasks - showers, clothes change, eating, shaving 2 1 0  Household (dishes, doing bed, laundry) 2 1 0  Shopping  _0 Walking level at own pace _1 Walking up Stairs _2 Total (40 - 48) Dyspnea Score _3 How bad is your cough? _4 How bad is your fatigue 2 1 0  diarrhea  0 0  anxiety  0 0  deipression  0 0       Simple office walk 08/01/2018 -  185 feet x  3 laps goal with forehead probe at [place]  10/06/2018 250 feet x 3 laps 06/29/2019  01/04/2020  02/15/2020   O2 used _5   Number laps completed 3 3 Aim for 3 1 Did only 1 of 3 Did all 3  Comments about pace normal normal  avg pace avg opae  Resting Pulse Ox/HR 100% and 80/min 100% and 81/min 94% and 96/min 96% and 101/min 99% and 98/min  Final Pulse Ox/HR 94% and 114/min 91% and 111/min 86% at first lap, HR 131 86% and 118/min 88% and 123/min  Desaturated </= 88% no no  yes yes  Desaturated <= 3% points yes yes  yes yes  Got Tachycardic >/= 90/min yes yes  yes yes  Symptoms at end of test x x  Mild dyspnea dyspnea  Miscellaneous comments x x Needed 5L  to correct      Results for [name], [name] (MRN [id number]) as of 01/04/2020 09:36  Ref. Range 01/23/2014 15:38 10/01/2014 11:24 07/29/2015 11:09 12/18/2016 09:34 09/27/2017 09:33 12/20/2017 11:29 08/01/2018 09:39 12/26/2019 08:54  FVC-Pre Latest Units: L 2.18 2.05 2.17 2.21 2.06 2.10 2.09 1.96  FVC-%Pred-Pre Latest Units: % 66 62 66 68 64 65 69 61   Results for [name], [name] (MRN [id number]) as of 01/04/2020 09:36  Ref. Range 01/23/2014 15:38 10/01/2014 11:24 07/29/2015 11:09 12/18/2016 09:34 09/27/2017 09:33 12/20/2017 11:29 08/01/2018 09:39 12/26/2019 08:54  DLCO unc Latest Units: ml/min/mmHg 17.93 16.86 16.72 13.81 12.14 12.30 13.24 13.14  DLCO unc % pred Latest Units: % 66 62 62 51 45 45 52 58   ROS - per HPI     has a past medical history of Arthritis, Diabetes mellitus, Hypertension, Pneumonia, and Thyroid disease.   reports that he quit smoking about 35 years ago. His smoking use included cigarettes. He has a 5.00 pack-year smoking  history. He has never used smokeless tobacco.  Past Surgical History:  Procedure Laterality Date  . KNEE ARTHROSCOPY    . NO PAST SURGERIES      Allergies  Allergen Reactions  . Bactrim [Sulfamethoxazole-Trimethoprim]     Patient states "got short winded"    Immunization History  Administered Date(s) Administered  . Influenza Split 08/03/2011, 07/03/2012, 09/02/2013, 07/03/2014  . Influenza, High  Dose Seasonal PF 08/02/2016, 08/02/2017, 07/25/2018, 07/04/2019  . Influenza,inj,Quad PF,6+ Mos 07/29/2015  . PFIZER SARS-COV-2 Vaccination 01/13/2020, 02/05/2020  . Pneumococcal Conjugate-13 08/01/2018  . Pneumococcal Polysaccharide-23 11/03/2007  . Zoster Recombinat (Shingrix) 04/11/2019    Family History  Problem Relation Age of Onset  . Diabetes Mother   . Stroke Mother      Current Outpatient Medications:  .  amLODipine (NORVASC) 10 MG tablet, Take 10 mg by mouth daily., Disp: , Rfl:  .  aspirin 81 MG tablet, Take 81 mg by mouth daily., Disp: , Rfl:  .  atorvastatin (LIPITOR) 20 MG tablet, Take 20 mg by mouth daily., Disp: , Rfl: 2 .  azaTHIOprine (IMURAN) 50 MG tablet, 50 mg. Take two tablets by mouth in the morning, Disp: , Rfl:  .  folic acid (FOLVITE) 295 MCG tablet, Take 400 mcg by mouth daily., Disp: , Rfl:  .  HYDROcodone-homatropine (HYCODAN) 5-1.5 MG/5ML syrup, Take 5 mLs by mouth daily as needed for cough., Disp: 240 mL, Rfl: 0 .  levothyroxine (SYNTHROID, LEVOTHROID) 25 MCG tablet, Take 25 mcg by mouth daily., Disp: , Rfl:  .  metFORMIN (GLUCOPHAGE-XR) 500 MG 24 hr tablet, Take 2 tablets by mouth 2 (two) times daily. , Disp: , Rfl:  .  Nintedanib (OFEV) 150 MG CAPS, Take 1 capsule (150 mg total) by mouth 2 (two) times daily., Disp: 60 capsule, Rfl: 11 .  pantoprazole (PROTONIX) 40 MG tablet, Take 40 mg by mouth daily., Disp: , Rfl:  .  predniSONE (DELTASONE) 1 MG tablet, Take 1 mg by mouth in the morning, at noon, and at bedtime. , Disp: , Rfl:  .  Tiotropium  Bromide Monohydrate (SPIRIVA RESPIMAT) 2.5 MCG/ACT AERS, Inhale 2 puffs into the lungs daily., Disp: 4 g, Rfl: 0      Objective:   Vitals:   02/15/20 0925  BP: 140/60  Pulse: (!) 105  Temp: (!) 97.4 F (36.3 C)  TempSrc: Temporal  SpO2: 92%  Weight: 185 lb 9.6 oz (84.2 kg)  Height: _0  (1.676 m)    Estimated body mass index is 29.96 kg/m as calculated from the following:   Height as of this encounter: _1  (1.676 m).   Weight as of this encounter: 185 lb 9.6 oz (84.2 kg).  _2 @  Filed Weights   02/15/20 0925  Weight: 185 lb 9.6 oz (84.2 kg)     Physical Exam Visceral obesity present.  Bilateral lower lobe crackles with some rheumatoid arthritis deformities of the fingers.  Otherwise nonfocal exam.  Alert and oriented x3         Assessment:       ICD-10-CM   1. Interstitial lung disease due to connective tissue disease ([facility])  J84.89    M35.9   2. 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)  J84.10   3. Therapeutic drug monitoring  Z51.81   4. Chronic respiratory failure with hypoxia ([facility])  J96.11   5. Immunosuppressed status ([facility])  D84.9        Plan:     Patient Instructions     ICD-10-CM   1. 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)  J84.10   3. Therapeutic drug monitoring  Z51.81   4. Chronic respiratory failure with hypoxia ([facility])  J96.11   5. Immunosuppressed status ([place])  D84.9    Stabile ILD  since last visit Tolerating ofev well You meet criteria for ILD-PRO registry study; thanks for agreeing to participate  Plan - continue your RA medications from Dr Kathlene November  - continue o2 with exertion and night - check LFT 02/15/2020 - continue  ofev 169m twice daily  - for cough - continue  hycodan 583monce daily as needed - stop spiriva - meet with [facility] sttaff to consent for ILD-PRO registry  Followup -- 4  weeks witth pharmacist to  go through med calendar and medication tolerance and repeat LFT - 8-12 weeks do spirometry/dlco  - return to see DR RaChase Callern 8-12 weeks  - 3066in slot     SIGNATURE    Dr. MuBrand MalesM.D., [name],  Pulmonary and Critical Care Medicine Staff Physician, CoWilsonirector - Interstitial Lung Disease  Program  Pulmonary Fi[place]lenco[id number]: [phone] no answer or between  15:00h - 7:00h: call [phone] Telephone: [phone]  9:51 AM 02/15/2020

## 2020-02-16 NOTE — Progress Notes (Signed)
Blood sugars running high like always. Otherwise, LFT normal. Chemistry normal

## 2020-02-17 ENCOUNTER — Other Ambulatory Visit: Payer: Self-pay

## 2020-02-17 ENCOUNTER — Emergency Department (HOSPITAL_COMMUNITY): Payer: Medicare Other

## 2020-02-17 ENCOUNTER — Observation Stay (HOSPITAL_COMMUNITY)
Admission: EM | Admit: 2020-02-17 | Discharge: 2020-02-19 | Disposition: A | Discharge status: Home / Self Care | Payer: Medicare Other | Attending: Family Medicine | Admitting: Family Medicine

## 2020-02-17 ENCOUNTER — Encounter (HOSPITAL_COMMUNITY): Payer: Self-pay

## 2020-02-17 DIAGNOSIS — J9621 Acute and chronic respiratory failure with hypoxia: Secondary | ICD-10-CM

## 2020-02-17 DIAGNOSIS — J8410 Pulmonary fibrosis, unspecified: Principal | ICD-10-CM | POA: Diagnosis present

## 2020-02-17 DIAGNOSIS — Z20822 Contact with and (suspected) exposure to covid-19: Secondary | ICD-10-CM | POA: Insufficient documentation

## 2020-02-17 DIAGNOSIS — R05 Cough: Secondary | ICD-10-CM | POA: Diagnosis not present

## 2020-02-17 DIAGNOSIS — J9620 Acute and chronic respiratory failure, unspecified whether with hypoxia or hypercapnia: Secondary | ICD-10-CM | POA: Diagnosis present

## 2020-02-17 DIAGNOSIS — I700 Atherosclerosis of aorta: Secondary | ICD-10-CM | POA: Diagnosis present

## 2020-02-17 DIAGNOSIS — Z87891 Personal history of nicotine dependence: Secondary | ICD-10-CM | POA: Diagnosis not present

## 2020-02-17 DIAGNOSIS — E119 Type 2 diabetes mellitus without complications: Secondary | ICD-10-CM | POA: Diagnosis not present

## 2020-02-17 DIAGNOSIS — M069 Rheumatoid arthritis, unspecified: Secondary | ICD-10-CM | POA: Diagnosis not present

## 2020-02-17 DIAGNOSIS — J111 Influenza due to unidentified influenza virus with other respiratory manifestations: Secondary | ICD-10-CM

## 2020-02-17 DIAGNOSIS — I10 Essential (primary) hypertension: Secondary | ICD-10-CM | POA: Diagnosis not present

## 2020-02-17 DIAGNOSIS — E039 Hypothyroidism, unspecified: Secondary | ICD-10-CM | POA: Diagnosis present

## 2020-02-17 DIAGNOSIS — J449 Chronic obstructive pulmonary disease, unspecified: Secondary | ICD-10-CM | POA: Insufficient documentation

## 2020-02-17 DIAGNOSIS — K5730 Diverticulosis of large intestine without perforation or abscess without bleeding: Secondary | ICD-10-CM | POA: Insufficient documentation

## 2020-02-17 DIAGNOSIS — R509 Fever, unspecified: Secondary | ICD-10-CM | POA: Diagnosis not present

## 2020-02-17 DIAGNOSIS — R404 Transient alteration of awareness: Secondary | ICD-10-CM | POA: Diagnosis not present

## 2020-02-17 DIAGNOSIS — R0902 Hypoxemia: Secondary | ICD-10-CM | POA: Diagnosis not present

## 2020-02-17 DIAGNOSIS — R0689 Other abnormalities of breathing: Secondary | ICD-10-CM | POA: Diagnosis not present

## 2020-02-17 HISTORY — DX: Chronic obstructive pulmonary disease, unspecified: J44.9

## 2020-02-17 LAB — PROTIME-INR
INR: 1.1 (ref 0.8–1.2)
Prothrombin Time: 14.2 seconds (ref 11.4–15.2)

## 2020-02-17 LAB — COMPREHENSIVE METABOLIC PANEL
ALT: 134 U/L — ABNORMAL HIGH (ref 0–44)
AST: 168 U/L — ABNORMAL HIGH (ref 15–41)
Albumin: 3.3 g/dL — ABNORMAL LOW (ref 3.5–5.0)
Alkaline Phosphatase: 84 U/L (ref 38–126)
Anion gap: 9 (ref 5–15)
BUN: 11 mg/dL (ref 8–23)
CO2: 29 mmol/L (ref 22–32)
Calcium: 8 mg/dL — ABNORMAL LOW (ref 8.9–10.3)
Chloride: 99 mmol/L (ref 98–111)
Creatinine, Ser: 1.14 mg/dL (ref 0.61–1.24)
GFR calc Af Amer: >60 mL/min (ref >60)
GFR calc non Af Amer: >60 mL/min (ref >60)
Glucose, Bld: 150 mg/dL — ABNORMAL HIGH (ref 70–99)
Potassium: 4.2 mmol/L (ref 3.5–5.1)
Sodium: 137 mmol/L (ref 135–145)
Total Bilirubin: 1 mg/dL (ref 0.3–1.2)
Total Protein: 6.7 g/dL (ref 6.5–8.1)

## 2020-02-17 LAB — CBC WITH DIFFERENTIAL/PLATELET
Abs Immature Granulocytes: 0.02 10*3/uL (ref 0.00–0.07)
Basophils Absolute: 0 10*3/uL (ref 0.0–0.1)
Basophils Relative: 0 %
Eosinophils Absolute: 0.1 10*3/uL (ref 0.0–0.5)
Eosinophils Relative: 1 %
HCT: 38.7 % — ABNORMAL LOW (ref 39.0–52.0)
Hemoglobin: 12 g/dL — ABNORMAL LOW (ref 13.0–17.0)
Immature Granulocytes: 1 %
Lymphocytes Relative: 9 %
Lymphs Abs: 0.4 10*3/uL — ABNORMAL LOW (ref 0.7–4.0)
MCH: 30.9 pg (ref 26.0–34.0)
MCHC: 31 g/dL (ref 30.0–36.0)
MCV: 99.7 fL (ref 80.0–100.0)
Monocytes Absolute: 0.3 10*3/uL (ref 0.1–1.0)
Monocytes Relative: 8 %
Neutro Abs: 3.3 10*3/uL (ref 1.7–7.7)
Neutrophils Relative %: 81 %
Platelets: 206 10*3/uL (ref 150–400)
RBC: 3.88 MIL/uL — ABNORMAL LOW (ref 4.22–5.81)
RDW: 16.1 % — ABNORMAL HIGH (ref 11.5–15.5)
WBC: 4 10*3/uL (ref 4.0–10.5)
nRBC: 0 % (ref 0.0–0.2)

## 2020-02-17 LAB — RESPIRATORY PANEL BY RT PCR (FLU A&B, COVID)
Influenza A by PCR: NEGATIVE
Influenza B by PCR: NEGATIVE
SARS Coronavirus 2 by RT PCR: NEGATIVE

## 2020-02-17 LAB — HEPATIC FUNCTION PANEL
ALT: 110 U/L — ABNORMAL HIGH (ref 0–44)
AST: 134 U/L — ABNORMAL HIGH (ref 15–41)
Albumin: 2.8 g/dL — ABNORMAL LOW (ref 3.5–5.0)
Alkaline Phosphatase: 71 U/L (ref 38–126)
Bilirubin, Direct: 0.4 mg/dL — ABNORMAL HIGH (ref 0.0–0.2)
Indirect Bilirubin: 0.4 mg/dL (ref 0.3–0.9)
Total Bilirubin: 0.8 mg/dL (ref 0.3–1.2)
Total Protein: 5.7 g/dL — ABNORMAL LOW (ref 6.5–8.1)

## 2020-02-17 LAB — URINALYSIS, ROUTINE W REFLEX MICROSCOPIC
Bilirubin Urine: NEGATIVE
Glucose, UA: NEGATIVE mg/dL
Hgb urine dipstick: NEGATIVE
Ketones, ur: NEGATIVE mg/dL
Leukocytes,Ua: NEGATIVE
Nitrite: NEGATIVE
Protein, ur: NEGATIVE mg/dL
Specific Gravity, Urine: 1.018 (ref 1.005–1.030)
pH: 8 (ref 5.0–8.0)

## 2020-02-17 LAB — TROPONIN I (HIGH SENSITIVITY)
Troponin I (High Sensitivity): 14 ng/L (ref <18)
Troponin I (High Sensitivity): 14 ng/L (ref <18)

## 2020-02-17 LAB — LACTIC ACID, PLASMA
Lactic Acid, Venous: 1.6 mmol/L (ref 0.5–1.9)
Lactic Acid, Venous: 1.2 mmol/L (ref 0.5–1.9)

## 2020-02-17 LAB — PROCALCITONIN: Procalcitonin: 0.32 ng/mL

## 2020-02-17 MED ORDER — IPRATROPIUM-ALBUTEROL 0.5-2.5 (3) MG/3ML IN SOLN
3.0000 mL | Freq: Four times a day (QID) | RESPIRATORY_TRACT | Status: DC | PRN
  Administered 2020-02-19: 3 mL via RESPIRATORY_TRACT
  Filled 2020-02-17: qty 3

## 2020-02-17 MED ORDER — VANCOMYCIN HCL 1500 MG/300ML IV SOLN
1500.0000 mg | Freq: Once | INTRAVENOUS | Status: AC
  Administered 2020-02-17: 1500 mg via INTRAVENOUS
  Filled 2020-02-17: qty 300

## 2020-02-17 MED ORDER — ACETAMINOPHEN 325 MG PO TABS
650.0000 mg | ORAL_TABLET | Freq: Four times a day (QID) | ORAL | Status: DC | PRN
  Administered 2020-02-19: 650 mg via ORAL
  Filled 2020-02-17: qty 2

## 2020-02-17 MED ORDER — AMLODIPINE BESYLATE 5 MG PO TABS
10.0000 mg | ORAL_TABLET | Freq: Every day | ORAL | Status: DC
  Administered 2020-02-18 – 2020-02-19 (×2): 10 mg via ORAL
  Filled 2020-02-17 (×2): qty 2

## 2020-02-17 MED ORDER — ALBUTEROL SULFATE (2.5 MG/3ML) 0.083% IN NEBU: 2.5000 mg | INHALATION_SOLUTION | RESPIRATORY_TRACT | Status: DC | PRN

## 2020-02-17 MED ORDER — UMECLIDINIUM BROMIDE 62.5 MCG/INH IN AEPB
2.0000 | INHALATION_SPRAY | Freq: Every day | RESPIRATORY_TRACT | Status: DC
  Administered 2020-02-18 – 2020-02-19 (×2): 2 via RESPIRATORY_TRACT
  Filled 2020-02-17 (×2): qty 7

## 2020-02-17 MED ORDER — ASPIRIN 81 MG PO CHEW
81.0000 mg | CHEWABLE_TABLET | Freq: Every day | ORAL | Status: DC
  Administered 2020-02-18 – 2020-02-19 (×2): 81 mg via ORAL
  Filled 2020-02-17 (×2): qty 1

## 2020-02-17 MED ORDER — IPRATROPIUM-ALBUTEROL 0.5-2.5 (3) MG/3ML IN SOLN
3.0000 mL | Freq: Four times a day (QID) | RESPIRATORY_TRACT | Status: DC
  Administered 2020-02-17: 3 mL via RESPIRATORY_TRACT
  Filled 2020-02-17: qty 3

## 2020-02-17 MED ORDER — PREDNISONE 1 MG PO TABS
1.0000 mg | ORAL_TABLET | Freq: Two times a day (BID) | ORAL | Status: DC
  Administered 2020-02-18 (×2): 1 mg via ORAL
  Filled 2020-02-17 (×10): qty 1

## 2020-02-17 MED ORDER — HEPARIN SODIUM (PORCINE) 5000 UNIT/ML IJ SOLN
5000.0000 [IU] | Freq: Three times a day (TID) | INTRAMUSCULAR | Status: DC
  Administered 2020-02-17 – 2020-02-19 (×5): 5000 [IU] via SUBCUTANEOUS
  Filled 2020-02-17 (×5): qty 1

## 2020-02-17 MED ORDER — SODIUM CHLORIDE 0.9 % IV SOLN
2.0000 g | Freq: Once | INTRAVENOUS | Status: AC
  Administered 2020-02-17: 2 g via INTRAVENOUS
  Filled 2020-02-17: qty 2

## 2020-02-17 MED ORDER — ACETAMINOPHEN 500 MG PO TABS
1000.0000 mg | ORAL_TABLET | Freq: Once | ORAL | Status: AC
  Administered 2020-02-17: 1000 mg via ORAL
  Filled 2020-02-17: qty 2

## 2020-02-17 MED ORDER — LEVOTHYROXINE SODIUM 25 MCG PO TABS
25.0000 ug | ORAL_TABLET | Freq: Every day | ORAL | Status: DC
  Administered 2020-02-17 – 2020-02-19 (×3): 25 ug via ORAL
  Filled 2020-02-17 (×3): qty 1

## 2020-02-17 MED ORDER — SODIUM CHLORIDE 0.9 % IV SOLN
2.0000 g | Freq: Two times a day (BID) | INTRAVENOUS | Status: DC
  Administered 2020-02-18 – 2020-02-19 (×4): 2 g via INTRAVENOUS
  Filled 2020-02-17 (×4): qty 2

## 2020-02-17 MED ORDER — ALBUTEROL SULFATE (2.5 MG/3ML) 0.083% IN NEBU: 2.5000 mg | INHALATION_SOLUTION | Freq: Four times a day (QID) | RESPIRATORY_TRACT | Status: DC

## 2020-02-17 MED ORDER — VANCOMYCIN HCL IN DEXTROSE 1-5 GM/200ML-% IV SOLN: 1000.0000 mg | Freq: Once | INTRAVENOUS | Status: DC

## 2020-02-17 MED ORDER — IPRATROPIUM BROMIDE 0.02 % IN SOLN: 0.5000 mg | Freq: Four times a day (QID) | RESPIRATORY_TRACT | Status: DC

## 2020-02-17 MED ORDER — ALBUTEROL SULFATE HFA 108 (90 BASE) MCG/ACT IN AERS
2.0000 | INHALATION_SPRAY | Freq: Once | RESPIRATORY_TRACT | Status: AC
  Administered 2020-02-17: 2 via RESPIRATORY_TRACT
  Filled 2020-02-17: qty 6.7

## 2020-02-17 MED ORDER — IOHEXOL 350 MG/ML SOLN
100.0000 mL | Freq: Once | INTRAVENOUS | Status: AC | PRN
  Administered 2020-02-17: 100 mL via INTRAVENOUS

## 2020-02-17 MED ORDER — PANTOPRAZOLE SODIUM 40 MG PO TBEC
40.0000 mg | DELAYED_RELEASE_TABLET | Freq: Every day | ORAL | Status: DC
  Administered 2020-02-18 – 2020-02-19 (×2): 40 mg via ORAL
  Filled 2020-02-17 (×2): qty 1

## 2020-02-17 MED ORDER — ACETAMINOPHEN 650 MG RE SUPP: 650.0000 mg | Freq: Four times a day (QID) | RECTAL | Status: DC | PRN

## 2020-02-17 MED ORDER — VANCOMYCIN HCL 750 MG/150ML IV SOLN
750.0000 mg | Freq: Two times a day (BID) | INTRAVENOUS | Status: DC
  Administered 2020-02-18: 750 mg via INTRAVENOUS
  Filled 2020-02-17 (×5): qty 150

## 2020-02-17 NOTE — ED Notes (Signed)
Pt gone to CT

## 2020-02-17 NOTE — Progress Notes (Signed)
Pharmacy Antibiotic Note  [name] is a 74 y.o. male admitted on 02/17/2020 with pneumonia.  Pharmacy has been consulted for vancomycin and cefepime dosing.  Plan: vancomycin 1.5gm iv x1  Vancomycin 750m IV every 12 hours.  Goal trough 15-20 mcg/mL. cefepime 2gm iv q12h   Height: _0  (167.6 cm) Weight: 83.9 kg (185 lb) IBW/kg (Calculated) : 63.8  Temp (24hrs), Avg:99.5 F (37.5 C), Min:98.4 F (36.9 C), Max:101.4 F (38.6 C)  Recent Labs  Lab 02/15/20 1041 02/17/20 1200 02/17/20 1435  WBC  --  4.0  --   CREATININE 0.83 1.14  --   LATICACIDVEN  --  1.6 1.2    Estimated Creatinine Clearance: 57.7 mL/min (by C-G formula based on SCr of 1.14 mg/dL).    Allergies  Allergen Reactions  . Bactrim [Sulfamethoxazole-Trimethoprim]     Patient states "got short winded"    Antimicrobials this admission: 4/17 cefepime >>  4/17 vancomycin >>    Microbiology results: 4/17 BCx: sent 4/17 UCx: sent  4/17 Sputum: negative  4/17 MRSA PCR: ordered   Thank you for allowing pharmacy to be a part of this patient's care.  GDonna ChristenCoffee 02/17/2020 7:22 PM

## 2020-02-17 NOTE — ED Notes (Signed)
Pt's wife called to check on pt, informed her of visiting hours

## 2020-02-17 NOTE — ED Notes (Signed)
ED TO INPATIENT HANDOFF REPORT  ED Nurse Name and Phone #:  Fabio Neighbors RN [phone]  S Name/Age/Gender [name] 74 y.o. male Room/Bed: APA19/APA19  Code Status   Code Status: Not on file  Home/SNF/Other Home Patient oriented to: self, place, time and situation Is this baseline? Yes   Triage Complete: Triage complete  Chief Complaint Acute on chronic respiratory failure ([place]) [J96.20]  Triage Note Pt brought in by EMS. Pt reported feeling bad yesterday. Noted to have a congested dry cough. Pt reports coughing yesterday. Pt has had both covid vaccines. SAts upon arrival 82 %, normally wears O2 at HS  O2 initiated by EMS at 4 L via [place] with sats increase to 98%. Temp 101.4    Allergies Allergies  Allergen Reactions  . Bactrim [Sulfamethoxazole-Trimethoprim]     Patient states "got short winded"    Level of Care/Admitting Diagnosis ED Disposition    ED Disposition Condition [place]: [facility] [[id number]]  Level of Care: Med-Surg [16]  Covid Evaluation: Confirmed COVID Negative  Diagnosis: Acute on chronic respiratory failure [facility]) [[id number]]  Admitting Physician: Manfred Shirts  Attending Physician: Waldron Labs, DAWOOD S [[id number]]       B Medical/Surgery History Past Medical History:  Diagnosis Date  . Arthritis   . COPD (chronic obstructive pulmonary disease) ([place])   . Diabetes mellitus   . Hypertension   . Pneumonia   . Thyroid disease    Past Surgical History:  Procedure Laterality Date  . KNEE ARTHROSCOPY    . NO PAST SURGERIES       A IV Location/Drains/Wounds Patient Lines/Drains/Airways Status   Active Line/Drains/Airways    Name:   Placement date:   Placement time:   Site:   Days:   Peripheral IV 02/17/20 Right Antecubital   02/17/20    1146    Antecubital   less than 1          Intake/Output Last 24 hours  Intake/Output Summary (Last 24 hours) at 02/17/2020 1802 Last data filed at 02/17/2020  1757 Gross per 24 hour  Intake 800 ml  Output 825 ml  Net -25 ml    Labs/Imaging Results for orders placed or performed during the hospital encounter of 02/17/20 (from the past 48 hour(s))  Comprehensive metabolic panel     Status: Abnormal   Collection Time: 02/17/20 12:00 PM  Result Value Ref Range   Sodium 137 135 - 145 mmol/L   Potassium 4.2 3.5 - 5.1 mmol/L   Chloride 99 98 - 111 mmol/L   CO2 29 22 - 32 mmol/L   Glucose, Bld 150 (H) 70 - 99 mg/dL    Comment: Glucose reference range applies only to samples taken after fasting for at least 8 hours.   BUN 11 8 - 23 mg/dL   Creatinine, Ser 1.14 0.61 - 1.24 mg/dL   Calcium 8.0 (L) 8.9 - 10.3 mg/dL   Total Protein 6.7 6.5 - 8.1 g/dL   Albumin 3.3 (L) 3.5 - 5.0 g/dL   AST 168 (H) 15 - 41 U/L   ALT 134 (H) 0 - 44 U/L   Alkaline Phosphatase 84 38 - 126 U/L   Total Bilirubin 1.0 0.3 - 1.2 mg/dL   GFR calc non Af Amer >60 >60 mL/min   GFR calc Af Amer >60 >60 mL/min   Anion gap 9 5 - 15    Comment: Performed at [facility], [address] [place], [place]  [address]  Lactic acid, plasma     Status: None   Collection Time: 02/17/20 12:00 PM  Result Value Ref Range   Lactic Acid, Venous 1.6 0.5 - 1.9 mmol/L    Comment: Performed at [facility], [address], [place], [place] [address]  CBC with Differential     Status: Abnormal   Collection Time: 02/17/20 12:00 PM  Result Value Ref Range   WBC 4.0 4.0 - 10.5 K/uL   RBC 3.88 (L) 4.22 - 5.81 MIL/uL   Hemoglobin 12.0 (L) 13.0 - 17.0 g/dL   HCT 38.7 (L) 39.0 - 52.0 %   MCV 99.7 80.0 - 100.0 fL   MCH 30.9 26.0 - 34.0 pg   MCHC 31.0 30.0 - 36.0 g/dL   RDW 16.1 (H) 11.5 - 15.5 %   Platelets 206 150 - 400 K/uL   nRBC 0.0 0.0 - 0.2 %   Neutrophils Relative % 81 %   Neutro Abs 3.3 1.7 - 7.7 K/uL   Lymphocytes Relative 9 %   Lymphs Abs 0.4 (L) 0.7 - 4.0 K/uL   Monocytes Relative 8 %   Monocytes Absolute 0.3 0.1 - 1.0 K/uL   Eosinophils Relative 1 %   Eosinophils Absolute 0.1  0.0 - 0.5 K/uL   Basophils Relative 0 %   Basophils Absolute 0.0 0.0 - 0.1 K/uL   Immature Granulocytes 1 %   Abs Immature Granulocytes 0.02 0.00 - 0.07 K/uL    Comment: Performed at [facility], [address] [place], [place] [address]  Protime-INR     Status: None   Collection Time: 02/17/20 12:00 PM  Result Value Ref Range   Prothrombin Time 14.2 11.4 - 15.2 seconds   INR 1.1 0.8 - 1.2    Comment: (NOTE) INR goal varies based on device and disease states. Performed at [facility], [address] [place], [place] [address]   Troponin I (High Sensitivity)     Status: None   Collection Time: 02/17/20 12:00 PM  Result Value Ref Range   Troponin I (High Sensitivity) 14 <18 ng/L    Comment: (NOTE) Elevated high sensitivity troponin I (hsTnI) values and significant  changes across serial measurements may suggest ACS but many other  chronic and acute conditions are known to elevate hsTnI results.  Refer to the "Links" section for chest pain algorithms and additional  guidance. Performed at [facility], [address], [place], [place] [address]   Culture, blood (Routine x 2)     Status: None (Preliminary result)   Collection Time: 02/17/20 12:12 PM   Specimen: Left Antecubital; Blood  Result Value Ref Range   Specimen Description      LEFT ANTECUBITAL BOTTLES DRAWN AEROBIC AND ANAEROBIC   Special Requests      Blood Culture adequate volume Performed at [facility], [address], [place], [place] [address]    Culture PENDING    Report Status PENDING   Culture, blood (Routine x 2)     Status: None (Preliminary result)   Collection Time: 02/17/20 12:13 PM   Specimen: BLOOD LEFT ARM  Result Value Ref Range   Specimen Description BLOOD LEFT ARM BOTTLES DRAWN AEROBIC AND ANAEROBIC    Special Requests      Blood Culture adequate volume Performed at [facility], [address] [place], [place] [address]    Culture PENDING    Report Status PENDING   Respiratory Panel by RT  PCR (Flu A&B, Covid) - Nasopharyngeal Swab  Status: None   Collection Time: 02/17/20 12:30 PM   Specimen: Nasopharyngeal Swab  Result Value Ref Range   SARS Coronavirus 2 by RT PCR NEGATIVE NEGATIVE    Comment: (NOTE) SARS-CoV-2 target nucleic acids are NOT DETECTED. The SARS-CoV-2 RNA is generally detectable in upper respiratoy specimens during the acute phase of infection. The lowest concentration of SARS-CoV-2 viral copies this assay can detect is 131 copies/mL. A negative result does not preclude SARS-Cov-2 infection and should not be used as the sole basis for treatment or other patient management decisions. A negative result may occur with  improper specimen collection/handling, submission of specimen other than nasopharyngeal swab, presence of viral mutation(s) within the areas targeted by this assay, and inadequate number of viral copies (<131 copies/mL). A negative result must be combined with clinical observations, patient history, and epidemiological information. The expected result is Negative. Fact Sheet for Patients:  PinkCheek.be Fact Sheet for Healthcare Providers:  GravelBags.it This test is not yet ap proved or cleared by the [place] FDA and  has been authorized for detection and/or diagnosis of SARS-CoV-2 by FDA under an Emergency Use Authorization (EUA). This EUA will remain  in effect (meaning this test can be used) for the duration of the COVID-19 declaration under Section 564(b)(1) of the Act, 21 U.S.C. section 360bbb-3(b)(1), unless the authorization is terminated or revoked sooner.    Influenza A by PCR NEGATIVE NEGATIVE   Influenza B by PCR NEGATIVE NEGATIVE    Comment: (NOTE) The Xpert Xpress SARS-CoV-2/FLU/RSV assay is intended as an aid in  the diagnosis of influenza from Nasopharyngeal swab specimens and  should not be used as a sole basis for treatment. Nasal washings and  aspirates  are unacceptable for Xpert Xpress SARS-CoV-2/FLU/RSV  testing. Fact Sheet for Patients: PinkCheek.be Fact Sheet for Healthcare Providers: GravelBags.it This test is not yet approved or cleared by the [place] FDA and  has been authorized for detection and/or diagnosis of SARS-CoV-2 by  FDA under an Emergency Use Authorization (EUA). This EUA will remain  in effect (meaning this test can be used) for the duration of the  Covid-19 declaration under Section 564(b)(1) of the Act, 21  U.S.C. section 360bbb-3(b)(1), unless the authorization is  terminated or revoked. Performed at [facility], [address] [place], [place] [address]   Lactic acid, plasma     Status: None   Collection Time: 02/17/20  2:35 PM  Result Value Ref Range   Lactic Acid, Venous 1.2 0.5 - 1.9 mmol/L    Comment: Performed at [facility], [address] [place], [place] [address]  Troponin I (High Sensitivity)     Status: None   Collection Time: 02/17/20  2:35 PM  Result Value Ref Range   Troponin I (High Sensitivity) 14 <18 ng/L    Comment: (NOTE) Elevated high sensitivity troponin I (hsTnI) values and significant  changes across serial measurements may suggest ACS but many other  chronic and acute conditions are known to elevate hsTnI results.  Refer to the "Links" section for chest pain algorithms and additional  guidance. Performed at [facility], [address] [place], [place] [address]   Urinalysis, Routine w reflex microscopic     Status: None   Collection Time: 02/17/20  3:07 PM  Result Value Ref Range   Color, Urine YELLOW YELLOW   APPearance CLEAR CLEAR   Specific Gravity, Urine 1.018 1.005 - 1.030   pH 8.0 5.0 - 8.0   Glucose, UA NEGATIVE NEGATIVE mg/dL  Hgb urine dipstick NEGATIVE NEGATIVE   Bilirubin Urine NEGATIVE NEGATIVE   Ketones, ur NEGATIVE NEGATIVE mg/dL   Protein, ur NEGATIVE NEGATIVE mg/dL   Nitrite NEGATIVE NEGATIVE    Leukocytes,Ua NEGATIVE NEGATIVE    Comment: Performed at [facility], [address], [place], [place] [address]   CT Angio Chest PE W and/or Wo Contrast  Result Date: 02/17/2020 CLINICAL DATA:  Cough. Decreased O2 sats. Pulmonary embolism suspected. Abdominal abscess/infection suspected. EXAM: CT ANGIOGRAPHY CHEST CT ABDOMEN AND PELVIS WITH CONTRAST TECHNIQUE: Multidetector CT imaging of the chest was performed using the standard protocol during bolus administration of intravenous contrast. Multiplanar CT image reconstructions and MIPs were obtained to evaluate the vascular anatomy. Multidetector CT imaging of the abdomen and pelvis was performed using the standard protocol during bolus administration of intravenous contrast. CONTRAST:  120m OMNIPAQUE IOHEXOL 350 MG/ML SOLN COMPARISON:  Chest CT dated 05/26/2019. FINDINGS: CTA CHEST FINDINGS Cardiovascular: No convincing pulmonary embolism identified within the main, lobar or segmental pulmonary arteries bilaterally. Flow artifact is present at the junction of the RIGHT lower lobe pulmonary artery branches. There is diminution of contrast passive occasion within the more peripheral aspects of the RIGHT lower lobe suggesting vascular shunting. No thoracic aortic aneurysm or evidence of aortic dissection. No pericardial effusion. Mediastinum/Nodes: Hypertrophy of the RIGHT thyroid lobe, chronic. No mass or enlarged lymph nodes are seen within the mediastinum or perihilar regions. Esophagus is unremarkable. Trachea and central bronchi are unremarkable. Lungs/Pleura: Advanced interstitial fibrosis bilaterally, RIGHT significantly more advanced than the LEFT. No consolidation or pleural effusion. No pneumothorax. Musculoskeletal: No acute or suspicious osseous finding. Review of the MIP images confirms the above findings. CT ABDOMEN and PELVIS FINDINGS Hepatobiliary: No focal liver abnormality is seen. Small gallstone within the fundus of the gallbladder.  Gallbladder appears otherwise normal. No bile duct dilatation seen. Pancreas: Partially infiltrated with fat but otherwise unremarkable. Spleen: Normal in size without focal abnormality. Adrenals/Urinary Tract: Adrenal glands appear normal. Bilateral renal cysts. Kidneys are otherwise unremarkable without suspicious mass, stone or hydronephrosis. No ureteral or bladder calculi identified. Bladder appears normal. Stomach/Bowel: No dilated large or small bowel loops. No evidence of bowel wall inflammation. Scattered diverticulosis but no focal inflammatory change to suggest acute diverticulitis. Appendix is normal. Stomach is unremarkable, partially decompressed. Vascular/Lymphatic: No significant vascular findings are present. No enlarged abdominal or pelvic lymph nodes. Reproductive: Prostate is unremarkable. Other: Bilateral inguinal hernias which contain fat only. No free fluid or abscess collection within the abdomen or pelvis. No free intraperitoneal air. Musculoskeletal: No acute or suspicious osseous finding. Degenerative spondylosis of the lumbar spine, mild to moderate in degree. Review of the MIP images confirms the above findings. IMPRESSION: 1. Advanced interstitial fibrosis lung disease bilaterally, RIGHT significantly more advanced than the LEFT. No consolidation or pleural effusion. 2. No acute findings within the chest. No pulmonary embolism. No pneumonia or pleural effusion. 3. No acute findings within the abdomen or pelvis. No bowel obstruction or evidence of bowel wall inflammation. No evidence of acute solid organ abnormality. No free fluid or abscess collection. No renal or ureteral calculi. Appendix is normal. 4. Colonic diverticulosis without evidence of acute diverticulitis. Electronically Signed   By: SFranki CabotM.D.   On: 02/17/2020 14:57   CT ABDOMEN PELVIS W CONTRAST  Result Date: 02/17/2020 CLINICAL DATA:  Cough. Decreased O2 sats. Pulmonary embolism suspected. Abdominal  abscess/infection suspected. EXAM: CT ANGIOGRAPHY CHEST CT ABDOMEN AND PELVIS WITH CONTRAST TECHNIQUE: Multidetector CT imaging of the chest was performed  using the standard protocol during bolus administration of intravenous contrast. Multiplanar CT image reconstructions and MIPs were obtained to evaluate the vascular anatomy. Multidetector CT imaging of the abdomen and pelvis was performed using the standard protocol during bolus administration of intravenous contrast. CONTRAST:  150m OMNIPAQUE IOHEXOL 350 MG/ML SOLN COMPARISON:  Chest CT dated 05/26/2019. FINDINGS: CTA CHEST FINDINGS Cardiovascular: No convincing pulmonary embolism identified within the main, lobar or segmental pulmonary arteries bilaterally. Flow artifact is present at the junction of the RIGHT lower lobe pulmonary artery branches. There is diminution of contrast passive occasion within the more peripheral aspects of the RIGHT lower lobe suggesting vascular shunting. No thoracic aortic aneurysm or evidence of aortic dissection. No pericardial effusion. Mediastinum/Nodes: Hypertrophy of the RIGHT thyroid lobe, chronic. No mass or enlarged lymph nodes are seen within the mediastinum or perihilar regions. Esophagus is unremarkable. Trachea and central bronchi are unremarkable. Lungs/Pleura: Advanced interstitial fibrosis bilaterally, RIGHT significantly more advanced than the LEFT. No consolidation or pleural effusion. No pneumothorax. Musculoskeletal: No acute or suspicious osseous finding. Review of the MIP images confirms the above findings. CT ABDOMEN and PELVIS FINDINGS Hepatobiliary: No focal liver abnormality is seen. Small gallstone within the fundus of the gallbladder. Gallbladder appears otherwise normal. No bile duct dilatation seen. Pancreas: Partially infiltrated with fat but otherwise unremarkable. Spleen: Normal in size without focal abnormality. Adrenals/Urinary Tract: Adrenal glands appear normal. Bilateral renal cysts. Kidneys  are otherwise unremarkable without suspicious mass, stone or hydronephrosis. No ureteral or bladder calculi identified. Bladder appears normal. Stomach/Bowel: No dilated large or small bowel loops. No evidence of bowel wall inflammation. Scattered diverticulosis but no focal inflammatory change to suggest acute diverticulitis. Appendix is normal. Stomach is unremarkable, partially decompressed. Vascular/Lymphatic: No significant vascular findings are present. No enlarged abdominal or pelvic lymph nodes. Reproductive: Prostate is unremarkable. Other: Bilateral inguinal hernias which contain fat only. No free fluid or abscess collection within the abdomen or pelvis. No free intraperitoneal air. Musculoskeletal: No acute or suspicious osseous finding. Degenerative spondylosis of the lumbar spine, mild to moderate in degree. Review of the MIP images confirms the above findings. IMPRESSION: 1. Advanced interstitial fibrosis lung disease bilaterally, RIGHT significantly more advanced than the LEFT. No consolidation or pleural effusion. 2. No acute findings within the chest. No pulmonary embolism. No pneumonia or pleural effusion. 3. No acute findings within the abdomen or pelvis. No bowel obstruction or evidence of bowel wall inflammation. No evidence of acute solid organ abnormality. No free fluid or abscess collection. No renal or ureteral calculi. Appendix is normal. 4. Colonic diverticulosis without evidence of acute diverticulitis. Electronically Signed   By: SFranki CabotM.D.   On: 02/17/2020 14:57   DG Chest Portable 1 View  Result Date: 02/17/2020 CLINICAL DATA:  Dyspnea, cough, fever EXAM: PORTABLE CHEST 1 VIEW COMPARISON:  05/26/2019 chest radiograph. FINDINGS: Stable cardiomediastinal silhouette with normal heart size. No pneumothorax. No pleural effusion. Extensive coarse reticular opacities throughout the right greater than left lungs, similar. No acute superimposed consolidative airspace disease.  IMPRESSION: Chronic advanced fibrotic interstitial lung disease involving the right greater than left lungs, with no acute superimposed cardiopulmonary disease. Electronically Signed   By: [name]   On: 02/17/2020 12:35    Pending Labs [employer](From admission, onward)    Start     Ordered   Signed and Held  CBC  (heparin)  Once,   R    Comments: Baseline for heparin therapy IF NOT ALREADY DRAWN.  Notify MD  if PLT < 100 K.    Signed and Held   Signed and Held  Creatinine, serum  (heparin)  Once,   R    Comments: Baseline for heparin therapy IF NOT ALREADY DRAWN.    Signed and Held   Signed and Held  Comprehensive metabolic panel  Tomorrow morning,   R     Signed and Held   Signed and Held  CBC  Tomorrow morning,   R     Signed and Held          Vitals/Pain Today's Vitals   02/17/20 1530 02/17/20 1600 02/17/20 1700 02/17/20 1723  BP: (!) 124/54 (!) 115/58 123/63   Pulse: 79 80 79   Resp: (!) 23 (!) 23 (!) 23   Temp:      TempSrc:      SpO2: 100% 94% 93%   Weight:      Height:      PainSc:    0-No pain    Isolation Precautions No active isolations  Medications Medications  vancomycin (VANCOREADY) IVPB 750 mg/150 mL (has no administration in time range)  ceFEPIme (MAXIPIME) 2 g in sodium chloride 0.9 % 100 mL IVPB (has no administration in time range)  albuterol (VENTOLIN HFA) 108 (90 Base) MCG/ACT inhaler 2 puff (2 puffs Inhalation Given 02/17/20 1226)  acetaminophen (TYLENOL) tablet 1,000 mg (1,000 mg Oral Given 02/17/20 1226)  ceFEPIme (MAXIPIME) 2 g in sodium chloride 0.9 % 100 mL IVPB (0 g Intravenous Stopped 02/17/20 1510)  vancomycin (VANCOREADY) IVPB 1500 mg/300 mL (0 mg Intravenous Stopped 02/17/20 1757)  iohexol (OMNIPAQUE) 350 MG/ML injection 100 mL (100 mLs Intravenous Contrast Given 02/17/20 1414)    Mobility walks Low fall risk   Focused Assessments    R Recommendations: See Admitting Provider Note  Report given to:  Lilia Pro RN on  300  Additional Notes:

## 2020-02-17 NOTE — ED Triage Notes (Signed)
Pt brought in by EMS. Pt reported feeling bad yesterday. Noted to have a congested dry cough. Pt reports coughing yesterday. Pt has had both covid vaccines. SAts upon arrival 82 %, normally wears O2 at HS  O2 initiated by EMS at 4 L via [place] with sats increase to 98%. Temp 101.4

## 2020-02-17 NOTE — H&P (Addendum)
TRH H&P   Patient Demographics:    [name], is a 74 y.o. male  MRN: [id number]   DOB - 11/26/1945  Admit Date - 02/17/2020  Outpatient Primary MD for the patient is Merrilee Seashore, MD  Referring MD/NP/PA: Dr long  Outpatient Specialists: Pulmonary  Dr. Chase Caller  Patient coming from: Home.  Chief Complaint  Patient presents with  . Fever      HPI:    [name]  is a 74 y.o. male, has medical history of hypertension, diabetes mellitus, pulmonary fibrosis, chronic respiratory failure on 4 L nasal cannula at bedtime, COPD, he presents to ED secondary for complaints of feeling bad, when he asked to specify, reports fatigue, patient reports dyspnea at baseline, cough at baseline, he reports feeling feverish here in ED, he denies any fever at home, he denies any dysuria, polyuria, chest pain, abdominal pain, no nausea, vomiting or diarrhea, reports he received his second vaccine almost 10 days ago for COVID-19. - in ED he was febrile 101.2, with elevated LFTs, CTA chest negative for PE, as well no evidence of acute infectious process in the lung, his UA has been negative as well, given his immunocompromise with azathioprine and prednisone for rheumatoid arthritis and pulmonary fibrosis, he was started on broad-spectrum antibiotic and I was called to admit.    Review of systems:    In addition to the HPI above,  No Fever-chills, but is feeling fatigue, and feeling bad, patient was febrile in ED. No Headache, No changes with Vision or hearing, No problems swallowing food or Liquids, No Chest pain, Cough, reports his dyspnea at baseline no Abdominal pain, No Nausea or Vommitting, Bowel movements are regular, No Blood in stool or Urine, No dysuria, No new skin rashes or bruises, No new joints pains-aches,  No new weakness, tingling, numbness in any extremity, No recent  weight gain or loss, No polyuria, polydypsia or polyphagia, No significant Mental Stressors.  A full 10 point Review of Systems was done, except as stated above, all other Review of Systems were negative.   With Past History of the following :    Past Medical History:  Diagnosis Date  . Arthritis   . COPD (chronic obstructive pulmonary disease) ([place])   . Diabetes mellitus   . Hypertension   . Pneumonia   . Thyroid disease       Past Surgical History:  Procedure Laterality Date  . KNEE ARTHROSCOPY    . NO PAST SURGERIES        Social History:     Social History   Tobacco Use  . Smoking status: Former Smoker    Packs/day: 0.50    Years: 10.00    Pack years: 5.00    Types: Cigarettes    Quit date: 12/03/1984    Years since quitting: 35.2  . Smokeless tobacco: Never Used  . Tobacco comment: Quit  40 years ago.   Substance Use Topics  . Alcohol use: No       Family History :     Family History  Problem Relation Age of Onset  . Diabetes Mother   . Stroke Mother      Home Medications:   Prior to Admission medications   Medication Sig Start Date End Date Taking? Authorizing Provider  amLODipine (NORVASC) 10 MG tablet Take 10 mg by mouth daily.   Yes [provider]  aspirin 81 MG tablet Take 81 mg by mouth daily.   Yes [provider]  atorvastatin (LIPITOR) 20 MG tablet Take 20 mg by mouth daily. 08/19/18  Yes [provider]  azaTHIOprine (IMURAN) 50 MG tablet Take 100 mg by mouth daily. Take two tablets by mouth in the morning 04/13/19  Yes [provider]  levothyroxine (SYNTHROID, LEVOTHROID) 25 MCG tablet Take 25 mcg by mouth daily.   Yes [provider]  metFORMIN (GLUCOPHAGE-XR) 500 MG 24 hr tablet Take 2 tablets by mouth 2 (two) times daily.  11/02/13  Yes [provider]  Nintedanib (OFEV) 150 MG CAPS Take 1 capsule (150 mg total) by mouth 2 (two) times daily. 01/15/20  Yes Brand Males, MD    pantoprazole (PROTONIX) 40 MG tablet Take 40 mg by mouth daily. 01/14/20  Yes [provider]  predniSONE (DELTASONE) 1 MG tablet Take 1 mg by mouth in the morning, at noon, and at bedtime.    Yes [provider]  Tiotropium Bromide Monohydrate (SPIRIVA RESPIMAT) 2.5 MCG/ACT AERS Inhale 2 puffs into the lungs daily. 10/16/19  Yes Lauraine Rinne, NP     Allergies:     Allergies  Allergen Reactions  . Bactrim [Sulfamethoxazole-Trimethoprim]     Patient states "got short winded"     Physical Exam:   Vitals  Blood pressure (!) 115/58, pulse 80, temperature 98.7 F (37.1 C), temperature source Oral, resp. rate (!) 23, height _0  (1.676 m), weight 83.9 kg, SpO2 94 %.   1. General well developed male, laying in bed, in no apparent distress  2. Normal affect and insight, Not Suicidal or Homicidal, Awake Alert, Oriented X 3.  3. No F.N deficits, ALL C.Nerves Intact, Strength 5/5 all 4 extremities, Sensation intact all 4 extremities, Plantars down going.  4. Ears and Eyes appear Normal, Conjunctivae clear, PERRLA. Moist Oral Mucosa.  5. Supple Neck, No JVD, No cervical lymphadenopathy appriciated, No Carotid Bruits.  6. Symmetrical Chest wall movement, Good air movement bilaterally, CTAB.  7. RRR, No Gallops, Rubs or Murmurs, No Parasternal Heave.  8. Positive Bowel Sounds, Abdomen Soft, No tenderness, No organomegaly appriciated,No rebound -guarding or rigidity.  9.  No Cyanosis, Normal Skin Turgor, No Skin Rash or Bruise.  10. Good muscle tone,  joints appear normal , no effusions, Normal ROM.  11. No Palpable Lymph Nodes in Neck or Axillae     Data Review:    CBC Recent Labs  Lab 02/17/20 1200  WBC 4.0  HGB 12.0*  HCT 38.7*  PLT 206  MCV 99.7  MCH 30.9  MCHC 31.0  RDW 16.1*  LYMPHSABS 0.4*  MONOABS 0.3  EOSABS 0.1  BASOSABS 0.0    ------------------------------------------------------------------------------------------------------------------  Chemistries  Recent Labs  Lab 02/15/20 1041 02/17/20 1200  NA 138 137  K 3.9 4.2  CL 101 99  CO2 29 29  GLUCOSE 166* 150*  BUN 12 11  CREATININE 0.83 1.14  CALCIUM 8.8 8.0*  AST 37  37 168*  ALT 32  32 134*  ALKPHOS 59  59 84  BILITOT 0.4  0.4 1.0   ------------------------------------------------------------------------------------------------------------------ estimated creatinine clearance is 57.7 mL/min (by C-G formula based on SCr of 1.14 mg/dL). ------------------------------------------------------------------------------------------------------------------ No results for input(s): TSH, T4TOTAL, T3FREE, THYROIDAB in the last 72 hours.  Invalid input(s): FREET3  Coagulation profile Recent Labs  Lab 02/17/20 1200  INR 1.1   ------------------------------------------------------------------------------------------------------------------- No results for input(s): DDIMER in the last 72 hours. -------------------------------------------------------------------------------------------------------------------  Cardiac Enzymes No results for input(s): CKMB, TROPONINI, MYOGLOBIN in the last 168 hours.  Invalid input(s): CK ------------------------------------------------------------------------------------------------------------------    Component Value Date/Time   BNP 43.6 05/26/2019 1226     ---------------------------------------------------------------------------------------------------------------  Urinalysis    Component Value Date/Time   COLORURINE YELLOW 02/17/2020 1507   APPEARANCEUR CLEAR 02/17/2020 1507   LABSPEC 1.018 02/17/2020 1507   PHURINE 8.0 02/17/2020 1507   GLUCOSEU NEGATIVE 02/17/2020 1507   HGBUR NEGATIVE 02/17/2020 1507   BILIRUBINUR NEGATIVE 02/17/2020 [place] 02/17/2020 1507   PROTEINUR  NEGATIVE 02/17/2020 1507   NITRITE NEGATIVE 02/17/2020 1507   LEUKOCYTESUR NEGATIVE 02/17/2020 1507    ----------------------------------------------------------------------------------------------------------------   Imaging Results:    CT Angio Chest PE W and/or Wo Contrast  Result Date: 02/17/2020 CLINICAL DATA:  Cough. Decreased O2 sats. Pulmonary embolism suspected. Abdominal abscess/infection suspected. EXAM: CT ANGIOGRAPHY CHEST CT ABDOMEN AND PELVIS WITH CONTRAST TECHNIQUE: Multidetector CT imaging of the chest was performed using the standard protocol during bolus administration of intravenous contrast. Multiplanar CT image reconstructions and MIPs were obtained to evaluate the vascular anatomy. Multidetector CT imaging of the abdomen and pelvis was performed using the standard protocol during bolus administration of intravenous contrast. CONTRAST:  137m OMNIPAQUE IOHEXOL 350 MG/ML SOLN COMPARISON:  Chest CT dated 05/26/2019. FINDINGS: CTA CHEST FINDINGS Cardiovascular: No convincing pulmonary embolism identified within the main, lobar or segmental pulmonary arteries bilaterally. Flow artifact is present at the junction of the RIGHT lower lobe pulmonary artery branches. There is diminution of contrast passive occasion within the more peripheral aspects of the RIGHT lower lobe suggesting vascular shunting. No thoracic aortic aneurysm or evidence of aortic dissection. No pericardial effusion. Mediastinum/Nodes: Hypertrophy of the RIGHT thyroid lobe, chronic. No mass or enlarged lymph nodes are seen within the mediastinum or perihilar regions. Esophagus is unremarkable. Trachea and central bronchi are unremarkable. Lungs/Pleura: Advanced interstitial fibrosis bilaterally, RIGHT significantly more advanced than the LEFT. No consolidation or pleural effusion. No pneumothorax. Musculoskeletal: No acute or suspicious osseous finding. Review of the MIP images confirms the above findings. CT ABDOMEN and  PELVIS FINDINGS Hepatobiliary: No focal liver abnormality is seen. Small gallstone within the fundus of the gallbladder. Gallbladder appears otherwise normal. No bile duct dilatation seen. Pancreas: Partially infiltrated with fat but otherwise unremarkable. Spleen: Normal in size without focal abnormality. Adrenals/Urinary Tract: Adrenal glands appear normal. Bilateral renal cysts. Kidneys are otherwise unremarkable without suspicious mass, stone or hydronephrosis. No ureteral or bladder calculi identified. Bladder appears normal. Stomach/Bowel: No dilated large or small bowel loops. No evidence of bowel wall inflammation. Scattered diverticulosis but no focal inflammatory change to suggest acute diverticulitis. Appendix is normal. Stomach is unremarkable, partially decompressed. Vascular/Lymphatic: No significant vascular findings are present. No enlarged abdominal or pelvic lymph nodes. Reproductive: Prostate is unremarkable. Other: Bilateral inguinal hernias which contain fat only. No free fluid or abscess collection within the abdomen or pelvis. No free intraperitoneal air. Musculoskeletal: No acute or suspicious osseous finding. Degenerative spondylosis of the lumbar spine, mild to  moderate in degree. Review of the MIP images confirms the above findings. IMPRESSION: 1. Advanced interstitial fibrosis lung disease bilaterally, RIGHT significantly more advanced than the LEFT. No consolidation or pleural effusion. 2. No acute findings within the chest. No pulmonary embolism. No pneumonia or pleural effusion. 3. No acute findings within the abdomen or pelvis. No bowel obstruction or evidence of bowel wall inflammation. No evidence of acute solid organ abnormality. No free fluid or abscess collection. No renal or ureteral calculi. Appendix is normal. 4. Colonic diverticulosis without evidence of acute diverticulitis. Electronically Signed   By: Franki Cabot [name]   On: 02/17/2020 14:57   CT ABDOMEN PELVIS W  CONTRAST  Result Date: 02/17/2020 CLINICAL DATA:  Cough. Decreased O2 sats. Pulmonary embolism suspected. Abdominal abscess/infection suspected. EXAM: CT ANGIOGRAPHY CHEST CT ABDOMEN AND PELVIS WITH CONTRAST TECHNIQUE: Multidetector CT imaging of the chest was performed using the standard protocol during bolus administration of intravenous contrast. Multiplanar CT image reconstructions and MIPs were obtained to evaluate the vascular anatomy. Multidetector CT imaging of the abdomen and pelvis was performed using the standard protocol during bolus administration of intravenous contrast. CONTRAST:  114m OMNIPAQUE IOHEXOL 350 MG/ML SOLN COMPARISON:  Chest CT dated 05/26/2019. FINDINGS: CTA CHEST FINDINGS Cardiovascular: No convincing pulmonary embolism identified within the main, lobar or segmental pulmonary arteries bilaterally. Flow artifact is present at the junction of the RIGHT lower lobe pulmonary artery branches. There is diminution of contrast passive occasion within the more peripheral aspects of the RIGHT lower lobe suggesting vascular shunting. No thoracic aortic aneurysm or evidence of aortic dissection. No pericardial effusion. Mediastinum/Nodes: Hypertrophy of the RIGHT thyroid lobe, chronic. No mass or enlarged lymph nodes are seen within the mediastinum or perihilar regions. Esophagus is unremarkable. Trachea and central bronchi are unremarkable. Lungs/Pleura: Advanced interstitial fibrosis bilaterally, RIGHT significantly more advanced than the LEFT. No consolidation or pleural effusion. No pneumothorax. Musculoskeletal: No acute or suspicious osseous finding. Review of the MIP images confirms the above findings. CT ABDOMEN and PELVIS FINDINGS Hepatobiliary: No focal liver abnormality is seen. Small gallstone within the fundus of the gallbladder. Gallbladder appears otherwise normal. No bile duct dilatation seen. Pancreas: Partially infiltrated with fat but otherwise unremarkable. Spleen: Normal in  size without focal abnormality. Adrenals/Urinary Tract: Adrenal glands appear normal. Bilateral renal cysts. Kidneys are otherwise unremarkable without suspicious mass, stone or hydronephrosis. No ureteral or bladder calculi identified. Bladder appears normal. Stomach/Bowel: No dilated large or small bowel loops. No evidence of bowel wall inflammation. Scattered diverticulosis but no focal inflammatory change to suggest acute diverticulitis. Appendix is normal. Stomach is unremarkable, partially decompressed. Vascular/Lymphatic: No significant vascular findings are present. No enlarged abdominal or pelvic lymph nodes. Reproductive: Prostate is unremarkable. Other: Bilateral inguinal hernias which contain fat only. No free fluid or abscess collection within the abdomen or pelvis. No free intraperitoneal air. Musculoskeletal: No acute or suspicious osseous finding. Degenerative spondylosis of the lumbar spine, mild to moderate in degree. Review of the MIP images confirms the above findings. IMPRESSION: 1. Advanced interstitial fibrosis lung disease bilaterally, RIGHT significantly more advanced than the LEFT. No consolidation or pleural effusion. 2. No acute findings within the chest. No pulmonary embolism. No pneumonia or pleural effusion. 3. No acute findings within the abdomen or pelvis. No bowel obstruction or evidence of bowel wall inflammation. No evidence of acute solid organ abnormality. No free fluid or abscess collection. No renal or ureteral calculi. Appendix is normal. 4. Colonic diverticulosis without evidence of acute diverticulitis. Electronically Signed  By: Franki Cabot [name]   On: 02/17/2020 14:57   DG Chest Portable 1 View  Result Date: 02/17/2020 CLINICAL DATA:  Dyspnea, cough, fever EXAM: PORTABLE CHEST 1 VIEW COMPARISON:  05/26/2019 chest radiograph. FINDINGS: Stable cardiomediastinal silhouette with normal heart size. No pneumothorax. No pleural effusion. Extensive coarse reticular  opacities throughout the right greater than left lungs, similar. No acute superimposed consolidative airspace disease. IMPRESSION: Chronic advanced fibrotic interstitial lung disease involving the right greater than left lungs, with no acute superimposed cardiopulmonary disease. Electronically Signed   By: Ilona Sorrel [name]   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 -Given his immunocompromise will continue with broad-spectrum antibiotic coverage. -Follow on blood cultures. -Check Legionella, strep pneumonia as well. -Currently he is not hypoxic no evidence of PCP on his imaging. - will trend procalcitonin -As second vaccine chart was more than 7 days ago per his wife, so it is unlikely contributing to his fever.  Acute on chronic hypoxic respiratory failure -Patient on 4 L oxygen at bedtime, upon presentation to ED he was 82% requiring 3 L nasal cannula, up in my exam I have stopped his oxygen, so far his saturating remain more than 95%, I did check on him multiple times, so far remained stable on room air.  Rheumatoid arthritis -Hold azathioprine for now, continue with prednisone.  IPF -Discussed with Dr. Chase Caller, patient currently denies any dyspnea, there is no evidence of hypoxia, this is unlikely exacerbation. -We will need to hold OFEV especially with elevated LFTs, this is not need to be held on discharge as well, and to follow with Dr. Chase Caller as an outpatient regarding further recommendation when to  resume.  Elevated LFTs -Most likely due to SIRS, abdomen exam is benign, will continue to trend.  Hypothyroidism -Continue with Synthroid  Diabetes mellitus -Hold Metformin and will keep on insulin sliding scale  GERD -Continue with PPI.  Hyperlipidemia -We will hold statin especially with elevated LFTs.  Hypertension -Continue with Norvasc    DVT Prophylaxis Heparin   AM Labs Ordered, also please review Full Orders  Family Communication: Admission, patients condition and plan of care including tests being ordered have been discussed with the patient and  who indicate understanding and agree with the plan and Code Status.  Code Status Full  Likely DC to Home  Condition GUARDED   Consults called: none  Admission status: inpatient  Time spent in minutes : 60 minutes   Phillips Climes M.D on 02/17/2020 at 5:43 PM  Between 7am to 7pm - Pager - [phone]. After 7pm go to [email or web address] - password [facility]  [facility] - Office  [phone]

## 2020-02-17 NOTE — ED Provider Notes (Signed)
Emergency Department Provider Note   I have reviewed the triage vital signs and the nursing notes.   HISTORY  Chief Complaint Fever   HPI [name] is a 74 y.o. male with PMH of HTN, DM, Pulmonary fibrosis, and COPD requiring 3L of nighttime O2 at home presents to the emergency department for evaluation of fatigue with cough and shortness of breath along with fever.  Symptoms began last night and have progressively worsened throughout the day today.  He denies chest pain or abdominal pain.  Is not experiencing vomiting or diarrhea.  Denies dysuria, hesitancy, urgency.  He has needed to be on oxygen during the day today which is unusual.  He has been vaccinated for COVID-19 and denies any known sick contacts.  No radiation of symptoms or other modifying factors. No hemoptysis.   Past Medical History:  Diagnosis Date  . Arthritis   . COPD (chronic obstructive pulmonary disease) ([place])   . Diabetes mellitus   . Hypertension   . Pneumonia   . Thyroid disease     Patient Active Problem List   Diagnosis Date Noted  . Aortic atherosclerosis ([place]) 08/01/2019  . Shortness of breath 08/01/2019  . Therapeutic drug monitoring 05/30/2019  . Chronic respiratory failure with hypoxia ([place]) 05/30/2019  . Nocturnal hypoxemia 04/26/2019  . Medication management 04/26/2019  . Drug reaction 10/18/2018  . Vomiting without nausea 10/18/2018  . Wheezing on auscultation 01/01/2017  . Nodule of right lung 07/29/2015  . Coronary artery calcification 02/11/2014  . Caries 01/18/2014  . Chronic cough 07/01/2013  . Obesity (BMI 30.0-34.9) 07/26/2012  . 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 Pneumonia 12/15/2011  . HTN (hypertension) 12/15/2011  . Rheumatoid arthritis ([place]) 12/15/2011  . Hypothyroidism 12/15/2011    Past Surgical History:  Procedure Laterality Date  . KNEE ARTHROSCOPY    . NO PAST SURGERIES      Allergies Bactrim  [sulfamethoxazole-trimethoprim]  Family History  Problem Relation Age of Onset  . Diabetes Mother   . Stroke Mother     Social History Social History   Tobacco Use  . Smoking status: Former Smoker    Packs/day: 0.50    Years: 10.00    Pack years: 5.00    Types: Cigarettes    Quit date: 12/03/1984    Years since quitting: 35.2  . Smokeless tobacco: Never Used  . Tobacco comment: Quit 40 years ago.   Substance Use Topics  . Alcohol use: No  . Drug use: No    Review of Systems  Constitutional: Positive fever/chills and fatigue.  Eyes: No visual changes. ENT: No sore throat. Cardiovascular: Denies chest pain. Respiratory: Positive shortness of breath and cough.  Gastrointestinal: No abdominal pain.  No nausea, no vomiting.  No diarrhea.  No constipation. Genitourinary: Negative for dysuria. Musculoskeletal: Negative for back pain. Skin: Negative for rash. Neurological: Negative for headaches, focal weakness or numbness.  10-point ROS otherwise negative.  ____________________________________________   PHYSICAL EXAM:  VITAL SIGNS: ED Triage Vitals  Enc Vitals Group     BP 02/17/20 1140 127/72     Pulse Rate 02/17/20 1141 94     Resp 02/17/20 1141 (!) 31     Temp 02/17/20 1141 (!) 101.4 F (38.6 C)     Temp Source 02/17/20 1141 Oral     SpO2 02/17/20 1148 (!) 89 %     Weight 02/17/20 1141 185 lb (83.9 kg)     Height 02/17/20 1141  _0  (1.676 m)   Constitutional: Alert and oriented. Well appearing and in no acute distress. Eyes: Conjunctivae are normal.  Head: Atraumatic. Nose: No congestion/rhinnorhea. Mouth/Throat: Mucous membranes are moist.  Neck: No stridor.   Cardiovascular: Normal rate, regular rhythm. Good peripheral circulation. Grossly normal heart sounds.   Respiratory: Slight increased respiratory effort but speaking in complete sentences.  No retractions. Lungs with rhonchi bilaterally. Symmetrical exam.  Gastrointestinal: Soft and nontender. No  distention.  Musculoskeletal: No gross deformities of extremities. Neurologic:  Normal speech and language.  Skin:  Skin is warm, dry and intact. No rash noted.  ____________________________________________   LABS (all labs ordered are listed, but only abnormal results are displayed)  Labs Reviewed  COMPREHENSIVE METABOLIC PANEL - Abnormal; Notable for the following components:      Result Value   Glucose, Bld 150 (*)    Calcium 8.0 (*)    Albumin 3.3 (*)    AST 168 (*)    ALT 134 (*)    All other components within normal limits  CBC WITH DIFFERENTIAL/PLATELET - Abnormal; Notable for the following components:   RBC 3.88 (*)    Hemoglobin 12.0 (*)    HCT 38.7 (*)    RDW 16.1 (*)    Lymphs Abs 0.4 (*)    All other components within normal limits  CULTURE, BLOOD (ROUTINE X 2)  CULTURE, BLOOD (ROUTINE X 2)  RESPIRATORY PANEL BY RT PCR (FLU A&B, COVID)  LACTIC ACID, PLASMA  LACTIC ACID, PLASMA  PROTIME-INR  URINALYSIS, ROUTINE W REFLEX MICROSCOPIC  TROPONIN I (HIGH SENSITIVITY)  TROPONIN I (HIGH SENSITIVITY)   ____________________________________________  EKG   EKG Interpretation  Date/Time:  Saturday February 17 2020 11:50:38 EDT Ventricular Rate:  100 PR Interval:    QRS Duration: 101 QT Interval:  348 QTC Calculation: 449 R Axis:   133 Text Interpretation: Sinus tachycardia Right axis deviation No STEMI Confirmed by Nanda Quinton [phone]) on 02/17/2020 11:58:41 AM       ____________________________________________  RADIOLOGY  CT Angio Chest PE W and/or Wo Contrast  Result Date: 02/17/2020 CLINICAL DATA:  Cough. Decreased O2 sats. Pulmonary embolism suspected. Abdominal abscess/infection suspected. EXAM: CT ANGIOGRAPHY CHEST CT ABDOMEN AND PELVIS WITH CONTRAST TECHNIQUE: Multidetector CT imaging of the chest was performed using the standard protocol during bolus administration of intravenous contrast. Multiplanar CT image reconstructions and MIPs were obtained to  evaluate the vascular anatomy. Multidetector CT imaging of the abdomen and pelvis was performed using the standard protocol during bolus administration of intravenous contrast. CONTRAST:  144m OMNIPAQUE IOHEXOL 350 MG/ML SOLN COMPARISON:  Chest CT dated 05/26/2019. FINDINGS: CTA CHEST FINDINGS Cardiovascular: No convincing pulmonary embolism identified within the main, lobar or segmental pulmonary arteries bilaterally. Flow artifact is present at the junction of the RIGHT lower lobe pulmonary artery branches. There is diminution of contrast passive occasion within the more peripheral aspects of the RIGHT lower lobe suggesting vascular shunting. No thoracic aortic aneurysm or evidence of aortic dissection. No pericardial effusion. Mediastinum/Nodes: Hypertrophy of the RIGHT thyroid lobe, chronic. No mass or enlarged lymph nodes are seen within the mediastinum or perihilar regions. Esophagus is unremarkable. Trachea and central bronchi are unremarkable. Lungs/Pleura: Advanced interstitial fibrosis bilaterally, RIGHT significantly more advanced than the LEFT. No consolidation or pleural effusion. No pneumothorax. Musculoskeletal: No acute or suspicious osseous finding. Review of the MIP images confirms the above findings. CT ABDOMEN and PELVIS FINDINGS Hepatobiliary: No focal liver abnormality is seen. Small gallstone within the fundus of the  gallbladder. Gallbladder appears otherwise normal. No bile duct dilatation seen. Pancreas: Partially infiltrated with fat but otherwise unremarkable. Spleen: Normal in size without focal abnormality. Adrenals/Urinary Tract: Adrenal glands appear normal. Bilateral renal cysts. Kidneys are otherwise unremarkable without suspicious mass, stone or hydronephrosis. No ureteral or bladder calculi identified. Bladder appears normal. Stomach/Bowel: No dilated large or small bowel loops. No evidence of bowel wall inflammation. Scattered diverticulosis but no focal inflammatory change to  suggest acute diverticulitis. Appendix is normal. Stomach is unremarkable, partially decompressed. Vascular/Lymphatic: No significant vascular findings are present. No enlarged abdominal or pelvic lymph nodes. Reproductive: Prostate is unremarkable. Other: Bilateral inguinal hernias which contain fat only. No free fluid or abscess collection within the abdomen or pelvis. No free intraperitoneal air. Musculoskeletal: No acute or suspicious osseous finding. Degenerative spondylosis of the lumbar spine, mild to moderate in degree. Review of the MIP images confirms the above findings. IMPRESSION: 1. Advanced interstitial fibrosis lung disease bilaterally, RIGHT significantly more advanced than the LEFT. No consolidation or pleural effusion. 2. No acute findings within the chest. No pulmonary embolism. No pneumonia or pleural effusion. 3. No acute findings within the abdomen or pelvis. No bowel obstruction or evidence of bowel wall inflammation. No evidence of acute solid organ abnormality. No free fluid or abscess collection. No renal or ureteral calculi. Appendix is normal. 4. Colonic diverticulosis without evidence of acute diverticulitis. Electronically Signed   By: Franki Cabot [name]   On: 02/17/2020 14:57   CT ABDOMEN PELVIS W CONTRAST  Result Date: 02/17/2020 CLINICAL DATA:  Cough. Decreased O2 sats. Pulmonary embolism suspected. Abdominal abscess/infection suspected. EXAM: CT ANGIOGRAPHY CHEST CT ABDOMEN AND PELVIS WITH CONTRAST TECHNIQUE: Multidetector CT imaging of the chest was performed using the standard protocol during bolus administration of intravenous contrast. Multiplanar CT image reconstructions and MIPs were obtained to evaluate the vascular anatomy. Multidetector CT imaging of the abdomen and pelvis was performed using the standard protocol during bolus administration of intravenous contrast. CONTRAST:  19m OMNIPAQUE IOHEXOL 350 MG/ML SOLN COMPARISON:  Chest CT dated 05/26/2019. FINDINGS: CTA  CHEST FINDINGS Cardiovascular: No convincing pulmonary embolism identified within the main, lobar or segmental pulmonary arteries bilaterally. Flow artifact is present at the junction of the RIGHT lower lobe pulmonary artery branches. There is diminution of contrast passive occasion within the more peripheral aspects of the RIGHT lower lobe suggesting vascular shunting. No thoracic aortic aneurysm or evidence of aortic dissection. No pericardial effusion. Mediastinum/Nodes: Hypertrophy of the RIGHT thyroid lobe, chronic. No mass or enlarged lymph nodes are seen within the mediastinum or perihilar regions. Esophagus is unremarkable. Trachea and central bronchi are unremarkable. Lungs/Pleura: Advanced interstitial fibrosis bilaterally, RIGHT significantly more advanced than the LEFT. No consolidation or pleural effusion. No pneumothorax. Musculoskeletal: No acute or suspicious osseous finding. Review of the MIP images confirms the above findings. CT ABDOMEN and PELVIS FINDINGS Hepatobiliary: No focal liver abnormality is seen. Small gallstone within the fundus of the gallbladder. Gallbladder appears otherwise normal. No bile duct dilatation seen. Pancreas: Partially infiltrated with fat but otherwise unremarkable. Spleen: Normal in size without focal abnormality. Adrenals/Urinary Tract: Adrenal glands appear normal. Bilateral renal cysts. Kidneys are otherwise unremarkable without suspicious mass, stone or hydronephrosis. No ureteral or bladder calculi identified. Bladder appears normal. Stomach/Bowel: No dilated large or small bowel loops. No evidence of bowel wall inflammation. Scattered diverticulosis but no focal inflammatory change to suggest acute diverticulitis. Appendix is normal. Stomach is unremarkable, partially decompressed. Vascular/Lymphatic: No significant vascular findings are present. No enlarged abdominal or  pelvic lymph nodes. Reproductive: Prostate is unremarkable. Other: Bilateral inguinal hernias  which contain fat only. No free fluid or abscess collection within the abdomen or pelvis. No free intraperitoneal air. Musculoskeletal: No acute or suspicious osseous finding. Degenerative spondylosis of the lumbar spine, mild to moderate in degree. Review of the MIP images confirms the above findings. IMPRESSION: 1. Advanced interstitial fibrosis lung disease bilaterally, RIGHT significantly more advanced than the LEFT. No consolidation or pleural effusion. 2. No acute findings within the chest. No pulmonary embolism. No pneumonia or pleural effusion. 3. No acute findings within the abdomen or pelvis. No bowel obstruction or evidence of bowel wall inflammation. No evidence of acute solid organ abnormality. No free fluid or abscess collection. No renal or ureteral calculi. Appendix is normal. 4. Colonic diverticulosis without evidence of acute diverticulitis. Electronically Signed   By: Franki Cabot [name]   On: 02/17/2020 14:57   DG Chest Portable 1 View  Result Date: 02/17/2020 CLINICAL DATA:  Dyspnea, cough, fever EXAM: PORTABLE CHEST 1 VIEW COMPARISON:  05/26/2019 chest radiograph. FINDINGS: Stable cardiomediastinal silhouette with normal heart size. No pneumothorax. No pleural effusion. Extensive coarse reticular opacities throughout the right greater than left lungs, similar. No acute superimposed consolidative airspace disease. IMPRESSION: Chronic advanced fibrotic interstitial lung disease involving the right greater than left lungs, with no acute superimposed cardiopulmonary disease. Electronically Signed   By: Ilona Sorrel [name]   On: 02/17/2020 12:35    ____________________________________________   PROCEDURES  Procedure(s) performed:   .Critical Care Performed by: Margette Fast, MD Authorized by: Margette Fast, MD   Critical care provider statement:    Critical care time (minutes):  35   Critical care time was exclusive of:  Separately billable procedures and treating other patients  and teaching time   Critical care was necessary to treat or prevent imminent or life-threatening deterioration of the following conditions:  Respiratory failure   Critical care was time spent personally by me on the following activities:  Discussions with consultants, evaluation of patient's response to treatment, examination of patient, ordering and performing treatments and interventions, ordering and review of laboratory studies, ordering and review of radiographic studies, pulse oximetry, re-evaluation of patient's condition, obtaining history from patient or surrogate, review of old charts, blood draw for specimens and development of treatment plan with patient or surrogate   I assumed direction of critical care for this patient from another provider in my specialty: no       ____________________________________________   INITIAL IMPRESSION / ASSESSMENT AND PLAN / ED COURSE  Pertinent labs & imaging results that were available during my care of the patient were reviewed by me and considered in my medical decision making (see chart for details).   Patient presents to the emergency department with shortness of breath, cough, fever, increased O2 requirement.  Patient has baseline pulmonary fibrosis and COPD.  [name] not experiencing chest pain and EKG interpreted as above with no acute ischemic change.  Will treat fever and search for source of infection but patient's symptoms appear respiratory in nature.  He has been vaccinated for COVID-19 but do plan on repeat testing here along with flu and RSV.  Patient not requiring additional respiratory support at this time other than supplemental oxygen.   01:39 PM  No clear infection source at this time. CXR similar to prior with fibrosis changes. Labs reassuring. Normal lactate. Doubt sepsis but patient is in oncreased O2 requirement and LFTs are elevated mildly with normal alk  phos and bili. Plan for CTA chest and CT abdomen pelvis. Will cover with  broad spectrum abx and reassess after imaging.   CT imaging of the chest, abdomen, pelvis reviewed with no acute findings.  UA is similarly normal.  Plan to hobs overnight on antibiotics with increased oxygen requirement and fever on arrival to make sure he is clinically improving but looking well here overall.   Discussed patient's case with [facility] to request admission. Patient and family (if present) updated with plan. Care transferred to [facility] service.  I reviewed all nursing notes, vitals, pertinent old records, EKGs, labs, imaging (as available).  ____________________________________________  FINAL CLINICAL IMPRESSION(S) / ED DIAGNOSES  Final diagnoses:  Acute on chronic respiratory failure with hypoxia (H[facility])  Influenza-like illness    MEDICATIONS GIVEN DURING THIS VISIT:  Medications  vancomycin (VANCOREADY) IVPB 1500 mg/300 mL (1,500 mg Intravenous New Bag/Given 02/17/20 1513)  vancomycin (VANCOREADY) IVPB 750 mg/150 mL (has no administration in time range)  ceFEPIme (MAXIPIME) 2 g in sodium chloride 0.9 % 100 mL IVPB (has no administration in time range)  albuterol (VENTOLIN HFA) 108 (90 Base) MCG/ACT inhaler 2 puff (2 puffs Inhalation Given 02/17/20 1226)  acetaminophen (TYLENOL) tablet 1,000 mg (1,000 mg Oral Given 02/17/20 1226)  ceFEPIme (MAXIPIME) 2 g in sodium chloride 0.9 % 100 mL IVPB (2 g Intravenous New Bag/Given 02/17/20 1442)  iohexol (OMNIPAQUE) 350 MG/ML injection 100 mL (100 mLs Intravenous Contrast Given 02/17/20 1414)    Note:  This document was prepared using Dragon voice recognition software and may include unintentional dictation errors.  Nanda Quinton, MD, [facility]    Meeyah Ovitt, [facility], MD 02/17/20 [phone]

## 2020-02-17 NOTE — ED Notes (Signed)
Pt given meal tray.

## 2020-02-18 DIAGNOSIS — M069 Rheumatoid arthritis, unspecified: Secondary | ICD-10-CM | POA: Diagnosis not present

## 2020-02-18 DIAGNOSIS — J9621 Acute and chronic respiratory failure with hypoxia: Secondary | ICD-10-CM | POA: Diagnosis not present

## 2020-02-18 DIAGNOSIS — J8410 Pulmonary fibrosis, unspecified: Secondary | ICD-10-CM | POA: Diagnosis not present

## 2020-02-18 DIAGNOSIS — I10 Essential (primary) hypertension: Secondary | ICD-10-CM | POA: Diagnosis not present

## 2020-02-18 LAB — STREP PNEUMONIAE URINARY ANTIGEN: Strep Pneumo Urinary Antigen: NEGATIVE

## 2020-02-18 LAB — CBC
HCT: 35.5 % — ABNORMAL LOW (ref 39.0–52.0)
Hemoglobin: 11.1 g/dL — ABNORMAL LOW (ref 13.0–17.0)
MCH: 31.1 pg (ref 26.0–34.0)
MCHC: 31.3 g/dL (ref 30.0–36.0)
MCV: 99.4 fL (ref 80.0–100.0)
Platelets: 198 10*3/uL (ref 150–400)
RBC: 3.57 MIL/uL — ABNORMAL LOW (ref 4.22–5.81)
RDW: 16.5 % — ABNORMAL HIGH (ref 11.5–15.5)
WBC: 3.4 10*3/uL — ABNORMAL LOW (ref 4.0–10.5)
nRBC: 0 % (ref 0.0–0.2)

## 2020-02-18 LAB — BASIC METABOLIC PANEL
Anion gap: 10 (ref 5–15)
BUN: 8 mg/dL (ref 8–23)
CO2: 28 mmol/L (ref 22–32)
Calcium: 7.9 mg/dL — ABNORMAL LOW (ref 8.9–10.3)
Chloride: 102 mmol/L (ref 98–111)
Creatinine, Ser: 0.86 mg/dL (ref 0.61–1.24)
GFR calc Af Amer: >60 mL/min (ref >60)
GFR calc non Af Amer: >60 mL/min (ref >60)
Glucose, Bld: 118 mg/dL — ABNORMAL HIGH (ref 70–99)
Potassium: 4.1 mmol/L (ref 3.5–5.1)
Sodium: 140 mmol/L (ref 135–145)

## 2020-02-18 LAB — MRSA PCR SCREENING: MRSA by PCR: NEGATIVE

## 2020-02-18 NOTE — Progress Notes (Signed)
PROGRESS NOTE   [name]  ZHG:[id number] DOB: April 15, 1946 DOA: 02/17/2020 PCP: Merrilee Seashore, MD   Chief Complaint  Patient presents with  . Fever    Brief Narrative:  74 y.o. male, has medical history of hypertension, diabetes mellitus, pulmonary fibrosis, chronic respiratory failure on 4 L nasal cannula at bedtime, COPD, he presents to ED secondary for complaints of feeling bad, when he asked to specify, reports fatigue, patient reports dyspnea at baseline, cough at baseline, he reports feeling feverish here in ED, he denies any fever at home, he denies any dysuria, polyuria, chest pain, abdominal pain, no nausea, vomiting or diarrhea, reports he received his second vaccine almost 10 days ago for COVID-19.  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 SIRS - patient presented with fever up to 101.4 and respiratory rate up to 32 but he is slowly improving.  [name] immunocompromise.  His blood cultures are in process no growth to date.  Continue antibiotic coverage.  Discontinue vancomycin as MRSA screen is negative.  Continue cefepime.  If he remains afebrile over next 24 hours can de-escalate antibiotics and discharged home.  2 coronavirus test negative. 2. Rheumatoid arthritis-currently stable continue home prednisone hold azathioprine.  3. IPF - Dr. Waldron Labs discussed with patient's pulmonologist Dr. Chase Caller.  The recommendation was to hold OFEV with elevated LFTs and outpatient follow up with Dr. Chase Caller.    DVT prophylaxis:  Heparin  Code Status: Full  Disposition:   Status is: Observation  The patient remains OBS appropriate and will d/c before 2 midnights.  Dispo: The patient is from: Home              Anticipated d/c is to: Home              Anticipated d/c date is:  1 day              Patient currently is not medically stable to d/c.    Consultants:     Procedures:     Antimicrobials: cefepime   Subjective: Pt without complaints  Objective: Vitals:   02/18/20 0918 02/18/20 0944 02/18/20 0946 02/18/20 1347  BP: (!) 121/59 (!) 117/56  128/61  Pulse:  80  84  Resp:  16  16  Temp:  98.2 F (36.8 C)  98.7 F (37.1 C)  TempSrc:  Oral  Oral  SpO2:  99% 98% 100%  Weight:      Height:        Intake/Output Summary (Last 24 hours) at 02/18/2020 1455 Last data filed at 02/18/2020 1250 Gross per 24 hour  Intake 1210.46 ml  Output 1625 ml  Net -414.54 ml   Filed Weights   02/17/20 1141  Weight: 83.9 kg    Examination:  General exam: Appears calm and comfortable  Respiratory system: Clear to auscultation. Respiratory effort normal. Cardiovascular system: S1 & S2 heard, RRR. No JVD, murmurs, rubs, gallops or clicks. No pedal edema. Gastrointestinal system: Abdomen is nondistended, soft and nontender. No organomegaly or masses felt. Normal bowel sounds heard. Central nervous system: Alert and oriented. No focal neurological deficits. Extremities: Symmetric 5 x 5 power. Skin: No rashes, lesions or ulcers Psychiatry: Judgement and insight appear normal. Mood & affect appropriate.   Data Reviewed: I have personally  reviewed following labs and imaging studies  CBC: Recent Labs  Lab 02/17/20 1200 02/18/20 0814  WBC 4.0 3.4*  NEUTROABS 3.3  --   HGB 12.0* 11.1*  HCT 38.7* 35.5*  MCV 99.7 99.4  PLT 206 165    Basic Metabolic Panel: Recent Labs  Lab 02/15/20 1041 02/17/20 1200 02/18/20 0814  NA 138 137 140  K 3.9 4.2 4.1  CL 101 99 102  CO2 _0 GLUCOSE 166* 150* 118*  BUN _1 CREATININE 0.83 1.14 0.86  CALCIUM 8.8 8.0* 7.9*    GFR: Estimated Creatinine Clearance: 76.5 mL/min (by C-G formula based on SCr of 0.86 mg/dL).  Liver Function Tests: Recent Labs  Lab 02/15/20 1041 02/17/20 1200 02/17/20 1435    AST 37  37 168* 134*  ALT 32  32 134* 110*  ALKPHOS 59  59 84 71  BILITOT 0.4  0.4 1.0 0.8  PROT 7.2  7.2 6.7 5.7*  ALBUMIN 3.9  3.9 3.3* 2.8*    CBG: No results for input(s): GLUCAP in the last 168 hours.   Recent Results (from the past 240 hour(s))  Culture, blood (Routine x 2)     Status: None (Preliminary result)   Collection Time: 02/17/20 12:12 PM   Specimen: Left Antecubital; Blood  Result Value Ref Range Status   Specimen Description   Final    LEFT ANTECUBITAL BOTTLES DRAWN AEROBIC AND ANAEROBIC   Special Requests Blood Culture adequate volume  Final   Culture   Final    NO GROWTH 1 DAY Performed at [facility], [address], [place], [place] [address]    Report Status PENDING  Incomplete  Culture, blood (Routine x 2)     Status: None (Preliminary result)   Collection Time: 02/17/20 12:13 PM   Specimen: BLOOD LEFT ARM  Result Value Ref Range Status   Specimen Description BLOOD LEFT ARM BOTTLES DRAWN AEROBIC AND ANAEROBIC  Final   Special Requests Blood Culture adequate volume  Final   Culture   Final    NO GROWTH 1 DAY Performed at [facility], [address], [place], [place] [address]    Report Status PENDING  Incomplete  Respiratory Panel by RT PCR (Flu A&B, Covid) - Nasopharyngeal Swab     Status: None   Collection Time: 02/17/20 12:30 PM   Specimen: Nasopharyngeal Swab  Result Value Ref Range Status   SARS Coronavirus 2 by RT PCR NEGATIVE NEGATIVE Final    Comment: (NOTE) SARS-CoV-2 target nucleic acids are NOT DETECTED. The SARS-CoV-2 RNA is generally detectable in upper respiratoy specimens during the acute phase of infection. The lowest concentration of SARS-CoV-2 viral copies this assay can detect is 131 copies/mL. A negative result does not preclude SARS-Cov-2 infection and should not be used as the sole basis for treatment or other patient management decisions. A negative result may occur with  improper specimen collection/handling,  submission of specimen other than nasopharyngeal swab, presence of viral mutation(s) within the areas targeted by this assay, and inadequate number of viral copies (<131 copies/mL). A negative result must be combined with clinical observations, patient history, and epidemiological information. The expected result is Negative. Fact Sheet for Patients:  PinkCheek.be Fact Sheet for Healthcare Providers:  GravelBags.it This test is not yet ap proved or cleared by the [place] FDA and  has been authorized for detection and/or diagnosis of SARS-CoV-2 by FDA under an Emergency Use Authorization (EUA). This EUA will remain  in effect (  meaning this test can be used) for the duration of the COVID-19 declaration under Section 564(b)(1) of the Act, 21 U.S.C. section 360bbb-3(b)(1), unless the authorization is terminated or revoked sooner.    Influenza A by PCR NEGATIVE NEGATIVE Final   Influenza B by PCR NEGATIVE NEGATIVE Final    Comment: (NOTE) The Xpert Xpress SARS-CoV-2/FLU/RSV assay is intended as an aid in  the diagnosis of influenza from Nasopharyngeal swab specimens and  should not be used as a sole basis for treatment. Nasal washings and  aspirates are unacceptable for Xpert Xpress SARS-CoV-2/FLU/RSV  testing. Fact Sheet for Patients: PinkCheek.be Fact Sheet for Healthcare Providers: GravelBags.it This test is not yet approved or cleared by the [place] FDA and  has been authorized for detection and/or diagnosis of SARS-CoV-2 by  FDA under an Emergency Use Authorization (EUA). This EUA will remain  in effect (meaning this test can be used) for the duration of the  Covid-19 declaration under Section 564(b)(1) of the Act, 21  U.S.C. section 360bbb-3(b)(1), unless the authorization is  terminated or revoked. Performed at [facility], [address]  [place], [place] [address]   MRSA PCR Screening     Status: None   Collection Time: 02/18/20  4:10 AM   Specimen: Nasal Mucosa; Nasopharyngeal  Result Value Ref Range Status   MRSA by PCR NEGATIVE NEGATIVE Final    Comment:        The GeneXpert MRSA Assay (FDA approved for NASAL specimens only), is one component of a comprehensive MRSA colonization surveillance program. It is not intended to diagnose MRSA infection nor to guide or monitor treatment for MRSA infections. Performed at [facility], [address] [place], [place] [address]      Radiology Studies: CT Angio Chest PE W and/or Wo Contrast  Result Date: 02/17/2020 CLINICAL DATA:  Cough. Decreased O2 sats. Pulmonary embolism suspected. Abdominal abscess/infection suspected. EXAM: CT ANGIOGRAPHY CHEST CT ABDOMEN AND PELVIS WITH CONTRAST TECHNIQUE: Multidetector CT imaging of the chest was performed using the standard protocol during bolus administration of intravenous contrast. Multiplanar CT image reconstructions and MIPs were obtained to evaluate the vascular anatomy. Multidetector CT imaging of the abdomen and pelvis was performed using the standard protocol during bolus administration of intravenous contrast. CONTRAST:  18m OMNIPAQUE IOHEXOL 350 MG/ML SOLN COMPARISON:  Chest CT dated 05/26/2019. FINDINGS: CTA CHEST FINDINGS Cardiovascular: No convincing pulmonary embolism identified within the main, lobar or segmental pulmonary arteries bilaterally. Flow artifact is present at the junction of the RIGHT lower lobe pulmonary artery branches. There is diminution of contrast passive occasion within the more peripheral aspects of the RIGHT lower lobe suggesting vascular shunting. No thoracic aortic aneurysm or evidence of aortic dissection. No pericardial effusion. Mediastinum/Nodes: Hypertrophy of the RIGHT thyroid lobe, chronic. No mass or enlarged lymph nodes are seen within the mediastinum or perihilar regions. Esophagus is  unremarkable. Trachea and central bronchi are unremarkable. Lungs/Pleura: Advanced interstitial fibrosis bilaterally, RIGHT significantly more advanced than the LEFT. No consolidation or pleural effusion. No pneumothorax. Musculoskeletal: No acute or suspicious osseous finding. Review of the MIP images confirms the above findings. CT ABDOMEN and PELVIS FINDINGS Hepatobiliary: No focal liver abnormality is seen. Small gallstone within the fundus of the gallbladder. Gallbladder appears otherwise normal. No bile duct dilatation seen. Pancreas: Partially infiltrated with fat but otherwise unremarkable. Spleen: Normal in size without focal abnormality. Adrenals/Urinary Tract: Adrenal glands appear normal. Bilateral renal cysts. Kidneys are otherwise unremarkable without suspicious mass, stone or hydronephrosis. No ureteral  or bladder calculi identified. Bladder appears normal. Stomach/Bowel: No dilated large or small bowel loops. No evidence of bowel wall inflammation. Scattered diverticulosis but no focal inflammatory change to suggest acute diverticulitis. Appendix is normal. Stomach is unremarkable, partially decompressed. Vascular/Lymphatic: No significant vascular findings are present. No enlarged abdominal or pelvic lymph nodes. Reproductive: Prostate is unremarkable. Other: Bilateral inguinal hernias which contain fat only. No free fluid or abscess collection within the abdomen or pelvis. No free intraperitoneal air. Musculoskeletal: No acute or suspicious osseous finding. Degenerative spondylosis of the lumbar spine, mild to moderate in degree. Review of the MIP images confirms the above findings. IMPRESSION: 1. Advanced interstitial fibrosis lung disease bilaterally, RIGHT significantly more advanced than the LEFT. No consolidation or pleural effusion. 2. No acute findings within the chest. No pulmonary embolism. No pneumonia or pleural effusion. 3. No acute findings within the abdomen or pelvis. No bowel  obstruction or evidence of bowel wall inflammation. No evidence of acute solid organ abnormality. No free fluid or abscess collection. No renal or ureteral calculi. Appendix is normal. 4. Colonic diverticulosis without evidence of acute diverticulitis. Electronically Signed   By: Franki Cabot [name]   On: 02/17/2020 14:57   CT ABDOMEN PELVIS W CONTRAST  Result Date: 02/17/2020 CLINICAL DATA:  Cough. Decreased O2 sats. Pulmonary embolism suspected. Abdominal abscess/infection suspected. EXAM: CT ANGIOGRAPHY CHEST CT ABDOMEN AND PELVIS WITH CONTRAST TECHNIQUE: Multidetector CT imaging of the chest was performed using the standard protocol during bolus administration of intravenous contrast. Multiplanar CT image reconstructions and MIPs were obtained to evaluate the vascular anatomy. Multidetector CT imaging of the abdomen and pelvis was performed using the standard protocol during bolus administration of intravenous contrast. CONTRAST:  168m OMNIPAQUE IOHEXOL 350 MG/ML SOLN COMPARISON:  Chest CT dated 05/26/2019. FINDINGS: CTA CHEST FINDINGS Cardiovascular: No convincing pulmonary embolism identified within the main, lobar or segmental pulmonary arteries bilaterally. Flow artifact is present at the junction of the RIGHT lower lobe pulmonary artery branches. There is diminution of contrast passive occasion within the more peripheral aspects of the RIGHT lower lobe suggesting vascular shunting. No thoracic aortic aneurysm or evidence of aortic dissection. No pericardial effusion. Mediastinum/Nodes: Hypertrophy of the RIGHT thyroid lobe, chronic. No mass or enlarged lymph nodes are seen within the mediastinum or perihilar regions. Esophagus is unremarkable. Trachea and central bronchi are unremarkable. Lungs/Pleura: Advanced interstitial fibrosis bilaterally, RIGHT significantly more advanced than the LEFT. No consolidation or pleural effusion. No pneumothorax. Musculoskeletal: No acute or suspicious osseous finding.  Review of the MIP images confirms the above findings. CT ABDOMEN and PELVIS FINDINGS Hepatobiliary: No focal liver abnormality is seen. Small gallstone within the fundus of the gallbladder. Gallbladder appears otherwise normal. No bile duct dilatation seen. Pancreas: Partially infiltrated with fat but otherwise unremarkable. Spleen: Normal in size without focal abnormality. Adrenals/Urinary Tract: Adrenal glands appear normal. Bilateral renal cysts. Kidneys are otherwise unremarkable without suspicious mass, stone or hydronephrosis. No ureteral or bladder calculi identified. Bladder appears normal. Stomach/Bowel: No dilated large or small bowel loops. No evidence of bowel wall inflammation. Scattered diverticulosis but no focal inflammatory change to suggest acute diverticulitis. Appendix is normal. Stomach is unremarkable, partially decompressed. Vascular/Lymphatic: No significant vascular findings are present. No enlarged abdominal or pelvic lymph nodes. Reproductive: Prostate is unremarkable. Other: Bilateral inguinal hernias which contain fat only. No free fluid or abscess collection within the abdomen or pelvis. No free intraperitoneal air. Musculoskeletal: No acute or suspicious osseous finding. Degenerative spondylosis of the lumbar spine, mild to moderate  in degree. Review of the MIP images confirms the above findings. IMPRESSION: 1. Advanced interstitial fibrosis lung disease bilaterally, RIGHT significantly more advanced than the LEFT. No consolidation or pleural effusion. 2. No acute findings within the chest. No pulmonary embolism. No pneumonia or pleural effusion. 3. No acute findings within the abdomen or pelvis. No bowel obstruction or evidence of bowel wall inflammation. No evidence of acute solid organ abnormality. No free fluid or abscess collection. No renal or ureteral calculi. Appendix is normal. 4. Colonic diverticulosis without evidence of acute diverticulitis. Electronically Signed   By: Franki Cabot [name]   On: 02/17/2020 14:57   DG Chest Portable 1 View  Result Date: 02/17/2020 CLINICAL DATA:  Dyspnea, cough, fever EXAM: PORTABLE CHEST 1 VIEW COMPARISON:  05/26/2019 chest radiograph. FINDINGS: Stable cardiomediastinal silhouette with normal heart size. No pneumothorax. No pleural effusion. Extensive coarse reticular opacities throughout the right greater than left lungs, similar. No acute superimposed consolidative airspace disease. IMPRESSION: Chronic advanced fibrotic interstitial lung disease involving the right greater than left lungs, with no acute superimposed cardiopulmonary disease. Electronically Signed   By: Ilona Sorrel [name]   On: 02/17/2020 12:35   Scheduled Meds: . amLODipine  10 mg Oral Daily  . aspirin  81 mg Oral Daily  . heparin  5,000 Units Subcutaneous Q8H  . levothyroxine  25 mcg Oral Daily  . pantoprazole  40 mg Oral Daily  . predniSONE  1 mg Oral BID WC  . umeclidinium bromide  2 puff Inhalation Daily   Continuous Infusions: . ceFEPime (MAXIPIME) IV 2 g (02/18/20 0923)     LOS: 0 days    Time spent: 24 mins  Jasraj Lappe Wynetta Emery, MD [facility]   To contact the attending provider between 7A-7P or the covering provider during after hours 7P-7A, please log into the web site [email or web address] and access using universal [place] password for that web site. If you do not have the password, please call the hospital operator.  02/18/2020, 2:55 PM

## 2020-02-18 NOTE — Care Management Obs Status (Signed)
[place] NOTIFICATION   Patient Details  Name: [name] MRN: [id number] Date of Birth: 10-05-1946   Medicare Observation Status Notification Given:  Yes    Shade Flood, LCSW 02/18/2020, 3:57 PM

## 2020-02-19 DIAGNOSIS — J8410 Pulmonary fibrosis, unspecified: Secondary | ICD-10-CM | POA: Diagnosis not present

## 2020-02-19 DIAGNOSIS — J9621 Acute and chronic respiratory failure with hypoxia: Secondary | ICD-10-CM | POA: Diagnosis not present

## 2020-02-19 DIAGNOSIS — I10 Essential (primary) hypertension: Secondary | ICD-10-CM | POA: Diagnosis not present

## 2020-02-19 DIAGNOSIS — M069 Rheumatoid arthritis, unspecified: Secondary | ICD-10-CM | POA: Diagnosis not present

## 2020-02-19 LAB — CBC WITH DIFFERENTIAL/PLATELET
Abs Immature Granulocytes: 0.01 10*3/uL (ref 0.00–0.07)
Basophils Absolute: 0 10*3/uL (ref 0.0–0.1)
Basophils Relative: 1 %
Eosinophils Absolute: 0.4 10*3/uL (ref 0.0–0.5)
Eosinophils Relative: 12 %
HCT: 33.6 % — ABNORMAL LOW (ref 39.0–52.0)
Hemoglobin: 10.6 g/dL — ABNORMAL LOW (ref 13.0–17.0)
Immature Granulocytes: 0 %
Lymphocytes Relative: 14 %
Lymphs Abs: 0.5 10*3/uL — ABNORMAL LOW (ref 0.7–4.0)
MCH: 31.2 pg (ref 26.0–34.0)
MCHC: 31.5 g/dL (ref 30.0–36.0)
MCV: 98.8 fL (ref 80.0–100.0)
Monocytes Absolute: 0.3 10*3/uL (ref 0.1–1.0)
Monocytes Relative: 10 %
Neutro Abs: 2.1 10*3/uL (ref 1.7–7.7)
Neutrophils Relative %: 63 %
Platelets: 192 10*3/uL (ref 150–400)
RBC: 3.4 MIL/uL — ABNORMAL LOW (ref 4.22–5.81)
RDW: 16.4 % — ABNORMAL HIGH (ref 11.5–15.5)
WBC: 3.4 10*3/uL — ABNORMAL LOW (ref 4.0–10.5)
nRBC: 0 % (ref 0.0–0.2)

## 2020-02-19 LAB — COMPREHENSIVE METABOLIC PANEL
ALT: 96 U/L — ABNORMAL HIGH (ref 0–44)
AST: 92 U/L — ABNORMAL HIGH (ref 15–41)
Albumin: 2.7 g/dL — ABNORMAL LOW (ref 3.5–5.0)
Alkaline Phosphatase: 76 U/L (ref 38–126)
Anion gap: 8 (ref 5–15)
BUN: 9 mg/dL (ref 8–23)
CO2: 29 mmol/L (ref 22–32)
Calcium: 7.9 mg/dL — ABNORMAL LOW (ref 8.9–10.3)
Chloride: 104 mmol/L (ref 98–111)
Creatinine, Ser: 0.73 mg/dL (ref 0.61–1.24)
GFR calc Af Amer: >60 mL/min (ref >60)
GFR calc non Af Amer: >60 mL/min (ref >60)
Glucose, Bld: 132 mg/dL — ABNORMAL HIGH (ref 70–99)
Potassium: 4 mmol/L (ref 3.5–5.1)
Sodium: 141 mmol/L (ref 135–145)
Total Bilirubin: 0.6 mg/dL (ref 0.3–1.2)
Total Protein: 5.9 g/dL — ABNORMAL LOW (ref 6.5–8.1)

## 2020-02-19 LAB — MAGNESIUM: Magnesium: 1.8 mg/dL (ref 1.7–2.4)

## 2020-02-19 MED ORDER — DOXYCYCLINE HYCLATE 100 MG PO CAPS: 100.0000 mg | ORAL_CAPSULE | Freq: Two times a day (BID) | ORAL | 0 refills | Status: AC

## 2020-02-19 NOTE — Plan of Care (Signed)
  Problem: Activity: Goal: Risk for activity intolerance will decrease Outcome: Progressing   Problem: Nutrition: Goal: Adequate nutrition will be maintained Outcome: Progressing   Problem: Elimination: Goal: Will not experience complications related to urinary retention Outcome: Progressing

## 2020-02-19 NOTE — Discharge Summary (Signed)
Physician Discharge Summary  Angeles Paolucci STM:[id number] DOB: 1945/12/01 DOA: 02/17/2020  PCP: Merrilee Seashore, MD  Admit date: 02/17/2020 Discharge date: 02/19/2020  Admitted From:  HOME  Disposition: HOME   Recommendations for Outpatient Follow-up:  1. Follow up with PCP in 1 weeks 2. Please follow up with pulmonologist in 1-2 weeks    Discharge Condition: STABLE  CODE STATUS: FULL  Brief Hospitalization Summary: Please see all hospital notes, images, labs for full details of the hospitalization. [name]  is a 74 y.o. male, has medical history of hypertension, diabetes mellitus, pulmonary fibrosis, chronic respiratory failure on 4 L nasal cannula at bedtime, COPD, he presents to ED secondary for complaints of feeling bad, when he asked to specify, reports fatigue, patient reports dyspnea at baseline, cough at baseline, he reports feeling feverish here in ED, he denies any fever at home, he denies any dysuria, polyuria, chest pain, abdominal pain, no nausea, vomiting or diarrhea, reports he received his second vaccine almost 10 days ago for COVID-19. - in ED he was febrile 101.2, with elevated LFTs, CTA chest negative for PE, as well no evidence of acute infectious process in the lung, his UA has been negative as well, given his immunocompromise with azathioprine and prednisone for rheumatoid arthritis and pulmonary fibrosis, he was started on broad-spectrum antibiotic and I was called to admit.  Pt was monitored under observation to be sure he remained afebrile for 24 hours.  He was given gentle hydration and supportive care.  He responded very well.  No fever.  Labs improved and feeling well to go home.    He will discharge on 5 days of oral doxycycline with close outpatient follow up.     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   aspirin 81 MG tablet Take 81 mg by mouth daily.   atorvastatin 20 MG tablet Commonly known as: LIPITOR Take 20 mg by mouth daily.   azaTHIOprine 50 MG tablet Commonly known as: IMURAN Take 100 mg by mouth daily. Take two tablets by mouth in the morning   doxycycline 100 MG capsule Commonly known as: VIBRAMYCIN Take 1 capsule (100 mg total) by mouth 2 (two) times daily for 5 days.   levothyroxine 25 MCG tablet Commonly known as: SYNTHROID Take 25 mcg by mouth daily.   metFORMIN 500 MG 24 hr tablet Commonly known as: GLUCOPHAGE-XR Take 2 tablets by mouth 2 (two) times daily.   pantoprazole 40 MG tablet Commonly known as: PROTONIX Take 40 mg by mouth daily.   predniSONE 1 MG tablet Commonly known as: DELTASONE Take 1 mg by mouth in the morning, at noon, and at bedtime.   Spiriva Respimat 2.5 MCG/ACT Aers Generic drug: Tiotropium Bromide Monohydrate Inhale 2 puffs into the lungs daily.      Follow-up Information    Merrilee Seashore, MD. Schedule an appointment as soon  as possible for a visit in 1 week(s).   Specialty: Internal Medicine Contact information: [address] [place] [address] [phone]        Brand Males, MD. Schedule an appointment as soon as possible for a visit in 1 week(s).   Specialty: Pulmonary Disease Why: Hospital Follow Up  Contact information: [place] [address] [phone]          Allergies  Allergen Reactions  . Bactrim [Sulfamethoxazole-Trimethoprim]      Patient states "got short winded"   Allergies as of 02/19/2020      Reactions   Bactrim [sulfamethoxazole-trimethoprim]    Patient states "got short winded"      Medication List    STOP taking these medications   Ofev 150 MG Caps Generic drug: Nintedanib     TAKE these medications   amLODipine 10 MG tablet Commonly known as: NORVASC Take 10 mg by mouth daily.   aspirin 81 MG tablet Take 81 mg by mouth daily.   atorvastatin 20 MG tablet Commonly known as: LIPITOR Take 20 mg by mouth daily.   azaTHIOprine 50 MG tablet Commonly known as: IMURAN Take 100 mg by mouth daily. Take two tablets by mouth in the morning   doxycycline 100 MG capsule Commonly known as: VIBRAMYCIN Take 1 capsule (100 mg total) by mouth 2 (two) times daily for 5 days.   levothyroxine 25 MCG tablet Commonly known as: SYNTHROID Take 25 mcg by mouth daily.   metFORMIN 500 MG 24 hr tablet Commonly known as: GLUCOPHAGE-XR Take 2 tablets by mouth 2 (two) times daily.   pantoprazole 40 MG tablet Commonly known as: PROTONIX Take 40 mg by mouth daily.   predniSONE 1 MG tablet Commonly known as: DELTASONE Take 1 mg by mouth in the morning, at noon, and at bedtime.   Spiriva Respimat 2.5 MCG/ACT Aers Generic drug: Tiotropium Bromide Monohydrate Inhale 2 puffs into the lungs daily.       Procedures/Studies: CT Angio Chest PE W and/or Wo Contrast  Result Date: 02/17/2020 CLINICAL DATA:  Cough. Decreased O2 sats. Pulmonary embolism suspected. Abdominal abscess/infection suspected. EXAM: CT ANGIOGRAPHY CHEST CT ABDOMEN AND PELVIS WITH CONTRAST TECHNIQUE: Multidetector CT imaging of the chest was performed using the standard protocol during bolus administration of intravenous contrast. Multiplanar CT image reconstructions and MIPs were obtained to evaluate the vascular anatomy. Multidetector CT imaging of the abdomen and pelvis was performed using the standard protocol during bolus administration of  intravenous contrast. CONTRAST:  11m OMNIPAQUE IOHEXOL 350 MG/ML SOLN COMPARISON:  Chest CT dated 05/26/2019. FINDINGS: CTA CHEST FINDINGS Cardiovascular: No convincing pulmonary embolism identified within the main, lobar or segmental pulmonary arteries bilaterally. Flow artifact is present at the junction of the RIGHT lower lobe pulmonary artery branches. There is diminution of contrast passive occasion within the more peripheral aspects of the RIGHT lower lobe suggesting vascular shunting. No thoracic aortic aneurysm or evidence of aortic dissection. No pericardial effusion. Mediastinum/Nodes: Hypertrophy of the RIGHT thyroid lobe, chronic. No mass or enlarged lymph nodes are seen within the mediastinum or perihilar regions. Esophagus is unremarkable. Trachea and central bronchi are unremarkable. Lungs/Pleura: Advanced interstitial fibrosis bilaterally, RIGHT significantly more advanced than the LEFT. No consolidation or pleural effusion. No pneumothorax. Musculoskeletal: No acute or suspicious osseous finding. Review of the MIP images confirms the above findings. CT ABDOMEN and PELVIS FINDINGS Hepatobiliary: No focal liver abnormality is seen. Small gallstone within the fundus of the gallbladder. Gallbladder  appears otherwise normal. No bile duct dilatation seen. Pancreas: Partially infiltrated with fat but otherwise unremarkable. Spleen: Normal in size without focal abnormality. Adrenals/Urinary Tract: Adrenal glands appear normal. Bilateral renal cysts. Kidneys are otherwise unremarkable without suspicious mass, stone or hydronephrosis. No ureteral or bladder calculi identified. Bladder appears normal. Stomach/Bowel: No dilated large or small bowel loops. No evidence of bowel wall inflammation. Scattered diverticulosis but no focal inflammatory change to suggest acute diverticulitis. Appendix is normal. Stomach is unremarkable, partially decompressed. Vascular/Lymphatic: No significant vascular findings are  present. No enlarged abdominal or pelvic lymph nodes. Reproductive: Prostate is unremarkable. Other: Bilateral inguinal hernias which contain fat only. No free fluid or abscess collection within the abdomen or pelvis. No free intraperitoneal air. Musculoskeletal: No acute or suspicious osseous finding. Degenerative spondylosis of the lumbar spine, mild to moderate in degree. Review of the MIP images confirms the above findings. IMPRESSION: 1. Advanced interstitial fibrosis lung disease bilaterally, RIGHT significantly more advanced than the LEFT. No consolidation or pleural effusion. 2. No acute findings within the chest. No pulmonary embolism. No pneumonia or pleural effusion. 3. No acute findings within the abdomen or pelvis. No bowel obstruction or evidence of bowel wall inflammation. No evidence of acute solid organ abnormality. No free fluid or abscess collection. No renal or ureteral calculi. Appendix is normal. 4. Colonic diverticulosis without evidence of acute diverticulitis. Electronically Signed   By: Franki Cabot [name]   On: 02/17/2020 14:57   CT ABDOMEN PELVIS W CONTRAST  Result Date: 02/17/2020 CLINICAL DATA:  Cough. Decreased O2 sats. Pulmonary embolism suspected. Abdominal abscess/infection suspected. EXAM: CT ANGIOGRAPHY CHEST CT ABDOMEN AND PELVIS WITH CONTRAST TECHNIQUE: Multidetector CT imaging of the chest was performed using the standard protocol during bolus administration of intravenous contrast. Multiplanar CT image reconstructions and MIPs were obtained to evaluate the vascular anatomy. Multidetector CT imaging of the abdomen and pelvis was performed using the standard protocol during bolus administration of intravenous contrast. CONTRAST:  140m OMNIPAQUE IOHEXOL 350 MG/ML SOLN COMPARISON:  Chest CT dated 05/26/2019. FINDINGS: CTA CHEST FINDINGS Cardiovascular: No convincing pulmonary embolism identified within the main, lobar or segmental pulmonary arteries bilaterally. Flow artifact  is present at the junction of the RIGHT lower lobe pulmonary artery branches. There is diminution of contrast passive occasion within the more peripheral aspects of the RIGHT lower lobe suggesting vascular shunting. No thoracic aortic aneurysm or evidence of aortic dissection. No pericardial effusion. Mediastinum/Nodes: Hypertrophy of the RIGHT thyroid lobe, chronic. No mass or enlarged lymph nodes are seen within the mediastinum or perihilar regions. Esophagus is unremarkable. Trachea and central bronchi are unremarkable. Lungs/Pleura: Advanced interstitial fibrosis bilaterally, RIGHT significantly more advanced than the LEFT. No consolidation or pleural effusion. No pneumothorax. Musculoskeletal: No acute or suspicious osseous finding. Review of the MIP images confirms the above findings. CT ABDOMEN and PELVIS FINDINGS Hepatobiliary: No focal liver abnormality is seen. Small gallstone within the fundus of the gallbladder. Gallbladder appears otherwise normal. No bile duct dilatation seen. Pancreas: Partially infiltrated with fat but otherwise unremarkable. Spleen: Normal in size without focal abnormality. Adrenals/Urinary Tract: Adrenal glands appear normal. Bilateral renal cysts. Kidneys are otherwise unremarkable without suspicious mass, stone or hydronephrosis. No ureteral or bladder calculi identified. Bladder appears normal. Stomach/Bowel: No dilated large or small bowel loops. No evidence of bowel wall inflammation. Scattered diverticulosis but no focal inflammatory change to suggest acute diverticulitis. Appendix is normal. Stomach is unremarkable, partially decompressed. Vascular/Lymphatic: No significant vascular findings are present. No enlarged abdominal or pelvic lymph  nodes. Reproductive: Prostate is unremarkable. Other: Bilateral inguinal hernias which contain fat only. No free fluid or abscess collection within the abdomen or pelvis. No free intraperitoneal air. Musculoskeletal: No acute or  suspicious osseous finding. Degenerative spondylosis of the lumbar spine, mild to moderate in degree. Review of the MIP images confirms the above findings. IMPRESSION: 1. Advanced interstitial fibrosis lung disease bilaterally, RIGHT significantly more advanced than the LEFT. No consolidation or pleural effusion. 2. No acute findings within the chest. No pulmonary embolism. No pneumonia or pleural effusion. 3. No acute findings within the abdomen or pelvis. No bowel obstruction or evidence of bowel wall inflammation. No evidence of acute solid organ abnormality. No free fluid or abscess collection. No renal or ureteral calculi. Appendix is normal. 4. Colonic diverticulosis without evidence of acute diverticulitis. Electronically Signed   By: Franki Cabot [name]   On: 02/17/2020 14:57   DG Chest Portable 1 View  Result Date: 02/17/2020 CLINICAL DATA:  Dyspnea, cough, fever EXAM: PORTABLE CHEST 1 VIEW COMPARISON:  05/26/2019 chest radiograph. FINDINGS: Stable cardiomediastinal silhouette with normal heart size. No pneumothorax. No pleural effusion. Extensive coarse reticular opacities throughout the right greater than left lungs, similar. No acute superimposed consolidative airspace disease. IMPRESSION: Chronic advanced fibrotic interstitial lung disease involving the right greater than left lungs, with no acute superimposed cardiopulmonary disease. Electronically Signed   By: Ilona Sorrel [name]   On: 02/17/2020 12:35     Subjective: Pt says he feels much better today.  No fever. No chills.    Discharge Exam: Vitals:   02/19/20 0754 02/19/20 0757  BP:    Pulse:    Resp:    Temp:    SpO2: 98% 99%   Vitals:   02/18/20 2108 02/19/20 0531 02/19/20 0754 02/19/20 0757  BP: 122/60 125/60    Pulse: 77 69    Resp: 17 19    Temp: 98.3 F (36.8 C) 98.3 F (36.8 C)    TempSrc: Oral Oral    SpO2: 97% 100% 98% 99%  Weight:      Height:        General: Pt is alert, awake, not in acute  distress Cardiovascular: RRR, S1/S2 +, no rubs, no gallops Respiratory: CTA bilaterally, no wheezing, no rhonchi Abdominal: Soft, NT, ND, bowel sounds + Extremities: no edema, no cyanosis   The results of significant diagnostics from this hospitalization (including imaging, microbiology, ancillary and laboratory) are listed below for reference.     Microbiology: Recent Results (from the past 240 hour(s))  Culture, blood (Routine x 2)     Status: None (Preliminary result)   Collection Time: 02/17/20 12:12 PM   Specimen: Left Antecubital; Blood  Result Value Ref Range Status   Specimen Description   Final    LEFT ANTECUBITAL BOTTLES DRAWN AEROBIC AND ANAEROBIC   Special Requests Blood Culture adequate volume  Final   Culture   Final    NO GROWTH 2 DAYS Performed at [facility], [address], [place], [place] [address]    Report Status PENDING  Incomplete  Culture, blood (Routine x 2)     Status: None (Preliminary result)   Collection Time: 02/17/20 12:13 PM   Specimen: BLOOD LEFT ARM  Result Value Ref Range Status   Specimen Description BLOOD LEFT ARM BOTTLES DRAWN AEROBIC AND ANAEROBIC  Final   Special Requests Blood Culture adequate volume  Final   Culture   Final    NO GROWTH 2 DAYS Performed at [facility],  [address], [place], [place] [address]    Report Status PENDING  Incomplete  Respiratory Panel by RT PCR (Flu A&B, Covid) - Nasopharyngeal Swab     Status: None   Collection Time: 02/17/20 12:30 PM   Specimen: Nasopharyngeal Swab  Result Value Ref Range Status   SARS Coronavirus 2 by RT PCR NEGATIVE NEGATIVE Final    Comment: (NOTE) SARS-CoV-2 target nucleic acids are NOT DETECTED. The SARS-CoV-2 RNA is generally detectable in upper respiratoy specimens during the acute phase of infection. The lowest concentration of SARS-CoV-2 viral copies this assay can detect is 131 copies/mL. A negative result does not preclude SARS-Cov-2 infection and should not be used as  the sole basis for treatment or other patient management decisions. A negative result may occur with  improper specimen collection/handling, submission of specimen other than nasopharyngeal swab, presence of viral mutation(s) within the areas targeted by this assay, and inadequate number of viral copies (<131 copies/mL). A negative result must be combined with clinical observations, patient history, and epidemiological information. The expected result is Negative. Fact Sheet for Patients:  PinkCheek.be Fact Sheet for Healthcare Providers:  GravelBags.it This test is not yet ap proved or cleared by the [place] FDA and  has been authorized for detection and/or diagnosis of SARS-CoV-2 by FDA under an Emergency Use Authorization (EUA). This EUA will remain  in effect (meaning this test can be used) for the duration of the COVID-19 declaration under Section 564(b)(1) of the Act, 21 U.S.C. section 360bbb-3(b)(1), unless the authorization is terminated or revoked sooner.    Influenza A by PCR NEGATIVE NEGATIVE Final   Influenza B by PCR NEGATIVE NEGATIVE Final    Comment: (NOTE) The Xpert Xpress SARS-CoV-2/FLU/RSV assay is intended as an aid in  the diagnosis of influenza from Nasopharyngeal swab specimens and  should not be used as a sole basis for treatment. Nasal washings and  aspirates are unacceptable for Xpert Xpress SARS-CoV-2/FLU/RSV  testing. Fact Sheet for Patients: PinkCheek.be Fact Sheet for Healthcare Providers: GravelBags.it This test is not yet approved or cleared by the [place] FDA and  has been authorized for detection and/or diagnosis of SARS-CoV-2 by  FDA under an Emergency Use Authorization (EUA). This EUA will remain  in effect (meaning this test can be used) for the duration of the  Covid-19 declaration under Section 564(b)(1) of the Act, 21   U.S.C. section 360bbb-3(b)(1), unless the authorization is  terminated or revoked. Performed at [facility], [address] [place], [place] [address]   MRSA PCR Screening     Status: None   Collection Time: 02/18/20  4:10 AM   Specimen: Nasal Mucosa; Nasopharyngeal  Result Value Ref Range Status   MRSA by PCR NEGATIVE NEGATIVE Final    Comment:        The GeneXpert MRSA Assay (FDA approved for NASAL specimens only), is one component of a comprehensive MRSA colonization surveillance program. It is not intended to diagnose MRSA infection nor to guide or monitor treatment for MRSA infections. Performed at [facility], [address], [place], [place] [address]      Labs: BNP (last 3 results) Recent Labs    05/26/19 1226  BNP 88.8   Basic Metabolic Panel: Recent Labs  Lab 02/15/20 1041 02/17/20 1200 02/18/20 0814 02/19/20 0607  NA 138 137 140 141  K 3.9 4.2 4.1 4.0  CL 101 99 102 104  CO2 _0 GLUCOSE 166* 150* 118* 132*  BUN 12  _0 CREATININE 0.83 1.14 0.86 0.73  CALCIUM 8.8 8.0* 7.9* 7.9*  MG  --   --   --  1.8   Liver Function Tests: Recent Labs  Lab 02/15/20 1041 02/17/20 1200 02/17/20 1435 02/19/20 0607  AST 37  37 168* 134* 92*  ALT 32  32 134* 110* 96*  ALKPHOS 59  59 84 71 76  BILITOT 0.4  0.4 1.0 0.8 0.6  PROT 7.2  7.2 6.7 5.7* 5.9*  ALBUMIN 3.9  3.9 3.3* 2.8* 2.7*   No results for input(s): LIPASE, AMYLASE in the last 168 hours. No results for input(s): AMMONIA in the last 168 hours. CBC: Recent Labs  Lab 02/17/20 1200 02/18/20 0814 02/19/20 0607  WBC 4.0 3.4* 3.4*  NEUTROABS 3.3  --  2.1  HGB 12.0* 11.1* 10.6*  HCT 38.7* 35.5* 33.6*  MCV 99.7 99.4 98.8  PLT 206 198 192   Cardiac Enzymes: No results for input(s): CKTOTAL, CKMB, CKMBINDEX, TROPONINI in the last 168 hours. BNP: Invalid input(s): POCBNP CBG: No results for input(s): GLUCAP in the last 168 hours. D-Dimer No results for input(s): DDIMER in the  last 72 hours. Hgb A1c No results for input(s): HGBA1C in the last 72 hours. Lipid Profile No results for input(s): CHOL, HDL, LDLCALC, TRIG, CHOLHDL, LDLDIRECT in the last 72 hours. Thyroid function studies No results for input(s): TSH, T4TOTAL, T3FREE, THYROIDAB in the last 72 hours.  Invalid input(s): FREET3 Anemia work up No results for input(s): VITAMINB12, FOLATE, FERRITIN, TIBC, IRON, RETICCTPCT in the last 72 hours. Urinalysis    Component Value Date/Time   COLORURINE YELLOW 02/17/2020 1507   APPEARANCEUR CLEAR 02/17/2020 1507   LABSPEC 1.018 02/17/2020 1507   PHURINE 8.0 02/17/2020 1507   GLUCOSEU NEGATIVE 02/17/2020 1507   HGBUR NEGATIVE 02/17/2020 [place] 02/17/2020 1507   KETONESUR NEGATIVE 02/17/2020 1507   PROTEINUR NEGATIVE 02/17/2020 1507   NITRITE NEGATIVE 02/17/2020 1507   LEUKOCYTESUR NEGATIVE 02/17/2020 1507   Sepsis Labs Invalid input(s): PROCALCITONIN,  WBC,  LACTICIDVEN Microbiology Recent Results (from the past 240 hour(s))  Culture, blood (Routine x 2)     Status: None (Preliminary result)   Collection Time: 02/17/20 12:12 PM   Specimen: Left Antecubital; Blood  Result Value Ref Range Status   Specimen Description   Final    LEFT ANTECUBITAL BOTTLES DRAWN AEROBIC AND ANAEROBIC   Special Requests Blood Culture adequate volume  Final   Culture   Final    NO GROWTH 2 DAYS Performed at [facility], [address], [place], [place] [address]    Report Status PENDING  Incomplete  Culture, blood (Routine x 2)     Status: None (Preliminary result)   Collection Time: 02/17/20 12:13 PM   Specimen: BLOOD LEFT ARM  Result Value Ref Range Status   Specimen Description BLOOD LEFT ARM BOTTLES DRAWN AEROBIC AND ANAEROBIC  Final   Special Requests Blood Culture adequate volume  Final   Culture   Final    NO GROWTH 2 DAYS Performed at [facility], [address], [place], [place] [address]    Report Status PENDING  Incomplete   Respiratory Panel by RT PCR (Flu A&B, Covid) - Nasopharyngeal Swab     Status: None   Collection Time: 02/17/20 12:30 PM   Specimen: Nasopharyngeal Swab  Result Value Ref Range Status   SARS Coronavirus 2 by RT PCR NEGATIVE NEGATIVE Final    Comment: (NOTE) SARS-CoV-2 target nucleic acids are  NOT DETECTED. The SARS-CoV-2 RNA is generally detectable in upper respiratoy specimens during the acute phase of infection. The lowest concentration of SARS-CoV-2 viral copies this assay can detect is 131 copies/mL. A negative result does not preclude SARS-Cov-2 infection and should not be used as the sole basis for treatment or other patient management decisions. A negative result may occur with  improper specimen collection/handling, submission of specimen other than nasopharyngeal swab, presence of viral mutation(s) within the areas targeted by this assay, and inadequate number of viral copies (<131 copies/mL). A negative result must be combined with clinical observations, patient history, and epidemiological information. The expected result is Negative. Fact Sheet for Patients:  PinkCheek.be Fact Sheet for Healthcare Providers:  GravelBags.it This test is not yet ap proved or cleared by the [place] FDA and  has been authorized for detection and/or diagnosis of SARS-CoV-2 by FDA under an Emergency Use Authorization (EUA). This EUA will remain  in effect (meaning this test can be used) for the duration of the COVID-19 declaration under Section 564(b)(1) of the Act, 21 U.S.C. section 360bbb-3(b)(1), unless the authorization is terminated or revoked sooner.    Influenza A by PCR NEGATIVE NEGATIVE Final   Influenza B by PCR NEGATIVE NEGATIVE Final    Comment: (NOTE) The Xpert Xpress SARS-CoV-2/FLU/RSV assay is intended as an aid in  the diagnosis of influenza from Nasopharyngeal swab specimens and  should not be used as a sole  basis for treatment. Nasal washings and  aspirates are unacceptable for Xpert Xpress SARS-CoV-2/FLU/RSV  testing. Fact Sheet for Patients: PinkCheek.be Fact Sheet for Healthcare Providers: GravelBags.it This test is not yet approved or cleared by the [place] FDA and  has been authorized for detection and/or diagnosis of SARS-CoV-2 by  FDA under an Emergency Use Authorization (EUA). This EUA will remain  in effect (meaning this test can be used) for the duration of the  Covid-19 declaration under Section 564(b)(1) of the Act, 21  U.S.C. section 360bbb-3(b)(1), unless the authorization is  terminated or revoked. Performed at [facility], [address], [place], [place] [address]   MRSA PCR Screening     Status: None   Collection Time: 02/18/20  4:10 AM   Specimen: Nasal Mucosa; Nasopharyngeal  Result Value Ref Range Status   MRSA by PCR NEGATIVE NEGATIVE Final    Comment:        The GeneXpert MRSA Assay (FDA approved for NASAL specimens only), is one component of a comprehensive MRSA colonization surveillance program. It is not intended to diagnose MRSA infection nor to guide or monitor treatment for MRSA infections. Performed at [facility], [address] [place], [place] [address]    Time coordinating discharge:   SIGNED:  Irwin Brakeman, MD  [facility] 02/19/2020, 12:59 PM How to contact the [facility] Attending or Consulting provider [place] or covering provider during after hours [place], for this patient?  1. Check the care team in [facility] and look for a) attending/consulting [facility] provider listed and b) the [facility] team listed 2. Log into [email or web address] and use [place]'s universal password to access. If you do not have the password, please contact the hospital operator. 3. Locate the [facility] provider you are looking for under [facility] and page to a number that you can be directly reached. 4. If you  still have difficulty reaching the provider, please page the [facility] (Director on Call) for the Hospitalists listed on amion for assistance.

## 2020-02-19 NOTE — Progress Notes (Signed)
Discussed AVS including medications & need for follow up appointments with the patient and all questioned fully answered to the best of my ability. Patient taken with personal belongings to discharge area, where ride was waiting.

## 2020-02-20 LAB — LEGIONELLA PNEUMOPHILA SEROGP 1 UR AG: L. pneumophila Serogp 1 Ur Ag: NEGATIVE

## 2020-02-22 LAB — CULTURE, BLOOD (ROUTINE X 2)
Culture: NO GROWTH
Culture: NO GROWTH
Special Requests: ADEQUATE
Special Requests: ADEQUATE

## 2020-02-27 DIAGNOSIS — Z09 Encounter for follow-up examination after completed treatment for conditions other than malignant neoplasm: Secondary | ICD-10-CM | POA: Diagnosis not present

## 2020-02-28 NOTE — Progress Notes (Signed)
_0  ID: [name], male    DOB: [birth year], 74 y.o.   MRN: [id number]  Chief Complaint  Patient presents with   Hospitalization Follow-up    doing well since discharge    Referring provider: Merrilee Seashore, MD  HPI:  74 year old male former smoker followed in our office for interstitial lung disease  PMH: Hypertension, rheumatoid arthritis, aortic arthrosclerosis Smoker/ Smoking History: Former Smoker.  Quit 1986.  5 pack years. Maintenance: Ofev Pt of: Dr. Chase [name]  02/29/2020  - Visit   74 year old male former smoker followed in our office for interstitial lung disease, pulmonary fibrosis UIP on CT RA ILD, chronic respiratory failure.  Patient was last seen in our office on 02/15/2020.  At that time he was seen by Dr. Chase [name].  He was tolerating Ofev.  His ILD was felt to be stable.  He was encouraged to remain with rheumatology.  Spiriva was stopped.  Patient also was started on ILD prior research study.  Patient was encouraged to continue oxygen at night.  He was encouraged to follow back up with Dr. Chase [name] in 2 to 3 months with a spirometry with DLCO.  After being seen patient presented to the hospital on 02/17/2020.  Summary of discharge information listed below:  Admit date: 02/17/2020 Discharge date: 02/19/2020  Admitted From:  HOME  Disposition: HOME   Recommendations for Outpatient Follow-up:  1. Follow up with PCP in 1 weeks 2. Please follow up with pulmonologist in 1-2 weeks  Discharge Condition: STABLE  CODE STATUS: FULL  Brief Hospitalization Summary: Please see all hospital notes, images, labs for full details of the hospitalization. LarryScalesis a29 y.o.male,has medical history of hypertension, diabetes mellitus, pulmonary fibrosis, chronic respiratory failure on 4 L nasal cannula at bedtime, COPD, he presents to ED secondary for complaints of feeling bad, when he asked to specify, reports fatigue, patient reports dyspnea at  baseline, cough at baseline, he reports feeling feverish here in ED, he denies any fever at home, he denies any dysuria, polyuria, chest pain, abdominal pain, no nausea, vomiting or diarrhea, reports he received his second vaccine almost 10 days ago for COVID-19. - in [place] was febrile 101.2, with elevated LFTs, CTA chest negative for PE, as well no evidence of acute infectious process in the lung, his UA has been negative as well, given his immunocompromise with azathioprine and prednisone for rheumatoid arthritis and pulmonary fibrosis, he was started on broad-spectrum antibiotic and I was called to admit.  Pt was monitored under observation to be sure he remained afebrile for 24 hours.  He was given gentle hydration and supportive care.  He responded very well.  No fever.  Labs improved and feeling well to go home.    He will discharge on 5 days of oral doxycycline with close outpatient follow up.     Discharge Diagnoses:  Active Problems:   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)   HTN (hypertension)   Rheumatoid arthritis (HCC)   Hypothyroidism   Aortic atherosclerosis ([place])   Acute on chronic respiratory failure [facility])  Patient reports he is doing well since discharge.  He presents today off oxygen satting 98% on room air.  He reports that his Ofev was stopped while in the hospital.  Due to fever as well as elevated LFTs.  We will discuss this today.  Questionaires / Pulmonary Flowsheets:   MMRC: mMRC Dyspnea Scale mMRC Score  02/29/2020 0    Tests:   02/17/2020-CMP-AST 168, ALT 134  02/17/2020-hepatic function panel-AST 134, ALT 110, bilirubin 0.4  02/07/2020-CTA chest/abdomen-advanced interstitial fibrosis lung disease bilaterally, right significantly more advanced than left, no consolidation or pleural effusion, no acute findings in the chest, no acute findings within the abdomen or pelvis  12/26/2019-pulmonary function test-FVC 1.96 (61% predicted), ratio 86, DLCO 13.14  (58% predicted)  07/25/2015-CT chest high-res- appearance of the lungs remains compatible with ILD although the distribution of findings is somewhat unusual that overall pattern is most compatible with UIP presumably a manifestation of rheumatoid lung in this patient with a history of rheumatoid arthritis, new 6 x 7 pulmonary nodule right lower lobe  01/20/2016-CT chest without contrast- resolution of right lower lobe pulmonary nodule, no new pulmonary lesions or acute pulmonary findings, stable changes of ILD with fairly extensive honeycombing, stable mediastinal and hilar lymph nodes and collateral vasculature  04/09/2017-CT chest high-res- continued interval progression of fibrotic interstitial lung disease with extensive honeycombing, asymmetrically involving the right long with mild basilar predominance, diagnostic of UIP, stable mild reactive mediastinal lymphadenopathy  05/12/2019-CT chest high-res-no significant interval change in severe pulmonary fibrosis which is heterogeneously distributed most severe in the right lung base featuring extensive honeycombing and mild traction bronchiectasis, findings are not significantly changed compared to most recent examination in 2018, clearly progressed over time in comparison of 2013, coronary artery disease  05/26/2019-CT chest with contrast- no acute findings, changes of interstitial fibrosis stable from prior high-resolution CT chest on 05/12/2019, no evidence of pneumonia or pulmonary edema, right thyroid nodule which was biopsied on 12/06/2008 08/01/2018-pulmonary function test--spirometry with DLCO-FVC 2.09 (69% predicted), ratio 89, FEV1 1.87 (84% predicted), DLCO 52  10/06/2018-G6PD-14.4, normal  FENO:  No results found for: NITRICOXIDE  PFT: PFT Results Latest Ref Rng & Units 12/26/2019 08/01/2018 12/20/2017 09/27/2017 12/18/2016 07/29/2015 10/01/2014  FVC-Pre L 1.96 2.09 2.10 2.06 2.21 2.17 2.05  FVC-Predicted Pre % 61 69 65 64 68 66 62  FVC-Post L -  - - - 2.28 2.20 2.18  FVC-Predicted Post % - - - - 70 67 66  Pre FEV1/FVC % % 86 89 88 88 86 86 87  Post FEV1/FCV % % - - - - 88 89 87  FEV1-Pre L 1.69 1.87 1.84 1.81 1.92 1.88 1.78  FEV1-Predicted Pre % 72 84 77 75 80 77 72  FEV1-Post L - - - - 2.01 1.95 1.90  DLCO UNC% % 58 52 45 45 51 62 62  DLCO COR %Predicted % 88 95 80 78 90 119 105  TLC L - - - - - 3.91 3.67  TLC % Predicted % - - - - - 62 59  RV % Predicted % - - - - - 63 56    WALK:  SIX MIN WALK 02/29/2020 02/15/2020 01/04/2020 06/29/2019 04/26/2019 10/06/2018 08/01/2018  Medications - - - - - - -  Supplimental Oxygen during Test? (L/min) No No No Yes - No No  O2 Flow Rate - - - 4 - - -  Type - - - Pulse - - -  Laps - - - - - - -  Partial Lap (in Meters) - - - - - - -  Baseline BP (sitting) - - - - - - -  Baseline Heartrate - - - - - - -  Baseline Dyspnea (Borg Scale) - - - - - - -  Baseline Fatigue (Borg Scale) - - - - - - -  Baseline SPO2 - - - - - - -  BP (sitting) - - - - - - -  Heartrate - - - - - - -  Dyspnea (Borg Scale) - - - - - - -  Fatigue (Borg Scale) - - - - - - -  SPO2 - - - - - - -  BP (sitting) - - - - - - -  Heartrate - - - - - - -  SPO2 - - - - - - -  Stopped or Paused before Six Minutes - - - - - - -  Distance Completed - - - - - - -  Tech Comments: Patient was able to complete 2 laps at a steady pace. No O2 needed at time of walk. desat to 88% but after A MIN REST HE WENT BACK TO 94%, moderate walk  ta/cma Pt walked at an average pace completing one lap before dropping below 88%. Pt does have O2 that he wears prn. Pt had complaints of mild SOB. Patient maintained a steady walking pace, but had to turn oxygen on during 1st lap. Had to turn it up to 5L toward and of 2nd lap. Pt started walk w/heart rate at 95 and O2_0 . Did a slow to moderate pace walk.Pt tolarated well, by end of lap 3 HR _1  and O2 _2 . steady walk, no desat TA/CMA Pt walked at a normal pace completing all required laps denying any  complaints.  Provider Comments: - - - - - - -    Imaging: CT Angio Chest PE W and/or Wo Contrast  Result Date: 02/17/2020 CLINICAL DATA:  Cough. Decreased O2 sats. Pulmonary embolism suspected. Abdominal abscess/infection suspected. EXAM: CT ANGIOGRAPHY CHEST CT ABDOMEN AND PELVIS WITH CONTRAST TECHNIQUE: Multidetector CT imaging of the chest was performed using the standard protocol during bolus administration of intravenous contrast. Multiplanar CT image reconstructions and MIPs were obtained to evaluate the vascular anatomy. Multidetector CT imaging of the abdomen and pelvis was performed using the standard protocol during bolus administration of intravenous contrast. CONTRAST:  171m OMNIPAQUE IOHEXOL 350 MG/ML SOLN COMPARISON:  Chest CT dated 05/26/2019. FINDINGS: CTA CHEST FINDINGS Cardiovascular: No convincing pulmonary embolism identified within the main, lobar or segmental pulmonary arteries bilaterally. Flow artifact is present at the junction of the RIGHT lower lobe pulmonary artery branches. There is diminution of contrast passive occasion within the more peripheral aspects of the RIGHT lower lobe suggesting vascular shunting. No thoracic aortic aneurysm or evidence of aortic dissection. No pericardial effusion. Mediastinum/Nodes: Hypertrophy of the RIGHT thyroid lobe, chronic. No mass or enlarged lymph nodes are seen within the mediastinum or perihilar regions. Esophagus is unremarkable. Trachea and central bronchi are unremarkable. Lungs/Pleura: Advanced interstitial fibrosis bilaterally, RIGHT significantly more advanced than the LEFT. No consolidation or pleural effusion. No pneumothorax. Musculoskeletal: No acute or suspicious osseous finding. Review of the MIP images confirms the above findings. CT ABDOMEN and PELVIS FINDINGS Hepatobiliary: No focal liver abnormality is seen. Small gallstone within the fundus of the gallbladder. Gallbladder appears otherwise normal. No bile duct dilatation  seen. Pancreas: Partially infiltrated with fat but otherwise unremarkable. Spleen: Normal in size without focal abnormality. Adrenals/Urinary Tract: Adrenal glands appear normal. Bilateral renal cysts. Kidneys are otherwise unremarkable without suspicious mass, stone or hydronephrosis. No ureteral or bladder calculi identified. Bladder appears normal. Stomach/Bowel: No dilated large or small bowel loops. No evidence of bowel wall inflammation. Scattered diverticulosis but no focal inflammatory change to suggest acute diverticulitis. Appendix is normal. Stomach is unremarkable, partially decompressed. Vascular/Lymphatic: No significant vascular findings are present. No enlarged abdominal or pelvic lymph nodes. Reproductive: Prostate is  unremarkable. Other: Bilateral inguinal hernias which contain fat only. No free fluid or abscess collection within the abdomen or pelvis. No free intraperitoneal air. Musculoskeletal: No acute or suspicious osseous finding. Degenerative spondylosis of the lumbar spine, mild to moderate in degree. Review of the MIP images confirms the above findings. IMPRESSION: 1. Advanced interstitial fibrosis lung disease bilaterally, RIGHT significantly more advanced than the LEFT. No consolidation or pleural effusion. 2. No acute findings within the chest. No pulmonary embolism. No pneumonia or pleural effusion. 3. No acute findings within the abdomen or pelvis. No bowel obstruction or evidence of bowel wall inflammation. No evidence of acute solid organ abnormality. No free fluid or abscess collection. No renal or ureteral calculi. Appendix is normal. 4. Colonic diverticulosis without evidence of acute diverticulitis. Electronically Signed   By: Franki Cabot [name]   On: 02/17/2020 14:57   CT ABDOMEN PELVIS W CONTRAST  Result Date: 02/17/2020 CLINICAL DATA:  Cough. Decreased O2 sats. Pulmonary embolism suspected. Abdominal abscess/infection suspected. EXAM: CT ANGIOGRAPHY CHEST CT ABDOMEN AND  PELVIS WITH CONTRAST TECHNIQUE: Multidetector CT imaging of the chest was performed using the standard protocol during bolus administration of intravenous contrast. Multiplanar CT image reconstructions and MIPs were obtained to evaluate the vascular anatomy. Multidetector CT imaging of the abdomen and pelvis was performed using the standard protocol during bolus administration of intravenous contrast. CONTRAST:  158m OMNIPAQUE IOHEXOL 350 MG/ML SOLN COMPARISON:  Chest CT dated 05/26/2019. FINDINGS: CTA CHEST FINDINGS Cardiovascular: No convincing pulmonary embolism identified within the main, lobar or segmental pulmonary arteries bilaterally. Flow artifact is present at the junction of the RIGHT lower lobe pulmonary artery branches. There is diminution of contrast passive occasion within the more peripheral aspects of the RIGHT lower lobe suggesting vascular shunting. No thoracic aortic aneurysm or evidence of aortic dissection. No pericardial effusion. Mediastinum/Nodes: Hypertrophy of the RIGHT thyroid lobe, chronic. No mass or enlarged lymph nodes are seen within the mediastinum or perihilar regions. Esophagus is unremarkable. Trachea and central bronchi are unremarkable. Lungs/Pleura: Advanced interstitial fibrosis bilaterally, RIGHT significantly more advanced than the LEFT. No consolidation or pleural effusion. No pneumothorax. Musculoskeletal: No acute or suspicious osseous finding. Review of the MIP images confirms the above findings. CT ABDOMEN and PELVIS FINDINGS Hepatobiliary: No focal liver abnormality is seen. Small gallstone within the fundus of the gallbladder. Gallbladder appears otherwise normal. No bile duct dilatation seen. Pancreas: Partially infiltrated with fat but otherwise unremarkable. Spleen: Normal in size without focal abnormality. Adrenals/Urinary Tract: Adrenal glands appear normal. Bilateral renal cysts. Kidneys are otherwise unremarkable without suspicious mass, stone or  hydronephrosis. No ureteral or bladder calculi identified. Bladder appears normal. Stomach/Bowel: No dilated large or small bowel loops. No evidence of bowel wall inflammation. Scattered diverticulosis but no focal inflammatory change to suggest acute diverticulitis. Appendix is normal. Stomach is unremarkable, partially decompressed. Vascular/Lymphatic: No significant vascular findings are present. No enlarged abdominal or pelvic lymph nodes. Reproductive: Prostate is unremarkable. Other: Bilateral inguinal hernias which contain fat only. No free fluid or abscess collection within the abdomen or pelvis. No free intraperitoneal air. Musculoskeletal: No acute or suspicious osseous finding. Degenerative spondylosis of the lumbar spine, mild to moderate in degree. Review of the MIP images confirms the above findings. IMPRESSION: 1. Advanced interstitial fibrosis lung disease bilaterally, RIGHT significantly more advanced than the LEFT. No consolidation or pleural effusion. 2. No acute findings within the chest. No pulmonary embolism. No pneumonia or pleural effusion. 3. No acute findings within the abdomen or pelvis. No bowel  obstruction or evidence of bowel wall inflammation. No evidence of acute solid organ abnormality. No free fluid or abscess collection. No renal or ureteral calculi. Appendix is normal. 4. Colonic diverticulosis without evidence of acute diverticulitis. Electronically Signed   By: Franki Cabot [name]   On: 02/17/2020 14:57   DG Chest Portable 1 View  Result Date: 02/17/2020 CLINICAL DATA:  Dyspnea, cough, fever EXAM: PORTABLE CHEST 1 VIEW COMPARISON:  05/26/2019 chest radiograph. FINDINGS: Stable cardiomediastinal silhouette with normal heart size. No pneumothorax. No pleural effusion. Extensive coarse reticular opacities throughout the right greater than left lungs, similar. No acute superimposed consolidative airspace disease. IMPRESSION: Chronic advanced fibrotic interstitial lung disease  involving the right greater than left lungs, with no acute superimposed cardiopulmonary disease. Electronically Signed   By: Ilona Sorrel [name]   On: 02/17/2020 12:35    Lab Results:  CBC    Component Value Date/Time   WBC 3.4 (L) 02/19/2020 0607   RBC 3.40 (L) 02/19/2020 0607   HGB 10.6 (L) 02/19/2020 0607   HCT 33.6 (L) 02/19/2020 0607   PLT 192 02/19/2020 0607   MCV 98.8 02/19/2020 0607   MCH 31.2 02/19/2020 0607   MCHC 31.5 02/19/2020 0607   RDW 16.4 (H) 02/19/2020 0607   LYMPHSABS 0.5 (L) 02/19/2020 0607   MONOABS 0.3 02/19/2020 0607   EOSABS 0.4 02/19/2020 0607   BASOSABS 0.0 02/19/2020 0607    BMET    Component Value Date/Time   NA 141 02/19/2020 0607   K 4.0 02/19/2020 0607   CL 104 02/19/2020 0607   CO2 29 02/19/2020 0607   GLUCOSE 132 (H) 02/19/2020 0607   BUN 9 02/19/2020 0607   CREATININE 0.73 02/19/2020 0607   CREATININE 1.21 (H) 10/17/2018 1641   CALCIUM 7.9 (L) 02/19/2020 0607   GFRNONAA >60 02/19/2020 0607   GFRAA >60 02/19/2020 0607    BNP    Component Value Date/Time   BNP 43.6 05/26/2019 1226    ProBNP    Component Value Date/Time   PROBNP 103.7 12/15/2011 1855    Specialty Problems      Pulmonary Problems   Pneumonia   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)    07/25/2015-CT chest high-res- appearance of the lungs remains compatible with ILD although the distribution of findings is somewhat unusual that overall pattern is most compatible with UIP presumably a manifestation of rheumatoid lung in this patient with a history of rheumatoid arthritis, new 6 x 7 pulmonary nodule right lower lobe  01/20/2016-CT chest without contrast- resolution of right lower lobe pulmonary nodule, no new pulmonary lesions or acute pulmonary findings, stable changes of ILD with fairly extensive honeycombing, stable mediastinal and hilar lymph nodes and collateral vasculature  04/09/2017-CT chest high-res- continued interval progression of fibrotic  interstitial lung disease with extensive honeycombing, asymmetrically involving the right long with mild basilar predominance, diagnostic of UIP, stable mild reactive mediastinal lymphadenopathy  05/12/2019-CT chest high-res-no significant interval change in severe pulmonary fibrosis which is heterogeneously distributed most severe in the right lung base featuring extensive honeycombing and mild traction bronchiectasis, findings are not significantly changed compared to most recent examination in 2018, clearly progressed over time in comparison of 2013, coronary artery disease  05/26/2019-CT chest with contrast- no acute findings, changes of interstitial fibrosis stable from prior high-resolution CT chest on 05/12/2019, no evidence of pneumonia or pulmonary edema, right thyroid nodule which was biopsied on 12/06/2008   08/01/2018-pulmonary function test--spirometry with DLCO-FVC 2.09 (69% predicted), ratio  89, FEV1 1.87 (84% predicted), DLCO 52       Chronic cough   Nodule of right lung   Wheezing on auscultation   Nocturnal hypoxemia   Chronic respiratory failure with hypoxia (HCC)   Shortness of breath   Acute on chronic respiratory failure (HCC)      Allergies  Allergen Reactions   Bactrim [Sulfamethoxazole-Trimethoprim]     Patient states "got short winded"    Immunization History  Administered Date(s) Administered   Influenza Split 08/03/2011, 07/03/2012, 09/02/2013, 07/03/2014   Influenza, High Dose Seasonal PF 08/02/2016, 08/02/2017, 07/25/2018, 07/04/2019   Influenza,inj,Quad PF,6+ Mos 07/29/2015   PFIZER SARS-COV-2 Vaccination 01/13/2020, 02/05/2020   Pneumococcal Conjugate-13 08/01/2018   Pneumococcal Polysaccharide-23 11/03/2007   Zoster Recombinat (Shingrix) 04/11/2019    Past Medical History:  Diagnosis Date   Arthritis    COPD (chronic obstructive pulmonary disease) ([place])    Diabetes mellitus    Hypertension    Pneumonia    Thyroid disease      Tobacco History: Social History   Tobacco Use  Smoking Status Former Smoker   Packs/day: 0.50   Years: 10.00   Pack years: 5.00   Types: Cigarettes   Quit date: 12/03/1984   Years since quitting: 35.2  Smokeless Tobacco Never Used  Tobacco Comment   Quit 40 years ago.    Counseling given: Not Answered Comment: Quit 40 years ago.    Continue to not smoke  Outpatient Encounter Medications as of 02/29/2020  Medication Sig   amLODipine (NORVASC) 10 MG tablet Take 10 mg by mouth daily.   aspirin 81 MG tablet Take 81 mg by mouth daily.   atorvastatin (LIPITOR) 20 MG tablet Take 20 mg by mouth daily.   azaTHIOprine (IMURAN) 50 MG tablet Take 100 mg by mouth daily. Take two tablets by mouth in the morning   levothyroxine (SYNTHROID, LEVOTHROID) 25 MCG tablet Take 25 mcg by mouth daily.   metFORMIN (GLUCOPHAGE-XR) 500 MG 24 hr tablet Take 2 tablets by mouth 2 (two) times daily.    pantoprazole (PROTONIX) 40 MG tablet Take 40 mg by mouth daily.   predniSONE (DELTASONE) 1 MG tablet Take 1 mg by mouth in the morning, at noon, and at bedtime.    Tiotropium Bromide Monohydrate (SPIRIVA RESPIMAT) 2.5 MCG/ACT AERS Inhale 2 puffs into the lungs daily.   No facility-administered encounter medications on file as of 02/29/2020.     Review of Systems  Review of Systems  Constitutional: Positive for fatigue. Negative for activity change, chills, fever and unexpected weight change.  HENT: Negative for postnasal drip, rhinorrhea, sinus pressure, sinus pain and sore throat.   Eyes: Negative.   Respiratory: Positive for cough and shortness of breath. Negative for wheezing.   Cardiovascular: Negative for chest pain and palpitations.  Gastrointestinal: Negative for constipation, diarrhea, nausea and vomiting.  Endocrine: Negative.   Genitourinary: Negative.   Musculoskeletal: Negative.   Skin: Negative.   Neurological: Negative for dizziness and headaches.   Psychiatric/Behavioral: Negative.  Negative for dysphoric mood. The patient is not nervous/anxious.   All other systems reviewed and are negative.    Physical Exam  BP 120/68 (BP Location: Left Arm, Patient Position: Sitting, Cuff Size: Normal)    Pulse 92    Temp 98.4 F (36.9 C) (Temporal)    Ht _0  (1.676 m)    Wt 178 lb (80.7 kg)    SpO2 98% Comment: on RA   BMI 28.73 kg/m   Wt Readings from Last  5 Encounters:  02/29/20 178 lb (80.7 kg)  02/17/20 185 lb (83.9 kg)  02/15/20 185 lb 9.6 oz (84.2 kg)  01/04/20 185 lb 6.4 oz (84.1 kg)  08/02/19 185 lb (83.9 kg)    BMI Readings from Last 5 Encounters:  02/29/20 28.73 kg/m  02/17/20 29.86 kg/m  02/15/20 29.96 kg/m  01/04/20 29.92 kg/m  08/02/19 29.86 kg/m     Physical Exam Vitals and nursing note reviewed.  Constitutional:      General: [name] not in acute distress.    Appearance: Normal appearance. [name] normal weight.  HENT:     Head: Normocephalic and atraumatic.     Right Ear: Hearing and external ear normal.     Left Ear: Hearing and external ear normal.     Nose: Nose normal. No mucosal edema or rhinorrhea.     Right Turbinates: Not enlarged.     Left Turbinates: Not enlarged.     Mouth/Throat:     Mouth: Mucous membranes are dry.     Pharynx: Oropharynx is clear. No oropharyngeal exudate.  Eyes:     Pupils: Pupils are equal, round, and reactive to light.  Cardiovascular:     Rate and Rhythm: Normal rate and regular rhythm.     Pulses: Normal pulses.     Heart sounds: Normal heart sounds. No murmur.  Pulmonary:     Effort: Pulmonary effort is normal.     Breath sounds: Rhonchi and rales present. No decreased breath sounds or wheezing.     Comments: Inspiratory cough, noisy chest, crackles bilaterally right side greater than left Abdominal:     General: Bowel sounds are normal. There is no distension.     Palpations: Abdomen is soft.     Tenderness: There is no abdominal tenderness.  Musculoskeletal:      Cervical back: Normal range of motion.     Right lower leg: No edema.     Left lower leg: No edema.  Lymphadenopathy:     Cervical: No cervical adenopathy.  Skin:    General: Skin is warm and dry.     Capillary Refill: Capillary refill takes less than 2 seconds.     Findings: No erythema or rash.  Neurological:     General: No focal deficit present.     Mental Status: [name] alert and oriented to person, place, and time.     Motor: No weakness.     Coordination: Coordination normal.     Gait: Gait is intact. Gait normal.  Psychiatric:        Mood and Affect: Mood normal.        Behavior: Behavior normal. Behavior is cooperative.        Thought Content: Thought content normal.        Judgment: Judgment normal.       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 Chase [name]  Therapeutic drug monitoring Elevated LFTs when hospitalized We will repeat lab work today May need to consider resuming Ofev based off that lab work    Return in about 2 months (around 04/30/2020), or if symptoms worsen or fail to improve, for Follow up with Dr. Purnell Shoemaker, ILD clinic - 3mn  slot.   BLauraine Rinne NP 02/29/2020   This appointment required 34 minutes of patient care (this includes precharting, chart review, review of results, face-to-face care, etc.).

## 2020-02-29 ENCOUNTER — Inpatient Hospital Stay: Payer: Medicare Other | Admitting: Pulmonary Disease

## 2020-02-29 ENCOUNTER — Other Ambulatory Visit: Payer: Self-pay

## 2020-02-29 ENCOUNTER — Ambulatory Visit: Payer: Medicare Other | Admitting: Pulmonary Disease

## 2020-02-29 ENCOUNTER — Other Ambulatory Visit (INDEPENDENT_AMBULATORY_CARE_PROVIDER_SITE_OTHER): Payer: Medicare Other

## 2020-02-29 ENCOUNTER — Encounter: Payer: Self-pay | Admitting: Pulmonary Disease

## 2020-02-29 VITALS — BP 120/68 | HR 92 | Temp 98.4°F | Ht 66.0 in | Wt 178.0 lb

## 2020-02-29 DIAGNOSIS — J9611 Chronic respiratory failure with hypoxia: Secondary | ICD-10-CM | POA: Diagnosis not present

## 2020-02-29 DIAGNOSIS — R05 Cough: Secondary | ICD-10-CM | POA: Diagnosis not present

## 2020-02-29 DIAGNOSIS — M069 Rheumatoid arthritis, unspecified: Secondary | ICD-10-CM

## 2020-02-29 DIAGNOSIS — J8410 Pulmonary fibrosis, unspecified: Secondary | ICD-10-CM | POA: Diagnosis not present

## 2020-02-29 DIAGNOSIS — G4734 Idiopathic sleep related nonobstructive alveolar hypoventilation: Secondary | ICD-10-CM

## 2020-02-29 DIAGNOSIS — Z5181 Encounter for therapeutic drug level monitoring: Secondary | ICD-10-CM

## 2020-02-29 DIAGNOSIS — R053 Chronic cough: Secondary | ICD-10-CM

## 2020-02-29 LAB — HEPATIC FUNCTION PANEL
ALT: 32 U/L (ref 0–53)
AST: 26 U/L (ref 0–37)
Albumin: 4 g/dL (ref 3.5–5.2)
Alkaline Phosphatase: 104 U/L (ref 39–117)
Bilirubin, Direct: 0.2 mg/dL (ref 0.0–0.3)
Total Bilirubin: 0.6 mg/dL (ref 0.2–1.2)
Total Protein: 7.4 g/dL (ref 6.0–8.3)

## 2020-02-29 NOTE — Assessment & Plan Note (Signed)
Plan: Continue 2 L of O2 at night

## 2020-02-29 NOTE — Assessment & Plan Note (Addendum)
Stable Likely directly related to known interstitial lung disease Continue over-the-counter cough meds Can use Hycodan cough syrup as needed nightly as previously discussed by Dr. Chase Caller

## 2020-02-29 NOTE — Assessment & Plan Note (Signed)
Plan: Continue follow-up with rheumatology Continue Imuran, Rituxan infusions every 6 months, prednisone 3 mg daily Lab work today May resume Ofev based off lab work Continue nocturnal oxygen

## 2020-02-29 NOTE — Patient Instructions (Addendum)
You were seen today by Lauraine Rinne, NP  for:   1. 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 Chronic respiratory failure with hypoxia (HCC) 3. Nocturnal hypoxemia 4. Therapeutic drug monitoring 5. Chronic cough 6. Rheumatoid arthritis, involving unspecified site, unspecified whether rheumatoid factor present ([place])  Walk today in office  Labs today: [facility]  After labs we will consider resuming Ofev 150 mg twice daily, wait for our call on your results   We will schedule an appointment with the clinical pharmacy team in 3 to 4 weeks to see how you are doing on the Ofev  Keep follow-up with rheumatology  Maintain oxygen saturations above 88 to 90%  Okay to return to work Work note provided today  Keep breathing test appointment in June/2021   Follow Up:    Return in about 2 months (around 04/30/2020), or if symptoms worsen or fail to improve, for Follow up with Dr. Purnell Shoemaker, ILD clinic - 35mn slot.  Please present to our office in 2-3 weeks for an appointment with the clinical pharmacy team for:  .[name] KitchenMedication Management - OFEV start . Medication reconciliation     Please do your part to reduce the spread of COVID-19:      Reduce your risk of any infection  and COVID19 by using the similar precautions used for avoiding the common cold or flu:  .[name] KitchenWash your hands often with soap and warm water for at least 20 seconds.  If soap and water are not readily available, use an alcohol-based hand sanitizer with at least 60% alcohol.  . If coughing or sneezing, cover your mouth and nose by coughing or sneezing into the elbow areas of your shirt or coat, into a tissue or into your sleeve (not your hands). .Langley GaussA MASK when in public  . Avoid shaking hands with others and consider head nods or verbal greetings only. . Avoid touching your eyes, nose, or mouth with unwashed hands.  . Avoid close contact with people who are sick. . Avoid places or events with large  numbers of people in one location, like concerts or sporting events. . If you have some symptoms but not all symptoms, continue to monitor at home and seek medical attention if your symptoms worsen. . If you are having a medical emergency, call 911.   [place]/ e-Visit: [email or web address]        [facility]: [facility] [phone]                  [facility] [phone]    It is flu season:   >>> Best ways to protect herself from the flu: Receive the yearly flu vaccine, practice good hand hygiene washing with soap and also using hand sanitizer when available, eat a nutritious meals, get adequate rest, hydrate appropriately   Please contact the office if your symptoms worsen or you have concerns that you are not improving.   Thank you for choosing [facility] for your healthcare, and for allowing [facility] partner with you on your healthcare journey. I am thankful to be able to provide care to you today.   BWyn QuakerFNP-C

## 2020-02-29 NOTE — Assessment & Plan Note (Signed)
Plan: Continue follow-up with rheumatology Continue forward with planned Rituxan infusions Continue Imuran Continue prednisone

## 2020-02-29 NOTE — Assessment & Plan Note (Signed)
Elevated LFTs when hospitalized We will repeat lab work today May need to consider resuming Ofev based off that lab work

## 2020-02-29 NOTE — Assessment & Plan Note (Signed)
Plan: Continue oxygen therapy as prescribed Walk today in office did not require oxygen on room air Continue nocturnal oxygen use

## 2020-03-01 ENCOUNTER — Other Ambulatory Visit: Payer: Self-pay | Admitting: Pharmacist

## 2020-03-01 NOTE — Progress Notes (Signed)
Patient identification verified. Recent lab work reviewed.   Per Steven Quaker FNP, lab work results have come back, showing stability. These have improved since they were last completed 10 days ago.  Okay to restart Ofev twice daily. Patient should have a pharmacy team appointment in 3 to 4 weeks with our office. Patient likely will benefit from lab work at that time as well.  Patient is not interested in a pharmacy appointment at this time. Plans to discuss and next office visit.

## 2020-03-01 NOTE — Patient Outreach (Signed)
[facility])  [place] Team    [facility] pharmacy case will be closed as our team is transitioning from the [place] Management Department into the [facility] Quality Department and will no longer be using [facility] for documentation purposes.   [facility] pharmacy technician will continue to assist patient with medication assistance program applications until complete.     Ralene Bathe, PharmD, [place] [phone]

## 2020-03-02 DIAGNOSIS — J129 Viral pneumonia, unspecified: Secondary | ICD-10-CM | POA: Diagnosis not present

## 2020-03-02 DIAGNOSIS — R0902 Hypoxemia: Secondary | ICD-10-CM | POA: Diagnosis not present

## 2020-03-13 ENCOUNTER — Telehealth: Payer: Self-pay | Admitting: Internal Medicine

## 2020-03-13 NOTE — Telephone Encounter (Signed)
Spoke with patient. He stated that he did try to return back to work after seeing Aaron Edelman on 02/29/20. He was not able to do so.   [name] now requesting a letter to be written out of work for good this time.   [name] currently scheduled to see MR on 05/01/2020 and stated that he will need a note before then.   Aaron Edelman, since you were the last to see the patient and the last to provide him with a note to return back to work, would you be ok with witting a 2nd letter? Please advise. Thanks.

## 2020-03-14 ENCOUNTER — Telehealth: Payer: Self-pay | Admitting: Internal Medicine

## 2020-03-14 NOTE — Telephone Encounter (Signed)
Patient is checking on work note. Patient phone number is [phone].

## 2020-03-14 NOTE — Telephone Encounter (Signed)
pls arrange a telephone visit with an app to get precise details of request and do the letter. APP can do this over telephone visit. Will be based on progressive pulmonary fibrosis and RA and oxygen dependency  Thanks    SIGNATURE    Dr. Brand Males, [name], [name],  Pulmonary and Critical Care Medicine Staff Physician, [place] Director - Interstitial Lung Disease  Program  Pulmonary [place] at [place], [place], [address]  Pager: [phone], If no answer or between  15:00h - 7:00h: call [phone] Telephone: [phone]  5:35 PM 03/14/2020

## 2020-03-14 NOTE — Telephone Encounter (Signed)
I spoke with the pt and notified of response per [name]  He verbalized understanding  Will call [place]  Already on Disability

## 2020-03-14 NOTE — Telephone Encounter (Signed)
If patient wants to be written out of work for good then he needs to discuss with his HR and likely apply for disability. These forms do no go through our office but go through:   [facility] [address] [place], [place] Telephone [phone]  Please also route this message to Dr. Chase Caller as he will see the patient next.   Wyn Quaker FNP

## 2020-03-14 NOTE — Telephone Encounter (Signed)
Spoke with pt, who is requesting a note to be written out of work permanently.  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  I do not see where disability has been discussed in previous office visits.  In fact, on 4/29 after pt's OV, we wrote a note clearing him to return to work.  Pt wishes to pick up note from office.    MR please advise if you ar ok with writing such letter, and if so what the letter should include.  Thanks.

## 2020-03-15 NOTE — Telephone Encounter (Signed)
Called and spoke with pt about the info stated by MR. Pt stated that he is going to be making today his last day on the job so at this time due to that a letter is not needed. Nothing further needed.

## 2020-03-21 ENCOUNTER — Telehealth: Payer: Self-pay | Admitting: Internal Medicine

## 2020-03-21 NOTE — Telephone Encounter (Signed)
Spoke with the pt  He states his stomach has been "upset" and he thinks it's the ofev  [name] taking 1 tab twice daily and the majority of the time he does take with food   He states that he has no loose stools, diarrhea, constipation, nausea, vomiting He states "my stomach just feels upset all the time"- pain in the middle of his abdomen x 1 wk  Please advise, thanks

## 2020-03-22 NOTE — Telephone Encounter (Signed)
Spoke with pt. [name] aware of Dr. Golden Pop recommendations. Televisit has been scheduled with Aaron Edelman on 03/29/2020 at 1000. Nothing further was needed.

## 2020-03-22 NOTE — Telephone Encounter (Signed)
Stop ofev immediately  Plan  - go to ER if worse/persists/any concerns - tele visit with me or an app in 7 days to review - will need to make decision on long term restart v dc of ofev based on above  Thanks    SIGNATURE    Dr. Brand Males, [name], [name],  Pulmonary and Critical Care Medicine Staff Physician, [place] Director - Interstitial Lung Disease  Program  Pulmonary [place] at [place], [place], [address]  Pager: [phone], If no answer or between  15:00h - 7:00h: call [phone] Telephone: [phone]  5:21 AM 03/22/2020

## 2020-03-28 ENCOUNTER — Encounter: Payer: Self-pay | Admitting: Adult Health

## 2020-03-28 ENCOUNTER — Other Ambulatory Visit: Payer: Self-pay

## 2020-03-28 ENCOUNTER — Telehealth: Payer: Self-pay | Admitting: Adult Health

## 2020-03-28 ENCOUNTER — Ambulatory Visit (INDEPENDENT_AMBULATORY_CARE_PROVIDER_SITE_OTHER): Payer: Medicare Other | Admitting: Adult Health

## 2020-03-28 DIAGNOSIS — J8410 Pulmonary fibrosis, unspecified: Secondary | ICD-10-CM

## 2020-03-28 DIAGNOSIS — T50905A Adverse effect of unspecified drugs, medicaments and biological substances, initial encounter: Secondary | ICD-10-CM

## 2020-03-28 DIAGNOSIS — K30 Functional dyspepsia: Secondary | ICD-10-CM

## 2020-03-28 NOTE — Telephone Encounter (Signed)
Patient was scheduled for a telemedicine visit on Mar 28, 2020 at 2 PM.  Patient was contacted via telephone x3 with no answer.  A voicemail was left with no return call.  Please reach out to patient to reschedule at a another  date and time .

## 2020-03-28 NOTE — Progress Notes (Signed)
Virtual Visit via Telephone Note  I connected with [name] on 03/28/20 at  2:00 PM EDT by telephone and verified that I am speaking with the correct person using two identifiers.  Location: Patient: Home  Provider: Home Office    I discussed the limitations, risks, security and privacy concerns of performing an evaluation and management service by telephone and the availability of in person appointments. I also discussed with the patient that there may be a patient responsible charge related to this service. The patient expressed understanding and agreed to proceed.   History of Present Illness: 74 year old male former smoker followed for rheumatoid arthritis associated interstitial lung disease.and chronic respiratory failure on oxygen Medical history significant for rheumatoid arthritis  Today's televisit is a follow-up visit.  Patient is maintained on Ofev twice daily recently had increased abdominal upset.  With abdominal pains nausea and decreased appetite.  His Ofev was held.  Since holding Ofev over the last few days patient is feeling has not seen any improvement in symptoms he continues to have stomach upset and low appetite.  He denies any diarrhea, bloody stools, fever.  Says he just does not have any taste or appetite for anything.  He denies any urinary symptoms or back pain. He has not started any new medicines.  He has been off of Ofev for 1 week without any significant change in symptoms.  Patient was admitted last month.  Liver function testing was elevated however at discharge they returned back to normal. Patient says breathing is doing okay.  Denies any flare of cough or wheezing.  Remains on oxygen at 4 L.  Gets short of breath with heavy activities.  He remains on Imuran and prednisone.   Observations/Objective: Speaks in full sentences with no audible distress or wheezing.  02/17/2020-CMP-AST 168, ALT 134 02/17/2020-hepatic function panel-AST 134, ALT 110, bilirubin  0.4  02/07/2020-CTA chest/abdomen-advanced interstitial fibrosis lung disease bilaterally, right significantly more advanced than left, no consolidation or pleural effusion, no acute findings in the chest, no acute findings within the abdomen or pelvis  12/26/2019-pulmonary function test-FVC 1.96 (61% predicted), ratio 86, DLCO 13.14 (58% predicted)  07/25/2015-CT chest high-res- appearance of the lungs remains compatible with ILD although the distribution of findings is somewhat unusual that overall pattern is most compatible with UIP presumably a manifestation of rheumatoid lung in this patient with a history of rheumatoid arthritis, new 6 x 7 pulmonary nodule right lower lobe  01/20/2016-CT chest without contrast- resolution of right lower lobe pulmonary nodule, no new pulmonary lesions or acute pulmonary findings, stable changes of ILD with fairly extensive honeycombing, stable mediastinal and hilar lymph nodes and collateral vasculature  04/09/2017-CT chest high-res- continued interval progression of fibrotic interstitial lung disease with extensive honeycombing, asymmetrically involving the right long with mild basilar predominance, diagnostic of UIP, stable mild reactive mediastinal lymphadenopathy  05/12/2019-CT chest high-res-no significant interval change in severe pulmonary fibrosis which is heterogeneously distributed most severe in the right lung base featuring extensive honeycombing and mild traction bronchiectasis, findings are not significantly changed compared to most recent examination in 2018, clearly progressed over time in comparison of 2013, coronary artery disease  05/26/2019-CT chest with contrast- no acute findings, changes of interstitial fibrosis stable from prior high-resolution CT chest on 05/12/2019, no evidence of pneumonia or pulmonary edema, right thyroid nodule which was biopsied on 12/06/2008 08/01/2018-pulmonary function test--spirometry with DLCO-FVC 2.09 (69%  predicted), ratio 89, FEV1 1.87 (84% predicted), DLCO 52  10/06/2018-G6PD-14.4, normal  Assessment and Plan: Abdominal  discomfort, anorexia questionable etiology.  Symptoms did not improve off of Ofev x1 week.  Feel that patient needs further assessment for other etiology of abdominal discomfort.  Have recommended he follow-up with his primary care physician for further evaluation and possible treatment options.  Have did recommend a bland diet.  For now remain off of opiate.  He has a follow-up visit here in 2 weeks and we will reevaluate at that time.  Rheumatoid arthritis associated interstitial lung disease.  Continue on current regimen.  Ofev is on hold as above due to GI upset  Chronic hypoxic respiratory failure.  Patient is continue on oxygen at 4 L O2 saturation goal was to be greater than 88 to 90%.  Follow Up Instructions:   Follow-up in 2 weeks as planned and as needed  I discussed the assessment and treatment plan with the patient. The patient was provided an opportunity to ask questions and all were answered. The patient agreed with the plan and demonstrated an understanding of the instructions.   The patient was advised to call back or seek an in-person evaluation if the symptoms worsen or if the condition fails to improve as anticipated.  I provided 22  minutes of non-face-to-face time during this encounter.   Rexene Edison, NP

## 2020-03-28 NOTE — Telephone Encounter (Signed)
Called and spoke with patient about missed appt for today. Patient thought it was tomorrow not today. He stated that he didn't want to reschedule at this time and that he wanted to wait until next month to have his appt with MR. Expressed to patient that he can wait if he wants and if anything changes or he needs anything to please call the office to get an appointment. Patient expressed understanding. Nothing further needed at this time.

## 2020-03-28 NOTE — Patient Instructions (Signed)
Remain off [place] for now .  Bland diet as tolerated Please make an appointment with your primary care doctor to evaluate your abdominal discomfort Continue on oxygen 4 L Follow-up in 2 weeks as planned and as needed Please contact office for sooner follow up if symptoms do not improve or worsen or seek emergency care

## 2020-03-29 ENCOUNTER — Ambulatory Visit: Payer: Medicare Other | Admitting: Pulmonary Disease

## 2020-04-02 DIAGNOSIS — J129 Viral pneumonia, unspecified: Secondary | ICD-10-CM | POA: Diagnosis not present

## 2020-04-02 DIAGNOSIS — R0902 Hypoxemia: Secondary | ICD-10-CM | POA: Diagnosis not present

## 2020-04-03 DIAGNOSIS — Z79899 Other long term (current) drug therapy: Secondary | ICD-10-CM | POA: Diagnosis not present

## 2020-04-03 DIAGNOSIS — R634 Abnormal weight loss: Secondary | ICD-10-CM | POA: Diagnosis not present

## 2020-04-03 DIAGNOSIS — E1065 Type 1 diabetes mellitus with hyperglycemia: Secondary | ICD-10-CM | POA: Diagnosis not present

## 2020-04-03 DIAGNOSIS — E049 Nontoxic goiter, unspecified: Secondary | ICD-10-CM | POA: Diagnosis not present

## 2020-04-03 DIAGNOSIS — R252 Cramp and spasm: Secondary | ICD-10-CM | POA: Diagnosis not present

## 2020-04-03 DIAGNOSIS — I10 Essential (primary) hypertension: Secondary | ICD-10-CM | POA: Diagnosis not present

## 2020-04-03 NOTE — Progress Notes (Addendum)
HPI  [name] is a patient of Dr. Chase Caller who was referred to pharmacy team for [facility] monitoring. Pertinent past medical history includes RA-ILD, chronic respiratory failure with hypoxia, HTN, coronary artery calcification, aoritc atherosclerosis, hypothyroidism, RA. Patient has been approved for [employer] for Ofev, therefore, copay will be $0. Of note, patient works at [employer] everyday between [place] contacted clinic on 03/21/2020 with complaints of upset stomach. Dr. Chase Caller instructed patient to D/C Ofev. Patient was evaluated by Rexene Edison, NP, on 03/28/2020. Complaints of upset stomach persisted although patient had discontinued Ofev therapy. Tammy recommended patient remain off of Ofev and to be seen by PCP. Patient was seen by Wyn Quaker, NP, on 04/10/2020 - patient stated his abdominal pain had resolved.   Patient presents for follow up appt. Patient states he saw his PCP recently who stated he thought abdominal pain was related to Ofev use. Patient states he experienced significant weight loss (reports he weighed ~161 lbs) since he lost his appetite and could not eat when he was taking Ofev. Since he has discontinued Ofev he has regained weight (reports he weighs ~173-174 lbs currently). Did not have diarrhea with Ofev. Patient described his abdominal pain as more nausea related. He did not take any medications during Ofev therapy to help manage nausea. He states he thinks abdominal pain occurred when he took 2 tablets of Ofev, but did not occur when he took 1 tablet of Ofev. [name] adamant about pursuing a trial of alternative antifibrotic before re-trial of lower dose of Ofev.  OBJECTIVE Allergies  Allergen Reactions  . Bactrim [Sulfamethoxazole-Trimethoprim]     Patient states "got short winded"    Outpatient Encounter Medications as of 04/15/2020  Medication Sig Note  . amLODipine (NORVASC) 10 MG tablet Take 10 mg by mouth daily.   [name] Kitchen aspirin 81 MG tablet  Take 81 mg by mouth daily.   [name] Kitchen atorvastatin (LIPITOR) 20 MG tablet Take 20 mg by mouth daily.   [name] Kitchen azaTHIOprine (IMURAN) 50 MG tablet Take 100 mg by mouth daily. Take two tablets by mouth in the morning   . insulin NPH Human (NOVOLIN N) 100 UNIT/ML injection Inject 18 Units into the skin daily. 04/15/2020: Patient sometimes uses twice daily  . levothyroxine (SYNTHROID, LEVOTHROID) 25 MCG tablet Take 25 mcg by mouth daily.   . metFORMIN (GLUCOPHAGE-XR) 500 MG 24 hr tablet Take 2 tablets by mouth 2 (two) times daily.    . pantoprazole (PROTONIX) 40 MG tablet Take 40 mg by mouth daily.   . predniSONE (DELTASONE) 1 MG tablet Take 1 mg by mouth in the morning, at noon, and at bedtime.    . Tiotropium Bromide Monohydrate (SPIRIVA RESPIMAT) 2.5 MCG/ACT AERS Inhale 2 puffs into the lungs daily.   . Insulin Syringe-Needle U-100 (INSULIN SYRINGE .3CC/31GX5/16") 31G X 5/16" 0.3 ML MISC Inject 1 Syringe into the skin as directed.   . Pirfenidone (ESBRIET) 267 MG TABS Take 1 tablet (267 mg total) by mouth in the morning, at noon, and at bedtime for 7 days, THEN 2 tablets (534 mg total) in the morning, at noon, and at bedtime for 7 days, THEN 3 tablets (801 mg total) in the morning, at noon, and at bedtime for 16 days.    No facility-administered encounter medications on file as of 04/15/2020.     Immunization History  Administered Date(s) Administered  . Influenza Split 08/03/2011, 07/03/2012, 09/02/2013, 07/03/2014  . Influenza, High Dose Seasonal PF 08/02/2016, 08/02/2017, 07/25/2018, 07/04/2019  .  Influenza,inj,Quad PF,6+ Mos 07/29/2015  . PFIZER SARS-COV-2 Vaccination 01/13/2020, 02/05/2020  . Pneumococcal Conjugate-13 08/01/2018  . Pneumococcal Polysaccharide-23 11/03/2007  . Zoster Recombinat (Shingrix) 04/11/2019     PFT's TLC  Date Value Ref Range Status  07/29/2015 3.91 L Final     CMP     Component Value Date/Time   NA 141 02/19/2020 0607   K 4.0 02/19/2020 0607   CL 104 02/19/2020  0607   CO2 29 02/19/2020 0607   GLUCOSE 132 (H) 02/19/2020 0607   BUN 9 02/19/2020 0607   CREATININE 0.73 02/19/2020 0607   CREATININE 1.21 (H) 10/17/2018 1641   CALCIUM 7.9 (L) 02/19/2020 0607   PROT 6.9 04/15/2020 1022   ALBUMIN 3.7 04/15/2020 1022   AST 17 04/15/2020 1022   ALT 11 04/15/2020 1022   ALKPHOS 46 04/15/2020 1022   BILITOT 0.3 04/15/2020 1022   GFRNONAA >60 02/19/2020 0607   GFRAA >60 02/19/2020 0607     CBC    Component Value Date/Time   WBC 3.4 (L) 02/19/2020 0607   RBC 3.40 (L) 02/19/2020 0607   HGB 10.6 (L) 02/19/2020 0607   HCT 33.6 (L) 02/19/2020 0607   PLT 192 02/19/2020 0607   MCV 98.8 02/19/2020 0607   MCH 31.2 02/19/2020 0607   MCHC 31.5 02/19/2020 0607   RDW 16.4 (H) 02/19/2020 0607   LYMPHSABS 0.5 (L) 02/19/2020 0607   MONOABS 0.3 02/19/2020 0607   EOSABS 0.4 02/19/2020 0607   BASOSABS 0.0 02/19/2020 0607     LFT's Hepatic Function Latest Ref Rng & Units 04/15/2020 02/29/2020 02/19/2020  Total Protein 6.0 - 8.3 g/dL 6.9 7.4 5.9(L)  Albumin 3.5 - 5.2 g/dL 3.7 4.0 2.7(L)  AST 0 - 37 U/L 17 26 92(H)  ALT 0 - 53 U/L 11 32 96(H)  Alk Phosphatase 39 - 117 U/L 46 104 76  Total Bilirubin 0.2 - 1.2 mg/dL 0.3 0.6 0.6  Bilirubin, Direct 0.0 - 0.3 mg/dL 0.2 0.2 -    G6PD Lab Results  Component Value Date   G6PDH 14.4 10/06/2018    HRCT (July 2020) 1. No significant interval change in severe pulmonary fibrosis, which is heterogeneously distributed and most severe in the right  CT (April 2021) 1. Advanced interstitial fibrosis lung disease bilaterally, RIGHT significantly more advanced than the LEFT. No consolidation or pleural effusion.  ASSESSMENT  1. Esbriet Medication Management Had thorough discussion with patient regarding risk vs benefit with Ofev and Esbriet. Patient is adamant about trial of Esbriet prior to re-trial of Ofev. Will plan to initiate Esbriet in consideration of patient's concerns.   Patient counseled on purpose, proper  use, and potential adverse effects including nausea, vomiting, abdominal pain, GERD, weight loss, arthralgia, dizziness, and suns sensitivity/rash.  Stressed the importance of routine lab monitoring. Will monitor LFT's every month for the first 6 months of treatment then every 3 months. Will monitor CBC every 3 months.  Starting dose will be Esbriet 267 mg 1 tablet three times daily for 7 days, then 2 tablets three times daily for 7 days, then 3 tablets three times daily.  Maintenance dose will be 801 mg 1 tablet three times daily if tolerated.  Stressed the importance of taking with meals to minimize stomach upset.    Patient is due for LFTs now and will be due for CBC 05/2020.   Called patient to notify that his LFTs are WNL and he is safe to start [place].  Future Considerations 1. Slow taper of Esbriet  2. Re-trial of Ofev 150 mg daily (possily 100 mg daily if he does not tolerate 150 mg daily)  2. Medication Reconciliation  A drug regimen assessment was performed, including review of allergies, interactions, disease-state management, dosing and immunization history. Medications were reviewed with the patient, including name, instructions, indication, goals of therapy, potential side effects, importance of adherence, and safe use.  Drug interaction(s): no concerning interactions identified  3. Immunizations  Patient is up to date with all vaccinations.  PLAN 1. Notified patient that LFTs are WNL. Will send Esbriet prescription to [facility]. Verified patient's mailing address. Patient will receive Esbriet via the mail from [facility] within the next 3-5 business days.  All questions encouraged and answered.  Instructed patient to call with any further questions or concerns.  Thank you for allowing pharmacy to participate in this patient's care.  This appointment required 40 minutes of patient care (this includes precharting, chart review, review of results, face-to-face care, etc.).   Drexel Iha, PharmD PGY2 Ambulatory Care Pharmacy Resident

## 2020-04-05 ENCOUNTER — Other Ambulatory Visit (HOSPITAL_COMMUNITY)
Admission: RE | Admit: 2020-04-05 | Discharge: 2020-04-05 | Disposition: A | Discharge status: Home / Self Care | Payer: Medicare Other | Source: Ambulatory Visit | Attending: Internal Medicine | Admitting: Internal Medicine

## 2020-04-05 DIAGNOSIS — Z20822 Contact with and (suspected) exposure to covid-19: Secondary | ICD-10-CM | POA: Insufficient documentation

## 2020-04-05 DIAGNOSIS — Z01812 Encounter for preprocedural laboratory examination: Secondary | ICD-10-CM | POA: Diagnosis not present

## 2020-04-06 LAB — SARS CORONAVIRUS 2 (TAT 6-24 HRS): SARS Coronavirus 2: NEGATIVE

## 2020-04-08 ENCOUNTER — Other Ambulatory Visit: Payer: Self-pay

## 2020-04-08 ENCOUNTER — Other Ambulatory Visit: Payer: Medicare Other

## 2020-04-08 ENCOUNTER — Ambulatory Visit (INDEPENDENT_AMBULATORY_CARE_PROVIDER_SITE_OTHER): Payer: Medicare Other | Admitting: Internal Medicine

## 2020-04-08 ENCOUNTER — Other Ambulatory Visit: Payer: Self-pay | Admitting: Internal Medicine

## 2020-04-08 DIAGNOSIS — J849 Interstitial pulmonary disease, unspecified: Secondary | ICD-10-CM

## 2020-04-08 DIAGNOSIS — J8410 Pulmonary fibrosis, unspecified: Secondary | ICD-10-CM

## 2020-04-08 LAB — PULMONARY FUNCTION TEST
DL/VA % pred: 68 %
DL/VA: 2.77 ml/min/mmHg/L
DLCO cor % pred: 41 %
DLCO cor: 9.15 ml/min/mmHg
DLCO unc % pred: 36 %
DLCO unc: 8.1 ml/min/mmHg
FEF 25-75 Pre: 2.86 L/sec
FEF2575-%Pred-Pre: 146 %
FEV1-%Pred-Pre: 66 %
FEV1-Pre: 1.55 L
FEV1FVC-%Pred-Pre: 118 %
FEV6-%Pred-Pre: 57 %
FEV6-Pre: 1.72 L
FEV6FVC-%Pred-Pre: 105 %
FVC-%Pred-Pre: 54 %
FVC-Pre: 1.73 L
Pre FEV1/FVC ratio: 90 %
Pre FEV6/FVC Ratio: 100 %

## 2020-04-08 NOTE — Progress Notes (Signed)
Spirometry and Dlco done today. 

## 2020-04-10 ENCOUNTER — Ambulatory Visit (INDEPENDENT_AMBULATORY_CARE_PROVIDER_SITE_OTHER): Payer: Medicare Other | Admitting: Pulmonary Disease

## 2020-04-10 ENCOUNTER — Encounter: Payer: Self-pay | Admitting: Pulmonary Disease

## 2020-04-10 ENCOUNTER — Other Ambulatory Visit: Payer: Self-pay

## 2020-04-10 DIAGNOSIS — M069 Rheumatoid arthritis, unspecified: Secondary | ICD-10-CM

## 2020-04-10 DIAGNOSIS — J9611 Chronic respiratory failure with hypoxia: Secondary | ICD-10-CM

## 2020-04-10 DIAGNOSIS — J8410 Pulmonary fibrosis, unspecified: Secondary | ICD-10-CM

## 2020-04-10 NOTE — Progress Notes (Signed)
Virtual Visit via Telephone Note  I connected with [name] on 04/10/20 at 11:45 AM EDT by telephone and verified that I am speaking with the correct person using two identifiers.  Location: Patient: Home Provider: [facility] - [address], [place], [place], [place] [address]   I discussed the limitations, risks, security and privacy concerns of performing an evaluation and management service by telephone and the availability of in person appointments. I also discussed with the patient that there may be a patient responsible charge related to this service. The patient expressed understanding and agreed to proceed.  Patient consented to consult via telephone: Yes People present and their role in pt care: Pt   History of Present Illness:  74 year old male former smoker followed in our office for interstitial lung disease  PMH: Hypertension, rheumatoid arthritis, aortic arthrosclerosis Smoker/ Smoking History: Former Smoker.  Quit 1986.  5 pack years. Maintenance: Ofev (on hold), Rituxan every, Imuran  Pt of: Dr. Chase Caller  Chief complaint:   74 year old male former smoker followed in our office for pulmonary fibrosis.  [name] followed by Dr. Chase Caller.  Patient completing follow-up with our office today after completing pulmonary function testing on 04/08/2020.  There is results are listed below:  04/08/2020-pulmonary function test-FVC 1.73 (54% predicted), ratio 90, FEV1 1.55 (66% predicted), DLCO 8.10 (36% predicted)  Patient's last pulmonary function test was in February/2021 those results are listed below   12/26/2019-pulmonary function test-FVC 1.96 (61% predicted), ratio 86, DLCO 13.14 (58% predicted)  Patient was also seen in May/2021 for a virtual visit patient had abdominal discomfort and Ofev was placed on hold.  He was evaluated by TP NP virtually.  Patient completing televisit with our office today.  He reports that his abdominal pain is improved since  stopping Ofev.  He has gained 9 pounds up to 170 pounds right now.  [name] never formally been on Esbriet.  He reports adherence to his rheumatology medications.  He reports that his breathing he feels has worsened.  [name] now using chronic 4 L of O2 at all times.  This is a change for him.  We will discuss this today.  Observations/Objective:  04/10/20 - SPO2 - 92 (4L O2)   02/17/2020-CMP-AST 168, ALT 134 02/17/2020-hepatic function panel-AST 134, ALT 110, bilirubin 0.4  02/07/2020-CTA chest/abdomen-advanced interstitial fibrosis lung disease bilaterally, right significantly more advanced than left, no consolidation or pleural effusion, no acute findings in the chest, no acute findings within the abdomen or pelvis  12/26/2019-pulmonary function test-FVC 1.96 (61% predicted), ratio 86, DLCO 13.14 (58% predicted)  07/25/2015-CT chest high-res- appearance of the lungs remains compatible with ILD although the distribution of findings is somewhat unusual that overall pattern is most compatible with UIP presumably a manifestation of rheumatoid lung in this patient with a history of rheumatoid arthritis, new 6 x 7 pulmonary nodule right lower lobe  01/20/2016-CT chest without contrast- resolution of right lower lobe pulmonary nodule, no new pulmonary lesions or acute pulmonary findings, stable changes of ILD with fairly extensive honeycombing, stable mediastinal and hilar lymph nodes and collateral vasculature  04/09/2017-CT chest high-res- continued interval progression of fibrotic interstitial lung disease with extensive honeycombing, asymmetrically involving the right long with mild basilar predominance, diagnostic of UIP, stable mild reactive mediastinal lymphadenopathy  05/12/2019-CT chest high-res-no significant interval change in severe pulmonary fibrosis which is heterogeneously distributed most severe in the right lung base featuring extensive honeycombing and mild traction bronchiectasis, findings are not  significantly  changed compared to most recent examination in 2018, clearly progressed over time in comparison of 2013, coronary artery disease  05/26/2019-CT chest with contrast- no acute findings, changes of interstitial fibrosis stable from prior high-resolution CT chest on 05/12/2019, no evidence of pneumonia or pulmonary edema, right thyroid nodule which was biopsied on 12/06/2008 08/01/2018-pulmonary function test--spirometry with DLCO-FVC 2.09 (69% predicted), ratio 89, FEV1 1.87 (84% predicted), DLCO 52  10/06/2018-G6PD-14.4, normal  Social History   Tobacco Use  Smoking Status Former Smoker  . Packs/day: 0.50  . Years: 10.00  . Pack years: 5.00  . Types: Cigarettes  . Quit date: 12/03/1984  . Years since quitting: 35.3  Smokeless Tobacco Never Used  Tobacco Comment   Quit 40 years ago.    Immunization History  Administered Date(s) Administered  . Influenza Split 08/03/2011, 07/03/2012, 09/02/2013, 07/03/2014  . Influenza, High Dose Seasonal PF 08/02/2016, 08/02/2017, 07/25/2018, 07/04/2019  . Influenza,inj,Quad PF,6+ Mos 07/29/2015  . PFIZER SARS-COV-2 Vaccination 01/13/2020, 02/05/2020  . Pneumococcal Conjugate-13 08/01/2018  . Pneumococcal Polysaccharide-23 11/03/2007  . Zoster Recombinat (Shingrix) 04/11/2019   Assessment and Plan:  Rheumatoid arthritis ([place]) Plan: Continue follow-up with rheumatology Continue forward with planned Rituxan infusions Continue Imuran Continue prednisone  Chronic respiratory failure with hypoxia [employer]) Telephone visit today Patient reporting that he requires oxygen when walking now In April/2021 patient did not require oxygen while walked in office  Plan: Continue to maintain oxygen saturations above 88% Continue oxygen as ordered We will discuss your case with Dr. 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 Chase Caller May be candidate for Esbriet trial We will coordinate 6 to 8-week follow-up with our office with Dr. Chase Caller preferably or an APP Lab work today    Follow Up Instructions:  Return in about 6 weeks (around 05/22/2020), or if symptoms worsen or fail to improve, for Follow up with Dr. Purnell Shoemaker.   I discussed the assessment and treatment plan with the patient. The patient was provided an opportunity to ask questions and all were answered. The patient agreed with the plan and demonstrated an understanding of the instructions.   The patient was advised to call back or seek an in-person evaluation if the symptoms worsen or if the condition fails to improve as anticipated.  I provided 23 minutes of non-face-to-face time during this encounter.   Lauraine Rinne, NP

## 2020-04-10 NOTE — Assessment & Plan Note (Signed)
February/2021 pulmonary function test reviewed with patient today June/2021 pulmonary function test reviewed with patient today Pulmonary function testing today showing worsened restriction as well as worsened diffusion defect This is in keeping with April/2021 CTA chest that showed worsened fibrosis Currently holding Ofev due to abdominal pain and weight loss   Plan: Continue follow-up with rheumatology Continue Imuran, Rituxan infusions every 6 months, continue prednisone daily Continue to hold Ofev Continue oxygen therapy as prescribed We will discuss case with Dr. Chase Caller May be candidate for Esbriet trial We will coordinate 6 to 8-week follow-up with our office with Dr. Chase Caller preferably or an APP Lab work today

## 2020-04-10 NOTE — Assessment & Plan Note (Signed)
Telephone visit today Patient reporting that he requires oxygen when walking now In April/2021 patient did not require oxygen while walked in office  Plan: Continue to maintain oxygen saturations above 88% Continue oxygen as ordered We will discuss your case with Dr. Chase Caller and then coordinate follow-up

## 2020-04-10 NOTE — Patient Instructions (Addendum)
You were seen today by Lauraine Rinne, NP  for:   It was nice talking with you today over the phone.  We reviewed your pulmonary function testing.  Unfortunately it appears that the June/2021 pulmonary function test that you completed shows worsened restriction (your ability to take a deep breath then)  1. Rheumatoid arthritis, involving unspecified site, unspecified whether rheumatoid factor present [facility])  Continue follow-up with rheumatology  Continue Imuran  Continue Rituxan infusions  Continue daily prednisone  2. Chronic respiratory failure with hypoxia (HCC)  Continue oxygen therapy as prescribed  >>>maintain oxygen saturations greater than 88 percent  >>>if unable to maintain oxygen saturations please contact the office  >>>do not smoke with oxygen  >>>can use nasal saline gel or nasal saline rinses to moisturize nose if oxygen causes dryness   3. 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 Purnell Shoemaker.   Please do your part to reduce the spread of COVID-19:      Reduce your risk of any infection  and COVID19 by using the similar precautions used for avoiding the common cold or flu:  [name] Kitchen Wash your hands often with soap and warm water for at least 20 seconds.  If soap and water are not readily available, use an alcohol-based hand sanitizer with at least 60% alcohol.  . If coughing or sneezing, cover your mouth and nose by coughing or sneezing into the elbow areas of your shirt or coat, into a tissue or into your sleeve (not your hands). Langley Gauss A MASK when in public  . Avoid shaking hands with others and consider head nods or verbal  greetings only. . Avoid touching your eyes, nose, or mouth with unwashed hands.  . Avoid close contact with people who are sick. . Avoid places or events with large numbers of people in one location, like concerts or sporting events. . If you have some symptoms but not all symptoms, continue to monitor at home and seek medical attention if your symptoms worsen. . If you are having a medical emergency, call 911.   [place] / e-Visit: [email or web address]         [facility]: [facility]: [phone]                   [facility]: [phone]     It is flu season:   >>> Best ways to protect herself from the flu: Receive the yearly flu vaccine, practice good hand hygiene washing with soap and also using hand sanitizer when available, eat a nutritious meals, get adequate rest, hydrate appropriately   Please contact the office if your symptoms worsen or you have concerns that you are not improving.   Thank you for choosing [facility] for your healthcare, and for allowing [place] to partner with you on your healthcare journey. I am thankful to be able to provide care to you today.   Wyn Quaker FNP-C

## 2020-04-10 NOTE — Assessment & Plan Note (Signed)
Plan: Continue follow-up with rheumatology Continue forward with planned Rituxan infusions Continue Imuran Continue prednisone

## 2020-04-11 ENCOUNTER — Telehealth: Payer: Self-pay | Admitting: Pulmonary Disease

## 2020-04-11 DIAGNOSIS — R252 Cramp and spasm: Secondary | ICD-10-CM | POA: Diagnosis not present

## 2020-04-11 DIAGNOSIS — R634 Abnormal weight loss: Secondary | ICD-10-CM | POA: Diagnosis not present

## 2020-04-11 NOTE — Telephone Encounter (Signed)
Received notification from [facility] regarding a prior authorization for ESBRIET. Authorization has been APPROVED from 04/11/20 to 11/01/20.   Authorization # [id number] No pharmacy restrictions.  [employer] for copay assistance. Approval dates are 12/11/19- 12/09/20  Pharmacy Card [id number]       [id number]

## 2020-04-11 NOTE — Telephone Encounter (Signed)
Submitted a Prior Authorization request to [facility] for [place] via Cover My Meds. Will update once we receive a response.  ([employer] Minor) - [id number]

## 2020-04-11 NOTE — Telephone Encounter (Signed)
04/11/2020  Discussed case with Dr. Chase Caller today in clinic.  My recommendations are:  Stop Ofev given history of elevated LFTs as well as abdominal pain and weight loss.  Start patient on Esbriet.  Patient will need to come in and complete Esbriet start paperwork.  Will route to EP CMA to coordinate.  Patient should also have a in office pharmacy team visit to review Esbriet as well as help coordinate the start.  Patient can follow-up with Dr. Chase Caller in 6 to 8 weeks and in ILD clinic slot.  Please contact the patient and let him know with the current recommendations.  Wyn Quaker, FNP

## 2020-04-12 NOTE — Telephone Encounter (Signed)
Pt has been scheduled an OV with pharmacy team 6/14 and has also been scheduled f/u with MR 6/30. Nothing further needed.

## 2020-04-15 ENCOUNTER — Ambulatory Visit (INDEPENDENT_AMBULATORY_CARE_PROVIDER_SITE_OTHER): Payer: Medicare Other | Admitting: Pharmacist

## 2020-04-15 ENCOUNTER — Other Ambulatory Visit: Payer: Self-pay

## 2020-04-15 DIAGNOSIS — J849 Interstitial pulmonary disease, unspecified: Secondary | ICD-10-CM

## 2020-04-15 LAB — HEPATIC FUNCTION PANEL
ALT: 11 U/L (ref 0–53)
AST: 17 U/L (ref 0–37)
Albumin: 3.7 g/dL (ref 3.5–5.2)
Alkaline Phosphatase: 46 U/L (ref 39–117)
Bilirubin, Direct: 0.2 mg/dL (ref 0.0–0.3)
Total Bilirubin: 0.3 mg/dL (ref 0.2–1.2)
Total Protein: 6.9 g/dL (ref 6.0–8.3)

## 2020-04-15 MED ORDER — ESBRIET 267 MG PO TABS: ORAL_TABLET | ORAL | 0 refills | Status: DC

## 2020-04-15 NOTE — Patient Instructions (Signed)
It was a pleasure seeing you in clinic today!  Today the plan is... 1. We will get labs today to check your liver function and call you with your results 2. We will work on process of switching you from [place] to [employer]. We will call you to explain how you will get this medication from the pharmacy. Remember to follow Esbriet dosing instructions provided. You will slowly increase dose. PLEASE call pharmacy team if you have issues tolerating medication. Remember if you become nauseous to try to add ginger to your diet (ginger in vitamin form (vitamin aisle), dramamine (over the counter section of pharmacy), or to eat ginger). Remember to wear sunscreen SPF 50 every day to prevent sunburn.   Please call the PharmD clinic at [phone] if you have any questions that you would like to speak with a pharmacist about [name], [name]).

## 2020-04-15 NOTE — Addendum Note (Signed)
Addended by: Ellwood Handler on: 04/15/2020 04:16 PM   Modules accepted: Orders

## 2020-04-16 DIAGNOSIS — Z13820 Encounter for screening for osteoporosis: Secondary | ICD-10-CM | POA: Diagnosis not present

## 2020-04-16 DIAGNOSIS — J849 Interstitial pulmonary disease, unspecified: Secondary | ICD-10-CM | POA: Diagnosis not present

## 2020-04-16 DIAGNOSIS — M150 Primary generalized (osteo)arthritis: Secondary | ICD-10-CM | POA: Diagnosis not present

## 2020-04-16 DIAGNOSIS — M0589 Other rheumatoid arthritis with rheumatoid factor of multiple sites: Secondary | ICD-10-CM | POA: Diagnosis not present

## 2020-04-16 DIAGNOSIS — Z79899 Other long term (current) drug therapy: Secondary | ICD-10-CM | POA: Diagnosis not present

## 2020-04-16 NOTE — Progress Notes (Signed)
Lft normal

## 2020-04-17 MED FILL — ESBRIET 267 MG TABS: 267 | 30 days supply | Qty: 207 | Fill #0

## 2020-05-01 ENCOUNTER — Telehealth: Payer: Self-pay | Admitting: Internal Medicine

## 2020-05-01 ENCOUNTER — Other Ambulatory Visit: Payer: Self-pay

## 2020-05-01 ENCOUNTER — Encounter: Payer: Self-pay | Admitting: Internal Medicine

## 2020-05-01 ENCOUNTER — Ambulatory Visit: Payer: Medicare Other | Admitting: Internal Medicine

## 2020-05-01 VITALS — BP 126/76 | HR 111 | Temp 98.2°F | Ht 66.0 in | Wt 168.6 lb

## 2020-05-01 DIAGNOSIS — J8410 Pulmonary fibrosis, unspecified: Secondary | ICD-10-CM

## 2020-05-01 DIAGNOSIS — J9611 Chronic respiratory failure with hypoxia: Secondary | ICD-10-CM | POA: Diagnosis not present

## 2020-05-01 DIAGNOSIS — R110 Nausea: Secondary | ICD-10-CM | POA: Diagnosis not present

## 2020-05-01 DIAGNOSIS — R0600 Dyspnea, unspecified: Secondary | ICD-10-CM

## 2020-05-01 DIAGNOSIS — R0609 Other forms of dyspnea: Secondary | ICD-10-CM

## 2020-05-01 DIAGNOSIS — R634 Abnormal weight loss: Secondary | ICD-10-CM

## 2020-05-01 DIAGNOSIS — Z5181 Encounter for therapeutic drug level monitoring: Secondary | ICD-10-CM | POA: Diagnosis not present

## 2020-05-01 LAB — MAGNESIUM: Magnesium: 1.5 mg/dL (ref 1.5–2.5)

## 2020-05-01 LAB — CBC WITH DIFFERENTIAL/PLATELET
Basophils Absolute: 0 10*3/uL (ref 0.0–0.1)
Basophils Relative: 1.2 % (ref 0.0–3.0)
Eosinophils Absolute: 0.2 10*3/uL (ref 0.0–0.7)
Eosinophils Relative: 4.7 % (ref 0.0–5.0)
HCT: 35.1 % — ABNORMAL LOW (ref 39.0–52.0)
Hemoglobin: 11.3 g/dL — ABNORMAL LOW (ref 13.0–17.0)
Lymphocytes Relative: 14.2 % (ref 12.0–46.0)
Lymphs Abs: 0.5 10*3/uL — ABNORMAL LOW (ref 0.7–4.0)
MCHC: 32.1 g/dL (ref 30.0–36.0)
MCV: 97.4 fl (ref 78.0–100.0)
Monocytes Absolute: 0.5 10*3/uL (ref 0.1–1.0)
Monocytes Relative: 12.5 % — ABNORMAL HIGH (ref 3.0–12.0)
Neutro Abs: 2.6 10*3/uL (ref 1.4–7.7)
Neutrophils Relative %: 67.4 % (ref 43.0–77.0)
Platelets: 270 10*3/uL (ref 150.0–400.0)
RBC: 3.6 Mil/uL — ABNORMAL LOW (ref 4.22–5.81)
RDW: 19.1 % — ABNORMAL HIGH (ref 11.5–15.5)
WBC: 3.8 10*3/uL — ABNORMAL LOW (ref 4.0–10.5)

## 2020-05-01 LAB — BASIC METABOLIC PANEL
BUN: 11 mg/dL (ref 6–23)
CO2: 30 mEq/L (ref 19–32)
Calcium: 9.1 mg/dL (ref 8.4–10.5)
Chloride: 101 mEq/L (ref 96–112)
Creatinine, Ser: 0.89 mg/dL (ref 0.40–1.50)
GFR: 100.82 mL/min (ref >60.00)
Glucose, Bld: 140 mg/dL — ABNORMAL HIGH (ref 70–99)
Potassium: 3.8 mEq/L (ref 3.5–5.1)
Sodium: 141 mEq/L (ref 135–145)

## 2020-05-01 LAB — HEPATIC FUNCTION PANEL
ALT: 16 U/L (ref 0–53)
AST: 20 U/L (ref 0–37)
Albumin: 3.9 g/dL (ref 3.5–5.2)
Alkaline Phosphatase: 47 U/L (ref 39–117)
Bilirubin, Direct: 0.1 mg/dL (ref 0.0–0.3)
Total Bilirubin: 0.4 mg/dL (ref 0.2–1.2)
Total Protein: 7.1 g/dL (ref 6.0–8.3)

## 2020-05-01 LAB — PHOSPHORUS: Phosphorus: 2.9 mg/dL (ref 2.3–4.6)

## 2020-05-01 MED ORDER — ONDANSETRON HCL 4 MG PO TABS: 4.0000 mg | ORAL_TABLET | Freq: Three times a day (TID) | ORAL | 5 refills | Status: DC | PRN

## 2020-05-01 NOTE — Telephone Encounter (Signed)
See in referrals tab that pt's cardiology referral was sent to [facility] off [address] and spoke with pt letting him know this info and stated to him that cardiology will contact him with the appt info. Pt verbalized understanding. Nothing further needed.

## 2020-05-01 NOTE — Patient Instructions (Addendum)
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

## 2020-05-01 NOTE — Progress Notes (Signed)
OV 01/01/2017  Chief Complaint  Patient presents with  . Follow-up    Pt here after PFT. Pt states overall his breathing is unchanged since last OV. Pt c/o occ dry cough. Pt deneis CP/tightness and f/c/s.    Follow-up interstitial lung disease UIP pattern associated with rheumatoid arthritis and immune suppression. Last visit in March 2017. He doesn't that overall he has been stable. He has a new rheumatologist but still at [facility] [name]. His wife also says he is stable. However as he started talking to them his wife slowly started telling me that for the last few to several months he is wheezing more than usual and is a little more short of breath than usual. However he denied it. This is compliant with his medications. [name] not on any inhaler therapy. He uses oxygen only at night. Previous CT scans of the chest in September 2016 in March 2017 did not report any associated emphysema. His most recent pulmonary function test is shown that on Combivent. I personally visualized the graph: [name] Kitchen Forced vital capacity is stable with a diffusion capacity seems to decline over time. Walking desaturation test 185 feet 3 laps on room air: He desaturated to 84%. And then we walked him on 2 L oxygen he did not desaturate.   OV 06/23/2017  Chief Complaint  Patient presents with  . Follow-up    pt here after CT in 04/2017. Pt states his breathing is unchanged since last OV. Pt c/o interemittent dry cough. Pt denies CP/tightness and f/c/s.      Follow-up interstitial lung disease UIP pattern associated with rheumatoid arthritis and on Imuran. Last visit in March 2018 I was concerned about progressive interstitial lung disease because he started desaturating on room air and his FVC show decline. Currently rated a high-resolution CT chest that confirmed progressive lung disease. We walked him 185 feet 3 laps on room air and he desaturated at third lap to 89%. This is very  similar to previous visit suggesting that  that his decline has been more gradual over the last 1 year. Compared to last visit he feels stable. His wife feels the same. [name] aware of his worsening fibrosis and the potential need to change anti fibrotics  OV 09/27/2017  Chief Complaint  Patient presents with  . Follow-up    PFT done today. Pt states that he thinks his breathing has become worse. Has complaints of SOB with exertion, occ. dry cough. Denies any CP. States that he does wear O2 at night, DME: [facility].     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  Patient is on Imuran.  [name] having progressive disease this year 2018.  He told my certified medical assistant that he is having progressive shortness of breath but to me he tells me that he is stable.  As always he is a very poor historian.  Wife gives a better history.  He has oxygen at home for the night which she uses but does not use his exertional small portable system even though this helps him.  They do admit that dyspnea is progressive.  In fact pulmonary function test today shows progressive disease.  He also seem to desaturate a little bit more easily.  He continues to be on Imuran.  In talking to him it appears his rheumatologist has now started him on Rituxan.  He has had 2 doses 2 weeks apart with the most  recent one being mid November 2018.  The next dose is at mid May 2019 which is 51-monthinterval.  He says he is up-to-date with his flu shot but is not had a conversation with his primary care physician about the new shingles vaccine that is inactivated vaccine.  [name] not having much of joint issues.  [name] aware that the Rituxan is specifically designed to slow down the progression of pulmonary fibrosis.  Walking desaturation test 185 feet x3 laps on room air with a forehead probe.  Resting heart rate was 95/min.  Final heart rate 108/min.  Resting pulse ox was 95%.  By the time he  finished his second lap he desaturated to 84%.  He was then placed on 2 L nasal cannula and he was able to maintain his saturation to 95%.   OV 12/20/2017  Chief Complaint  Patient presents with  . Follow-up    follow up PFT,SOB with activity/exertion   [name] is here for follow-up because of his progressive UIP pattern pulmonary fibrosis related to rheumatoid arthritis.  At his last visit he was started on Rituxan by rheumatology.  Since then we started him on portable oxygen but he says he is not using it because he does not like to use it.  He does use oxygen at night which is helping.  His next dose of Rituxan is in May 2019.  Both he and his wife attest that since the start of Rituxan his symptoms are stable particularly shortness of breath is not any worse.  In fact pulmonary function test today shows continued ongoing stability with FVC and DLCO would suggest that the Rituxan might be helping him.  There are no new issues.  [name] here to have a shingles vaccine.  I told him about this last visit to talk to primary care physician but he does not remember this.   IMPRESSION: June 2018 1. Continued interval progression of fibrotic interstitial lung disease with extensive honeycombing, asymmetrically involving the right lung, with mild basilar predominance, diagnostic of usual interstitial pneumonia (UIP) . 2. Stable mild reactive mediastinal lymphadenopathy. 3. Aortic atherosclerosis.  One vessel coronary atherosclerosis.   Electronically Signed   By: [name]   On: 04/09/2017 10:51   OV 08/01/2018  Subjective:  Patient ID: LDereon Corkery male , DOB: [birth year], age 74y.o. , MRN: [id number], ADDRESS: [address] [place] [id number]  08/01/2018 -   Chief Complaint  Patient presents with  . Follow-up    PFT today, increased coughing (non-productive)    Follow-up interstitial lung disease UIP pattern associated with rheumatoid arthritis. Rx Rituxan since approx  Oct/Nov 2018. Background Immuran  HPI LKeyandreScales 74y.o. -presents as usual with his wife.  He tells me that he continues on background Imuran.  [name] also on scheduled Rituxan.  He has an infusion coming up in a month or so.  This is according to his wife.  He continues to be a poor historian.  As best as I can gather from both of them is that his shortness of breath is actually a little bit better ever since starting Rituxan..  In fact his PFTs show continued improvement/stability.  With his FVC and DLCO.  However he tells me that his cough might be a little bit worse.  In talking to him it appears that is only mildly worse.  It happens mostly at night when he lies down.  He coughs a  few times and then it does not bother him.  The cough is dry.  He denies any postnasal drip or acid reflux disease.  He does not have any fever or sputum production.       OV 10/06/2018  Subjective:  Patient ID: Gatsby Chismar, male , DOB: [birth year] , age 25 y.o. , MRN: [id number] , ADDRESS: [address] [place] [address]   10/06/2018 -   Chief Complaint  Patient presents with  . Follow-up    f/u pulmonary fibrosis, no symptoms, complaints, breathing doind well     HPI Raford Dao 74 y.o. -returns for ILD follow-up in the setting of rheumatoid arthritis.  This time he is not here with his wife who has other schedule conflicts.  He tells me overall he stable.  He has now started Rituxan.  As again he is a poor historian.  I have him starting Rituxan a year ago but he told me that it was only started recently and has had only 1 infusion.  He tells me that he is stable.  His cough is well controlled with this anticholinergic he is using nighttime oxygen.  He says he is compliant with his medications.  Last visit I told him to inquire about using Bactrim prophylaxis in the setting of B-cell depletion in the setting of Rituxan.  He has no idea recollection of this conversation.  So I did tell him that we will check  his G6PD and discuss directly with his rheumatologist about starting Bactrim prophylaxis.  [name] okay with this plan.  Overall dyspnea and cough are stable and it is mild.  Walking desaturation test shows stability.     OV 06/29/2019  Subjective:  Patient ID: Kalden Wanke, male , DOB: 1945-11-10 , age 27 y.o. , MRN: [id number] , ADDRESS: [address] [place] [address]   Follow-up interstitial lung disease UIP pattern associated with rheumatoid arthritis. Rx Rituxan since approx Oct/Nov 2018. On Background Immuran, prednisone and dapsone for pcp prophylaxois = high risk medication use   06/29/2019 - ILD- RA  followup    HPI Cian Cauble 74 y.o. -presents for follow-up.  I personally saw him December 2019 before the onset of the pandemic.  He tells me that he is doing fair.  He feels he is overall stable.  He continues to be on Rituxan.  He tells me the last dose of this infusion was in May 05, 2019.  This is managed by rheumatology.  In addition he is on Imuran, prednisone.  Also by middle of year my nurse practitioner started him on dapsone for PCP prophylaxis.  He did have a high-resolution CT chest that shows UIP findings and stability since 2018.  However, he did desaturate with exertion when he saw the nurse practitioner just over a month ago.  This is a new finding as documented below.  We walked him again today and again found his desaturation with exertion.  It required 5 L to correct.  In past he has done well with RA at times but at times required 2L. He denies any fever or sputum production.  His previous echocardiogram was in 2013.  Most recent CT scan of the chest notes coronary artery calcification   Noted WBC going down after dapsine start Results for LIZANDRO, SPELLMAN (MRN [id number]) as of 06/29/2019 10:19  Ref. Range 05/30/2019 10:17 06/07/2019 09:07 06/13/2019 08:59 06/20/2019 09:50  WBC Latest Ref Range: 4.0 - 10.5 K/uL 8.8 6.2 4.3 3.4 (L)  IMPRESSION: HRCT July 2020 1. No  significant interval change in severe pulmonary fibrosis, which is heterogeneously distributed and most severe in the right lung base, featuring extensive honeycombing and mild traction bronchiectasis. Although findings are not significantly changed compared to most recent examination dated 2018, they are clearly progressed over time in comparison to priors dating back to 2013. Findings remain in a UIP pattern by ATS pulmonary fibrosis criteria  2.  Coronary artery disease.   Electronically Signed   By: Eddie Candle [name]   On: 05/12/2019 13:14  ROS - per HPI   OV 01/04/2020  Subjective:  Patient ID: [name], male , DOB: 01/03/1946 , age 26 y.o. , MRN: [id number] , [address] [place] [address]   01/04/2020 -   Chief Complaint  Patient presents with  . Follow-up    Pt states he has been doing good since last visit. States he still has an occ cough.    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 Rx Rituxan since approx Oct/Nov 2018. On Background Immuran, prednisone and  = high risk medication use     HPI Leverne Murry 74 y.o. -returns for follow-up.  Last seen in the summer 2020.  Overall he is doing stable.  He continues with his immunosuppressive medications through rheumatology.  This include Imuran prednisone and Rituxan.  He had his infusion of Rituxan this week.  He feels stable but his pulmonary function test done for this visit shows a decline.  It is documented below his walking desaturation test also is a decline from the past but similar to last visit 6 months or so ago.  His main symptom is chronic cough.  [name] labeled it as a level 3 out of 5 but it appears it might actually be more severe.  [name] asking for palliative relief of cough.  His last liver function test with [place] was in the summer 2020.  It appears per review of the chart in the interim he has seen cardiology Dr. Percival Spanish for his coronary artery  calcification and is on expectant follow-up for that.  No stress test has been recommended because of the lack of chest pain.  His wife who is usually here with him is not here today.  He said she slept in.  [name] on MDI - no emphysema noted in prior CT    ROS - per HPI  OV 02/15/2020  Subjective:  Patient ID: Rohil Lesch, male , DOB: 24-Mar-1946 , age 71 y.o. , MRN: [id number] , ADDRESS: [place] [id number]   02/15/2020 -   Chief Complaint  Patient presents with  . Follow-up    Follow-up interstitial lung disease UIP pattern associated with rheumatoid arthritis.   - Rx Rituxan since approx Oct/Nov 2018. On Background Immuran, prednisone and  = high risk medication use  - started ofev mid -  end of march 2021 for progressive disease    HPI Maximo Mullenax 74 y.o. -he has progressive ILD because of rheumatoid arthritis UIP pattern.  Last visit started him on nintedanib.  [name] now here to follow-up for tolerance and monitoring of nintedanib because this requires intensive therapeutic monitoring.  [name] also immunosuppressed.  He tells me that he is now started nintedanib approximately 1 month ago.  [name] tolerating it fine without any nausea vomiting diarrhea.  Last visit he took the consent form for the ILD-pro registry study.  [name]  agreed now to participate in it.  He meets inclusion exclusion criteria because of progressive disease.  At this point in time he is got minimal symptoms.  He desaturated as before.  However he is not compliant with his portable oxygen with exertion.  His cough is improved because of Hycodan.   OV 05/01/2020  Subjective:  Patient ID: Chanse Kagel, male , DOB: 21-Jun-1946 , age 21 y.o. , MRN: [id number] , ADDRESS: [place] [address]   05/01/2020 -   Chief Complaint  Patient presents with  . Follow-up    pt is here to to disuss ild    S: symptos score below. HAs lost weight. ILD concernng for progerssion. Has  nausea  SYMPTOM SCALE - ILD 06/29/2019  01/04/2020  02/15/2020  05/01/2020   O2 use Uses 3-5L with exertion yuses 3-5L with exertion Non compliant 168# and 4L with exertion - on esbriet  Shortness of Breath 0 -> 5 scale with 5 being worst (score 6 If unable to do)     At rest 3 0 0 4  Simple tasks - showers, clothes change, eating, shaving 2 1 0 5  Household (dishes, doing bed, laundry) 2 1 0 5  Shopping _0 Walking level at own pace _1 Walking up Stairs _2 Total (40 - 48) Dyspnea Score _3 How bad is your cough? _4 How bad is your fatigue 2 1 0 4  nasuea    4  vomit    0  diarrhea  0 0 0  anxiety  0 0 0  deipression  0 0 0       Simple office walk 08/01/2018 -  185 feet x  3 laps goal with forehead probe at [place]  10/06/2018 250 feet x 3 laps 06/29/2019  01/04/2020  02/15/2020   O2 used _5   Number laps completed 3 3 Aim for 3 1 Did only 1 of 3 Did all 3  Comments about pace normal normal  avg pace avg opae  Resting Pulse Ox/HR 100% and 80/min 100% and 81/min 94% and 96/min 96% and 101/min 99% and 98/min  Final Pulse Ox/HR 94% and 114/min 91% and 111/min 86% at first lap, HR 131 86% and 118/min 88% and 123/min  Desaturated </= 88% no no  yes yes  Desaturated <= 3% points yes yes  yes yes  Got Tachycardic >/= 90/min yes yes  yes yes  Symptoms at end of test x x  Mild dyspnea dyspnea  Miscellaneous comments x x Needed 5L [place] to correct      Results for KLEIN, WILLCOX (MRN [id number]) as of 01/04/2020 09:36  Ref. Range 01/23/2014 15:38 10/01/2014 11:24 07/29/2015 11:09 12/18/2016 09:34 09/27/2017 09:33 12/20/2017 11:29 08/01/2018 09:39 12/26/2019 08:54 04/08/20  FVC-Pre Latest Units: L 2.18 2.05 2.17 2.21 2.06 2.10 2.09 1.96 1.73  FVC-%Pred-Pre Latest Units: % 66 62 66 68 64 65 69 61 54%   Results for DILLARD, PASCAL (MRN [id number]) as of 01/04/2020 09:36  Ref. Range 01/23/2014 15:38 10/01/2014 11:24 07/29/2015 11:09  12/18/2016 09:34 09/27/2017 09:33 12/20/2017 11:29 08/01/2018 09:39 12/26/2019 08:54 6/7  DLCO unc Latest Units: ml/min/mmHg 17.93 16.86 16.72 13.81 12.14 12.30 13.24 13.14 9/15  DLCO unc % pred Latest Units: % 66 62 62 51 45 45 52 58 41%   ROS - per  HPI    ROS - per HPI     has a past medical history of Arthritis, COPD (chronic obstructive pulmonary disease) ([place]), Diabetes mellitus, Hypertension, Pneumonia, and Thyroid disease.   reports that he quit smoking about 35 years ago. His smoking use included cigarettes. He has a 5.00 pack-year smoking history. He has never used smokeless tobacco.  Past Surgical History:  Procedure Laterality Date  . KNEE ARTHROSCOPY    . NO PAST SURGERIES    . RIGHT/LEFT HEART CATH AND CORONARY ANGIOGRAPHY N/A 05/21/2020   Procedure: RIGHT/LEFT HEART CATH AND CORONARY ANGIOGRAPHY;  Surgeon: Martinique, Peter M, MD;  Location: [facility];  Service: Cardiovascular;  Laterality: N/A;    Allergies  Allergen Reactions  . Bactrim [Sulfamethoxazole-Trimethoprim] Shortness Of Breath    Patient states "got short winded"  . Ofev [Nintedanib]     Immunization History  Administered Date(s) Administered  . Influenza Split 08/03/2011, 07/03/2012, 09/02/2013, 07/03/2014  . Influenza, High Dose Seasonal PF 08/02/2016, 08/02/2017, 07/25/2018, 07/04/2019  . Influenza,inj,Quad PF,6+ Mos 07/29/2015  . PFIZER SARS-COV-2 Vaccination 01/13/2020, 02/05/2020  . Pneumococcal Conjugate-13 08/01/2018  . Pneumococcal Polysaccharide-23 11/03/2007  . Zoster Recombinat (Shingrix) 04/11/2019    Family History  Problem Relation Age of Onset  . Diabetes Mother   . Stroke Mother      Current Outpatient Medications:  .  amLODipine (NORVASC) 10 MG tablet, Take 10 mg by mouth daily., Disp: , Rfl:  .  aspirin 81 MG tablet, Take 81 mg by mouth daily., Disp: , Rfl:  .  atorvastatin (LIPITOR) 20 MG tablet, Take 20 mg by mouth daily., Disp: , Rfl: 2 .  azaTHIOprine (IMURAN) 50  MG tablet, Take 100 mg by mouth daily. , Disp: , Rfl:  .  insulin NPH Human (NOVOLIN N) 100 UNIT/ML injection, Inject 12 Units into the skin daily. , Disp: , Rfl:  .  Insulin Syringe-Needle U-100 (INSULIN SYRINGE .3CC/31GX5/16") 31G X 5/16" 0.3 ML MISC, Inject 1 Syringe into the skin as directed., Disp: , Rfl:  .  levothyroxine (SYNTHROID, LEVOTHROID) 25 MCG tablet, Take 25 mcg by mouth daily., Disp: , Rfl:  .  pantoprazole (PROTONIX) 40 MG tablet, Take 40 mg by mouth daily., Disp: , Rfl:  .  predniSONE (DELTASONE) 1 MG tablet, Take 3 mg by mouth daily. , Disp: , Rfl:  .  Tiotropium Bromide Monohydrate (SPIRIVA RESPIMAT) 2.5 MCG/ACT AERS, Inhale 2 puffs into the lungs daily. (Patient taking differently: Inhale 2 puffs into the lungs daily as needed (shortness of breath). ), Disp: 4 g, Rfl: 0 .  acetaminophen (TYLENOL) 500 MG tablet, Take 1,000 mg by mouth every 6 (six) hours as needed for moderate pain or headache., Disp: , Rfl:  .  metFORMIN (GLUCOPHAGE-XR) 500 MG 24 hr tablet, Take 2 tablets (1,000 mg total) by mouth 2 (two) times daily., Disp: , Rfl:  .  ondansetron (ZOFRAN) 4 MG tablet, Take 1 tablet (4 mg total) by mouth 3 (three) times daily as needed for nausea or vomiting., Disp: 30 tablet, Rfl: 5 .  Pirfenidone (ESBRIET) 267 MG TABS, Take 3 tablets (801 mg total) by mouth with breakfast, with lunch, and with evening meal., Disp: 270 tablet, Rfl: 1      Objective:   Vitals:   05/01/20 1024  BP: 126/76  Pulse: (!) 111  Temp: 98.2 F (36.8 C)  TempSrc: Oral  SpO2: 93%  Weight: 168 lb 9.6 oz (76.5 kg)  Height: _0  (1.676 m)    Estimated  body mass index is 27.21 kg/m as calculated from the following:   Height as of this encounter: _0  (1.676 m).   Weight as of this encounter: 168 lb 9.6 oz (76.5 kg).  _1 @  Filed Weights   05/01/20 1024  Weight: 168 lb 9.6 oz (76.5 kg)     Physical Exam Seems to have lost weight. Has RA findings. Hs clubbing. Stoic  historican . Otherwise baseline exam      Assessment:       ICD-10-CM   1. 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)  J84.10 CBC w/Diff    Basic Metabolic Panel (BMET)    Hepatic function panel    Magnesium    Phosphorus    Ambulatory referral to Cardiology    ECHOCARDIOGRAM COMPLETE    Phosphorus    Magnesium    Hepatic function panel    Basic Metabolic Panel (BMET)    CBC w/Diff  2. Chronic respiratory failure with hypoxia (HCC)  J96.11   3. Therapeutic drug monitoring  Z51.81 CBC w/Diff    Basic Metabolic Panel (BMET)    Hepatic function panel    Magnesium    Phosphorus    Ambulatory referral to Cardiology    ECHOCARDIOGRAM COMPLETE    Phosphorus    Magnesium    Hepatic function panel    Basic Metabolic Panel (BMET)    CBC w/Diff  4. Nausea  R11.0 CBC w/Diff    Basic Metabolic Panel (BMET)    Hepatic function panel    Magnesium    Phosphorus    Ambulatory referral to Cardiology    ECHOCARDIOGRAM COMPLETE    Phosphorus    Magnesium    Hepatic function panel    Basic Metabolic Panel (BMET)    CBC w/Diff  5. Weight loss  R63.4 CBC w/Diff    Basic Metabolic Panel (BMET)    Hepatic function panel    Magnesium    Phosphorus    Ambulatory referral to Cardiology    ECHOCARDIOGRAM COMPLETE    Phosphorus    Magnesium    Hepatic function panel    Basic Metabolic Panel (BMET)    CBC w/Diff  6. Dyspnea on exertion  R06.00        Plan:     Patient Instructions  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) Chronic respiratory failure with hypoxia (HCC)  - I am worried about progression  Plan  - continue esbriet  - ensure you wear sunscreen  Therapeutic drug monitoring Nausea Weight loss  - check cbc, bmet, lft, mag, phos - take zofran 44m tid prn with esbriet to counter nausea - continue esbr  Dyspnea on exertion   - wore and likely due to lung disease  Plan  - get echo  - refer cardiology for right heart cath  Followup  - 4-8  weeks with Dr RChase Calleror app - 30 min slot - bring your wife - to discuss goals and current status     SIGNATURE    Dr. MBrand Males [name], [name],  Pulmonary and Critical Care Medicine Staff Physician, CStrattonDirector - Interstitial Lung Disease  Program  Pulmonary [place] [address] Pager: [phone] If no answer or between  15:00h - 7:00h: call [phone] Telephone: [phone]  10:32 AM 06/17/2020

## 2020-05-02 DIAGNOSIS — R0902 Hypoxemia: Secondary | ICD-10-CM | POA: Diagnosis not present

## 2020-05-02 DIAGNOSIS — J129 Viral pneumonia, unspecified: Secondary | ICD-10-CM | POA: Diagnosis not present

## 2020-05-07 ENCOUNTER — Telehealth: Payer: Self-pay | Admitting: Cardiology

## 2020-05-07 NOTE — Telephone Encounter (Signed)
The patient has been made aware that his pulmonologist has referred him back to cardiology and that his appointment is at the [facility] office in [place].

## 2020-05-07 NOTE — Telephone Encounter (Signed)
Patient is requesting to speak with Dr. Rosezella [place] nurse to discuss whether or not appointment scheduled for 05/09/20 is necessary. Please call to discuss.

## 2020-05-08 ENCOUNTER — Other Ambulatory Visit: Payer: Self-pay | Admitting: Internal Medicine

## 2020-05-08 DIAGNOSIS — Z79899 Other long term (current) drug therapy: Secondary | ICD-10-CM

## 2020-05-08 DIAGNOSIS — J849 Interstitial pulmonary disease, unspecified: Secondary | ICD-10-CM

## 2020-05-08 MED ORDER — ESBRIET 267 MG PO TABS: 3.0000 | ORAL_TABLET | Freq: Three times a day (TID) | ORAL | 1 refills | Status: DC

## 2020-05-08 NOTE — Progress Notes (Signed)
Cardiology Office Note   Date:  05/09/2020   ID:  [name], DOB [birth year], MRN [id number]  PCP:  Merrilee Seashore, MD  Cardiologist:   No primary care provider on file. Referring:  Dr. Chase Caller  Chief Complaint  Patient presents with  . Shortness of Breath      History of Present Illness: [name] is a 74 y.o. male who is referred by Dr. Chase Caller for evaluation of increasing DOE and coronary artery calcification (left).  [name] treated for lung disease with a UIP pattern.   The patient has no past cardiac history.  He did have an echo in 2013 that had a normal EF.    He has had worsening oxygenation and is referred back for a right heart cath.  Echo has been ordered.    He was treated with Ofev but he has not tolerated this.  I do see that Dr. Chase Caller is considering retrial with a lower dose.  [name] currently being treated with Imuran and pirfenidone.  He says he short of breath progressively doing mild activities and would be able to walk a flight of stairs without stopping.  [name] not describing resting shortness of breath, PND or orthopnea.  [name] not having palpitations, presyncope or syncope.  His biggest issue is a nonproductive cough.  [name] not describing chest pressure, neck or arm discomfort.   Past Medical History:  Diagnosis Date  . Arthritis   . COPD (chronic obstructive pulmonary disease) ([place])   . Diabetes mellitus   . Hypertension   . Pneumonia   . Thyroid disease     Past Surgical History:  Procedure Laterality Date  . KNEE ARTHROSCOPY    . NO PAST SURGERIES       Current Outpatient Medications  Medication Sig Dispense Refill  . amLODipine (NORVASC) 10 MG tablet Take 10 mg by mouth daily.    [name] Kitchen aspirin 81 MG tablet Take 81 mg by mouth daily.    [name] Kitchen atorvastatin (LIPITOR) 20 MG tablet Take 20 mg by mouth daily.  2  . azaTHIOprine (IMURAN) 50 MG tablet Take 100 mg by mouth daily. Take two tablets by mouth in the morning    . insulin NPH Human  (NOVOLIN N) 100 UNIT/ML injection Inject 18 Units into the skin daily.    . Insulin Syringe-Needle U-100 (INSULIN SYRINGE .3CC/31GX5/16") 31G X 5/16" 0.3 ML MISC Inject 1 Syringe into the skin as directed.    [name] Kitchen levothyroxine (SYNTHROID, LEVOTHROID) 25 MCG tablet Take 25 mcg by mouth daily.    . metFORMIN (GLUCOPHAGE-XR) 500 MG 24 hr tablet Take 2 tablets by mouth 2 (two) times daily.     . ondansetron (ZOFRAN) 4 MG tablet Take 1 tablet (4 mg total) by mouth 3 (three) times daily as needed for nausea or vomiting. 30 tablet 5  . pantoprazole (PROTONIX) 40 MG tablet Take 40 mg by mouth daily.    . Pirfenidone (ESBRIET) 267 MG TABS Take 3 tablets (801 mg total) by mouth with breakfast, with lunch, and with evening meal. 270 tablet 1  . predniSONE (DELTASONE) 1 MG tablet Take 1 mg by mouth in the morning, at noon, and at bedtime.     . Tiotropium Bromide Monohydrate (SPIRIVA RESPIMAT) 2.5 MCG/ACT AERS Inhale 2 puffs into the lungs daily. 4 g 0   No current facility-administered medications for this visit.    Allergies:   Bactrim [sulfamethoxazole-trimethoprim]    ROS:  Please see the history  of present illness.   Otherwise, review of systems are positive for none .   All other systems are reviewed and negative.    PHYSICAL EXAM: VS:  BP 132/70   Pulse 70   Ht _0  (1.676 m)   Wt 172 lb 12.8 oz (78.4 kg)   SpO2 92%   BMI 27.89 kg/m  , BMI Body mass index is 27.89 kg/m. GENERAL:  Well appearing NECK:  No jugular venous distention, waveform within normal limits, carotid upstroke brisk and symmetric, no bruits, no thyromegaly LUNGS:  Clear to auscultation bilaterally CHEST: Diffuse coarse crackles HEART:  PMI not displaced or sustained,S1 and S2 within normal limits, no S3, no S4, no clicks, no rubs, no murmurs ABD:  Flat, positive bowel sounds normal in frequency in pitch, no bruits, no rebound, no guarding, no midline pulsatile mass, no hepatomegaly, no splenomegaly EXT:  2 plus pulses  throughout, no edema, no cyanosis no clubbing    EKG:  EKG is  ordered today. The ekg ordered today demonstrates sinus rhythm, rate 106, axis rightward, intervals within normal limits, no acute changes.   Recent Labs: 05/26/2019: B Natriuretic Peptide 43.6 05/01/2020: ALT 16; BUN 11; Creatinine, Ser 0.89; Hemoglobin 11.3; Magnesium 1.5; Platelets 270.0; Potassium 3.8; Sodium 141    Lipid Panel No results found for: CHOL, TRIG, HDL, CHOLHDL, VLDL, LDLCALC, LDLDIRECT    Wt Readings from Last 3 Encounters:  05/09/20 172 lb 12.8 oz (78.4 kg)  05/01/20 168 lb 9.6 oz (76.5 kg)  02/29/20 178 lb (80.7 kg)      Other studies Reviewed: Additional studies/ records that were reviewed today include:  Review of the above records demonstrates:  Please see elsewhere in the note.     ASSESSMENT AND PLAN:  CAD: He had this noted on CT.  I am going to send him for right and left heart catheterization with the right heart requested by pulmonary.  I think we need to exclude obstructive coronary disease as well with his dyspnea on exertion.  [name] also going to get an echocardiogram to further evaluate left ventricular function although I do not strongly suspect this or valvular abnormalities.  AORTIC ATHEROSCLEROSIS: His diabetes is managed well.  Is not smoking cigarettes.  LDL is at target.  No change in therapy.  INTERSTITIAL LUNG DISEASE/UIP:  He has been sent for work up for right heart cath.   [name] being managed as above.  I will be checking the right heart cath pressures as above.   Current medicines are reviewed at length with the patient today.  The patient does not have concerns regarding medicines.  The following changes have been made:  None  Labs/ tests ordered today include:   Orders Placed This Encounter  Procedures  . CBC with Differential/Platelet  . Basic metabolic panel  . EKG 12-Lead     Disposition:   FU with me as needed based on the results of the  catheterization.   Signed, Minus Breeding, MD  05/09/2020 10:49 AM    [facility]

## 2020-05-08 NOTE — Addendum Note (Signed)
Addended by: Mariella Saa C on: 05/08/2020 12:08 PM   Modules accepted: Orders

## 2020-05-08 NOTE — Telephone Encounter (Signed)
Contacted by pharmacy stating that a second starter dose was sent to the pharmacy to refill.  Patient should be on maintenance dose at this time.  Due to history of stomach upset with Ofev we will send in maintenance dose of 267 mg 3 tablets 3 times daily with food in case his dose needs to be adjusted due to side effects and/or abnormal labs.    If he continues to tolerate 3 tablets 3 times daily we can transition to 801 mg 1 tablet 3 times daily.  He will be due for labs at the end of July.   Mariella Saa, PharmD, [place], CPP Clinical Specialty Pharmacist (Rheumatology and Pulmonology)  05/08/2020 12:04 PM

## 2020-05-08 NOTE — H&P (View-Only) (Signed)
Cardiology Office Note   Date:  05/09/2020   ID:  [name], DOB [birth year], MRN [id number]  PCP:  Merrilee Seashore, MD  Cardiologist:   No primary care provider on file. Referring:  Dr. Chase Caller  Chief Complaint  Patient presents with  . Shortness of Breath      History of Present Illness: [name] is a 74 y.o. male who is referred by Dr. Chase Caller for evaluation of increasing DOE and coronary artery calcification (left).  [name] treated for lung disease with a UIP pattern.   The patient has no past cardiac history.  He did have an echo in 2013 that had a normal EF.    He has had worsening oxygenation and is referred back for a right heart cath.  Echo has been ordered.    He was treated with Ofev but he has not tolerated this.  I do see that Dr. Chase Caller is considering retrial with a lower dose.  [name] currently being treated with Imuran and pirfenidone.  He says he short of breath progressively doing mild activities and would be able to walk a flight of stairs without stopping.  [name] not describing resting shortness of breath, PND or orthopnea.  [name] not having palpitations, presyncope or syncope.  His biggest issue is a nonproductive cough.  [name] not describing chest pressure, neck or arm discomfort.   Past Medical History:  Diagnosis Date  . Arthritis   . COPD (chronic obstructive pulmonary disease) ([place])   . Diabetes mellitus   . Hypertension   . Pneumonia   . Thyroid disease     Past Surgical History:  Procedure Laterality Date  . KNEE ARTHROSCOPY    . NO PAST SURGERIES       Current Outpatient Medications  Medication Sig Dispense Refill  . amLODipine (NORVASC) 10 MG tablet Take 10 mg by mouth daily.    [name] Kitchen aspirin 81 MG tablet Take 81 mg by mouth daily.    [name] Kitchen atorvastatin (LIPITOR) 20 MG tablet Take 20 mg by mouth daily.  2  . azaTHIOprine (IMURAN) 50 MG tablet Take 100 mg by mouth daily. Take two tablets by mouth in the morning    . insulin NPH Human  (NOVOLIN N) 100 UNIT/ML injection Inject 18 Units into the skin daily.    . Insulin Syringe-Needle U-100 (INSULIN SYRINGE .3CC/31GX5/16") 31G X 5/16" 0.3 ML MISC Inject 1 Syringe into the skin as directed.    [name] Kitchen levothyroxine (SYNTHROID, LEVOTHROID) 25 MCG tablet Take 25 mcg by mouth daily.    . metFORMIN (GLUCOPHAGE-XR) 500 MG 24 hr tablet Take 2 tablets by mouth 2 (two) times daily.     . ondansetron (ZOFRAN) 4 MG tablet Take 1 tablet (4 mg total) by mouth 3 (three) times daily as needed for nausea or vomiting. 30 tablet 5  . pantoprazole (PROTONIX) 40 MG tablet Take 40 mg by mouth daily.    . Pirfenidone (ESBRIET) 267 MG TABS Take 3 tablets (801 mg total) by mouth with breakfast, with lunch, and with evening meal. 270 tablet 1  . predniSONE (DELTASONE) 1 MG tablet Take 1 mg by mouth in the morning, at noon, and at bedtime.     . Tiotropium Bromide Monohydrate (SPIRIVA RESPIMAT) 2.5 MCG/ACT AERS Inhale 2 puffs into the lungs daily. 4 g 0   No current facility-administered medications for this visit.    Allergies:   Bactrim [sulfamethoxazole-trimethoprim]    ROS:  Please see the history  of present illness.   Otherwise, review of systems are positive for none .   All other systems are reviewed and negative.    PHYSICAL EXAM: VS:  BP 132/70   Pulse 70   Ht _0  (1.676 m)   Wt 172 lb 12.8 oz (78.4 kg)   SpO2 92%   BMI 27.89 kg/m  , BMI Body mass index is 27.89 kg/m. GENERAL:  Well appearing NECK:  No jugular venous distention, waveform within normal limits, carotid upstroke brisk and symmetric, no bruits, no thyromegaly LUNGS:  Clear to auscultation bilaterally CHEST: Diffuse coarse crackles HEART:  PMI not displaced or sustained,S1 and S2 within normal limits, no S3, no S4, no clicks, no rubs, no murmurs ABD:  Flat, positive bowel sounds normal in frequency in pitch, no bruits, no rebound, no guarding, no midline pulsatile mass, no hepatomegaly, no splenomegaly EXT:  2 plus pulses  throughout, no edema, no cyanosis no clubbing    EKG:  EKG is  ordered today. The ekg ordered today demonstrates sinus rhythm, rate 106, axis rightward, intervals within normal limits, no acute changes.   Recent Labs: 05/26/2019: B Natriuretic Peptide 43.6 05/01/2020: ALT 16; BUN 11; Creatinine, Ser 0.89; Hemoglobin 11.3; Magnesium 1.5; Platelets 270.0; Potassium 3.8; Sodium 141    Lipid Panel No results found for: CHOL, TRIG, HDL, CHOLHDL, VLDL, LDLCALC, LDLDIRECT    Wt Readings from Last 3 Encounters:  05/09/20 172 lb 12.8 oz (78.4 kg)  05/01/20 168 lb 9.6 oz (76.5 kg)  02/29/20 178 lb (80.7 kg)      Other studies Reviewed: Additional studies/ records that were reviewed today include:  Review of the above records demonstrates:  Please see elsewhere in the note.     ASSESSMENT AND PLAN:  CAD: He had this noted on CT.  I am going to send him for right and left heart catheterization with the right heart requested by pulmonary.  I think we need to exclude obstructive coronary disease as well with his dyspnea on exertion.  [name] also going to get an echocardiogram to further evaluate left ventricular function although I do not strongly suspect this or valvular abnormalities.  AORTIC ATHEROSCLEROSIS: His diabetes is managed well.  Is not smoking cigarettes.  LDL is at target.  No change in therapy.  INTERSTITIAL LUNG DISEASE/UIP:  He has been sent for work up for right heart cath.   [name] being managed as above.  I will be checking the right heart cath pressures as above.   Current medicines are reviewed at length with the patient today.  The patient does not have concerns regarding medicines.  The following changes have been made:  None  Labs/ tests ordered today include:   Orders Placed This Encounter  Procedures  . CBC with Differential/Platelet  . Basic metabolic panel  . EKG 12-Lead     Disposition:   FU with me as needed based on the results of the  catheterization.   Signed, Minus Breeding, MD  05/09/2020 10:49 AM    [facility]

## 2020-05-09 ENCOUNTER — Ambulatory Visit: Payer: Medicare Other | Admitting: Cardiology

## 2020-05-09 ENCOUNTER — Other Ambulatory Visit: Payer: Self-pay

## 2020-05-09 ENCOUNTER — Encounter: Payer: Self-pay | Admitting: Cardiology

## 2020-05-09 VITALS — BP 132/70 | HR 70 | Ht 66.0 in | Wt 172.8 lb

## 2020-05-09 DIAGNOSIS — I2584 Coronary atherosclerosis due to calcified coronary lesion: Secondary | ICD-10-CM

## 2020-05-09 DIAGNOSIS — I700 Atherosclerosis of aorta: Secondary | ICD-10-CM | POA: Diagnosis not present

## 2020-05-09 DIAGNOSIS — Z01812 Encounter for preprocedural laboratory examination: Secondary | ICD-10-CM

## 2020-05-09 DIAGNOSIS — J84112 Idiopathic pulmonary fibrosis: Secondary | ICD-10-CM

## 2020-05-09 DIAGNOSIS — I2510 Atherosclerotic heart disease of native coronary artery without angina pectoris: Secondary | ICD-10-CM

## 2020-05-09 DIAGNOSIS — J9611 Chronic respiratory failure with hypoxia: Secondary | ICD-10-CM

## 2020-05-09 LAB — BASIC METABOLIC PANEL
BUN/Creatinine Ratio: 7 — ABNORMAL LOW (ref 10–24)
BUN: 7 mg/dL — ABNORMAL LOW (ref 8–27)
CO2: 26 mmol/L (ref 20–29)
Calcium: 9.4 mg/dL (ref 8.6–10.2)
Chloride: 99 mmol/L (ref 96–106)
Creatinine, Ser: 1.02 mg/dL (ref 0.76–1.27)
GFR calc Af Amer: 83 mL/min/{1.73_m2} (ref >59)
GFR calc non Af Amer: 72 mL/min/{1.73_m2} (ref >59)
Glucose: 124 mg/dL — ABNORMAL HIGH (ref 65–99)
Potassium: 4.4 mmol/L (ref 3.5–5.2)
Sodium: 141 mmol/L (ref 134–144)

## 2020-05-09 LAB — CBC WITH DIFFERENTIAL/PLATELET
Basophils Absolute: 0 10*3/uL (ref 0.0–0.2)
Basos: 1 %
EOS (ABSOLUTE): 0.3 10*3/uL (ref 0.0–0.4)
Eos: 7 %
Hematocrit: 38.1 % (ref 37.5–51.0)
Hemoglobin: 12.2 g/dL — ABNORMAL LOW (ref 13.0–17.7)
Immature Grans (Abs): 0 10*3/uL (ref 0.0–0.1)
Immature Granulocytes: 1 %
Lymphocytes Absolute: 0.5 10*3/uL — ABNORMAL LOW (ref 0.7–3.1)
Lymphs: 12 %
MCH: 30.6 pg (ref 26.6–33.0)
MCHC: 32 g/dL (ref 31.5–35.7)
MCV: 96 fL (ref 79–97)
Monocytes Absolute: 0.6 10*3/uL (ref 0.1–0.9)
Monocytes: 14 %
Neutrophils Absolute: 2.6 10*3/uL (ref 1.4–7.0)
Neutrophils: 65 %
Platelets: 270 10*3/uL (ref 150–450)
RBC: 3.99 x10E6/uL — ABNORMAL LOW (ref 4.14–5.80)
RDW: 16.9 % — ABNORMAL HIGH (ref 11.6–15.4)
WBC: 4 10*3/uL (ref 3.4–10.8)

## 2020-05-09 NOTE — Patient Instructions (Addendum)
Medication Instructions:  Your physician recommends that you continue on your current medications as directed. Please refer to the Current Medication list given to you today.  Lab Work: CBC/BMET TODAY   If you have labs (blood work) drawn today and your tests are completely normal, you will receive your results only by: [name] Kitchen MyChart Message (if you have MyChart) OR . A paper copy in the mail If you have any lab test that is abnormal or we need to change your treatment, we will call you to review the results.   Testing/Procedures: Your physician has requested that you have a cardiac catheterization. Cardiac catheterization is used to diagnose and/or treat various heart conditions. Doctors may recommend this procedure for a number of different reasons. The most common reason is to evaluate chest pain. Chest pain can be a symptom of coronary artery disease (CAD), and cardiac catheterization can show whether plaque is narrowing or blocking your heart's arteries. This procedure is also used to evaluate the valves, as well as measure the blood flow and oxygen levels in different parts of your heart. For further information please visit [email or web address]. Please follow instruction sheet, as given.  Follow-Up: WILL DEPEND ON CATH RESULTS   Other Instructions     [place] [facility] [address] [address] Dept: [phone] Loc: [place]  05/09/2020  You are scheduled for a Cardiac Catheterization on Tuesday, July 20 with Dr. Peter Martinique.  1. Please arrive at the [facility] (Main Entrance A) at [facility]: [address] [place], [place] [address] at 7:00 AM (This time is two hours before your procedure to ensure your preparation). Free valet parking service is available.   Special note: Every effort is made to have your procedure done on time. Please understand that  emergencies sometimes delay scheduled procedures.  2. Diet: Do not eat solid foods after midnight.  The patient may have clear liquids until 5am upon the day of the procedure.  3. Labs: You will need to have blood drawn on TODAY   4. Medication instructions in preparation for your procedure:   Contrast Allergy: No  DO NOT TAKE YOUR METFORMIN MORNING OF THE CATH AND HOLD FOR 48 HOURS AFTERWARDS  NO INSULIN MORNING OF CATH   On the morning of your procedure, take your Aspirin and any morning medicines NOT listed above.  You may use sips of water.  5. Plan for one night stay--bring personal belongings. 6. Bring a current list of your medications and current insurance cards. 7. You MUST have a responsible person to drive you home. 8. Someone MUST be with you the first 24 hours after you arrive home or your discharge will be delayed. 9. Please wear clothes that are easy to get on and off and wear slip-on shoes.  Thank you for allowing [place] to care for you!   -- [facility]

## 2020-05-13 MED FILL — ESBRIET 267 MG TABS: 267 | 30 days supply | Qty: 270 | Fill #0

## 2020-05-20 ENCOUNTER — Telehealth: Payer: Self-pay | Admitting: *Deleted

## 2020-05-20 NOTE — Telephone Encounter (Signed)
Pt contacted pre-catheterization scheduled at [facility] for: Tuesday May 21, 2020 9 AM Verified arrival time and place: [facility]) at: 7 AM   No solid food after midnight prior to cath, clear liquids until 5 AM day of procedure.  Hold: Insulin-AM of procedure/1/2 usual HS dose  Metformin-day of procedure and 48 hours post procedure.   Except hold medications AM meds can be  taken pre-cath with sips of water including: ASA 81 mg   Confirmed patient has responsible adult to drive home post procedure and observe 24 hours after arriving home: yes  You are allowed ONE visitor in the waiting room during your procedure. Both you and your visitor must wear a mask once you enter the hospital.      COVID-19 Pre-Screening Questions:  . In the past 14 days have you had a new cough associated with shortness of breath, fever (100.4 or greater) or sudden loss of taste or sense of smell? no . In the past 14 days have you been around anyone with known Covid 19? no . International travel in the past 14 days? no . Have you been vaccinated for COVID-19? Yes, see immunization history  Reviewed procedure/mask/visitor instructions, COVID-19 screening questions with patient.

## 2020-05-21 ENCOUNTER — Encounter (HOSPITAL_COMMUNITY)
Admission: RE | Disposition: A | Discharge status: Home / Self Care | Payer: Self-pay | Source: Home / Self Care | Attending: Cardiology

## 2020-05-21 ENCOUNTER — Other Ambulatory Visit: Payer: Self-pay

## 2020-05-21 ENCOUNTER — Ambulatory Visit (HOSPITAL_COMMUNITY)
Admission: RE | Admit: 2020-05-21 | Discharge: 2020-05-21 | Disposition: A | Discharge status: Home / Self Care | Payer: Medicare Other | Attending: Cardiology | Admitting: Cardiology

## 2020-05-21 ENCOUNTER — Encounter (HOSPITAL_COMMUNITY): Payer: Self-pay | Admitting: Cardiology

## 2020-05-21 DIAGNOSIS — Z7982 Long term (current) use of aspirin: Secondary | ICD-10-CM | POA: Diagnosis not present

## 2020-05-21 DIAGNOSIS — Z881 Allergy status to other antibiotic agents status: Secondary | ICD-10-CM | POA: Insufficient documentation

## 2020-05-21 DIAGNOSIS — I10 Essential (primary) hypertension: Secondary | ICD-10-CM | POA: Diagnosis present

## 2020-05-21 DIAGNOSIS — R0602 Shortness of breath: Secondary | ICD-10-CM | POA: Insufficient documentation

## 2020-05-21 DIAGNOSIS — I2584 Coronary atherosclerosis due to calcified coronary lesion: Secondary | ICD-10-CM | POA: Diagnosis present

## 2020-05-21 DIAGNOSIS — Z79890 Hormone replacement therapy (postmenopausal): Secondary | ICD-10-CM | POA: Diagnosis not present

## 2020-05-21 DIAGNOSIS — J84112 Idiopathic pulmonary fibrosis: Secondary | ICD-10-CM | POA: Insufficient documentation

## 2020-05-21 DIAGNOSIS — M069 Rheumatoid arthritis, unspecified: Secondary | ICD-10-CM | POA: Diagnosis present

## 2020-05-21 DIAGNOSIS — Z79899 Other long term (current) drug therapy: Secondary | ICD-10-CM | POA: Diagnosis not present

## 2020-05-21 DIAGNOSIS — E119 Type 2 diabetes mellitus without complications: Secondary | ICD-10-CM | POA: Insufficient documentation

## 2020-05-21 DIAGNOSIS — Z794 Long term (current) use of insulin: Secondary | ICD-10-CM | POA: Diagnosis not present

## 2020-05-21 DIAGNOSIS — I700 Atherosclerosis of aorta: Secondary | ICD-10-CM | POA: Insufficient documentation

## 2020-05-21 DIAGNOSIS — E079 Disorder of thyroid, unspecified: Secondary | ICD-10-CM | POA: Insufficient documentation

## 2020-05-21 DIAGNOSIS — J449 Chronic obstructive pulmonary disease, unspecified: Secondary | ICD-10-CM | POA: Diagnosis not present

## 2020-05-21 DIAGNOSIS — I2510 Atherosclerotic heart disease of native coronary artery without angina pectoris: Secondary | ICD-10-CM | POA: Diagnosis not present

## 2020-05-21 DIAGNOSIS — J8410 Pulmonary fibrosis, unspecified: Secondary | ICD-10-CM | POA: Diagnosis present

## 2020-05-21 HISTORY — PX: RIGHT/LEFT HEART CATH AND CORONARY ANGIOGRAPHY: CATH118266

## 2020-05-21 LAB — POCT I-STAT EG7
Acid-Base Excess: 3 mmol/L — ABNORMAL HIGH (ref 0.0–2.0)
Acid-Base Excess: 2 mmol/L (ref 0.0–2.0)
Bicarbonate: 30.3 mmol/L — ABNORMAL HIGH (ref 20.0–28.0)
Bicarbonate: 29.3 mmol/L — ABNORMAL HIGH (ref 20.0–28.0)
Calcium, Ion: 1.19 mmol/L (ref 1.15–1.40)
Calcium, Ion: 1.14 mmol/L — ABNORMAL LOW (ref 1.15–1.40)
HCT: 36 % — ABNORMAL LOW (ref 39.0–52.0)
HCT: 35 % — ABNORMAL LOW (ref 39.0–52.0)
Hemoglobin: 12.2 g/dL — ABNORMAL LOW (ref 13.0–17.0)
Hemoglobin: 11.9 g/dL — ABNORMAL LOW (ref 13.0–17.0)
O2 Saturation: 84 %
O2 Saturation: 86 %
Potassium: 4.3 mmol/L (ref 3.5–5.1)
Potassium: 4.3 mmol/L (ref 3.5–5.1)
Sodium: 143 mmol/L (ref 135–145)
Sodium: 142 mmol/L (ref 135–145)
TCO2: 32 mmol/L (ref 22–32)
TCO2: 31 mmol/L (ref 22–32)
pCO2, Ven: 59.1 mmHg (ref 44.0–60.0)
pCO2, Ven: 57.8 mmHg (ref 44.0–60.0)
pH, Ven: 7.318 (ref 7.250–7.430)
pH, Ven: 7.313 (ref 7.250–7.430)
pO2, Ven: 55 mmHg — ABNORMAL HIGH (ref 32.0–45.0)
pO2, Ven: 57 mmHg — ABNORMAL HIGH (ref 32.0–45.0)

## 2020-05-21 LAB — POCT I-STAT 7, (LYTES, BLD GAS, ICA,H+H)
Acid-Base Excess: 2 mmol/L (ref 0.0–2.0)
Bicarbonate: 29.3 mmol/L — ABNORMAL HIGH (ref 20.0–28.0)
Calcium, Ion: 1.17 mmol/L (ref 1.15–1.40)
HCT: 36 % — ABNORMAL LOW (ref 39.0–52.0)
Hemoglobin: 12.2 g/dL — ABNORMAL LOW (ref 13.0–17.0)
O2 Saturation: 99 %
Potassium: 4.3 mmol/L (ref 3.5–5.1)
Sodium: 142 mmol/L (ref 135–145)
TCO2: 31 mmol/L (ref 22–32)
pCO2 arterial: 54.7 mmHg — ABNORMAL HIGH (ref 32.0–48.0)
pH, Arterial: 7.336 — ABNORMAL LOW (ref 7.350–7.450)
pO2, Arterial: 178 mmHg — ABNORMAL HIGH (ref 83.0–108.0)

## 2020-05-21 LAB — GLUCOSE, CAPILLARY: Glucose-Capillary: 124 mg/dL — ABNORMAL HIGH (ref 70–99)

## 2020-05-21 SURGERY — RIGHT/LEFT HEART CATH AND CORONARY ANGIOGRAPHY
Anesthesia: LOCAL

## 2020-05-21 MED ORDER — SODIUM CHLORIDE 0.9 % IV SOLN: 250.0000 mL | INTRAVENOUS | Status: DC | PRN

## 2020-05-21 MED ORDER — METFORMIN HCL ER 500 MG PO TB24: 1000.0000 mg | ORAL_TABLET | Freq: Two times a day (BID) | ORAL | Status: DC

## 2020-05-21 MED ORDER — LIDOCAINE HCL (PF) 1 % IJ SOLN
INTRAMUSCULAR | Status: AC
  Filled 2020-05-21: qty 30

## 2020-05-21 MED ORDER — SODIUM CHLORIDE 0.9% FLUSH: 3.0000 mL | Freq: Two times a day (BID) | INTRAVENOUS | Status: DC

## 2020-05-21 MED ORDER — SODIUM CHLORIDE 0.9 % WEIGHT BASED INFUSION: 1.0000 mL/kg/h | INTRAVENOUS | Status: DC

## 2020-05-21 MED ORDER — ASPIRIN 81 MG PO CHEW: 81.0000 mg | CHEWABLE_TABLET | ORAL | Status: DC

## 2020-05-21 MED ORDER — FENTANYL CITRATE (PF) 100 MCG/2ML IJ SOLN
INTRAMUSCULAR | Status: AC
  Filled 2020-05-21: qty 2

## 2020-05-21 MED ORDER — ONDANSETRON HCL 4 MG/2ML IJ SOLN: 4.0000 mg | Freq: Four times a day (QID) | INTRAMUSCULAR | Status: DC | PRN

## 2020-05-21 MED ORDER — HEPARIN (PORCINE) IN NACL 1000-0.9 UT/500ML-% IV SOLN
INTRAVENOUS | Status: AC
  Filled 2020-05-21: qty 1000

## 2020-05-21 MED ORDER — HEPARIN (PORCINE) IN NACL 1000-0.9 UT/500ML-% IV SOLN
INTRAVENOUS | Status: DC | PRN
  Administered 2020-05-21 (×2): 500 mL

## 2020-05-21 MED ORDER — MIDAZOLAM HCL 2 MG/2ML IJ SOLN
INTRAMUSCULAR | Status: DC | PRN
  Administered 2020-05-21: 1 mg via INTRAVENOUS

## 2020-05-21 MED ORDER — SODIUM CHLORIDE 0.9% FLUSH: 3.0000 mL | INTRAVENOUS | Status: DC | PRN

## 2020-05-21 MED ORDER — IOHEXOL 350 MG/ML SOLN
INTRAVENOUS | Status: DC | PRN
  Administered 2020-05-21: 90 mL

## 2020-05-21 MED ORDER — ACETAMINOPHEN 325 MG PO TABS: 650.0000 mg | ORAL_TABLET | ORAL | Status: DC | PRN

## 2020-05-21 MED ORDER — FENTANYL CITRATE (PF) 100 MCG/2ML IJ SOLN
INTRAMUSCULAR | Status: DC | PRN
  Administered 2020-05-21: 25 ug via INTRAVENOUS

## 2020-05-21 MED ORDER — VERAPAMIL HCL 2.5 MG/ML IV SOLN
INTRAVENOUS | Status: DC | PRN
  Administered 2020-05-21: 10 mL via INTRA_ARTERIAL

## 2020-05-21 MED ORDER — VERAPAMIL HCL 2.5 MG/ML IV SOLN
INTRAVENOUS | Status: AC
  Filled 2020-05-21: qty 2

## 2020-05-21 MED ORDER — SODIUM CHLORIDE 0.9 % WEIGHT BASED INFUSION
3.0000 mL/kg/h | INTRAVENOUS | Status: AC
  Administered 2020-05-21: 3 mL/kg/h via INTRAVENOUS

## 2020-05-21 MED ORDER — HEPARIN SODIUM (PORCINE) 1000 UNIT/ML IJ SOLN
INTRAMUSCULAR | Status: DC | PRN
  Administered 2020-05-21: 4000 [IU] via INTRAVENOUS

## 2020-05-21 MED ORDER — LIDOCAINE HCL (PF) 1 % IJ SOLN
INTRAMUSCULAR | Status: DC | PRN
  Administered 2020-05-21 (×2): 2 mL

## 2020-05-21 MED ORDER — MIDAZOLAM HCL 2 MG/2ML IJ SOLN
INTRAMUSCULAR | Status: AC
  Filled 2020-05-21: qty 2

## 2020-05-21 MED ORDER — HEPARIN SODIUM (PORCINE) 1000 UNIT/ML IJ SOLN
INTRAMUSCULAR | Status: AC
  Filled 2020-05-21: qty 1

## 2020-05-21 SURGICAL SUPPLY — 13 items
CATH 5FR JL3.5 JR4 ANG PIG MP (CATHETERS) ×1 IMPLANT
CATH SWAN DBL LUMAN 5F 110 (CATHETERS) ×2 IMPLANT
CATH SWAN GANZ 7F STRAIGHT (CATHETERS) IMPLANT
DEVICE RAD COMP TR BAND LRG (VASCULAR PRODUCTS) ×1 IMPLANT
GLIDESHEATH SLEND SS 6F .021 (SHEATH) ×2 IMPLANT
GLIDESHEATH SLENDER 7FR .021G (SHEATH) IMPLANT
GUIDEWIRE .025 260CM (WIRE) ×1 IMPLANT
GUIDEWIRE INQWIRE 1.5J.035X260 (WIRE) ×1 IMPLANT
INQWIRE 1.5J .035X260CM (WIRE) ×2
KIT HEART LEFT (KITS) ×2 IMPLANT
PACK CARDIAC CATHETERIZATION (CUSTOM PROCEDURE TRAY) ×2 IMPLANT
TRANSDUCER W/STOPCOCK (MISCELLANEOUS) ×2 IMPLANT
TUBING CIL FLEX 10 FLL-RA (TUBING) ×2 IMPLANT

## 2020-05-21 NOTE — Discharge Instructions (Signed)
Radial Site Care  This sheet gives you information about how to care for yourself after your procedure. Your health care provider may also give you more specific instructions. If you have problems or questions, contact your health care provider. What can I expect after the procedure? After the procedure, it is common to have:  Bruising and tenderness at the catheter insertion area. Follow these instructions at home: Medicines  Take over-the-counter and prescription medicines only as told by your health care provider. Insertion site care  Follow instructions from your health care provider about how to take care of your insertion site. Make sure you: ? Wash your hands with soap and water before you change your bandage (dressing). If soap and water are not available, use hand sanitizer. ? Change your dressing as told by your health care provider. ? Leave stitches (sutures), skin glue, or adhesive strips in place. These skin closures may need to stay in place for 2 weeks or longer. If adhesive strip edges start to loosen and curl up, you may trim the loose edges. Do not remove adhesive strips completely unless your health care provider tells you to do that.  Check your insertion site every day for signs of infection. Check for: ? Redness, swelling, or pain. ? Fluid or blood. ? Pus or a bad smell. ? Warmth.  Do not take baths, swim, or use a hot tub until your health care provider approves.  You may shower 24-48 hours after the procedure, or as directed by your health care provider. ? Remove the dressing and gently wash the site with plain soap and water. ? Pat the area dry with a clean towel. ? Do not rub the site. That could cause bleeding.  Do not apply powder or lotion to the site. Activity   For 24 hours after the procedure, or as directed by your health care provider: ? Do not flex or bend the affected arm. ? Do not push or pull heavy objects with the affected arm. ? Do not  drive yourself home from the hospital or clinic. You may drive 24 hours after the procedure unless your health care provider tells you not to. ? Do not operate machinery or power tools.  Do not lift anything that is heavier than 10 lb (4.5 kg), or the limit that you are told, until your health care provider says that it is safe.  Ask your health care provider when it is okay to: ? Return to work or school. ? Resume usual physical activities or sports. ? Resume sexual activity. General instructions  If the catheter site starts to bleed, raise your arm and put firm pressure on the site. If the bleeding does not stop, get help right away. This is a medical emergency.  If you went home on the same day as your procedure, a responsible adult should be with you for the first 24 hours after you arrive home.  Keep all follow-up visits as told by your health care provider. This is important. Contact a health care provider if:  You have a fever.  You have redness, swelling, or yellow drainage around your insertion site. Get help right away if:  You have unusual pain at the radial site.  The catheter insertion area swells very fast.  The insertion area is bleeding, and the bleeding does not stop when you hold steady pressure on the area.  Your arm or hand becomes pale, cool, tingly, or numb. These symptoms may represent a serious problem  that is an emergency. Do not wait to see if the symptoms will go away. Get medical help right away. Call your local emergency services (911 in the [place] Do not drive yourself to the hospital. Summary  After the procedure, it is common to have bruising and tenderness at the site.  Follow instructions from your health care provider about how to take care of your radial site wound. Check the wound every day for signs of infection.  Do not lift anything that is heavier than 10 lb (4.5 kg), or the limit that you are told, until your health care provider says  that it is safe. This information is not intended to replace advice given to you by your health care provider. Make sure you discuss any questions you have with your health care provider. Document Revised: 11/24/2017 Document Reviewed: 11/24/2017 Elsevier Patient Education  2020 [employer].

## 2020-05-21 NOTE — Interval H&P Note (Signed)
History and Physical Interval Note:  05/21/2020 9:48 AM  [name]  has presented today for surgery, with the diagnosis of dypsnea , coronary artery disease.  The various methods of treatment have been discussed with the patient and family. After consideration of risks, benefits and other options for treatment, the patient has consented to  Procedure(s): RIGHT/LEFT HEART CATH AND CORONARY ANGIOGRAPHY (N/A) as a surgical intervention.  The patient's history has been reviewed, patient examined, no change in status, stable for surgery.  I have reviewed the patient's chart and labs.  Questions were answered to the patient's satisfaction.    Cath Lab Visit (complete for each Cath Lab visit)  Clinical Evaluation Leading to the Procedure:   ACS: No.  Non-ACS:    Anginal Classification: CCS III  Anti-ischemic medical therapy: No Therapy  Non-Invasive Test Results: No non-invasive testing performed  Prior CABG: No previous CABG       [name] [facility] 05/21/2020 9:48 AM

## 2020-05-22 ENCOUNTER — Telehealth: Payer: Self-pay | Admitting: Internal Medicine

## 2020-05-22 NOTE — Telephone Encounter (Signed)
Please advise if patient still needs to have ECHO done. Pt had cardiac cath on yesterday. Patient inquiring?

## 2020-05-24 NOTE — Telephone Encounter (Signed)
That is a qusestion to his cardiologist - he has an echo scheduled on 06/25/20 . I think he should keep it though it is a question to his cardiologist  Regarding his righ heart cath - shows elevation in Pulm art pressures - he might qualify for inhaled tyvaso. Will discuss with him this month at followup

## 2020-05-24 NOTE — Telephone Encounter (Signed)
Spoke with the pt and notified of recs per MR and he verbalized understanding

## 2020-05-27 ENCOUNTER — Other Ambulatory Visit (HOSPITAL_COMMUNITY): Payer: Medicare Other

## 2020-05-30 ENCOUNTER — Other Ambulatory Visit: Payer: Self-pay

## 2020-05-30 ENCOUNTER — Ambulatory Visit: Payer: Medicare Other | Admitting: Internal Medicine

## 2020-05-30 ENCOUNTER — Encounter: Payer: Self-pay | Admitting: Internal Medicine

## 2020-05-30 VITALS — BP 130/62 | HR 107 | Ht 66.0 in | Wt 172.6 lb

## 2020-05-30 DIAGNOSIS — J8410 Pulmonary fibrosis, unspecified: Secondary | ICD-10-CM | POA: Diagnosis not present

## 2020-05-30 DIAGNOSIS — Z5181 Encounter for therapeutic drug level monitoring: Secondary | ICD-10-CM

## 2020-05-30 LAB — HEPATIC FUNCTION PANEL
ALT: 12 U/L (ref 0–53)
AST: 20 U/L (ref 0–37)
Albumin: 3.9 g/dL (ref 3.5–5.2)
Alkaline Phosphatase: 46 U/L (ref 39–117)
Bilirubin, Direct: 0.1 mg/dL (ref 0.0–0.3)
Total Bilirubin: 0.3 mg/dL (ref 0.2–1.2)
Total Protein: 7.4 g/dL (ref 6.0–8.3)

## 2020-05-30 NOTE — Progress Notes (Signed)
OV 01/01/2017  Chief Complaint  Patient presents with  . Follow-up    Pt here after PFT. Pt states overall his breathing is unchanged since last OV. Pt c/o occ dry cough. Pt deneis CP/tightness and f/c/s.    Follow-up interstitial lung disease UIP pattern associated with rheumatoid arthritis and immune suppression. Last visit in March 2017. He doesn't that overall he has been stable. He has a new rheumatologist but still at [facility] [name]. His wife also says he is stable. However as he started talking to them his wife slowly started telling me that for the last few to several months he is wheezing more than usual and is a little more short of breath than usual. However he denied it. This is compliant with his medications. [name] not on any inhaler therapy. He uses oxygen only at night. Previous CT scans of the chest in September 2016 in March 2017 did not report any associated emphysema. His most recent pulmonary function test is shown that on Combivent. I personally visualized the graph: [name] Kitchen Forced vital capacity is stable with a diffusion capacity seems to decline over time. Walking desaturation test 185 feet 3 laps on room air: He desaturated to 84%. And then we walked him on 2 L oxygen he did not desaturate.   OV 06/23/2017  Chief Complaint  Patient presents with  . Follow-up    pt here after CT in 04/2017. Pt states his breathing is unchanged since last OV. Pt c/o interemittent dry cough. Pt denies CP/tightness and f/c/s.      Follow-up interstitial lung disease UIP pattern associated with rheumatoid arthritis and on Imuran. Last visit in March 2018 I was concerned about progressive interstitial lung disease because he started desaturating on room air and his FVC show decline. Currently rated a high-resolution CT chest that confirmed progressive lung disease. We walked him 185 feet 3 laps on room air and he desaturated at third lap to 89%. This is very  similar to previous visit suggesting that  that his decline has been more gradual over the last 1 year. Compared to last visit he feels stable. His wife feels the same. [name] aware of his worsening fibrosis and the potential need to change anti fibrotics  OV 09/27/2017  Chief Complaint  Patient presents with  . Follow-up    PFT done today. Pt states that he thinks his breathing has become worse. Has complaints of SOB with exertion, occ. dry cough. Denies any CP. States that he does wear O2 at night, DME: [facility].     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  Patient is on Imuran.  [name] having progressive disease this year 2018.  He told my certified medical assistant that he is having progressive shortness of breath but to me he tells me that he is stable.  As always he is a very poor historian.  Wife gives a better history.  He has oxygen at home for the night which she uses but does not use his exertional small portable system even though this helps him.  They do admit that dyspnea is progressive.  In fact pulmonary function test today shows progressive disease.  He also seem to desaturate a little bit more easily.  He continues to be on Imuran.  In talking to him it appears his rheumatologist has now started him on Rituxan.  He has had 2 doses 2 weeks apart with the most  recent one being mid November 2018.  The next dose is at mid May 2019 which is 51-monthinterval.  He says he is up-to-date with his flu shot but is not had a conversation with his primary care physician about the new shingles vaccine that is inactivated vaccine.  [name] not having much of joint issues.  [name] aware that the Rituxan is specifically designed to slow down the progression of pulmonary fibrosis.  Walking desaturation test 185 feet x3 laps on room air with a forehead probe.  Resting heart rate was 95/min.  Final heart rate 108/min.  Resting pulse ox was 95%.  By the time he  finished his second lap he desaturated to 84%.  He was then placed on 2 L nasal cannula and he was able to maintain his saturation to 95%.   OV 12/20/2017  Chief Complaint  Patient presents with  . Follow-up    follow up PFT,SOB with activity/exertion   [name] is here for follow-up because of his progressive UIP pattern pulmonary fibrosis related to rheumatoid arthritis.  At his last visit he was started on Rituxan by rheumatology.  Since then we started him on portable oxygen but he says he is not using it because he does not like to use it.  He does use oxygen at night which is helping.  His next dose of Rituxan is in May 2019.  Both he and his wife attest that since the start of Rituxan his symptoms are stable particularly shortness of breath is not any worse.  In fact pulmonary function test today shows continued ongoing stability with FVC and DLCO would suggest that the Rituxan might be helping him.  There are no new issues.  [name] here to have a shingles vaccine.  I told him about this last visit to talk to primary care physician but he does not remember this.   IMPRESSION: June 2018 1. Continued interval progression of fibrotic interstitial lung disease with extensive honeycombing, asymmetrically involving the right lung, with mild basilar predominance, diagnostic of usual interstitial pneumonia (UIP) . 2. Stable mild reactive mediastinal lymphadenopathy. 3. Aortic atherosclerosis.  One vessel coronary atherosclerosis.   Electronically Signed   By: [name]   On: 04/09/2017 10:51   OV 08/01/2018  Subjective:  Patient ID: [name] , DOB: [birth year], age 74y.o. , MRN: [id number], ADDRESS: [address] [place] [id number]  08/01/2018 -   Chief Complaint  Patient presents with  . Follow-up    PFT today, increased coughing (non-productive)    Follow-up interstitial lung disease UIP pattern associated with rheumatoid arthritis. Rx Rituxan since approx  Oct/Nov 2018. Background Immuran  HPI LKeyandreScales 74y.o. -presents as usual with his wife.  He tells me that he continues on background Imuran.  [name] also on scheduled Rituxan.  He has an infusion coming up in a month or so.  This is according to his wife.  He continues to be a poor historian.  As best as I can gather from both of them is that his shortness of breath is actually a little bit better ever since starting Rituxan..  In fact his PFTs show continued improvement/stability.  With his FVC and DLCO.  However he tells me that his cough might be a little bit worse.  In talking to him it appears that is only mildly worse.  It happens mostly at night when he lies down.  He coughs a  few times and then it does not bother him.  The cough is dry.  He denies any postnasal drip or acid reflux disease.  He does not have any fever or sputum production.       OV 10/06/2018  Subjective:  Patient ID: Jabes Primo, male , DOB: Jul 07, 1946 , age 29 y.o. , MRN: [id number] , ADDRESS: [address] [place] [address]   10/06/2018 -   Chief Complaint  Patient presents with  . Follow-up    f/u pulmonary fibrosis, no symptoms, complaints, breathing doind well     HPI Mikkel Huneke 74 y.o. -returns for ILD follow-up in the setting of rheumatoid arthritis.  This time he is not here with his wife who has other schedule conflicts.  He tells me overall he stable.  He has now started Rituxan.  As again he is a poor historian.  I have him starting Rituxan a year ago but he told me that it was only started recently and has had only 1 infusion.  He tells me that he is stable.  His cough is well controlled with this anticholinergic he is using nighttime oxygen.  He says he is compliant with his medications.  Last visit I told him to inquire about using Bactrim prophylaxis in the setting of B-cell depletion in the setting of Rituxan.  He has no idea recollection of this conversation.  So I did tell him that we will check  his G6PD and discuss directly with his rheumatologist about starting Bactrim prophylaxis.  [name] okay with this plan.  Overall dyspnea and cough are stable and it is mild.  Walking desaturation test shows stability.     OV 06/29/2019  Subjective:  Patient ID: Terron Merfeld, male , DOB: [birth year] , age 59 y.o. , MRN: [id number] , ADDRESS: [address] [place] [address] [address]   Follow-up interstitial lung disease UIP pattern associated with rheumatoid arthritis. Rx Rituxan since approx Oct/Nov 2018. On Background Immuran, prednisone and dapsone for pcp prophylaxois = high risk medication use   06/29/2019 - ILD- RA  followup    HPI Divon Sudbury 74 y.o. -presents for follow-up.  I personally saw him December 2019 before the onset of the pandemic.  He tells me that he is doing fair.  He feels he is overall stable.  He continues to be on Rituxan.  He tells me the last dose of this infusion was in May 05, 2019.  This is managed by rheumatology.  In addition he is on Imuran, prednisone.  Also by middle of year my nurse practitioner started him on dapsone for PCP prophylaxis.  He did have a high-resolution CT chest that shows UIP findings and stability since 2018.  However, he did desaturate with exertion when he saw the nurse practitioner just over a month ago.  This is a new finding as documented below.  We walked him again today and again found his desaturation with exertion.  It required 5 L to correct.  In past he has done well with RA at times but at times required 2L. He denies any fever or sputum production.  His previous echocardiogram was in 2013.  Most recent CT scan of the chest notes coronary artery calcification   Noted WBC going down after dapsine start Results for JAEGER, TRUEHEART (MRN [id number]) as of 06/29/2019 10:19  Ref. Range 05/30/2019 10:17 06/07/2019 09:07 06/13/2019 08:59 06/20/2019 09:50  WBC Latest Ref Range: 4.0 - 10.5 K/uL 8.8 6.2 4.3 3.4 (L)  IMPRESSION: HRCT July 2020 1. No  significant interval change in severe pulmonary fibrosis, which is heterogeneously distributed and most severe in the right lung base, featuring extensive honeycombing and mild traction bronchiectasis. Although findings are not significantly changed compared to most recent examination dated 2018, they are clearly progressed over time in comparison to priors dating back to 2013. Findings remain in a UIP pattern by ATS pulmonary fibrosis criteria  2.  Coronary artery disease.   Electronically Signed   By: Eddie Candle [name]   On: 05/12/2019 13:14  ROS - per HPI   OV 01/04/2020  Subjective:  Patient ID: [name], male , DOB: 01/03/1946 , age 26 y.o. , MRN: [id number] , [address] [place] [address]   01/04/2020 -   Chief Complaint  Patient presents with  . Follow-up    Pt states he has been doing good since last visit. States he still has an occ cough.    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 Rx Rituxan since approx Oct/Nov 2018. On Background Immuran, prednisone and  = high risk medication use     HPI Leverne Murry 74 y.o. -returns for follow-up.  Last seen in the summer 2020.  Overall he is doing stable.  He continues with his immunosuppressive medications through rheumatology.  This include Imuran prednisone and Rituxan.  He had his infusion of Rituxan this week.  He feels stable but his pulmonary function test done for this visit shows a decline.  It is documented below his walking desaturation test also is a decline from the past but similar to last visit 6 months or so ago.  His main symptom is chronic cough.  [name] labeled it as a level 3 out of 5 but it appears it might actually be more severe.  [name] asking for palliative relief of cough.  His last liver function test with [place] was in the summer 2020.  It appears per review of the chart in the interim he has seen cardiology Dr. Percival Spanish for his coronary artery  calcification and is on expectant follow-up for that.  No stress test has been recommended because of the lack of chest pain.  His wife who is usually here with him is not here today.  He said she slept in.  [name] on MDI - no emphysema noted in prior CT    ROS - per HPI  OV 02/15/2020  Subjective:  Patient ID: [name], male , DOB: 24-Mar-1946 , age 71 y.o. , MRN: [id number] , ADDRESS: [place] [id number]   02/15/2020 -   Chief Complaint  Patient presents with  . Follow-up    Follow-up interstitial lung disease UIP pattern associated with rheumatoid arthritis.   - Rx Rituxan since approx Oct/Nov 2018. On Background Immuran, prednisone and  = high risk medication use  - started ofev mid -  end of march 2021 for progressive disease    HPI [name] 74 y.o. -he has progressive ILD because of rheumatoid arthritis UIP pattern.  Last visit started him on nintedanib.  [name] now here to follow-up for tolerance and monitoring of nintedanib because this requires intensive therapeutic monitoring.  [name] also immunosuppressed.  He tells me that he is now started nintedanib approximately 1 month ago.  [name] tolerating it fine without any nausea vomiting diarrhea.  Last visit he took the consent [name] for the ILD-pro registry study.  [name]  agreed now to participate in it.  He meets inclusion exclusion criteria because of progressive disease.  At this point in time he is got minimal symptoms.  He desaturated as before.  However he is not compliant with his portable oxygen with exertion.  His cough is improved because of Hycodan.   OV 05/01/2020  Subjective:  Patient ID: [name], male , DOB: 1946-01-08 , age 53 y.o. , MRN: [id number] , ADDRESS: [place] [address]   05/01/2020 -   Chief Complaint  Patient presents with  . Follow-up    pt is here to to disuss ild    OV 05/30/2020   Subjective:  Patient ID: [name], male , DOB:  1946/05/16, age 29 y.o. years. , MRN: [id number],  ADDRESS: [address] [place] [address] PCP  Merrilee Seashore, MD Providers : Treatment Team:  Attending Provider: Brand Males, MD   Chief Complaint  Patient presents with  . Follow-up    Pt states he has been doing good since last visit and states his breathing is about the same.     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   - Rx Rituxan since approx Oct/Nov 2018. On Background Immuran, prednisone and  = high risk medication use  - started ofev mid -  end of march 2021 for progressive disease -> changed to [facility]  June 2021    HPI [name] 74 y.o. -presents for follow-up.  This time he has brought his wife.  Have not seen the wife in a long time.  She appears to have lost weight.  She says she is dealing with her own health issues of unexplained weight loss.  She says she is abreast of his health issues when he returns home after visits.  However patient continues to be stoic and always states he is feeling well.  Last time he said he was feeling well but had high level of symptoms.  This time also he says he is feeling well and gaining weight and has marked very low level of symptoms.  Therefore overall his history is unreliable.  In the interim he has had his right heart catheterization.  He had nonobstructive coronary artery disease his.  His mean pulmonary artery pressure was elevated at 25.  Wedge pressure could not be elucidated.  His peripheral vascular resistance appears greater than 3.  Therefore he qualifies for nebulized treprostinil.  However he still in the uptake with his pirfenidone.  [name] not applying sunscreen and he says he will.  [name] tolerating pirfenidone well.  He has gained weight no nausea no vomiting no diarrhea.  He uses 4 L of pulsed oxygen at rest.  However at room air at rest he was normal.  He desaturated only after walking 2 laps.    SYMPTOM SCALE  - ILD 06/29/2019  01/04/2020  02/15/2020  05/01/2020  05/30/2020   O2 use Uses 3-5L with exertion yuses 3-5L with exertion Non compliant 168# and 4L with exertion - on esbriet 172# on esbriet - 4L at rest  - filled bu him and wife  Shortness of Breath 0 -> 5 scale with 5 being worst (score 6 If unable to do)      At rest 3 0 0 4 0  Simple tasks - showers, clothes change, eating, shaving 2 1 0 5 0  Household (dishes, doing bed, laundry) 2 1 0 5 0  Shopping _0 5  0  Walking level at own pace _0 0  Walking up Stairs _1 Total (40 - 48) Dyspnea Score _2 How bad is your cough? _3 How bad is your fatigue 2 1 0 4 0  nasuea    4 0  vomit    0 0  diarrhea  0 0 0 0  anxiety  0 0 0 0  deipression  0 0 0 0       Simple office walk 08/01/2018 -  185 feet x  3 laps goal with forehead probe at [place]  10/06/2018 250 feet x 3 laps 06/29/2019  01/04/2020  02/15/2020  05/30/2020   O2 used _4  raoom air after turing o2 off fx 5 mn  Number laps completed 3 3 Aim for 3 1 Did only 1 of 3 Did all 3 Did only 2 laps  Comments about pace normal normal  avg pace avg opae avg pagce  Resting Pulse Ox/HR 100% and 80/min 100% and 81/min 94% and 96/min 96% and 101/min 99% and 98/min 96% and 94/min  Final Pulse Ox/HR 94% and 114/min 91% and 111/min 86% at first lap, HR 131 86% and 118/min 88% and 123/min 83% and 125/,n  Desaturated </= 88% no no  yes yes   Desaturated <= 3% points yes yes  yes yes   Got Tachycardic >/= 90/min yes yes  yes yes   Symptoms at end of test x x  Mild dyspnea dyspnea   Miscellaneous comments x x Needed 5L [place] to correct       Results for BRAVERY, KETCHAM (MRN [id number]) as of 01/04/2020 09:36  Ref. Range 01/23/2014 15:38 10/01/2014 11:24 07/29/2015 11:09 12/18/2016 09:34 09/27/2017 09:33 12/20/2017 11:29 08/01/2018 09:39 12/26/2019 08:54 04/08/20  FVC-Pre Latest Units: L 2.18 2.05 2.17 2.21 2.06 2.10 2.09 1.96 1.73    FVC-%Pred-Pre Latest Units: % 66 62 66 68 64 65 69 61 54%   Results for ALWYN, CORDNER (MRN [id number]) as of 01/04/2020 09:36  Ref. Range 01/23/2014 15:38 10/01/2014 11:24 07/29/2015 11:09 12/18/2016 09:34 09/27/2017 09:33 12/20/2017 11:29 08/01/2018 09:39 12/26/2019 08:54 6/7  DLCO unc Latest Units: ml/min/mmHg 17.93 16.86 16.72 13.81 12.14 12.30 13.24 13.14 9/15  DLCO unc % pred Latest Units: % 66 62 62 51 45 45 52 58 41%      has a past medical history of Arthritis, COPD (chronic obstructive pulmonary disease) ([place]), Diabetes mellitus, Hypertension, Pneumonia, and Thyroid disease.   reports that he quit smoking about 35 years ago. His smoking use included cigarettes. He has a 5.00 pack-year smoking history. He has never used smokeless tobacco.  Past Surgical History:  Procedure Laterality Date  . KNEE ARTHROSCOPY    . NO PAST SURGERIES    . RIGHT/LEFT HEART CATH AND CORONARY ANGIOGRAPHY N/A 05/21/2020   Procedure: RIGHT/LEFT HEART CATH AND CORONARY ANGIOGRAPHY;  Surgeon: Martinique, Peter M, MD;  Location: [facility];  Service: Cardiovascular;  Laterality: N/A;    Allergies  Allergen Reactions  . Bactrim [Sulfamethoxazole-Trimethoprim] Shortness Of Breath    Patient states "got short winded"  . Ofev [Nintedanib]     Immunization History  Administered Date(s) Administered  . Influenza Split 08/03/2011, 07/03/2012, 09/02/2013, 07/03/2014  . Influenza, High Dose Seasonal PF 08/02/2016, 08/02/2017, 07/25/2018, 07/04/2019  . Influenza,inj,Quad PF,6+ Mos 07/29/2015  . PFIZER SARS-COV-2 Vaccination 01/13/2020, 02/05/2020  .  Pneumococcal Conjugate-13 08/01/2018  . Pneumococcal Polysaccharide-23 11/03/2007  . Zoster Recombinat (Shingrix) 04/11/2019    Family History  Problem Relation Age of Onset  . Diabetes Mother   . Stroke Mother      Current Outpatient Medications:  .  acetaminophen (TYLENOL) 500 MG tablet, Take 1,000 mg by mouth every 6 (six) hours as needed for moderate  pain or headache., Disp: , Rfl:  .  amLODipine (NORVASC) 10 MG tablet, Take 10 mg by mouth daily., Disp: , Rfl:  .  aspirin 81 MG tablet, Take 81 mg by mouth daily., Disp: , Rfl:  .  atorvastatin (LIPITOR) 20 MG tablet, Take 20 mg by mouth daily., Disp: , Rfl: 2 .  azaTHIOprine (IMURAN) 50 MG tablet, Take 100 mg by mouth daily. , Disp: , Rfl:  .  insulin NPH Human (NOVOLIN N) 100 UNIT/ML injection, Inject 12 Units into the skin daily. , Disp: , Rfl:  .  Insulin Syringe-Needle U-100 (INSULIN SYRINGE .3CC/31GX5/16") 31G X 5/16" 0.3 ML MISC, Inject 1 Syringe into the skin as directed., Disp: , Rfl:  .  levothyroxine (SYNTHROID, LEVOTHROID) 25 MCG tablet, Take 25 mcg by mouth daily., Disp: , Rfl:  .  metFORMIN (GLUCOPHAGE-XR) 500 MG 24 hr tablet, Take 2 tablets (1,000 mg total) by mouth 2 (two) times daily., Disp: , Rfl:  .  ondansetron (ZOFRAN) 4 MG tablet, Take 1 tablet (4 mg total) by mouth 3 (three) times daily as needed for nausea or vomiting., Disp: 30 tablet, Rfl: 5 .  pantoprazole (PROTONIX) 40 MG tablet, Take 40 mg by mouth daily., Disp: , Rfl:  .  Pirfenidone (ESBRIET) 267 MG TABS, Take 3 tablets (801 mg total) by mouth with breakfast, with lunch, and with evening meal., Disp: 270 tablet, Rfl: 1 .  predniSONE (DELTASONE) 1 MG tablet, Take 3 mg by mouth daily. , Disp: , Rfl:  .  Tiotropium Bromide Monohydrate (SPIRIVA RESPIMAT) 2.5 MCG/ACT AERS, Inhale 2 puffs into the lungs daily. (Patient taking differently: Inhale 2 puffs into the lungs daily as needed (shortness of breath). ), Disp: 4 g, Rfl: 0      Objective:   Vitals:   05/30/20 1116  BP: (!) 130/62  Pulse: (!) 107  SpO2: 96%  Weight: 172 lb 9.6 oz (78.3 kg)  Height: _0  (1.676 m)    Estimated body mass index is 27.86 kg/m as calculated from the following:   Height as of this encounter: _1  (1.676 m).   Weight as of this encounter: 172 lb 9.6 oz (78.3 kg).  _2 @  Filed Weights   05/30/20 1116  Weight: 172  lb 9.6 oz (78.3 kg)     Physical Exam Thin male with oxygen on.  Clubbing present rheumatoid arthritis deformities of finger present bilateral Velcro crackles present.  No cyanosis no clubbing no edema.         Assessment:       ICD-10-CM   1. Pulmonary fibrosis (HCC)  J84.10 Pulmonary function test    Hepatic function panel  2. Therapeutic drug monitoring  Z51.81 Hepatic function panel       Plan:     Patient Instructions  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) Chronic respiratory failure with hypoxia (HCC) Therapeutic drug monitoring - weight loss, nausea/vomit with ofev  - I am worried about progression but seems since last visti you are stable -I am so glad to hear that Esbriet is not causing the same side effects that  nintedanib did -I am glad that you are tolerating Esbriet well and that you have gained weight -Your right heart catheterization does show mild pulmonary hypertension -suggesting you will qualify for nebulizer treprostinil  Plan  - continue esbriet  - ensure you wear sunscreen -CMA will mark nintedanib as an allergy -Check liver function test today -Hold off on starting nebulized treprostinil until we can demonstrate several months of stability with Esbriet without any side effects -Continue oxygen at rest and with exertion and at night  Follow-up -3 months do spirometry and DLCO -Return to clinic in 3 months for 30-minute visit     SIGNATURE    Dr. Brand Males, [name], [name],  Pulmonary and Critical Care Medicine Staff Physician, [place] Director - Interstitial Lung Disease  Program  Pulmonary [place] [facility] [place], [place], [address]  Pager: [phone], If no answer or between  15:00h - 7:00h: call [phone] Telephone: [phone]  12:13 PM 05/30/2020

## 2020-05-30 NOTE — Addendum Note (Signed)
Addended by: Suzzanne Cloud E on: 05/30/2020 12:18 PM   Modules accepted: Orders

## 2020-05-30 NOTE — Patient Instructions (Addendum)
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) Chronic respiratory failure with hypoxia (HCC) Therapeutic drug monitoring - weight loss, nausea/vomit with ofev  - I am worried about progression but seems since last visti you are stable -I am so glad to hear that Esbriet is not causing the same side effects that nintedanib did -I am glad that you are tolerating Esbriet well and that you have gained weight -Your right heart catheterization does show mild pulmonary hypertension -suggesting you will qualify for nebulizer treprostinil  Plan  - continue esbriet  - ensure you wear sunscreen -CMA will mark nintedanib as an allergy -Check liver function test today -Hold off on starting nebulized treprostinil until we can demonstrate several months of stability with Esbriet without any side effects -Continue oxygen at rest and with exertion and at night  Follow-up -3 months do spirometry and DLCO -Return to clinic in 3 months for 30-minute visit

## 2020-05-31 NOTE — Progress Notes (Signed)
LFT Normal. Continue esbriet

## 2020-06-02 DIAGNOSIS — J129 Viral pneumonia, unspecified: Secondary | ICD-10-CM | POA: Diagnosis not present

## 2020-06-02 DIAGNOSIS — R0902 Hypoxemia: Secondary | ICD-10-CM | POA: Diagnosis not present

## 2020-06-05 DIAGNOSIS — I10 Essential (primary) hypertension: Secondary | ICD-10-CM | POA: Diagnosis not present

## 2020-06-05 DIAGNOSIS — E1065 Type 1 diabetes mellitus with hyperglycemia: Secondary | ICD-10-CM | POA: Diagnosis not present

## 2020-06-05 DIAGNOSIS — E782 Mixed hyperlipidemia: Secondary | ICD-10-CM | POA: Diagnosis not present

## 2020-06-14 ENCOUNTER — Telehealth: Payer: Self-pay | Admitting: Internal Medicine

## 2020-06-14 NOTE — Telephone Encounter (Signed)
Patient contacted with NP Walsh's recommendation. Patient is going to take benadryl now. RN gave clear instructions about the need for urgent care if he feels any change to his breathing or airway. Plan to call back patient later to day to check on status.

## 2020-06-14 NOTE — Telephone Encounter (Signed)
Stop Esbriet. Take benadryl. If having trouble breathing or hives on face call 911

## 2020-06-14 NOTE — Telephone Encounter (Signed)
Patient contacted, he is unchanged, hives are still present. [name] going to continue to take Benadryl until the hives fade. [name] with his wife and understands the need to call 911 if the hives get worse or there are any changes to his airway. Patient verbalized understanding.[name]

## 2020-06-14 NOTE — Telephone Encounter (Signed)
Patient is reporting hives covering his body. He thinks he is having an allergic reaction to Esbriet. Patient advised to call 911 is he feels any changes to his airway or breathing. Patient reports this started as soon as started Esbriet but is worse today. Patient advised not to take anymore until we contact him. Patient is allergic to Ofev and Bactrim. Esbriet started on 05/08/2020. Please advise

## 2020-06-17 ENCOUNTER — Telehealth: Payer: Self-pay | Admitting: Internal Medicine

## 2020-06-17 NOTE — Telephone Encounter (Signed)
Patient calling because he does not feel Esbriet caused his hives. [name] going back on the medication and will call back with any further concerns.

## 2020-06-19 ENCOUNTER — Ambulatory Visit: Payer: Medicare Other | Admitting: Physician Assistant

## 2020-06-25 ENCOUNTER — Ambulatory Visit (HOSPITAL_COMMUNITY): Payer: Medicare Other | Attending: Cardiology

## 2020-06-25 ENCOUNTER — Other Ambulatory Visit: Payer: Self-pay

## 2020-06-25 DIAGNOSIS — R110 Nausea: Secondary | ICD-10-CM | POA: Diagnosis not present

## 2020-06-25 DIAGNOSIS — J8410 Pulmonary fibrosis, unspecified: Secondary | ICD-10-CM | POA: Insufficient documentation

## 2020-06-25 DIAGNOSIS — I10 Essential (primary) hypertension: Secondary | ICD-10-CM | POA: Insufficient documentation

## 2020-06-25 DIAGNOSIS — Z5181 Encounter for therapeutic drug level monitoring: Secondary | ICD-10-CM | POA: Insufficient documentation

## 2020-06-25 DIAGNOSIS — R634 Abnormal weight loss: Secondary | ICD-10-CM | POA: Diagnosis not present

## 2020-06-25 DIAGNOSIS — R0600 Dyspnea, unspecified: Secondary | ICD-10-CM | POA: Diagnosis not present

## 2020-06-25 DIAGNOSIS — E119 Type 2 diabetes mellitus without complications: Secondary | ICD-10-CM | POA: Diagnosis not present

## 2020-06-25 DIAGNOSIS — R0602 Shortness of breath: Secondary | ICD-10-CM | POA: Insufficient documentation

## 2020-06-25 LAB — ECHOCARDIOGRAM COMPLETE
Area-P 1/2: 2.66 cm2
S' Lateral: 3.1 cm

## 2020-06-25 NOTE — Progress Notes (Signed)
Heart pump  is ok. Right side (lung side) pressures are up but he had right heart cath and plan for that wa sgiven at last visit

## 2020-06-25 NOTE — Progress Notes (Signed)
Called and spoke with patient about echo results per Dr Chase Caller. All questions answered, nothing further needed at this time.

## 2020-07-03 DIAGNOSIS — J129 Viral pneumonia, unspecified: Secondary | ICD-10-CM | POA: Diagnosis not present

## 2020-07-03 DIAGNOSIS — R0902 Hypoxemia: Secondary | ICD-10-CM | POA: Diagnosis not present

## 2020-07-11 DIAGNOSIS — M0589 Other rheumatoid arthritis with rheumatoid factor of multiple sites: Secondary | ICD-10-CM | POA: Diagnosis not present

## 2020-07-23 ENCOUNTER — Telehealth: Payer: Self-pay | Admitting: Internal Medicine

## 2020-07-23 MED ORDER — PREDNISONE 10 MG PO TABS
ORAL_TABLET | ORAL | 0 refills | Status: DC
Start: 2020-07-23 — End: 2020-08-28

## 2020-07-23 NOTE — Telephone Encounter (Signed)
Spoke with patient regarding prior message.  Meets koch hypothesis - rash likely due to spiriva  Plan  - no more spiriva  - list spiriva as allergy -rash - if he wants for rash he can do Please take prednisone 40 mg x1 day, then 30 mg x1 day, then 20 mg x1 day, then 10 mg x1 day, and then 5 mg x1 day and stop or go to baseline prednisoine if he is on prednisone  - for now no anti-cholinergic - in future can try incruse  Patient stated he is on prednisone 43m daily .Patient wanted to make sure it will be ok to take the taper prednisone?  Dr.Ramaswamy can you please advise.  Thank you

## 2020-07-23 NOTE — Telephone Encounter (Signed)
Yeah do the burst of prednisone as advised Please take prednisone 40 mg x1 day, then 30 mg x1 day, then 20 mg x1 day, then 10 mg x1 day, and then stay at 58m per day baseline dose

## 2020-07-23 NOTE — Telephone Encounter (Signed)
Meets koch hypothesis - rash likely due to spiriva  Plan  - no more spiriva  - list spiriva as allergy -rash - if he wants for rash he can do Please take prednisone 40 mg x1 day, then 30 mg x1 day, then 20 mg x1 day, then 10 mg x1 day, and then 5 mg x1 day and stop or go to baseline prednisoine if he is on prednisone  - for now no anti-cholinergic - in future can try incruse

## 2020-07-23 NOTE — Telephone Encounter (Signed)
Spoke with the pt and made aware of response per MR  He verbalized understanding  Rx for pred was sent to pharm

## 2020-07-23 NOTE — Telephone Encounter (Signed)
Called and spoke with pt who stated he had stopped taking the spiriva due to breaking out in a rash. He had been off of it x2 weeks and the rash cleared up. Pt stated he did begin to take it again and stated that the rash has come back. Pt states that he has not started any new meds other than beginning taking the Spiriva again.  Pt said the rash is on his arms and he feels like it is also on his face. Pt denies any worsening SOB. Pt states that the rash does itch.   Pt said that he has not taken the spiriva today. Pt wants recommendations for what needs to happen due to breaking out in a rash each time be begins the spiriva. MR, please advise.

## 2020-07-29 DIAGNOSIS — M0589 Other rheumatoid arthritis with rheumatoid factor of multiple sites: Secondary | ICD-10-CM | POA: Diagnosis not present

## 2020-08-02 DIAGNOSIS — R0902 Hypoxemia: Secondary | ICD-10-CM | POA: Diagnosis not present

## 2020-08-08 DIAGNOSIS — E119 Type 2 diabetes mellitus without complications: Secondary | ICD-10-CM | POA: Diagnosis not present

## 2020-08-08 DIAGNOSIS — H2513 Age-related nuclear cataract, bilateral: Secondary | ICD-10-CM | POA: Diagnosis not present

## 2020-08-08 DIAGNOSIS — H5213 Myopia, bilateral: Secondary | ICD-10-CM | POA: Diagnosis not present

## 2020-08-15 DIAGNOSIS — Z23 Encounter for immunization: Secondary | ICD-10-CM | POA: Diagnosis not present

## 2020-08-28 ENCOUNTER — Encounter: Payer: Self-pay | Admitting: Internal Medicine

## 2020-08-28 ENCOUNTER — Other Ambulatory Visit: Payer: Self-pay

## 2020-08-28 ENCOUNTER — Ambulatory Visit: Payer: Medicare Other | Admitting: Internal Medicine

## 2020-08-28 VITALS — BP 120/70 | HR 100 | Temp 98.8°F | Ht 66.0 in | Wt 174.2 lb

## 2020-08-28 DIAGNOSIS — Z7185 Encounter for immunization safety counseling: Secondary | ICD-10-CM

## 2020-08-28 DIAGNOSIS — M359 Systemic involvement of connective tissue, unspecified: Secondary | ICD-10-CM | POA: Diagnosis not present

## 2020-08-28 DIAGNOSIS — J8489 Other specified interstitial pulmonary diseases: Secondary | ICD-10-CM | POA: Diagnosis not present

## 2020-08-28 DIAGNOSIS — D849 Immunodeficiency, unspecified: Secondary | ICD-10-CM | POA: Diagnosis not present

## 2020-08-28 DIAGNOSIS — Z5181 Encounter for therapeutic drug level monitoring: Secondary | ICD-10-CM

## 2020-08-28 LAB — BASIC METABOLIC PANEL
BUN: 9 mg/dL (ref 6–23)
CO2: 31 mEq/L (ref 19–32)
Calcium: 8.7 mg/dL (ref 8.4–10.5)
Chloride: 102 mEq/L (ref 96–112)
Creatinine, Ser: 0.78 mg/dL (ref 0.40–1.50)
GFR: 87.33 mL/min (ref >60.00)
Glucose, Bld: 146 mg/dL — ABNORMAL HIGH (ref 70–99)
Potassium: 4.2 mEq/L (ref 3.5–5.1)
Sodium: 142 mEq/L (ref 135–145)

## 2020-08-28 LAB — HEPATIC FUNCTION PANEL
ALT: 31 U/L (ref 0–53)
AST: 30 U/L (ref 0–37)
Albumin: 3.8 g/dL (ref 3.5–5.2)
Alkaline Phosphatase: 42 U/L (ref 39–117)
Bilirubin, Direct: 0.1 mg/dL (ref 0.0–0.3)
Total Bilirubin: 0.4 mg/dL (ref 0.2–1.2)
Total Protein: 6.8 g/dL (ref 6.0–8.3)

## 2020-08-28 LAB — CBC
HCT: 36.1 % — ABNORMAL LOW (ref 39.0–52.0)
Hemoglobin: 11.7 g/dL — ABNORMAL LOW (ref 13.0–17.0)
MCHC: 32.4 g/dL (ref 30.0–36.0)
MCV: 98.1 fl (ref 78.0–100.0)
Platelets: 214 10*3/uL (ref 150.0–400.0)
RBC: 3.68 Mil/uL — ABNORMAL LOW (ref 4.22–5.81)
RDW: 19 % — ABNORMAL HIGH (ref 11.5–15.5)
WBC: 6.8 10*3/uL (ref 4.0–10.5)

## 2020-08-28 NOTE — Addendum Note (Signed)
Addended by: Vanessa Barbara on: 08/28/2020 10:57 AM   Modules accepted: Orders

## 2020-08-28 NOTE — Patient Instructions (Addendum)
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) Chronic respiratory failure with hypoxia (HCC) Therapeutic drug monitoring   - weight loss, nausea/vomit with ofev and stopped  - rash in RUE with esbriet and stopped Sept 202   [facility] counseling  STable since last visit but esbriet caused rash -Your right heart catheterization does show mild pulmonary hypertension -suggesting you will qualify for nebulizer treprostinil  Plan  -CMA To  list esbriet as allergy -CMA will mark nintedanib as an allergy -Hold off on starting nebulized treprostinil  For now -Continue oxygen at rest and with exertion and at night - check blood Covid IgG 08/28/2020  - check blood cbc and bmet and lft 08/28/2020 - recommend covid  Booster   Follow-up -  -3 months do spirometry and DLCO -Return to clinic in 3 months for 30-minute visit   - discuss teprostininil at next vist  - check covid igg as response to booster

## 2020-08-28 NOTE — Addendum Note (Signed)
Addended by: Vanessa Barbara on: 08/28/2020 01:43 PM   Modules accepted: Orders

## 2020-08-28 NOTE — Addendum Note (Signed)
Addended by: Suzzanne Cloud E on: 08/28/2020 11:01 AM   Modules accepted: Orders

## 2020-08-28 NOTE — Progress Notes (Signed)
OV 01/01/2017  Chief Complaint  Patient presents with  . Follow-up    Pt here after PFT. Pt states overall his breathing is unchanged since last OV. Pt c/o occ dry cough. Pt deneis CP/tightness and f/c/s.    Follow-up interstitial lung disease UIP pattern associated with rheumatoid arthritis and immune suppression. Last visit in March 2017. He doesn't that overall he has been stable. He has a new rheumatologist but still at [facility] [address] [name]. His wife also says he is stable. However as he started talking to them his wife slowly started telling me that for the last few to several months he is wheezing more than usual and is a little more short of breath than usual. However he denied it. This is compliant with his medications. [name] not on any inhaler therapy. He uses oxygen only at night. Previous CT scans of the chest in September 2016 in March 2017 did not report any associated emphysema. His most recent pulmonary function test is shown that on Combivent. I personally visualized the graph: [name] Kitchen Forced vital capacity is stable with a diffusion capacity seems to decline over time. Walking desaturation test 185 feet 3 laps on room air: He desaturated to 84%. And then we walked him on 2 L oxygen he did not desaturate.   OV 06/23/2017  Chief Complaint  Patient presents with  . Follow-up    pt here after CT in 04/2017. Pt states his breathing is unchanged since last OV. Pt c/o interemittent dry cough. Pt denies CP/tightness and f/c/s.      Follow-up interstitial lung disease UIP pattern associated with rheumatoid arthritis and on Imuran. Last visit in March 2018 I was concerned about progressive interstitial lung disease because he started desaturating on room air and his FVC show decline. Currently rated a high-resolution CT chest that confirmed progressive lung disease. We walked him 185 feet 3 laps on room air and he desaturated at third lap to 89%. This is very  similar to previous visit suggesting that  that his decline has been more gradual over the last 1 year. Compared to last visit he feels stable. His wife feels the same. [name] aware of his worsening fibrosis and the potential need to change anti fibrotics  OV 09/27/2017  Chief Complaint  Patient presents with  . Follow-up    PFT done today. Pt states that he thinks his breathing has become worse. Has complaints of SOB with exertion, occ. dry cough. Denies any CP. States that he does wear O2 at night, DME: [facility].     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  Patient is on Imuran.  [name] having progressive disease this year 2018.  He told my certified medical assistant that he is having progressive shortness of breath but to me he tells me that he is stable.  As always he is a very poor historian.  Wife gives a better history.  He has oxygen at home for the night which she uses but does not use his exertional small portable system even though this helps him.  They do admit that dyspnea is progressive.  In fact pulmonary function test today shows progressive disease.  He also seem to desaturate a little bit more easily.  He continues to be on Imuran.  In talking to him it appears his rheumatologist has now started him on Rituxan.  He has had 2 doses 2 weeks apart with the  most recent one being mid November 2018.  The next dose is at mid May 2019 which is 41-monthinterval.  He says he is up-to-date with his flu shot but is not had a conversation with his primary care physician about the new shingles vaccine that is inactivated vaccine.  [name] not having much of joint issues.  [name] aware that the Rituxan is specifically designed to slow down the progression of pulmonary fibrosis.  Walking desaturation test 185 feet x3 laps on room air with a forehead probe.  Resting heart rate was 95/min.  Final heart rate 108/min.  Resting pulse ox was 95%.  By the time he  finished his second lap he desaturated to 84%.  He was then placed on 2 L nasal cannula and he was able to maintain his saturation to 95%.   OV 12/20/2017  Chief Complaint  Patient presents with  . Follow-up    follow up PFT,SOB with activity/exertion   [name] is here for follow-up because of his progressive UIP pattern pulmonary fibrosis related to rheumatoid arthritis.  At his last visit he was started on Rituxan by rheumatology.  Since then we started him on portable oxygen but he says he is not using it because he does not like to use it.  He does use oxygen at night which is helping.  His next dose of Rituxan is in May 2019.  Both he and his wife attest that since the start of Rituxan his symptoms are stable particularly shortness of breath is not any worse.  In fact pulmonary function test today shows continued ongoing stability with FVC and DLCO would suggest that the Rituxan might be helping him.  There are no new issues.  [name] here to have a shingles vaccine.  I told him about this last visit to talk to primary care physician but he does not remember this.   IMPRESSION: June 2018 1. Continued interval progression of fibrotic interstitial lung disease with extensive honeycombing, asymmetrically involving the right lung, with mild basilar predominance, diagnostic of usual interstitial pneumonia (UIP) . 2. Stable mild reactive mediastinal lymphadenopathy. 3. Aortic atherosclerosis.  One vessel coronary atherosclerosis.   Electronically Signed   By: [name]   On: 04/09/2017 10:51   OV 08/01/2018  Subjective:  Patient ID: [name] male , DOB: [birth year], age 74y.o. , MRN: [id number], ADDRESS: [address] [place] [id number]  08/01/2018 -   Chief Complaint  Patient presents with  . Follow-up    PFT today, increased coughing (non-productive)    Follow-up interstitial lung disease UIP pattern associated with rheumatoid arthritis. Rx Rituxan since approx  Oct/Nov 2018. Background Immuran  HPI LGroverScales 74y.o. -presents as usual with his wife.  He tells me that he continues on background Imuran.  [name] also on scheduled Rituxan.  He has an infusion coming up in a month or so.  This is according to his wife.  He continues to be a poor historian.  As best as I can gather from both of them is that his shortness of breath is actually a little bit better ever since starting Rituxan..  In fact his PFTs show continued improvement/stability.  With his FVC and DLCO.  However he tells me that his cough might be a little bit worse.  In talking to him it appears that is only mildly worse.  It happens mostly at night when he lies down.  He coughs  a few times and then it does not bother him.  The cough is dry.  He denies any postnasal drip or acid reflux disease.  He does not have any fever or sputum production.       OV 10/06/2018  Subjective:  Patient ID: Ameer Sanden, male , DOB: Nov 11, 1945 , age 65 y.o. , MRN: [id number] , ADDRESS: [address] [place] [address]   10/06/2018 -   Chief Complaint  Patient presents with  . Follow-up    f/u pulmonary fibrosis, no symptoms, complaints, breathing doind well     HPI Roth Reinoso 74 y.o. -returns for ILD follow-up in the setting of rheumatoid arthritis.  This time he is not here with his wife who has other schedule conflicts.  He tells me overall he stable.  He has now started Rituxan.  As again he is a poor historian.  I have him starting Rituxan a year ago but he told me that it was only started recently and has had only 1 infusion.  He tells me that he is stable.  His cough is well controlled with this anticholinergic he is using nighttime oxygen.  He says he is compliant with his medications.  Last visit I told him to inquire about using Bactrim prophylaxis in the setting of B-cell depletion in the setting of Rituxan.  He has no idea recollection of this conversation.  So I did tell him that we will check  his G6PD and discuss directly with his rheumatologist about starting Bactrim prophylaxis.  [name] okay with this plan.  Overall dyspnea and cough are stable and it is mild.  Walking desaturation test shows stability.     OV 06/29/2019  Subjective:  Patient ID: Andrewjames Weirauch, male , DOB: July 08, 1946 , age 15 y.o. , MRN: [id number] , ADDRESS: [address] [place]  [id number]   Follow-up interstitial lung disease UIP pattern associated with rheumatoid arthritis. Rx Rituxan since approx Oct/Nov 2018. On Background Immuran, prednisone and dapsone for pcp prophylaxois = high risk medication use   06/29/2019 - ILD- RA  followup    HPI Alexzander Bojarski 74 y.o. -presents for follow-up.  I personally saw him December 2019 before the onset of the pandemic.  He tells me that he is doing fair.  He feels he is overall stable.  He continues to be on Rituxan.  He tells me the last dose of this infusion was in May 05, 2019.  This is managed by rheumatology.  In addition he is on Imuran, prednisone.  Also by middle of year my nurse practitioner started him on dapsone for PCP prophylaxis.  He did have a high-resolution CT chest that shows UIP findings and stability since 2018.  However, he did desaturate with exertion when he saw the nurse practitioner just over a month ago.  This is a new finding as documented below.  We walked him again today and again found his desaturation with exertion.  It required 5 L to correct.  In past he has done well with RA at times but at times required 2L. He denies any fever or sputum production.  His previous echocardiogram was in 2013.  Most recent CT scan of the chest notes coronary artery calcification   Noted WBC going down after dapsine start Results for MEER, REINDL (MRN [id number]) as of 06/29/2019 10:19  Ref. Range 05/30/2019 10:17 06/07/2019 09:07 06/13/2019 08:59 06/20/2019 09:50  WBC Latest Ref Range: 4.0 - 10.5 K/uL 8.8 6.2 4.3 3.4 (L)  IMPRESSION: HRCT July 2020 1. No  significant interval change in severe pulmonary fibrosis, which is heterogeneously distributed and most severe in the right lung base, featuring extensive honeycombing and mild traction bronchiectasis. Although findings are not significantly changed compared to most recent examination dated 2018, they are clearly progressed over time in comparison to priors dating back to 2013. Findings remain in a UIP pattern by ATS pulmonary fibrosis criteria  2.  Coronary artery disease.   Electronically Signed   By: Eddie Candle [name]   On: 05/12/2019 13:14  ROS - per HPI   OV 01/04/2020  Subjective:  Patient ID: [name], male , DOB: 01/03/1946 , age 26 y.o. , MRN: [id number] , [address] [place] [address]   01/04/2020 -   Chief Complaint  Patient presents with  . Follow-up    Pt states he has been doing good since last visit. States he still has an occ cough.    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 Rx Rituxan since approx Oct/Nov 2018. On Background Immuran, prednisone and  = high risk medication use     HPI Leverne Murry 74 y.o. -returns for follow-up.  Last seen in the summer 2020.  Overall he is doing stable.  He continues with his immunosuppressive medications through rheumatology.  This include Imuran prednisone and Rituxan.  He had his infusion of Rituxan this week.  He feels stable but his pulmonary function test done for this visit shows a decline.  It is documented below his walking desaturation test also is a decline from the past but similar to last visit 6 months or so ago.  His main symptom is chronic cough.  [name] labeled it as a level 3 out of 5 but it appears it might actually be more severe.  [name] asking for palliative relief of cough.  His last liver function test with [place] was in the summer 2020.  It appears per review of the chart in the interim he has seen cardiology Dr. Percival Spanish for his coronary artery  calcification and is on expectant follow-up for that.  No stress test has been recommended because of the lack of chest pain.  His wife who is usually here with him is not here today.  He said she slept in.  [name] on MDI - no emphysema noted in prior CT    ROS - per HPI  OV 02/15/2020  Subjective:  Patient ID: Rohil Lesch, male , DOB: 24-Mar-1946 , age 71 y.o. , MRN: [id number] , ADDRESS: [place] [id number]   02/15/2020 -   Chief Complaint  Patient presents with  . Follow-up    Follow-up interstitial lung disease UIP pattern associated with rheumatoid arthritis.   - Rx Rituxan since approx Oct/Nov 2018. On Background Immuran, prednisone and  = high risk medication use  - started ofev mid -  end of march 2021 for progressive disease    HPI Maximo Mullenax 74 y.o. -he has progressive ILD because of rheumatoid arthritis UIP pattern.  Last visit started him on nintedanib.  [name] now here to follow-up for tolerance and monitoring of nintedanib because this requires intensive therapeutic monitoring.  [name] also immunosuppressed.  He tells me that he is now started nintedanib approximately 1 month ago.  [name] tolerating it fine without any nausea vomiting diarrhea.  Last visit he took the consent form for the ILD-pro registry study.  [name]  agreed now to participate in it.  He meets inclusion exclusion criteria because of progressive disease.  At this point in time he is got minimal symptoms.  He desaturated as before.  However he is not compliant with his portable oxygen with exertion.  His cough is improved because of Hycodan.   OV 05/01/2020  Subjective:  Patient ID: Menelik Mcfarren, male , DOB: 1946/07/18 , age 69 y.o. , MRN: [id number] , ADDRESS: [place] [address]   05/01/2020 -   Chief Complaint  Patient presents with  . Follow-up    pt is here to to disuss ild    OV 05/30/2020   Subjective:  Patient ID: [name], male , DOB:  May 07, 1946, age 57 y.o. years. , MRN: [id number],  ADDRESS: [address] [place] [address] PCP  Merrilee Seashore, MD Providers : Treatment Team:  Attending Provider: Brand Males, MD   Chief Complaint  Patient presents with  . Follow-up    Pt states he has been doing good since last visit and states his breathing is about the same.     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   - Rx Rituxan since approx Oct/Nov 2018. On Background Immuran, prednisone and  = high risk medication use  - started ofev mid -  end of march 2021 for progressive disease -> changed to [facility]  June 2021    HPI Delron Grassi 74 y.o. -presents for follow-up.  This time he has brought his wife.  Have not seen the wife in a long time.  She appears to have lost weight.  She says she is dealing with her own health issues of unexplained weight loss.  She says she is abreast of his health issues when he returns home after visits.  However patient continues to be stoic and always states he is feeling well.  Last time he said he was feeling well but had high level of symptoms.  This time also he says he is feeling well and gaining weight and has marked very low level of symptoms.  Therefore overall his history is unreliable.  In the interim he has had his right heart catheterization.  He had nonobstructive coronary artery disease his.  His mean pulmonary artery pressure was elevated at 25.  Wedge pressure could not be elucidated.  His peripheral vascular resistance appears greater than 3.  Therefore he qualifies for nebulized treprostinil.  However he still in the uptake with his pirfenidone.  [name] not applying sunscreen and he says he will.  [name] tolerating pirfenidone well.  He has gained weight no nausea no vomiting no diarrhea.  He uses 4 L of pulsed oxygen at rest.  However at room air at rest he was normal.  He desaturated only after walking 2 laps.    OV  08/28/2020   Subjective:  Patient ID: Duy Lemming, male , DOB: 1946/04/14, age 74 y.o. years. , MRN: [id number],  ADDRESS: [address] [place] [address] PCP  Merrilee Seashore, MD Providers : Treatment Team:  Attending Provider: Brand Males, MD Patient Care Team: Merrilee Seashore, MD as PCP - General (Internal Medicine) Hennie Duos, MD as Consulting Physician (Rheumatology) Lauraine Rinne, NP as Nurse Practitioner (Pulmonary Disease)    Chief Complaint  Patient presents with  . Follow-up    coughs when starts eating.      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   -  Rx Rituxan since approx Oct/Nov 2018. On Background Immuran, prednisone and  = high risk medication use  - started ofev mid -  end of march 2021 for progressive disease -> changed to [facility]  June 2021 -> stopped sept 2021 due to rash     HPI Tamel Couts 74 y.o. -returns for follow-up.  At this visit he was supposed to do spirometry and DLCO but he has not.  He presents by himself.  It is unclear why he did not do spirometry and DLCO.  As usual he is stoic and does not give much of a history.  His symptom score appears stable.  His weight appears stable.  He uses 4 L of oxygen.  Between the last 2 visits he made phone calls suggesting initially that the pirfenidone was causing a rash in his upper arm but later the phone call said Spiriva.  We indicated that he should stop the Spiriva.  Today he tells me that he is actually continuing his Spiriva.  He tells me that the rash was caused by the pirfenidone/Esbriet.  Initially did not seem sure it was because of the [place] but latter when I repeated questions in multiple different ways he indicated it was desperate causing the rash therefore he stopped it.  He has been 1 month since he stopped it and the rash is disappeared.  The rash was in a nonexposed area in the right upper extremity.  He has had his Covid  vaccine but has not had his booster.  He continues to be on Rituxan which is tolerating well.  I did indicate to him that in the absence of B cells he might not have antibodies and he should get his Covid booster.  We will check Covid IgG as follow-up post booster needs continued intensive monitoring due to high complex status and immunosuppression    SYMPTOM SCALE - ILD 06/29/2019  01/04/2020  02/15/2020  05/01/2020  05/30/2020  08/28/2020   O2 use Uses 3-5L with exertion yuses 3-5L with exertion Non compliant 168# and 4L with exertion - on esbriet 172# on esbriet - 4L at rest  - filled bu him and wife 174# 4L o2  Shortness of Breath 0 -> 5 scale with 5 being worst (score 6 If unable to do)       At rest 3 0 0 4 0 0  Simple tasks - showers, clothes change, eating, shaving 2 1 0 5 0 0  Household (dishes, doing bed, laundry) 2 1 0 5 0   Shopping _0 0 0  Walking level at own pace _1 0 0  Walking up Stairs _2 Total (40 - 48) Dyspnea Score _3 How bad is your cough? _4 How bad is your fatigue 2 1 0 4 0 0  nasuea    4 0 0  vomit    0 0 0  diarrhea  0 0 0 0 0  anxiety  0 0 0 0 0  deipression  0 0 0 0 0       Simple office walk 08/01/2018 -  185 feet x  3 laps goal with forehead probe at [employer]  10/06/2018 250 feet x 3 laps 06/29/2019  01/04/2020  02/15/2020  05/30/2020   O2 used _5  raoom air after turing o2  off fx 5 mn  Number laps completed 3 3 Aim for 3 1 Did only 1 of 3 Did all 3 Did only 2 laps  Comments about pace normal normal  avg pace avg opae avg pagce  Resting Pulse Ox/HR 100% and 80/min 100% and 81/min 94% and 96/min 96% and 101/min 99% and 98/min 96% and 94/min  Final Pulse Ox/HR 94% and 114/min 91% and 111/min 86% at first lap, HR 131 86% and 118/min 88% and 123/min 83% and 125/,n  Desaturated </= 88% no no  yes yes   Desaturated <= 3% points yes yes  yes yes   Got Tachycardic >/= 90/min yes  yes  yes yes   Symptoms at end of test x x  Mild dyspnea dyspnea   Miscellaneous comments x x Needed 5L [place] to correct       Results for ASHOK, SAWAYA (MRN [id number]) as of 01/04/2020 09:36  Ref. Range 01/23/2014 15:38 10/01/2014 11:24 07/29/2015 11:09 12/18/2016 09:34 09/27/2017 09:33 12/20/2017 11:29 08/01/2018 09:39 12/26/2019 08:54 04/08/20  FVC-Pre Latest Units: L 2.18 2.05 2.17 2.21 2.06 2.10 2.09 1.96 1.73  FVC-%Pred-Pre Latest Units: % 66 62 66 68 64 65 69 61 54%   Results for DEMONT, LINFORD (MRN [id number]) as of 01/04/2020 09:36  Ref. Range 01/23/2014 15:38 10/01/2014 11:24 07/29/2015 11:09 12/18/2016 09:34 09/27/2017 09:33 12/20/2017 11:29 08/01/2018 09:39 12/26/2019 08:54 6/7  DLCO unc Latest Units: ml/min/mmHg 17.93 16.86 16.72 13.81 12.14 12.30 13.24 13.14 9/15  DLCO unc % pred Latest Units: % 66 62 62 51 45 45 52 58 41%    PFT Results Latest Ref Rng & Units 04/08/2020 12/26/2019 08/01/2018 12/20/2017 09/27/2017 12/18/2016 07/29/2015  FVC-Pre L 1.73 1.96 2.09 2.10 2.06 2.21 2.17  FVC-Predicted Pre % 54 61 69 65 64 68 66  FVC-Post L - - - - - 2.28 2.20  FVC-Predicted Post % - - - - - 70 67  Pre FEV1/FVC % % 90 86 89 88 88 86 86  Post FEV1/FCV % % - - - - - 88 89  FEV1-Pre L 1.55 1.69 1.87 1.84 1.81 1.92 1.88  FEV1-Predicted Pre % 66 72 84 77 75 80 77  FEV1-Post L - - - - - 2.01 1.95  DLCO uncorrected ml/min/mmHg 8.10 13.14 13.24 12.30 12.14 13.81 16.72  DLCO UNC% % 36 58 52 45 45 51 62  DLCO corrected ml/min/mmHg 9.15 - - - 13.07 14.51 -  DLCO COR %Predicted % 41 - - - 48 53 -  DLVA Predicted % 68 88 95 80 78 90 119  TLC L - - - - - - 3.91  TLC % Predicted % - - - - - - 62  RV % Predicted % - - - - - - 63        has a past medical history of Arthritis, COPD (chronic obstructive pulmonary disease) (HCC), Diabetes mellitus, Hypertension, Pneumonia, and Thyroid disease.   reports that he quit smoking about 35 years ago. His smoking use included cigarettes. He has a 5.00 pack-year smoking  history. He has never used smokeless tobacco.  Past Surgical History:  Procedure Laterality Date  . KNEE ARTHROSCOPY    . NO PAST SURGERIES    . RIGHT/LEFT HEART CATH AND CORONARY ANGIOGRAPHY N/A 05/21/2020   Procedure: RIGHT/LEFT HEART CATH AND CORONARY ANGIOGRAPHY;  Surgeon: Martinique, Peter M, MD;  Location: [facility];  Service: Cardiovascular;  Laterality: N/A;    Allergies  Allergen Reactions  . Bactrim [Sulfamethoxazole-Trimethoprim]  Shortness Of Breath    Patient states "got short winded"  . Spiriva Respimat [Tiotropium Bromide Monohydrate] Rash  . Ofev [Nintedanib]     Immunization History  Administered Date(s) Administered  . Fluad Quad(high Dose 65+) 08/12/2020  . Influenza Split 08/03/2011, 07/03/2012, 09/02/2013, 07/03/2014  . Influenza, High Dose Seasonal PF 08/02/2016, 08/02/2017, 07/25/2018, 07/04/2019  . Influenza,inj,Quad PF,6+ Mos 07/29/2015  . PFIZER SARS-COV-2 Vaccination 01/13/2020, 02/05/2020  . Pneumococcal Conjugate-13 08/01/2018  . Pneumococcal Polysaccharide-23 11/03/2007  . Zoster Recombinat (Shingrix) 04/11/2019    Family History  Problem Relation Age of Onset  . Diabetes Mother   . Stroke Mother      Current Outpatient Medications:  .  acetaminophen (TYLENOL) 500 MG tablet, Take 1,000 mg by mouth every 6 (six) hours as needed for moderate pain or headache., Disp: , Rfl:  .  amLODipine (NORVASC) 10 MG tablet, Take 10 mg by mouth daily., Disp: , Rfl:  .  aspirin 81 MG tablet, Take 81 mg by mouth daily., Disp: , Rfl:  .  atorvastatin (LIPITOR) 20 MG tablet, Take 20 mg by mouth daily., Disp: , Rfl: 2 .  azaTHIOprine (IMURAN) 50 MG tablet, Take 100 mg by mouth daily. , Disp: , Rfl:  .  insulin NPH Human (NOVOLIN N) 100 UNIT/ML injection, Inject 12 Units into the skin daily. , Disp: , Rfl:  .  Insulin Syringe-Needle U-100 (INSULIN SYRINGE .3CC/31GX5/16") 31G X 5/16" 0.3 ML MISC, Inject 1 Syringe into the skin as directed., Disp: , Rfl:  .   levothyroxine (SYNTHROID, LEVOTHROID) 25 MCG tablet, Take 25 mcg by mouth daily., Disp: , Rfl:  .  metFORMIN (GLUCOPHAGE-XR) 500 MG 24 hr tablet, Take 2 tablets (1,000 mg total) by mouth 2 (two) times daily., Disp: , Rfl:  .  ondansetron (ZOFRAN) 4 MG tablet, Take 1 tablet (4 mg total) by mouth 3 (three) times daily as needed for nausea or vomiting., Disp: 30 tablet, Rfl: 5 .  pantoprazole (PROTONIX) 40 MG tablet, Take 40 mg by mouth daily., Disp: , Rfl:  .  predniSONE (DELTASONE) 1 MG tablet, Take 3 mg by mouth daily. , Disp: , Rfl:  .  Tiotropium Bromide Monohydrate (SPIRIVA RESPIMAT) 2.5 MCG/ACT AERS, Inhale 2 puffs into the lungs daily. (Patient taking differently: Inhale 2 puffs into the lungs daily as needed (shortness of breath). ), Disp: 4 g, Rfl: 0 .  Pirfenidone (ESBRIET) 267 MG TABS, Take 3 tablets (801 mg total) by mouth with breakfast, with lunch, and with evening meal. (Patient not taking: Reported on 08/28/2020), Disp: 270 tablet, Rfl: 1      Objective:   Vitals:   08/28/20 1008  BP: 120/70  Pulse: 100  Temp: 98.8 F (37.1 C)  TempSrc: Temporal  SpO2: 97%  Weight: 174 lb 3.2 oz (79 kg)  Height: _0  (1.676 m)    Estimated body mass index is 28.12 kg/m as calculated from the following:   Height as of this encounter: _1  (1.676 m).   Weight as of this encounter: 174 lb 3.2 oz (79 kg).  _2 @  Filed Weights   08/28/20 1008  Weight: 174 lb 3.2 oz (79 kg)     Physical Exam   General: No distress. Looks same Neuro: Alert and Oriented x 3. GCS 15. Speech normal Psych: Pleasant Resp:  Barrel Chest - no.  Wheeze - no, Crackles - yes, No overt respiratory distress CVS: Normal heart sounds. Murmurs - no Ext: Stigmata of Connective Tissue Disease - Of RA +  HEENT: Normal upper airway. PEERL +. No post nasal drip        Assessment:       ICD-10-CM   1. Interstitial lung disease due to connective tissue disease ([place])  J84.89    M35.9   2.  Therapeutic drug monitoring  Z51.81   3. Vaccine counseling  Z71.85   4. 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) Chronic respiratory failure with hypoxia (HCC) Therapeutic drug monitoring   - weight loss, nausea/vomit with ofev and stopped  - rash in RUE with esbriet and stopped Sept 202   VAccine counseling  STable since last visit but esbriet caused rash -Your right heart catheterization does show mild pulmonary hypertension -suggesting you will qualify for nebulizer treprostinil  Plan  -CMA To  list esbriet as allergy -CMA will mark nintedanib as an allergy -Hold off on starting nebulized treprostinil  For now -Continue oxygen at rest and with exertion and at night - check blood Covid IgG 08/28/2020  - check blood cbc and bmet and lft 08/28/2020 - recommend covid  Booster   Follow-up -  -3 months do spirometry and DLCO -Return to clinic in 3 months for 30-minute visit   - discuss teprostininil at next vist  - check covid igg as response to booster     SIGNATURE    Dr. Brand Males, [name], [name],  Pulmonary and Critical Care Medicine Staff Physician, [place] Director - Interstitial Lung Disease  Program  Pulmonary [place] [facility] [place], [place], [address]  Pager: [phone], If no answer or between  15:00h - 7:00h: call [phone] Telephone: [phone]  10:48 AM 08/28/2020

## 2020-08-29 LAB — SARS-COV-2 ANTIBODY(IGG)SPIKE,SEMI-QUANTITATIVE: SARS COV1 AB(IGG)SPIKE,SEMI QN: <1 index (ref <1.00)

## 2020-09-02 DIAGNOSIS — R0902 Hypoxemia: Secondary | ICD-10-CM | POA: Diagnosis not present

## 2020-09-25 IMAGING — CT CT CHEST HIGH RESOLUTION WITHOUT CONTRAST
2 of 6 series · 15 of 36 positions shown, 18 images · non-contrast
Comparison: 04/09/2017, 01/20/2016, 07/25/2015, 01/23/2014,
01/28/2012

CLINICAL DATA: ILD, rheumatoid arthritis

EXAM:
CT CHEST WITHOUT CONTRAST
TECHNIQUE: Multidetector CT imaging of the chest was performed following the
standard protocol without intravenous contrast. High resolution
imaging of the lungs, as well as inspiratory and expiratory imaging,
was performed.

[Series 3: thorax · axial · 0.70mm/px · z∈[-132,+170]mm · 12 of 167 slices shown, 15 images]
[im 8/167  mediastinal]
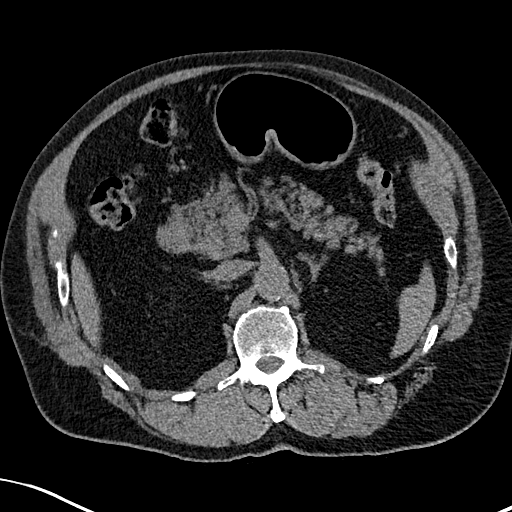
[im 8/167  lung]
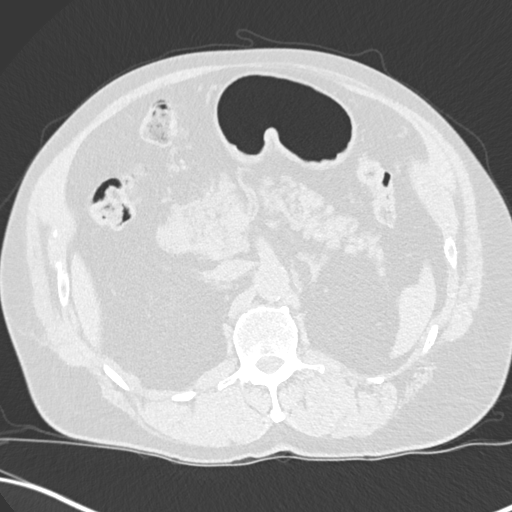
[im 24/167  lung]
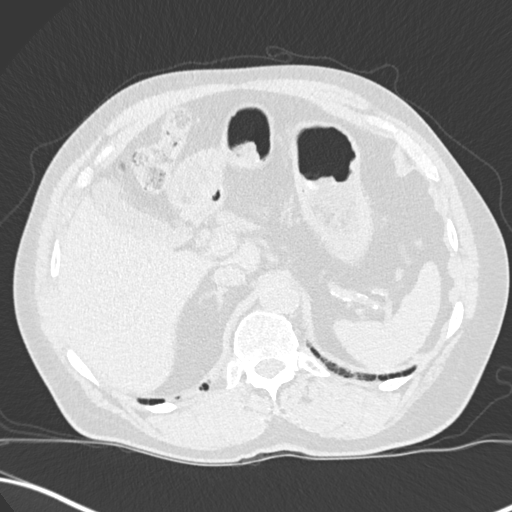
[im 40/167  lung]
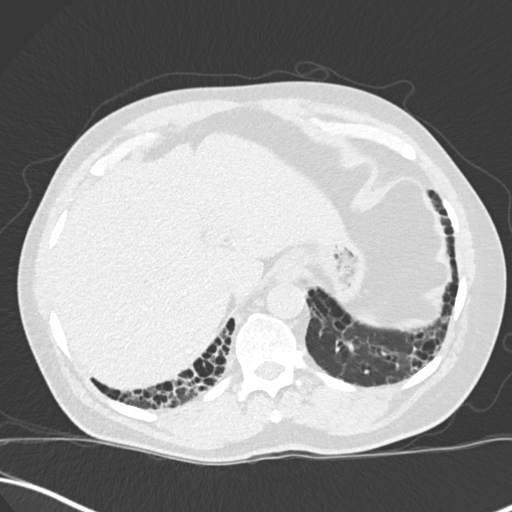
[im 48/167  lung]
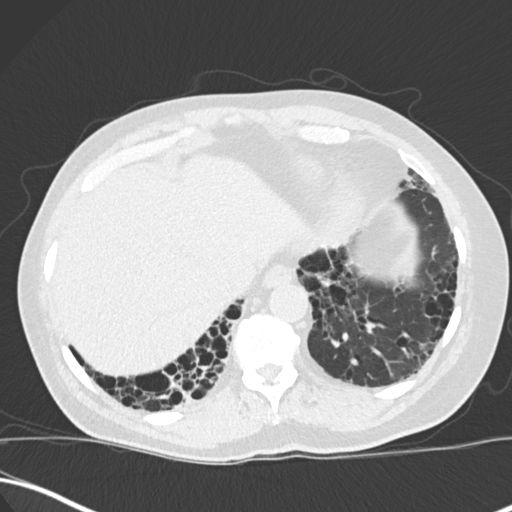
[im 64/167  mediastinal]
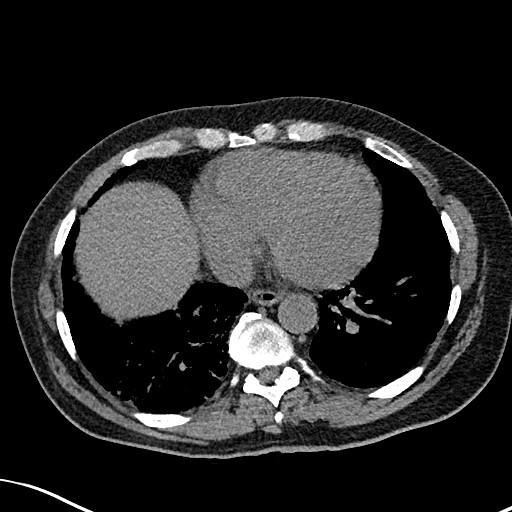
[im 64/167  lung]
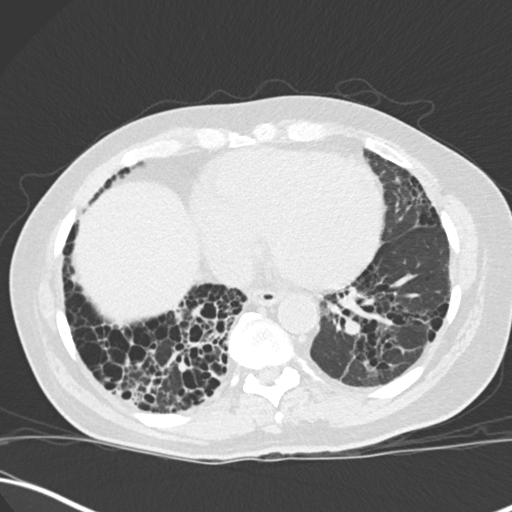
[im 80/167  lung]
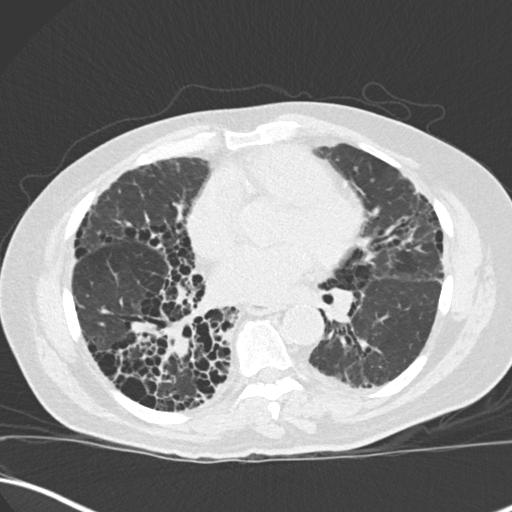
[im 87/167  lung]
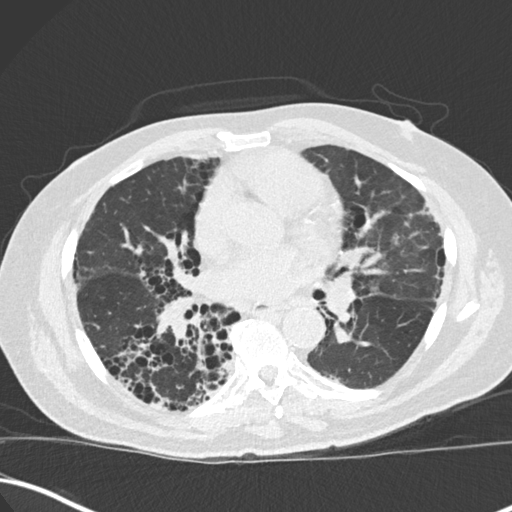
[im 103/167  lung]
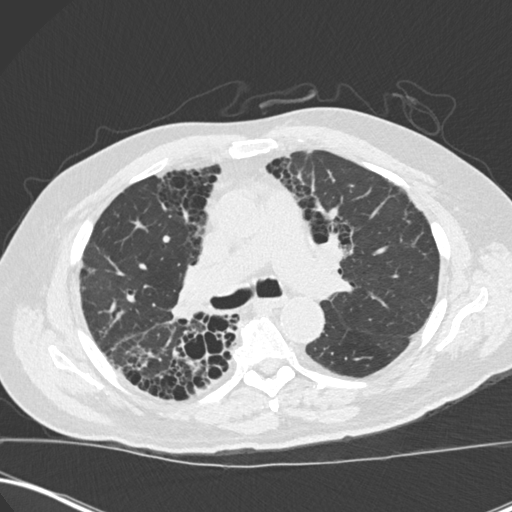
[im 119/167  mediastinal]
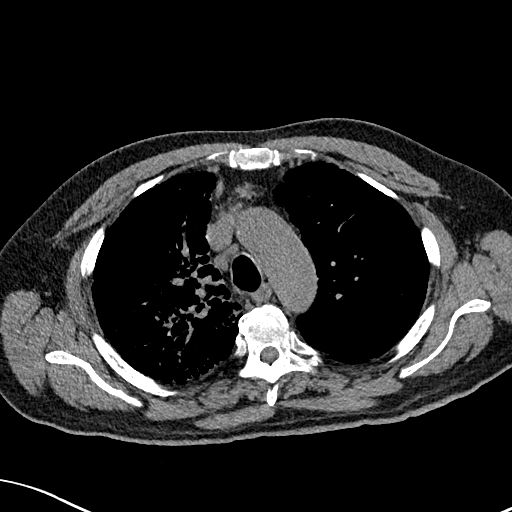
[im 119/167  lung]
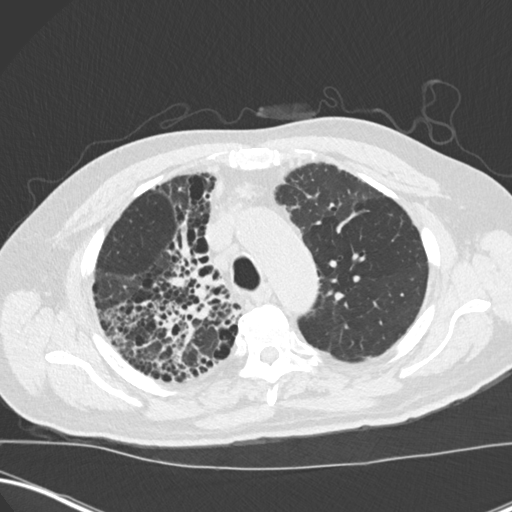
[im 127/167  lung]
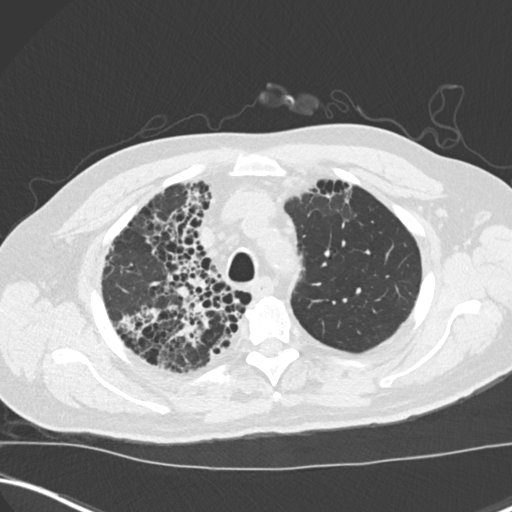
[im 143/167  lung]
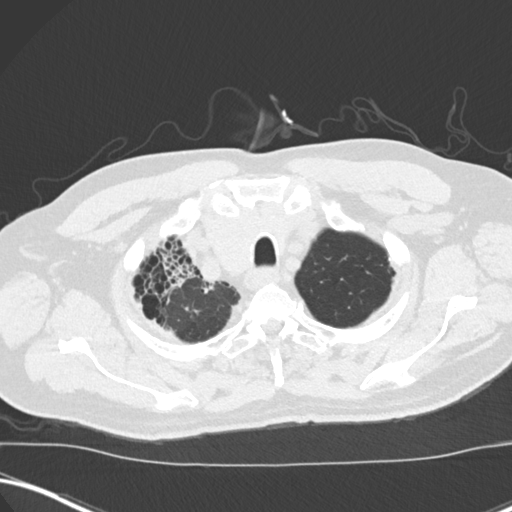
[im 159/167  lung]
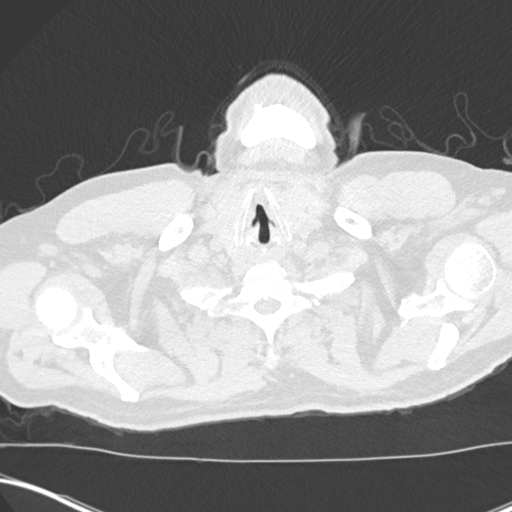

[Series 8: coronal · coronal · 0.66mm/px · 3 of 138 slices shown]
[im 28/138  lung]
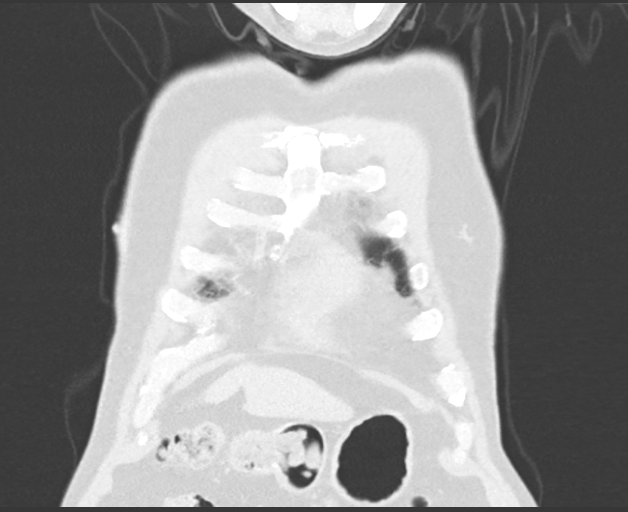
[im 55/138  lung]
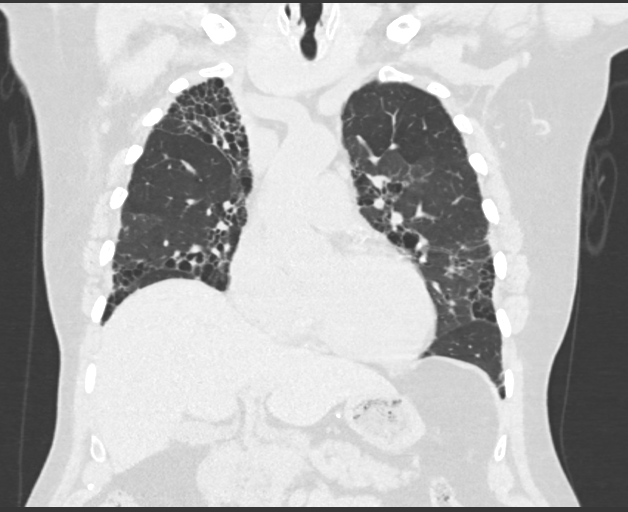
[im 83/138  lung]
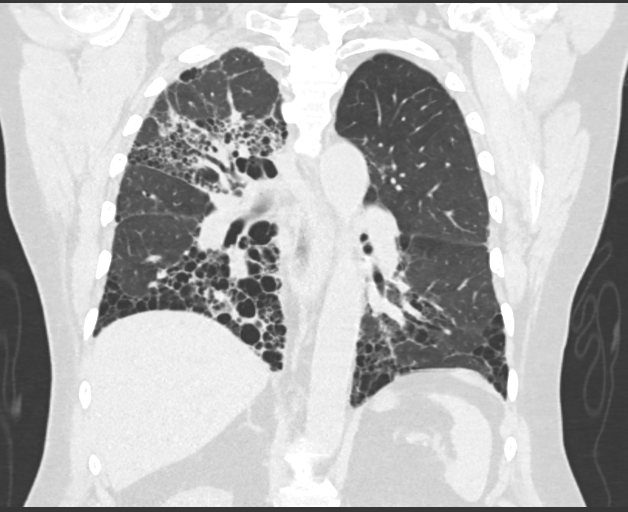

[15 of 36 positions shown; findings below may reference images not displayed]

FINDINGS: Cardiovascular: Scattered aortic atherosclerosis. Normal heart size.
Scattered three-vessel coronary artery calcification. No pericardial
effusion.

Mediastinum/Nodes: Unchanged enlarged mediastinal lymph nodes,
likely reactive in the setting of fibrosis. Thyroid gland, trachea,
and esophagus demonstrate no significant findings.

Lungs/Pleura: No significant interval change in severe pulmonary
fibrosis, which is heterogeneously distributed and most severe in
the right lung base, featuring extensive honeycombing and mild
traction bronchiectasis. Although findings are not significantly
changed compared to examination dated 0613, they are clearly
progressed over time in comparison to priors dating back to 1099. No
significant air trapping. No pleural effusion or pneumothorax.

Upper Abdomen: No acute abnormality.

Musculoskeletal: No chest wall mass or suspicious bone lesions
identified.
IMPRESSION: 1. No significant interval change in severe pulmonary fibrosis,
which is heterogeneously distributed and most severe in the right
lung base, featuring extensive honeycombing and mild traction
bronchiectasis. Although findings are not significantly changed
compared to most recent examination dated 0613, they are clearly
progressed over time in comparison to priors dating back to 1099.
Findings remain in a UIP pattern by ATS pulmonary fibrosis criteria

2.  Coronary artery disease.

## 2020-10-02 DIAGNOSIS — E1065 Type 1 diabetes mellitus with hyperglycemia: Secondary | ICD-10-CM | POA: Diagnosis not present

## 2020-10-02 DIAGNOSIS — I10 Essential (primary) hypertension: Secondary | ICD-10-CM | POA: Diagnosis not present

## 2020-10-02 DIAGNOSIS — E782 Mixed hyperlipidemia: Secondary | ICD-10-CM | POA: Diagnosis not present

## 2020-10-02 DIAGNOSIS — R0902 Hypoxemia: Secondary | ICD-10-CM | POA: Diagnosis not present

## 2020-10-09 DIAGNOSIS — E1065 Type 1 diabetes mellitus with hyperglycemia: Secondary | ICD-10-CM | POA: Diagnosis not present

## 2020-10-09 DIAGNOSIS — M0589 Other rheumatoid arthritis with rheumatoid factor of multiple sites: Secondary | ICD-10-CM | POA: Diagnosis not present

## 2020-10-09 DIAGNOSIS — I10 Essential (primary) hypertension: Secondary | ICD-10-CM | POA: Diagnosis not present

## 2020-10-09 DIAGNOSIS — I700 Atherosclerosis of aorta: Secondary | ICD-10-CM | POA: Diagnosis not present

## 2020-10-09 DIAGNOSIS — J849 Interstitial pulmonary disease, unspecified: Secondary | ICD-10-CM | POA: Diagnosis not present

## 2020-10-14 ENCOUNTER — Telehealth: Payer: Self-pay | Admitting: Internal Medicine

## 2020-10-14 NOTE — Telephone Encounter (Signed)
Called [facility] , clarified in Note from Dr. Chase Caller says to stop Spiriva .  Clarified with patient he is no longer taking   Will send to Dr. Chase Caller for [facility]

## 2020-10-30 ENCOUNTER — Telehealth: Payer: Self-pay | Admitting: Internal Medicine

## 2020-10-30 MED ORDER — SPIRIVA RESPIMAT 2.5 MCG/ACT IN AERS: 2.0000 | INHALATION_SPRAY | Freq: Every day | RESPIRATORY_TRACT | 3 refills | Status: DC

## 2020-10-30 NOTE — Telephone Encounter (Signed)
Called and spoke with pt who is requesting to have the spiriva inhaler refilled. Verified preferred pharmacy and sent Rx in for pt. Nothing further needed.

## 2020-11-02 DIAGNOSIS — R0902 Hypoxemia: Secondary | ICD-10-CM | POA: Diagnosis not present

## 2020-11-07 ENCOUNTER — Telehealth: Payer: Self-pay | Admitting: Internal Medicine

## 2020-11-07 ENCOUNTER — Encounter: Payer: Medicare Other | Admitting: *Deleted

## 2020-11-07 DIAGNOSIS — J8489 Other specified interstitial pulmonary diseases: Secondary | ICD-10-CM

## 2020-11-07 DIAGNOSIS — M359 Systemic involvement of connective tissue, unspecified: Secondary | ICD-10-CM

## 2020-11-07 NOTE — Research (Signed)
Title: Chronic Fibrosing Interstitial Lung Disease with Progressive Phenotype Prospective Outcomes (ILD-PRO) Registry   Protocol #: IPF-PRO-SUB, Clinical Trials # [id number], Sponsor: [employer]  Protocol Version Amendment 4 dated 12Sep2019  and confirmed current on 11/07/2020 Consent Version for today's visit date of 11/07/2020  is Version 4 Amendment ([id number])  Objectives:  [name] Kitchen Describe current approaches to diagnosis and treatment of chronic fibrosing ILDs with progressive phenotype  . Describe the natural history of chronic fibrosing ILDs with progressive phenotype  . Assess quality of life from self-administered participant reported questionnaires for each disease group  . Describe participant interactions with the healthcare system, describe treatment practices across multiple institutions for each disease group  . Collect biological samples linked to well characterized chronic fibrosing ILDs with progressive phenotype to identify disease biomarkers  . Collect data and biological samples that will support future research studies.                                            Key Inclusion Criteria: Willing and able to provide informed consent  Age ? 30 years  Diagnosis of a non-IPF ILD of any duration, including, but not limited to Idiopathic Non-Specific Interstitial Pneumonia (INSIP), Unclassifiable Idiopathic Interstitial Pneumonias (IIPs), Interstitial Pneumonia with Autoimmune Features (IPAF), Autoimmune ILDs such as Rheumatoid Arthritis (RA-ILD) and Systemic Sclerosis (SSC-ILD), Chronic Hypersensitivity Pneumonitis (HP), Sarcoidosis or Exposure-related ILDs such as asbestosis.  Chronic fibrosing ILD defined by reticular abnormality with traction bronchiectasis with or without honeycombing confirmed by chest HRCT scan and/or lung biopsy.  Progressive phenotype as defined by fulfilling at least one of the criteria below of fibrotic changes (progression set point)  within the last 24 months regardless of treatment considered appropriate in individual ILDs:  . decline in FVC % predicted (% pred) based on >10% relative decline  . decline in FVC % pred based on ? 5 - <10% relative decline in FVC combined with worsening of respiratory symptoms as assessed by the site investigator  . decline in FVC % pred based on ? 5 - <10% relative decline in FVC combined with increasing extent of fibrotic changes on chest imaging (HRCT scan) as assessed by the site investigator  . decline in DLCO % pred based on ? 10% relative decline  . worsening of respiratory symptoms as well as increasing extent of fibrotic changes on chest imaging (HRCT scan) as assessed by the site investigator independent of FVC change.    Key Exclusion Criteria: Malignancy, treated or untreated, other than skin or early stage prostate cancer, within the past 5 years  Currently listed for lung transplantation at the time of enrollment  Currently enrolled in a clinical trial at the time of enrollment in this registry     Clinical Research Coordinator / Research RN note : This visit for Subject [name] with DOB: December 05, 1945 on 11/07/2020 for the above protocol is Visit/Encounter #1 and is for purpose of research. The consent for this encounter is under Protocol Version Amendment 4 dated (12Sep2019) and is currently IRB approved. Subject expressed continued interest and consent in continuing as a study subject. Subject confirmed that there was no change in contact information (e.g. address, telephone, email). Subject thanked for participation in research and contribution to science.   During this visiton01/04/2021, the subject completed the blood work and questionnaires per the above referenced protocol. Please refer to the subject's paper  source binder for further details.  Signed by  Lazaro Arms Clinical Research Coordinator / Nurse [facility]  [place], [place] 10:48 11/07/2020

## 2020-11-07 NOTE — Telephone Encounter (Signed)
Called and spoke with pts wife and she stated that the pt has had a cough since he was last seen by MR on 08/28/20.  She stated that he was seen by Research today.  She is requesting that something be called in for the pt for his cough. This is a dry cough and he has used otc meds that have not helped.  He has been using cough drops and she said if he is not coughing he is sneezing.  MR please advise. Thanks

## 2020-11-08 MED ORDER — BENZONATATE 100 MG PO CAPS
200.0000 mg | ORAL_CAPSULE | Freq: Three times a day (TID) | ORAL | 1 refills | Status: DC | PRN
Start: 2020-11-08 — End: 2021-03-10

## 2020-11-08 MED ORDER — FLUTICASONE PROPIONATE 50 MCG/ACT NA SUSP
2.0000 | Freq: Every day | NASAL | 2 refills | Status: DC
Start: 2020-11-08 — End: 2023-02-03

## 2020-11-08 NOTE — Telephone Encounter (Signed)
Probably rlated to the fibrosis . [name] already on steroids. Can be difficult to Rx.  But given fact he is sneezing can try nasal sinus Rx first  Plan  -  take generic fluticasone inhaler 2 squirts each nostril daily - tessalon perles 269m tid prn - 30 day supply wuith 1 refill - if this does not work, at followup 2/1 I can consider CT sinus and gabapentin or opioids ets    Allergies  Allergen Reactions  . Bactrim [Sulfamethoxazole-Trimethoprim] Shortness Of Breath    Patient states "got short winded"  . Pirfenidone Rash  . Spiriva Respimat [Tiotropium Bromide Monohydrate] Rash  . Ofev [Nintedanib]     Scheduled Meds: Continuous Infusions: PRN Meds:.  Current Outpatient Medications:  .  acetaminophen (TYLENOL) 500 MG tablet, Take 1,000 mg by mouth every 6 (six) hours as needed for moderate pain or headache., Disp: , Rfl:  .  amLODipine (NORVASC) 10 MG tablet, Take 10 mg by mouth daily., Disp: , Rfl:  .  aspirin 81 MG tablet, Take 81 mg by mouth daily., Disp: , Rfl:  .  atorvastatin (LIPITOR) 20 MG tablet, Take 20 mg by mouth daily., Disp: , Rfl: 2 .  azaTHIOprine (IMURAN) 50 MG tablet, Take 100 mg by mouth daily. , Disp: , Rfl:  .  insulin NPH Human (NOVOLIN N) 100 UNIT/ML injection, Inject 12 Units into the skin daily. , Disp: , Rfl:  .  Insulin Syringe-Needle U-100 (INSULIN SYRINGE .3CC/31GX5/16") 31G X 5/16" 0.3 ML MISC, Inject 1 Syringe into the skin as directed., Disp: , Rfl:  .  levothyroxine (SYNTHROID, LEVOTHROID) 25 MCG tablet, Take 25 mcg by mouth daily., Disp: , Rfl:  .  metFORMIN (GLUCOPHAGE-XR) 500 MG 24 hr tablet, Take 2 tablets (1,000 mg total) by mouth 2 (two) times daily., Disp: , Rfl:  .  ondansetron (ZOFRAN) 4 MG tablet, Take 1 tablet (4 mg total) by mouth 3 (three) times daily as needed for nausea or vomiting., Disp: 30 tablet, Rfl: 5 .  pantoprazole (PROTONIX) 40 MG tablet, Take 40 mg by mouth daily., Disp: , Rfl:  .  predniSONE (DELTASONE) 1 MG tablet, Take 3  mg by mouth daily. , Disp: , Rfl:  .  Tiotropium Bromide Monohydrate (SPIRIVA RESPIMAT) 2.5 MCG/ACT AERS, Inhale 2 puffs into the lungs daily., Disp: 12 g, Rfl: 3

## 2020-11-08 NOTE — Telephone Encounter (Signed)
Spoke with pt's wife and notified her that fluticasone and tessalon pearls would be ordered for [place]. Pt's wife verified pharmacy to be [facility] in [place]. Pt's wife stated understanding and agreement of Dr. Golden Pop plan. Nothing further needed at this time.

## 2020-11-13 ENCOUNTER — Telehealth: Payer: Self-pay | Admitting: Internal Medicine

## 2020-11-13 NOTE — Telephone Encounter (Signed)
MR please advise on placing an order for the pt to receive a larger oxygen tank.  He stated that he will need one that will go up to 4.  The one that he has now will only go up to 3.

## 2020-11-14 ENCOUNTER — Encounter (HOSPITAL_COMMUNITY): Payer: Self-pay

## 2020-11-14 ENCOUNTER — Other Ambulatory Visit: Payer: Self-pay

## 2020-11-14 ENCOUNTER — Inpatient Hospital Stay (HOSPITAL_COMMUNITY)
Admission: EM | Admit: 2020-11-14 | Discharge: 2020-11-22 | DRG: 193 | Disposition: A | Discharge status: Home / Self Care | Payer: Medicare Other | Attending: Pulmonary Disease | Admitting: Pulmonary Disease

## 2020-11-14 ENCOUNTER — Emergency Department (HOSPITAL_COMMUNITY): Payer: Medicare Other

## 2020-11-14 DIAGNOSIS — R0603 Acute respiratory distress: Secondary | ICD-10-CM | POA: Diagnosis present

## 2020-11-14 DIAGNOSIS — M1990 Unspecified osteoarthritis, unspecified site: Secondary | ICD-10-CM | POA: Diagnosis present

## 2020-11-14 DIAGNOSIS — J189 Pneumonia, unspecified organism: Principal | ICD-10-CM | POA: Diagnosis present

## 2020-11-14 DIAGNOSIS — Z881 Allergy status to other antibiotic agents status: Secondary | ICD-10-CM | POA: Diagnosis not present

## 2020-11-14 DIAGNOSIS — Z79890 Hormone replacement therapy (postmenopausal): Secondary | ICD-10-CM

## 2020-11-14 DIAGNOSIS — J8410 Pulmonary fibrosis, unspecified: Secondary | ICD-10-CM | POA: Diagnosis not present

## 2020-11-14 DIAGNOSIS — R0902 Hypoxemia: Secondary | ICD-10-CM | POA: Diagnosis not present

## 2020-11-14 DIAGNOSIS — Z87891 Personal history of nicotine dependence: Secondary | ICD-10-CM | POA: Diagnosis not present

## 2020-11-14 DIAGNOSIS — E876 Hypokalemia: Secondary | ICD-10-CM | POA: Diagnosis not present

## 2020-11-14 DIAGNOSIS — E039 Hypothyroidism, unspecified: Secondary | ICD-10-CM | POA: Diagnosis present

## 2020-11-14 DIAGNOSIS — Z9981 Dependence on supplemental oxygen: Secondary | ICD-10-CM

## 2020-11-14 DIAGNOSIS — Z794 Long term (current) use of insulin: Secondary | ICD-10-CM | POA: Diagnosis not present

## 2020-11-14 DIAGNOSIS — E119 Type 2 diabetes mellitus without complications: Secondary | ICD-10-CM | POA: Diagnosis not present

## 2020-11-14 DIAGNOSIS — Z833 Family history of diabetes mellitus: Secondary | ICD-10-CM

## 2020-11-14 DIAGNOSIS — I10 Essential (primary) hypertension: Secondary | ICD-10-CM | POA: Diagnosis not present

## 2020-11-14 DIAGNOSIS — I272 Pulmonary hypertension, unspecified: Secondary | ICD-10-CM | POA: Diagnosis not present

## 2020-11-14 DIAGNOSIS — J9622 Acute and chronic respiratory failure with hypercapnia: Secondary | ICD-10-CM | POA: Diagnosis present

## 2020-11-14 DIAGNOSIS — J9621 Acute and chronic respiratory failure with hypoxia: Secondary | ICD-10-CM | POA: Diagnosis not present

## 2020-11-14 DIAGNOSIS — Z7952 Long term (current) use of systemic steroids: Secondary | ICD-10-CM | POA: Diagnosis not present

## 2020-11-14 DIAGNOSIS — Z823 Family history of stroke: Secondary | ICD-10-CM | POA: Diagnosis not present

## 2020-11-14 DIAGNOSIS — Z7189 Other specified counseling: Secondary | ICD-10-CM | POA: Diagnosis not present

## 2020-11-14 DIAGNOSIS — R062 Wheezing: Secondary | ICD-10-CM | POA: Diagnosis not present

## 2020-11-14 DIAGNOSIS — J90 Pleural effusion, not elsewhere classified: Secondary | ICD-10-CM | POA: Diagnosis not present

## 2020-11-14 DIAGNOSIS — I2699 Other pulmonary embolism without acute cor pulmonale: Secondary | ICD-10-CM | POA: Diagnosis not present

## 2020-11-14 DIAGNOSIS — R0602 Shortness of breath: Secondary | ICD-10-CM | POA: Diagnosis not present

## 2020-11-14 DIAGNOSIS — I2721 Secondary pulmonary arterial hypertension: Secondary | ICD-10-CM | POA: Diagnosis not present

## 2020-11-14 DIAGNOSIS — J449 Chronic obstructive pulmonary disease, unspecified: Secondary | ICD-10-CM | POA: Diagnosis not present

## 2020-11-14 DIAGNOSIS — R6889 Other general symptoms and signs: Secondary | ICD-10-CM | POA: Diagnosis not present

## 2020-11-14 DIAGNOSIS — R069 Unspecified abnormalities of breathing: Secondary | ICD-10-CM | POA: Diagnosis not present

## 2020-11-14 DIAGNOSIS — I499 Cardiac arrhythmia, unspecified: Secondary | ICD-10-CM | POA: Diagnosis not present

## 2020-11-14 DIAGNOSIS — J440 Chronic obstructive pulmonary disease with acute lower respiratory infection: Secondary | ICD-10-CM | POA: Diagnosis not present

## 2020-11-14 DIAGNOSIS — Z20822 Contact with and (suspected) exposure to covid-19: Secondary | ICD-10-CM | POA: Diagnosis not present

## 2020-11-14 DIAGNOSIS — Z79899 Other long term (current) drug therapy: Secondary | ICD-10-CM | POA: Diagnosis not present

## 2020-11-14 DIAGNOSIS — J80 Acute respiratory distress syndrome: Secondary | ICD-10-CM | POA: Diagnosis not present

## 2020-11-14 DIAGNOSIS — Z743 Need for continuous supervision: Secondary | ICD-10-CM | POA: Diagnosis not present

## 2020-11-14 DIAGNOSIS — J9620 Acute and chronic respiratory failure, unspecified whether with hypoxia or hypercapnia: Secondary | ICD-10-CM | POA: Diagnosis present

## 2020-11-14 DIAGNOSIS — Z7982 Long term (current) use of aspirin: Secondary | ICD-10-CM

## 2020-11-14 DIAGNOSIS — Z888 Allergy status to other drugs, medicaments and biological substances status: Secondary | ICD-10-CM

## 2020-11-14 DIAGNOSIS — R410 Disorientation, unspecified: Secondary | ICD-10-CM | POA: Diagnosis not present

## 2020-11-14 DIAGNOSIS — I270 Primary pulmonary hypertension: Secondary | ICD-10-CM | POA: Diagnosis not present

## 2020-11-14 DIAGNOSIS — R0689 Other abnormalities of breathing: Secondary | ICD-10-CM | POA: Diagnosis not present

## 2020-11-14 DIAGNOSIS — M069 Rheumatoid arthritis, unspecified: Secondary | ICD-10-CM | POA: Diagnosis not present

## 2020-11-14 DIAGNOSIS — I700 Atherosclerosis of aorta: Secondary | ICD-10-CM | POA: Diagnosis not present

## 2020-11-14 DIAGNOSIS — Z515 Encounter for palliative care: Secondary | ICD-10-CM | POA: Diagnosis not present

## 2020-11-14 DIAGNOSIS — J849 Interstitial pulmonary disease, unspecified: Secondary | ICD-10-CM | POA: Diagnosis not present

## 2020-11-14 LAB — CBC WITH DIFFERENTIAL/PLATELET
Abs Immature Granulocytes: 0.03 10*3/uL (ref 0.00–0.07)
Basophils Absolute: 0 10*3/uL (ref 0.0–0.1)
Basophils Relative: 1 %
Eosinophils Absolute: 0.1 10*3/uL (ref 0.0–0.5)
Eosinophils Relative: 1 %
HCT: 40.7 % (ref 39.0–52.0)
Hemoglobin: 12.3 g/dL — ABNORMAL LOW (ref 13.0–17.0)
Immature Granulocytes: 0 %
Lymphocytes Relative: 8 %
Lymphs Abs: 0.7 10*3/uL (ref 0.7–4.0)
MCH: 31.9 pg (ref 26.0–34.0)
MCHC: 30.2 g/dL (ref 30.0–36.0)
MCV: 105.7 fL — ABNORMAL HIGH (ref 80.0–100.0)
Monocytes Absolute: 0.8 10*3/uL (ref 0.1–1.0)
Monocytes Relative: 10 %
Neutro Abs: 6.7 10*3/uL (ref 1.7–7.7)
Neutrophils Relative %: 80 %
Platelets: 240 10*3/uL (ref 150–400)
RBC: 3.85 MIL/uL — ABNORMAL LOW (ref 4.22–5.81)
RDW: 16.8 % — ABNORMAL HIGH (ref 11.5–15.5)
WBC: 8.4 10*3/uL (ref 4.0–10.5)
nRBC: 0.5 % — ABNORMAL HIGH (ref 0.0–0.2)

## 2020-11-14 LAB — URINALYSIS, ROUTINE W REFLEX MICROSCOPIC
Bacteria, UA: NONE SEEN
Bilirubin Urine: NEGATIVE
Glucose, UA: 50 mg/dL — AB
Hgb urine dipstick: NEGATIVE
Ketones, ur: 20 mg/dL — AB
Leukocytes,Ua: NEGATIVE
Nitrite: NEGATIVE
Protein, ur: 30 mg/dL — AB
Specific Gravity, Urine: 1.019 (ref 1.005–1.030)
pH: 5 (ref 5.0–8.0)

## 2020-11-14 LAB — BLOOD GAS, ARTERIAL
Acid-base deficit: 2.3 mmol/L — ABNORMAL HIGH (ref 0.0–2.0)
Bicarbonate: 21.9 mmol/L (ref 20.0–28.0)
FIO2: 60
O2 Saturation: 89.8 %
Patient temperature: 37
pCO2 arterial: 47.6 mmHg (ref 32.0–48.0)
pH, Arterial: 7.306 — ABNORMAL LOW (ref 7.350–7.450)
pO2, Arterial: 77.3 mmHg — ABNORMAL LOW (ref 83.0–108.0)

## 2020-11-14 LAB — HEMOGLOBIN A1C
Hgb A1c MFr Bld: 7.1 % — ABNORMAL HIGH (ref 4.8–5.6)
Mean Plasma Glucose: 157.07 mg/dL

## 2020-11-14 LAB — COMPREHENSIVE METABOLIC PANEL
ALT: 14 U/L (ref 0–44)
AST: 25 U/L (ref 15–41)
Albumin: 3.6 g/dL (ref 3.5–5.0)
Alkaline Phosphatase: 36 U/L — ABNORMAL LOW (ref 38–126)
Anion gap: 13 (ref 5–15)
BUN: 15 mg/dL (ref 8–23)
CO2: 25 mmol/L (ref 22–32)
Calcium: 8.8 mg/dL — ABNORMAL LOW (ref 8.9–10.3)
Chloride: 101 mmol/L (ref 98–111)
Creatinine, Ser: 0.95 mg/dL (ref 0.61–1.24)
GFR, Estimated: >60 mL/min (ref >60)
Glucose, Bld: 171 mg/dL — ABNORMAL HIGH (ref 70–99)
Potassium: 4.3 mmol/L (ref 3.5–5.1)
Sodium: 139 mmol/L (ref 135–145)
Total Bilirubin: 1 mg/dL (ref 0.3–1.2)
Total Protein: 7.4 g/dL (ref 6.5–8.1)

## 2020-11-14 LAB — LACTIC ACID, PLASMA
Lactic Acid, Venous: 3.7 mmol/L (ref 0.5–1.9)
Lactic Acid, Venous: 5.3 mmol/L (ref 0.5–1.9)

## 2020-11-14 LAB — SEDIMENTATION RATE: Sed Rate: 45 mm/hr — ABNORMAL HIGH (ref 0–16)

## 2020-11-14 LAB — BRAIN NATRIURETIC PEPTIDE: B Natriuretic Peptide: 98 pg/mL (ref 0.0–100.0)

## 2020-11-14 LAB — POC SARS CORONAVIRUS 2 AG -  ED: SARS Coronavirus 2 Ag: NEGATIVE

## 2020-11-14 LAB — PROTIME-INR
INR: 1.1 (ref 0.8–1.2)
Prothrombin Time: 14.1 seconds (ref 11.4–15.2)

## 2020-11-14 LAB — RESP PANEL BY RT-PCR (FLU A&B, COVID) ARPGX2
Influenza A by PCR: NEGATIVE
Influenza B by PCR: NEGATIVE
SARS Coronavirus 2 by RT PCR: NEGATIVE

## 2020-11-14 LAB — GLUCOSE, CAPILLARY: Glucose-Capillary: 254 mg/dL — ABNORMAL HIGH (ref 70–99)

## 2020-11-14 LAB — PROCALCITONIN: Procalcitonin: 0.41 ng/mL

## 2020-11-14 LAB — APTT: aPTT: 28 seconds (ref 24–36)

## 2020-11-14 MED ORDER — SODIUM CHLORIDE 0.9 % IV SOLN: INTRAVENOUS | Status: DC

## 2020-11-14 MED ORDER — METOPROLOL TARTRATE 5 MG/5ML IV SOLN
2.5000 mg | INTRAVENOUS | Status: DC | PRN
Start: 2020-11-14 — End: 2020-11-22

## 2020-11-14 MED ORDER — VANCOMYCIN HCL 1750 MG/350ML IV SOLN
1750.0000 mg | Freq: Once | INTRAVENOUS | Status: AC
  Administered 2020-11-14: 1750 mg via INTRAVENOUS
  Filled 2020-11-14: qty 350

## 2020-11-14 MED ORDER — INSULIN ASPART 100 UNIT/ML ~~LOC~~ SOLN
0.0000 [IU] | Freq: Three times a day (TID) | SUBCUTANEOUS | Status: DC
  Administered 2020-11-14: 3 [IU] via SUBCUTANEOUS
  Administered 2020-11-15: 2 [IU] via SUBCUTANEOUS
  Administered 2020-11-15 (×2): 5 [IU] via SUBCUTANEOUS
  Administered 2020-11-16: 2 [IU] via SUBCUTANEOUS
  Administered 2020-11-16 (×2): 3 [IU] via SUBCUTANEOUS
  Administered 2020-11-17: 5 [IU] via SUBCUTANEOUS
  Administered 2020-11-17 (×2): 3 [IU] via SUBCUTANEOUS
  Administered 2020-11-18: 11 [IU] via SUBCUTANEOUS
  Administered 2020-11-18: 3 [IU] via SUBCUTANEOUS
  Administered 2020-11-19: 5 [IU] via SUBCUTANEOUS
  Administered 2020-11-19: 8 [IU] via SUBCUTANEOUS
  Administered 2020-11-19: 5 [IU] via SUBCUTANEOUS
  Administered 2020-11-20: 3 [IU] via SUBCUTANEOUS
  Administered 2020-11-20: 15 [IU] via SUBCUTANEOUS
  Administered 2020-11-20: 2 [IU] via SUBCUTANEOUS
  Administered 2020-11-21: 11 [IU] via SUBCUTANEOUS
  Filled 2020-11-14: qty 1

## 2020-11-14 MED ORDER — SODIUM CHLORIDE 0.9 % IV SOLN
1.0000 g | Freq: Once | INTRAVENOUS | Status: AC
  Administered 2020-11-14: 1 g via INTRAVENOUS
  Filled 2020-11-14: qty 10

## 2020-11-14 MED ORDER — IPRATROPIUM-ALBUTEROL 0.5-2.5 (3) MG/3ML IN SOLN
3.0000 mL | Freq: Once | RESPIRATORY_TRACT | Status: AC
  Administered 2020-11-14: 3 mL via RESPIRATORY_TRACT
  Filled 2020-11-14: qty 3

## 2020-11-14 MED ORDER — VANCOMYCIN HCL 1250 MG/250ML IV SOLN
1250.0000 mg | INTRAVENOUS | Status: DC
  Filled 2020-11-14: qty 250

## 2020-11-14 MED ORDER — CHLORHEXIDINE GLUCONATE 0.12 % MT SOLN
15.0000 mL | Freq: Two times a day (BID) | OROMUCOSAL | Status: DC
  Administered 2020-11-14 – 2020-11-17 (×6): 15 mL via OROMUCOSAL
  Filled 2020-11-14 (×3): qty 15

## 2020-11-14 MED ORDER — SODIUM CHLORIDE 0.9 % IV BOLUS
1000.0000 mL | Freq: Once | INTRAVENOUS | Status: AC
  Administered 2020-11-14: 1000 mL via INTRAVENOUS

## 2020-11-14 MED ORDER — ALBUTEROL SULFATE (2.5 MG/3ML) 0.083% IN NEBU
2.5000 mg | INHALATION_SOLUTION | Freq: Once | RESPIRATORY_TRACT | Status: AC
  Administered 2020-11-14: 2.5 mg via RESPIRATORY_TRACT
  Filled 2020-11-14: qty 3

## 2020-11-14 MED ORDER — HEPARIN SODIUM (PORCINE) 5000 UNIT/ML IJ SOLN
5000.0000 [IU] | Freq: Three times a day (TID) | INTRAMUSCULAR | Status: DC
  Administered 2020-11-14 – 2020-11-22 (×23): 5000 [IU] via SUBCUTANEOUS
  Filled 2020-11-14 (×23): qty 1

## 2020-11-14 MED ORDER — SODIUM CHLORIDE 0.9 % IV SOLN
500.0000 mg | Freq: Once | INTRAVENOUS | Status: AC
  Administered 2020-11-14: 500 mg via INTRAVENOUS
  Filled 2020-11-14: qty 500

## 2020-11-14 MED ORDER — PANTOPRAZOLE SODIUM 40 MG PO TBEC: 40.0000 mg | DELAYED_RELEASE_TABLET | Freq: Two times a day (BID) | ORAL | Status: DC

## 2020-11-14 MED ORDER — METHYLPREDNISOLONE SODIUM SUCC 125 MG IJ SOLR
250.0000 mg | INTRAMUSCULAR | Status: AC
  Administered 2020-11-14: 250 mg via INTRAVENOUS
  Filled 2020-11-14: qty 4

## 2020-11-14 MED ORDER — PANTOPRAZOLE SODIUM 40 MG IV SOLR
40.0000 mg | INTRAVENOUS | Status: DC
  Administered 2020-11-14 – 2020-11-16 (×3): 40 mg via INTRAVENOUS
  Filled 2020-11-14 (×3): qty 40

## 2020-11-14 MED ORDER — ORAL CARE MOUTH RINSE
15.0000 mL | Freq: Two times a day (BID) | OROMUCOSAL | Status: DC
  Administered 2020-11-15 – 2020-11-17 (×5): 15 mL via OROMUCOSAL

## 2020-11-14 NOTE — ED Provider Notes (Signed)
[facility] Provider Note   CSN: [id number] Arrival date & time: 11/14/20  1225     History Chief Complaint  Patient presents with  . Respiratory Distress    [name] is a 75 y.o. male.  Patient has a history of pulmonary fibrosis and is usually on 3 L of oxygen he has become more short of breath and when the paramedics arrived his sats were about 48%.  He was given steroids neb treatments and magnesium by the paramedics.  The history is provided by the patient and medical records. No language interpreter was used.  Shortness of Breath Severity:  Moderate Onset quality:  Sudden Timing:  Constant Progression:  Worsening Chronicity:  New Context: not activity   Relieved by:  Nothing Worsened by:  Nothing Ineffective treatments:  None tried Associated symptoms: no abdominal pain, no chest pain, no cough, no headaches and no rash        Past Medical History:  Diagnosis Date  . Arthritis   . COPD (chronic obstructive pulmonary disease) ([place])   . Diabetes mellitus   . Hypertension   . Pneumonia   . Thyroid disease     Patient Active Problem List   Diagnosis Date Noted  . Acute on chronic respiratory failure ([place]) 02/17/2020  . Aortic atherosclerosis ([place]) 08/01/2019  . Shortness of breath 08/01/2019  . Therapeutic drug monitoring 05/30/2019  . Chronic respiratory failure with hypoxia ([place]) 05/30/2019  . Nocturnal hypoxemia 04/26/2019  . Medication management 04/26/2019  . Drug reaction 10/18/2018  . Vomiting without nausea 10/18/2018  . Wheezing on auscultation 01/01/2017  . Nodule of right lung 07/29/2015  . Coronary artery calcification 02/11/2014  . Caries 01/18/2014  . Chronic cough 07/01/2013  . Obesity (BMI 30.0-34.9) 07/26/2012  . 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 Pneumonia 12/15/2011  . HTN (hypertension) 12/15/2011  . Rheumatoid arthritis ([place]) 12/15/2011  . Hypothyroidism 12/15/2011    Past  Surgical History:  Procedure Laterality Date  . KNEE ARTHROSCOPY    . NO PAST SURGERIES    . RIGHT/LEFT HEART CATH AND CORONARY ANGIOGRAPHY N/A 05/21/2020   Procedure: RIGHT/LEFT HEART CATH AND CORONARY ANGIOGRAPHY;  Surgeon: Martinique, Peter M, MD;  Location: [facility];  Service: Cardiovascular;  Laterality: N/A;       Family History  Problem Relation Age of Onset  . Diabetes Mother   . Stroke Mother     Social History   Tobacco Use  . Smoking status: Former Smoker    Packs/day: 0.50    Years: 10.00    Pack years: 5.00    Types: Cigarettes    Quit date: 12/03/1984    Years since quitting: 35.9  . Smokeless tobacco: Never Used  . Tobacco comment: Quit 40 years ago.   Substance Use Topics  . Alcohol use: No  . Drug use: No    Home Medications Prior to Admission medications   Medication Sig Start Date End Date Taking? Authorizing Provider  acetaminophen (TYLENOL) 500 MG tablet Take 1,000 mg by mouth every 6 (six) hours as needed for moderate pain or headache.    [provider]  amLODipine (NORVASC) 10 MG tablet Take 10 mg by mouth daily.    [provider]  aspirin 81 MG tablet Take 81 mg by mouth daily.    [provider]  atorvastatin (LIPITOR) 20 MG tablet Take 20 mg by mouth daily. 08/19/18   [provider]  azaTHIOprine (IMURAN) 50 MG tablet Take 100 mg by mouth daily.  04/13/19   [provider]  benzonatate (TESSALON) 100 MG capsule Take 2 capsules (200 mg total) by mouth 3 (three) times daily as needed for cough. 11/08/20   Brand Males, MD  fluticasone (FLONASE) 50 MCG/ACT nasal spray Place 2 sprays into both nostrils daily. 11/08/20   Brand Males, MD  insulin NPH Human (NOVOLIN N) 100 UNIT/ML injection Inject 12 Units into the skin daily.     [provider]  Insulin Syringe-Needle U-100 (INSULIN SYRINGE .3CC/31GX5/16") 31G X 5/16" 0.3 ML MISC Inject 1 Syringe into the skin as directed. 02/27/20    [provider]  levothyroxine (SYNTHROID, LEVOTHROID) 25 MCG tablet Take 25 mcg by mouth daily.    [provider]  metFORMIN (GLUCOPHAGE-XR) 500 MG 24 hr tablet Take 2 tablets (1,000 mg total) by mouth 2 (two) times daily. 05/24/20   Martinique, Peter M, MD  ondansetron (ZOFRAN) 4 MG tablet Take 1 tablet (4 mg total) by mouth 3 (three) times daily as needed for nausea or vomiting. 05/01/20   Brand Males, MD  pantoprazole (PROTONIX) 40 MG tablet Take 40 mg by mouth daily. 01/14/20   [provider]  predniSONE (DELTASONE) 1 MG tablet Take 3 mg by mouth daily.     [provider]  Tiotropium Bromide Monohydrate (SPIRIVA RESPIMAT) 2.5 MCG/ACT AERS Inhale 2 puffs into the lungs daily. 10/30/20   Brand Males, MD    Allergies    Bactrim [sulfamethoxazole-trimethoprim], Pirfenidone, Spiriva respimat [tiotropium bromide monohydrate], and Ofev [nintedanib]  Review of Systems   Review of Systems  Constitutional: Negative for appetite change and fatigue.  HENT: Negative for congestion, ear discharge and sinus pressure.   Eyes: Negative for discharge.  Respiratory: Positive for shortness of breath. Negative for cough.   Cardiovascular: Negative for chest pain.  Gastrointestinal: Negative for abdominal pain and diarrhea.  Genitourinary: Negative for frequency and hematuria.  Musculoskeletal: Negative for back pain.  Skin: Negative for rash.  Neurological: Negative for seizures and headaches.  Psychiatric/Behavioral: Negative for hallucinations.    Physical Exam Updated Vital Signs BP (!) 144/69   Pulse (!) 117   Temp 99.1 F (37.3 C) (Axillary)   Resp (!) 34   Ht _0  (1.676 m)   Wt 72.6 kg   SpO2 96%   BMI 25.82 kg/m   Physical Exam Vitals and nursing note reviewed.  Constitutional:      General: [name] in acute distress.     Appearance: [name] well-developed.  HENT:     Head: Normocephalic.     Nose: Nose normal.     Mouth/Throat:      Mouth: Mucous membranes are moist.  Eyes:     General: No scleral icterus.    Extraocular Movements: EOM normal.     Conjunctiva/sclera: Conjunctivae normal.  Neck:     Thyroid: No thyromegaly.  Cardiovascular:     Rate and Rhythm: Normal rate and regular rhythm.     Heart sounds: No murmur heard. No friction rub. No gallop.   Pulmonary:     Breath sounds: No stridor. Wheezing present. No rales.  Chest:     Chest wall: No tenderness.  Abdominal:     General: There is no distension.     Tenderness: There is no abdominal tenderness. There is no rebound.  Musculoskeletal:        General: No edema. Normal range of motion.     Cervical  back: Neck supple.  Lymphadenopathy:     Cervical: No cervical adenopathy.  Skin:    Findings: No erythema or rash.  Neurological:     Mental Status: [name] alert and oriented to person, place, and time.     Motor: No abnormal muscle tone.     Coordination: Coordination normal.  Psychiatric:        Mood and Affect: Mood and affect normal.        Behavior: Behavior normal.     ED Results / Procedures / Treatments   Labs (all labs ordered are listed, but only abnormal results are displayed) Labs Reviewed  LACTIC ACID, PLASMA - Abnormal; Notable for the following components:      Result Value   Lactic Acid, Venous 3.7 (*)    All other components within normal limits  COMPREHENSIVE METABOLIC PANEL - Abnormal; Notable for the following components:   Glucose, Bld 171 (*)    Calcium 8.8 (*)    Alkaline Phosphatase 36 (*)    All other components within normal limits  CBC WITH DIFFERENTIAL/PLATELET - Abnormal; Notable for the following components:   RBC 3.85 (*)    Hemoglobin 12.3 (*)    MCV 105.7 (*)    RDW 16.8 (*)    nRBC 0.5 (*)    All other components within normal limits  CULTURE, BLOOD (SINGLE)  RESP PANEL BY RT-PCR (FLU A&B, COVID) ARPGX2  URINE CULTURE  MRSA PCR SCREENING  PROTIME-INR  APTT  BRAIN NATRIURETIC PEPTIDE  LACTIC  ACID, PLASMA  URINALYSIS, ROUTINE W REFLEX MICROSCOPIC  BLOOD GAS, ARTERIAL  POC SARS CORONAVIRUS 2 AG -  ED    EKG None  Radiology DG Chest Port 1 View  Result Date: 11/14/2020 CLINICAL DATA:  Shortness of breath, COPD, pulmonary fibrosis, pending COVID-19 test EXAM: PORTABLE CHEST 1 VIEW COMPARISON:  Portable exam 1243 hours compared to 02/17/2020 FINDINGS: Normal heart size, mediastinal contours, and pulmonary vascularity. Extensive chronic interstitial changes throughout both lungs, least severe in the LEFT upper lobe. Additional new infiltrates in the mid to lower lungs bilaterally favoring superimposed acute pneumonia, less likely aspiration. No pleural effusion or pneumothorax. Mild elevation of RIGHT diaphragm. Bones demineralized. IMPRESSION: Pulmonary fibrosis with superimposed acute infiltrates in the mid to lower lungs favor pneumonia, including COVID-19, less likely aspiration. Electronically Signed   By: Lavonia Dana [name]   On: 11/14/2020 13:04    Procedures Procedures (including critical care time)  Medications Ordered in ED Medications  azithromycin (ZITHROMAX) 500 mg in sodium chloride 0.9 % 250 mL IVPB (500 mg Intravenous New Bag/Given 11/14/20 1328)  vancomycin (VANCOREADY) IVPB 1750 mg/350 mL (1,750 mg Intravenous New Bag/Given 11/14/20 1331)  vancomycin (VANCOREADY) IVPB 1250 mg/250 mL (has no administration in time range)  ipratropium-albuterol (DUONEB) 0.5-2.5 (3) MG/3ML nebulizer solution 3 mL (3 mLs Nebulization Given 11/14/20 1253)  albuterol (PROVENTIL) (2.5 MG/3ML) 0.083% nebulizer solution 2.5 mg (2.5 mg Nebulization Given 11/14/20 1253)  cefTRIAXone (ROCEPHIN) 1 g in sodium chloride 0.9 % 100 mL IVPB (0 g Intravenous Stopped 11/14/20 1411)  sodium chloride 0.9 % bolus 1,000 mL (1,000 mLs Intravenous New Bag/Given 11/14/20 1313)  sodium chloride 0.9 % bolus 1,000 mL (1,000 mLs Intravenous New Bag/Given 11/14/20 1417)    ED Course  I have reviewed the triage vital  signs and the nursing notes.  Pertinent labs & imaging results that were available during my care of the patient were reviewed by me and considered in my medical decision making (see chart  for details).    CRITICAL CARE Performed by: Milton Ferguson Total critical care time: 65 minutes Critical care time was exclusive of separately billable procedures and treating other patients. Critical care was necessary to treat or prevent imminent or life-threatening deterioration. Critical care was time spent personally by me on the following activities: development of treatment plan with patient and/or surrogate as well as nursing, discussions with consultants, evaluation of patient's response to treatment, examination of patient, obtaining history from patient or surrogate, ordering and performing treatments and interventions, ordering and review of laboratory studies, ordering and review of radiographic studies, pulse oximetry and re-evaluation of patient's condition. Patient was placed on BiPAP for shortness of breath.  He was on 40% oxygen but then coughed and became a little more short of breath and his sats dropped.  He was then put on 80% and was doing fine and we have dropped him down to 60% and he is going to get a blood gas.  Patient does not look in significant distress.  I have spoke with Dr. Melvyn Novas critical care and he will come see the patient after he finishes in clinic.  He states that the patient may need to be transferred to [facility] and the patient may need to be intubated but we will see what blood gas shows in his evaluation when he sees the patient MDM Rules/Calculators/A&P                         Patient with bilateral pneumonia pulmonary fibrosis with significant hypoxia.    At a later time patient was seen by critical care and transferred to [facility] on BiPAP Final Clinical Impression(s) / ED Diagnoses Final diagnoses:  None    Rx / DC Orders ED Discharge Orders    None        Milton Ferguson, MD 11/16/20 1015

## 2020-11-14 NOTE — Telephone Encounter (Signed)
Ok thanks 

## 2020-11-14 NOTE — ED Provider Notes (Signed)
I have discussed the case with Dr. Melvyn Novas of the pulmonology service who agrees that the patient should be transported to [facility] to the intensive care unit.  I have also discussed the care with Dr. Lynetta Mare of the ICU at [facility] who has accepted the patient in transport and the patient should be transported within the next several hours as there is a bed available according to [facility].  The patient is currently requiring BiPAP but mental status is good and ABG is reassuring, lactic acid is elevating but the patient has had appropriate antibiotics.,  COVID-negative, white blood cells normal, asked by pulmonary critical care to add procalcitonin and a formal respiratory panel.  Temporary holding orders placed at 4:10 PM   Noemi Chapel, MD 11/14/20 1611

## 2020-11-14 NOTE — Telephone Encounter (Signed)
Called pt to inform of Dr. Golden Pop recommendations. Male that answered phone stated pt O2 saturation levels were in the 50's and EMS was currently at pt's home to transport him to hospital.  Routing to Dr. Chase Caller as Juluis Rainier

## 2020-11-14 NOTE — Telephone Encounter (Signed)
Ok to  Place one that goes upto [employer]

## 2020-11-14 NOTE — ED Notes (Signed)
Report given to 4NICU

## 2020-11-14 NOTE — Progress Notes (Signed)
Pharmacy Antibiotic Note  [name] is a 75 y.o. male admitted on 11/14/2020 with pneumonia.  Pharmacy has been consulted for Vancomycin dosing.  Plan: Vancomycin 1750 mg IV x 1 dose. Vancomycin 1250 mg IV every 24 hours. Expected AUC 481. Monitor labs, c/s, and de-escalation.  Height: _0  (167.6 cm) Weight: 72.6 kg (160 lb) IBW/kg (Calculated) : 63.8  Temp (24hrs), Avg:99.1 F (37.3 C), Min:99.1 F (37.3 C), Max:99.1 F (37.3 C)  Recent Labs  Lab 11/14/20 1233  WBC 8.4  CREATININE 0.95    Estimated Creatinine Clearance: 60.6 mL/min (by C-G formula based on SCr of 0.95 mg/dL).    Allergies  Allergen Reactions  . Bactrim [Sulfamethoxazole-Trimethoprim] Shortness Of Breath    Patient states "got short winded"  . Pirfenidone Rash  . Spiriva Respimat [Tiotropium Bromide Monohydrate] Rash  . Ofev [Nintedanib]     Antimicrobials this admission: Vanco 1/13 >>  CTX/Azith 1/13 >>   Microbiology results: 1/13 BCx: pending 1/13 UCx: pending  1/13 MRSA PCR: pending  Thank you for allowing pharmacy to be a part of this patient's care.  Ramond Craver 11/14/2020 2:03 PM

## 2020-11-14 NOTE — Progress Notes (Addendum)
[facility] Physician-Brief Progress Note Patient Name: Ganesh Deeg DOB: [birth year] MRN: [id number]   Date of Service  11/14/2020  HPI/Events of Note  74/M with RA with ILD, restrictive lung disese, DM, hypertension, transferred from an outside facility due to worsening respiratory distress. Pt tolerating BIPAP.  COVID negative. ABG 7.306/47.6/77.3 LA 5.3 procal 0.41 CXR - pulmonary fibrosis with superimposed acute infiltrates in the mid to lower lungs favor pneumonia.   A/P> Acute on chronic respiratory failure, on BIPAP Pneumonia Acute exacerbation  Lactic acidosis   eICU Interventions  Continue on empiric antibiotics and IV steroids.  Check respiratory viral panel.  Continue neb treatments.  Heparin for DVT prophylaxis. Pt on PPI. Continue to trend lactate. Start on NS. Bedside team informed of patient admission.      Intervention Category Evaluation Type: New Patient Evaluation  Elsie Lincoln 11/14/2020, 8:35 PM

## 2020-11-14 NOTE — Progress Notes (Signed)
Patient arrived from [facility] stable on BiPAP. Seen and examined at bedside. He has been sick for 2 days; no previous cold or URI symptoms. No productive cough. No current meds for pulmonary fibrosis as an outpatient. He was intolerant to antifibrotics due to a rash. Currently he denies complaints. BiPAP settings 12/6, 60% with Vt ~900ccc. He was able to be titrated down to 10/6, 50% with saturations in low 90s. Breathing comfortable, no increased WOB. Rhales bilaterally.  Discussed goals of care. He understands that if his respiratory failure deteriorated to the point of requiring MV he would have low chance of being able to survive an IPF flare. I have encouraged him to consider talking to his family about his goals.  Julian Hy, DO 11/14/20 9:09 PM  Pulmonary & Critical Care

## 2020-11-14 NOTE — Progress Notes (Signed)
Updated [facility] to let them know patient is on the unit.

## 2020-11-14 NOTE — ED Triage Notes (Signed)
Pt brought to ED via [facility], O2 sat initially 48% on 4L, pt had 3 duonebs, 125 mg solumedrol, mag 2 grams IV. Pt with hx COPD. Pt alert and oriented.

## 2020-11-14 NOTE — ED Notes (Signed)
Date and time results received: 11/14/20 1419 (use smartphrase ".now" to insert current time)  Test: Lactic Acid Critical Value: 3.7  Name of Provider Notified: Roderic Palau, MD  Orders Received? Or Actions Taken?:

## 2020-11-14 NOTE — Progress Notes (Signed)
NAME:  [name], MRN:  [id number], DOB:  06/19/1946, LOS: 0 ADMISSION DATE:  11/14/2020, CONSULTATION DATE:  1/13 REFERRING MD:  EDP/ Zammit, CHIEF COMPLAINT:  resp distress   Brief History:  13 yobm remote smoker > 6 m since [place] with RA/ PF 02 dep at baseline  f/b Ramaswamy/ [facility] brought in by ambulance with sats intially 48% on 4lpm though improved on bipap and PCCM services asked to consult pm 1/13    History of Present Illness:  Unable to obtain hx due to resp extremis - per Ramaswamy's office note he is very stoic and typically does not give reliable symptom hx even when seen in office but overall pattern has been one of progressive dz and intolerance to multiple PF /copd meds   Past Medical History:  Arthritis RA with ILD   COPD on list  though most of pfts are restrictive with no curvature on f/v loop Diabetes mellitus Hypertension Pneumonia Thyroid disease.    Significant Hospital Events:   Placed on bipap in ER initially  Consults:  PCCM  Procedures:    Significant Diagnostic Tests:    Micro Data:  Resp viral panel/ PCR  1/13 for covid / flu both neg MRSA  PCR  1/13>>> BC x 1  1/13  >>>    Antimicrobials:  Vancomycin 1/13 Rocephin 1/13 Zmax 1/13   Interim History / Subjective:  Feeling much better since admit, says he's here cause he ran out of 02  - denies cough or cp N or V or increase arthitis symptoms/ leg swelling   Objective   Blood pressure (!) 155/69, pulse (!) 111, temperature 99.1 F (37.3 C), temperature source Axillary, resp. rate (!) 33, height 5' 6" (1.676 m), weight 72.6 kg, SpO2 94 %.    FiO2 (%):  [60 %-80 %] 60 %   Intake/Output Summary (Last 24 hours) at 11/14/2020 1512 Last data filed at 11/14/2020 1411 Gross per 24 hour  Intake 100 ml  Output --  Net 100 ml   Filed Weights   11/14/20 1229  Weight: 72.6 kg    Examination: General: Tmax 99.1  HENT: wearing bipap, tol well  Lungs: diffuse coarse insp  crackles Cardiovascular: ? s3 / sinus tach Abdomen: soft/ benignt Extremities: warm s deformities/ no calf tenderness or edema Neuro: intact sensorium no deficits      I personally reviewed images and agree with radiology impression as follows:  CXR:   Portable   1/13 Pulmonary fibrosis with superimposed acute infiltrates in the mid to lower lungs favor pneumonia, including COVID-19, less likely aspiration.  Resolved Hospital Problem list      Assessment & Plan:  Acute on chronic resp failure with hypercabia and hypoxemia, usually a very bad sign in pt with PF but looks surprisingly good on bipap so ? Whether PNA? CHF contributing >>> empiric abx/ serial pct  >>> check esr/ solumedrol 80 mg IV q 12 h  HBP poor control/ did not take meds today >>> lopressor favored over vasodilators as already tachycardic  AODM >> SSI  Best practice (evaluated daily)  Diet: npo for now  Pain/Anxiety/Delirium protocol (if indicated): n/a VAP protocol (if indicated): n/a DVT prophylaxis: sq hep GI prophylaxis: ppi  Glucose control: n/a  Mobility: bed rest for now  Disposition: to ICU  [facility] if bed, if not admit here   Goals of Care:  Last date of multidisciplinary goals of care discussion: Family and staff present:  Summary of discussion:  Follow  up goals of care discussion due:  Code Status: FULL code, avoid et if possible though does not appear endstage to me  Labs   CBC: Recent Labs  Lab 11/14/20 1233  WBC 8.4  NEUTROABS 6.7  HGB 12.3*  HCT 40.7  MCV 105.7*  PLT 240    Basic Metabolic Panel: Recent Labs  Lab 11/14/20 1233  NA 139  K 4.3  CL 101  CO2 25  GLUCOSE 171*  BUN 15  CREATININE 0.95  CALCIUM 8.8*   GFR: Estimated Creatinine Clearance: 60.6 mL/min (by C-G formula based on SCr of 0.95 mg/dL). Recent Labs  Lab 11/14/20 1233 11/14/20 1325  WBC 8.4  --   LATICACIDVEN  --  3.7*    Liver Function Tests: Recent Labs  Lab 11/14/20 1233  AST 25   ALT 14  ALKPHOS 36*  BILITOT 1.0  PROT 7.4  ALBUMIN 3.6   No results for input(s): LIPASE, AMYLASE in the last 168 hours. No results for input(s): AMMONIA in the last 168 hours.  ABG    Component Value Date/Time   PHART 7.306 (L) 11/14/2020 1436   PCO2ART 47.6 11/14/2020 1436   PO2ART 77.3 (L) 11/14/2020 1436   HCO3 21.9 11/14/2020 1436   TCO2 31 05/21/2020 1034   ACIDBASEDEF 2.3 (H) 11/14/2020 1436   O2SAT 89.8 11/14/2020 1436     Coagulation Profile: Recent Labs  Lab 11/14/20 1233  INR 1.1    Cardiac Enzymes: No results for input(s): CKTOTAL, CKMB, CKMBINDEX, TROPONINI in the last 168 hours.  HbA1C: Hgb A1c MFr Bld  Date/Time Value Ref Range Status  12/15/2011 06:55 PM 7.8 (H) <5.7 % Final    Comment:    (NOTE)                                                                       According to the ADA Clinical Practice Recommendations for 2011, when HbA1c is used as a screening test:  >=6.5%   Diagnostic of Diabetes Mellitus           (if abnormal result is confirmed) 5.7-6.4%   Increased risk of developing Diabetes Mellitus References:Diagnosis and Classification of Diabetes Mellitus,Diabetes Care,2011,34(Suppl 1):S62-S69 and Standards of Medical Care in         Diabetes - 2011,Diabetes Care,2011,34 (Suppl 1):S11-S61.    CBG: No results for input(s): GLUCAP in the last 168 hours.     Past Medical History:  He,  has a past medical history of Arthritis, COPD (chronic obstructive pulmonary disease) (HCC), Diabetes mellitus, Hypertension, Pneumonia, and Thyroid disease.   Surgical History:   Past Surgical History:  Procedure Laterality Date  . KNEE ARTHROSCOPY    . NO PAST SURGERIES    . RIGHT/LEFT HEART CATH AND CORONARY ANGIOGRAPHY N/A 05/21/2020   Procedure: RIGHT/LEFT HEART CATH AND CORONARY ANGIOGRAPHY;  Surgeon: Jordan, Peter M, MD;  Location: [facility] INVASIVE [facility] LAB;  Service: Cardiovascular;  Laterality: N/A;     Social History:   reports that he  quit smoking about 35 years ago. His smoking use included cigarettes. He has a 5.00 pack-year smoking history. He has never used smokeless tobacco. He reports that he does not drink alcohol and does not use drugs.   Family History:    His family history includes Diabetes in his mother; Stroke in his mother.   Allergies Allergies  Allergen Reactions  . Bactrim [Sulfamethoxazole-Trimethoprim] Shortness Of Breath    Patient states "got short winded"  . Pirfenidone Rash  . Spiriva Respimat [Tiotropium Bromide Monohydrate] Rash  . Ofev [Nintedanib]      Home Medications  Prior to Admission medications  - - NOTE:   Unable to verify as accurately reflecting what pt takes     Medication Sig Start Date End Date Taking? Authorizing Provider  albuterol (PROAIR HFA) 108 (90 Base) MCG/ACT inhaler 2 puffs as needed 11/22/17  Yes [provider]  acetaminophen (TYLENOL) 500 MG tablet Take 1,000 mg by mouth every 6 (six) hours as needed for moderate pain or headache.    [provider]  amLODipine (NORVASC) 10 MG tablet Take 10 mg by mouth daily.    [provider]  aspirin 81 MG tablet Take 81 mg by mouth daily.    [provider]  atorvastatin (LIPITOR) 20 MG tablet Take 20 mg by mouth daily. 08/19/18   [provider]  azaTHIOprine (IMURAN) 50 MG tablet Take 100 mg by mouth daily.  04/13/19   [provider]  benzonatate (TESSALON) 100 MG capsule Take 2 capsules (200 mg total) by mouth 3 (three) times daily as needed for cough. 11/08/20   Brand Males, MD  fluticasone (FLONASE) 50 MCG/ACT nasal spray Place 2 sprays into both nostrils daily. 11/08/20   Brand Males, MD  folic acid (FOLVITE) 175 MCG tablet 1 tablet    [provider]  insulin NPH Human (NOVOLIN N) 100 UNIT/ML injection Inject 12 Units into the skin daily.     [provider]  Insulin Syringe-Needle U-100 (INSULIN SYRINGE .3CC/31GX5/16") 31G X 5/16" 0.3 ML MISC  Inject 1 Syringe into the skin as directed. 02/27/20   [provider]  levothyroxine (SYNTHROID, LEVOTHROID) 25 MCG tablet Take 25 mcg by mouth daily.    [provider]  metFORMIN (GLUCOPHAGE-XR) 500 MG 24 hr tablet Take 2 tablets (1,000 mg total) by mouth 2 (two) times daily. 05/24/20   [place], Peter M, MD  ondansetron (ZOFRAN) 4 MG tablet Take 1 tablet (4 mg total) by mouth 3 (three) times daily as needed for nausea or vomiting. 05/01/20   Brand Males, MD  pantoprazole (PROTONIX) 40 MG tablet Take 40 mg by mouth daily. 01/14/20   [provider]  predniSONE (DELTASONE) 1 MG tablet Take 3 mg by mouth daily.     [provider]  Tiotropium Bromide Monohydrate (SPIRIVA RESPIMAT) 2.5 MCG/ACT AERS Inhale 2 puffs into the lungs daily. 10/30/20   Brand Males, MD          The patient is critically ill with multiple organ systems failure and requires high complexity decision making for assessment and support, frequent evaluation and titration of therapies, application of advanced monitoring technologies and extensive interpretation of multiple databases. Critical Care Time devoted to patient care services described in this note is 45 minutes.    Christinia Gully, MD Pulmonary and [place] [phone]   After 7:00 pm call Elink  [phone]

## 2020-11-15 ENCOUNTER — Inpatient Hospital Stay (HOSPITAL_COMMUNITY): Payer: Medicare Other

## 2020-11-15 DIAGNOSIS — J9622 Acute and chronic respiratory failure with hypercapnia: Secondary | ICD-10-CM | POA: Diagnosis not present

## 2020-11-15 DIAGNOSIS — I2721 Secondary pulmonary arterial hypertension: Secondary | ICD-10-CM | POA: Diagnosis not present

## 2020-11-15 DIAGNOSIS — I272 Pulmonary hypertension, unspecified: Secondary | ICD-10-CM | POA: Diagnosis not present

## 2020-11-15 DIAGNOSIS — J9621 Acute and chronic respiratory failure with hypoxia: Secondary | ICD-10-CM | POA: Diagnosis not present

## 2020-11-15 LAB — BLOOD CULTURE ID PANEL (REFLEXED) - BCID2

## 2020-11-15 LAB — RESPIRATORY PANEL BY PCR

## 2020-11-15 LAB — D-DIMER, QUANTITATIVE: D-Dimer, Quant: 3 ug/mL-FEU — ABNORMAL HIGH (ref 0.00–0.50)

## 2020-11-15 LAB — CREATININE, SERUM
Creatinine, Ser: 0.89 mg/dL (ref 0.61–1.24)
GFR, Estimated: >60 mL/min (ref >60)

## 2020-11-15 LAB — ECHOCARDIOGRAM COMPLETE
Height: 66 in
S' Lateral: 3.3 cm
Weight: 2560 oz

## 2020-11-15 LAB — MRSA PCR SCREENING: MRSA by PCR: NEGATIVE

## 2020-11-15 LAB — PROCALCITONIN: Procalcitonin: 0.81 ng/mL

## 2020-11-15 LAB — GLUCOSE, CAPILLARY
Glucose-Capillary: 218 mg/dL — ABNORMAL HIGH (ref 70–99)
Glucose-Capillary: 203 mg/dL — ABNORMAL HIGH (ref 70–99)
Glucose-Capillary: 134 mg/dL — ABNORMAL HIGH (ref 70–99)
Glucose-Capillary: 150 mg/dL — ABNORMAL HIGH (ref 70–99)

## 2020-11-15 LAB — CBG MONITORING, ED: Glucose-Capillary: 197 mg/dL — ABNORMAL HIGH (ref 70–99)

## 2020-11-15 MED ORDER — IOHEXOL 350 MG/ML SOLN
75.0000 mL | Freq: Once | INTRAVENOUS | Status: AC | PRN
  Administered 2020-11-15: 75 mL via INTRAVENOUS

## 2020-11-15 MED ORDER — SODIUM CHLORIDE 0.9 % IV SOLN
500.0000 mg | INTRAVENOUS | Status: AC
  Administered 2020-11-15 – 2020-11-18 (×4): 500 mg via INTRAVENOUS
  Filled 2020-11-15 (×4): qty 500

## 2020-11-15 MED ORDER — HYDRALAZINE HCL 20 MG/ML IJ SOLN: 10.0000 mg | Freq: Four times a day (QID) | INTRAMUSCULAR | Status: DC | PRN

## 2020-11-15 MED ORDER — SODIUM CHLORIDE 0.9 % IV SOLN
2.0000 g | INTRAVENOUS | Status: AC
  Administered 2020-11-15 – 2020-11-18 (×4): 2 g via INTRAVENOUS
  Filled 2020-11-15 (×2): qty 2
  Filled 2020-11-15 (×3): qty 20

## 2020-11-15 MED ORDER — METHYLPREDNISOLONE SODIUM SUCC 125 MG IJ SOLR
80.0000 mg | Freq: Every day | INTRAMUSCULAR | Status: AC
  Administered 2020-11-15 – 2020-11-18 (×4): 80 mg via INTRAVENOUS
  Filled 2020-11-15 (×4): qty 2

## 2020-11-15 MED ORDER — VANCOMYCIN HCL 1500 MG/300ML IV SOLN
1500.0000 mg | INTRAVENOUS | Status: DC
  Filled 2020-11-15: qty 300

## 2020-11-15 MED ORDER — IPRATROPIUM-ALBUTEROL 0.5-2.5 (3) MG/3ML IN SOLN
3.0000 mL | Freq: Four times a day (QID) | RESPIRATORY_TRACT | Status: DC
  Administered 2020-11-15 – 2020-11-20 (×20): 3 mL via RESPIRATORY_TRACT
  Filled 2020-11-15 (×17): qty 3

## 2020-11-15 MED ORDER — BUDESONIDE 0.25 MG/2ML IN SUSP
0.2500 mg | Freq: Two times a day (BID) | RESPIRATORY_TRACT | Status: DC
  Administered 2020-11-15 – 2020-11-22 (×14): 0.25 mg via RESPIRATORY_TRACT
  Filled 2020-11-15 (×14): qty 2

## 2020-11-15 MED ORDER — SODIUM CHLORIDE 0.9 % IV SOLN
500.0000 mg | INTRAVENOUS | Status: DC
  Filled 2020-11-15: qty 500

## 2020-11-15 MED ORDER — CHLORHEXIDINE GLUCONATE CLOTH 2 % EX PADS
6.0000 | MEDICATED_PAD | Freq: Every day | CUTANEOUS | Status: DC
  Administered 2020-11-15 – 2020-11-22 (×8): 6 via TOPICAL

## 2020-11-15 NOTE — Progress Notes (Signed)
   11/15/20 0758  Oxygen Therapy/Pulse Ox  O2 Device HFNC  $ High Flow Nasal Cannula  Yes  O2 Therapy Oxygen humidified  O2 Flow Rate (L/min) 15 L/min  FiO2 (%) 75 %  SpO2 (!) 67 % (Pt changed to a 15 L [place], H20 bottle, HFNC, pt didn't tolerate at all, he dropped to 67% high RR within minutes, placed back on the bipap @ 100%,weaned to 50%, Sp02 improved immediately to 97%, with VTs 900s.  Will continue to monitor, continuous bipa)

## 2020-11-15 NOTE — Progress Notes (Signed)
   11/15/20 1130  BiPAP/CPAP/SIPAP  BiPAP/CPAP/SIPAP Pt Type  (Transported to [place] on V60 with 2 full tanks, 100%, pt tolerated well. SP02 92% during travel with BIPAP 10/6, RR 15, 100%.)

## 2020-11-15 NOTE — Progress Notes (Signed)
  Echocardiogram 2D Echocardiogram has been performed.  [name] 11/15/2020, 11:47 AM

## 2020-11-15 NOTE — Consult Note (Addendum)
Cardiology Consultation:   Patient ID: [name] MRN: [id number]; DOB: 09/16/1946  Admit date: 11/14/2020 Date of Consult: 11/15/2020  Primary Care Provider: Merrilee Seashore, MD [facility] Cardiologist: Minus Breeding, MD  [facility] Electrophysiologist:  None    Patient Profile:   [name] is a 75 y.o. male with a PMH of mild non-obstructive CAD, HTN, DM type 2, COPD, RA, ILD/pulmonary fibrosis, and former tobacco abuse who is being seen today for the evaluation of possible pulmonary hypertension at the request of Dr. Loanne Drilling.  History of Present Illness:   Mr. [name] was brought in to [facility] by EMS with acute on chronic respiratory failure. Patient was noted to have O2 of 48%. He was placed on BiPAP with improvement in O2 sats. Patient reportedly had run out of home O2. He was started on empiric antibiotics and IV steroids given CXR c/w pulmonary fibrosis with superimposed acute infiltrates in the mid-lower lungs suspicious for PNA. He was admitted to [facility] and transferred to [facility] for ongoing care.   Patient was last seen by cardiology at an outpatient visit with Dr. Percival Spanish 05/2020 at which time he had complaints of mild DOE with recent worsening oxygenation, referred to cardiology for [place] by his pulmonologist. Decision made to r/o obstructive disease as well given persistent symptoms and evidence of coronary artery calcifications on CT scan. He underwent a [facility] 05/2020 which showed 20% p-mLAD stenosis, otherwise normal coronary arteries with normal LVEDP and upper normal PA pressures with mean 25 mmHg c/w mild pulmonary hypertension. Echo 06/2020 showed EF 60-65%, G1DD, no RWMA, severely elevated PA pressures with RV systolic pressure of 51.1 mmHg, and moderate TR. He has followed outpatient with pulmonology for management of his ILD and mild pulmonary hypertension (last seen 05/30/20) with plans to consider addition of nebulized treprostinil if symptoms remained stable after  several months of esbriet. Repeat PFTs have not been completed. It appears per Dr. Golden Pop office notes that patient is very stoic and typically does not give reliable symptom history, however has an overall pattern of progressive disease with multiple intolerance to PF/COPD medication.   At the time of this evaluation he appears comfortable on BiPAP. He reports his O2 concentrator started acting up on Tuesday and attempts to fix it on Wednesday were unsuccessful. He called EMS on 11/13/20 due to SOB and was given a breathing treatment. Symptoms significantly worsened 11/14/20 prompting him to call EMS again. In the past several days to weeks leading up to this he reports worsening SOB, DOE, non-productive cough, and orthopnea. No complaints of chest pain, palpitations, LE edema, PND, or fevers. We discussed plans to pursue a RHC this admission pending improvement in his respiratory status. He was in agreement with this plan.   Hospital course: O2 sats stable in the high 80s-90s on BiPAP with drops to 67s on HFNC, tachypneic, tachycardic to the 110s, and intermittently hypertensive, though remains afebrile. Labs notable for electrolytes wnl, Cr 0.95, WBC 8.4, Hgb 12.3, PLT 240, BNP 98, lactate 3.7>5.3, procalcitonin 0.41>0.81, Sed rate^ 45, Ddimer 3.0. COVID 19/influenza negative. CXR showed pulmonary fibrosis with superimposed acute infiltrates in the mid-lower lungs suspicious for PNA. Started on IV antibiotics and steroids. Admitted to PCCM. Cardiology asked to evaluate for pulmonary HTN.    Past Medical History:  Diagnosis Date  . Arthritis   . COPD (chronic obstructive pulmonary disease) ([place])   . Diabetes mellitus   . Hypertension   . Pneumonia   . Thyroid disease  Past Surgical History:  Procedure Laterality Date  . KNEE ARTHROSCOPY    . NO PAST SURGERIES    . RIGHT/LEFT HEART CATH AND CORONARY ANGIOGRAPHY N/A 05/21/2020   Procedure: RIGHT/LEFT HEART CATH AND CORONARY ANGIOGRAPHY;   Surgeon: Martinique, Peter M, MD;  Location: [facility];  Service: Cardiovascular;  Laterality: N/A;     Home Medications:  Prior to Admission medications   Medication Sig Start Date End Date Taking? Authorizing Provider  acetaminophen (TYLENOL) 500 MG tablet Take 1,000 mg by mouth every 6 (six) hours as needed for moderate pain or headache.   Yes [provider]  albuterol (VENTOLIN HFA) 108 (90 Base) MCG/ACT inhaler Inhale 2 puffs into the lungs every 6 (six) hours as needed. 11/22/17  Yes [provider]  amLODipine (NORVASC) 10 MG tablet Take 10 mg by mouth daily.   Yes [provider]  aspirin 81 MG tablet Take 81 mg by mouth daily.   Yes [provider]  atorvastatin (LIPITOR) 20 MG tablet Take 20 mg by mouth daily. 08/19/18  Yes [provider]  azaTHIOprine (IMURAN) 50 MG tablet Take 100 mg by mouth daily.  04/13/19  Yes [provider]  benzonatate (TESSALON) 100 MG capsule Take 2 capsules (200 mg total) by mouth 3 (three) times daily as needed for cough. 11/08/20  Yes Brand Males, MD  fluticasone (FLONASE) 50 MCG/ACT nasal spray Place 2 sprays into both nostrils daily. 11/08/20  Yes Brand Males, MD  folic acid (FOLVITE) 456 MCG tablet Take 400 mcg by mouth daily.   Yes [provider]  insulin NPH Human (NOVOLIN N) 100 UNIT/ML injection Inject 12 Units into the skin See admin instructions. Inject 10 units every morning and 20 units every evening   Yes [provider]  levothyroxine (SYNTHROID, LEVOTHROID) 25 MCG tablet Take 25 mcg by mouth daily.   Yes [provider]  metFORMIN (GLUCOPHAGE-XR) 500 MG 24 hr tablet Take 2 tablets (1,000 mg total) by mouth 2 (two) times daily. 05/24/20  Yes Martinique, Peter M, MD  ondansetron (ZOFRAN) 4 MG tablet Take 1 tablet (4 mg total) by mouth 3 (three) times daily as needed for nausea or vomiting. 05/01/20  Yes Brand Males, MD  pantoprazole (PROTONIX) 40 MG  tablet Take 40 mg by mouth daily. 01/14/20  Yes [provider]  predniSONE (DELTASONE) 1 MG tablet Take 3 mg by mouth daily.    Yes [provider]  Tiotropium Bromide Monohydrate (SPIRIVA RESPIMAT) 2.5 MCG/ACT AERS Inhale 2 puffs into the lungs daily. 10/30/20  Yes Brand Males, MD  Insulin Syringe-Needle U-100 (INSULIN SYRINGE .3CC/31GX5/16") 31G X 5/16" 0.3 ML MISC Inject 1 Syringe into the skin as directed. 02/27/20   [provider]    Inpatient Medications: Scheduled Meds: . budesonide (PULMICORT) nebulizer solution  0.25 mg Nebulization BID  . chlorhexidine  15 mL Mouth Rinse BID  . heparin injection (subcutaneous)  5,000 Units Subcutaneous Q8H  . insulin aspart  0-15 Units Subcutaneous TID WC  . ipratropium-albuterol  3 mL Nebulization Q6H  . mouth rinse  15 mL Mouth Rinse q12n4p  . methylPREDNISolone (SOLU-MEDROL) injection  80 mg Intravenous Daily  . pantoprazole (PROTONIX) IV  40 mg Intravenous Q24H   Continuous Infusions: . azithromycin    . cefTRIAXone (ROCEPHIN)  IV    . vancomycin     PRN Meds: hydrALAZINE, metoprolol tartrate  Allergies:    Allergies  Allergen Reactions  . Bactrim [Sulfamethoxazole-Trimethoprim] Shortness Of Breath  Patient states "got short winded"  . Pirfenidone Rash  . Spiriva Respimat [Tiotropium Bromide Monohydrate] Rash  . Ofev [Nintedanib]     Social History:   Social History   Socioeconomic History  . Marital status: Married    Spouse name: Mellody Dance  . Number of children: Not on file  . Years of education: Not on file  . Highest education level: Not on file  Occupational History  . Occupation: retired Oncologist at [employer]  . Smoking status: Former Smoker    Packs/day: 0.50    Years: 10.00    Pack years: 5.00    Types: Cigarettes    Quit date: 12/03/1984    Years since quitting: 35.9  . Smokeless tobacco: Never Used  . Tobacco comment: Quit 40 years ago.   Substance and Sexual  Activity  . Alcohol use: No  . Drug use: No  . Sexual activity: Not Currently  Other Topics Concern  . 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 He reports he is independent with his care needs and transportation to medical appointment. He reports if needed his wife will assist. He reports staying home and likes watching tv.    Social Determinants of Health   Financial Resource Strain: Not on file  Food Insecurity: Not on file  Transportation Needs: Not on file  Physical Activity: Not on file  Stress: Not on file  Social Connections: Not on file  Intimate Partner Violence: Not on file    Family History:    Family History  Problem Relation Age of Onset  . Diabetes Mother   . Stroke Mother      ROS:  Please see the history of present illness.   All other ROS reviewed and negative.     Physical Exam/Data:   Vitals:   11/15/20 0600 11/15/20 0700 11/15/20 0758 11/15/20 0804  BP: 133/75 (!) 142/126    Pulse: 94 97  (!) 109  Resp: (!) 30 (!) 26  (!) 32  Temp:      TempSrc:      SpO2: 92% 93% (!) 67% 96%  Weight:      Height:        Intake/Output Summary (Last 24 hours) at 11/15/2020 0838 Last data filed at 11/15/2020 0700 Gross per 24 hour  Intake 3223.5 ml  Output 460 ml  Net 2763.5 ml   Last 3 Weights 11/14/2020 08/28/2020 05/30/2020  Weight (lbs) 160 lb 174 lb 3.2 oz 172 lb 9.6 oz  Weight (kg) 72.576 kg 79.017 kg 78.291 kg     Body mass index is 25.82 kg/m.  General:  Elderly gentleman sitting upright in bed with BiPAP mask in place HEENT: sclera anicteric Neck: no JVD Vascular: No carotid bruits; distal pulses 2+ bilaterally  Cardiac:  normal S1, S2; RRR; no murmus, rubs, or gallops Lungs:  clear to auscultation bilaterally, no wheezing, rhonchi or rales  Abd: soft, nontender, no hepatomegaly  Ext: no edema Musculoskeletal:  No  deformities, BUE and BLE strength normal and equal Skin: warm and dry  Neuro:  CNs 2-12 intact, no focal abnormalities noted Psych:  Normal affect   EKG:  The EKG was personally reviewed and demonstrates:  Sinus tachycardia with rate 122 bpm, baseline artifact with non-specific T wave abnormalities though no significant STE/D. Telemetry:  Telemetry was personally reviewed  and demonstrates:  Sinus rhythm/sinus tachycardia with occasional PACs/PVCs  Relevant CV Studies: Echocardiogram 06/2020: 1. Left ventricular ejection fraction, by estimation, is 60 to 65%. The  left ventricle has normal function. The left ventricle has no regional  wall motion abnormalities. Left ventricular diastolic parameters are  consistent with Grade I diastolic  dysfunction (impaired relaxation).  2. Right ventricular systolic function is normal. The right ventricular  size is normal. There is severely elevated pulmonary artery systolic  pressure. The estimated right ventricular systolic pressure is 10.6 mmHg.  3. The mitral valve is normal in structure. Trivial mitral valve  regurgitation. No evidence of mitral stenosis.  4. Tricuspid valve regurgitation is moderate.  5. The aortic valve is tricuspid. Aortic valve regurgitation is not  visualized. No aortic stenosis is present.  6. The inferior vena cava is normal in size with greater than 50%  respiratory variability, suggesting right atrial pressure of 3 mmHg.  LHC 05/2020:  Prox LAD to Mid LAD lesion is 20% stenosed.  The left ventricular systolic function is normal.  LV end diastolic pressure is normal.  The left ventricular ejection fraction is 55-65% by visual estimate.  Hemodynamic findings consistent with mild pulmonary hypertension.  LV end diastolic pressure is normal.   1. Mild nonobstructive CAD 2. Normal LV function 3. Normal LVEDP 5 mm Hg 4. Upper normal PA pressure with mean 25 mm Hg 5. Normal cardiac output.  Plan: medical  management.    Laboratory Data:  High Sensitivity Troponin:  No results for input(s): TROPONINIHS in the last 720 hours.   Chemistry Recent Labs  Lab 11/14/20 1233 11/15/20 0605  NA 139  --   K 4.3  --   CL 101  --   CO2 25  --   GLUCOSE 171*  --   BUN 15  --   CREATININE 0.95 0.89  CALCIUM 8.8*  --   GFRNONAA >60 >60  ANIONGAP 13  --     Recent Labs  Lab 11/14/20 1233  PROT 7.4  ALBUMIN 3.6  AST 25  ALT 14  ALKPHOS 36*  BILITOT 1.0   Hematology Recent Labs  Lab 11/14/20 1233  WBC 8.4  RBC 3.85*  HGB 12.3*  HCT 40.7  MCV 105.7*  MCH 31.9  MCHC 30.2  RDW 16.8*  PLT 240   BNP Recent Labs  Lab 11/14/20 1235  BNP 98.0    DDimer No results for input(s): DDIMER in the last 168 hours.   Radiology/Studies:  DG Chest Port 1 View  Result Date: 11/14/2020 CLINICAL DATA:  Shortness of breath, COPD, pulmonary fibrosis, pending COVID-19 test EXAM: PORTABLE CHEST 1 VIEW COMPARISON:  Portable exam 1243 hours compared to 02/17/2020 FINDINGS: Normal heart size, mediastinal contours, and pulmonary vascularity. Extensive chronic interstitial changes throughout both lungs, least severe in the LEFT upper lobe. Additional new infiltrates in the mid to lower lungs bilaterally favoring superimposed acute pneumonia, less likely aspiration. No pleural effusion or pneumothorax. Mild elevation of RIGHT diaphragm. Bones demineralized. IMPRESSION: Pulmonary fibrosis with superimposed acute infiltrates in the mid to lower lungs favor pneumonia, including COVID-19, less likely aspiration. Electronically Signed   By: Lavonia Dana [name]   On: 11/14/2020 13:04     Assessment and Plan:   1. Acute on chronic respiratory failure in patient with known pulmonary fibrosis and mild pulmonary hypertension: patient presented with profound hypoxia with O2 sat of 48% on EMS arrival in the setting of being out of his home O2. Quickly improved  with BiPAP, however attempts to wean to HFNC have been  unsuccessful with O2 sat drops to 60s. BNP was 98 and no volume overload on exam. He had evidence of superimposed infection on CXR. He has been on IV antibiotics and steroids. In review of his prior pulmHTN work up, there appears to be a significant discrepancy between PA pressures on [place] 05/2020 and echo 06/2020 with RHC reading high normal PA pressures at 51mHg and echo suggesting severely elevated pressures at 852mg. PCCM has consulted palliative care team [place]. Ddimer elevated to 3.0 on labs this morning - Will update an echocardiogram at this time.  - Unclear what benefit Treprostinil may have though would be reasonable to pursue a RHC to re-evaluate his PA pressures. Will reassess readiness over the weekend to determine timing of RHC.  - CTA chest PE study pending to evaluate elevated DDimer - Continue IV antibiotics and supportive care per PCCM - Agree with palliative care consult  2. HTN: BP intermittently elevated though generally improved over the past 12 hours without use of prn metoprolol/hydralazine. Home amlodipine on hold.  - Favor ongoing monitoring and use of prn medications as ordered for now  3. Non-obstructive CAD: mild LAD disease noted [place] [facility] [place]. No complaints of chest pain. EKG non-ischemic. Home aspirin and statin on hold given NPO status with ongoing BiPAP use. - Can restart home medications when tolerating po  4. Pulmonary fibrosis: follows outpatient with Dr. RaChase Callerith progressive disease c/b medication intolerances. Overall poor prognosis. Palliative care consulted this admission - Continue management per primary team  5. DM type 2: A1C 7.1 this admission - Continue ISS per primary team     For questions or updates, please contact [place] [place] consult [email or web address] for contact info under    Signed, KrAbigail ButtsPA-C  11/15/2020 8:38 AM   Patient seen and examined with the above-signed Advanced Practice Provider and/or  Housestaff. I personally reviewed laboratory data, imaging studies and relevant notes. I independently examined the patient and formulated the important aspects of the plan. I have edited the note to reflect any of my changes or salient points. I have personally discussed the plan with the patient and/or family.  [age]/o male with end-stage pulmonary fibrosis admitted with acute on chronic respiratory failure. Found to have severe multi-lobar PNA on CT (on top of IPF). We are asked by CCM to see for possible RHC in consideration of possible inhaled treprostinil. In review of his prior pulmHTN work up, there appears to be a significant discrepancy between PA pressures on RHC 05/2020 (essentially normal) and echo suggesting severely elevated pressures at 8115m.   At baseline likely NYHA IIIB. Currently breathing feels better on BIPAP. Also getting steroids and abx.   General:  Sitting up in bed on BIPAP HEENT: normal Neck: supple.JVP to jaw  Carotids 2+ bilat; no bruits. No lymphadenopathy or thryomegaly appreciated. Cor: PMI nondisplaced. Tachy regular 2/6 TR Lungs: diffuse crackles Abdomen: soft, nontender, nondistended. No hepatosplenomegaly. No bruits or masses. Good bowel sounds. Extremities: no cyanosis, clubbing, rash, trace edema Neuro: alert & orientedx3, cranial nerves grossly intact. moves all 4 extremities w/o difficulty. Affect pleasant  Current decompensation likely due to multilobar PNA on top of severe IPF. Agree with current treatment plan. I will see him again early next week and if symptomatically improved will plan repeat RHC to try and sort out discrepancy between previous RHC and echo. D/w Dr. EllLoanne Drilling CCM[place]roll[place] DanGlori Bickers  MD  6:44 PM

## 2020-11-15 NOTE — H&P (Signed)
Please see "Progress Note" by Dr. Melvyn Novas on 11/14/20. Note mislabeled and is H&P for Critical Care Service.  Rodman Pickle, [name] [facility] Pulmonary/Critical Care Medicine 11/15/2020 12:16 PM

## 2020-11-15 NOTE — Progress Notes (Signed)
PHARMACY - PHYSICIAN COMMUNICATION CRITICAL VALUE ALERT - BLOOD CULTURE IDENTIFICATION (BCID)  [name] is an 75 y.o. male who presented to [facility] on 11/14/2020 with a chief complaint of acute on chronic respiratory failure. Blood cultures 1/2 with Staph spp. Only.   Assessment:  Bcx 1/2 Staph spp (only 1 set drawn) - likely contamination  Name of physician (or Provider) Contacted: Dr. Albertina Parr - PharmD  Current antibiotics: Vancomycin, ceftriaxone, azithromycin  Changes to prescribed antibiotics recommended:  Discontinue vancomycin   Results for orders placed or performed during the hospital encounter of 11/14/20  Blood Culture ID Panel (Reflexed) (Collected: 11/14/2020 12:33 PM)  Result Value Ref Range   Enterococcus faecalis NOT DETECTED NOT DETECTED   Enterococcus Faecium NOT DETECTED NOT DETECTED   Listeria monocytogenes NOT DETECTED NOT DETECTED   Staphylococcus species DETECTED (A) NOT DETECTED   Staphylococcus aureus (BCID) NOT DETECTED NOT DETECTED   Staphylococcus epidermidis NOT DETECTED NOT DETECTED   Staphylococcus lugdunensis NOT DETECTED NOT DETECTED   Streptococcus species NOT DETECTED NOT DETECTED   Streptococcus agalactiae NOT DETECTED NOT DETECTED   Streptococcus pneumoniae NOT DETECTED NOT DETECTED   Streptococcus pyogenes NOT DETECTED NOT DETECTED   A.calcoaceticus-baumannii NOT DETECTED NOT DETECTED   Bacteroides fragilis NOT DETECTED NOT DETECTED   Enterobacterales NOT DETECTED NOT DETECTED   Enterobacter cloacae complex NOT DETECTED NOT DETECTED   Escherichia coli NOT DETECTED NOT DETECTED   Klebsiella aerogenes NOT DETECTED NOT DETECTED   Klebsiella oxytoca NOT DETECTED NOT DETECTED   Klebsiella pneumoniae NOT DETECTED NOT DETECTED   Proteus species NOT DETECTED NOT DETECTED   Salmonella species NOT DETECTED NOT DETECTED   Serratia marcescens NOT DETECTED NOT DETECTED   Haemophilus influenzae NOT DETECTED NOT DETECTED   Neisseria meningitidis  NOT DETECTED NOT DETECTED   Pseudomonas aeruginosa NOT DETECTED NOT DETECTED   Stenotrophomonas maltophilia NOT DETECTED NOT DETECTED   Candida albicans NOT DETECTED NOT DETECTED   Candida auris NOT DETECTED NOT DETECTED   Candida glabrata NOT DETECTED NOT DETECTED   Candida krusei NOT DETECTED NOT DETECTED   Candida parapsilosis NOT DETECTED NOT DETECTED   Candida tropicalis NOT DETECTED NOT DETECTED   Cryptococcus neoformans/gattii NOT DETECTED NOT DETECTED    Esmond Plants 11/15/2020  2:44 PM

## 2020-11-15 NOTE — Progress Notes (Signed)
Pharmacy Antibiotic Note  [name] is a 75 y.o. male admitted on 11/14/2020 with pneumonia. Patient was switched from vancomycin to ceftriaxone and azithromycin but blood cultures subsequently grew 1/2 GPC. BCID should be back soon. Per MD, plan is to resume IV vancomycin empirically.   Plan: Vancomycin 1500 mg IV every 24 hours. Expected AUC 497. Monitor labs, c/s, and de-escalation.  Height: _0  (167.6 cm) Weight: 72.6 kg (160 lb) IBW/kg (Calculated) : 63.8  Temp (24hrs), Avg:98.1 F (36.7 C), Min:97.6 F (36.4 C), Max:99.1 F (37.3 C)  Recent Labs  Lab 11/14/20 1233 11/14/20 1325 11/14/20 1527 11/15/20 0605  WBC 8.4  --   --   --   CREATININE 0.95  --   --  0.89  LATICACIDVEN  --  3.7* 5.3*  --     Estimated Creatinine Clearance: 64.7 mL/min (by C-G formula based on SCr of 0.89 mg/dL).    Allergies  Allergen Reactions  . Bactrim [Sulfamethoxazole-Trimethoprim] Shortness Of Breath    Patient states "got short winded"  . Pirfenidone Rash  . Spiriva Respimat [Tiotropium Bromide Monohydrate] Rash  . Ofev [Nintedanib]     Antimicrobials this admission: Vanco 1/13 >>  CTX/Azith 1/13 >>   Microbiology results: 1/13 BCx: 1/2 GPC  1/13 UCx: pending  1/13 MRSA PCR: pending  Thank you for allowing pharmacy to be a part of this patient's care.  Albertina Parr, PharmD., BCPS, BCCCP Clinical Pharmacist Please refer to [facility] for unit-specific pharmacist

## 2020-11-15 NOTE — Progress Notes (Signed)
NAME:  [name], MRN:  [id number], DOB:  03/01/1946, LOS: 1 ADMISSION DATE:  11/14/2020, CONSULTATION DATE:  1/13 REFERRING MD:  EDP/ Zammit, CHIEF COMPLAINT:  resp distress   Brief History:  36 yobm remote smoker > 6 m since [place] with RA/ PF 02 dep at baseline  f/b Ramaswamy/ Beekman brought in by ambulance with sats intially 48% on 4lpm though improved on bipap and PCCM services asked to consult pm 1/13    History of Present Illness:  Unable to obtain hx due to resp extremis - per Ramaswamy's office note he is very stoic and typically does not give reliable symptom hx even when seen in office but overall pattern has been one of progressive dz and intolerance to multiple PF /copd meds   Past Medical History:  Arthritis RA with ILD   COPD on list  though most of pfts are restrictive with no curvature on f/v loop Diabetes mellitus Hypertension Pneumonia Thyroid disease.    Significant Hospital Events:   Placed on bipap in ER initially  Consults:  PCCM  Procedures:    Significant Diagnostic Tests:    Micro Data:  Resp viral panel/ PCR  1/13 for covid / flu both neg MRSA  PCR  1/13>>> BC x 1  1/13  >>>    Antimicrobials:  Vancomycin 1/13> Rocephin 1/13> Zmax 1/13>  Interim History / Subjective:  Reports improved dyspnea. Tolerating BiPAP. Speaking full sentences  Objective   Blood pressure (!) 142/126, pulse 97, temperature 97.9 F (36.6 C), temperature source Oral, resp. rate (!) 26, height _0  (1.676 m), weight 72.6 kg, SpO2 93 %.    FiO2 (%):  [50 %-80 %] 50 %   Intake/Output Summary (Last 24 hours) at 11/15/2020 0755 Last data filed at 11/15/2020 0700 Gross per 24 hour  Intake 3223.5 ml  Output 460 ml  Net 2763.5 ml   Filed Weights   11/14/20 1229  Weight: 72.6 kg    Physical Exam: General: Well-appearing, no acute distress HENT: [place], AT, wearing BiPAP Eyes: EOMI, no scleral icterus Respiratory: Anterior rhonchi, no wheezing Cardiovascular:  RRR, -M/R/G, no JVD GI: BS+, soft, nontender Extremities:-Edema,-tenderness Neuro: AAO x4, CNII-XII grossly intact  Resolved Hospital Problem list      Assessment & Plan:  Acute on chronic resp failure with hypercabia and hypoxemia, usually a very bad sign in pt with PF - unable to de-escalate to heated high flow due to severe desaturations - Obtain D-dimer. If positive will obtain CTA to rule out PE. Though I suspect his fibrosis +/- probable pulmonary hypertension is mainly contributing to his symptoms - DC Vanc. Continue CAP coverage - Solumedrol 80 mg IV q 12 h - Cardiology consult for consideration of RHC prior to initiation of Treprostinil though may be of marginal benefit based on available studies - Will consult Palliative given poor prognosis - Scheduled Pulmicort and Duonebs - DC IVF. Hold diuresis for now as he is euvolemic   HTN - PRN hydralazine  AODM - SSI  Best practice (evaluated daily)  Diet: npo for now  Pain/Anxiety/Delirium protocol (if indicated): n/a VAP protocol (if indicated): n/a DVT prophylaxis: sq hep GI prophylaxis: ppi  Glucose control: n/a  Mobility: bed rest for now  Disposition: Remain in ICU  Goals of Care:  Last date of multidisciplinary goals of care discussion: Family and staff present:  Summary of discussion:  Follow up goals of care discussion due:  Code Status: FULL code, avoid et if possible though does  not appear endstage to me  Labs   CBC: Recent Labs  Lab 11/14/20 1233  WBC 8.4  NEUTROABS 6.7  HGB 12.3*  HCT 40.7  MCV 105.7*  PLT 382    Basic Metabolic Panel: Recent Labs  Lab 11/14/20 1233  NA 139  K 4.3  CL 101  CO2 25  GLUCOSE 171*  BUN 15  CREATININE 0.95  CALCIUM 8.8*   GFR: Estimated Creatinine Clearance: 60.6 mL/min (by C-G formula based on SCr of 0.95 mg/dL). Recent Labs  Lab 11/14/20 1233 11/14/20 1325 11/14/20 1527 11/14/20 1621  PROCALCITON  --   --   --  0.41  WBC 8.4  --   --   --    LATICACIDVEN  --  3.7* 5.3*  --     Liver Function Tests: Recent Labs  Lab 11/14/20 1233  AST 25  ALT 14  ALKPHOS 36*  BILITOT 1.0  PROT 7.4  ALBUMIN 3.6   No results for input(s): LIPASE, AMYLASE in the last 168 hours. No results for input(s): AMMONIA in the last 168 hours.  ABG    Component Value Date/Time   PHART 7.306 (L) 11/14/2020 1436   PCO2ART 47.6 11/14/2020 1436   PO2ART 77.3 (L) 11/14/2020 1436   HCO3 21.9 11/14/2020 1436   TCO2 31 05/21/2020 1034   ACIDBASEDEF 2.3 (H) 11/14/2020 1436   O2SAT 89.8 11/14/2020 1436     Coagulation Profile: Recent Labs  Lab 11/14/20 1233  INR 1.1    Cardiac Enzymes: No results for input(s): CKTOTAL, CKMB, CKMBINDEX, TROPONINI in the last 168 hours.  HbA1C: Hgb A1c MFr Bld  Date/Time Value Ref Range Status  11/14/2020 04:21 PM 7.1 (H) 4.8 - 5.6 % Final    Comment:    (NOTE) Pre diabetes:          5.7%-6.4%  Diabetes:              >6.4%  Glycemic control for   <7.0% adults with diabetes   12/15/2011 06:55 PM 7.8 (H) <5.7 % Final    Comment:    (NOTE)                                                                       According to the ADA Clinical Practice Recommendations for 2011, when HbA1c is used as a screening test:  >=6.5%   Diagnostic of Diabetes Mellitus           (if abnormal result is confirmed) 5.7-6.4%   Increased risk of developing Diabetes Mellitus References:Diagnosis and Classification of Diabetes Mellitus,Diabetes NKNL,9767,34(LPFXT 1):S62-S69 and Standards of Medical Care in         Diabetes - 2011,Diabetes [id number],9735,32 (Suppl 1):S11-S61.    CBG: Recent Labs  Lab 11/14/20 2152 11/15/20 0726  GLUCAP 254* 218*        The patient is critically ill with multiple organ systems failure and requires high complexity decision making for assessment and support, frequent evaluation and titration of therapies, application of advanced monitoring technologies and extensive interpretation of  multiple databases.  Independent Critical Care Time: 40 Minutes.   Rodman Pickle, [name] [facility] Pulmonary/Critical Care Medicine 11/15/2020 7:55 AM   Please see Amion for pager number to reach on-call Pulmonary and  Critical Care Team.

## 2020-11-16 DIAGNOSIS — J849 Interstitial pulmonary disease, unspecified: Secondary | ICD-10-CM | POA: Diagnosis not present

## 2020-11-16 DIAGNOSIS — Z515 Encounter for palliative care: Secondary | ICD-10-CM

## 2020-11-16 DIAGNOSIS — J189 Pneumonia, unspecified organism: Principal | ICD-10-CM

## 2020-11-16 DIAGNOSIS — J8410 Pulmonary fibrosis, unspecified: Secondary | ICD-10-CM

## 2020-11-16 DIAGNOSIS — J9621 Acute and chronic respiratory failure with hypoxia: Secondary | ICD-10-CM | POA: Diagnosis not present

## 2020-11-16 LAB — GLUCOSE, CAPILLARY
Glucose-Capillary: 165 mg/dL — ABNORMAL HIGH (ref 70–99)
Glucose-Capillary: 144 mg/dL — ABNORMAL HIGH (ref 70–99)
Glucose-Capillary: 158 mg/dL — ABNORMAL HIGH (ref 70–99)
Glucose-Capillary: 193 mg/dL — ABNORMAL HIGH (ref 70–99)

## 2020-11-16 LAB — CULTURE, BLOOD (SINGLE): Special Requests: ADEQUATE

## 2020-11-16 LAB — CBC
HCT: 35.3 % — ABNORMAL LOW (ref 39.0–52.0)
Hemoglobin: 10.7 g/dL — ABNORMAL LOW (ref 13.0–17.0)
MCH: 31.3 pg (ref 26.0–34.0)
MCHC: 30.3 g/dL (ref 30.0–36.0)
MCV: 103.2 fL — ABNORMAL HIGH (ref 80.0–100.0)
Platelets: 210 10*3/uL (ref 150–400)
RBC: 3.42 MIL/uL — ABNORMAL LOW (ref 4.22–5.81)
RDW: 17.3 % — ABNORMAL HIGH (ref 11.5–15.5)
WBC: 12.5 10*3/uL — ABNORMAL HIGH (ref 4.0–10.5)
nRBC: 0.3 % — ABNORMAL HIGH (ref 0.0–0.2)

## 2020-11-16 LAB — BASIC METABOLIC PANEL
Anion gap: 11 (ref 5–15)
BUN: 17 mg/dL (ref 8–23)
CO2: 26 mmol/L (ref 22–32)
Calcium: 8.5 mg/dL — ABNORMAL LOW (ref 8.9–10.3)
Chloride: 110 mmol/L (ref 98–111)
Creatinine, Ser: 0.89 mg/dL (ref 0.61–1.24)
GFR, Estimated: >60 mL/min (ref >60)
Glucose, Bld: 186 mg/dL — ABNORMAL HIGH (ref 70–99)
Potassium: 4.6 mmol/L (ref 3.5–5.1)
Sodium: 147 mmol/L — ABNORMAL HIGH (ref 135–145)

## 2020-11-16 LAB — URINE CULTURE: Culture: NO GROWTH

## 2020-11-16 LAB — PROCALCITONIN: Procalcitonin: 0.58 ng/mL

## 2020-11-16 MED ORDER — MORPHINE SULFATE (PF) 2 MG/ML IV SOLN: 1.0000 mg | INTRAVENOUS | Status: DC | PRN

## 2020-11-16 NOTE — Progress Notes (Signed)
NAME:  [name], MRN:  [id number], DOB:  1946/07/13, LOS: 2 ADMISSION DATE:  11/14/2020, CONSULTATION DATE:  1/13 REFERRING MD:  EDP/ Zammit, CHIEF COMPLAINT:  resp distress   Brief History:  37 yobm remote smoker > 6 m since [place] with RA/ PF 02 dep at baseline  f/b Ramaswamy/ Beekman brought in by ambulance with sats intially 48% on 4lpm though improved on bipap and PCCM services asked to consult pm 1/13    History of Present Illness:  Unable to obtain hx due to resp extremis - per Ramaswamy's office note he is very stoic and typically does not give reliable symptom hx even when seen in office but overall pattern has been one of progressive dz and intolerance to multiple PF /copd meds   Past Medical History:  Arthritis RA with ILD   COPD on list  though most of pfts are restrictive with no curvature on f/v loop Diabetes mellitus Hypertension Pneumonia Thyroid disease.    Significant Hospital Events:   Placed on bipap in ER initially  Consults:  PCCM  Procedures:    Significant Diagnostic Tests:  CTA 11/16/19 Bilateral patchy ground galss opacities/consolidation superimposed on chronic ILD. Honeycombing.   Micro Data:  Resp viral panel/ PCR  1/13 for covid / flu both neg MRSA  PCR  1/13>>> BC x 1  1/13  >>>    Antimicrobials:  Vancomycin 1/13>1/14 Rocephin 1/13> Zmax 1/13>  Interim History / Subjective:  Appears comfortable on BiPAP. FIO2 50%. Reports thirst.   Objective   Blood pressure (!) 141/75, pulse 96, temperature (!) 96.6 F (35.9 C), temperature source Axillary, resp. rate (!) 25, height _0  (1.676 m), weight 72.6 kg, SpO2 98 %.    FiO2 (%):  [50 %-100 %] 50 %   Intake/Output Summary (Last 24 hours) at 11/16/2020 0849 Last data filed at 11/16/2020 0800 Gross per 24 hour  Intake 360 ml  Output 1230 ml  Net -870 ml   Filed Weights   11/14/20 1229  Weight: 72.6 kg   Physical Exam: General: Well-appearing, no acute distress HENT: [place], AT,  wearing BiPAP Eyes: EOMI, no scleral icterus Respiratory: Diminished breath sounds bilaterally. Improved rhonchi No crackles, wheezing or rales Cardiovascular: RRR, -M/R/G, no JVD GI: BS+, soft, nontender Extremities:-Edema,-tenderness Neuro: AAO x4, CNII-XII grossly intact   Resolved Hospital Problem list      Assessment & Plan:  Acute on chronic resp failure with hypercabia and hypoxemia secondary CAP ILD exacerbation ?Pulmonary hypertension contributing - CTA neg for PE. Findings concerning for pneumonia/ILD exacerbation - Transition to heated high flow when able. BiPAP as needed - Continue CAP coverage - Solumedrol 80 mg IV q 12 h - Cardiology consult for consideration of RHC prior to initiation of Treprostinil though may be of marginal benefit based on available studies - Will consult Palliative given poor prognosis - Scheduled Pulmicort and Duonebs  HTN - PRN hydralazine  AODM - SSI  Best practice (evaluated daily)  Diet: npo for now  Pain/Anxiety/Delirium protocol (if indicated): n/a VAP protocol (if indicated): n/a DVT prophylaxis: sq hep GI prophylaxis: ppi  Glucose control: n/a  Mobility: bed rest for now  Disposition: Remain in ICU  Goals of Care:  Last date of multidisciplinary goals of care discussion: Family and staff present:  Summary of discussion:  Follow up goals of care discussion due:  Code Status: FULL code, avoid et if possible though does not appear endstage to me  Labs   CBC: Recent Labs  Lab 11/14/20 1233 11/16/20 0100  WBC 8.4 12.5*  NEUTROABS 6.7  --   HGB 12.3* 10.7*  HCT 40.7 35.3*  MCV 105.7* 103.2*  PLT 240 397    Basic Metabolic Panel: Recent Labs  Lab 11/14/20 1233 11/15/20 0605 11/16/20 0100  NA 139  --  147*  K 4.3  --  4.6  CL 101  --  110  CO2 25  --  26  GLUCOSE 171*  --  186*  BUN 15  --  17  CREATININE 0.95 0.89 0.89  CALCIUM 8.8*  --  8.5*   GFR: Estimated Creatinine Clearance: 64.7 mL/min (by C-G  formula based on SCr of 0.89 mg/dL). Recent Labs  Lab 11/14/20 1233 11/14/20 1325 11/14/20 1527 11/14/20 1621 11/15/20 0605 11/16/20 0100  PROCALCITON  --   --   --  0.41 0.81 0.58  WBC 8.4  --   --   --   --  12.5*  LATICACIDVEN  --  3.7* 5.3*  --   --   --     Liver Function Tests: Recent Labs  Lab 11/14/20 1233  AST 25  ALT 14  ALKPHOS 36*  BILITOT 1.0  PROT 7.4  ALBUMIN 3.6   No results for input(s): LIPASE, AMYLASE in the last 168 hours. No results for input(s): AMMONIA in the last 168 hours.  ABG    Component Value Date/Time   PHART 7.306 (L) 11/14/2020 1436   PCO2ART 47.6 11/14/2020 1436   PO2ART 77.3 (L) 11/14/2020 1436   HCO3 21.9 11/14/2020 1436   TCO2 31 05/21/2020 1034   ACIDBASEDEF 2.3 (H) 11/14/2020 1436   O2SAT 89.8 11/14/2020 1436     Coagulation Profile: Recent Labs  Lab 11/14/20 1233  INR 1.1    Cardiac Enzymes: No results for input(s): CKTOTAL, CKMB, CKMBINDEX, TROPONINI in the last 168 hours.  HbA1C: Hgb A1c MFr Bld  Date/Time Value Ref Range Status  11/14/2020 04:21 PM 7.1 (H) 4.8 - 5.6 % Final    Comment:    (NOTE) Pre diabetes:          5.7%-6.4%  Diabetes:              >6.4%  Glycemic control for   <7.0% adults with diabetes   12/15/2011 06:55 PM 7.8 (H) <5.7 % Final    Comment:    (NOTE)                                                                       According to the ADA Clinical Practice Recommendations for 2011, when HbA1c is used as a screening test:  >=6.5%   Diagnostic of Diabetes Mellitus           (if abnormal result is confirmed) 5.7-6.4%   Increased risk of developing Diabetes Mellitus References:Diagnosis and Classification of Diabetes Mellitus,Diabetes QBHA,1937,90(WIOXB 1):S62-S69 and Standards of Medical Care in         Diabetes - 2011,Diabetes DZHG,9924,26 (Suppl 1):S11-S61.    CBG: Recent Labs  Lab 11/15/20 0726 11/15/20 1119 11/15/20 1607 11/15/20 2157 11/16/20 0715  GLUCAP 218* 203*  134* 150* 165*        The patient is critically ill with multiple organ systems failure and requires high  complexity decision making for assessment and support, frequent evaluation and titration of therapies, application of advanced monitoring technologies and extensive interpretation of multiple databases.  Independent Critical Care Time: 35 Minutes.   Rodman Pickle, [name] [facility] Pulmonary/Critical Care Medicine 11/16/2020 8:49 AM   Please see Amion for pager number to reach on-call Pulmonary and Critical Care Team.

## 2020-11-16 NOTE — Consult Note (Addendum)
Consultation Note Date: 11/16/2020   Patient Name: [name]  DOB: Jan 03, 1946  MRN: [id number]  Age / Sex: 75 y.o., male  PCP: Steven Seashore, MD Referring Physician: Margaretha Seeds, MD  Reason for Consultation: Establishing goals of care  HPI/Patient Profile: 75 y.o. male  with past medical history of DM, RA, thyroid disease, COPD and interstitial lung disease (followed by [name]) who was admitted on 11/14/2020 with hypoxic respiratory failure.  He had run out of oxygen at home, and EMS reported his oxygen sat on 4L was 48%.  Imaging on admission showed multifocal pneumonia superimposed on ILD.  CT scan done 1/14 shows extensive honeycombing with multifocal pneumonia.    Clinical Assessment and Goals of Care:  I have reviewed medical records including EPIC notes, labs and imaging, received report from the ICU RN,  examined the patient and spoke with him  to discuss diagnosis prognosis, GOC, EOL wishes, disposition and options. After our conversation I called his wife [name].  I introduced Palliative Medicine as specialized medical care for people living with serious illness. It focuses on providing relief from the symptoms and stress of a serious illness.  [name] is quite short of breath while we are talking O2 sats are in the 36s and 80s on HFNC.  He has received a tray of clear liquids and quickly consumes it all as we talk.  Our conversation was brief as it appeared to be a significant effort for [name] to speak with me.    We discussed a brief life review of the patient.  He tells me he had a career at [employer] and we commented on the changes in the industry over the past 30 years.    I asked him about a decision making surrogate if he was unable to make his own medical decisions.  He would want his wife [name] to make his medical decisions for him.  After he finished his meal, I asked him  about code status.  If his heart stopped and he stopped breathing, would he want [place] to attempt to aggressively resuscitate him? He thought for a moment and responded "one time".  He went on to state that he would never want long term life support, tracheostomy or a PEG tube. I thanked [name] and committed to follow up with him again soon.  I called [name].  She is familiar with [name] ILD and that it will one day be the cause of his death.  She explains that [name] tells them when things are getting bad.  I relayed my conversation with [name] to [name].  She thanked me for the information as she said they had never discussed his wishes for life support and she was glad to know them.   I encouraged her to have a conversation directly with her husband about the subject.     I explained to [name] that if [name] was ever put on the ventilator it would be highly likely that he  would never be able to come off.  She felt that he would not want that.  She expressed that she does not want to see him suffer and that it is difficult for her to be at bedside when he is working so hard to breathe.  She thanked me for the phone call and we agreed to speak again soon.   Questions and concerns were addressed.  The family was encouraged to call with questions or concerns.        Primary Decision Maker:  PATIENT    SUMMARY OF RECOMMENDATIONS    Patient needs to understand whether or not time on the ventilator would be of benefit to him. If time on the ventilator would not be of benefit to him I believe he would change his code status to DNR.  Today his wishes are:  Full code, no long term life support, No tracheostomy, No Peg.  Wife is decision making surrogate.  She does not want him to suffer.  If patient pursues tier 4 medications these will not be covered by Hospice.  Consequently I would recommend he be followed by Palliative Care outpatient.  PMT inpatient  will continue to follow with you.  Code Status/Advance Care Planning:  Full code.   Symptom Management:   Added 1 mg morphine q 4 hours PRN to ease WOB.  Additional Recommendations (Limitations, Scope, Preferences):  Full Scope Treatment  Palliative Prophylaxis:   Frequent Pain Assessment  Psycho-social/Spiritual:   Desire for further Chaplaincy support: not discussed.  Prognosis:  Concerning for less than 6 months.    Discharge Planning: To Be Determined      Primary Diagnoses: Present on Admission: . Acute on chronic respiratory failure ([place]) . HTN (hypertension) . Respiratory distress   I have reviewed the medical record, interviewed the patient and family, and examined the patient. The following aspects are pertinent.  Past Medical History:  Diagnosis Date  . Arthritis   . COPD (chronic obstructive pulmonary disease) ([place])   . Diabetes mellitus   . Hypertension   . Pneumonia   . Thyroid disease    Social History   Socioeconomic History  . Marital status: Married    Spouse name: [name]  . Number of children: Not on file  . Years of education: Not on file  . Highest education level: Not on file  Occupational History  . Occupation: retired Oncologist at [employer]  . Smoking status: Former Smoker    Packs/day: 0.50    Years: 10.00    Pack years: 5.00    Types: Cigarettes    Quit date: 12/03/1984    Years since quitting: 35.9  . Smokeless tobacco: Never Used  . Tobacco comment: Quit 40 years ago.   Substance and Sexual Activity  . Alcohol use: No  . Drug use: No  . Sexual activity: Not Currently  Other Topics Concern  . 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 He reports he is independent with his care needs and transportation to medical appointment. He reports if needed his wife will assist. He reports staying  home and likes watching tv.    Social Determinants of Health   Financial Resource Strain: Not on file  Food Insecurity: Not on file  Transportation Needs: Not on file  Physical Activity: Not on file  Stress: Not  on file  Social Connections: Not on file   Family History  Problem Relation Age of Onset  . Diabetes Mother   . Stroke Mother     Allergies  Allergen Reactions  . Bactrim [Sulfamethoxazole-Trimethoprim] Shortness Of Breath    Patient states "got short winded"  . Pirfenidone Rash  . Spiriva Respimat [Tiotropium Bromide Monohydrate] Rash  . Ofev [Nintedanib]       Vital Signs: BP (!) 147/78   Pulse (!) 109   Temp 98 F (36.7 C) (Oral)   Resp (!) 29   Ht _0  (1.676 m)   Wt 72.6 kg   SpO2 90%   BMI 25.82 kg/m  Pain Scale: 0-10   Pain Score: 0-No pain   SpO2: SpO2: 90 % O2 Device:SpO2: 90 % O2 Flow Rate: .O2 Flow Rate (L/min): 25 L/min    Palliative Assessment/Data: 30%     Time In: 5:00 Time Out: 6:00 Time Total: 60 min Visit consisted of counseling and education dealing with the complex and emotionally intense issues surrounding the need for palliative care and symptom management in the setting of serious and potentially life-threatening illness. Greater than 50%  of this time was spent counseling and coordinating care related to the above assessment and plan.  Signed by: Florentina Jenny, PA-C Palliative Medicine  Please contact Palliative Medicine Team phone at [phone] for questions and concerns.  For individual provider: See Shea Evans

## 2020-11-17 DIAGNOSIS — I270 Primary pulmonary hypertension: Secondary | ICD-10-CM

## 2020-11-17 DIAGNOSIS — J849 Interstitial pulmonary disease, unspecified: Secondary | ICD-10-CM | POA: Diagnosis not present

## 2020-11-17 DIAGNOSIS — J9621 Acute and chronic respiratory failure with hypoxia: Secondary | ICD-10-CM | POA: Diagnosis not present

## 2020-11-17 LAB — CBC
HCT: 34 % — ABNORMAL LOW (ref 39.0–52.0)
Hemoglobin: 11 g/dL — ABNORMAL LOW (ref 13.0–17.0)
MCH: 32.1 pg (ref 26.0–34.0)
MCHC: 32.4 g/dL (ref 30.0–36.0)
MCV: 99.1 fL (ref 80.0–100.0)
Platelets: 227 10*3/uL (ref 150–400)
RBC: 3.43 MIL/uL — ABNORMAL LOW (ref 4.22–5.81)
RDW: 17.4 % — ABNORMAL HIGH (ref 11.5–15.5)
WBC: 8.2 10*3/uL (ref 4.0–10.5)
nRBC: 0.7 % — ABNORMAL HIGH (ref 0.0–0.2)

## 2020-11-17 LAB — BASIC METABOLIC PANEL
Anion gap: 12 (ref 5–15)
BUN: 17 mg/dL (ref 8–23)
CO2: 26 mmol/L (ref 22–32)
Calcium: 8.4 mg/dL — ABNORMAL LOW (ref 8.9–10.3)
Chloride: 107 mmol/L (ref 98–111)
Creatinine, Ser: 0.85 mg/dL (ref 0.61–1.24)
GFR, Estimated: >60 mL/min (ref >60)
Glucose, Bld: 210 mg/dL — ABNORMAL HIGH (ref 70–99)
Potassium: 3.7 mmol/L (ref 3.5–5.1)
Sodium: 145 mmol/L (ref 135–145)

## 2020-11-17 LAB — GLUCOSE, CAPILLARY
Glucose-Capillary: 178 mg/dL — ABNORMAL HIGH (ref 70–99)
Glucose-Capillary: 205 mg/dL — ABNORMAL HIGH (ref 70–99)
Glucose-Capillary: 144 mg/dL — ABNORMAL HIGH (ref 70–99)
Glucose-Capillary: 265 mg/dL — ABNORMAL HIGH (ref 70–99)

## 2020-11-17 MED ORDER — AMLODIPINE BESYLATE 10 MG PO TABS
10.0000 mg | ORAL_TABLET | Freq: Every day | ORAL | Status: DC
  Administered 2020-11-17 – 2020-11-22 (×6): 10 mg via ORAL
  Filled 2020-11-17 (×6): qty 1

## 2020-11-17 MED ORDER — FUROSEMIDE 10 MG/ML IJ SOLN
40.0000 mg | Freq: Once | INTRAMUSCULAR | Status: AC
  Administered 2020-11-17: 40 mg via INTRAVENOUS
  Filled 2020-11-17: qty 4

## 2020-11-17 MED ORDER — ASPIRIN EC 81 MG PO TBEC
81.0000 mg | DELAYED_RELEASE_TABLET | Freq: Every day | ORAL | Status: DC
  Administered 2020-11-17 – 2020-11-22 (×6): 81 mg via ORAL
  Filled 2020-11-17 (×6): qty 1

## 2020-11-17 MED ORDER — PANTOPRAZOLE SODIUM 40 MG PO TBEC
40.0000 mg | DELAYED_RELEASE_TABLET | Freq: Every day | ORAL | Status: DC
  Administered 2020-11-17 – 2020-11-22 (×6): 40 mg via ORAL
  Filled 2020-11-17 (×5): qty 1

## 2020-11-17 MED ORDER — ATORVASTATIN CALCIUM 10 MG PO TABS
20.0000 mg | ORAL_TABLET | Freq: Every day | ORAL | Status: DC
  Administered 2020-11-17 – 2020-11-22 (×6): 20 mg via ORAL
  Filled 2020-11-17 (×6): qty 2

## 2020-11-17 MED ORDER — LEVOTHYROXINE SODIUM 25 MCG PO TABS
25.0000 ug | ORAL_TABLET | Freq: Every day | ORAL | Status: DC
  Administered 2020-11-17 – 2020-11-22 (×6): 25 ug via ORAL
  Filled 2020-11-17 (×6): qty 1

## 2020-11-17 NOTE — Progress Notes (Signed)
NAME:  [name], MRN:  [id number], DOB:  1946/03/19, LOS: 3 ADMISSION DATE:  11/14/2020, CONSULTATION DATE:  1/13 REFERRING MD:  EDP/ Zammit, CHIEF COMPLAINT:  resp distress   Brief History:  87 yobm remote smoker > 6 m since [place] with RA/ PF 02 dep at baseline  f/b Ramaswamy/ Beekman brought in by ambulance with sats intially 48% on 4lpm though improved on bipap and PCCM services asked to consult pm 1/13    History of Present Illness:  Unable to obtain hx due to resp extremis - per Ramaswamy's office note he is very stoic and typically does not give reliable symptom hx even when seen in office but overall pattern has been one of progressive dz and intolerance to multiple PF /copd meds   Past Medical History:  Arthritis RA with ILD   COPD on list  though most of pfts are restrictive with no curvature on f/v loop Diabetes mellitus Hypertension Pneumonia Thyroid disease.    Significant Hospital Events:   Placed on bipap in ER initially  Consults:  PCCM  Procedures:    Significant Diagnostic Tests:  CTA 11/16/19 Bilateral patchy ground galss opacities/consolidation superimposed on chronic ILD. Honeycombing.   Micro Data:  Resp viral panel/ PCR  1/13 for covid / flu both neg MRSA  PCR  1/13>>> BC x 1  1/13  >>>    Antimicrobials:  Vancomycin 1/13>1/14 Rocephin 1/13> Zmax 1/13>  Interim History / Subjective:  Tolerating heated high flow. No BiPAP needed overnight.  Objective   Blood pressure (!) 151/70, pulse (!) 102, temperature 98.3 F (36.8 C), temperature source Oral, resp. rate (!) 31, height 5' 6" (1.676 m), weight 72.6 kg, SpO2 95 %.    FiO2 (%):  [50 %-60 %] 60 %   Intake/Output Summary (Last 24 hours) at 11/17/2020 0814 Last data filed at 11/17/2020 0500 Gross per 24 hour  Intake 1789.84 ml  Output 1375 ml  Net 414.84 ml   Filed Weights   11/14/20 1229  Weight: 72.6 kg   Physical Exam: General: Well-appearing, no acute distress, on heated high  flow [place] HENT: [place], AT, OP clear, MMM Eyes: EOMI, no scleral icterus Respiratory: Course breath sounds bilaterally. No wheezing Cardiovascular: RRR, -M/R/G, no JVD GI: BS+, soft, nontender Extremities:-Edema,-tenderness Neuro: AAO x4, CNII-XII grossly intact Skin: Intact, no rashes or bruising  Resolved Hospital Problem list      Assessment & Plan:  Acute on chronic resp failure with hypercabia and hypoxemia secondary CAP ILD exacerbation ?Pulmonary hypertension contributing - CTA neg for PE. Findings concerning for pneumonia/ILD exacerbation - Wean heated high flow when able. BiPAP as needed - Continue antibiotics for CAP coverage x 5 d - Solumedrol 80 mg IV q 12 h with plan to taper pending improvement - Diuresis today for goal to maintain net event - Cardiology consult for consideration of RHC prior to initiation of Treprostinil though may be of marginal benefit based on available studies - Palliative consulted. Appreciate assistance: Patient wishes to remain full code - Scheduled Pulmicort and Duonebs  HTN - PRN hydralazine - Diuresis as above  AODM - SSI  RA - Hold imuran in setting of possible infection  Hypothyroid - Restart levothyroxine  Best practice (evaluated daily)  Diet: Clear liquid diet Pain/Anxiety/Delirium protocol (if indicated): n/a VAP protocol (if indicated): n/a DVT prophylaxis: sq hep GI prophylaxis: ppi  Glucose control: n/a  Mobility: bed rest for now  Disposition: Remain in ICU  Goals of Care:  Last date  of multidisciplinary goals of care discussion: 1/15 Family and staff present: Palliative Care Team and patient Summary of discussion: Full code Follow up goals of care discussion due:  Code Status: FULL code, avoid et if possible though does not appear endstage to me  Labs   CBC: Recent Labs  Lab 11/14/20 1233 11/16/20 0100 11/17/20 0045  WBC 8.4 12.5* 8.2  NEUTROABS 6.7  --   --   HGB 12.3* 10.7* 11.0*  HCT 40.7 35.3* 34.0*   MCV 105.7* 103.2* 99.1  PLT 240 210 102    Basic Metabolic Panel: Recent Labs  Lab 11/14/20 1233 11/15/20 0605 11/16/20 0100 11/17/20 0045  NA 139  --  147* 145  K 4.3  --  4.6 3.7  CL 101  --  110 107  CO2 25  --  26 26  GLUCOSE 171*  --  186* 210*  BUN 15  --  17 17  CREATININE 0.95 0.89 0.89 0.85  CALCIUM 8.8*  --  8.5* 8.4*   GFR: Estimated Creatinine Clearance: 67.8 mL/min (by C-G formula based on SCr of 0.85 mg/dL). Recent Labs  Lab 11/14/20 1233 11/14/20 1325 11/14/20 1527 11/14/20 1621 11/15/20 0605 11/16/20 0100 11/17/20 0045  PROCALCITON  --   --   --  0.41 0.81 0.58  --   WBC 8.4  --   --   --   --  12.5* 8.2  LATICACIDVEN  --  3.7* 5.3*  --   --   --   --     Liver Function Tests: Recent Labs  Lab 11/14/20 1233  AST 25  ALT 14  ALKPHOS 36*  BILITOT 1.0  PROT 7.4  ALBUMIN 3.6   No results for input(s): LIPASE, AMYLASE in the last 168 hours. No results for input(s): AMMONIA in the last 168 hours.  ABG    Component Value Date/Time   PHART 7.306 (L) 11/14/2020 1436   PCO2ART 47.6 11/14/2020 1436   PO2ART 77.3 (L) 11/14/2020 1436   HCO3 21.9 11/14/2020 1436   TCO2 31 05/21/2020 1034   ACIDBASEDEF 2.3 (H) 11/14/2020 1436   O2SAT 89.8 11/14/2020 1436     Coagulation Profile: Recent Labs  Lab 11/14/20 1233  INR 1.1    Cardiac Enzymes: No results for input(s): CKTOTAL, CKMB, CKMBINDEX, TROPONINI in the last 168 hours.  HbA1C: Hgb A1c MFr Bld  Date/Time Value Ref Range Status  11/14/2020 04:21 PM 7.1 (H) 4.8 - 5.6 % Final    Comment:    (NOTE) Pre diabetes:          5.7%-6.4%  Diabetes:              >6.4%  Glycemic control for   <7.0% adults with diabetes   12/15/2011 06:55 PM 7.8 (H) <5.7 % Final    Comment:    (NOTE)                                                                       According to the ADA Clinical Practice Recommendations for 2011, when HbA1c is used as a screening test:  >=6.5%   Diagnostic of Diabetes  [place]           (if abnormal result is confirmed) 5.7-6.4%  Increased risk of developing Diabetes Mellitus References:Diagnosis and Classification of Diabetes Mellitus,Diabetes RAXE,9407,68(GSUPJ 1):S62-S69 and Standards of Medical Care in         Diabetes - 2011,Diabetes SRPR,9458,59 (Suppl 1):S11-S61.    CBG: Recent Labs  Lab 11/16/20 0715 11/16/20 1120 11/16/20 1612 11/16/20 2210 11/17/20 0743  GLUCAP 165* 144* 158* 193* 178*         The patient is critically ill with multiple organ systems failure and requires high complexity decision making for assessment and support, frequent evaluation and titration of therapies, application of advanced monitoring technologies and extensive interpretation of multiple databases.  Independent Critical Care Time: 31 Minutes.   Rodman Pickle, [name] [facility] Pulmonary/Critical Care Medicine 11/17/2020 8:14 AM   Please see Amion for pager number to reach on-call Pulmonary and Critical Care Team.

## 2020-11-18 ENCOUNTER — Encounter (HOSPITAL_COMMUNITY)
Admission: EM | Disposition: A | Discharge status: Home / Self Care | Payer: Self-pay | Source: Home / Self Care | Attending: Pulmonary Disease

## 2020-11-18 DIAGNOSIS — J9622 Acute and chronic respiratory failure with hypercapnia: Secondary | ICD-10-CM | POA: Diagnosis not present

## 2020-11-18 DIAGNOSIS — I2721 Secondary pulmonary arterial hypertension: Secondary | ICD-10-CM | POA: Diagnosis not present

## 2020-11-18 DIAGNOSIS — J9621 Acute and chronic respiratory failure with hypoxia: Secondary | ICD-10-CM | POA: Diagnosis not present

## 2020-11-18 HISTORY — PX: RIGHT HEART CATH: CATH118263

## 2020-11-18 LAB — POCT I-STAT EG7
Acid-Base Excess: 4 mmol/L — ABNORMAL HIGH (ref 0.0–2.0)
Acid-Base Excess: 4 mmol/L — ABNORMAL HIGH (ref 0.0–2.0)
Acid-Base Excess: 5 mmol/L — ABNORMAL HIGH (ref 0.0–2.0)
Bicarbonate: 29.4 mmol/L — ABNORMAL HIGH (ref 20.0–28.0)
Bicarbonate: 30.4 mmol/L — ABNORMAL HIGH (ref 20.0–28.0)
Bicarbonate: 31.3 mmol/L — ABNORMAL HIGH (ref 20.0–28.0)
Calcium, Ion: 1.05 mmol/L — ABNORMAL LOW (ref 1.15–1.40)
Calcium, Ion: 1.13 mmol/L — ABNORMAL LOW (ref 1.15–1.40)
Calcium, Ion: 1.14 mmol/L — ABNORMAL LOW (ref 1.15–1.40)
HCT: 33 % — ABNORMAL LOW (ref 39.0–52.0)
HCT: 34 % — ABNORMAL LOW (ref 39.0–52.0)
HCT: 34 % — ABNORMAL LOW (ref 39.0–52.0)
Hemoglobin: 11.2 g/dL — ABNORMAL LOW (ref 13.0–17.0)
Hemoglobin: 11.6 g/dL — ABNORMAL LOW (ref 13.0–17.0)
Hemoglobin: 11.6 g/dL — ABNORMAL LOW (ref 13.0–17.0)
O2 Saturation: 74 %
O2 Saturation: 76 %
O2 Saturation: 77 %
Potassium: 3.9 mmol/L (ref 3.5–5.1)
Potassium: 4.1 mmol/L (ref 3.5–5.1)
Potassium: 4.1 mmol/L (ref 3.5–5.1)
Sodium: 141 mmol/L (ref 135–145)
Sodium: 141 mmol/L (ref 135–145)
Sodium: 143 mmol/L (ref 135–145)
TCO2: 31 mmol/L (ref 22–32)
TCO2: 32 mmol/L (ref 22–32)
TCO2: 33 mmol/L — ABNORMAL HIGH (ref 22–32)
pCO2, Ven: 49.1 mmHg (ref 44.0–60.0)
pCO2, Ven: 50.1 mmHg (ref 44.0–60.0)
pCO2, Ven: 53.6 mmHg (ref 44.0–60.0)
pH, Ven: 7.375 (ref 7.250–7.430)
pH, Ven: 7.385 (ref 7.250–7.430)
pH, Ven: 7.39 (ref 7.250–7.430)
pO2, Ven: 41 mmHg (ref 32.0–45.0)
pO2, Ven: 42 mmHg (ref 32.0–45.0)
pO2, Ven: 43 mmHg (ref 32.0–45.0)

## 2020-11-18 LAB — BASIC METABOLIC PANEL
Anion gap: 12 (ref 5–15)
BUN: 16 mg/dL (ref 8–23)
CO2: 27 mmol/L (ref 22–32)
Calcium: 7.8 mg/dL — ABNORMAL LOW (ref 8.9–10.3)
Chloride: 102 mmol/L (ref 98–111)
Creatinine, Ser: 0.87 mg/dL (ref 0.61–1.24)
GFR, Estimated: >60 mL/min (ref >60)
Glucose, Bld: 201 mg/dL — ABNORMAL HIGH (ref 70–99)
Potassium: 3.3 mmol/L — ABNORMAL LOW (ref 3.5–5.1)
Sodium: 141 mmol/L (ref 135–145)

## 2020-11-18 LAB — GLUCOSE, CAPILLARY
Glucose-Capillary: 188 mg/dL — ABNORMAL HIGH (ref 70–99)
Glucose-Capillary: 256 mg/dL — ABNORMAL HIGH (ref 70–99)
Glucose-Capillary: 306 mg/dL — ABNORMAL HIGH (ref 70–99)
Glucose-Capillary: 223 mg/dL — ABNORMAL HIGH (ref 70–99)

## 2020-11-18 LAB — CBC
HCT: 33.8 % — ABNORMAL LOW (ref 39.0–52.0)
Hemoglobin: 10.5 g/dL — ABNORMAL LOW (ref 13.0–17.0)
MCH: 30.9 pg (ref 26.0–34.0)
MCHC: 31.1 g/dL (ref 30.0–36.0)
MCV: 99.4 fL (ref 80.0–100.0)
Platelets: 185 10*3/uL (ref 150–400)
RBC: 3.4 MIL/uL — ABNORMAL LOW (ref 4.22–5.81)
RDW: 17.1 % — ABNORMAL HIGH (ref 11.5–15.5)
WBC: 5.6 10*3/uL (ref 4.0–10.5)
nRBC: 0.9 % — ABNORMAL HIGH (ref 0.0–0.2)

## 2020-11-18 SURGERY — RIGHT HEART CATH
Anesthesia: LOCAL

## 2020-11-18 MED ORDER — SODIUM CHLORIDE 0.9% FLUSH
3.0000 mL | Freq: Two times a day (BID) | INTRAVENOUS | Status: DC
  Administered 2020-11-18 – 2020-11-22 (×9): 3 mL via INTRAVENOUS

## 2020-11-18 MED ORDER — HEPARIN (PORCINE) IN NACL 1000-0.9 UT/500ML-% IV SOLN
INTRAVENOUS | Status: DC | PRN
  Administered 2020-11-18: 500 mL

## 2020-11-18 MED ORDER — FUROSEMIDE 10 MG/ML IJ SOLN: 40.0000 mg | Freq: Once | INTRAMUSCULAR | Status: DC

## 2020-11-18 MED ORDER — PREDNISONE 50 MG PO TABS
60.0000 mg | ORAL_TABLET | Freq: Every day | ORAL | Status: DC
  Administered 2020-11-19 – 2020-11-22 (×4): 60 mg via ORAL
  Filled 2020-11-18: qty 1
  Filled 2020-11-18: qty 3
  Filled 2020-11-18: qty 1
  Filled 2020-11-18: qty 3

## 2020-11-18 MED ORDER — POTASSIUM CHLORIDE 20 MEQ PO PACK
40.0000 meq | PACK | Freq: Once | ORAL | Status: AC
  Administered 2020-11-18: 40 meq
  Filled 2020-11-18: qty 2

## 2020-11-18 MED ORDER — SODIUM CHLORIDE 0.9 % IV SOLN: INTRAVENOUS | Status: DC

## 2020-11-18 MED ORDER — LIDOCAINE HCL (PF) 1 % IJ SOLN
INTRAMUSCULAR | Status: AC
  Filled 2020-11-18: qty 30

## 2020-11-18 MED ORDER — LIDOCAINE HCL (PF) 1 % IJ SOLN
INTRAMUSCULAR | Status: DC | PRN
  Administered 2020-11-18 (×3): 2 mL

## 2020-11-18 MED ORDER — ASPIRIN 81 MG PO CHEW
81.0000 mg | CHEWABLE_TABLET | ORAL | Status: AC
  Administered 2020-11-19: 81 mg via ORAL
  Filled 2020-11-18: qty 1

## 2020-11-18 MED ORDER — SODIUM CHLORIDE 0.9 % IV SOLN: 250.0000 mL | INTRAVENOUS | Status: DC | PRN

## 2020-11-18 MED ORDER — SODIUM CHLORIDE 0.9% FLUSH: 3.0000 mL | INTRAVENOUS | Status: DC | PRN

## 2020-11-18 MED ORDER — HEPARIN (PORCINE) IN NACL 1000-0.9 UT/500ML-% IV SOLN
INTRAVENOUS | Status: AC
  Filled 2020-11-18: qty 500

## 2020-11-18 MED ORDER — POTASSIUM CHLORIDE 20 MEQ PO PACK
40.0000 meq | PACK | ORAL | Status: AC
  Administered 2020-11-18: 40 meq
  Filled 2020-11-18: qty 2

## 2020-11-18 SURGICAL SUPPLY — 7 items
CATH BALLN WEDGE 5F 110CM (CATHETERS) ×1 IMPLANT
KIT MICROPUNCTURE NIT STIFF (SHEATH) ×3 IMPLANT
PACK CARDIAC CATHETERIZATION (CUSTOM PROCEDURE TRAY) ×2 IMPLANT
SHEATH GLIDE SLENDER 4/5FR (SHEATH) ×2 IMPLANT
TRANSDUCER W/STOPCOCK (MISCELLANEOUS) ×2 IMPLANT
WIRE EMERALD 3MM-J .025X260CM (WIRE) ×2 IMPLANT
WIRE MICROINTRODUCER 60CM (WIRE) ×1 IMPLANT

## 2020-11-18 NOTE — Progress Notes (Signed)
Transported patient from cath lab to room 4N29 on BIPAP and placed back on HHFNC at 20 L/50%.  Tolerated transport well, sat 98%.

## 2020-11-18 NOTE — Progress Notes (Signed)
Patient taken off [place], placed on Bipap, and transported to cath lab for a procedure. No complications noted during transport.

## 2020-11-18 NOTE — Progress Notes (Addendum)
Advanced Heart Failure Rounding Note  PCP-Cardiologist: Minus Breeding, MD   Subjective:    Echo repeated 1/14 demonstrating severely elevated PASP, 67.6 mmHg. RV systolic function normal, mod TR. RAP 8.  LVEF normal, 65-70%.    Chest CT showed no evidence of PE.   Off BiPAP. On HFNC 20L/min. No complaints. On for [place] later today.   Objective:   Weight Range: 72.6 kg Body mass index is 25.82 kg/m.   Vital Signs:   Temp:  [97 F (36.1 C)-98.2 F (36.8 C)] 97.3 F (36.3 C) (01/17 0800) Pulse Rate:  [84-116] 101 (01/17 0754) Resp:  [20-41] 31 (01/17 0754) BP: (138-182)/(65-89) 148/71 (01/17 0754) SpO2:  [86 %-96 %] 93 % (01/17 0754) FiO2 (%):  [40 %-50 %] 40 % (01/17 0754) Last BM Date:  (t)  Weight change: Filed Weights   11/14/20 1229  Weight: 72.6 kg    Intake/Output:   Intake/Output Summary (Last 24 hours) at 11/18/2020 0851 Last data filed at 11/18/2020 0400 Gross per 24 hour  Intake 2160.68 ml  Output 2900 ml  Net -739.32 ml      Physical Exam    General: fatigue appearing AAM. No resp difficulty HEENT: Normal Neck: Supple. No JVP  . Carotids 2+ bilat; no bruits. No lymphadenopathy or thyromegaly appreciated. Cor: PMI nondisplaced. Regular Rhythm and tachy rate. No rubs, gallops or murmurs. Lungs: course BS bilaterally  Abdomen: Soft, nontender, nondistended. No hepatosplenomegaly. No bruits or masses. Good bowel sounds. Extremities: No cyanosis, clubbing, rash, edema + SCDs  Neuro: Alert & orientedx3, cranial nerves grossly intact. moves all 4 extremities w/o difficulty. Affect pleasant   Telemetry   Sinus tach low 100s   EKG    No new EKG to review   Labs    CBC Recent Labs    11/17/20 0045 11/18/20 0435  WBC 8.2 5.6  HGB 11.0* 10.5*  HCT 34.0* 33.8*  MCV 99.1 99.4  PLT 227 419   Basic Metabolic Panel Recent Labs    11/17/20 0045 11/18/20 0435  NA 145 141  K 3.7 3.3*  CL 107 102  CO2 26 27  GLUCOSE 210* 201*  BUN 17 16   CREATININE 0.85 0.87  CALCIUM 8.4* 7.8*   Liver Function Tests No results for input(s): AST, ALT, ALKPHOS, BILITOT, PROT, ALBUMIN in the last 72 hours. No results for input(s): LIPASE, AMYLASE in the last 72 hours. Cardiac Enzymes No results for input(s): CKTOTAL, CKMB, CKMBINDEX, TROPONINI in the last 72 hours.  BNP: BNP (last 3 results) Recent Labs    11/14/20 1235  BNP 98.0    ProBNP (last 3 results) No results for input(s): PROBNP in the last 8760 hours.   D-Dimer No results for input(s): DDIMER in the last 72 hours. Hemoglobin A1C No results for input(s): HGBA1C in the last 72 hours. Fasting Lipid Panel No results for input(s): CHOL, HDL, LDLCALC, TRIG, CHOLHDL, LDLDIRECT in the last 72 hours. Thyroid Function Tests No results for input(s): TSH, T4TOTAL, T3FREE, THYROIDAB in the last 72 hours.  Invalid input(s): FREET3  Other results:   Imaging     No results found.   Medications:     Scheduled Medications: . amLODipine  10 mg Oral Daily  . aspirin EC  81 mg Oral Daily  . atorvastatin  20 mg Oral Daily  . budesonide (PULMICORT) nebulizer solution  0.25 mg Nebulization BID  . Chlorhexidine Gluconate Cloth  6 each Topical Daily  . heparin injection (subcutaneous)  5,000  Units Subcutaneous Q8H  . insulin aspart  0-15 Units Subcutaneous TID WC  . ipratropium-albuterol  3 mL Nebulization Q6H  . levothyroxine  25 mcg Oral Daily  . methylPREDNISolone (SOLU-MEDROL) injection  80 mg Intravenous Daily  . pantoprazole  40 mg Oral Q1200  . potassium chloride  40 mEq Per Tube Q4H     Infusions: . azithromycin Stopped (11/17/20 1201)  . cefTRIAXone (ROCEPHIN)  IV Stopped (11/17/20 1038)     PRN Medications:  hydrALAZINE, metoprolol tartrate, morphine injection    Assessment/Plan   1. Acute on chronic respiratory failure in patient with known pulmonary fibrosis and mild pulmonary hypertension: patient presented with profound hypoxia with O2 sat of  48% on EMS arrival in the setting of being out of his home O2. Quickly improved with BiPAP, however attempts to wean to HFNC have been unsuccessful with O2 sat drops to 60s. BNP was 98 and no volume overload on exam. He had evidence of superimposed infection on CXR. He has been on IV antibiotics and steroids. Ddimer elevated to 3.0 but chest CT shows no evidence of PE. In review of his prior pulmHTN work up, there appears to be a significant discrepancy between PA pressures on [place] 05/2020 and echo 06/2020 with RHC reading high normal PA pressures at 50mHg and echo suggesting severely elevated pressures at 879mg. Echo done this admit also demonstrates severely elevated PASP, 67.6 mmHg. RV systolic function normal, mod TR. RAP 8.  LVEF normal, 65-70%. [name] now off BiPAP and on HFNC.  - Unclear what benefit Treprostinil may have though would be reasonable to pursue a RHC to definitively assess his PA pressures. Plan RHC today w/ Dr. BeHaroldine Laws- Continue IV antibiotics and supportive care per PCCM - palliative care team following, he is currently full code.   2. HTN:  - stable/ controlled.  - continue amlodipine 10   3. Non-obstructive CAD: [facility] No complaints of chest pain. EKG non-ischemic.  - continue ASA 81 + atorva 20   4. Pulmonary fibrosis: follows outpatient with Dr. RaChase Callerith progressive disease c/b medication intolerances. Overall poor prognosis. Palliative care following - Continue management per primary team  5. DM type 2: A1C 7.1 this admission - Continue ISS per primary team  6. Hypokalemia - K 3.3 - supp  KCl ordered     Length of Stay: 4 [address]PA-C  11/18/2020, 8:51 AM  Advanced Heart Failure Team Pager [phone]-F; 7a - 4p)  Please contact [facility] for night-coverage after hours (4p -7a ) and weekends on [email or web address]  Patient seen and examined with the above-signed Advanced Practice Provider and/or Housestaff.  I personally reviewed laboratory data, imaging studies and relevant notes. I independently examined the patient and formulated the important aspects of the plan. I have edited the note to reflect any of my changes or salient points. I have personally discussed the plan with the patient and/or family.  Respiratory status improved with steroids and abx. Off BIPAP.   General:  Sitting up in bed. Mildly tachypneic HEENT: normal Neck: supple. no JVD. Carotids 2+ bilat; no bruits. No lymphadenopathy or thryomegaly appreciated. Cor: PMI nondisplaced. Regular tachy. No rubs, gallops or murmurs. Lungs: +crackles Abdomen: soft, nontender, nondistended. No hepatosplenomegaly. No bruits or masses. Good bowel sounds. Extremities: no cyanosis, clubbing, rash, edema Neuro: alert & orientedx3, cranial nerves grossly intact. moves all 4 extremities w/o difficulty. Affect pleasant  Respiratory status remains tenuous but is improved.  I have discussed with Dr. Loanne Drilling. Will plan RHC today to further evaluate for possible inhaled treprostinil therapy.   Glori Bickers, MD

## 2020-11-18 NOTE — Progress Notes (Addendum)
NAME:  [name], MRN:  [id number], DOB:  1945-11-03, LOS: 4 ADMISSION DATE:  11/14/2020, CONSULTATION DATE:  1/13 REFERRING MD:  EDP/ Zammit, CHIEF COMPLAINT:  resp distress   Brief History:  74 yobm remote smoker > 6 m since [place] with RA/ PF 02 dep at baseline  f/b Ramaswamy/ Beekman brought in by ambulance with sats intially 48% on 4lpm though improved on bipap and PCCM services asked to consult pm 1/13. Admitted to ICU for ILD exacerbation   History of Present Illness:  Unable to obtain hx due to resp extremis - per Ramaswamy's office note he is very stoic and typically does not give reliable symptom hx even when seen in office but overall pattern has been one of progressive dz and intolerance to multiple PF /copd meds   Past Medical History:  Arthritis RA with ILD   COPD on list  though most of pfts are restrictive with no curvature on f/v loop Diabetes mellitus Hypertension Pneumonia Thyroid disease.    Significant Hospital Events:   Placed on bipap in ER initially  Consults:  PCCM  Procedures:    Significant Diagnostic Tests:  CTA 11/16/19 Bilateral patchy ground galss opacities/consolidation superimposed on chronic ILD. Honeycombing.   Micro Data:  Resp viral panel/ PCR  1/13 for covid / flu both neg MRSA  PCR  1/13>>> BC x 1  1/13  >>>    Antimicrobials:  Vancomycin 1/13>1/14 Rocephin 1/13> Zmax 1/13>  Interim History / Subjective:  Weaned to FIO2 40% on 20L. Speaking full sentences. Reports dyspnea on exertion though improved since admission  Objective   Blood pressure (!) 148/71, pulse (!) 101, temperature (!) 97.3 F (36.3 C), temperature source Oral, resp. rate (!) 31, height 5' 6" (1.676 m), weight 72.6 kg, SpO2 93 %.    FiO2 (%):  [40 %-50 %] 40 %   Intake/Output Summary (Last 24 hours) at 11/18/2020 0848 Last data filed at 11/18/2020 0400 Gross per 24 hour  Intake 2160.68 ml  Output 2900 ml  Net -739.32 ml   Filed Weights   11/14/20 1229   Weight: 72.6 kg   Physical Exam: General: Well-appearing, no acute distress HENT: [place], AT, OP clear, MMM Eyes: EOMI, no scleral icterus Respiratory: Scattered wheezes bilaterally. Bibasilar crackles Cardiovascular: RRR, -M/R/G, no JVD GI: BS+, soft, nontender Extremities:-Edema,-tenderness Neuro: AAO x4, CNII-XII grossly intact Skin: Intact, no rashes or bruising Psych: Normal mood, normal affect  Resolved Hospital Problem list      Assessment & Plan:  Acute on chronic resp failure with hypercabia and hypoxemia secondary CAP ILD exacerbation ?Pulmonary hypertension contributing - CTA neg for PE. Findings concerning for pneumonia/ILD exacerbation - Transition from heated high flow to HFNC - Complete antibiotics for CAP coverage x 5 d. End date 1/17 - Transition from solumedrol to prednisone 60 mg tomorrow. Taper pending clinical improvement - Repeat diuresis today for goal to maintain net event - Cardiology consult for consideration of RHC prior to initiation of Treprostinil though may be of marginal benefit based on available studies - Palliative consulted. Appreciate assistance: Patient wishes to remain full code - Scheduled Pulmicort and Duonebs  HTN - PRN hydralazine - Diuresis as above  AODM - SSI  RA - Hold imuran in setting of possible infection  Hypothyroid - Continue levothyroxine  Best practice (evaluated daily)  Diet: Clear liquid diet Pain/Anxiety/Delirium protocol (if indicated): n/a VAP protocol (if indicated): n/a DVT prophylaxis: sq hep GI prophylaxis: ppi  Glucose control: n/a  Mobility: bed  rest for now  Disposition: Remain in ICU  Goals of Care:  Last date of multidisciplinary goals of care discussion: 1/15 Family and staff present: Palliative Care Team and patient Summary of discussion: Full code Follow up goals of care discussion due:  Code Status: FULL code, avoid et if possible  Labs   CBC: Recent Labs  Lab 11/14/20 1233  11/16/20 0100 11/17/20 0045 11/18/20 0435  WBC 8.4 12.5* 8.2 5.6  NEUTROABS 6.7  --   --   --   HGB 12.3* 10.7* 11.0* 10.5*  HCT 40.7 35.3* 34.0* 33.8*  MCV 105.7* 103.2* 99.1 99.4  PLT 240 210 227 891    Basic Metabolic Panel: Recent Labs  Lab 11/14/20 1233 11/15/20 0605 11/16/20 0100 11/17/20 0045 11/18/20 0435  NA 139  --  147* 145 141  K 4.3  --  4.6 3.7 3.3*  CL 101  --  110 107 102  CO2 25  --  _0 GLUCOSE 171*  --  186* 210* 201*  BUN 15  --  _1 CREATININE 0.95 0.89 0.89 0.85 0.87  CALCIUM 8.8*  --  8.5* 8.4* 7.8*   GFR: Estimated Creatinine Clearance: 66.2 mL/min (by C-G formula based on SCr of 0.87 mg/dL). Recent Labs  Lab 11/14/20 1233 11/14/20 1325 11/14/20 1527 11/14/20 1621 11/15/20 0605 11/16/20 0100 11/17/20 0045 11/18/20 0435  PROCALCITON  --   --   --  0.41 0.81 0.58  --   --   WBC 8.4  --   --   --   --  12.5* 8.2 5.6  LATICACIDVEN  --  3.7* 5.3*  --   --   --   --   --     Liver Function Tests: Recent Labs  Lab 11/14/20 1233  AST 25  ALT 14  ALKPHOS 36*  BILITOT 1.0  PROT 7.4  ALBUMIN 3.6   No results for input(s): LIPASE, AMYLASE in the last 168 hours. No results for input(s): AMMONIA in the last 168 hours.  ABG    Component Value Date/Time   PHART 7.306 (L) 11/14/2020 1436   PCO2ART 47.6 11/14/2020 1436   PO2ART 77.3 (L) 11/14/2020 1436   HCO3 21.9 11/14/2020 1436   TCO2 31 05/21/2020 1034   ACIDBASEDEF 2.3 (H) 11/14/2020 1436   O2SAT 89.8 11/14/2020 1436     Coagulation Profile: Recent Labs  Lab 11/14/20 1233  INR 1.1    Cardiac Enzymes: No results for input(s): CKTOTAL, CKMB, CKMBINDEX, TROPONINI in the last 168 hours.  HbA1C: Hgb A1c MFr Bld  Date/Time Value Ref Range Status  11/14/2020 04:21 PM 7.1 (H) 4.8 - 5.6 % Final    Comment:    (NOTE) Pre diabetes:          5.7%-6.4%  Diabetes:              >6.4%  Glycemic control for   <7.0% adults with diabetes   12/15/2011 06:55 PM 7.8 (H)  <5.7 % Final    Comment:    (NOTE)                                                                       According to the ADA  Clinical Practice Recommendations for 2011, when HbA1c is used as a screening test:  >=6.5%   Diagnostic of Diabetes Mellitus           (if abnormal result is confirmed) 5.7-6.4%   Increased risk of developing Diabetes Mellitus References:Diagnosis and Classification of Diabetes Mellitus,Diabetes YGBB,3882,66(UAOUM 1):S62-S69 and Standards of Medical Care in         Diabetes - 2011,Diabetes NARU,2400,18 (Suppl 1):S11-S61.    CBG: Recent Labs  Lab 11/17/20 0743 11/17/20 1155 11/17/20 1542 11/17/20 2137 11/18/20 0821  GLUCAP 178* 205* 144* 265* 188*        The patient is critically ill with multiple organ systems failure and requires high complexity decision making for assessment and support, frequent evaluation and titration of therapies, application of advanced monitoring technologies and extensive interpretation of multiple databases.  Independent Critical Care Time: 32 Minutes.   Rodman Pickle, [name] [facility] Pulmonary/Critical Care Medicine 11/18/2020 8:49 AM   Please see Amion for pager number to reach on-call Pulmonary and Critical Care Team.

## 2020-11-18 NOTE — H&P (View-Only) (Signed)
NAME:  [name], MRN:  [id number], DOB:  1945-11-03, LOS: 4 ADMISSION DATE:  11/14/2020, CONSULTATION DATE:  1/13 REFERRING MD:  EDP/ Zammit, CHIEF COMPLAINT:  resp distress   Brief History:  74 yobm remote smoker > 6 m since [place] with RA/ PF 02 dep at baseline  f/b Ramaswamy/ Beekman brought in by ambulance with sats intially 48% on 4lpm though improved on bipap and PCCM services asked to consult pm 1/13. Admitted to ICU for ILD exacerbation   History of Present Illness:  Unable to obtain hx due to resp extremis - per Ramaswamy's office note he is very stoic and typically does not give reliable symptom hx even when seen in office but overall pattern has been one of progressive dz and intolerance to multiple PF /copd meds   Past Medical History:  Arthritis RA with ILD   COPD on list  though most of pfts are restrictive with no curvature on f/v loop Diabetes mellitus Hypertension Pneumonia Thyroid disease.    Significant Hospital Events:   Placed on bipap in ER initially  Consults:  PCCM  Procedures:    Significant Diagnostic Tests:  CTA 11/16/19 Bilateral patchy ground galss opacities/consolidation superimposed on chronic ILD. Honeycombing.   Micro Data:  Resp viral panel/ PCR  1/13 for covid / flu both neg MRSA  PCR  1/13>>> BC x 1  1/13  >>>    Antimicrobials:  Vancomycin 1/13>1/14 Rocephin 1/13> Zmax 1/13>  Interim History / Subjective:  Weaned to FIO2 40% on 20L. Speaking full sentences. Reports dyspnea on exertion though improved since admission  Objective   Blood pressure (!) 148/71, pulse (!) 101, temperature (!) 97.3 F (36.3 C), temperature source Oral, resp. rate (!) 31, height 5' 6" (1.676 m), weight 72.6 kg, SpO2 93 %.    FiO2 (%):  [40 %-50 %] 40 %   Intake/Output Summary (Last 24 hours) at 11/18/2020 0848 Last data filed at 11/18/2020 0400 Gross per 24 hour  Intake 2160.68 ml  Output 2900 ml  Net -739.32 ml   Filed Weights   11/14/20 1229   Weight: 72.6 kg   Physical Exam: General: Well-appearing, no acute distress HENT: [place], AT, OP clear, MMM Eyes: EOMI, no scleral icterus Respiratory: Scattered wheezes bilaterally. Bibasilar crackles Cardiovascular: RRR, -M/R/G, no JVD GI: BS+, soft, nontender Extremities:-Edema,-tenderness Neuro: AAO x4, CNII-XII grossly intact Skin: Intact, no rashes or bruising Psych: Normal mood, normal affect  Resolved Hospital Problem list      Assessment & Plan:  Acute on chronic resp failure with hypercabia and hypoxemia secondary CAP ILD exacerbation ?Pulmonary hypertension contributing - CTA neg for PE. Findings concerning for pneumonia/ILD exacerbation - Transition from heated high flow to HFNC - Complete antibiotics for CAP coverage x 5 d. End date 1/17 - Transition from solumedrol to prednisone 60 mg tomorrow. Taper pending clinical improvement - Repeat diuresis today for goal to maintain net event - Cardiology consult for consideration of RHC prior to initiation of Treprostinil though may be of marginal benefit based on available studies - Palliative consulted. Appreciate assistance: Patient wishes to remain full code - Scheduled Pulmicort and Duonebs  HTN - PRN hydralazine - Diuresis as above  AODM - SSI  RA - Hold imuran in setting of possible infection  Hypothyroid - Continue levothyroxine  Best practice (evaluated daily)  Diet: Clear liquid diet Pain/Anxiety/Delirium protocol (if indicated): n/a VAP protocol (if indicated): n/a DVT prophylaxis: sq hep GI prophylaxis: ppi  Glucose control: n/a  Mobility: bed  rest for now  Disposition: Remain in ICU  Goals of Care:  Last date of multidisciplinary goals of care discussion: 1/15 Family and staff present: Palliative Care Team and patient Summary of discussion: Full code Follow up goals of care discussion due:  Code Status: FULL code, avoid et if possible  Labs   CBC: Recent Labs  Lab 11/14/20 1233  11/16/20 0100 11/17/20 0045 11/18/20 0435  WBC 8.4 12.5* 8.2 5.6  NEUTROABS 6.7  --   --   --   HGB 12.3* 10.7* 11.0* 10.5*  HCT 40.7 35.3* 34.0* 33.8*  MCV 105.7* 103.2* 99.1 99.4  PLT 240 210 227 891    Basic Metabolic Panel: Recent Labs  Lab 11/14/20 1233 11/15/20 0605 11/16/20 0100 11/17/20 0045 11/18/20 0435  NA 139  --  147* 145 141  K 4.3  --  4.6 3.7 3.3*  CL 101  --  110 107 102  CO2 25  --  _0 GLUCOSE 171*  --  186* 210* 201*  BUN 15  --  _1 CREATININE 0.95 0.89 0.89 0.85 0.87  CALCIUM 8.8*  --  8.5* 8.4* 7.8*   GFR: Estimated Creatinine Clearance: 66.2 mL/min (by C-G formula based on SCr of 0.87 mg/dL). Recent Labs  Lab 11/14/20 1233 11/14/20 1325 11/14/20 1527 11/14/20 1621 11/15/20 0605 11/16/20 0100 11/17/20 0045 11/18/20 0435  PROCALCITON  --   --   --  0.41 0.81 0.58  --   --   WBC 8.4  --   --   --   --  12.5* 8.2 5.6  LATICACIDVEN  --  3.7* 5.3*  --   --   --   --   --     Liver Function Tests: Recent Labs  Lab 11/14/20 1233  AST 25  ALT 14  ALKPHOS 36*  BILITOT 1.0  PROT 7.4  ALBUMIN 3.6   No results for input(s): LIPASE, AMYLASE in the last 168 hours. No results for input(s): AMMONIA in the last 168 hours.  ABG    Component Value Date/Time   PHART 7.306 (L) 11/14/2020 1436   PCO2ART 47.6 11/14/2020 1436   PO2ART 77.3 (L) 11/14/2020 1436   HCO3 21.9 11/14/2020 1436   TCO2 31 05/21/2020 1034   ACIDBASEDEF 2.3 (H) 11/14/2020 1436   O2SAT 89.8 11/14/2020 1436     Coagulation Profile: Recent Labs  Lab 11/14/20 1233  INR 1.1    Cardiac Enzymes: No results for input(s): CKTOTAL, CKMB, CKMBINDEX, TROPONINI in the last 168 hours.  HbA1C: Hgb A1c MFr Bld  Date/Time Value Ref Range Status  11/14/2020 04:21 PM 7.1 (H) 4.8 - 5.6 % Final    Comment:    (NOTE) Pre diabetes:          5.7%-6.4%  Diabetes:              >6.4%  Glycemic control for   <7.0% adults with diabetes   12/15/2011 06:55 PM 7.8 (H)  <5.7 % Final    Comment:    (NOTE)                                                                       According to the ADA  Clinical Practice Recommendations for 2011, when HbA1c is used as a screening test:  >=6.5%   Diagnostic of Diabetes Mellitus           (if abnormal result is confirmed) 5.7-6.4%   Increased risk of developing Diabetes Mellitus References:Diagnosis and Classification of Diabetes Mellitus,Diabetes YGBB,3882,66(UAOUM 1):S62-S69 and Standards of Medical Care in         Diabetes - 2011,Diabetes NARU,2400,18 (Suppl 1):S11-S61.    CBG: Recent Labs  Lab 11/17/20 0743 11/17/20 1155 11/17/20 1542 11/17/20 2137 11/18/20 0821  GLUCAP 178* 205* 144* 265* 188*        The patient is critically ill with multiple organ systems failure and requires high complexity decision making for assessment and support, frequent evaluation and titration of therapies, application of advanced monitoring technologies and extensive interpretation of multiple databases.  Independent Critical Care Time: 32 Minutes.   Rodman Pickle, [name] [facility] Pulmonary/Critical Care Medicine 11/18/2020 8:49 AM   Please see Amion for pager number to reach on-call Pulmonary and Critical Care Team.

## 2020-11-18 NOTE — Interval H&P Note (Signed)
History and Physical Interval Note:  11/18/2020 12:54 PM  [name]  has presented today for surgery, with the diagnosis of hp.  The various methods of treatment have been discussed with the patient and family. After consideration of risks, benefits and other options for treatment, the patient has consented to  Procedure(s): RIGHT HEART CATH (N/A) as a surgical intervention.  The patient's history has been reviewed, patient examined, no change in status, stable for surgery.  I have reviewed the patient's chart and labs.  Questions were answered to the patient's satisfaction.     Iyani Dresner

## 2020-11-19 ENCOUNTER — Telehealth (HOSPITAL_COMMUNITY): Payer: Self-pay | Admitting: Pharmacist

## 2020-11-19 ENCOUNTER — Telehealth: Payer: Self-pay | Admitting: Internal Medicine

## 2020-11-19 ENCOUNTER — Encounter (HOSPITAL_COMMUNITY): Payer: Self-pay | Admitting: Internal Medicine

## 2020-11-19 DIAGNOSIS — I2721 Secondary pulmonary arterial hypertension: Secondary | ICD-10-CM | POA: Diagnosis not present

## 2020-11-19 DIAGNOSIS — J9621 Acute and chronic respiratory failure with hypoxia: Secondary | ICD-10-CM

## 2020-11-19 LAB — GLUCOSE, CAPILLARY
Glucose-Capillary: 249 mg/dL — ABNORMAL HIGH (ref 70–99)
Glucose-Capillary: 206 mg/dL — ABNORMAL HIGH (ref 70–99)
Glucose-Capillary: 281 mg/dL — ABNORMAL HIGH (ref 70–99)
Glucose-Capillary: 179 mg/dL — ABNORMAL HIGH (ref 70–99)

## 2020-11-19 LAB — CBC
HCT: 33.6 % — ABNORMAL LOW (ref 39.0–52.0)
Hemoglobin: 10.5 g/dL — ABNORMAL LOW (ref 13.0–17.0)
MCH: 31.2 pg (ref 26.0–34.0)
MCHC: 31.3 g/dL (ref 30.0–36.0)
MCV: 99.7 fL (ref 80.0–100.0)
Platelets: 199 10*3/uL (ref 150–400)
RBC: 3.37 MIL/uL — ABNORMAL LOW (ref 4.22–5.81)
RDW: 16.4 % — ABNORMAL HIGH (ref 11.5–15.5)
WBC: 5.7 10*3/uL (ref 4.0–10.5)
nRBC: 0.9 % — ABNORMAL HIGH (ref 0.0–0.2)

## 2020-11-19 LAB — BASIC METABOLIC PANEL
Anion gap: 7 (ref 5–15)
BUN: 17 mg/dL (ref 8–23)
CO2: 29 mmol/L (ref 22–32)
Calcium: 7.8 mg/dL — ABNORMAL LOW (ref 8.9–10.3)
Chloride: 102 mmol/L (ref 98–111)
Creatinine, Ser: 0.79 mg/dL (ref 0.61–1.24)
GFR, Estimated: >60 mL/min (ref >60)
Glucose, Bld: 221 mg/dL — ABNORMAL HIGH (ref 70–99)
Potassium: 4.7 mmol/L (ref 3.5–5.1)
Sodium: 138 mmol/L (ref 135–145)

## 2020-11-19 MED ORDER — AZATHIOPRINE 50 MG PO TABS
100.0000 mg | ORAL_TABLET | Freq: Every day | ORAL | Status: DC
  Administered 2020-11-19 – 2020-11-22 (×4): 100 mg via ORAL
  Filled 2020-11-19 (×4): qty 2

## 2020-11-19 MED ORDER — INSULIN DETEMIR 100 UNIT/ML ~~LOC~~ SOLN
10.0000 [IU] | Freq: Two times a day (BID) | SUBCUTANEOUS | Status: DC
  Administered 2020-11-19 – 2020-11-22 (×7): 10 [IU] via SUBCUTANEOUS
  Filled 2020-11-19 (×9): qty 0.1

## 2020-11-19 NOTE — Progress Notes (Signed)
Inpatient Diabetes Program Recommendations  AACE/ADA: New Consensus Statement on Inpatient Glycemic Control (2015)  Target Ranges:  Prepandial:   less than 140 mg/dL      Peak postprandial:   less than 180 mg/dL (1-2 hours)      Critically ill patients:  140 - 180 mg/dL   Results for [name], CLOCK (MRN [id number]) as of 11/19/2020 06:56  Ref. Range 11/18/2020 08:21 11/18/2020 11:47 11/18/2020 16:04 11/18/2020 21:27  Glucose-Capillary Latest Ref Range: 70 - 99 mg/dL 188 (H)  3 units NOVOLOG  256 (H)  OFF floor 306 (H)  11 units NOVOLOG  223 (H)   Results for [name], BRALLIER (MRN [id number]) as of 11/19/2020 06:56  Ref. Range 11/19/2020 03:43  Glucose Latest Ref Range: 70 - 99 mg/dL 221 (H)    Home DM Meds: NPH Insulin 10 units AM/ 20 units PM       Metformin 1000 mg BID  Current Orders: Novolog Moderate Correction Scale/ SSI (0-15 units) TID AC   Solumedrol stopped--last dose given 01/17 @ 9am  Now getting Prednisone 60 mg Daily   MD- Note pt takes NPH Insulin at home.  Please consider restarting 70% of pt's home Insulin   NPH Insulin 7 units QAM and   NPH Insulin 15 units QHS    --Will follow patient during hospitalization--  Wyn Quaker RN, MSN, CDE Diabetes Coordinator Inpatient Glycemic Control Team Team Pager: [phone] (8a-5p)

## 2020-11-19 NOTE — Progress Notes (Signed)
NAME:  Kairav Russomanno, MRN:  [id number], DOB:  25-Sep-1946, LOS: 5 ADMISSION DATE:  11/14/2020, CONSULTATION DATE:  1/13 REFERRING MD:  EDP/ Zammit, CHIEF COMPLAINT:  resp distress   Brief History:  74 yobm remote smoker > 6 m since [place] with RA/ PF 02 dep at baseline  f/b Ramaswamy/ Beekman brought in by ambulance with sats intially 48% on 4lpm though improved on bipap and PCCM services asked to consult pm 1/13. Admitted to ICU for ILD exacerbation   History of Present Illness:  Unable to obtain hx due to resp extremis - per Ramaswamy's office note he is very stoic and typically does not give reliable symptom hx even when seen in office but overall pattern has been one of progressive dz and intolerance to multiple PF /copd meds   Past Medical History:  Arthritis RA with ILD   COPD on list  though most of pfts are restrictive with no curvature on f/v loop Diabetes mellitus Hypertension Pneumonia Thyroid disease.    Significant Hospital Events:  Placed on bipap in ER initially [place] 1/17 - moderate pulmonary hypertension with PAD 18, PCWP 10, PVR 3.1 WU  Consults:  PCCM  Procedures:    Significant Diagnostic Tests:  CTA 11/16/19 Bilateral patchy ground galss opacities/consolidation superimposed on chronic ILD. Honeycombing.   Micro Data:  Resp viral panel/ PCR  1/13 for covid / flu both neg MRSA  PCR  1/13>>> BC x 1  1/13  >>>  Antimicrobials:  Vancomycin 1/13>1/14 Rocephin 1/13> Zmax 1/13>  Interim History / Subjective:  Weaned to FIO2 40% on 20L. Speaking full sentences. Reports dyspnea on exertion though improved since admission. In good spirit, reports good appetite.   Objective   Blood pressure 137/67, pulse 85, temperature (!) 97.3 F (36.3 C), temperature source Oral, resp. rate (!) 25, height _0  (1.676 m), weight 80.8 kg, SpO2 98 %.    FiO2 (%):  [40 %-50 %] 50 %   Intake/Output Summary (Last 24 hours) at 11/19/2020 1149 Last data filed at 11/19/2020  1034 Gross per 24 hour  Intake 1523 ml  Output 1800 ml  Net -277 ml   Filed Weights   11/14/20 1229 11/18/20 1100  Weight: 72.6 kg 80.8 kg   Physical Exam: General: Well-appearing, no acute distress, well nourished  HENT: [place], AT, OP clear, MMM Eyes: EOMI, no scleral icterus Respiratory: Scattered wheezes bilaterally. Bibasilar crackles with squeaks.  Cardiovascular: RRR, -M/R/G, no JVD GI: BS+, soft, nontender Extremities:-Edema,-tenderness Neuro: AAO x4, CNII-XII grossly intact Skin: Intact, no rashes or bruising Psych: Normal mood, normal affect  Resolved Hospital Problem list      Assessment & Plan:  Critically ill due to acute on chronic resp failure with hypercabia and hypoxemia to ILD exacerbation - CTA neg for PE.  - Transition from heated high flow to HFNC - Complete antibiotics for CAP coverage x 5 d. End date 1/17 - Transition from solumedrol to prednisone 60 mg tomorrow. Taper pending clinical improvement - No diuresis today - Stop antibiotics.  - Palliative consulted. Appreciate assistance: Patient wishes to remain full code - Scheduled Pulmicort and Duonebs  Pulmonary hypertension demonstrated on RHC with moderate pulmonary hypertension.  - Meets PTH criteria for treprostinil initiation, but patient would need to be on less O2 (10Lpm) and more ambulatory (able to go 157min six minutes). Discussed with Drs RChase Callerand BenSimhon, will attempt to improve functional status prior to initiation.   HTN - PRN hydralazine - Diuresis as above  Type 2 Diabetes mellitus - SSI - Added Levemir bid today (on NPH tomorrow)  RA - Will resume Imuran as not signs of infection.   Hypothyroid - Continue levothyroxine  Best practice (evaluated daily)  Diet: Clear liquid diet Pain/Anxiety/Delirium protocol (if indicated): n/a VAP protocol (if indicated): n/a DVT prophylaxis: sq hep GI prophylaxis: ppi  Glucose control: n/a  Mobility: bed rest for now   Disposition: Remain in ICU  Goals of Care:  Last date of multidisciplinary goals of care discussion: 1/15 Family and staff present: Palliative Care Team and patient Summary of discussion: Full code Follow up goals of care discussion due:  Code Status: FULL code, avoid et if possible  Labs   CBC: Recent Labs  Lab 11/14/20 1233 11/16/20 0100 11/17/20 0045 11/18/20 0435 11/18/20 1335 11/18/20 1340 11/19/20 0343  WBC 8.4 12.5* 8.2 5.6  --   --  5.7  NEUTROABS 6.7  --   --   --   --   --   --   HGB 12.3* 10.7* 11.0* 10.5* 11.2*  11.6* 11.6* 10.5*  HCT 40.7 35.3* 34.0* 33.8* 33.0*  34.0* 34.0* 33.6*  MCV 105.7* 103.2* 99.1 99.4  --   --  99.7  PLT 240 210 227 185  --   --  703    Basic Metabolic Panel: Recent Labs  Lab 11/14/20 1233 11/15/20 0605 11/16/20 0100 11/17/20 0045 11/18/20 0435 11/18/20 1335 11/18/20 1340 11/19/20 0343  NA 139  --  147* 145 141 143  141 141 138  K 4.3  --  4.6 3.7 3.3* 3.9  4.1 4.1 4.7  CL 101  --  110 107 102  --   --  102  CO2 25  --  _0 --   --  29  GLUCOSE 171*  --  186* 210* 201*  --   --  221*  BUN 15  --  _1 --   --  17  CREATININE 0.95 0.89 0.89 0.85 0.87  --   --  0.79  CALCIUM 8.8*  --  8.5* 8.4* 7.8*  --   --  7.8*   GFR: Estimated Creatinine Clearance: 79.7 mL/min (by C-G formula based on SCr of 0.79 mg/dL). Recent Labs  Lab 11/14/20 1325 11/14/20 1527 11/14/20 1621 11/15/20 0605 11/16/20 0100 11/17/20 0045 11/18/20 0435 11/19/20 0343  PROCALCITON  --   --  0.41 0.81 0.58  --   --   --   WBC  --   --   --   --  12.5* 8.2 5.6 5.7  LATICACIDVEN 3.7* 5.3*  --   --   --   --   --   --     Liver Function Tests: Recent Labs  Lab 11/14/20 1233  AST 25  ALT 14  ALKPHOS 36*  BILITOT 1.0  PROT 7.4  ALBUMIN 3.6   No results for input(s): LIPASE, AMYLASE in the last 168 hours. No results for input(s): AMMONIA in the last 168 hours.  ABG    Component Value Date/Time   PHART 7.306 (L) 11/14/2020  1436   PCO2ART 47.6 11/14/2020 1436   PO2ART 77.3 (L) 11/14/2020 1436   HCO3 31.3 (H) 11/18/2020 1340   TCO2 33 (H) 11/18/2020 1340   ACIDBASEDEF 2.3 (H) 11/14/2020 1436   O2SAT 74.0 11/18/2020 1340     Coagulation Profile: Recent Labs  Lab 11/14/20 1233  INR 1.1    Cardiac Enzymes: No results for  input(s): CKTOTAL, CKMB, CKMBINDEX, TROPONINI in the last 168 hours.  HbA1C: Hgb A1c MFr Bld  Date/Time Value Ref Range Status  11/14/2020 04:21 PM 7.1 (H) 4.8 - 5.6 % Final    Comment:    (NOTE) Pre diabetes:          5.7%-6.4%  Diabetes:              >6.4%  Glycemic control for   <7.0% adults with diabetes   12/15/2011 06:55 PM 7.8 (H) <5.7 % Final    Comment:    (NOTE)                                                                       According to the ADA Clinical Practice Recommendations for 2011, when HbA1c is used as a screening test:  >=6.5%   Diagnostic of Diabetes Mellitus           (if abnormal result is confirmed) 5.7-6.4%   Increased risk of developing Diabetes Mellitus References:Diagnosis and Classification of Diabetes Mellitus,Diabetes JSUN,9914,44(PEAKL 1):S62-S69 and Standards of Medical Care in         Diabetes - 2011,Diabetes TYVD,7322,56 (Suppl 1):S11-S61.    CBG: Recent Labs  Lab 11/18/20 0821 11/18/20 1147 11/18/20 1604 11/18/20 2127 11/19/20 0811  GLUCAP 188* 256* 306* 223* 249*   CRITICAL CARE Performed by: Kipp Brood   Total critical care time: 35 minutes  Critical care time was exclusive of separately billable procedures and treating other patients.  Critical care was necessary to treat or prevent imminent or life-threatening deterioration.  Critical care was time spent personally by me on the following activities: development of treatment plan with patient and/or surrogate as well as nursing, discussions with consultants, evaluation of patient's response to treatment, examination of patient, obtaining history from patient or  surrogate, ordering and performing treatments and interventions, ordering and review of laboratory studies, ordering and review of radiographic studies, pulse oximetry, re-evaluation of patient's condition and participation in multidisciplinary rounds.  Kipp Brood, MD [facility] ICU Physician [place]  Pager: [phone] Mobile: [phone] After hours: [phone].

## 2020-11-19 NOTE — Progress Notes (Addendum)
Advanced Heart Failure Rounding Note  PCP-Cardiologist: Minus Breeding, MD   Subjective:    Echo repeated 1/14 demonstrating severely elevated PASP, 67.6 mmHg. RV systolic function normal, mod TR. RAP 8.  LVEF normal, 65-70%.    Chest CT showed no evidence of PE.   RHC yesterday demonstrated mild- mod pulmonary HTN w/ high cardiac output.  RA = 5 RV = 56/6 PA = 59/18 (34) PCW = 10 Fick cardiac output/index = 7.7/4.2 PVR = 3.1 Ao sat = 99% PA sat = 77%, 77% High SVC sat = 74%  Off BiPAP. On HFNC 20L/min. He has finished abx. Off solumedrol, now on prednisone.   Overall feeling much better. Slept well last night.   Objective:   Weight Range: 80.8 kg Body mass index is 28.75 kg/m.   Vital Signs:   Temp:  [97.3 F (36.3 C)-98.2 F (36.8 C)] 97.4 F (36.3 C) (01/18 0400) Pulse Rate:  [71-105] 80 (01/18 0600) Resp:  [15-32] 23 (01/18 0600) BP: (115-181)/(60-90) 139/71 (01/18 0600) SpO2:  [85 %-100 %] 96 % (01/18 0600) FiO2 (%):  [40 %-50 %] 50 % (01/18 0736) Weight:  [80.8 kg] 80.8 kg (01/17 1100) Last BM Date:  (t)  Weight change: Filed Weights   11/14/20 1229 11/18/20 1100  Weight: 72.6 kg 80.8 kg    Intake/Output:   Intake/Output Summary (Last 24 hours) at 11/19/2020 0738 Last data filed at 11/19/2020 0600 Gross per 24 hour  Intake 1824.37 ml  Output 1800 ml  Net 24.37 ml      Physical Exam    General: elderly AAM sitting up in bed. No resp difficulty HEENT: Normal Neck: Supple. No JVP  . Carotids 2+ bilat; no bruits. No lymphadenopathy or thyromegaly appreciated. Cor: PMI nondisplaced. RRR. No rubs, gallops or murmurs. Lungs: faint URL expiratory wheezing  Abdomen: Soft, nontender, nondistended. No hepatosplenomegaly. No bruits or masses. Good bowel sounds. Extremities: No cyanosis, clubbing, rash, edema + SCDs  Neuro: Alert & orientedx3, cranial nerves grossly intact. moves all 4 extremities w/o difficulty. Affect pleasant   Telemetry    NSR 80s   EKG    No new EKG to review   Labs    CBC Recent Labs    11/18/20 0435 11/18/20 1335 11/18/20 1340 11/19/20 0343  WBC 5.6  --   --  5.7  HGB 10.5*   < > 11.6* 10.5*  HCT 33.8*   < > 34.0* 33.6*  MCV 99.4  --   --  99.7  PLT 185  --   --  199   < > = values in this interval not displayed.   Basic Metabolic Panel Recent Labs    11/18/20 0435 11/18/20 1335 11/18/20 1340 11/19/20 0343  NA 141   < > 141 138  K 3.3*   < > 4.1 4.7  CL 102  --   --  102  CO2 27  --   --  29  GLUCOSE 201*  --   --  221*  BUN 16  --   --  17  CREATININE 0.87  --   --  0.79  CALCIUM 7.8*  --   --  7.8*   < > = values in this interval not displayed.   Liver Function Tests No results for input(s): AST, ALT, ALKPHOS, BILITOT, PROT, ALBUMIN in the last 72 hours. No results for input(s): LIPASE, AMYLASE in the last 72 hours. Cardiac Enzymes No results for input(s): CKTOTAL, CKMB, CKMBINDEX, TROPONINI in  the last 72 hours.  BNP: BNP (last 3 results) Recent Labs    11/14/20 1235  BNP 98.0    ProBNP (last 3 results) No results for input(s): PROBNP in the last 8760 hours.   D-Dimer No results for input(s): DDIMER in the last 72 hours. Hemoglobin A1C No results for input(s): HGBA1C in the last 72 hours. Fasting Lipid Panel No results for input(s): CHOL, HDL, LDLCALC, TRIG, CHOLHDL, LDLDIRECT in the last 72 hours. Thyroid Function Tests No results for input(s): TSH, T4TOTAL, T3FREE, THYROIDAB in the last 72 hours.  Invalid input(s): FREET3  Other results:   Imaging    CARDIAC CATHETERIZATION  Result Date: 11/18/2020 Findings: RA = 5 RV = 56/6 PA = 59/18 (34) PCW = 10 Fick cardiac output/index = 7.7/4.2 PVR = 3.1 Ao sat = 99% PA sat = 77%, 77% High SVC sat = 74% Assessment: 1. Mild to moderate pulmonary HTN with high cardiac output Plan/Discussion: Given PAH in setting of IPF, a trial of inhaled treprostinil seems reasonable. Glori Bickers, MD 2:25 PM      Medications:     Scheduled Medications: . amLODipine  10 mg Oral Daily  . aspirin EC  81 mg Oral Daily  . atorvastatin  20 mg Oral Daily  . budesonide (PULMICORT) nebulizer solution  0.25 mg Nebulization BID  . Chlorhexidine Gluconate Cloth  6 each Topical Daily  . heparin injection (subcutaneous)  5,000 Units Subcutaneous Q8H  . insulin aspart  0-15 Units Subcutaneous TID WC  . ipratropium-albuterol  3 mL Nebulization Q6H  . levothyroxine  25 mcg Oral Daily  . pantoprazole  40 mg Oral Q1200  . predniSONE  60 mg Oral Q breakfast  . sodium chloride flush  3 mL Intravenous Q12H    Infusions: . sodium chloride      PRN Medications: sodium chloride, hydrALAZINE, metoprolol tartrate, morphine injection, sodium chloride flush    Assessment/Plan   1. Acute on chronic respiratory failure in patient with known pulmonary fibrosis and mild-moderate pulmonary hypertension: patient presented with profound hypoxia with O2 sat of 48% on EMS arrival in the setting of being out of his home O2. Quickly improved with BiPAP, however attempts to wean to HFNC have been unsuccessful with O2 sat drops to 60s. BNP was 98 and no volume overload on exam. He had evidence of superimposed infection on CXR. He has been on IV antibiotics and steroids. Ddimer elevated to 3.0 but chest CT shows no evidence of PE. In review of his prior pulmHTN work up, there appears to be a significant discrepancy between PA pressures on [place] 05/2020 and echo 06/2020 with RHC reading high normal PA pressures at 9mHg and echo suggesting severely elevated pressures at 813mg.  - Echo done this admit also demonstrates severely elevated PASP, 67.6 mmHg. RV systolic function normal, mod TR. RAP 8.  LVEF normal, 65-70%. - Repeat RHC yesterday c/w mild to moderate pulmonary HTN with high cardiac output. PA = 59/18 (34). PCW = 10. PVR = 3.1  - Given PAH in setting of IPF, a trial of inhaled treprostinil seems reasonable.  - Has  completed antibiotics, continue steroids and supportive care per PCCM - palliative care team following, he is currently full code.    2. HTN:  - stable/ controlled.  - continue amlodipine 10   3. Non-obstructive CAD: [facility] No complaints of chest pain. EKG non-ischemic.  - continue ASA 81 + atorva 20   4. Pulmonary fibrosis:  follows outpatient with Dr. Chase Caller with progressive disease c/b medication intolerances. Overall poor prognosis. Palliative care following - Continue management per primary team  5. DM type 2: A1C 7.1 this admission - Continue ISS per primary team  6. Hypokalemia - resolved w/ supplementation  - K 4.7 today     Length of Stay: [address], PA-C  11/19/2020, 7:38 AM  Advanced Heart Failure Team Pager [phone] (M-F; 7a - 4p)  Please contact [facility] for night-coverage after hours (4p -7a ) and weekends on [email or web address]   Patient seen and examined with the above-signed Advanced Practice Provider and/or Housestaff. I personally reviewed laboratory data, imaging studies and relevant notes. I independently examined the patient and formulated the important aspects of the plan. I have edited the note to reflect any of my changes or salient points. I have personally discussed the plan with the patient and/or family.  Breathing better. [place] numbers reviewed with him   General:  Sitting up in bed. Breathign ok HEENT: normal Neck: supple. no JVD. Carotids 2+ bilat; no bruits. No lymphadenopathy or thryomegaly appreciated. Cor: PMI nondisplaced. Regular rate & rhythm. No rubs, gallops or murmurs. Lungs: + crackles Abdomen: soft, nontender, nondistended. No hepatosplenomegaly. No bruits or masses. Good bowel sounds. Extremities: no cyanosis, clubbing, rash, edema Neuro: alert & orientedx3, cranial nerves grossly intact. moves all 4 extremities w/o difficulty. Affect pleasant   Given PAH in setting of IPF, a trial of  inhaled treprostinil (starting as outpatient) seems reasonable. I will d/w Dr. Chase Caller.   The HF team will s/o. Please let us know if help is needed coordinating outpatient therapies.   Glori Bickers, MD  10:16 AM

## 2020-11-19 NOTE — Telephone Encounter (Signed)
Sent in Tyvaso enrollment application to [facility].  Application pending, will continue to follow.   Dallys Nowakowski, PharmD, BCPS, BCCP, CPP Heart Failure Clinic Pharmacist [phone]  

## 2020-11-19 NOTE — Telephone Encounter (Signed)
[name] [name]  - based on below increase study he does meet RHC criteira for Tyvaso . However, based on study criteria he needs to be  More functional  and needs o2 <= 10L at rest -> but ok for starting paperwork for opd Tyvaso  - support DR Bensimhon starting paper work for therapy of tyvaos  Thanks    SIGNATURE    Dr. Brand Males, [name], [name],  Pulmonary and Critical Care Medicine Staff Physician, [place] Director - Interstitial Lung Disease  Program  P[facility] [place] [facility] [place], [place], [address]  Pager: [phone], If no answer  OR between  19:00-7:00h: page [phone] Telephone (clinical office): [phone] Telephone (research): [phone]  11:55 AM 11/19/2020  xxxxxxx  INCREASE STUDY Include in patients with ILD   - Right heart cath :  PVR > 3, PCWP </= 15, Pmap >/=  25 -  Patient needed to be able to walk 15m/ 300 feet on a 622malk test  Exclude - LVEF < 40%,  - Baseline o2 > 10L at rest

## 2020-11-20 ENCOUNTER — Telehealth: Payer: Self-pay | Admitting: Internal Medicine

## 2020-11-20 LAB — CBC
HCT: 31 % — ABNORMAL LOW (ref 39.0–52.0)
Hemoglobin: 10.1 g/dL — ABNORMAL LOW (ref 13.0–17.0)
MCH: 31.9 pg (ref 26.0–34.0)
MCHC: 32.6 g/dL (ref 30.0–36.0)
MCV: 97.8 fL (ref 80.0–100.0)
Platelets: 201 10*3/uL (ref 150–400)
RBC: 3.17 MIL/uL — ABNORMAL LOW (ref 4.22–5.81)
RDW: 15.8 % — ABNORMAL HIGH (ref 11.5–15.5)
WBC: 6.3 10*3/uL (ref 4.0–10.5)
nRBC: 0.8 % — ABNORMAL HIGH (ref 0.0–0.2)

## 2020-11-20 LAB — GLUCOSE, CAPILLARY
Glucose-Capillary: 142 mg/dL — ABNORMAL HIGH (ref 70–99)
Glucose-Capillary: 191 mg/dL — ABNORMAL HIGH (ref 70–99)
Glucose-Capillary: 372 mg/dL — ABNORMAL HIGH (ref 70–99)
Glucose-Capillary: 239 mg/dL — ABNORMAL HIGH (ref 70–99)

## 2020-11-20 LAB — BASIC METABOLIC PANEL
Anion gap: 9 (ref 5–15)
BUN: 17 mg/dL (ref 8–23)
CO2: 26 mmol/L (ref 22–32)
Calcium: 7.8 mg/dL — ABNORMAL LOW (ref 8.9–10.3)
Chloride: 103 mmol/L (ref 98–111)
Creatinine, Ser: 0.82 mg/dL (ref 0.61–1.24)
GFR, Estimated: >60 mL/min (ref >60)
Glucose, Bld: 193 mg/dL — ABNORMAL HIGH (ref 70–99)
Potassium: 4.5 mmol/L (ref 3.5–5.1)
Sodium: 138 mmol/L (ref 135–145)

## 2020-11-20 MED ORDER — SENNOSIDES-DOCUSATE SODIUM 8.6-50 MG PO TABS
1.0000 | ORAL_TABLET | Freq: Two times a day (BID) | ORAL | Status: DC
  Administered 2020-11-20 – 2020-11-22 (×5): 1 via ORAL
  Filled 2020-11-20 (×5): qty 1

## 2020-11-20 MED ORDER — HYDROMORPHONE BOLUS VIA INFUSION
0.5000 mg | INTRAVENOUS | Status: DC | PRN
  Filled 2020-11-20: qty 1

## 2020-11-20 MED ORDER — SODIUM CHLORIDE 0.9 % IV SOLN
0.0000 mg/h | INTRAVENOUS | Status: DC
  Filled 2020-11-20: qty 5

## 2020-11-20 MED ORDER — POLYETHYLENE GLYCOL 3350 17 G PO PACK
17.0000 g | PACK | Freq: Every day | ORAL | Status: DC | PRN
  Administered 2020-11-22: 17 g via ORAL
  Filled 2020-11-20: qty 1

## 2020-11-20 NOTE — Evaluation (Signed)
Physical Therapy Evaluation Patient Details Name: [name] MRN: [id number] DOB: January 20, 1946 Today's Date: 11/20/2020   History of Present Illness  75 yo male admitted due to Chronic fibrosing intersitial lung disease exacerbation with pulmonary HTN PMH  COPD, DM HTN thyroid disease remote smoker  Clinical Impression  Pt admitted with/for exacerbation of ILD.  Pt generally at supervision to Mod I level for mobility, but still at a high level of O2 need of 10 L..  Pt currently limited functionally due to the problems listed below.  (see problems list.)  Pt will benefit from PT to maximize function and safety to be able to get home safely with available assist.     Follow Up Recommendations No PT follow up    Equipment Recommendations  None recommended by PT    Recommendations for Other Services       Precautions / Restrictions Precautions Precaution Comments: oxygen      Mobility  Bed Mobility               General bed mobility comments: oob in chair    Transfers Overall transfer level: Modified independent                  Ambulation/Gait Ambulation/Gait assistance: Supervision Gait Distance (Feet): 70 Feet Assistive device: None (pulling the portable 02) Gait Pattern/deviations: Step-through pattern Gait velocity: slower Gait velocity interpretation: <1.8 ft/sec, indicate of risk for recurrent falls General Gait Details: generally steady, pulling the portable O2.  SpO2 maintained at 99% on 10 L HFNC.  Stairs            Wheelchair Mobility    Modified Rankin (Stroke Patients Only)       Balance Overall balance assessment: Independent                                           Pertinent Vitals/Pain Pain Assessment: No/denies pain    Home Living Family/patient expects to be discharged to:: Private residence Living Arrangements: Spouse/significant other;Other (Comment) (step daughter and aunt) Available Help at  Discharge: Family Type of Home: Mobile home (double wide) Home Access: Ramped entrance     Home Layout: One level Home Equipment: Bedside commode;Walker - 2 wheels;Cane - quad (oxygen with 4L 100 ft (50x2 )) Additional Comments: dogx2 (got them 7-8 months)    Prior Function Level of Independence: Independent         Comments: driving, retired     Engineer, manufacturing [employer]   Dominant Hand: Right    Extremity/Trunk Assessment   Upper Extremity Assessment Upper Extremity Assessment: Overall WFL for tasks assessed    Lower Extremity Assessment Lower Extremity Assessment: Overall WFL for tasks assessed    Cervical / Trunk Assessment Cervical / Trunk Assessment: Normal  Communication   Communication: No difficulties  Cognition Arousal/Alertness: Awake/alert Behavior During Therapy: WFL for tasks assessed/performed Overall Cognitive Status: Within Functional Limits for tasks assessed                                        General Comments General comments (skin integrity, edema, etc.): HFNC 30% 10 L BP 141/72 (93)    Exercises     Assessment/Plan    PT Assessment Patient needs continued PT services  PT Problem List Decreased activity tolerance;Decreased mobility;Cardiopulmonary status  limiting activity       PT Treatment Interventions Gait training;Stair training;Functional mobility training;Therapeutic activities;Patient/family education    PT Goals (Current goals can be found in the Care Plan section)  Acute Rehab PT Goals Patient Stated Goal: to return home PT Goal Formulation: With patient Time For Goal Achievement: 12/04/20 Potential to Achieve Goals: Good    Frequency Min 3X/week   Barriers to discharge        Co-evaluation               AM-PAC PT "6 Clicks" Mobility  Outcome Measure Help needed turning from your back to your side while in a flat bed without using bedrails?: None Help needed moving from lying on your back to sitting  on the side of a flat bed without using bedrails?: None Help needed moving to and from a bed to a chair (including a wheelchair)?: None Help needed standing up from a chair using your arms (e.g., wheelchair or bedside chair)?: None Help needed to walk in hospital room?: A Little Help needed climbing 3-5 steps with a railing? : A Little 6 Click Score: 22    End of Session Equipment Utilized During Treatment: Oxygen Activity Tolerance: Patient tolerated treatment well Patient left: in chair;with call bell/phone within reach Nurse Communication: Mobility status PT Visit Diagnosis: Difficulty in walking, not elsewhere classified (R26.2)    Time: 8682-5749 PT Time Calculation (min) (ACUTE ONLY): 15 min   Charges:   PT Evaluation $PT Eval Low Complexity: 1 Low          11/20/2020  Ginger Carne., PT Acute Rehabilitation Services [phone]  (pager) [phone]  (office)  Tessie Fass Reily Ilic 11/20/2020, 3:21 PM

## 2020-11-20 NOTE — Progress Notes (Signed)
Pt transferred to 3E24 with no issues. RN Regino Schultze aware of pt arrival.

## 2020-11-20 NOTE — Progress Notes (Signed)
NAME:  [name], MRN:  [id number], DOB:  [birth year], LOS: 6 ADMISSION DATE:  11/14/2020, CONSULTATION DATE:  1/13 REFERRING MD:  EDP/ Zammit, CHIEF COMPLAINT:  resp distress   Brief History:  74 yobm remote smoker > 6 m since [place] with RA/ PF 02 dep at baseline  f/b Ramaswamy/ [facility] brought in by ambulance with sats intially 48% on 4lpm though improved on bipap and PCCM services asked to consult pm 1/13. Admitted to ICU for ILD exacerbation   History of Present Illness:  Unable to obtain hx due to resp extremis - per Ramaswamy's office note he is very stoic and typically does not give reliable symptom hx even when seen in office but overall pattern has been one of progressive dz and intolerance to multiple PF /copd meds   Past Medical History:  Arthritis RA with ILD   COPD on list  though most of pfts are restrictive with no curvature on f/v loop Diabetes mellitus Hypertension Pneumonia Thyroid disease.    Significant Hospital Events:  Placed on bipap in ER initially [place] 1/17 - moderate pulmonary hypertension with PAD 18, PCWP 10, PVR 3.1 WU  Consults:  PCCM  Procedures:    Significant Diagnostic Tests:  CTA 11/16/19 Bilateral patchy ground galss opacities/consolidation superimposed on chronic ILD. Honeycombing.   Micro Data:  Resp viral panel/ PCR  1/13 for covid / flu both neg MRSA  PCR  1/13>>> BC x 1  1/13  >>>  Antimicrobials:  Vancomycin 1/13>1/14 Rocephin 1/13> Zmax 1/13>  Interim History / Subjective:  Weaned to FIO2 40% on 20L. Speaking full sentences. Reports dyspnea on exertion though improved since admission. In good spirit, reports good appetite.   Objective   Blood pressure 134/73, pulse 77, temperature 97.8 F (36.6 C), temperature source Oral, resp. rate 13, height _0  (1.676 m), weight 80.8 kg, SpO2 98 %.    FiO2 (%):  [40 %-50 %] 40 %   Intake/Output Summary (Last 24 hours) at 11/20/2020 1026 Last data filed at 11/20/2020 0600 Gross per  24 hour  Intake 483 ml  Output 1950 ml  Net -1467 ml   Filed Weights   11/14/20 1229 11/18/20 1100  Weight: 72.6 kg 80.8 kg   Physical Exam: General: Well-appearing, no acute distress, well nourished  HENT: [place], AT, OP clear, MMM Eyes: EOMI, no scleral icterus Respiratory: Scattered wheezes bilaterally. Bibasilar crackles with squeaks.  Cardiovascular: RRR, -M/R/G, no JVD GI: BS+, soft, nontender Extremities:-Edema,-tenderness Neuro: AAO x4, CNII-XII grossly intact Skin: Intact, no rashes or bruising Psych: Normal mood, normal affect  Resolved Hospital Problem list      Assessment & Plan:  Acute on chronic resp failure with hypercabia and hypoxemia to ILD exacerbation - weaning O2 requirements.  - CTA neg for PE.  - Transition from heated high flow to HFNC - Complete antibiotics for CAP coverage x 5 d. End date 1/17 - Transition from solumedrol to prednisone 60 mg tomorrow. Taper pending clinical improvement - No diuresis today - Stop antibiotics.  - Palliative consulted. Appreciate assistance: Patient wishes to remain full code - Scheduled Pulmicort and Duonebs  Pulmonary hypertension demonstrated on RHC with moderate pulmonary hypertension.  - Meets PTH criteria for treprostinil initiation, but patient would need to be on less O2 (10Lpm) and more ambulatory (able to go 133min six minutes). Discussed with Drs RChase Callerand BenSimhon, will attempt to improve functional status prior to initiation.  - Will maximize ambulation and wean O2  HTN - PRN  hydralazine - Diuresis as above  Type 2 Diabetes mellitus - SSI - Added Levemir bid today (on NPH tomorrow)  RA - Will resume Imuran as not signs of infection.   Hypothyroid - Continue levothyroxine  Best practice (evaluated daily)  Diet: Full diet Pain/Anxiety/Delirium protocol (if indicated): n/a VAP protocol (if indicated): n/a DVT prophylaxis: sq hep GI prophylaxis: bowel regimen Glucose control: well controlled  on SSI and Levemir. Monitor during steroid taper.  Mobility: Progressive ambulation Disposition: Remain in ICU  Goals of Care:  Last date of multidisciplinary goals of care discussion: 1/15 Family and staff present: Palliative Care Team and patient Summary of discussion: Full code Follow up goals of care discussion due:  Code Status: FULL code, avoid et if possible  Labs   CBC: Recent Labs  Lab 11/14/20 1233 11/16/20 0100 11/17/20 0045 11/18/20 0435 11/18/20 1335 11/18/20 1340 11/19/20 0343 11/20/20 0431  WBC 8.4 12.5* 8.2 5.6  --   --  5.7 6.3  NEUTROABS 6.7  --   --   --   --   --   --   --   HGB 12.3* 10.7* 11.0* 10.5* 11.2*  11.6* 11.6* 10.5* 10.1*  HCT 40.7 35.3* 34.0* 33.8* 33.0*  34.0* 34.0* 33.6* 31.0*  MCV 105.7* 103.2* 99.1 99.4  --   --  99.7 97.8  PLT 240 210 227 185  --   --  199 097    Basic Metabolic Panel: Recent Labs  Lab 11/16/20 0100 11/17/20 0045 11/18/20 0435 11/18/20 1335 11/18/20 1340 11/19/20 0343 11/20/20 0431  NA 147* 145 141 143  141 141 138 138  K 4.6 3.7 3.3* 3.9  4.1 4.1 4.7 4.5  CL 110 107 102  --   --  102 103  CO2 _0 --   --  29 26  GLUCOSE 186* 210* 201*  --   --  221* 193*  BUN _1 --   --  17 17  CREATININE 0.89 0.85 0.87  --   --  0.79 0.82  CALCIUM 8.5* 8.4* 7.8*  --   --  7.8* 7.8*   GFR: Estimated Creatinine Clearance: 77.7 mL/min (by C-G formula based on SCr of 0.82 mg/dL). Recent Labs  Lab 11/14/20 1325 11/14/20 1527 11/14/20 1621 11/15/20 0605 11/16/20 0100 11/17/20 0045 11/18/20 0435 11/19/20 0343 11/20/20 0431  PROCALCITON  --   --  0.41 0.81 0.58  --   --   --   --   WBC  --   --   --   --  12.5* 8.2 5.6 5.7 6.3  LATICACIDVEN 3.7* 5.3*  --   --   --   --   --   --   --     Liver Function Tests: Recent Labs  Lab 11/14/20 1233  AST 25  ALT 14  ALKPHOS 36*  BILITOT 1.0  PROT 7.4  ALBUMIN 3.6   No results for input(s): LIPASE, AMYLASE in the last 168 hours. No results for  input(s): AMMONIA in the last 168 hours.  ABG    Component Value Date/Time   PHART 7.306 (L) 11/14/2020 1436   PCO2ART 47.6 11/14/2020 1436   PO2ART 77.3 (L) 11/14/2020 1436   HCO3 31.3 (H) 11/18/2020 1340   TCO2 33 (H) 11/18/2020 1340   ACIDBASEDEF 2.3 (H) 11/14/2020 1436   O2SAT 74.0 11/18/2020 1340     Coagulation Profile: Recent Labs  Lab 11/14/20 1233  INR 1.1  Cardiac Enzymes: No results for input(s): CKTOTAL, CKMB, CKMBINDEX, TROPONINI in the last 168 hours.  HbA1C: Hgb A1c MFr Bld  Date/Time Value Ref Range Status  11/14/2020 04:21 PM 7.1 (H) 4.8 - 5.6 % Final    Comment:    (NOTE) Pre diabetes:          5.7%-6.4%  Diabetes:              >6.4%  Glycemic control for   <7.0% adults with diabetes   12/15/2011 06:55 PM 7.8 (H) <5.7 % Final    Comment:    (NOTE)                                                                       According to the ADA Clinical Practice Recommendations for 2011, when HbA1c is used as a screening test:  >=6.5%   Diagnostic of Diabetes Mellitus           (if abnormal result is confirmed) 5.7-6.4%   Increased risk of developing Diabetes Mellitus References:Diagnosis and Classification of Diabetes Mellitus,Diabetes KFMM,0375,43(KGOVP 1):S62-S69 and Standards of Medical Care in         Diabetes - 2011,Diabetes CHEK,3524,81 (Suppl 1):S11-S61.    CBG: Recent Labs  Lab 11/19/20 0811 11/19/20 1213 11/19/20 1634 11/19/20 2153 11/20/20 0809  GLUCAP [place]    Kipp Brood, MD [facility] ICU Physician [place]  Pager: [phone] Mobile: [phone] After hours: [phone].

## 2020-11-20 NOTE — Telephone Encounter (Signed)
Hi [name]. I am the pharmacist for Dr. Clayborne Dana office. I do not work with oxygen machines. I am just working on the [employer]. Not sure who this should be routed to but needs to go to Dr. Golden Pop office.

## 2020-11-20 NOTE — Telephone Encounter (Signed)
Pt called from hospital concerned about his home concentrator- is has not been working properly. Order to DME for this to be looked at was placed. Nothing further needed.

## 2020-11-20 NOTE — Evaluation (Signed)
Occupational Therapy Evaluation Patient Details Name: [name] MRN: [id number] DOB: [birth year] Today's Date: 11/20/2020    History of Present Illness 75 yo male admitted due to Chronic fibrosing intersitial lung disease exacerbation with pulmonary HTN PMH  COPD, DM HTN thyroid disease remote smoker   Clinical Impression   Patient evaluated by Occupational Therapy with no further acute OT needs identified. All education has been completed and the patient has no further questions. See below for any follow-up Occupational Therapy or equipment needs. OT to sign off. Thank you for referral.   Noted to have 10L HFNC 30% with 120 HR 98% decreased 85% with pursed lip breathing rebound x3 minutes     Follow Up Recommendations  No OT follow up    Equipment Recommendations  None recommended by OT    Recommendations for Other Services       Precautions / Restrictions Precautions Precaution Comments: oxygen      Mobility Bed Mobility               General bed mobility comments: oob in chair    Transfers Overall transfer level: Modified independent                    Balance Overall balance assessment: Independent                                         ADL either performed or assessed with clinical judgement   ADL Overall ADL's : Modified independent                                       General ADL Comments: demonstrates LB dressing with figure 4 crossing, toilet transfer, sink level grooming and chair transfer. Pt able to manage lines. educated on energy conservation handout     Vision Baseline Vision/History: No visual deficits       Perception     Praxis      Pertinent Vitals/Pain Pain Assessment: No/denies pain     Hand Dominance Right   Extremity/Trunk Assessment Upper Extremity Assessment Upper Extremity Assessment: Overall WFL for tasks assessed   Lower Extremity Assessment Lower Extremity  Assessment: Overall WFL for tasks assessed   Cervical / Trunk Assessment Cervical / Trunk Assessment: Normal   Communication Communication Communication: No difficulties   Cognition Arousal/Alertness: Awake/alert Behavior During Therapy: WFL for tasks assessed/performed Overall Cognitive Status: Within Functional Limits for tasks assessed                                     General Comments  HFNC 30% 10 L BP 141/72 (93)    Exercises     Shoulder Instructions      Home Living Family/patient expects to be discharged to:: Private residence Living Arrangements: Spouse/significant other;Other (Comment) (step daughter and aunt) Available Help at Discharge: Family Type of Home: Mobile home (double wide) Home Access: Ramped entrance     Home Layout: One level     Bathroom Shower/Tub: Occupational psychologist: Standard     Home Equipment: Bedside commode;Walker - 2 wheels;Cane - quad (oxygen with 4L 100 ft (50x2 ))   Additional Comments: dogx2 (got them 7-8 months)  Prior Functioning/Environment Level of Independence: Independent        Comments: driving, retired        OT Problem List: Cardiopulmonary status limiting activity      OT Treatment/Interventions:      OT Goals(Current goals can be found in the care plan section) Acute Rehab OT Goals Patient Stated Goal: to return home OT Goal Formulation: All assessment and education complete, DC therapy Potential to Achieve Goals: Good  OT Frequency:     Barriers to D/C:            Co-evaluation              AM-PAC OT "6 Clicks" Daily Activity     Outcome Measure Help from another person eating meals?: None Help from another person taking care of personal grooming?: None Help from another person toileting, which includes using toliet, bedpan, or urinal?: None Help from another person bathing (including washing, rinsing, drying)?: None Help from another person to put on  and taking off regular upper body clothing?: None Help from another person to put on and taking off regular lower body clothing?: None 6 Click Score: 24   End of Session Equipment Utilized During Treatment: Oxygen Nurse Communication: Mobility status;Precautions  Activity Tolerance: Patient tolerated treatment well Patient left: in chair;with call bell/phone within reach  OT Visit Diagnosis: Muscle weakness (generalized) (M62.81)                Time: 2229-7989 OT Time Calculation (min): 17 min Charges:  OT General Charges $OT Visit: 1 Visit OT Evaluation $OT Eval Low Complexity: 1 Low   Brynn, OTR/L  Acute Rehabilitation Services Pager: [phone] Office: [phone] .   Jeri Modena 11/20/2020, 3:09 PM

## 2020-11-21 DIAGNOSIS — Z7189 Other specified counseling: Secondary | ICD-10-CM

## 2020-11-21 LAB — CBC
HCT: 31.7 % — ABNORMAL LOW (ref 39.0–52.0)
Hemoglobin: 10.4 g/dL — ABNORMAL LOW (ref 13.0–17.0)
MCH: 32.2 pg (ref 26.0–34.0)
MCHC: 32.8 g/dL (ref 30.0–36.0)
MCV: 98.1 fL (ref 80.0–100.0)
Platelets: 207 10*3/uL (ref 150–400)
RBC: 3.23 MIL/uL — ABNORMAL LOW (ref 4.22–5.81)
RDW: 15.7 % — ABNORMAL HIGH (ref 11.5–15.5)
WBC: 7 10*3/uL (ref 4.0–10.5)
nRBC: 0.3 % — ABNORMAL HIGH (ref 0.0–0.2)

## 2020-11-21 LAB — GLUCOSE, CAPILLARY
Glucose-Capillary: 111 mg/dL — ABNORMAL HIGH (ref 70–99)
Glucose-Capillary: 349 mg/dL — ABNORMAL HIGH (ref 70–99)
Glucose-Capillary: 180 mg/dL — ABNORMAL HIGH (ref 70–99)

## 2020-11-21 MED ORDER — INSULIN ASPART 100 UNIT/ML ~~LOC~~ SOLN
4.0000 [IU] | Freq: Three times a day (TID) | SUBCUTANEOUS | Status: DC
  Administered 2020-11-21: 4 [IU] via SUBCUTANEOUS

## 2020-11-21 MED ORDER — IPRATROPIUM-ALBUTEROL 0.5-2.5 (3) MG/3ML IN SOLN
3.0000 mL | Freq: Two times a day (BID) | RESPIRATORY_TRACT | Status: DC
  Administered 2020-11-21 – 2020-11-22 (×2): 3 mL via RESPIRATORY_TRACT
  Filled 2020-11-21 (×2): qty 3

## 2020-11-21 MED ORDER — IPRATROPIUM-ALBUTEROL 0.5-2.5 (3) MG/3ML IN SOLN
3.0000 mL | Freq: Three times a day (TID) | RESPIRATORY_TRACT | Status: DC
  Administered 2020-11-21: 3 mL via RESPIRATORY_TRACT
  Filled 2020-11-21: qty 3

## 2020-11-21 NOTE — Progress Notes (Signed)
Inpatient Diabetes Program Recommendations  AACE/ADA: New Consensus Statement on Inpatient Glycemic Control   Target Ranges:  Prepandial:   less than 140 mg/dL      Peak postprandial:   less than 180 mg/dL (1-2 hours)      Critically ill patients:  140 - 180 mg/dL   Results for [name], [name] (MRN [id number]) as of 11/21/2020 08:24  Ref. Range 11/20/2020 08:09 11/20/2020 12:07 11/20/2020 15:58 11/20/2020 21:50 11/21/2020 06:49  Glucose-Capillary Latest Ref Range: 70 - 99 mg/dL 142 (H) 191 (H) 372 (H) 239 (H) 111 (H)   Review of Glycemic Control  Current orders for Inpatient glycemic control: Levemir 10 units BID, Novolog 0-15 units TID with meals; Prednisone 60 mg QAM  Inpatient Diabetes Program Recommendations:    Insulin: If steroids are continued, please consider ordering Novolog 4 units TID with meals for meal coverage if patient eats at least 50% of meals.  Thanks, Barnie Alderman, RN, MSN, CDE Diabetes Coordinator Inpatient Diabetes Program [phone] (Team Pager from 8am to 5pm)

## 2020-11-21 NOTE — Care Management Important Message (Signed)
Important Message  Patient Details  Name: [name] MRN: [id number] Date of Birth: Nov 25, 1945   Medicare Important Message Given:  Yes     Shelda Altes 11/21/2020, 12:50 PM

## 2020-11-21 NOTE — Progress Notes (Signed)
Physical Therapy Treatment Patient Details Name: [name] MRN: [id number] DOB: Nov 27, 1945 Today's Date: 11/21/2020    History of Present Illness 75 yo male admitted due to Chronic fibrosing intersitial lung disease exacerbation with pulmonary HTN PMH  COPD, DM HTN thyroid disease remote smoker    PT Comments    Pt was seen for progression of mobility and was down to 4L O2 in the room.  Had to increase to 8L for gait due to desaturating during the trip to BR with O2 monitor being a bit loose and recording 77%.  Recovered to 100% sitting but had to increase O2 as well.  Pt results were shared with nursing and reported pt is likely not that low but did desaturate for need to use more O2.  Follow along with him to increase distance with an eye for his need to titrate O2 for sat maintenance.  Follow Up Recommendations  No PT follow up     Equipment Recommendations  None recommended by PT    Recommendations for Other Services       Precautions / Restrictions Precautions Precautions: Fall Precaution Comments: monitor O2 sats Restrictions Weight Bearing Restrictions: No    Mobility  Bed Mobility Overal bed mobility: Needs Assistance Bed Mobility: Supine to Sit;Sit to Supine     Supine to sit: Min assist Sit to supine: Min assist      Transfers Overall transfer level: Modified independent                  Ambulation/Gait Ambulation/Gait assistance: Supervision Gait Distance (Feet): 80 Feet Assistive device: None Gait Pattern/deviations: Step-through pattern Gait velocity: reduced   General Gait Details: requires 8L O2 to maintain sats in 90+% range   Stairs             Wheelchair Mobility    Modified Rankin (Stroke Patients Only)       Balance Overall balance assessment: Independent                                          Cognition Arousal/Alertness: Awake/alert Behavior During Therapy: WFL for tasks  assessed/performed Overall Cognitive Status: Within Functional Limits for tasks assessed                                        Exercises      General Comments General comments (skin integrity, edema, etc.): pt dropped to 77% on sats while getting up to BR and then back to bed but was having a problem keeping his O2 monitor on his hand      Pertinent Vitals/Pain Pain Assessment: No/denies pain    Home Living                      Prior Function            PT Goals (current goals can now be found in the care plan section) Acute Rehab PT Goals Patient Stated Goal: to return home Progress towards PT goals: Progressing toward goals    Frequency    Min 3X/week      PT Plan Current plan remains appropriate    Co-evaluation              AM-PAC PT "6 Clicks" Mobility   Outcome Measure  Help needed turning from your back to your side while in a flat bed without using bedrails?: None Help needed moving from lying on your back to sitting on the side of a flat bed without using bedrails?: None Help needed moving to and from a bed to a chair (including a wheelchair)?: None Help needed standing up from a chair using your arms (e.g., wheelchair or bedside chair)?: None Help needed to walk in hospital room?: A Little Help needed climbing 3-5 steps with a railing? : A Little 6 Click Score: 22    End of Session Equipment Utilized During Treatment: Oxygen Activity Tolerance: Patient tolerated treatment well Patient left: with call bell/phone within reach;in bed;with bed alarm set Nurse Communication: Mobility status PT Visit Diagnosis: Difficulty in walking, not elsewhere classified (R26.2)     Time: 5732-2567 PT Time Calculation (min) (ACUTE ONLY): 23 min  Charges:  $Gait Training: 8-22 mins $Therapeutic Activity: 8-22 mins                   Ramond Dial 11/21/2020, 9:54 PM  Mee Hives, PT MS Acute Rehab Dept. Number: [place] [facility] [place]

## 2020-11-21 NOTE — Progress Notes (Addendum)
Palliative: Mr. Dunshee is sitting up quietly in bed watching TV.  He greets me making and somewhat keeping eye contact.  [name] alert and oriented x3, able to make his needs known.  There is no family at bedside at this time.  We talk about his acute and chronic health concerns, his lung issues.  He tells me that he was hospitalized at this time because his oxygen equipment failed, and his provider was unable to meet his needs.  He tells me that he is active at home, and he feels that he has no needs for services other than his oxygen.  He tells me that he follows up with pulmonologist outpatient.  Overall, Mr. Hosea seems knowledgeable about his disease process.  When I mention that we are working to have him qualified for a medicine for his pulmonary hypertension he is able to share with me where we are in the process.  We talked about CODE STATUS, chest compressions and intubation.  I share my worry about his lung disease and life support.  I asked him to consider, if we do place him on life support, for how long?  2 days, 2 weeks, forever?  Mr. Mcguffin states, "you cannot do it forever".  I share that he is correct.  I share that after 2 weeks most people require tracheostomy.  We talk about meaningful improvement.  I encourage Mr. Trostle to consider his choices, and share these choices with his family.  Conference with attending, bedside nursing staff, transition of care team related to patient condition, needs, goals of care.  Plan:   At this point continue full scope/full code.  No at home needs identified other than oxygen.  Addendum: Mr. Tamburo would benefit from continued goals of care/CODE STATUS discussion with his trusted pulmonologist, Dr. Chase Caller, who he has seen since 2013.  25 minutes Quinn Axe, NP Palliative medicine team Team phone [phone] Greater than 50% of this time was spent counseling and coordinating care related to the above assessment and plan.

## 2020-11-22 DIAGNOSIS — J9622 Acute and chronic respiratory failure with hypercapnia: Secondary | ICD-10-CM | POA: Diagnosis not present

## 2020-11-22 DIAGNOSIS — I2721 Secondary pulmonary arterial hypertension: Secondary | ICD-10-CM | POA: Diagnosis not present

## 2020-11-22 DIAGNOSIS — J9621 Acute and chronic respiratory failure with hypoxia: Secondary | ICD-10-CM | POA: Diagnosis not present

## 2020-11-22 LAB — C-REACTIVE PROTEIN: CRP: 1.3 mg/dL — ABNORMAL HIGH (ref <1.0)

## 2020-11-22 LAB — GLUCOSE, CAPILLARY
Glucose-Capillary: 124 mg/dL — ABNORMAL HIGH (ref 70–99)
Glucose-Capillary: 178 mg/dL — ABNORMAL HIGH (ref 70–99)

## 2020-11-22 LAB — SEDIMENTATION RATE: Sed Rate: 25 mm/hr — ABNORMAL HIGH (ref 0–16)

## 2020-11-22 LAB — CREATININE, SERUM
Creatinine, Ser: 0.73 mg/dL (ref 0.61–1.24)
GFR, Estimated: >60 mL/min (ref >60)

## 2020-11-22 MED ORDER — INSULIN ASPART 100 UNIT/ML ~~LOC~~ SOLN
0.0000 [IU] | Freq: Three times a day (TID) | SUBCUTANEOUS | Status: DC
  Administered 2020-11-22: 2 [IU] via SUBCUTANEOUS
  Administered 2020-11-22: 3 [IU] via SUBCUTANEOUS

## 2020-11-22 MED ORDER — GLIPIZIDE ER 2.5 MG PO TB24: 2.5000 mg | ORAL_TABLET | Freq: Every day | ORAL | 0 refills | Status: DC

## 2020-11-22 MED ORDER — PREDNISONE 20 MG PO TABS: 60.0000 mg | ORAL_TABLET | Freq: Every day | ORAL | 0 refills | Status: AC

## 2020-11-22 MED ORDER — INSULIN ASPART 100 UNIT/ML ~~LOC~~ SOLN
4.0000 [IU] | Freq: Three times a day (TID) | SUBCUTANEOUS | Status: DC
  Administered 2020-11-22 (×2): 4 [IU] via SUBCUTANEOUS

## 2020-11-22 NOTE — Plan of Care (Signed)
  Problem: Education: Goal: Knowledge of General Education information will improve Description: Including pain rating scale, medication(s)/side effects and non-pharmacologic comfort measures Outcome: Progressing

## 2020-11-22 NOTE — Progress Notes (Signed)
NAME:  [name], MRN:  [id number], DOB:  [birth year], LOS: 8 ADMISSION DATE:  11/14/2020, CONSULTATION DATE:  1/13 REFERRING MD:  EDP/ Zammit, CHIEF COMPLAINT:  resp distress   Brief History:  74 yobm remote smoker > 6 m since [place] with RA/ PF 02 dep at baseline  f/b Ramaswamy/ [facility]man brought in by ambulance with sats intially 48% on 4lpm though improved on bipap and PCCM services asked to consult pm 1/13. Admitted to ICU for ILD exacerbation   History of Present Illness:  Unable to obtain hx due to resp extremis - per Ramaswamy's office note he is very stoic and typically does not give reliable symptom hx even when seen in office but overall pattern has been one of progressive dz and intolerance to multiple PF /copd meds   Past Medical History:  Arthritis RA with ILD   COPD on list  though most of pfts are restrictive with no curvature on f/v loop Diabetes mellitus Hypertension Pneumonia Thyroid disease    Significant Hospital Events:  1/13- Placed on bipap in ER initially RHC 1/17 - moderate pulmonary hypertension with PAD 18, PCWP 10, PVR 3.1 WU  Consults:  PCCM  Procedures:    Significant Diagnostic Tests:  CTA 11/16/19 Bilateral patchy ground galss opacities/consolidation superimposed on chronic ILD. Honeycombing.   Micro Data:  Resp viral panel/ PCR  1/13 for covid / flu both neg MRSA  PCR  1/13>>> BC x 1  1/13  >>> Staphylococcus capitis, significance of one + bottle unknown   Antimicrobials:  Vancomycin 1/13>1/14 Rocephin 1/13>1/17 Zmax 1/13>1/17   Interim History / Subjective:   Patient reports feeling well this morning. Denies any complaints. States his breathing feels fine. He ambulated down the hall yesterday and did fine. Saturating well on 4L [place].   Objective   Blood pressure 132/62, pulse 81, temperature 97.8 F (36.6 C), temperature source Oral, resp. rate (!) 25, height _0  (1.676 m), weight 80.3 kg, SpO2 100 %.        Intake/Output Summary  (Last 24 hours) at 11/22/2020 0931 Last data filed at 11/22/2020 0913 Gross per 24 hour  Intake 600 ml  Output 1525 ml  Net -925 ml   Filed Weights   11/14/20 1229 11/18/20 1100 11/21/20 0450  Weight: 72.6 kg 80.8 kg 80.3 kg   Physical Exam: General: Well-appearing, no acute distress, well nourished  Eyes: EOMI, no scleral icterus Respiratory: Scattered wheezes bilaterally. Bibasilar crackles with squeaks.  Cardiovascular: RRR, no murmurs  GI: BS+, soft, nontender Extremities: no edema, no tenderness, warm and dry Neuro: alert and oriented x3 Skin: Intact, no rashes or bruising Psych: Normal mood, normal affect  Resolved Hospital Problem list      Assessment & Plan:   Acute on chronic resp failure with hypercabia and hypoxemia to ILD exacerbation Initially required BiPAP, now on 4L [place] saturating 100%. Home oxygen requirement he reports is 4L. Completed 5 day course of antibiotics for CAP coverage. On 60 mg prednisone, will need to taper.  - check pulse ox with ambulation today, patient is on baseline supplement oxygen of 4L - continue prednisone 60 mg, will need to taper - scheduled Pulmicort and Duonebs  Pulmonary hypertension demonstrated on RHC with moderate pulmonary hypertension - Meets PTH criteria for treprostinil initiation, but patient would need to be on less O2 (10Lpm) and more ambulatory (able to go 157min six minutes). Discussed with Drs RChase Callerand BenSimhon, will attempt to improve functional status prior to initiation.  -  Will maximize ambulation and wean O2  HTN - Continue amlodipine 10 mg  - PRN hydralazine and metoprolol  Type 2 Diabetes mellitus - SSI - Levemir 10 units BID  - Novolog 4 units TID WC  RA - Continue imuran  Hypothyroid - Continue levothyroxine  Best practice (evaluated daily)  Diet: Full diet Pain/Anxiety/Delirium protocol (if indicated): n/a VAP protocol (if indicated): n/a DVT prophylaxis: sq hep GI prophylaxis: bowel  regimen Glucose control: well controlled on SSI and Levemir. Monitor during steroid taper.  Mobility: Progressive ambulation Disposition: discharge home soon, pending oxygen requirement  Goals of Care:  Last date of multidisciplinary goals of care discussion: 1/15 Family and staff present: Palliative Care Team and patient Summary of discussion: Full code Follow up goals of care discussion due: n/a Code Status: FULL code, avoid et if possible  Labs   CBC: Recent Labs  Lab 11/17/20 0045 11/18/20 0435 11/18/20 1335 11/18/20 1340 11/19/20 0343 11/20/20 0431 11/21/20 0500  WBC 8.2 5.6  --   --  5.7 6.3 7.0  HGB 11.0* 10.5* 11.2*  11.6* 11.6* 10.5* 10.1* 10.4*  HCT 34.0* 33.8* 33.0*  34.0* 34.0* 33.6* 31.0* 31.7*  MCV 99.1 99.4  --   --  99.7 97.8 98.1  PLT 227 185  --   --  199 201 712    Basic Metabolic Panel: Recent Labs  Lab 11/16/20 0100 11/17/20 0045 11/18/20 0435 11/18/20 1335 11/18/20 1340 11/19/20 0343 11/20/20 0431 11/22/20 0459  NA 147* 145 141 143  141 141 138 138  --   K 4.6 3.7 3.3* 3.9  4.1 4.1 4.7 4.5  --   CL 110 107 102  --   --  102 103  --   CO2 _0 --   --  29 26  --   GLUCOSE 186* 210* 201*  --   --  221* 193*  --   BUN _1 --   --  17 17  --   CREATININE 0.89 0.85 0.87  --   --  0.79 0.82 0.73  CALCIUM 8.5* 8.4* 7.8*  --   --  7.8* 7.8*  --    GFR: Estimated Creatinine Clearance: 79.4 mL/min (by C-G formula based on SCr of 0.73 mg/dL). Recent Labs  Lab 11/16/20 0100 11/17/20 0045 11/18/20 0435 11/19/20 0343 11/20/20 0431 11/21/20 0500  PROCALCITON 0.58  --   --   --   --   --   WBC 12.5*   < > 5.6 5.7 6.3 7.0   < > = values in this interval not displayed.    Liver Function Tests: No results for input(s): AST, ALT, ALKPHOS, BILITOT, PROT, ALBUMIN in the last 168 hours. No results for input(s): LIPASE, AMYLASE in the last 168 hours. No results for input(s): AMMONIA in the last 168 hours.  ABG    Component Value  Date/Time   PHART 7.306 (L) 11/14/2020 1436   PCO2ART 47.6 11/14/2020 1436   PO2ART 77.3 (L) 11/14/2020 1436   HCO3 31.3 (H) 11/18/2020 1340   TCO2 33 (H) 11/18/2020 1340   ACIDBASEDEF 2.3 (H) 11/14/2020 1436   O2SAT 74.0 11/18/2020 1340     Coagulation Profile: No results for input(s): INR, PROTIME in the last 168 hours.  Cardiac Enzymes: No results for input(s): CKTOTAL, CKMB, CKMBINDEX, TROPONINI in the last 168 hours.  HbA1C: Hgb A1c MFr Bld  Date/Time Value Ref Range Status  11/14/2020 04:21 PM 7.1 (H) 4.8 - 5.6 %  Final    Comment:    (NOTE) Pre diabetes:          5.7%-6.4%  Diabetes:              >6.4%  Glycemic control for   <7.0% adults with diabetes   12/15/2011 06:55 PM 7.8 (H) <5.7 % Final    Comment:    (NOTE)                                                                       According to the ADA Clinical Practice Recommendations for 2011, when HbA1c is used as a screening test:  >=6.5%   Diagnostic of Diabetes Mellitus           (if abnormal result is confirmed) 5.7-6.4%   Increased risk of developing Diabetes Mellitus References:Diagnosis and Classification of Diabetes Mellitus,Diabetes ZVPN,7682,35(ZPFOP 1):S62-S69 and Standards of Medical Care in         Diabetes - 2011,Diabetes TVJG,5692,69 (Suppl 1):S11-S61.    CBG: Recent Labs  Lab 11/20/20 2150 11/21/20 0649 11/21/20 1721 11/21/20 2126 11/22/20 0527  GLUCAP 239* 111* 349* 180* 124*    Areeg Rehman, DO PGY-2 IM

## 2020-11-22 NOTE — Progress Notes (Signed)
Inpatient Diabetes Program Recommendations  AACE/ADA: New Consensus Statement on Inpatient Glycemic Control (2015)  Target Ranges:  Prepandial:   less than 140 mg/dL      Peak postprandial:   less than 180 mg/dL (1-2 hours)      Critically ill patients:  140 - 180 mg/dL   Lab Results  Component Value Date   GLUCAP 178 (H) 11/22/2020   HGBA1C 7.1 (H) 11/14/2020    Review of Glycemic Control Results for ABDON, PETROSKY (MRN [id number]) as of 11/22/2020 15:15  Ref. Range 11/21/2020 21:26 11/22/2020 05:27 11/22/2020 11:10  Glucose-Capillary Latest Ref Range: 70 - 99 mg/dL 180 (H) 124 (H) 178 (H)   Diabetes history: Type 2 DM Outpatient Diabetes medications: NPH 10 units QAM/ 20 units QPM, Metformin 1000 mg BID Current orders for Inpatient glycemic control: Novolog 4 units TID, Levemir 10 units BID, Novolog 0-15 units TID  Inpatient Diabetes Program Recommendations:    Paged regarding disposition for patient with ILD to be placed on steroid taper of Prednisone 60 mg QD for 10 days post discharge.   Looking at current trends, home medications and patient's age, recommending continuing with home regimen. Left message with MD.  Damaris Schooner with patient to review diabetes management. Verified home medications. Patient inconsistent with eating and typically consumes most food in the late afternoon hence his afternoon dose of NPH is higher.  Reviewed patient's current A1c of 7.1%. Explained what a A1c is and what it measures. Also reviewed goal A1c with patient, importance of good glucose control @ home, and blood sugar goals. Reviewed patho of dm, need for insulin, impact of steroids, hypoglycemia, interventions, vascular changes and commorbidities.  Patient is aware of impact of steroids to glucose trends. Patient checks CBGs 2 times per day with injections. Encouraged to check a third time in the middle of the day. Reviewed when to call MD and patient aware of follow up with pulmonary.  Has no further  questions at this time.   Thanks, Bronson Curb, MSN, RNC-OB Diabetes Coordinator [phone] (8a-5p)

## 2020-11-22 NOTE — Consult Note (Signed)
   [facility] CM Inpatient Consult   11/22/2020  [name] 1946/05/06 [id number]   [facility] [ACO] Patient: [name]   Patient screened for length of stay hospitalization to check if potential [place] Management service needs, patient with a history of THN in the past.  Review of patient's medical record of palliative consult and progress notes reveal patient's disposition is currently for home with wife.  Primary Care Provider is Merrilee Seashore, MD this provider is listed to provide the transition of care [TOC] for post hospital follow up.  Plan: Call patient's hospital room without success, notified and follow up with inpatient [facility] RNCM for any transition of care needs for community follow up. Continue to follow progress and disposition to assess for post hospital care management needs.    Please place a [facility] Care Management consult as appropriate and for questions contact:   Natividad Brood, RN BSN [facility] Liaison  [phone] business mobile phone Toll free office [phone]  Fax number: [phone] [email or web address]

## 2020-11-22 NOTE — Discharge Summary (Addendum)
Name: [name] MRN: [id number] DOB: [birth year] 75 y.o. PCP: Merrilee Seashore, MD  Date of Admission: 11/14/2020 12:25 PM Date of Discharge: 11/22/2020 Attending Physician: Kipp Brood, MD  Discharge Diagnosis: 1. ILD exacerbation  2. Acute on chronic respiratory failure with hypercarbia and hypoxemia  3. CAP 4. Pulmonary HTN  Discharge Medications: Allergies as of 11/22/2020      Reactions   Bactrim [sulfamethoxazole-trimethoprim] Shortness Of Breath   Patient states "got short winded"   Pirfenidone Rash   Spiriva Respimat [tiotropium Bromide Monohydrate] Rash   Ofev [nintedanib]       Medication List    TAKE these medications   acetaminophen 500 MG tablet Commonly known as: TYLENOL Take 1,000 mg by mouth every 6 (six) hours as needed for moderate pain or headache.   albuterol 108 (90 Base) MCG/ACT inhaler Commonly known as: VENTOLIN HFA Inhale 2 puffs into the lungs every 6 (six) hours as needed.   amLODipine 10 MG tablet Commonly known as: NORVASC Take 10 mg by mouth daily.   aspirin 81 MG tablet Take 81 mg by mouth daily.   atorvastatin 20 MG tablet Commonly known as: LIPITOR Take 20 mg by mouth daily.   azaTHIOprine 50 MG tablet Commonly known as: IMURAN Take 100 mg by mouth daily.   benzonatate 100 MG capsule Commonly known as: TESSALON Take 2 capsules (200 mg total) by mouth 3 (three) times daily as needed for cough.   fluticasone 50 MCG/ACT nasal spray Commonly known as: FLONASE Place 2 sprays into both nostrils daily.   folic acid 712 MCG tablet Commonly known as: FOLVITE Take 400 mcg by mouth daily.   glipiZIDE 2.5 MG 24 hr tablet Commonly known as: GLUCOTROL XL Take 1 tablet (2.5 mg total) by mouth daily with breakfast.   insulin NPH Human 100 UNIT/ML injection Commonly known as: NOVOLIN N Inject 12 Units into the skin See admin instructions. Inject 10 units every morning and 20 units every evening   INSULIN SYRINGE  .3CC/31GX5/16" 31G X 5/16" 0.3 ML Misc Inject 1 Syringe into the skin as directed.   levothyroxine 25 MCG tablet Commonly known as: SYNTHROID Take 25 mcg by mouth daily.   metFORMIN 500 MG 24 hr tablet Commonly known as: GLUCOPHAGE-XR Take 2 tablets (1,000 mg total) by mouth 2 (two) times daily.   ondansetron 4 MG tablet Commonly known as: Zofran Take 1 tablet (4 mg total) by mouth 3 (three) times daily as needed for nausea or vomiting.   pantoprazole 40 MG tablet Commonly known as: PROTONIX Take 40 mg by mouth daily.   predniSONE 20 MG tablet Commonly known as: DELTASONE Take 3 tablets (60 mg total) by mouth daily with breakfast for 14 days. Start taking on: November 23, 2020 What changed:   medication strength  how much to take  when to take this   Spiriva Respimat 2.5 MCG/ACT Aers Generic drug: Tiotropium Bromide Monohydrate Inhale 2 puffs into the lungs daily.       Disposition and follow-up:   [name] was discharged from [facility] in Stable condition.  At the hospital follow up visit please address:  1.  ILD exacerbation- patient discharged on 60 mg prednisone for an additional 10 days, will need to taper. Also started on glipizide 2.5 mg to help control blood glucose  2.  Labs / imaging needed at time of follow-up: none  3.  Pending labs/ test needing follow-up: none  Follow-up Appointments: Follow up with Dr.  [facility] 12/03/20  Hospital Course by problem list: 1. ILD exacerbation  2. Acute on chronic respiratory failure with hypercarbia and hypoxemia  3. CAP 4. Pulmonary HTN  Patient presented via EMS with saturations initially at 48% on 4Lpm. He improved significantly on BiPAP and admitted to the ICU for an ILD exacerbation. CTA was negative for PE and negative covid test. CTA did show bilateral patchy ground glass opacities/consoladtion superimposed on chronic ILD He was treated with a 5 day course of antibiotics for CAP, with  rocephin and azithromycin. Started on solumedrol transitioned to prednisone 60 mg and treated with scheduled pulmicort and duonebs. Discharged home on 4L [place].   Pulmonary HTN was demonstrated on RHC (1/17) with moderate pulmonary hypertension. Meets PTH criteria for treprostinil initiation, but patient would need to be on less O2 (10Lpm) and more ambulatory (able to go 139min six minutes). Discussed with Drs RChase Callerand BenSimhon, will attempt to improve functional status prior to initiation.   Discharge Vitals:   BP (!) 147/63   Pulse 85   Temp 97.8 F (36.6 C) (Oral)   Resp (!) 23   Ht _0  (1.676 m)   Wt 76.9 kg   SpO2 100%   BMI 27.37 kg/m   Pertinent Labs, Studies, and Procedures:   CBC Latest Ref Rng & Units 11/21/2020 11/20/2020 11/19/2020  WBC 4.0 - 10.5 K/uL 7.0 6.3 5.7  Hemoglobin 13.0 - 17.0 g/dL 10.4(L) 10.1(L) 10.5(L)  Hematocrit 39.0 - 52.0 % 31.7(L) 31.0(L) 33.6(L)  Platelets 150 - 400 K/uL 207 201 199    BMP Latest Ref Rng & Units 11/22/2020 11/20/2020 11/19/2020  Glucose 70 - 99 mg/dL - 193(H) 221(H)  BUN 8 - 23 mg/dL - 17 17  Creatinine 0.61 - 1.24 mg/dL 0.73 0.82 0.79  BUN/Creat Ratio 10 - 24 - - -  Sodium 135 - 145 mmol/L - 138 138  Potassium 3.5 - 5.1 mmol/L - 4.5 4.7  Chloride 98 - 111 mmol/L - 103 102  CO2 22 - 32 mmol/L - 26 29  Calcium 8.9 - 10.3 mg/dL - 7.8(L) 7.8(L)     CT ANGIO CHEST PE W OR WO CONTRAST  Result Date: 11/15/2020 CLINICAL DATA:  Inpatient.  COPD.  Hypoxia. EXAM: CT ANGIOGRAPHY CHEST WITH CONTRAST TECHNIQUE: Multidetector CT imaging of the chest was performed using the standard protocol during bolus administration of intravenous contrast. Multiplanar CT image reconstructions and MIPs were obtained to evaluate the vascular anatomy. CONTRAST:  713mOMNIPAQUE IOHEXOL 350 MG/ML SOLN COMPARISON:  Chest radiograph from one day prior. 02/17/2020 chest CT angiogram. FINDINGS: Cardiovascular: The study is low-to-moderate quality for the evaluation  of pulmonary embolism, with significant motion degradation. There are no convincing filling defects in the central, lobar, segmental or subsegmental pulmonary artery branches to suggest acute pulmonary embolism. Atherosclerotic nonaneurysmal thoracic aorta. Normal caliber pulmonary arteries. Normal heart size. No significant pericardial fluid/thickening. Left anterior descending coronary atherosclerosis. Mediastinum/Nodes: No discrete thyroid nodules. Unremarkable esophagus. No axillary adenopathy. Mild right paratracheal adenopathy up to 1.4 cm (series 7/image 151), previously 1.3 cm, not substantially changed. No hilar adenopathy. Lungs/Pleura: No pneumothorax. Trace dependent left pleural effusion. No right pleural effusion. Extensive new patchy ground-glass opacity and consolidation throughout both lungs superimposed on chronic patchy peripheral reticulation and moderate traction bronchiectasis with associated extensive honeycombing and architectural distortion involving the right greater than left lungs. No lung masses or significant pulmonary nodules. Upper abdomen: No acute abnormality. Musculoskeletal: No aggressive appearing focal osseous lesions. Moderate thoracic spondylosis. Review  of the MIP images confirms the above findings. IMPRESSION: 1. Limited motion degraded scan. No convincing evidence of pulmonary embolism. 2. Extensive new patchy ground-glass opacity and consolidation throughout both lungs superimposed on chronic fibrotic interstitial lung disease with extensive honeycombing. Findings suggest multilobar pneumonia superimposed on previously documented UIP pattern interstitial lung disease. 3. Trace dependent left pleural effusion. 4. Stable mild mediastinal lymphadenopathy, most compatible with benign reactive etiology. 5. One vessel coronary atherosclerosis. 6. Aortic Atherosclerosis (ICD10-I70.0). Electronically Signed   By: Ilona Sorrel [name]   On: 11/15/2020 12:29   CARDIAC  CATHETERIZATION  Result Date: 11/18/2020 Findings: RA = 5 RV = 56/6 PA = 59/18 (34) PCW = 10 Fick cardiac output/index = 7.7/4.2 PVR = 3.1 Ao sat = 99% PA sat = 77%, 77% High SVC sat = 74% Assessment: 1. Mild to moderate pulmonary HTN with high cardiac output Plan/Discussion: Given PAH in setting of IPF, a trial of inhaled treprostinil seems reasonable. Glori Bickers, MD 2:25 PM   DG Chest Port 1 View  Result Date: 11/14/2020 CLINICAL DATA:  Shortness of breath, COPD, pulmonary fibrosis, pending COVID-19 test EXAM: PORTABLE CHEST 1 VIEW COMPARISON:  Portable exam 6962 hours compared to 02/17/2020 FINDINGS: Normal heart size, mediastinal contours, and pulmonary vascularity. Extensive chronic interstitial changes throughout both lungs, least severe in the LEFT upper lobe. Additional new infiltrates in the mid to lower lungs bilaterally favoring superimposed acute pneumonia, less likely aspiration. No pleural effusion or pneumothorax. Mild elevation of RIGHT diaphragm. Bones demineralized. IMPRESSION: Pulmonary fibrosis with superimposed acute infiltrates in the mid to lower lungs favor pneumonia, including COVID-19, less likely aspiration. Electronically Signed   By: Lavonia Dana [name]   On: 11/14/2020 13:04   ECHOCARDIOGRAM COMPLETE  Result Date: 11/15/2020    ECHOCARDIOGRAM REPORT   Patient Name:   BRAISON Jean Date of Exam: 11/15/2020 Medical Rec #:  [id number]    Height:       66.0 in Accession #:    [id number]   Weight:       160.0 lb Date of Birth:  [birth year]    BSA:          1.819 m Patient Age:    57 years     BP:           130/81 mmHg Patient Gender: M            HR:           106 bpm. Exam Location:  Inpatient Procedure: 2D Echo, Cardiac Doppler and Color Doppler Indications:    I27.20 Pulmonary Hypertension  History:        Patient has prior history of Echocardiogram examinations, most                 recent 06/25/2020. COPD; Risk Factors:Hypertension and Diabetes.                 Thyroid Disease.   Sonographer:    Jonelle Sidle Dance Referring Phys: [id number] [place]  1. Left ventricular ejection fraction, by estimation, is 65 to 70%. The left ventricle has normal function. The left ventricle has no regional wall motion abnormalities. Indeterminate diastolic filling due to E-A fusion.  2. Right ventricular systolic function is normal. The right ventricular size is normal. There is severely elevated pulmonary artery systolic pressure. The estimated right ventricular systolic pressure is 42.5 mmHg.  3. The mitral valve is normal in structure. Trivial mitral valve regurgitation.  4. Tricuspid valve regurgitation is moderate.  5. The aortic valve is grossly normal. Aortic valve regurgitation is not visualized. No aortic stenosis is present.  6. The inferior vena cava is normal in size with <50% respiratory variability, suggesting right atrial pressure of 8 mmHg. Comparison(s): A prior study was performed on 06/25/20. No significant change from prior study. Prior images reviewed side by side. Slight improvement in PASP from prior study. FINDINGS  Left Ventricle: Left ventricular ejection fraction, by estimation, is 65 to 70%. The left ventricle has normal function. The left ventricle has no regional wall motion abnormalities. The left ventricular internal cavity size was normal in size. There is  no left ventricular hypertrophy. Indeterminate diastolic filling due to E-A fusion. Right Ventricle: The right ventricular size is normal. No increase in right ventricular wall thickness. Right ventricular systolic function is normal. There is severely elevated pulmonary artery systolic pressure. The tricuspid regurgitant velocity is 3.86 m/s, and with an assumed right atrial pressure of 8 mmHg, the estimated right ventricular systolic pressure is 17.4 mmHg. Left Atrium: Left atrial size was normal in size. Right Atrium: Right atrial size was normal in size. Pericardium: There is no evidence of pericardial  effusion. Mitral Valve: The mitral valve is normal in structure. Trivial mitral valve regurgitation. Tricuspid Valve: The tricuspid valve is normal in structure. Tricuspid valve regurgitation is moderate. Aortic Valve: The aortic valve is grossly normal. Aortic valve regurgitation is not visualized. No aortic stenosis is present. Pulmonic Valve: The pulmonic valve was normal in structure. Pulmonic valve regurgitation is not visualized. Aorta: The aortic root and ascending aorta are structurally normal, with no evidence of dilitation. Venous: The inferior vena cava is normal in size with less than 50% respiratory variability, suggesting right atrial pressure of 8 mmHg. IAS/Shunts: The atrial septum is grossly normal.  LEFT VENTRICLE PLAX 2D LVIDd:         4.80 cm  Diastology LVIDs:         3.30 cm  LV e' medial:  7.51 cm/s LV PW:         1.10 cm  LV e' lateral: 9.57 cm/s LV IVS:        0.80 cm LVOT diam:     2.10 cm LV SV:         53 LV SV Index:   29 LVOT Area:     3.46 cm  RIGHT VENTRICLE             IVC RV Basal diam:  2.50 cm     IVC diam: 1.90 cm RV S prime:     11.60 cm/s TAPSE (M-mode): 2.0 cm LEFT ATRIUM             Index       RIGHT ATRIUM           Index LA diam:        3.70 cm 2.03 cm/m  RA Area:     14.30 cm LA Vol (A2C):   49.1 ml 26.99 ml/m RA Volume:   35.70 ml  19.63 ml/m LA Vol (A4C):   36.3 ml 19.96 ml/m LA Biplane Vol: 44.1 ml 24.25 ml/m  AORTIC VALVE LVOT Vmax:   81.30 cm/s LVOT Vmean:  50.400 cm/s LVOT VTI:    0.154 m  AORTA Ao Root diam: 3.40 cm Ao Asc diam:  3.10 cm MV A velocity: 97.85 cm/s  TRICUSPID VALVE  TR Peak grad:   59.6 mmHg                            TR Vmax:        386.00 cm/s                             SHUNTS                            Systemic VTI:  0.15 m                            Systemic Diam: 2.10 cm Rudean Haskell MD Electronically signed by Rudean Haskell MD Signature Date/Time: 11/15/2020/3:27:45 PM    Final      Discharge  Instructions: Take prednisone 60 mg for 10 days. This will be tapered at your follow up appt.   Advised to continue home insulin regimen.   Signed: Mike Craze, DO 11/22/2020, 3:15 PM

## 2020-12-03 ENCOUNTER — Other Ambulatory Visit: Payer: Medicare Other

## 2020-12-03 ENCOUNTER — Encounter: Payer: Self-pay | Admitting: Internal Medicine

## 2020-12-03 ENCOUNTER — Other Ambulatory Visit: Payer: Self-pay

## 2020-12-03 ENCOUNTER — Ambulatory Visit (INDEPENDENT_AMBULATORY_CARE_PROVIDER_SITE_OTHER): Payer: Medicare Other | Admitting: Internal Medicine

## 2020-12-03 VITALS — BP 126/82 | HR 94 | Temp 97.6°F | Ht 66.0 in | Wt 173.2 lb

## 2020-12-03 DIAGNOSIS — J8489 Other specified interstitial pulmonary diseases: Secondary | ICD-10-CM

## 2020-12-03 DIAGNOSIS — I2723 Pulmonary hypertension due to lung diseases and hypoxia: Secondary | ICD-10-CM

## 2020-12-03 DIAGNOSIS — Z7185 Encounter for immunization safety counseling: Secondary | ICD-10-CM | POA: Diagnosis not present

## 2020-12-03 DIAGNOSIS — D849 Immunodeficiency, unspecified: Secondary | ICD-10-CM | POA: Diagnosis not present

## 2020-12-03 DIAGNOSIS — R0902 Hypoxemia: Secondary | ICD-10-CM | POA: Diagnosis not present

## 2020-12-03 DIAGNOSIS — M359 Systemic involvement of connective tissue, unspecified: Secondary | ICD-10-CM

## 2020-12-03 DIAGNOSIS — J9611 Chronic respiratory failure with hypoxia: Secondary | ICD-10-CM | POA: Diagnosis not present

## 2020-12-03 NOTE — Patient Instructions (Addendum)
Interstitial lung disease due to connective tissue disease (HCC) Chronic respiratory failure with hypoxia ([place])  -Glad you are much better after your recent pulmonary fibrosis flareup. -At this point in time antifibrotic subcostal side effects - overall progressive disease - and counseled of bad prognossi  Plan -Supportive care with oxygen therapy and physical conditioning -Continue participation in ILD-pro registry -Take consent form for PULSE study by [employer] -there is a inhaled nitric oxide device early phase trial looking to see if your symptoms and movement can improve  - dw research [facility]   WHO group 3 pulmonary arterial hypertension ([place])  -You have this based on right heart catheterization in January 2022  Plan -We will touch base with Dr. Haroldine Laws and/Lauren Lynelle Smoke to see status of prescription for inhaled treprostinil which could be of potential benefit for you  Immunosuppressed status [facility]) Vaccine counseling  -Your Covid IgG antibody in October 2021 was negative.  Glad after that you have had the booster shot.  The booster shot was in late October 2021  Plan -We will recheck Covid IgG today and if negative will refer you to monoclonal antibody EVUSHELD prophylaxis  Follow-up -In 2 months do spirometry and DLCO -Return to see Dr. Chase Caller for standard of care visit in 30-minute slot in 2 months

## 2020-12-03 NOTE — Progress Notes (Signed)
OV 01/01/2017  Chief Complaint  Patient presents with  . Follow-up    Pt here after PFT. Pt states overall his breathing is unchanged since last OV. Pt c/o occ dry cough. Pt deneis CP/tightness and f/c/s.    Follow-up interstitial lung disease UIP pattern associated with rheumatoid arthritis and immune suppression. Last visit in March 2017. He doesn't that overall he has been stable. He has a new rheumatologist but still at [facility] Dr [name]. His wife also says he is stable. However as he started talking to them his wife slowly started telling me that for the last few to several months he is wheezing more than usual and is a little more short of breath than usual. However he denied it. This is compliant with his medications. [name] not on any inhaler therapy. He uses oxygen only at night. Previous CT scans of the chest in September 2016 in March 2017 did not report any associated emphysema. His most recent pulmonary function test is shown that on Combivent. I personally visualized the graph: [name] Kitchen Forced vital capacity is stable with a diffusion capacity seems to decline over time. Walking desaturation test 185 feet 3 laps on room air: He desaturated to 84%. And then we walked him on 2 Steven oxygen he did not desaturate.   OV 06/23/2017  Chief Complaint  Patient presents with  . Follow-up    pt here after CT in 04/2017. Pt states his breathing is unchanged since last OV. Pt c/o interemittent dry cough. Pt denies CP/tightness and f/c/s.      Follow-up interstitial lung disease UIP pattern associated with rheumatoid arthritis and on Imuran. Last visit in March 2018 I was concerned about progressive interstitial lung disease because he started desaturating on room air and his FVC show decline. Currently rated a high-resolution CT chest that confirmed progressive lung disease. We walked him 185 feet 3 laps on room air and he desaturated at third lap to 89%. This is very  similar to previous visit suggesting that  that his decline has been more gradual over the last 1 year. Compared to last visit he feels stable. His wife feels the same. [name] aware of his worsening fibrosis and the potential need to change anti fibrotics  OV 09/27/2017  Chief Complaint  Patient presents with  . Follow-up    PFT done today. Pt states that he thinks his breathing has become worse. Has complaints of SOB with exertion, occ. dry cough. Denies any CP. States that he does wear O2 at night, DME: [facility].     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  Patient is on Imuran.  [name] having progressive disease this year 2018.  He told my certified medical assistant that he is having progressive shortness of breath but to me he tells me that he is stable.  As always he is a very poor historian.  Wife gives a better history.  He has oxygen at home for the night which she uses but does not use his exertional small portable system even though this helps him.  They do admit that dyspnea is progressive.  In fact pulmonary function test today shows progressive disease.  He also seem to desaturate a little bit more easily.  He continues to be on Imuran.  In talking to him it appears his rheumatologist has now started him on Rituxan.  He has had 2 doses 2 weeks apart with the most recent  one being mid November 2018.  The next dose is at mid May 2019 which is 51-monthinterval.  He says he is up-to-date with his flu shot but is not had a conversation with his primary care physician about the new shingles vaccine that is inactivated vaccine.  [name] not having much of joint issues.  [name] aware that the Rituxan is specifically designed to slow down the progression of pulmonary fibrosis.  Walking desaturation test 185 feet x3 laps on room air with a forehead probe.  Resting heart rate was 95/min.  Final heart rate 108/min.  Resting pulse ox was 95%.  By the time he  finished his second lap he desaturated to 84%.  He was then placed on 2 Steven nasal cannula and he was able to maintain his saturation to 95%.   OV 12/20/2017  Chief Complaint  Patient presents with  . Follow-up    follow up PFT,SOB with activity/exertion   [name] is here for follow-up because of his progressive UIP pattern pulmonary fibrosis related to rheumatoid arthritis.  At his last visit he was started on Rituxan by rheumatology.  Since then we started him on portable oxygen but he says he is not using it because he does not like to use it.  He does use oxygen at night which is helping.  His next dose of Rituxan is in May 2019.  Both he and his wife attest that since the start of Rituxan his symptoms are stable particularly shortness of breath is not any worse.  In fact pulmonary function test today shows continued ongoing stability with FVC and DLCO would suggest that the Rituxan might be helping him.  There are no new issues.  [name] here to have a shingles vaccine.  I told him about this last visit to talk to primary care physician but he does not remember this.   IMPRESSION: June 2018 1. Continued interval progression of fibrotic interstitial lung disease with extensive honeycombing, asymmetrically involving the right lung, with mild basilar predominance, diagnostic of usual interstitial pneumonia (UIP) . 2. Stable mild reactive mediastinal lymphadenopathy. 3. Aortic atherosclerosis.  One vessel coronary atherosclerosis.   Electronically Signed   By: [name]   On: 04/09/2017 10:51   OV 08/01/2018  Subjective:  Patient ID: [name] , DOB: 601/20/1947, age 75y.o. , MRN: [id number], ADDRESS: [address] [place] [id number]  08/01/2018 -   Chief Complaint  Patient presents with  . Follow-up    PFT today, increased coughing (non-productive)    Follow-up interstitial lung disease UIP pattern associated with rheumatoid arthritis. Rx Rituxan since approx  Oct/Nov 2018. Background Immuran  HPI [name] 75y.o. -presents as usual with his wife.  He tells me that he continues on background Imuran.  [name] also on scheduled Rituxan.  He has an infusion coming up in a month or so.  This is according to his wife.  He continues to be a poor historian.  As best as I can gather from both of them is that his shortness of breath is actually a little bit better ever since starting Rituxan..  In fact his PFTs show continued improvement/stability.  With his FVC and DLCO.  However he tells me that his cough might be a little bit worse.  In talking to him it appears that is only mildly worse.  It happens mostly at night when he lies down.  He coughs a few  times and then it does not bother him.  The cough is dry.  He denies any postnasal drip or acid reflux disease.  He does not have any fever or sputum production.       OV 10/06/2018  Subjective:  Patient ID: Nainoa Woldt, male , DOB: 1946/06/15 , age 39 y.o. , MRN: [id number] , ADDRESS: [address] [place] [address]   10/06/2018 -   Chief Complaint  Patient presents with  . Follow-up    f/u pulmonary fibrosis, no symptoms, complaints, breathing doind well     HPI [name] 75 y.o. -returns for ILD follow-up in the setting of rheumatoid arthritis.  This time he is not here with his wife who has other schedule conflicts.  He tells me overall he stable.  He has now started Rituxan.  As again he is a poor historian.  I have him starting Rituxan a year ago but he told me that it was only started recently and has had only 1 infusion.  He tells me that he is stable.  His cough is well controlled with this anticholinergic he is using nighttime oxygen.  He says he is compliant with his medications.  Last visit I told him to inquire about using Bactrim prophylaxis in the setting of B-cell depletion in the setting of Rituxan.  He has no idea recollection of this conversation.  So I did tell him that we will check  his G6PD and discuss directly with his rheumatologist about starting Bactrim prophylaxis.  [name] okay with this plan.  Overall dyspnea and cough are stable and it is mild.  Walking desaturation test shows stability.     OV 06/29/2019  Subjective:  Patient ID: [name], male , DOB: 1946-09-15 , age 82 y.o. , MRN: [id number] , ADDRESS: [address] [place] [address]   Follow-up interstitial lung disease UIP pattern associated with rheumatoid arthritis. Rx Rituxan since approx Oct/Nov 2018. On Background Immuran, prednisone and dapsone for pcp prophylaxois = high risk medication use   06/29/2019 - ILD- RA  followup    HPI [name] 75 y.o. -presents for follow-up.  I personally saw him December 2019 before the onset of the pandemic.  He tells me that he is doing fair.  He feels he is overall stable.  He continues to be on Rituxan.  He tells me the last dose of this infusion was in May 05, 2019.  This is managed by rheumatology.  In addition he is on Imuran, prednisone.  Also by middle of year my nurse practitioner started him on dapsone for PCP prophylaxis.  He did have a high-resolution CT chest that shows UIP findings and stability since 2018.  However, he did desaturate with exertion when he saw the nurse practitioner just over a month ago.  This is a new finding as documented below.  We walked him again today and again found his desaturation with exertion.  It required 5 Steven to correct.  In past he has done well with RA at times but at times required 2L. He denies any fever or sputum production.  His previous echocardiogram was in 2013.  Most recent CT scan of the chest notes coronary artery calcification   Noted WBC going down after dapsine start Results for OVAL, CAVAZOS (MRN [id number]) as of 06/29/2019 10:19  Ref. Range 05/30/2019 10:17 06/07/2019 09:07 06/13/2019 08:59 06/20/2019 09:50  WBC Latest Ref Range: 4.0 - 10.5 K/uL 8.8 6.2 4.3 3.4 (Steven)  IMPRESSION: HRCT July 2020 1. No  significant interval change in severe pulmonary fibrosis, which is heterogeneously distributed and most severe in the right lung base, featuring extensive honeycombing and mild traction bronchiectasis. Although findings are not significantly changed compared to most recent examination dated 2018, they are clearly progressed over time in comparison to priors dating back to 2013. Findings remain in a UIP pattern by ATS pulmonary fibrosis criteria  2.  Coronary artery disease.   Electronically Signed   By: Eddie Candle [name]   On: 05/12/2019 13:14  ROS - per HPI   OV 01/04/2020  Subjective:  Patient ID: [name], male , DOB: 01/03/1946 , age 26 y.o. , MRN: [id number] , [address] [place] [address]   01/04/2020 -   Chief Complaint  Patient presents with  . Follow-up    Pt states he has been doing good since last visit. States he still has an occ cough.    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 Rx Rituxan since approx Oct/Nov 2018. On Background Immuran, prednisone and  = high risk medication use     HPI [name] 75 y.o. -returns for follow-up.  Last seen in the summer 2020.  Overall he is doing stable.  He continues with his immunosuppressive medications through rheumatology.  This include Imuran prednisone and Rituxan.  He had his infusion of Rituxan this week.  He feels stable but his pulmonary function test done for this visit shows a decline.  It is documented below his walking desaturation test also is a decline from the past but similar to last visit 6 months or so ago.  His main symptom is chronic cough.  [name] labeled it as a level 3 out of 5 but it appears it might actually be more severe.  [name] asking for palliative relief of cough.  His last liver function test with [place] was in the summer 2020.  It appears per review of the chart in the interim he has seen cardiology Dr. Percival Spanish for his coronary artery  calcification and is on expectant follow-up for that.  No stress test has been recommended because of the lack of chest pain.  His wife who is usually here with him is not here today.  He said she slept in.  [name] on MDI - no emphysema noted in prior CT    ROS - per HPI  OV 02/15/2020  Subjective:  Patient ID: [name], male , DOB: 24-Mar-1946 , age 71 y.o. , MRN: [id number] , ADDRESS: [place] [id number]   02/15/2020 -   Chief Complaint  Patient presents with  . Follow-up    Follow-up interstitial lung disease UIP pattern associated with rheumatoid arthritis.   - Rx Rituxan since approx Oct/Nov 2018. On Background Immuran, prednisone and  = high risk medication use  - started ofev mid -  end of march 2021 for progressive disease    HPI [name] 75 y.o. -he has progressive ILD because of rheumatoid arthritis UIP pattern.  Last visit started him on nintedanib.  [name] now here to follow-up for tolerance and monitoring of nintedanib because this requires intensive therapeutic monitoring.  [name] also immunosuppressed.  He tells me that he is now started nintedanib approximately 1 month ago.  [name] tolerating it fine without any nausea vomiting diarrhea.  Last visit he took the consent [name] for the ILD-pro registry study.  [name]  agreed now to participate in it.  He meets inclusion exclusion criteria because of progressive disease.  At this point in time he is got minimal symptoms.  He desaturated as before.  However he is not compliant with his portable oxygen with exertion.  His cough is improved because of Hycodan.   OV 05/01/2020  Subjective:  Patient ID: [name], male , DOB: 1946/07/18 , age 69 y.o. , MRN: [id number] , ADDRESS: [place] [address]   05/01/2020 -   Chief Complaint  Patient presents with  . Follow-up    pt is here to to disuss ild    OV 05/30/2020   Subjective:  Patient ID: [name], male , DOB:  May 07, 1946, age 57 y.o. years. , MRN: [id number],  ADDRESS: [address] [place] [address] PCP  Merrilee Seashore, MD Providers : Treatment Team:  Attending Provider: Brand Males, MD   Chief Complaint  Patient presents with  . Follow-up    Pt states he has been doing good since last visit and states his breathing is about the same.     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   - Rx Rituxan since approx Oct/Nov 2018. On Background Immuran, prednisone and  = high risk medication use  - started ofev mid -  end of march 2021 for progressive disease -> changed to [facility]  June 2021    HPI [name] 75 y.o. -presents for follow-up.  This time he has brought his wife.  Have not seen the wife in a long time.  She appears to have lost weight.  She says she is dealing with her own health issues of unexplained weight loss.  She says she is abreast of his health issues when he returns home after visits.  However patient continues to be stoic and always states he is feeling well.  Last time he said he was feeling well but had high level of symptoms.  This time also he says he is feeling well and gaining weight and has marked very low level of symptoms.  Therefore overall his history is unreliable.  In the interim he has had his right heart catheterization.  He had nonobstructive coronary artery disease his.  His mean pulmonary artery pressure was elevated at 25.  Wedge pressure could not be elucidated.  His peripheral vascular resistance appears greater than 3.  Therefore he qualifies for nebulized treprostinil.  However he still in the uptake with his pirfenidone.  [name] not applying sunscreen and he says he will.  [name] tolerating pirfenidone well.  He has gained weight no nausea no vomiting no diarrhea.  He uses 4 Steven of pulsed oxygen at rest.  However at room air at rest he was normal.  He desaturated only after walking 2 laps.    OV  08/28/2020   Subjective:  Patient ID: [name], male , DOB: 1946/04/14, age 76 y.o. years. , MRN: [id number],  ADDRESS: [address] [place] [address] PCP  Merrilee Seashore, MD Providers : Treatment Team:  Attending Provider: Brand Males, MD Patient Care Team: Merrilee Seashore, MD as PCP - General (Internal Medicine) Hennie Duos, MD as Consulting Physician (Rheumatology) Lauraine Rinne, NP as Nurse Practitioner (Pulmonary Disease)    Chief Complaint  Patient presents with  . Follow-up    coughs when starts eating.      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   -  Rx Rituxan since approx Oct/Nov 2018. On Background Immuran, prednisone and  = high risk medication use  - started ofev mid -  end of march 2021 for progressive disease -> changed to [facility]  June 2021 -> stopped sept 2021 due to rash     HPI [name] 75 y.o. -returns for follow-up.  At this visit he was supposed to do spirometry and DLCO but he has not.  He presents by himself.  It is unclear why he did not do spirometry and DLCO.  As usual he is stoic and does not give much of a history.  His symptom score appears stable.  His weight appears stable.  He uses 4 Steven of oxygen.  Between the last 2 visits he made phone calls suggesting initially that the pirfenidone was causing a rash in his upper arm but later the phone call said Spiriva.  We indicated that he should stop the Spiriva.  Today he tells me that he is actually continuing his Spiriva.  He tells me that the rash was caused by the pirfenidone/Esbriet.  Initially did not seem sure it was because of the [place] but latter when I repeated questions in multiple different ways he indicated it was desperate causing the rash therefore he stopped it.  He has been 1 month since he stopped it and the rash is disappeared.  The rash was in a nonexposed area in the right upper extremity.  He has had his Covid  vaccine but has not had his booster.  He continues to be on Rituxan which is tolerating well.  I did indicate to him that in the absence of B cells he might not have antibodies and he should get his Covid booster.  We will check Covid IgG as follow-up post booster needs continued intensive monitoring due to high complex status and immunosuppression    SYMPTOM SCALE - ILD 06/29/2019  01/04/2020  02/15/2020  05/01/2020  05/30/2020  08/28/2020   O2 use Uses 3-5L with exertion yuses 3-5L with exertion Non compliant 168# and 4L with exertion - on esbriet 172# on esbriet - 4L at rest  - filled bu him and wife 174# 4L o2  Shortness of Breath 0 -> 5 scale with 5 being worst (score 6 If unable to do)       At rest 3 0 0 4 0 0  Simple tasks - showers, clothes change, eating, shaving 2 1 0 5 0 0  Household (dishes, doing bed, laundry) 2 1 0 5 0   Shopping _0 0 0  Walking level at own pace _1 0 0  Walking up Stairs _2 Total (40 - 48) Dyspnea Score _3 How bad is your cough? _4 How bad is your fatigue 2 1 0 4 0 0  nasuea    4 0 0  vomit    0 0 0  diarrhea  0 0 0 0 0  anxiety  0 0 0 0 0  deipression  0 0 0 0 0       Simple office walk 08/01/2018 -  185 feet x  3 laps goal with forehead probe at [employer]  10/06/2018 250 feet x 3 laps 06/29/2019  01/04/2020  02/15/2020  05/30/2020   O2 used _5  raoom air after turing o2  off fx 5 mn  Number laps completed 3 3 Aim for 3 1 Did only 1 of 3 Did all 3 Did only 2 laps  Comments about pace normal normal  avg pace avg opae avg pagce  Resting Pulse Ox/HR 100% and 80/min 100% and 81/min 94% and 96/min 96% and 101/min 99% and 98/min 96% and 94/min  Final Pulse Ox/HR 94% and 114/min 91% and 111/min 86% at first lap, HR 131 86% and 118/min 88% and 123/min 83% and 125/,n  Desaturated </= 88% no no  yes yes   Desaturated <= 3% points yes yes  yes yes   Got Tachycardic >/= 90/min yes  yes  yes yes   Symptoms at end of test x x  Mild dyspnea dyspnea   Miscellaneous comments x x Needed 5L [place] to correct       Results for DILRAJ, KILLGORE (MRN [id number]) as of 01/04/2020 09:36  Ref. Range 01/23/2014 15:38 10/01/2014 11:24 07/29/2015 11:09 12/18/2016 09:34 09/27/2017 09:33 12/20/2017 11:29 08/01/2018 09:39 12/26/2019 08:54 04/08/20  FVC-Pre Latest Units: Steven 2.18 2.05 2.17 2.21 2.06 2.10 2.09 1.96 1.73  FVC-%Pred-Pre Latest Units: % 66 62 66 68 64 65 69 61 54%   Results for HEYWARD, DOUTHIT (MRN [id number]) as of 01/04/2020 09:36  Ref. Range 01/23/2014 15:38 10/01/2014 11:24 07/29/2015 11:09 12/18/2016 09:34 09/27/2017 09:33 12/20/2017 11:29 08/01/2018 09:39 12/26/2019 08:54 6/7  DLCO unc Latest Units: ml/min/mmHg 17.93 16.86 16.72 13.81 12.14 12.30 13.24 13.14 9/15  DLCO unc % pred Latest Units: % 66 62 62 51 45 45 52 58 41%       OV 12/03/2020  Subjective:  Patient ID: [name], male , DOB: 15-Feb-1946 , age 61 y.o. , MRN: [id number] , ADDRESS: [address] [place] [address] PCP Merrilee Seashore, MD Patient Care Team: Merrilee Seashore, MD as PCP - General (Internal Medicine) Minus Breeding, MD as PCP - Cardiology (Cardiology) Hennie Duos, MD as Consulting Physician (Rheumatology) Lauraine Rinne, NP as Nurse Practitioner (Pulmonary Disease)  This Provider for this visit: Treatment Team:  Attending Provider: Brand Males, MD    12/03/2020 -   Chief Complaint  Patient presents with  . Follow-up    F/U after hospital stay. States he has been feeling well since being home.      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   - Rx Rituxan since approx Oct/Nov 2018. On Background Immuran, prednisone and  = high risk medication use  - started ofev mid -  end of march 2021 for progressive disease -> changed to [facility]  June 2021 -> stopped sept 2021 due to rash  -Participant in ILD-pro registry study   Pulm HTn [place] 11/18/20 v- Dr  Glori Bickers  Repeat [place] yesterday c/w mild to moderate pulmonary HTN with high cardiac output. PA = 59/18 (34). PCW = 10. PVR = 3.1  - Given PAH in setting of IPF, a trial of inhaled treprostinil seems reasonable    12/03/2020 [name] returns for follow-up.  After my last visit in October 2021 he decompensated in January 2022 and admitted to the hospital intensive care unit needing BiPAP for respiratory failure in January 2022 last month.  I was in touch with the intensive care physician Dr. Doyne Keel and heart failure specialist Dr. Haroldine Laws during this time.  They did a right heart catheterization in the results are above.  Patient does have WHO group 3 pulmonary hypertension.  Inhaled treprostinil is indicated.  Dr.  Bensimhon's pharmacist Audry Riles working on this.  I just touch base with Dr. Haroldine Laws about this.  Patient is here with his wife.  He says he is much improved.  [name] back to his 3 Steven nasal cannula at baseline.  He reports having had his Covid booster in October 2021.  He had his booster after we found that his Covid IgG was negative.  Of note he is on Rituxan which suppresses IgG production.  He and his wife are interested in future therapeutic options.  [name] a participant in the ILD-pro registry.   6 reviewed all his recent charts and visualized images.  SYMPTOM SCALE - ILD 06/29/2019  01/04/2020  02/15/2020  05/01/2020  05/30/2020  08/28/2020   O2 use Uses 3-5L with exertion yuses 3-5L with exertion Non compliant 168# and 4L with exertion - on esbriet 172# on esbriet - 4L at rest  - filled bu him and wife 174# 4L o2  Shortness of Breath 0 -> 5 scale with 5 being worst (score 6 If unable to do)       At rest 3 0 0 4 0 0  Simple tasks - showers, clothes change, eating, shaving 2 1 0 5 0 0  Household (dishes, doing bed, laundry) 2 1 0 5 0   Shopping _0 0 0  Walking level at own pace _1 0 0  Walking up Stairs _2 Total (40 - 48) Dyspnea Score _3 How bad is your cough? _4 How bad is your fatigue 2 1 0 4 0 0  nasuea    4 0 0  vomit    0 0 0  diarrhea  0 0 0 0 0  anxiety  0 0 0 0 0  deipression  0 0 0 0 0       Simple office walk 08/01/2018 -  185 feet x  3 laps goal with forehead probe at [place]  10/06/2018 250 feet x 3 laps 06/29/2019  01/04/2020  02/15/2020  05/30/2020  12/03/2020 Using 3L [place] at home all time  O2 used _5  raoom air after turing o2 off fx 5 mn Room air  Number laps completed 3 3 Aim for 3 1 Did only 1 of 3 Did all 3 Did only 2 laps Did only 0.5 lap  Comments about pace normal normal  avg pace avg opae avg pagce avg pace  Resting Pulse Ox/HR 100% and 80/min 100% and 81/min 94% and 96/min 96% and 101/min 99% and 98/min 96% and 94/min 96% and HR 96  Final Pulse Ox/HR 94% and 114/min 91% and 111/min 86% at first lap, HR 131 86% and 118/min 88% and 123/min 83% and 125/,n 85% and HR 124 at half lap  Desaturated </= 88% no no  yes yes    Desaturated <= 3% points yes yes  yes yes    Got Tachycardic >/= 90/min yes yes  yes yes    Symptoms at end of test x x  Mild dyspnea dyspnea    Miscellaneous comments x x Needed 5L [place] to correct    Corrected with 4L [place] and did noly 2 laps      PFT  PFT Results Latest Ref Rng & Units 04/08/2020 12/26/2019 08/01/2018 12/20/2017 09/27/2017 12/18/2016 07/29/2015  FVC-Pre Steven 1.73 1.96  2.09 2.10 2.06 2.21 2.17  FVC-Predicted Pre % 54 61 69 65 64 68 66  FVC-Post Steven - - - - - 2.28 2.20  FVC-Predicted Post % - - - - - 70 67  Pre FEV1/FVC % % 90 86 89 88 88 86 86  Post FEV1/FCV % % - - - - - 88 89  FEV1-Pre Steven 1.55 1.69 1.87 1.84 1.81 1.92 1.88  FEV1-Predicted Pre % 66 72 84 77 75 80 77  FEV1-Post Steven - - - - - 2.01 1.95  DLCO uncorrected ml/min/mmHg 8.10 13.14 13.24 12.30 12.14 13.81 16.72  DLCO UNC% % 36 58 52 45 45 51 62  DLCO corrected ml/min/mmHg 9.15 - - - 13.07 14.51 -  DLCO COR %Predicted % 41 - - - 48 53 -  DLVA Predicted % 68  88 95 80 78 90 119  TLC Steven - - - - - - 3.91  TLC % Predicted % - - - - - - 62  RV % Predicted % - - - - - - 63       has a past medical history of Arthritis, COPD (chronic obstructive pulmonary disease) (HCC), Diabetes mellitus, Hypertension, Pneumonia, and Thyroid disease.   reports that he quit smoking about 36 years ago. His smoking use included cigarettes. He has a 5.00 pack-year smoking history. He has never used smokeless tobacco.  Past Surgical History:  Procedure Laterality Date  . KNEE ARTHROSCOPY    . NO PAST SURGERIES    . RIGHT HEART CATH N/A 11/18/2020   Procedure: RIGHT HEART CATH;  Surgeon: Jolaine Artist, MD;  Location: [facility];  Service: Cardiovascular;  Laterality: N/A;  . RIGHT/LEFT HEART CATH AND CORONARY ANGIOGRAPHY N/A 05/21/2020   Procedure: RIGHT/LEFT HEART CATH AND CORONARY ANGIOGRAPHY;  Surgeon: Martinique, Peter M, MD;  Location: [facility];  Service: Cardiovascular;  Laterality: N/A;    Allergies  Allergen Reactions  . Bactrim [Sulfamethoxazole-Trimethoprim] Shortness Of Breath    Patient states "got short winded"  . Pirfenidone Rash  . Spiriva Respimat [Tiotropium Bromide Monohydrate] Rash  . Ofev [Nintedanib]     Immunization History  Administered Date(s) Administered  . Fluad Quad(high Dose 65+) 08/12/2020  . Influenza Split 08/03/2011, 07/03/2012, 09/02/2013, 07/03/2014  . Influenza, High Dose Seasonal PF 08/02/2016, 08/02/2017, 07/25/2018, 07/04/2019  . Influenza,inj,Quad PF,6+ Mos 07/29/2015  . PFIZER(Purple Top)SARS-COV-2 Vaccination 01/13/2020, 02/05/2020  . Pneumococcal Conjugate-13 08/01/2018  . Pneumococcal Polysaccharide-23 11/03/2007  . Zoster Recombinat (Shingrix) 04/11/2019    Family History  Problem Relation Age of Onset  . Diabetes Mother   . Stroke Mother      Current Outpatient Medications:  .  acetaminophen (TYLENOL) 500 MG tablet, Take 1,000 mg by mouth every 6 (six) hours as needed for moderate pain  or headache., Disp: , Rfl:  .  albuterol (VENTOLIN HFA) 108 (90 Base) MCG/ACT inhaler, Inhale 2 puffs into the lungs every 6 (six) hours as needed., Disp: , Rfl:  .  amLODipine (NORVASC) 10 MG tablet, Take 10 mg by mouth daily., Disp: , Rfl:  .  aspirin 81 MG tablet, Take 81 mg by mouth daily., Disp: , Rfl:  .  atorvastatin (LIPITOR) 20 MG tablet, Take 20 mg by mouth daily., Disp: , Rfl: 2 .  azaTHIOprine (IMURAN) 50 MG tablet, Take 100 mg by mouth daily. , Disp: , Rfl:  .  benzonatate (TESSALON) 100 MG capsule, Take 2 capsules (200 mg total) by mouth 3 (three) times daily  as needed for cough., Disp: 30 capsule, Rfl: 1 .  fluticasone (FLONASE) 50 MCG/ACT nasal spray, Place 2 sprays into both nostrils daily., Disp: 16 g, Rfl: 2 .  folic acid (FOLVITE) 329 MCG tablet, Take 400 mcg by mouth daily., Disp: , Rfl:  .  insulin NPH Human (NOVOLIN N) 100 UNIT/ML injection, Inject 12 Units into the skin See admin instructions. Inject 10 units every morning and 20 units every evening, Disp: , Rfl:  .  Insulin Syringe-Needle U-100 (INSULIN SYRINGE .3CC/31GX5/16") 31G X 5/16" 0.3 ML MISC, Inject 1 Syringe into the skin as directed., Disp: , Rfl:  .  levothyroxine (SYNTHROID, LEVOTHROID) 25 MCG tablet, Take 25 mcg by mouth daily., Disp: , Rfl:  .  metFORMIN (GLUCOPHAGE-XR) 500 MG 24 hr tablet, Take 2 tablets (1,000 mg total) by mouth 2 (two) times daily., Disp: , Rfl:  .  ondansetron (ZOFRAN) 4 MG tablet, Take 1 tablet (4 mg total) by mouth 3 (three) times daily as needed for nausea or vomiting., Disp: 30 tablet, Rfl: 5 .  pantoprazole (PROTONIX) 40 MG tablet, Take 40 mg by mouth daily., Disp: , Rfl:  .  predniSONE (DELTASONE) 20 MG tablet, Take 3 tablets (60 mg total) by mouth daily with breakfast for 14 days., Disp: 42 tablet, Rfl: 0      Objective:   Vitals:   12/03/20 0929  BP: 126/82  Pulse: 94  Temp: 97.6 F (36.4 C)  TempSrc: Temporal  SpO2: 100%  Weight: 173 lb 3.2 oz (78.6 kg)  Height: _0   (1.676 m)    Estimated body mass index is 27.96 kg/m as calculated from the following:   Height as of this encounter: _1  (1.676 m).   Weight as of this encounter: 173 lb 3.2 oz (78.6 kg).  _2 @  [facility] Weights   12/03/20 0929  Weight: 173 lb 3.2 oz (78.6 kg)     Physical Exam General: No distress. Looks well Neuro: Alert and Oriented x 3. GCS 15. Speech normal Psych: Pleasant Resp:  Barrel Chest - no.  Wheeze - no, Crackles - yes, No overt respiratory distress CVS: Normal heart sounds. Murmurs - no Ext: Stigmata of Connective Tissue Disease - RA + HEENT: Normal upper airway. PEERL +. No post nasal drip        Assessment:       ICD-10-CM   1. Interstitial lung disease due to connective tissue disease ([place])  J84.89    M35.9   2. Chronic respiratory failure with hypoxia (HCC)  J96.11   3. WHO group 3 pulmonary arterial hypertension (HCC)  I27.23   4. Immunosuppressed status ([place])  D84.9   5. Vaccine counseling  Z71.85        Plan:     Patient Instructions  Interstitial lung disease due to connective tissue disease ([place]) Chronic respiratory failure with hypoxia ([place])  -Glad you are much better after your recent pulmonary fibrosis flareup. -At this point in time antifibrotic subcostal side effects  Plan -Supportive care with oxygen therapy and physical conditioning -Continue participation in ILD-pro registry -Take consent [name] for PULSE study by [employer] -there is a inhaled nitric oxide device early phase trial looking to see if your symptoms and movement can improve   WHO group 3 pulmonary arterial hypertension ([place])  -You have this based on right heart catheterization in January 2022  Plan -We will touch base with Dr. Haroldine Laws and/Lauren Lynelle Smoke to see status of prescription for inhaled treprostinil which could be of potential  benefit for you  Immunosuppressed status ([place]) Vaccine counseling  -Your Covid IgG antibody in October 2021 was  negative.  Glad after that you have had the booster shot.  The booster shot was in late October 2021  Plan -We will recheck Covid IgG today and if negative will refer you to monoclonal antibody EVUSHELD prophylaxis  Follow-up -In 2 months do spirometry and DLCO -Return to see Dr. Chase Caller for standard of care visit in 30-minute slot in 2 months  ( Level 05 visit: Estb 40-54 min   in  visit type: on-site physical face to visit  in total care time and counseling or/and coordination of care by this undersigned MD - Dr Brand Males. This includes one or more of the following on this same day 12/03/2020: pre-charting, chart review, note writing, documentation discussion of test results, diagnostic or treatment recommendations, prognosis, risks and benefits of management options, instructions, education, compliance or risk-factor reduction. It excludes time spent by the [place] or office staff in the care of the patient. Actual time 40 min)   SIGNATURE    Dr. Brand Males, [name], [name],  Pulmonary and Critical Care Medicine Staff Physician, [place] Director - Interstitial Lung Disease  Program  Pulmonary [place] [facility] [place], [place], [address]  Pager: [phone], If no answer or between  15:00h - 7:00h: call [phone] Telephone: [phone]  10:08 AM 12/03/2020

## 2020-12-04 LAB — SARS-COV-2 ANTIBODIES: SARS-CoV-2 Antibodies: NEGATIVE

## 2020-12-10 ENCOUNTER — Telehealth: Payer: Self-pay | Admitting: Internal Medicine

## 2020-12-10 DIAGNOSIS — J8410 Pulmonary fibrosis, unspecified: Secondary | ICD-10-CM

## 2020-12-10 NOTE — Telephone Encounter (Signed)
Emily   His Covid IgG is negative. Please refer him to [facility] or Dr Horald Pollen for [facility]. [name] high risk for bad covid  Thanks MR

## 2020-12-10 NOTE — Telephone Encounter (Signed)
Called and spoke with pt letting him know the results of labwork and stated to pt that MR wants [place] to refer him to either [facility] or Dr. Horald Pollen to receive EVUSHELD and pt verbalized understanding.  MR, please advise exactly what referral order needs to be placed so we can get this taken care of for pt.

## 2020-12-10 NOTE — Progress Notes (Deleted)
[name] has not mounted any antibody to covid vaccine  Plan  - please refer him for EVUSHELD either to [facility] or Dr Horald Pollen whoever has supply first  Thanks  MR

## 2020-12-10 NOTE — Telephone Encounter (Signed)
He needs to be referred to the [facility] but Lazaro Arms works for consideration of [place].   But if there are delays in the at [facility]-> you can just make a referral toPhone: [phone] Dr. Horald Pollen for consideration of the same drug

## 2020-12-11 ENCOUNTER — Telehealth (HOSPITAL_COMMUNITY): Payer: Self-pay | Admitting: Pharmacist

## 2020-12-11 DIAGNOSIS — I10 Essential (primary) hypertension: Secondary | ICD-10-CM | POA: Diagnosis not present

## 2020-12-11 DIAGNOSIS — E1065 Type 1 diabetes mellitus with hyperglycemia: Secondary | ICD-10-CM | POA: Diagnosis not present

## 2020-12-11 DIAGNOSIS — E782 Mixed hyperlipidemia: Secondary | ICD-10-CM | POA: Diagnosis not present

## 2020-12-11 DIAGNOSIS — J849 Interstitial pulmonary disease, unspecified: Secondary | ICD-10-CM | POA: Diagnosis not present

## 2020-12-11 NOTE — Telephone Encounter (Signed)
Orders have been placed.  Nothing further is needed.

## 2020-12-11 NOTE — Telephone Encounter (Signed)
Sent in Manufacturer's Assistance application to [facility] [employer] for Tyvaso.   Application pending, will continue to follow.  Zyaire Dumas, PharmD, BCPS, BCCP, CPP Heart Failure Clinic Pharmacist [phone]  

## 2020-12-12 ENCOUNTER — Other Ambulatory Visit: Payer: Self-pay | Admitting: Physician Assistant

## 2020-12-12 DIAGNOSIS — D849 Immunodeficiency, unspecified: Secondary | ICD-10-CM

## 2020-12-12 DIAGNOSIS — J849 Interstitial pulmonary disease, unspecified: Secondary | ICD-10-CM | POA: Diagnosis not present

## 2020-12-12 DIAGNOSIS — J8410 Pulmonary fibrosis, unspecified: Secondary | ICD-10-CM

## 2020-12-12 DIAGNOSIS — M069 Rheumatoid arthritis, unspecified: Secondary | ICD-10-CM

## 2020-12-12 DIAGNOSIS — M8589 Other specified disorders of bone density and structure, multiple sites: Secondary | ICD-10-CM | POA: Diagnosis not present

## 2020-12-12 DIAGNOSIS — M150 Primary generalized (osteo)arthritis: Secondary | ICD-10-CM | POA: Diagnosis not present

## 2020-12-12 DIAGNOSIS — D72819 Decreased white blood cell count, unspecified: Secondary | ICD-10-CM | POA: Diagnosis not present

## 2020-12-12 DIAGNOSIS — E109 Type 1 diabetes mellitus without complications: Secondary | ICD-10-CM | POA: Diagnosis not present

## 2020-12-12 DIAGNOSIS — M0589 Other rheumatoid arthritis with rheumatoid factor of multiple sites: Secondary | ICD-10-CM | POA: Diagnosis not present

## 2020-12-12 DIAGNOSIS — E559 Vitamin D deficiency, unspecified: Secondary | ICD-10-CM | POA: Diagnosis not present

## 2020-12-12 DIAGNOSIS — Z79899 Other long term (current) drug therapy: Secondary | ICD-10-CM | POA: Diagnosis not present

## 2020-12-12 DIAGNOSIS — Z13820 Encounter for screening for osteoporosis: Secondary | ICD-10-CM | POA: Diagnosis not present

## 2020-12-12 NOTE — Progress Notes (Signed)
I connected by phone with [name] on 12/12/2020 at 9:23 AM to discuss the potential use of a new treatment, tixagevimab/cilgavimab, for pre-exposure prophylaxis for prevention of coronavirus disease 2019 (COVID-19) caused by the SARS-CoV-2 virus.  This patient is a 75 y.o. male that meets the FDA criteria for Emergency Use Authorization of tixagevimab/cilgavimab for pre-exposure prophylaxis of COVID-19 disease. Pt meets following criteria:  Age >12 yr and weight > 40kg  Not currently infected with SARS-CoV-2 and has no known recent exposure to an individual infected with SARS-CoV-2 AND o Who has moderate to severe immune compromise due to a medical condition or receipt of immunosuppressive medications or treatments and may not mount an adequate immune response to COVID-19 vaccination or  o Vaccination with any available COVID-19 vaccine, according to the approved or authorized schedule, is not recommended due to a history of severe adverse reaction (e.g., severe allergic reaction) to a COVID-19 vaccine(s) and/or COVID-19 vaccine component(s).  o Patient meets the following definition of mod-severe immune compromised status: 8. Other groups not previously mentioned: 4. on Imuran with no vaccine antibody response   I have spoken and communicated the following to the patient or parent/caregiver regarding COVID monoclonal antibody treatment:  1. FDA has authorized the emergency use of tixagevimab/cilgavimab for the pre-exposure prophylaxis of COVID-19 in patients with moderate-severe immunocompromised status, who meet above EUA criteria.  2. The significant known and potential risks and benefits of COVID monoclonal antibody, and the extent to which such potential risks and benefits are unknown.  3. Information on available alternative treatments and the risks and benefits of those alternatives, including clinical trials.  4. The patient or parent/caregiver has the option to accept or refuse COVID  monoclonal antibody treatment.  After reviewing this information with the patient, agree to receive tixagevimab/cilgavimab  Steven Form, PA-C, 12/12/2020, 9:21 AM

## 2020-12-16 ENCOUNTER — Other Ambulatory Visit: Payer: Self-pay

## 2020-12-16 ENCOUNTER — Ambulatory Visit (HOSPITAL_COMMUNITY)
Admission: RE | Admit: 2020-12-16 | Discharge: 2020-12-16 | Disposition: A | Discharge status: Home / Self Care | Payer: Medicare Other | Source: Ambulatory Visit | Attending: Internal Medicine | Admitting: Internal Medicine

## 2020-12-16 DIAGNOSIS — D849 Immunodeficiency, unspecified: Secondary | ICD-10-CM | POA: Diagnosis not present

## 2020-12-16 DIAGNOSIS — J8410 Pulmonary fibrosis, unspecified: Secondary | ICD-10-CM | POA: Diagnosis not present

## 2020-12-16 DIAGNOSIS — M069 Rheumatoid arthritis, unspecified: Secondary | ICD-10-CM

## 2020-12-16 MED ORDER — TIXAGEVIMAB (PART OF EVUSHELD) INJECTION
150.0000 mg | Freq: Once | INTRAMUSCULAR | Status: AC
  Administered 2020-12-16: 150 mg via INTRAMUSCULAR
  Filled 2020-12-16: qty 1.5

## 2020-12-16 MED ORDER — DIPHENHYDRAMINE HCL 50 MG/ML IJ SOLN: 50.0000 mg | Freq: Once | INTRAMUSCULAR | Status: DC | PRN

## 2020-12-16 MED ORDER — EPINEPHRINE 0.3 MG/0.3ML IJ SOAJ: 0.3000 mg | Freq: Once | INTRAMUSCULAR | Status: DC | PRN

## 2020-12-16 MED ORDER — METHYLPREDNISOLONE SODIUM SUCC 125 MG IJ SOLR: 125.0000 mg | Freq: Once | INTRAMUSCULAR | Status: DC | PRN

## 2020-12-16 MED ORDER — FAMOTIDINE IN NACL 20-0.9 MG/50ML-% IV SOLN: 20.0000 mg | Freq: Once | INTRAVENOUS | Status: DC | PRN

## 2020-12-16 MED ORDER — CILGAVIMAB (PART OF EVUSHELD) INJECTION
150.0000 mg | Freq: Once | INTRAMUSCULAR | Status: AC
  Administered 2020-12-16: 150 mg via INTRAMUSCULAR
  Filled 2020-12-16: qty 1.5

## 2020-12-16 MED ORDER — ALBUTEROL SULFATE HFA 108 (90 BASE) MCG/ACT IN AERS
2.0000 | INHALATION_SPRAY | Freq: Once | RESPIRATORY_TRACT | Status: DC | PRN
  Filled 2020-12-16: qty 6.7

## 2020-12-30 NOTE — Telephone Encounter (Signed)
Patient cleared to receive Tyvaso through [facility].   Audry Riles, PharmD, BCPS, BCCP, CPP Heart Failure Clinic Pharmacist [phone]

## 2020-12-31 DIAGNOSIS — R0902 Hypoxemia: Secondary | ICD-10-CM | POA: Diagnosis not present

## 2021-01-02 ENCOUNTER — Other Ambulatory Visit: Payer: Self-pay | Admitting: Physician Assistant

## 2021-01-02 ENCOUNTER — Telehealth: Payer: Self-pay | Admitting: Internal Medicine

## 2021-01-02 DIAGNOSIS — J8410 Pulmonary fibrosis, unspecified: Secondary | ICD-10-CM

## 2021-01-02 DIAGNOSIS — M359 Systemic involvement of connective tissue, unspecified: Secondary | ICD-10-CM

## 2021-01-02 DIAGNOSIS — J8489 Other specified interstitial pulmonary diseases: Secondary | ICD-10-CM

## 2021-01-02 DIAGNOSIS — M069 Rheumatoid arthritis, unspecified: Secondary | ICD-10-CM

## 2021-01-02 DIAGNOSIS — D849 Immunodeficiency, unspecified: Secondary | ICD-10-CM

## 2021-01-02 DIAGNOSIS — J9611 Chronic respiratory failure with hypoxia: Secondary | ICD-10-CM

## 2021-01-02 NOTE — Telephone Encounter (Signed)
Called and spoke with pt letting him know that we were going to place order stating that he is supposed to be on 4L O2. Pt verbalized understanding. Order placed. Nothing further needed.

## 2021-01-02 NOTE — Progress Notes (Signed)
I reviewed with [name] that we received updated FDA guidance that all patients who previously received Tixagevimab151m/Cilgavimab150mg should receive another 1549mdose to equal the new updated dose of 3003mxagevimab/300mg Cilgavimab.  Patient verbalized understanding and agreed to updated dose.  They will receive this dose on 3/16 @ 12 noon.  Orders placed.  [name]

## 2021-01-03 ENCOUNTER — Telehealth: Payer: Self-pay | Admitting: Internal Medicine

## 2021-01-03 NOTE — Telephone Encounter (Signed)
I have called the pt and he stated that his POC went out on him last night and he is only able to use the tanks that he has now.  He called [facility] and they advised him that they could not do anything without having an order from the office.   I have called and LM for Melissa to call back about this pt.

## 2021-01-06 ENCOUNTER — Telehealth: Payer: Self-pay | Admitting: Internal Medicine

## 2021-01-06 DIAGNOSIS — J8410 Pulmonary fibrosis, unspecified: Secondary | ICD-10-CM

## 2021-01-06 DIAGNOSIS — J9611 Chronic respiratory failure with hypoxia: Secondary | ICD-10-CM

## 2021-01-06 DIAGNOSIS — J8489 Other specified interstitial pulmonary diseases: Secondary | ICD-10-CM

## 2021-01-06 DIAGNOSIS — M359 Systemic involvement of connective tissue, unspecified: Secondary | ICD-10-CM

## 2021-01-06 NOTE — Telephone Encounter (Signed)
My community message to [employer] as well as response received from [employer] shown below: [place], [employer], Waldemar Dickens, [place]; Miquel Dunn; Sandi Raveling, [place] Phone Number: [phone]   We got the order for 4lt but it doesn't say how to administer it ( continues or pulse). I have taken your message and sent to the processing team that should do for now. We will need the order updated to say continues please.   Thank you,   Brad New        Previous Messages   ----- Message -----  From: Lorretta Harp, CMA  Sent: 01/06/2021  3:12 PM EST  To: Darlina Guys, Annamary Rummage New  Subject: RE: Updated info needed.             He uses 4L continuous. I just spoke with pt and he said that all has been taken care of for his O2. Just want to verify and make sure that this has been fixed. If not, please advise if we need to place a new order.   Wah Sabic, CMA    New order has been placed showing 4L continuous. Nothing further needed.

## 2021-01-06 NOTE — Telephone Encounter (Signed)
Community message received from [employer]: Updated info needed.  Received: Today [place], Drucilla Chalet, Waldemar Dickens, CMA; Sandi Raveling, [place] Phone Number: [phone]   Hello,   I received the following email in regards to patient [name] DOB 06-25-1946 and his new o2 order.   We are in need of an order update to show continuous o2 or a verbal order confirming the duration of the order for o2 with an updated order to follow.   Thank you all,   Brad New    Pt was walked at last OV and needed 4L continuous O2. In this encounter from 3/4, pt stated that his POC had quit working. Before we clarify/send a new order to [facility] specifying continuous flow for the 4L O2, we need to see if pt's POC was continuous flow or if it was pulsed flow so we can get all this taken care of. Attempted to call pt but unable to reach. Left message for pt to return call.

## 2021-01-06 NOTE — Telephone Encounter (Signed)
Called and spoke with pt to see if his POC was continuous flow or pulse flow. Pt said all of his O2 is continuous flow. Pt said that his O2 has been taken care of by [facility]. Nothing further needed.

## 2021-01-13 ENCOUNTER — Telehealth: Payer: Self-pay | Admitting: Internal Medicine

## 2021-01-13 NOTE — Telephone Encounter (Signed)
Called and spoke with pt and he is scheduled for covid testing in [place] prior to his PFT and appt with MR on 04/28/  covid testing scheduled for 04/25

## 2021-01-15 ENCOUNTER — Ambulatory Visit (HOSPITAL_COMMUNITY)
Admission: RE | Admit: 2021-01-15 | Discharge: 2021-01-15 | Disposition: A | Discharge status: Home / Self Care | Payer: Medicare Other | Source: Ambulatory Visit | Attending: Internal Medicine | Admitting: Internal Medicine

## 2021-01-15 ENCOUNTER — Other Ambulatory Visit: Payer: Self-pay

## 2021-01-15 DIAGNOSIS — M069 Rheumatoid arthritis, unspecified: Secondary | ICD-10-CM | POA: Diagnosis not present

## 2021-01-15 DIAGNOSIS — J8410 Pulmonary fibrosis, unspecified: Secondary | ICD-10-CM | POA: Insufficient documentation

## 2021-01-15 DIAGNOSIS — D849 Immunodeficiency, unspecified: Secondary | ICD-10-CM | POA: Diagnosis not present

## 2021-01-15 MED ORDER — FAMOTIDINE IN NACL 20-0.9 MG/50ML-% IV SOLN: 20.0000 mg | Freq: Once | INTRAVENOUS | Status: DC | PRN

## 2021-01-15 MED ORDER — DIPHENHYDRAMINE HCL 50 MG/ML IJ SOLN: 50.0000 mg | Freq: Once | INTRAMUSCULAR | Status: DC | PRN

## 2021-01-15 MED ORDER — TIXAGEVIMAB (PART OF EVUSHELD) INJECTION
150.0000 mg | Freq: Once | INTRAMUSCULAR | Status: AC
  Administered 2021-01-15: 150 mg via INTRAMUSCULAR
  Filled 2021-01-15: qty 1.5

## 2021-01-15 MED ORDER — CILGAVIMAB (PART OF EVUSHELD) INJECTION
150.0000 mg | Freq: Once | INTRAMUSCULAR | Status: AC
  Administered 2021-01-15: 150 mg via INTRAMUSCULAR
  Filled 2021-01-15: qty 1.5

## 2021-01-15 MED ORDER — ALBUTEROL SULFATE HFA 108 (90 BASE) MCG/ACT IN AERS
2.0000 | INHALATION_SPRAY | Freq: Once | RESPIRATORY_TRACT | Status: DC | PRN
  Filled 2021-01-15: qty 6.7

## 2021-01-15 MED ORDER — METHYLPREDNISOLONE SODIUM SUCC 125 MG IJ SOLR: 125.0000 mg | Freq: Once | INTRAMUSCULAR | Status: DC | PRN

## 2021-01-15 MED ORDER — EPINEPHRINE 0.3 MG/0.3ML IJ SOAJ: 0.3000 mg | Freq: Once | INTRAMUSCULAR | Status: DC | PRN

## 2021-01-23 DIAGNOSIS — M0589 Other rheumatoid arthritis with rheumatoid factor of multiple sites: Secondary | ICD-10-CM | POA: Diagnosis not present

## 2021-01-23 DIAGNOSIS — E1065 Type 1 diabetes mellitus with hyperglycemia: Secondary | ICD-10-CM | POA: Diagnosis not present

## 2021-01-23 DIAGNOSIS — I10 Essential (primary) hypertension: Secondary | ICD-10-CM | POA: Diagnosis not present

## 2021-01-23 DIAGNOSIS — E782 Mixed hyperlipidemia: Secondary | ICD-10-CM | POA: Diagnosis not present

## 2021-01-28 DIAGNOSIS — M0589 Other rheumatoid arthritis with rheumatoid factor of multiple sites: Secondary | ICD-10-CM | POA: Diagnosis not present

## 2021-01-30 ENCOUNTER — Other Ambulatory Visit (HOSPITAL_COMMUNITY): Payer: Self-pay

## 2021-02-10 ENCOUNTER — Telehealth: Payer: Self-pay | Admitting: Internal Medicine

## 2021-02-10 NOTE — Telephone Encounter (Signed)
Called and spoke with patient. I advised him of MR's recs. I offered to send a RX to his pharmacy since we do not have samples of Incruse and have not any samples since early 2021.   We do have samples of Breztri, Trelegy 100 & 200, Spiriva and Stiolto.

## 2021-02-10 NOTE — Telephone Encounter (Signed)
Called and spoke with patient, informed him that we did not have any samples of [name] Pressair. He did not want a prescription sent into the pharmacy due to the cost of medication. Patient was wondering If there is something else he could take?  MR please advise  Thanks

## 2021-02-10 NOTE — Telephone Encounter (Signed)
  He can try incruse daily   Allergies  Allergen Reactions  . Bactrim [Sulfamethoxazole-Trimethoprim] Shortness Of Breath    Patient states "got short winded"  . Pirfenidone Rash  . Spiriva Respimat [Tiotropium Bromide Monohydrate] Rash  . Ofev [Nintedanib]

## 2021-02-11 MED ORDER — INCRUSE ELLIPTA 62.5 MCG/INH IN AEPB: 1.0000 | INHALATION_SPRAY | Freq: Every day | RESPIRATORY_TRACT | 1 refills | Status: DC

## 2021-02-11 NOTE — Telephone Encounter (Signed)
He reacted to Spiriva.  Therefore no Spiriva.  Therefore no Stiolto  Plan -Please send Incruse prescription to see how he tolerates

## 2021-02-11 NOTE — Telephone Encounter (Signed)
Called and spoke with patient, advised of recommendations per Dr. Chase Caller.  Verified pharmacy with patient, script sent.

## 2021-02-12 ENCOUNTER — Telehealth: Payer: Self-pay | Admitting: Internal Medicine

## 2021-02-12 DIAGNOSIS — M0589 Other rheumatoid arthritis with rheumatoid factor of multiple sites: Secondary | ICD-10-CM | POA: Diagnosis not present

## 2021-02-12 MED ORDER — STIOLTO RESPIMAT 2.5-2.5 MCG/ACT IN AERS: 2.0000 | INHALATION_SPRAY | Freq: Every day | RESPIRATORY_TRACT | 0 refills | Status: DC

## 2021-02-12 NOTE — Telephone Encounter (Signed)
Called and spoke with patient, advised him that he will need to call his insurance company to find out what inhalers are on the preferred list.  Once he has that list if he can call the office and let us know so we can get that list to Dr. Chase Caller so he can choose another inhaler.  I told him that I will put a sample up front for him until we can get an inhaler that his insurance will cover sent in to his pharmacy.  He verbalized understanding.  Will await the list from the patient.

## 2021-02-12 NOTE — Addendum Note (Signed)
Addended by: Vanessa Barbara on: 02/12/2021 03:34 PM   Modules accepted: Orders

## 2021-02-12 NOTE — Telephone Encounter (Signed)
[facility] LVMTCB x 1 ?

## 2021-02-12 NOTE — Telephone Encounter (Signed)
Closing encounter d/t duplication.  See previous encounter.

## 2021-02-20 ENCOUNTER — Ambulatory Visit: Payer: Medicare Other | Admitting: Internal Medicine

## 2021-02-24 ENCOUNTER — Other Ambulatory Visit (HOSPITAL_COMMUNITY)
Admission: RE | Admit: 2021-02-24 | Discharge: 2021-02-24 | Disposition: A | Discharge status: Home / Self Care | Payer: Medicare Other | Source: Ambulatory Visit | Attending: Internal Medicine | Admitting: Internal Medicine

## 2021-02-24 DIAGNOSIS — Z20822 Contact with and (suspected) exposure to covid-19: Secondary | ICD-10-CM | POA: Insufficient documentation

## 2021-02-24 DIAGNOSIS — Z01812 Encounter for preprocedural laboratory examination: Secondary | ICD-10-CM | POA: Diagnosis not present

## 2021-02-25 LAB — SARS CORONAVIRUS 2 (TAT 6-24 HRS): SARS Coronavirus 2: NEGATIVE

## 2021-02-27 ENCOUNTER — Encounter: Payer: Self-pay | Admitting: Internal Medicine

## 2021-02-27 ENCOUNTER — Ambulatory Visit: Payer: Medicare Other | Admitting: Internal Medicine

## 2021-02-27 ENCOUNTER — Encounter: Payer: Medicare Other | Admitting: *Deleted

## 2021-02-27 ENCOUNTER — Other Ambulatory Visit: Payer: Self-pay

## 2021-02-27 ENCOUNTER — Ambulatory Visit (INDEPENDENT_AMBULATORY_CARE_PROVIDER_SITE_OTHER): Payer: Medicare Other | Admitting: Internal Medicine

## 2021-02-27 VITALS — BP 114/58 | HR 87 | Temp 98.5°F | Ht 66.0 in | Wt 172.0 lb

## 2021-02-27 DIAGNOSIS — J8489 Other specified interstitial pulmonary diseases: Secondary | ICD-10-CM

## 2021-02-27 DIAGNOSIS — J8410 Pulmonary fibrosis, unspecified: Secondary | ICD-10-CM

## 2021-02-27 DIAGNOSIS — R053 Chronic cough: Secondary | ICD-10-CM | POA: Diagnosis not present

## 2021-02-27 DIAGNOSIS — J9611 Chronic respiratory failure with hypoxia: Secondary | ICD-10-CM

## 2021-02-27 DIAGNOSIS — M359 Systemic involvement of connective tissue, unspecified: Secondary | ICD-10-CM

## 2021-02-27 DIAGNOSIS — I2723 Pulmonary hypertension due to lung diseases and hypoxia: Secondary | ICD-10-CM

## 2021-02-27 DIAGNOSIS — Z006 Encounter for examination for normal comparison and control in clinical research program: Secondary | ICD-10-CM

## 2021-02-27 NOTE — Patient Instructions (Signed)
Spirometry/DLCO performed today.

## 2021-02-27 NOTE — Patient Instructions (Addendum)
Interstitial lung disease due to connective tissue disease ([name]) Chronic respiratory failure with hypoxia ([name])  -Glad you are stable.  Pulmonary function test shows some improvement in the recovery from pulmonary fibrosis flareup or beneficial effects of treprostinil -At this point in time no antifibrotic's because of side effects -Only immunomodulator therapy for rheumatoid arthritis which we hope will help your interstitial lung disease -   Plan -Supportive care with oxygen therapy and physical conditioning -Immunomodulator therapy through the rheumatologist -Continue participation in ILD-pro registry --If there are other research protocols for you we will advise you   WHO group 3 pulmonary arterial hypertension ([name])  -You have this based on right heart catheterization in January 2022 - you are inhaled treprostinil since late February 2022/early March 2022 -I believe this is helping you as evidenced by improved  improved pulmonary function test  Plan -  continue inhaled treprostinil through [name]   Follow-up -In 4 months do spirometry and DLCO -Return to see [name] for standard of care visit in 30-minute slot in 4 months

## 2021-02-27 NOTE — Research (Signed)
Title: Chronic Fibrosing Interstitial Lung Disease with Progressive Phenotype Prospective Outcomes (ILD-PRO) Registry   Protocol #: IPF-[id number], Clinical Trials # [id number], Sponsor: [employer]  Protocol Version Amendment 4 dated 12Sep2019 and confirmed current on 02/27/2021 Consent Version for today's visit date of 02/27/2021  is [facility] IRB Approved Version 06 Oct 2018 Revised 06 Oct 2018  Objectives:  [name] Kitchen Describe current approaches to diagnosis and treatment of chronic fibrosing ILDs with progressive phenotype  . Describe the natural history of chronic fibrosing ILDs with progressive phenotype  . Assess quality of life from self-administered participant reported questionnaires for each disease group  . Describe participant interactions with the healthcare system, describe treatment practices across multiple institutions for each disease group  . Collect biological samples linked to well characterized chronic fibrosing ILDs with progressive phenotype to identify disease biomarkers  . Collect data and biological samples that will support future research studies.                                            Key Inclusion Criteria: Willing and able to provide informed consent  Age ? 30 years  Diagnosis of a non-IPF ILD of any duration, including, but not limited to Idiopathic Non-Specific Interstitial Pneumonia (INSIP), Unclassifiable Idiopathic Interstitial Pneumonias (IIPs), Interstitial Pneumonia with Autoimmune Features (IPAF), Autoimmune ILDs such as Rheumatoid Arthritis (RA-ILD) and Systemic Sclerosis (SSC-ILD), Chronic Hypersensitivity Pneumonitis (HP), Sarcoidosis or Exposure-related ILDs such as asbestosis.  Chronic fibrosing ILD defined by reticular abnormality with traction bronchiectasis with or without honeycombing confirmed by chest HRCT scan and/or lung biopsy.  Progressive phenotype as defined by fulfilling at least one of the criteria below of fibrotic changes  (progression set point) within the last 24 months regardless of treatment considered appropriate in individual ILDs:  . decline in FVC % predicted (% pred) based on >10% relative decline  . decline in FVC % pred based on ? 5 - <10% relative decline in FVC combined with worsening of respiratory symptoms as assessed by the site investigator  . decline in FVC % pred based on ? 5 - <10% relative decline in FVC combined with increasing extent of fibrotic changes on chest imaging (HRCT scan) as assessed by the site investigator  . decline in DLCO % pred based on ? 10% relative decline  . worsening of respiratory symptoms as well as increasing extent of fibrotic changes on chest imaging (HRCT scan) as assessed by the site investigator independent of FVC change.    Key Exclusion Criteria: Malignancy, treated or untreated, other than skin or early stage prostate cancer, within the past 5 years  Currently listed for lung transplantation at the time of enrollment  Currently enrolled in a clinical trial at the time of enrollment in this registry    Clinical Research Coordinator / Research RN note : This visit for Subject [name] with DOB: September 25, 1946 on 02/27/2021 for the above protocol is Visit/Encounter #2  and is for purpose of research.   The consent for this encounter is under Protocol Version Amendment 4 (Version Date: 14 July 2018), Consent- [facility] IRB Approved Version 06 Oct 2018 Revised 06 Oct 2018 and is currently IRB approved.   Subject expressed continued interest and consent in continuing as a study subject. Subject confirmed that there was no change in contact information (e.g. address, telephone, email). Subject thanked for participation in research and contribution to  science.    During this visit on 02/27/2021, the subject completed the blood work and questionnaires per the above referenced protocol.  Please refer to the subject's paper source binder for further details.  Signed  by  Lazaro Arms Clinical Research Coordinator [facility]  [place], [place] 12:49 PM 02/27/2021

## 2021-02-27 NOTE — Progress Notes (Signed)
Spirometry/DLCO performed today.

## 2021-02-27 NOTE — Progress Notes (Signed)
OV 01/01/2017  Chief Complaint  Patient presents with  . Follow-up    Pt here after PFT. Pt states overall his breathing is unchanged since last OV. Pt c/o occ dry cough. Pt deneis CP/tightness and f/c/s.    Follow-up interstitial lung disease UIP pattern associated with rheumatoid arthritis and immune suppression. Last visit in March 2017. He doesn't that overall he has been stable. He has a new rheumatologist but still at [facility] [name]. His wife also says he is stable. However as he started talking to them his wife slowly started telling me that for the last few to several months he is wheezing more than usual and is a little more short of breath than usual. However he denied it. This is compliant with his medications. [name] not on any inhaler therapy. He uses oxygen only at night. Previous CT scans of the chest in September 2016 in March 2017 did not report any associated emphysema. His most recent pulmonary function test is shown that on Combivent. I personally visualized the graph: [name] Kitchen Forced vital capacity is stable with a diffusion capacity seems to decline over time. Walking desaturation test 185 feet 3 laps on room air: He desaturated to 84%. And then we walked him on 2 L oxygen he did not desaturate.   OV 06/23/2017  Chief Complaint  Patient presents with  . Follow-up    pt here after CT in 04/2017. Pt states his breathing is unchanged since last OV. Pt c/o interemittent dry cough. Pt denies CP/tightness and f/c/s.      Follow-up interstitial lung disease UIP pattern associated with rheumatoid arthritis and on Imuran. Last visit in March 2018 I was concerned about progressive interstitial lung disease because he started desaturating on room air and his FVC show decline. Currently rated a high-resolution CT chest that confirmed progressive lung disease. We walked him 185 feet 3 laps on room air and he desaturated at third lap to 89%. This is very  similar to previous visit suggesting that  that his decline has been more gradual over the last 1 year. Compared to last visit he feels stable. His wife feels the same. [name] aware of his worsening fibrosis and the potential need to change anti fibrotics  OV 09/27/2017  Chief Complaint  Patient presents with  . Follow-up    PFT done today. Pt states that he thinks his breathing has become worse. Has complaints of SOB with exertion, occ. dry cough. Denies any CP. States that he does wear O2 at night, DME: [facility].     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  Patient is on Imuran.  [name] having progressive disease this year 2018.  He told my certified medical assistant that he is having progressive shortness of breath but to me he tells me that he is stable.  As always he is a very poor historian.  Wife gives a better history.  He has oxygen at home for the night which she uses but does not use his exertional small portable system even though this helps him.  They do admit that dyspnea is progressive.  In fact pulmonary function test today shows progressive disease.  He also seem to desaturate a little bit more easily.  He continues to be on Imuran.  In talking to him it appears his rheumatologist has now started him on Rituxan.  He has had 2 doses 2 weeks apart with the most  recent one being mid November 2018.  The next dose is at mid May 2019 which is 51-monthinterval.  He says he is up-to-date with his flu shot but is not had a conversation with his primary care physician about the new shingles vaccine that is inactivated vaccine.  [name] not having much of joint issues.  [name] aware that the Rituxan is specifically designed to slow down the progression of pulmonary fibrosis.  Walking desaturation test 185 feet x3 laps on room air with a forehead probe.  Resting heart rate was 95/min.  Final heart rate 108/min.  Resting pulse ox was 95%.  By the time he  finished his second lap he desaturated to 84%.  He was then placed on 2 L nasal cannula and he was able to maintain his saturation to 95%.   OV 12/20/2017  Chief Complaint  Patient presents with  . Follow-up    follow up PFT,SOB with activity/exertion   [name] is here for follow-up because of his progressive UIP pattern pulmonary fibrosis related to rheumatoid arthritis.  At his last visit he was started on Rituxan by rheumatology.  Since then we started him on portable oxygen but he says he is not using it because he does not like to use it.  He does use oxygen at night which is helping.  His next dose of Rituxan is in May 2019.  Both he and his wife attest that since the start of Rituxan his symptoms are stable particularly shortness of breath is not any worse.  In fact pulmonary function test today shows continued ongoing stability with FVC and DLCO would suggest that the Rituxan might be helping him.  There are no new issues.  [name] here to have a shingles vaccine.  I told him about this last visit to talk to primary care physician but he does not remember this.   IMPRESSION: June 2018 1. Continued interval progression of fibrotic interstitial lung disease with extensive honeycombing, asymmetrically involving the right lung, with mild basilar predominance, diagnostic of usual interstitial pneumonia (UIP) . 2. Stable mild reactive mediastinal lymphadenopathy. 3. Aortic atherosclerosis.  One vessel coronary atherosclerosis.   Electronically Signed   By: [name]   On: 04/09/2017 10:51   OV 08/01/2018  Subjective:  Patient ID: LDereon Corkery male , DOB: [birth year], age 75y.o. , MRN: [id number], ADDRESS: [address] [place] [id number]  08/01/2018 -   Chief Complaint  Patient presents with  . Follow-up    PFT today, increased coughing (non-productive)    Follow-up interstitial lung disease UIP pattern associated with rheumatoid arthritis. Rx Rituxan since approx  Oct/Nov 2018. Background Immuran  HPI LKeyandreScales 75y.o. -presents as usual with his wife.  He tells me that he continues on background Imuran.  [name] also on scheduled Rituxan.  He has an infusion coming up in a month or so.  This is according to his wife.  He continues to be a poor historian.  As best as I can gather from both of them is that his shortness of breath is actually a little bit better ever since starting Rituxan..  In fact his PFTs show continued improvement/stability.  With his FVC and DLCO.  However he tells me that his cough might be a little bit worse.  In talking to him it appears that is only mildly worse.  It happens mostly at night when he lies down.  He coughs a  few times and then it does not bother him.  The cough is dry.  He denies any postnasal drip or acid reflux disease.  He does not have any fever or sputum production.       OV 10/06/2018  Subjective:  Patient ID: Gatsby Chismar, male , DOB: [birth year] , age 25 y.o. , MRN: [id number] , ADDRESS: [address] [place] [address]   10/06/2018 -   Chief Complaint  Patient presents with  . Follow-up    f/u pulmonary fibrosis, no symptoms, complaints, breathing doind well     HPI Raford Dao 75 y.o. -returns for ILD follow-up in the setting of rheumatoid arthritis.  This time he is not here with his wife who has other schedule conflicts.  He tells me overall he stable.  He has now started Rituxan.  As again he is a poor historian.  I have him starting Rituxan a year ago but he told me that it was only started recently and has had only 1 infusion.  He tells me that he is stable.  His cough is well controlled with this anticholinergic he is using nighttime oxygen.  He says he is compliant with his medications.  Last visit I told him to inquire about using Bactrim prophylaxis in the setting of B-cell depletion in the setting of Rituxan.  He has no idea recollection of this conversation.  So I did tell him that we will check  his G6PD and discuss directly with his rheumatologist about starting Bactrim prophylaxis.  [name] okay with this plan.  Overall dyspnea and cough are stable and it is mild.  Walking desaturation test shows stability.     OV 06/29/2019  Subjective:  Patient ID: Kalden Wanke, male , DOB: 1945-11-10 , age 27 y.o. , MRN: [id number] , ADDRESS: [address] [place] [address]   Follow-up interstitial lung disease UIP pattern associated with rheumatoid arthritis. Rx Rituxan since approx Oct/Nov 2018. On Background Immuran, prednisone and dapsone for pcp prophylaxois = high risk medication use   06/29/2019 - ILD- RA  followup    HPI Cian Cauble 75 y.o. -presents for follow-up.  I personally saw him December 2019 before the onset of the pandemic.  He tells me that he is doing fair.  He feels he is overall stable.  He continues to be on Rituxan.  He tells me the last dose of this infusion was in May 05, 2019.  This is managed by rheumatology.  In addition he is on Imuran, prednisone.  Also by middle of year my nurse practitioner started him on dapsone for PCP prophylaxis.  He did have a high-resolution CT chest that shows UIP findings and stability since 2018.  However, he did desaturate with exertion when he saw the nurse practitioner just over a month ago.  This is a new finding as documented below.  We walked him again today and again found his desaturation with exertion.  It required 5 L to correct.  In past he has done well with RA at times but at times required 2L. He denies any fever or sputum production.  His previous echocardiogram was in 2013.  Most recent CT scan of the chest notes coronary artery calcification   Noted WBC going down after dapsine start Results for LIZANDRO, SPELLMAN (MRN [id number]) as of 06/29/2019 10:19  Ref. Range 05/30/2019 10:17 06/07/2019 09:07 06/13/2019 08:59 06/20/2019 09:50  WBC Latest Ref Range: 4.0 - 10.5 K/uL 8.8 6.2 4.3 3.4 (L)  IMPRESSION: HRCT July 2020 1. No  significant interval change in severe pulmonary fibrosis, which is heterogeneously distributed and most severe in the right lung base, featuring extensive honeycombing and mild traction bronchiectasis. Although findings are not significantly changed compared to most recent examination dated 2018, they are clearly progressed over time in comparison to priors dating back to 2013. Findings remain in a UIP pattern by ATS pulmonary fibrosis criteria  2.  Coronary artery disease.   Electronically Signed   By: Eddie Candle [name]   On: 05/12/2019 13:14  ROS - per HPI   OV 01/04/2020  Subjective:  Patient ID: [name], male , DOB: 01/03/1946 , age 26 y.o. , MRN: [id number] , [address] [place] [address]   01/04/2020 -   Chief Complaint  Patient presents with  . Follow-up    Pt states he has been doing good since last visit. States he still has an occ cough.    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 Rx Rituxan since approx Oct/Nov 2018. On Background Immuran, prednisone and  = high risk medication use     HPI Leverne Murry 75 y.o. -returns for follow-up.  Last seen in the summer 2020.  Overall he is doing stable.  He continues with his immunosuppressive medications through rheumatology.  This include Imuran prednisone and Rituxan.  He had his infusion of Rituxan this week.  He feels stable but his pulmonary function test done for this visit shows a decline.  It is documented below his walking desaturation test also is a decline from the past but similar to last visit 6 months or so ago.  His main symptom is chronic cough.  [name] labeled it as a level 3 out of 5 but it appears it might actually be more severe.  [name] asking for palliative relief of cough.  His last liver function test with [place] was in the summer 2020.  It appears per review of the chart in the interim he has seen cardiology Dr. Percival Spanish for his coronary artery  calcification and is on expectant follow-up for that.  No stress test has been recommended because of the lack of chest pain.  His wife who is usually here with him is not here today.  He said she slept in.  [name] on MDI - no emphysema noted in prior CT    ROS - per HPI  OV 02/15/2020  Subjective:  Patient ID: Rohil Lesch, male , DOB: 24-Mar-1946 , age 71 y.o. , MRN: [id number] , ADDRESS: [place] [id number]   02/15/2020 -   Chief Complaint  Patient presents with  . Follow-up    Follow-up interstitial lung disease UIP pattern associated with rheumatoid arthritis.   - Rx Rituxan since approx Oct/Nov 2018. On Background Immuran, prednisone and  = high risk medication use  - started ofev mid -  end of march 2021 for progressive disease    HPI Maximo Mullenax 75 y.o. -he has progressive ILD because of rheumatoid arthritis UIP pattern.  Last visit started him on nintedanib.  [name] now here to follow-up for tolerance and monitoring of nintedanib because this requires intensive therapeutic monitoring.  [name] also immunosuppressed.  He tells me that he is now started nintedanib approximately 1 month ago.  [name] tolerating it fine without any nausea vomiting diarrhea.  Last visit he took the consent form for the ILD-pro registry study.  [name]  agreed now to participate in it.  He meets inclusion exclusion criteria because of progressive disease.  At this point in time he is got minimal symptoms.  He desaturated as before.  However he is not compliant with his portable oxygen with exertion.  His cough is improved because of Hycodan.   OV 05/01/2020  Subjective:  Patient ID: Menelik Mcfarren, male , DOB: 1946/07/18 , age 69 y.o. , MRN: [id number] , ADDRESS: [place] [address]   05/01/2020 -   Chief Complaint  Patient presents with  . Follow-up    pt is here to to disuss ild    OV 05/30/2020   Subjective:  Patient ID: [name], male , DOB:  May 07, 1946, age 57 y.o. years. , MRN: [id number],  ADDRESS: [address] [place] [address] PCP  Merrilee Seashore, MD Providers : Treatment Team:  Attending Provider: Brand Males, MD   Chief Complaint  Patient presents with  . Follow-up    Pt states he has been doing good since last visit and states his breathing is about the same.     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   - Rx Rituxan since approx Oct/Nov 2018. On Background Immuran, prednisone and  = high risk medication use  - started ofev mid -  end of march 2021 for progressive disease -> changed to [facility]  June 2021    HPI Delron Daniele 75 y.o. -presents for follow-up.  This time he has brought his wife.  Have not seen the wife in a long time.  She appears to have lost weight.  She says she is dealing with her own health issues of unexplained weight loss.  She says she is abreast of his health issues when he returns home after visits.  However patient continues to be stoic and always states he is feeling well.  Last time he said he was feeling well but had high level of symptoms.  This time also he says he is feeling well and gaining weight and has marked very low level of symptoms.  Therefore overall his history is unreliable.  In the interim he has had his right heart catheterization.  He had nonobstructive coronary artery disease his.  His mean pulmonary artery pressure was elevated at 25.  Wedge pressure could not be elucidated.  His peripheral vascular resistance appears greater than 3.  Therefore he qualifies for nebulized treprostinil.  However he still in the uptake with his pirfenidone.  [name] not applying sunscreen and he says he will.  [name] tolerating pirfenidone well.  He has gained weight no nausea no vomiting no diarrhea.  He uses 4 L of pulsed oxygen at rest.  However at room air at rest he was normal.  He desaturated only after walking 2 laps.    OV  08/28/2020   Subjective:  Patient ID: Duy Lemming, male , DOB: 1946/04/14, age 76 y.o. years. , MRN: [id number],  ADDRESS: [address] [place] [address] PCP  Merrilee Seashore, MD Providers : Treatment Team:  Attending Provider: Brand Males, MD Patient Care Team: Merrilee Seashore, MD as PCP - General (Internal Medicine) Hennie Duos, MD as Consulting Physician (Rheumatology) Lauraine Rinne, NP as Nurse Practitioner (Pulmonary Disease)    Chief Complaint  Patient presents with  . Follow-up    coughs when starts eating.      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   -  Rx Rituxan since approx Oct/Nov 2018. On Background Immuran, prednisone and  = high risk medication use  - started ofev mid -  end of march 2021 for progressive disease -> changed to [facility]  June 2021 -> stopped sept 2021 due to rash     HPI Tamel Rosen 75 y.o. -returns for follow-up.  At this visit he was supposed to do spirometry and DLCO but he has not.  He presents by himself.  It is unclear why he did not do spirometry and DLCO.  As usual he is stoic and does not give much of a history.  His symptom score appears stable.  His weight appears stable.  He uses 4 L of oxygen.  Between the last 2 visits he made phone calls suggesting initially that the pirfenidone was causing a rash in his upper arm but later the phone call said Spiriva.  We indicated that he should stop the Spiriva.  Today he tells me that he is actually continuing his Spiriva.  He tells me that the rash was caused by the pirfenidone/Esbriet.  Initially did not seem sure it was because of the [place] but latter when I repeated questions in multiple different ways he indicated it was desperate causing the rash therefore he stopped it.  He has been 1 month since he stopped it and the rash is disappeared.  The rash was in a nonexposed area in the right upper extremity.  He has had his Covid  vaccine but has not had his booster.  He continues to be on Rituxan which is tolerating well.  I did indicate to him that in the absence of B cells he might not have antibodies and he should get his Covid booster.  We will check Covid IgG as follow-up post booster needs continued intensive monitoring due to high complex status and immunosuppression    SYMPTOM SCALE - ILD 06/29/2019  01/04/2020  02/15/2020  05/01/2020  05/30/2020  08/28/2020   O2 use Uses 3-5L with exertion yuses 3-5L with exertion Non compliant 168# and 4L with exertion - on esbriet 172# on esbriet - 4L at rest  - filled bu him and wife 174# 4L o2  Shortness of Breath 0 -> 5 scale with 5 being worst (score 6 If unable to do)       At rest 3 0 0 4 0 0  Simple tasks - showers, clothes change, eating, shaving 2 1 0 5 0 0  Household (dishes, doing bed, laundry) 2 1 0 5 0   Shopping _0 0 0  Walking level at own pace _1 0 0  Walking up Stairs _2 Total (40 - 48) Dyspnea Score _3 How bad is your cough? _4 How bad is your fatigue 2 1 0 4 0 0  nasuea    4 0 0  vomit    0 0 0  diarrhea  0 0 0 0 0  anxiety  0 0 0 0 0  deipression  0 0 0 0 0       Simple office walk 08/01/2018 -  185 feet x  3 laps goal with forehead probe at [employer]  10/06/2018 250 feet x 3 laps 06/29/2019  01/04/2020  02/15/2020  05/30/2020   O2 used _5  raoom air after turing o2  off fx 5 mn  Number laps completed 3 3 Aim for 3 1 Did only 1 of 3 Did all 3 Did only 2 laps  Comments about pace normal normal  avg pace avg opae avg pagce  Resting Pulse Ox/HR 100% and 80/min 100% and 81/min 94% and 96/min 96% and 101/min 99% and 98/min 96% and 94/min  Final Pulse Ox/HR 94% and 114/min 91% and 111/min 86% at first lap, HR 131 86% and 118/min 88% and 123/min 83% and 125/,n  Desaturated </= 88% no no  yes yes   Desaturated <= 3% points yes yes  yes yes   Got Tachycardic >/= 90/min yes  yes  yes yes   Symptoms at end of test x x  Mild dyspnea dyspnea   Miscellaneous comments x x Needed 5L [place] to correct       Results for MONTREAL, STEIDLE (MRN [id number]) as of 01/04/2020 09:36  Ref. Range 01/23/2014 15:38 10/01/2014 11:24 07/29/2015 11:09 12/18/2016 09:34 09/27/2017 09:33 12/20/2017 11:29 08/01/2018 09:39 12/26/2019 08:54 04/08/20  FVC-Pre Latest Units: L 2.18 2.05 2.17 2.21 2.06 2.10 2.09 1.96 1.73  FVC-%Pred-Pre Latest Units: % 66 62 66 68 64 65 69 61 54%   Results for CRYSTAL, ELLWOOD (MRN [id number]) as of 01/04/2020 09:36  Ref. Range 01/23/2014 15:38 10/01/2014 11:24 07/29/2015 11:09 12/18/2016 09:34 09/27/2017 09:33 12/20/2017 11:29 08/01/2018 09:39 12/26/2019 08:54 6/7  DLCO unc Latest Units: ml/min/mmHg 17.93 16.86 16.72 13.81 12.14 12.30 13.24 13.14 9/15  DLCO unc % pred Latest Units: % 66 62 62 51 45 45 52 58 41%       OV 12/03/2020  Subjective:  Patient ID: [name], male , DOB: [birth year] , age 57 y.o. , MRN: [id number] , ADDRESS: [address] [place] [address] PCP Merrilee Seashore, MD Patient Care Team: Merrilee Seashore, MD as PCP - General (Internal Medicine) Minus Breeding, MD as PCP - Cardiology (Cardiology) Hennie Duos, MD as Consulting Physician (Rheumatology) Lauraine Rinne, NP as Nurse Practitioner (Pulmonary Disease)  This Provider for this visit: Treatment Team:  Attending Provider: Brand Males, MD    12/03/2020 -   Chief Complaint  Patient presents with  . Follow-up    F/U after hospital stay. States he has been feeling well since being home.      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   - Rx Rituxan since approx Oct/Nov 2018. On Background Immuran, prednisone and  = high risk medication use  - started ofev mid -  end of march 2021 for progressive disease -> changed to [facility]  June 2021 -> stopped sept 2021 due to rash  -Participant in ILD-pro registry study   Pulm HTn [place] 11/18/20 v- Dr  Glori Bickers  Repeat Randall yesterday c/w mild to moderate pulmonary HTN with high cardiac output. PA = 59/18 (34). PCW = 10. PVR = 3.1  - Given PAH in setting of IPF, a trial of inhaled treprostinil seems reasonable    12/03/2020 [name] Palau returns for follow-up.  After my last visit in October 2021 he decompensated in January 2022 and admitted to the hospital intensive care unit needing BiPAP for respiratory failure in January 2022 last month.  I was in touch with the intensive care physician Dr. Doyne Keel and heart failure specialist Dr. Haroldine Laws during this time.  They did a right heart catheterization in the results are above.  Patient does have WHO group 3 pulmonary hypertension.  Inhaled treprostinil is indicated.  Dr.  Bensimhon's pharmacist Audry Riles working on this.  I just touch base with Dr. Haroldine Laws about this.  Patient is here with his wife.  He says he is much improved.  [name] back to his 3 L nasal cannula at baseline.  He reports having had his Covid booster in October 2021.  He had his booster after we found that his Covid IgG was negative.  Of note he is on Rituxan which suppresses IgG production.  He and his wife are interested in future therapeutic options.  [name] a participant in the ILD-pro registry.         PFT  PFT Results Latest Ref Rng & Units 04/08/2020 12/26/2019 08/01/2018 12/20/2017 09/27/2017 12/18/2016 07/29/2015  FVC-Pre L 1.73 1.96 2.09 2.10 2.06 2.21 2.17  FVC-Predicted Pre % 54 61 69 65 64 68 66  FVC-Post L - - - - - 2.28 2.20  FVC-Predicted Post % - - - - - 70 67  Pre FEV1/FVC % % 90 86 89 88 88 86 86  Post FEV1/FCV % % - - - - - 88 89  FEV1-Pre L 1.55 1.69 1.87 1.84 1.81 1.92 1.88  FEV1-Predicted Pre % 66 72 84 77 75 80 77  FEV1-Post L - - - - - 2.01 1.95  DLCO uncorrected ml/min/mmHg 8.10 13.14 13.24 12.30 12.14 13.81 16.72  DLCO UNC% % 36 58 52 45 45 51 62  DLCO corrected ml/min/mmHg 9.15 - - - 13.07 14.51 -  DLCO COR %Predicted % 41 - - - 48 53 -   DLVA Predicted % 68 88 95 80 78 90 119  TLC L - - - - - - 3.91  TLC % Predicted % - - - - - - 62  RV % Predicted % - - - - - - 63     OV 02/27/2021  Subjective:  Patient ID: [name], male , DOB: 10/16/1946 , age 75 y.o. , MRN: [id number] , ADDRESS: [address] [place] [address] PCP Merrilee Seashore, MD Patient Care Team: Merrilee Seashore, MD as PCP - General (Internal Medicine) Minus Breeding, MD as PCP - Cardiology (Cardiology) Hennie Duos, MD as Consulting Physician (Rheumatology) Lauraine Rinne, NP as Nurse Practitioner (Pulmonary Disease)  This Provider for this visit: Treatment Team:  Attending Provider: Brand Males, MD    02/27/2021 -   Chief Complaint  Patient presents with  . Follow-up    Doing ok, breathing is stable     Follow-up interstitial lung disease UIP pattern associated with rheumatoid arthritis.   - Rx Rituxan since approx Oct/Nov 2018. On Background Immuran, prednisone and  = high risk medication use  - started ofev mid -  end of march 2021 for progressive disease -> changed to [facility]  June 2021 -> stopped sept 2021 due to rash -> not on anti-foibritc  -Participant in ILD-pro registry study   Pulm HTn [place] 11/18/20 - Dr Glori Bickers  Repeat [place] yesterday c/w mild to moderate pulmonary HTN with high cardiac output. PA = 59/18 (34). PCW = 10. PVR = 3.1  -  started tyvaso early march 2022 - through [facility] - CHF Pharmacist  Associated mild emphysema [previous intolerance to  Spiriva)  - feb 2022 On inceuse  COvid prophylassix (IgG negative Feb 2022)  - tixagevimab/cilgavimab, for pre-exposure prophylaxis  Feb 2022 -= repeat dosing March 2022  HPI Oleg Cunningham 75 y.o. -returns for follow-up.  He presents with his wife.  He endorses stability.  His symptom score shows worsening but I suspect this is unreliable because he never gives a good history.  He had pulmonary function test that shows improvement compared to the  most recent 1 but still worse compared to February 2021.  In the interim he has been started on inhaled treprostinil as of March 2022.  There is some subset analysis evidence to suggest treprostinil can moderate PFTs [and is the question being raised in the upcoming tETON trial and for IPF] he is on his immunomodulator treatment is finished COVID prophylaxis monoclonal antibody.  [name] able to participate today in the ILD Pro registry visit.     SYMPTOM SCALE - ILD 06/29/2019  01/04/2020  02/15/2020  05/01/2020  05/30/2020  08/28/2020  02/27/2021 On tyvasos  O2 use Uses 3-5L with exertion yuses 3-5L with exertion Non compliant 168# and 4L with exertion - on esbriet 172# on esbriet - 4L at rest  - filled bu him and wife 174# 4L o2 172# 4L O2  Shortness of Breath 0 -> 5 scale with 5 being worst (score 6 If unable to do)        At rest 3 0 0 4 0 0 2  Simple tasks - showers, clothes change, eating, shaving 2 1 0 5 0 0 3  Household (dishes, doing bed, laundry) 2 1 0 5 0  0  Shopping _0 0 0 0  Walking level at own pace _1 0 0 2  Walking up Stairs _2 Total (40 - 48) Dyspnea Score _3 How bad is your cough? _4 How bad is your fatigue 2 1 0 4 0 0 2  nasuea    4 0 0 0  vomit    0 0 0 0  diarrhea  0 0 0 0 0 0  anxiety  0 0 0 0 0 0  deipression  0 0 0 0 0 0       Simple office walk 08/01/2018 -  185 feet x  3 laps goal with forehead probe at [place]  10/06/2018 250 feet x 3 laps 06/29/2019  01/04/2020  02/15/2020  05/30/2020  12/03/2020 Using 3L [place] at home all time 02/27/2021 Uses 4L at home  O2 used _5  raoom air after turing o2 off fx 5 mn Room air Room air  Number laps completed 3 3 Aim for 3 1 Did only 1 of 3 Did all 3 Did only 2 laps Did only 0.5 lap desats at 0.5 of 3 l;aps  Comments about pace normal normal  avg pace avg opae avg pagce avg pace   Resting Pulse Ox/HR 100% and 80/min 100% and 81/min 94%  and 96/min 96% and 101/min 99% and 98/min 96% and 94/min 96% and HR 96 89% RA -> 95 %  Final Pulse Ox/HR 94% and 114/min 91% and 111/min 86% at first lap, HR 131 86% and 118/min 88% and 123/min 83% and 125/,n 85% and HR 124 at half lap 87% at half lap  Desaturated </= 88% no no  yes yes     Desaturated <= 3% points yes yes  yes yes     Got Tachycardic >/= 90/min yes yes  yes yes     Symptoms at end of test x x  Mild dyspnea dyspnea  Miscellaneous comments x x Needed 5L [place] to correct    Corrected with 4L [place] and did noly 2 laps      PFT  Results for CANON, GOLA (MRN [id number]) as of 02/27/2021 11:48  Ref. Range 09/27/2017 09:33 12/20/2017 11:29 08/01/2018 09:39 12/26/2019 08:54 04/08/2020 09:49  FVC-Pre Latest Units: L 2.06 2.10 2.09 1.96 1.73  FVC-%Pred-Pre Latest Units: % 64 65 69 61 54  Results for GEOGE, LAWRANCE (MRN [id number]) as of 02/27/2021 11:48  Ref. Range 09/27/2017 09:33 12/20/2017 11:29 08/01/2018 09:39 12/26/2019 08:54 04/08/2020 09:49  DLCO unc Latest Units: ml/min/mmHg 12.14 12.30 13.24 13.14 8.10  DLCO unc % pred Latest Units: % 45 45 52 58 36     Pulmonary function test February 19, 2021: Shows FVC 1.85 L / 58% and DLCO 10/45%..  Somewhat better compared to June 2021 but worse compared to February 2021    has a past medical history of Arthritis, COPD (chronic obstructive pulmonary disease) ([place]), Diabetes mellitus, Hypertension, Pneumonia, and Thyroid disease.   reports that he quit smoking about 36 years ago. His smoking use included cigarettes. He has a 5.00 pack-year smoking history. He has never used smokeless tobacco.  Past Surgical History:  Procedure Laterality Date  . KNEE ARTHROSCOPY    . NO PAST SURGERIES    . RIGHT HEART CATH N/A 11/18/2020   Procedure: RIGHT HEART CATH;  Surgeon: Jolaine Artist, MD;  Location: [facility];  Service: Cardiovascular;  Laterality: N/A;  . RIGHT/LEFT HEART CATH AND CORONARY ANGIOGRAPHY N/A 05/21/2020   Procedure: RIGHT/LEFT  HEART CATH AND CORONARY ANGIOGRAPHY;  Surgeon: Martinique, Peter M, MD;  Location: [place];  Service: Cardiovascular;  Laterality: N/A;    Allergies  Allergen Reactions  . Bactrim [Sulfamethoxazole-Trimethoprim] Shortness Of Breath    Patient states "got short winded"  . Pirfenidone Rash  . Spiriva Respimat [Tiotropium Bromide Monohydrate] Rash  . Ofev [Nintedanib]     Immunization History  Administered Date(s) Administered  . Fluad Quad(high Dose 65+) 08/12/2020  . Influenza Split 08/03/2011, 07/03/2012, 09/02/2013, 07/03/2014  . Influenza, High Dose Seasonal PF 07/30/2014, 07/15/2016, 08/02/2016, 08/02/2017, 07/25/2018, 07/04/2019  . Influenza, Quadrivalent, Recombinant, Inj, Pf 08/11/2017, 07/05/2018, 07/11/2019, 08/15/2020  . Influenza,inj,Quad PF,6+ Mos 07/29/2015  . Influenza-Unspecified 07/23/2012  . PFIZER(Purple Top)SARS-COV-2 Vaccination 01/01/2020, 01/13/2020, 02/05/2020, 08/31/2020  . Pneumococcal Conjugate-13 08/20/2015, 08/01/2018  . Pneumococcal Polysaccharide-23 11/03/2007, 12/04/2007, 01/12/2018  . Zoster Recombinat (Shingrix) 04/11/2019    Family History  Problem Relation Age of Onset  . Diabetes Mother   . Stroke Mother      Current Outpatient Medications:  .  acetaminophen (TYLENOL) 500 MG tablet, Take 1,000 mg by mouth every 6 (six) hours as needed for moderate pain or headache., Disp: , Rfl:  .  albuterol (VENTOLIN HFA) 108 (90 Base) MCG/ACT inhaler, Inhale 2 puffs into the lungs every 6 (six) hours as needed., Disp: , Rfl:  .  amLODipine (NORVASC) 10 MG tablet, Take 10 mg by mouth daily., Disp: , Rfl:  .  aspirin 81 MG tablet, Take 81 mg by mouth daily., Disp: , Rfl:  .  atorvastatin (LIPITOR) 20 MG tablet, Take 20 mg by mouth daily., Disp: , Rfl: 2 .  azaTHIOprine (IMURAN) 50 MG tablet, Take 100 mg by mouth daily. , Disp: , Rfl:  .  benzonatate (TESSALON) 100 MG capsule, Take 2 capsules (200 mg total) by mouth 3 (three) times daily as needed for  cough., Disp: 30 capsule, Rfl: 1 .  ergocalciferol (  VITAMIN D2) 1.25 MG (50000 UT) capsule, 1 capsule, Disp: , Rfl:  .  fluticasone (FLONASE) 50 MCG/ACT nasal spray, Place 2 sprays into both nostrils daily., Disp: 16 g, Rfl: 2 .  folic acid (FOLVITE) 735 MCG tablet, Take 400 mcg by mouth daily., Disp: , Rfl:  .  insulin NPH Human (NOVOLIN N) 100 UNIT/ML injection, Inject 12 Units into the skin See admin instructions. Inject 10 units every morning and 20 units every evening, Disp: , Rfl:  .  Insulin Syringe-Needle U-100 (INSULIN SYRINGE .3CC/31GX5/16") 31G X 5/16" 0.3 ML MISC, Inject 1 Syringe into the skin as directed., Disp: , Rfl:  .  levothyroxine (SYNTHROID, LEVOTHROID) 25 MCG tablet, Take 25 mcg by mouth daily., Disp: , Rfl:  .  metFORMIN (GLUCOPHAGE-XR) 500 MG 24 hr tablet, Take 2 tablets (1,000 mg total) by mouth 2 (two) times daily., Disp: , Rfl:  .  ondansetron (ZOFRAN) 4 MG tablet, Take 1 tablet (4 mg total) by mouth 3 (three) times daily as needed for nausea or vomiting., Disp: 30 tablet, Rfl: 5 .  pantoprazole (PROTONIX) 40 MG tablet, Take 40 mg by mouth daily., Disp: , Rfl:  .  predniSONE (DELTASONE) 1 MG tablet, Take 3 mg by mouth daily., Disp: , Rfl:  .  Treprostinil (TYVASO) 0.6 MG/ML SOLN, Inhale 18 mcg into the lungs 4 (four) times daily., Disp: , Rfl:  .  umeclidinium bromide (INCRUSE ELLIPTA) 62.5 MCG/INH AEPB, Inhale 1 puff into the lungs daily., Disp: 30 each, Rfl: 1      Objective:   Vitals:   02/27/21 1114  BP: (!) 114/58  Pulse: 87  Temp: 98.5 F (36.9 C)  TempSrc: Oral  SpO2: (!) 89%  Weight: 172 lb (78 kg)  Height: 5' 6" (1.676 m)    Estimated body mass index is 27.76 kg/m as calculated from the following:   Height as of this encounter: 5' 6" (1.676 m).   Weight as of this encounter: 172 lb (78 kg).  _0 @  Filed Weights   02/27/21 1114  Weight: 172 lb (78 kg)     Physical Exam  General: No distress. lean Neuro: Alert and Oriented x 3.  GCS 15. Speech normal Psych: Pleasant Resp:  Barrel Chest - no.  Wheeze - no, Crackles - yes, No overt respiratory distress CVS: Normal heart sounds. Murmurs - no Ext: Stigmata of Connective Tissue Disease - RA HEENT: Normal upper airway. PEERL +. No post nasal drip        Assessment:       ICD-10-CM   1. Chronic cough  R05.3 Pulmonary function test  2. 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 Interstitial lung disease due to connective tissue disease ([place])  J84.89    M35.9   4. Chronic respiratory failure with hypoxia (H[facility])  J96.11   5. WHO group 3 pulmonary arterial hypertension (HCC)  I27.23        Plan:     Patient Instructions  Interstitial lung disease due to connective tissue disease ([place]) Chronic respiratory failure with hypoxia ([place])  -Glad you are stable.  Pulmonary function test shows some improvement in the recovery from pulmonary fibrosis flareup or beneficial effects of treprostinil -At this point in time no antifibrotic's because of side effects -Only immunomodulator therapy for rheumatoid arthritis which we hope will help your interstitial lung disease -   Plan -Supportive care with oxygen therapy and physical conditioning -Immunomodulator therapy through the  rheumatologist -Continue participation in ILD-pro registry --If there are other research protocols for you we will advise you   WHO group 3 pulmonary arterial hypertension ([place])  -You have this based on right heart catheterization in January 2022 - you are inhaled treprostinil since late February 2022/early March 2022 -I believe this is helping you as evidenced by improved  improved pulmonary function test  Plan -  continue inhaled treprostinil through [facility]   Follow-up -In 4 months do spirometry and DLCO -Return to see Dr. Chase Caller for standard of care visit in 30-minute slot in 4 months     SIGNATURE    Dr. Brand Males, [name],  [name],  Pulmonary and Critical Care Medicine Staff Physician, [place] Director - Interstitial Lung Disease  Program  Pulmonary [place] at [place], [place], [address]  Pager: [phone], If no answer or between  15:00h - 7:00h: call [phone] Telephone: [phone]  12:06 PM 02/27/2021

## 2021-03-10 ENCOUNTER — Other Ambulatory Visit: Payer: Self-pay | Admitting: Internal Medicine

## 2021-03-10 LAB — PULMONARY FUNCTION TEST
DL/VA % pred: 67 %
DL/VA: 2.71 ml/min/mmHg/L
DLCO cor % pred: 45 %
DLCO cor: 10.01 ml/min/mmHg
DLCO unc % pred: 45 %
DLCO unc: 10.01 ml/min/mmHg
FEF 25-75 Pre: 2.82 L/sec
FEF2575-%Pred-Pre: 147 %
FEV1-%Pred-Pre: 71 %
FEV1-Pre: 1.65 L
FEV1FVC-%Pred-Pre: 117 %
FEV6-%Pred-Pre: 62 %
FEV6-Pre: 1.85 L
FEV6FVC-%Pred-Pre: 106 %
FVC-%Pred-Pre: 58 %
FVC-Pre: 1.85 L
Pre FEV1/FVC ratio: 89 %
Pre FEV6/FVC Ratio: 100 %

## 2021-03-18 ENCOUNTER — Telehealth: Payer: Self-pay | Admitting: Internal Medicine

## 2021-03-18 NOTE — Telephone Encounter (Signed)
Patient is aware of recommendations and voiced his understanding.   Dr. Chase Caller, please advise. Thanks

## 2021-03-18 NOTE — Telephone Encounter (Signed)
Spoke to patient, who stated that two weeks ago he developed a rash on his stomach, legs and arms. Rash is red, bumpy and itchy.  [name] concerned that rash is related to Tyvaso.  He has been taking tyvaso since February.  Dr. Elsworth Soho, please advise. Thanks MR is unavailable.

## 2021-03-18 NOTE — Telephone Encounter (Signed)
Stop Tyvaso until Dr. Chase Caller can get back to him about next step

## 2021-03-19 NOTE — Telephone Encounter (Signed)
When did he start treprostinil? What is the last date of inhaled treprostinil Has had the rash gone away since he stopped inhaled treprostinil? Incidence of rash according to up-to-date is 14% with injection treprostinil but less than 1% with inhalation   So I have really do not think this rash is from the treprostinil but I need input to the above questions before to make a decision recommendation  Thank you

## 2021-03-19 NOTE — Telephone Encounter (Signed)
Called and spoke with patient and advised of RM recs. Patient voiced understanding. He wanted to schedule visit for Monday 5/23. Appointment made.  Nothing further needed at this time.

## 2021-03-19 NOTE — Telephone Encounter (Signed)
Ok hold offf tyvaso and monitor  Apply vaseline or moisturizing barrier cream to skin and watch  Ensure tele visit with me or an app in few weeks and we can look at rechallenge if needed

## 2021-03-19 NOTE — Telephone Encounter (Signed)
Called and spoke with patient, he states that he started the treprostinil in mid February and the last date the he took it was on Sunday 03/16/21.  The rash is starting to clear up, but is not gone.  It still itches and he has drawn blood at the areas where he has been scratching.  Dr. Chase Caller, please advise.  Thank you.

## 2021-03-24 ENCOUNTER — Ambulatory Visit (INDEPENDENT_AMBULATORY_CARE_PROVIDER_SITE_OTHER): Payer: Medicare Other | Admitting: Adult Health

## 2021-03-24 ENCOUNTER — Other Ambulatory Visit: Payer: Self-pay

## 2021-03-24 ENCOUNTER — Encounter: Payer: Self-pay | Admitting: Adult Health

## 2021-03-24 DIAGNOSIS — J9611 Chronic respiratory failure with hypoxia: Secondary | ICD-10-CM | POA: Diagnosis not present

## 2021-03-24 DIAGNOSIS — M359 Systemic involvement of connective tissue, unspecified: Secondary | ICD-10-CM | POA: Diagnosis not present

## 2021-03-24 DIAGNOSIS — D849 Immunodeficiency, unspecified: Secondary | ICD-10-CM

## 2021-03-24 DIAGNOSIS — Z5181 Encounter for therapeutic drug level monitoring: Secondary | ICD-10-CM | POA: Diagnosis not present

## 2021-03-24 DIAGNOSIS — I2723 Pulmonary hypertension due to lung diseases and hypoxia: Secondary | ICD-10-CM

## 2021-03-24 DIAGNOSIS — J8489 Other specified interstitial pulmonary diseases: Secondary | ICD-10-CM | POA: Diagnosis not present

## 2021-03-24 NOTE — Patient Instructions (Signed)
Continue on current regimen Follow-up with cardiology as planned. Continue on oxygen 4 L Activity as tolerated Follow-up in 4 months with Dr. Chase Caller as planned and as needed

## 2021-03-24 NOTE — Progress Notes (Signed)
Virtual Visit via Telephone Note  I connected with [name] on 03/24/21 at 10:30 AM EDT by telephone and verified that I am speaking with the correct person using two identifiers.  Location: Patient: Home  Provider: Office    I discussed the limitations, risks, security and privacy concerns of performing an evaluation and management service by telephone and the availability of in person appointments. I also discussed with the patient that there may be a patient responsible charge related to this service. The patient expressed understanding and agreed to proceed.   History of Present Illness: 75 year old male former smoker followed for rheumatoid arthritis associated interstitial lung disease (UIP pattern ) and chronic respiratory failure on oxygen Pulm HTn [facility] 11/18/20 v- Dr Glori Bickers Followed by Rheumatology Dr. Jeri Lager  - On Imuran and prednisone   ILD treatment Hx :  Rx Rituxan since approx Oct/Nov 2018. On Background Immuran, prednisone and  = high risk medication use             - started ofev mid -  end of march 2021 for progressive disease- stopped Mid 2021 due to GI SE  - started on Esbriet 04/2020 -07/2020 ) stopped due to rash  -Participant in ILD -Pro Registry study  -Started on Tyvaso early March 2022   Today's telemedicine visit is a follow-up visit.  Patient has underlying rheumatoid arthritis associated interstitial lung disease with UIP pattern.  Patient has chronic respiratory failure on oxygen at home.  He has managed by rheumatology is on Imuran and low-dose prednisone.  Prednisone dose is at 3 mg daily.  Patient is on oxygen 4 L.  Says his O2 saturations have been doing well mostly about 95 to 98% on 4 L. Patient says he is actually been doing well.  He did have a rash that developed a week ago with that was red bumpy and itchy.  But patient says that it has resolved.  He did hold his Tyvaso briefly but has restarted and rash has not returned.  Patient says he  believes he is tolerating well.  And wants to continue on this.  He denies any difficulty swallowing chest pain increased cough lip or tongue swelling.  No syncope or chest pains or palpitations. Previously patient had been tried on Ofev and Esbriet was was intolerant due to side effects Patient remains independent.  Says he is able to drive.  He does do some shopping.  He uses his portable oxygen tanks when he is away from the home.  Patient says he does light activities at home is able to shower dress without difficulty.  Does do some yard work with a ride lawn more only.  Cannot walk a long distance gets very winded.  Past Medical History:  Diagnosis Date  . Arthritis   . COPD (chronic obstructive pulmonary disease) ([place])   . Diabetes mellitus   . Hypertension   . Pneumonia   . Thyroid disease     Current Outpatient Medications on File Prior to Visit  Medication Sig Dispense Refill  . acetaminophen (TYLENOL) 500 MG tablet Take 1,000 mg by mouth every 6 (six) hours as needed for moderate pain or headache.    . albuterol (VENTOLIN HFA) 108 (90 Base) MCG/ACT inhaler Inhale 2 puffs into the lungs every 6 (six) hours as needed.    [name] Kitchen amLODipine (NORVASC) 10 MG tablet Take 10 mg by mouth daily.    [name] Kitchen aspirin 81 MG tablet Take 81 mg by mouth daily.    [name] Kitchen  atorvastatin (LIPITOR) 20 MG tablet Take 20 mg by mouth daily.  2  . azaTHIOprine (IMURAN) 50 MG tablet Take 100 mg by mouth daily.     . benzonatate (TESSALON) 100 MG capsule TAKE 2 CAPSULES BY MOUTH THREE TIMES DAILY AS NEEDED FOR COUGH 90 capsule 1  . fluticasone (FLONASE) 50 MCG/ACT nasal spray Place 2 sprays into both nostrils daily. 16 g 2  . folic acid (FOLVITE) 428 MCG tablet Take 400 mcg by mouth daily.    . insulin NPH Human (NOVOLIN N) 100 UNIT/ML injection Inject 12 Units into the skin See admin instructions. Inject 10 units every morning and 20 units every evening    . Insulin Syringe-Needle U-100 (INSULIN SYRINGE .3CC/31GX5/16") 31G X  5/16" 0.3 ML MISC Inject 1 Syringe into the skin as directed.    [name] Kitchen levothyroxine (SYNTHROID, LEVOTHROID) 25 MCG tablet Take 25 mcg by mouth daily.    . metFORMIN (GLUCOPHAGE-XR) 500 MG 24 hr tablet Take 2 tablets (1,000 mg total) by mouth 2 (two) times daily.    . ondansetron (ZOFRAN) 4 MG tablet Take 1 tablet (4 mg total) by mouth 3 (three) times daily as needed for nausea or vomiting. 30 tablet 5  . pantoprazole (PROTONIX) 40 MG tablet Take 40 mg by mouth daily.    . predniSONE (DELTASONE) 1 MG tablet Take 3 mg by mouth daily.    . Treprostinil (TYVASO) 0.6 MG/ML SOLN Inhale 18 mcg into the lungs 4 (four) times daily.    [name] Kitchen umeclidinium bromide (INCRUSE ELLIPTA) 62.5 MCG/INH AEPB Inhale 1 puff into the lungs daily. 30 each 1   No current facility-administered medications on file prior to visit.         Observations/Objective: /17/2021-CMP-AST 168, ALT 134 02/17/2020-hepatic function panel-AST 134, ALT 110, bilirubin 0.4  02/07/2020-CTA chest/abdomen-advanced interstitial fibrosis lung disease bilaterally, right significantly more advanced than left, no consolidation or pleural effusion, no acute findings in the chest, no acute findings within the abdomen or pelvis  12/26/2019-pulmonary function test-FVC 1.96 (61% predicted), ratio 86, DLCO 13.14 (58% predicted)  07/25/2015-CT chest high-res-appearance of the lungs remains compatible with ILD although the distribution of findings is somewhat unusual that overall pattern is most compatible with UIP presumably a manifestation of rheumatoid lung in this patient with a history of rheumatoid arthritis, new 6 x 7 pulmonary nodule right lower lobe  01/20/2016-CT chest without contrast-resolution of right lower lobe pulmonary nodule, no new pulmonary lesions or acute pulmonary findings, stable changes of ILD with fairly extensive honeycombing, stable mediastinal and hilar lymph nodes and collateral vasculature  04/09/2017-CT chest  high-res-continued interval progression of fibrotic interstitial lung disease with extensive honeycombing, asymmetrically involving the right long with mild basilar predominance, diagnostic of UIP, stable mild reactive mediastinal lymphadenopathy  05/12/2019-CT chest high-res-no significant interval change in severe pulmonary fibrosis which is heterogeneously distributed most severe in the right lung base featuring extensive honeycombing and mild traction bronchiectasis, findings are not significantly changed compared to most recent examination in 2018, clearly progressed over time in comparison of 2013, coronary artery disease  05/26/2019-CT chest with contrast-no acute findings, changes of interstitial fibrosis stable from prior high-resolution CT chest on 05/12/2019, no evidence of pneumonia or pulmonary edema, right thyroid nodule which was biopsied on 12/06/2008 08/01/2018-pulmonary function test--spirometry with DLCO-FVC 2.09 (69% predicted), ratio 89, FEV1 1.87 (84% predicted), DLCO 52  10/06/2018-G6PD-14.4, normal  Repeat RHC yesterday c/wmild to moderate pulmonary HTN with high cardiac output.PA = 59/18 (34).PCW = 10.PVR = 3.1  Assessment and Plan: Rheumatoid arthritis associated interstitial lung disease with UIP pattern. Patient appears to be stable currently.  Previously intolerant to Ofev and Esbriet. [name] continue on oxygen.  Activity as tolerated. He does have associated pulmonary hypertension.  Continue on follow-up with cardiology.  Continue on Tyvaso.  Recent reported rash.  Seems to be self-limited and has resolved.  Unlikely due to Tyvaso.  Patient has restarted without return of rash. Patient is to follow-up with his primary care provider if rash returns.  Oxygen dependent respiratory failure.  Controlled on current oxygen settings at 4 L.  Patient's O2 saturation goals are greater than 88 to 90%.  Physical deconditioning.  Activity as tolerated  Rheumatoid  arthritis continue on current regimen and follow-up with rheumatology  Plan  . Patient Instructions  Continue on current regimen Follow-up with cardiology as planned. Continue on oxygen 4 L Activity as tolerated Follow-up in 4 months with Dr. Chase Caller as planned and as needed     Follow Up Instructions: Follow-up in 4 months as planned with Dr. Chase Caller and as needed   I discussed the assessment and treatment plan with the patient. The patient was provided an opportunity to ask questions and all were answered. The patient agreed with the plan and demonstrated an understanding of the instructions.   The patient was advised to call back or seek an in-person evaluation if the symptoms worsen or if the condition fails to improve as anticipated.  I provided  22 minutes of non-face-to-face time during this encounter.   Rexene Edison, NP

## 2021-03-27 DIAGNOSIS — L905 Scar conditions and fibrosis of skin: Secondary | ICD-10-CM | POA: Diagnosis not present

## 2021-03-27 DIAGNOSIS — L308 Other specified dermatitis: Secondary | ICD-10-CM | POA: Diagnosis not present

## 2021-03-28 ENCOUNTER — Telehealth: Payer: Self-pay | Admitting: Internal Medicine

## 2021-03-28 NOTE — Telephone Encounter (Signed)
Lm for patient.  

## 2021-03-28 NOTE — Telephone Encounter (Signed)
Patient returned call.  Patient stated he is currently taking Tyvaso, four times daily.  Patient had question about what 18 mcg was. Patient saw that on paper and thought his instructions were changed or he needed to take 18 times. Explained Tyvaso dose is in micrograms, and that does not change how he needs to take his medication. Read current prescription and instructions to Patient.  Understand stated.  Nothing further at this time.

## 2021-04-11 DIAGNOSIS — E559 Vitamin D deficiency, unspecified: Secondary | ICD-10-CM | POA: Diagnosis not present

## 2021-04-11 DIAGNOSIS — Z13820 Encounter for screening for osteoporosis: Secondary | ICD-10-CM | POA: Diagnosis not present

## 2021-04-11 DIAGNOSIS — D72819 Decreased white blood cell count, unspecified: Secondary | ICD-10-CM | POA: Diagnosis not present

## 2021-04-11 DIAGNOSIS — Z79899 Other long term (current) drug therapy: Secondary | ICD-10-CM | POA: Diagnosis not present

## 2021-04-11 DIAGNOSIS — M150 Primary generalized (osteo)arthritis: Secondary | ICD-10-CM | POA: Diagnosis not present

## 2021-04-11 DIAGNOSIS — J849 Interstitial pulmonary disease, unspecified: Secondary | ICD-10-CM | POA: Diagnosis not present

## 2021-04-11 DIAGNOSIS — E109 Type 1 diabetes mellitus without complications: Secondary | ICD-10-CM | POA: Diagnosis not present

## 2021-04-11 DIAGNOSIS — M0589 Other rheumatoid arthritis with rheumatoid factor of multiple sites: Secondary | ICD-10-CM | POA: Diagnosis not present

## 2021-05-06 DIAGNOSIS — R5383 Other fatigue: Secondary | ICD-10-CM | POA: Diagnosis not present

## 2021-05-06 DIAGNOSIS — I10 Essential (primary) hypertension: Secondary | ICD-10-CM | POA: Diagnosis not present

## 2021-05-06 DIAGNOSIS — E1065 Type 1 diabetes mellitus with hyperglycemia: Secondary | ICD-10-CM | POA: Diagnosis not present

## 2021-05-06 DIAGNOSIS — E782 Mixed hyperlipidemia: Secondary | ICD-10-CM | POA: Diagnosis not present

## 2021-05-14 DIAGNOSIS — D72819 Decreased white blood cell count, unspecified: Secondary | ICD-10-CM | POA: Diagnosis not present

## 2021-05-14 DIAGNOSIS — E782 Mixed hyperlipidemia: Secondary | ICD-10-CM | POA: Diagnosis not present

## 2021-05-14 DIAGNOSIS — J8410 Pulmonary fibrosis, unspecified: Secondary | ICD-10-CM | POA: Diagnosis not present

## 2021-05-14 DIAGNOSIS — E1022 Type 1 diabetes mellitus with diabetic chronic kidney disease: Secondary | ICD-10-CM | POA: Diagnosis not present

## 2021-05-14 DIAGNOSIS — J9611 Chronic respiratory failure with hypoxia: Secondary | ICD-10-CM | POA: Diagnosis not present

## 2021-05-14 DIAGNOSIS — I10 Essential (primary) hypertension: Secondary | ICD-10-CM | POA: Diagnosis not present

## 2021-05-14 DIAGNOSIS — I700 Atherosclerosis of aorta: Secondary | ICD-10-CM | POA: Diagnosis not present

## 2021-05-14 DIAGNOSIS — M0589 Other rheumatoid arthritis with rheumatoid factor of multiple sites: Secondary | ICD-10-CM | POA: Diagnosis not present

## 2021-05-14 DIAGNOSIS — Z0000 Encounter for general adult medical examination without abnormal findings: Secondary | ICD-10-CM | POA: Diagnosis not present

## 2021-05-14 DIAGNOSIS — Z9981 Dependence on supplemental oxygen: Secondary | ICD-10-CM | POA: Diagnosis not present

## 2021-05-14 DIAGNOSIS — N182 Chronic kidney disease, stage 2 (mild): Secondary | ICD-10-CM | POA: Diagnosis not present

## 2021-05-14 DIAGNOSIS — J849 Interstitial pulmonary disease, unspecified: Secondary | ICD-10-CM | POA: Diagnosis not present

## 2021-06-27 ENCOUNTER — Ambulatory Visit: Payer: Medicare Other | Admitting: Adult Health

## 2021-06-30 ENCOUNTER — Ambulatory Visit: Payer: Medicare Other | Admitting: Internal Medicine

## 2021-06-30 ENCOUNTER — Encounter: Payer: Self-pay | Admitting: Internal Medicine

## 2021-06-30 ENCOUNTER — Other Ambulatory Visit: Payer: Self-pay

## 2021-06-30 ENCOUNTER — Encounter: Payer: Medicare Other | Admitting: *Deleted

## 2021-06-30 VITALS — BP 144/80 | HR 86 | Temp 97.7°F | Ht 66.0 in | Wt 164.8 lb

## 2021-06-30 DIAGNOSIS — J9611 Chronic respiratory failure with hypoxia: Secondary | ICD-10-CM | POA: Diagnosis not present

## 2021-06-30 DIAGNOSIS — J8489 Other specified interstitial pulmonary diseases: Secondary | ICD-10-CM

## 2021-06-30 DIAGNOSIS — M359 Systemic involvement of connective tissue, unspecified: Secondary | ICD-10-CM

## 2021-06-30 DIAGNOSIS — J849 Interstitial pulmonary disease, unspecified: Secondary | ICD-10-CM

## 2021-06-30 DIAGNOSIS — Z006 Encounter for examination for normal comparison and control in clinical research program: Secondary | ICD-10-CM

## 2021-06-30 DIAGNOSIS — D849 Immunodeficiency, unspecified: Secondary | ICD-10-CM | POA: Diagnosis not present

## 2021-06-30 DIAGNOSIS — I2723 Pulmonary hypertension due to lung diseases and hypoxia: Secondary | ICD-10-CM

## 2021-06-30 LAB — PULMONARY FUNCTION TEST
DL/VA % pred: 55 %
DL/VA: 2.23 ml/min/mmHg/L
DLCO cor % pred: 34 %
DLCO cor: 7.62 ml/min/mmHg
DLCO unc % pred: 34 %
DLCO unc: 7.62 ml/min/mmHg
FEF 25-75 Pre: 2.21 L/sec
FEF2575-%Pred-Pre: 118 %
FEV1-%Pred-Pre: 66 %
FEV1-Pre: 1.53 L
FEV1FVC-%Pred-Pre: 115 %
FEV6-%Pred-Pre: 59 %
FEV6-Pre: 1.76 L
FEV6FVC-%Pred-Pre: 106 %
FVC-%Pred-Pre: 56 %
FVC-Pre: 1.76 L
Pre FEV1/FVC ratio: 87 %
Pre FEV6/FVC Ratio: 100 %

## 2021-06-30 MED ORDER — HYDROCODONE BIT-HOMATROP MBR 5-1.5 MG/5ML PO SOLN: 5.0000 mL | Freq: Two times a day (BID) | ORAL | 0 refills | Status: DC | PRN

## 2021-06-30 NOTE — Patient Instructions (Addendum)
Interstitial lung disease due to connective tissue disease ([place]) Chronic respiratory failure with hypoxia ([place])  -Glad you are stable.  Pulmonary function test shows stability -At this point in time no antifibrotic's because of side effects -Only immunomodulator therapy for rheumatoid arthritis which we hope will help your interstitial lung disease   Plan -Supportive care with oxygen therapy and physical conditioning -Immunomodulator therapy through the rheumatologist -Continue participation in ILD-pro registry  - meet Lazaro Arms 06/30/2021 --If there are other research protocols for you we will advise you  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   Plan  - try palliative treatment for  cough  - hycodan 30m twice daily as neded (new prescription today)   WHO group 3 pulmonary arterial hypertension ([place]  -You have this based on right heart catheterization in January 2022 - you are inhaled treprostinil since late February 2022/early March 2022 -I believe this is helping you as evidenced by improved  improved pulmonary function test  Plan -  continue inhaled treprostinil through [facility]  Follow-up -In 4 months do spirometry and DLCO -Return to see Dr. RChase Callerfor standard of care visit in 30-minute slot in 4 months

## 2021-06-30 NOTE — Progress Notes (Signed)
Spirometry and Dlco done today. 

## 2021-06-30 NOTE — Progress Notes (Signed)
OV 01/01/2017  Chief Complaint  Patient presents with   Follow-up    Pt here after PFT. Pt states overall his breathing is unchanged since last OV. Pt c/o occ dry cough. Pt deneis CP/tightness and f/c/s.    Follow-up interstitial lung disease UIP pattern associated with rheumatoid arthritis and immune suppression. Last visit in March 2017. He doesn'[name] that overall he has been stable. He has a new rheumatologist but still at [facility] Dr [name] [name]. His wife also says he is stable. However as he started talking to them his wife slowly started telling me that for the last few to several months he is wheezing more than usual and is a little more short of breath than usual. However he denied it. This is compliant with his medications. [name] not on any inhaler therapy. He uses oxygen only at night. Previous CT scans of the chest in September 2016 in March 2017 did not report any associated emphysema. His most recent pulmonary function test is shown that on Combivent. I personally visualized the graph: [name] Kitchen Forced vital capacity is stable with a diffusion capacity seems to decline over time. Walking desaturation test 185 feet 3 laps on room air: He desaturated to 84%. And then we walked him on 2 Steven oxygen he did not desaturate.   OV 06/23/2017  Chief Complaint  Patient presents with   Follow-up    pt here after CT in 04/2017. Pt states his breathing is unchanged since last OV. Pt c/o interemittent dry cough. Pt denies CP/tightness and f/c/s.      Follow-up interstitial lung disease UIP pattern associated with rheumatoid arthritis and on Imuran. Last visit in March 2018 I was concerned about progressive interstitial lung disease because he started desaturating on room air and his FVC show decline. Currently rated a high-resolution CT chest that confirmed progressive lung disease. We walked him 185 feet 3 laps on room air and he desaturated at third lap to 89%. This is very  similar to previous visit suggesting that  that his decline has been more gradual over the last 1 year. Compared to last visit he feels stable. His wife feels the same. [name] aware of his worsening fibrosis and the potential need to change anti fibrotics  OV 09/27/2017  Chief Complaint  Patient presents with   Follow-up    PFT done today. Pt states that he thinks his breathing has become worse. Has complaints of SOB with exertion, occ. dry cough. Denies any CP. States that he does wear O2 at night, DME: [facility].     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  Patient is on Imuran.  [name] having progressive disease this year 2018.  He told my certified medical assistant that he is having progressive shortness of breath but to me he tells me that he is stable.  As always he is a very poor historian.  Wife gives a better history.  He has oxygen at home for the night which she uses but does not use his exertional small portable system even though this helps him.  They do admit that dyspnea is progressive.  In fact pulmonary function test today shows progressive disease.  He also seem to desaturate a little bit more easily.  He continues to be on Imuran.  In talking to him it appears his rheumatologist has now started him on Rituxan.  He has had 2 doses 2 weeks apart with the most  recent one being mid November 2018.  The next dose is at mid May 2019 which is 6-monthinterval.  He says he is up-to-date with his flu shot but is not had a conversation with his primary care physician about the new shingles vaccine that is inactivated vaccine.  [name] not having much of joint issues.  [name] aware that the Rituxan is specifically designed to slow down the progression of pulmonary fibrosis.  Walking desaturation test 185 feet x3 laps on room air with a forehead probe.  Resting heart rate was 95/min.  Final heart rate 108/min.  Resting pulse ox was 95%.  By the time he  finished his second lap he desaturated to 84%.  He was then placed on 2 Steven nasal cannula and he was able to maintain his saturation to 95%.   OV 12/20/2017  Chief Complaint  Patient presents with   Follow-up    follow up PFT,SOB with activity/exertion   LFritz [name] is here for follow-up because of his progressive UIP pattern pulmonary fibrosis related to rheumatoid arthritis.  At his last visit he was started on Rituxan by rheumatology.  Since then we started him on portable oxygen but he says he is not using it because he does not like to use it.  He does use oxygen at night which is helping.  His next dose of Rituxan is in May 2019.  Both he and his wife attest that since the start of Rituxan his symptoms are stable particularly shortness of breath is not any worse.  In fact pulmonary function test today shows continued ongoing stability with FVC and DLCO would suggest that the Rituxan might be helping him.  There are no new issues.  [name] here to have a shingles vaccine.  I told him about this last visit to talk to primary care physician but he does not remember this.   IMPRESSION: June 2018 1. Continued interval progression of fibrotic interstitial lung disease with extensive honeycombing, asymmetrically involving the right lung, with mild basilar predominance, diagnostic of usual interstitial pneumonia (UIP) . 2. Stable mild reactive mediastinal lymphadenopathy. 3. Aortic atherosclerosis.  One vessel coronary atherosclerosis.     Electronically Signed   By: [name]   On: 04/09/2017 10:51   OV 08/01/2018  Subjective:  Patient ID: [name] [name] , DOB: 6Apr 05, 1947, age 75y.o. , MRN: [id number], [address] [id number]  08/01/2018 -   Chief Complaint  Patient presents with   Follow-up    PFT today, increased coughing (non-productive)    Follow-up interstitial lung disease UIP pattern associated with rheumatoid arthritis. Rx Rituxan since approx  Oct/Nov 2018. Background Immuran  HPI [name] 75y.o. -presents as usual with his wife.  He tells me that he continues on background Imuran.  [name] also on scheduled Rituxan.  He has an infusion coming up in a month or so.  This is according to his wife.  He continues to be a poor historian.  As best as I can gather from both of them is that his shortness of breath is actually a little bit better ever since starting Rituxan..  In fact his PFTs show continued improvement/stability.  With his FVC and DLCO.  However he tells me that his cough might be a little bit worse.  In talking to him it appears that is only mildly worse.  It happens mostly at night when he lies down.  He  coughs a few times and then it does not bother him.  The cough is dry.  He denies any postnasal drip or acid reflux disease.  He does not have any fever or sputum production.       OV 10/06/2018  Subjective:  Patient ID: [name] [name], male , DOB: 1946/09/22 , age 48 y.o. , MRN: [id number] , ADDRESS: [place] [id number]   10/06/2018 -   Chief Complaint  Patient presents with   Follow-up    f/u pulmonary fibrosis, no symptoms, complaints, breathing doind well     HPI Gamble Sloniker 75 y.o. -returns for ILD follow-up in the setting of rheumatoid arthritis.  This time he is not here with his wife who has other schedule conflicts.  He tells me overall he stable.  He has now started Rituxan.  As again he is a poor historian.  I have him starting Rituxan a year ago but he told me that it was only started recently and has had only 1 infusion.  He tells me that he is stable.  His cough is well controlled with this anticholinergic he is using nighttime oxygen.  He says he is compliant with his medications.  Last visit I told him to inquire about using Bactrim prophylaxis in the setting of B-cell depletion in the setting of Rituxan.  He has no idea recollection of this conversation.  So I did tell him that we will check  his G6PD and discuss directly with his rheumatologist about starting Bactrim prophylaxis.  [name] okay with this plan.  Overall dyspnea and cough are stable and it is mild.  Walking desaturation test shows stability.     OV 06/29/2019  Subjective:  Patient ID: [name] [name], male , DOB: 12/01/1945 , age 40 y.o. , MRN: [id number] , ADDRESS: [address] [place] [address]   Follow-up interstitial lung disease UIP pattern associated with rheumatoid arthritis. Rx Rituxan since approx Oct/Nov 2018. On Background Immuran, prednisone and dapsone for pcp prophylaxois = high risk medication use   06/29/2019 - ILD- RA  followup    HPI [name] [name] 75 y.o. -presents for follow-up.  I personally saw him December 2019 before the onset of the pandemic.  He tells me that he is doing fair.  He feels he is overall stable.  He continues to be on Rituxan.  He tells me the last dose of this infusion was in May 05, 2019.  This is managed by rheumatology.  In addition he is on Imuran, prednisone.  Also by middle of year my nurse practitioner started him on dapsone for PCP prophylaxis.  He did have a high-resolution CT chest that shows UIP findings and stability since 2018.  However, he did desaturate with exertion when he saw the nurse practitioner just over a month ago.  This is a new finding as documented below.  We walked him again today and again found his desaturation with exertion.  It required 5 Steven to correct.  In past he has done well with RA at times but at times required 2L. He denies any fever or sputum production.  His previous echocardiogram was in 2013.  Most recent CT scan of the chest notes coronary artery calcification   Noted WBC going down after dapsine start Results for HOLTEN, SPANO (MRN [id number]) as of 06/29/2019 10:19  Ref. Range 05/30/2019 10:17 06/07/2019 09:07 06/13/2019 08:59 06/20/2019 09:50  WBC Latest Ref Range: 4.0 - 10.5 K/uL 8.8 6.2 4.3 3.4 (  Steven)     IMPRESSION: HRCT July 2020 1. No  significant interval change in severe pulmonary fibrosis, which is heterogeneously distributed and most severe in the right lung base, featuring extensive honeycombing and mild traction bronchiectasis. Although findings are not significantly changed compared to most recent examination dated 2018, they are clearly progressed over time in comparison to priors dating back to 2013. Findings remain in a UIP pattern by ATS pulmonary fibrosis criteria   2.  Coronary artery disease.     Electronically Signed   By: Eddie Candle [name]   On: 05/12/2019 13:14  ROS - per HPI   OV 01/04/2020  Subjective:  Patient ID: [name] [name], male , DOB: [birth year] , age 66 y.o. , MRN: [id number] , [address] [place] [address]   01/04/2020 -   Chief Complaint  Patient presents with   Follow-up    Pt states he has been doing good since last visit. States he still has an occ cough.    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 Rx Rituxan since approx Oct/Nov 2018. On Background Immuran, prednisone and  = high risk medication use     HPI [name] [name] 75 y.o. -returns for follow-up.  Last seen in the summer 2020.  Overall he is doing stable.  He continues with his immunosuppressive medications through rheumatology.  This include Imuran prednisone and Rituxan.  He had his infusion of Rituxan this week.  He feels stable but his pulmonary function test done for this visit shows a decline.  It is documented below his walking desaturation test also is a decline from the past but similar to last visit 6 months or so ago.  His main symptom is chronic cough.  [name] labeled it as a level 3 out of 5 but it appears it might actually be more severe.  [name] asking for palliative relief of cough.  His last liver function test with [place] was in the summer 2020.  It appears per review of the chart in the interim he has seen cardiology Dr. Percival Spanish for his coronary artery  calcification and is on expectant follow-up for that.  No stress test has been recommended because of the lack of chest pain.  His wife who is usually here with him is not here today.  He said she slept in.  [name] on MDI - no emphysema noted in prior CT    ROS - per HPI  OV 02/15/2020  Subjective:  Patient ID: [name] [name], male , DOB: 15-Jan-1946 , age 75 y.o. , MRN: [id number] , ADDRESS: [place] [id number]   02/15/2020 -   Chief Complaint  Patient presents with   Follow-up    Follow-up interstitial lung disease UIP pattern associated with rheumatoid arthritis.   - Rx Rituxan since approx Oct/Nov 2018. On Background Immuran, prednisone and  = high risk medication use  - started ofev mid -  end of march 2021 for progressive disease    HPI [name] [name] 75 y.o. -he has progressive ILD because of rheumatoid arthritis UIP pattern.  Last visit started him on nintedanib.  [name] now here to follow-up for tolerance and monitoring of nintedanib because this requires intensive therapeutic monitoring.  [name] also immunosuppressed.  He tells me that he is now started nintedanib approximately 1 month ago.  [name] tolerating it fine without any nausea vomiting diarrhea.  Last visit he took the consent [name]  for the ILD-pro registry study.  [name] agreed now to participate in it.  He meets inclusion exclusion criteria because of progressive disease.  At this point in time he is got minimal symptoms.  He desaturated as before.  However he is not compliant with his portable oxygen with exertion.  His cough is improved because of Hycodan.   OV 05/01/2020  Subjective:  Patient ID: [name] [name], male , DOB: 03-19-1946 , age 20 y.o. , MRN: [id number] , ADDRESS: [place] [id number]   05/01/2020 -   Chief Complaint  Patient presents with   Follow-up    pt is here to to disuss ild    OV 05/30/2020   Subjective:  Patient ID: [name] [name], male , DOB:  1945-12-06, age 54 y.o. years. , MRN: [id number],  ADDRESS: [address] [place] [address] PCP  Merrilee Seashore, MD Providers : Treatment Team:  Attending Provider: Brand Males, MD   Chief Complaint  Patient presents with   Follow-up    Pt states he has been doing good since last visit and states his breathing is about the same.     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   - Rx Rituxan since approx Oct/Nov 2018. On Background Immuran, prednisone and  = high risk medication use  - started ofev mid -  end of march 2021 for progressive disease -> changed to [facility]  June 2021    HPI [name] [name] 75 y.o. -presents for follow-up.  This time he has brought his wife.  Have not seen the wife in a long time.  She appears to have lost weight.  She says she is dealing with her own health issues of unexplained weight loss.  She says she is abreast of his health issues when he returns home after visits.  However patient continues to be stoic and always states he is feeling well.  Last time he said he was feeling well but had high level of symptoms.  This time also he says he is feeling well and gaining weight and has marked very low level of symptoms.  Therefore overall his history is unreliable.  In the interim he has had his right heart catheterization.  He had nonobstructive coronary artery disease his.  His mean pulmonary artery pressure was elevated at 25.  Wedge pressure could not be elucidated.  His peripheral vascular resistance appears greater than 3.  Therefore he qualifies for nebulized treprostinil.  However he still in the uptake with his pirfenidone.  [name] not applying sunscreen and he says he will.  [name] tolerating pirfenidone well.  He has gained weight no nausea no vomiting no diarrhea.  He uses 4 Steven of pulsed oxygen at rest.  However at room air at rest he was normal.  He desaturated only after walking 2 laps.    OV  08/28/2020   Subjective:  Patient ID: [name] [name], male , DOB: December 03, 1945, age 89 y.o. years. , MRN: [id number],  ADDRESS: [address] [place] [address] PCP  Merrilee Seashore, MD Providers : Treatment Team:  Attending Provider: Brand Males, MD Patient Care Team: Merrilee Seashore, MD as PCP - General (Internal Medicine) Hennie Duos, MD as Consulting Physician (Rheumatology) Lauraine Rinne, NP as Nurse Practitioner (Pulmonary Disease)    Chief Complaint  Patient presents with   Follow-up    coughs when starts eating.      Follow-up interstitial lung disease UIP  pattern associated with rheumatoid arthritis.   - Rx Rituxan since approx Oct/Nov 2018. On Background Immuran, prednisone and  = high risk medication use  - started ofev mid -  end of march 2021 for progressive disease -> changed to [facility]  June 2021 -> stopped sept 2021 due to rash     HPI [name] [name] 75 y.o. -returns for follow-up.  At this visit he was supposed to do spirometry and DLCO but he has not.  He presents by himself.  It is unclear why he did not do spirometry and DLCO.  As usual he is stoic and does not give much of a history.  His symptom score appears stable.  His weight appears stable.  He uses 4 Steven of oxygen.  Between the last 2 visits he made phone calls suggesting initially that the pirfenidone was causing a rash in his upper arm but later the phone call said Spiriva.  We indicated that he should stop the Spiriva.  Today he tells me that he is actually continuing his Spiriva.  He tells me that the rash was caused by the pirfenidone/Esbriet.  Initially did not seem sure it was because of the [place] but latter when I repeated questions in multiple different ways he indicated it was desperate causing the rash therefore he stopped it.  He has been 1 month since he stopped it and the rash is disappeared.  The rash was in a nonexposed area in the right upper extremity.  He has had his Covid  vaccine but has not had his booster.  He continues to be on Rituxan which is tolerating well.  I did indicate to him that in the absence of B cells he might not have antibodies and he should get his Covid booster.  We will check Covid IgG as follow-up post booster needs continued intensive monitoring due to high complex status and immunosuppression    SYMPTOM SCALE - ILD 06/29/2019  01/04/2020  02/15/2020  05/01/2020  05/30/2020  08/28/2020   O2 use Uses 3-5L with exertion yuses 3-5L with exertion Non compliant 168# and 4L with exertion - on esbriet 172# on esbriet - 4L at rest  - filled bu him and wife 174# 4L o2  Shortness of Breath 0 -> 5 scale with 5 being worst (score 6 If unable to do)       At rest 3 0 0 4 0 0  Simple tasks - showers, clothes change, eating, shaving 2 1 0 5 0 0  Household (dishes, doing bed, laundry) 2 1 0 5 0   Shopping _0 0 0  Walking level at own pace _1 0 0  Walking up Stairs _2 Total (40 - 48) Dyspnea Score _3 How bad is your cough? _4 How bad is your fatigue 2 1 0 4 0 0  nasuea    4 0 0  vomit    0 0 0  diarrhea  0 0 0 0 0  anxiety  0 0 0 0 0  deipression  0 0 0 0 0       Simple office walk 08/01/2018 -  185 feet x  3 laps goal with forehead probe at [place]  10/06/2018 250 feet x 3 laps 06/29/2019  01/04/2020  02/15/2020  05/30/2020   O2 used Room air Room air Room air Room  air Room air raoom air after turing o2 off fx 5 mn  Number laps completed 3 3 Aim for 3 1 Did only 1 of 3 Did all 3 Did only 2 laps  Comments about pace normal normal  avg pace avg opae avg pagce  Resting Pulse Ox/HR 100% and 80/min 100% and 81/min 94% and 96/min 96% and 101/min 99% and 98/min 96% and 94/min  Final Pulse Ox/HR 94% and 114/min 91% and 111/min 86% at first lap, HR 131 86% and 118/min 88% and 123/min 83% and 125/,n  Desaturated </= 88% no no  yes yes   Desaturated <= 3% points yes yes  yes yes   Got Tachycardic >/= 90/min yes  yes  yes yes   Symptoms at end of test x x  Mild dyspnea dyspnea   Miscellaneous comments x x Needed 5L [place] to correct       Results for [name], [name] (MRN [id number]) as of 01/04/2020 09:36  Ref. Range 01/23/2014 15:38 10/01/2014 11:24 07/29/2015 11:09 12/18/2016 09:34 09/27/2017 09:33 12/20/2017 11:29 08/01/2018 09:39 12/26/2019 08:54 04/08/20  FVC-Pre Latest Units: Steven 2.18 2.05 2.17 2.21 2.06 2.10 2.09 1.96 1.73  FVC-%Pred-Pre Latest Units: % 66 62 66 68 64 65 69 61 54%   Results for [name], [name] (MRN [id number]) as of 01/04/2020 09:36  Ref. Range 01/23/2014 15:38 10/01/2014 11:24 07/29/2015 11:09 12/18/2016 09:34 09/27/2017 09:33 12/20/2017 11:29 08/01/2018 09:39 12/26/2019 08:54 6/7  DLCO unc Latest Units: ml/min/mmHg 17.93 16.86 16.72 13.81 12.14 12.30 13.24 13.14 9/15  DLCO unc % pred Latest Units: % 66 62 62 51 45 45 52 58 41%       OV 12/03/2020  Subjective:  Patient ID: [name] [name], male , DOB: 07-Apr-1946 , age 57 y.o. , MRN: [id number] , ADDRESS: [address] [place] [address] PCP Merrilee Seashore, MD Patient Care Team: Merrilee Seashore, MD as PCP - General (Internal Medicine) Minus Breeding, MD as PCP - Cardiology (Cardiology) Hennie Duos, MD as Consulting Physician (Rheumatology) Lauraine Rinne, NP as Nurse Practitioner (Pulmonary Disease)  This Provider for this visit: Treatment Team:  Attending Provider: Brand Males, MD    12/03/2020 -   Chief Complaint  Patient presents with   Follow-up    F/U after hospital stay. States he has been feeling well since being home.      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   - Rx Rituxan since approx Oct/Nov 2018. On Background Immuran, prednisone and  = high risk medication use  - started ofev mid -  end of march 2021 for progressive disease -> changed to [facility]  June 2021 -> stopped sept 2021 due to rash  -Participant in ILD-pro registry study   Pulm HTn [place] 11/18/20 v- Dr  Glori Bickers  Repeat [place] yesterday c/w mild to moderate pulmonary HTN with high cardiac output. PA = 59/18 (34). PCW = 10. PVR = 3.1  - Given PAH in setting of IPF, a trial of inhaled treprostinil seems reasonable    12/03/2020 [name] [name] Call returns for follow-up.  After my last visit in October 2021 he decompensated in January 2022 and admitted to the hospital intensive care unit needing BiPAP for respiratory failure in January 2022 last month.  I was in touch with the intensive care physician Dr. Doyne Keel and heart failure specialist Dr. Haroldine Laws during this time.  They did a right heart catheterization in the results are above.  Patient does have WHO group 3 pulmonary  hypertension.  Inhaled treprostinil is indicated.  Dr. Clayborne Dana pharmacist Audry Riles working on this.  I just touch base with Dr. Haroldine Laws about this.  Patient is here with his wife.  He says he is much improved.  [name] back to his 3 Steven nasal cannula at baseline.  He reports having had his Covid booster in October 2021.  He had his booster after we found that his Covid IgG was negative.  Of note he is on Rituxan which suppresses IgG production.  He and his wife are interested in future therapeutic options.  [name] a participant in the ILD-pro registry.        OV 02/27/2021  Subjective:  Patient ID: [name] [name], male , DOB: [birth year] , age 34 y.o. , MRN: [id number] , ADDRESS: [address] [place] [address] PCP Merrilee Seashore, MD Patient Care Team: Merrilee Seashore, MD as PCP - General (Internal Medicine) Minus Breeding, MD as PCP - Cardiology (Cardiology) Hennie Duos, MD as Consulting Physician (Rheumatology) Lauraine Rinne, NP as Nurse Practitioner (Pulmonary Disease)  This Provider for this visit: Treatment Team:  Attending Provider: Brand Males, MD    02/27/2021 -   Chief Complaint  Patient presents with   Follow-up    Doing ok, breathing is stable     HPI Merit Charo 75 y.o.  -returns for follow-up.  He presents with his wife.  He endorses stability.  His symptom score shows worsening but I suspect this is unreliable because he never gives a good history.  He had pulmonary function test that shows improvement compared to the most recent 1 but still worse compared to February 2021.  In the interim he has been started on inhaled treprostinil as of March 2022.  There is some subset analysis evidence to suggest treprostinil can modulte PFTs [and is the question being raised in the upcoming tETON trial and for IPF] he is on his immunomodulator treatment is finished COVID prophylaxis monoclonal antibody.  [name] able to participate today in the ILD Pro registry visit.      OV 06/30/2021  Subjective:  Patient ID: [name] [name], male , DOB: October 24, 1946 , age 24 y.o. , MRN: [id number] , ADDRESS: [address] [place] [address] PCP Merrilee Seashore, MD Patient Care Team: Merrilee Seashore, MD as PCP - General (Internal Medicine) Minus Breeding, MD as PCP - Cardiology (Cardiology) Hennie Duos, MD as Consulting Physician (Rheumatology) Lauraine Rinne, NP as Nurse Practitioner (Pulmonary Disease)  This Provider for this visit: Treatment Team:  Attending Provider: Melvenia Needles, NP    06/30/2021 -   Chief Complaint  Patient presents with   Follow-up    Pt states coughing.    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   - Rx Rituxan since approx Oct/Nov 2018. On Background Immuran, prednisone and  = high risk medication use  - started ofev mid -  end of march 2021 for progressive disease -> changed to [facility]  June 2021 -> stopped sept 2021 due to rash -> not on anti-foibritc  -Participant in ILD-pro registry study   Pulm HTn [place] 11/18/20 - Dr Glori Bickers  Repeat [place] yesterday c/w mild to moderate pulmonary HTN with high cardiac output. PA = 59/18 (34). PCW = 10. PVR = 3.1  -  started tyvaso early march 2022  - through Audry Riles - CHF Pharmacist  Associated mild emphysema [previous intolerance to  Spiriva)  - feb 2022 On inceuse  COvid prophylassix (IgG negative Feb 2022)  - tixagevimab/cilgavimab, for pre-exposure prophylaxis  Feb 2022 -= repeat dosing March 2022  HPI [name] [name] 75 y.o. -returns for follow-up.  He presents with his wife.  He tells me that overall he is doing stable.  [name] not on antifibrotic's symptom score shows stability.  Pulmonary function test done?  Declining versus stable.  See below.  [name] using 4 Steven of oxygen.  Wife states his cough is worse.  He then acknowledged that his cough is worse.  Wife rated as a 5 out of 5.  He says it does not affect his quality of life.  As always there is a discrepancy between his history and her history.  Nevertheless she feels it significant.  The sputum is clear.  There is no wheezing associated with this.  He has tried [employer] and other over-the-counter treatments but these have not helped.  We discussed doing Hycodan opioid cough relief for palliative management of cough in the setting of pulmonary fibrosis.  [name] open to this idea.  [name] taking inhaled Tyvaso for his pulmonary hypertension 4 times a day each time 9 puffs.  He feels it is helping.  At this point he is maxed out on therapy.  [name] doing prednisone, Imuran and Rituxan.  This for his rheumatoid arthritis.  If he declines he can always add CellCept.  [name] a participant in the research registry study.  His 36-monthvisit is anytime between now and November.  [name] willing to do his research visit today.      SYMPTOM SCALE - ILD 06/29/2019  01/04/2020  02/15/2020  05/01/2020  05/30/2020  08/28/2020  02/27/2021 On tyvasos 06/30/2021   O2 use Uses 3-5L with exertion yuses 3-5L with exertion Non compliant 168# and 4L with exertion - on esbriet 172# on esbriet - 4L at rest  - filled bu him and wife 174# 4L o2 172# 4L O2   Shortness of Breath 0 -> 5 scale with 5  being worst (score 6 If unable to do)         At rest 3 0 0 4 0 0 2 0  Simple tasks - showers, clothes change, eating, shaving 2 1 0 5 0 0 3 2  Household (dishes, doing bed, laundry) 2 1 0 5 0  0 2  Shopping _0 0 0 0 2  Walking level at own pace _1 0 0 2 2  Walking up Stairs _2 Total (40 - 48) Dyspnea Score _3 How bad is your cough? _4 How bad is your fatigue 2 1 0 4 0 0 2 5  nasuea    4 0 0 0 2  vomit    0 0 0 0 00  diarrhea  0 0 0 0 0 0 0  anxiety  0 0 0 0 0 0 00  deipression  0 0 0 0 0 0 0       Simple office walk 08/01/2018 -  185 feet x  3 laps goal with forehead probe at [place] 10/06/2018 250 feet x 3 laps 06/29/2019  01/04/2020  02/15/2020  05/30/2020  12/03/2020 Using 3L [place] at home all time 02/27/2021 Uses 4L at home  O2 used Room air Room air Room air Room  air Room air raoom air after turing o2 off fx 5 mn Room air Room air  Number laps completed 3 3 Aim for 3 1 Did only 1 of 3 Did all 3 Did only 2 laps Did only 0.5 lap desats at 0.5 of 3 Steven;aps  Comments about pace normal normal  avg pace avg opae avg pagce avg pace   Resting Pulse Ox/HR 100% and 80/min 100% and 81/min 94% and 96/min 96% and 101/min 99% and 98/min 96% and 94/min 96% and HR 96 89% RA -> 95 %  Final Pulse Ox/HR 94% and 114/min 91% and 111/min 86% at first lap, HR 131 86% and 118/min 88% and 123/min 83% and 125/,n 85% and HR 124 at half lap 87% at half lap  Desaturated </= 88% no no  yes yes     Desaturated <= 3% points yes yes  yes yes     Got Tachycardic >/= 90/min yes yes  yes yes     Symptoms at end of test x x  Mild dyspnea dyspnea     Miscellaneous comments x x Needed 5L [place] to correct    Corrected with 4L [place] and did noly 2 laps      PFT  Results for [name], [name] (MRN [id number]) as of 02/27/2021 11:48  Ref. Range 09/27/2017 09:33 12/20/2017 11:29 08/01/2018 09:39 12/26/2019 08:54 04/08/2020 09:49  FVC-Pre Latest Units: Steven 2.06 2.10 2.09 1.96 1.73   FVC-%Pred-Pre Latest Units: % 64 65 69 61 54  Results for [name], TAY (MRN [id number]) as of 02/27/2021 11:48  Ref. Range 09/27/2017 09:33 12/20/2017 11:29 08/01/2018 09:39 12/26/2019 08:54 04/08/2020 09:49  DLCO unc Latest Units: ml/min/mmHg 12.14 12.30 13.24 13.14 8.10  DLCO unc % pred Latest Units: % 45 45 52 58 36   PFT  PFT Results Latest Ref Rng & Units 06/30/2021 02/27/2021 04/08/2020 12/26/2019 08/01/2018 12/20/2017 09/27/2017  FVC-Pre Steven 1.76 1.85 1.73 1.96 2.09 2.10 2.06  FVC-Predicted Pre % 56 58 54 61 69 65 64  FVC-Post Steven - - - - - - -  FVC-Predicted Post % - - - - - - -  Pre FEV1/FVC % % 87 89 90 86 89 88 88  Post FEV1/FCV % % - - - - - - -  FEV1-Pre Steven 1.53 1.65 1.55 1.69 1.87 1.84 1.81  FEV1-Predicted Pre % 66 71 66 72 84 77 75  FEV1-Post Steven - - - - - - -  DLCO uncorrected ml/min/mmHg 7.62 10.01 8.10 13.14 13.24 12.30 12.14  DLCO UNC% % 34 45 36 58 52 45 45  DLCO corrected ml/min/mmHg 7.62 10.01 9.15 - - - 13.07  DLCO COR %Predicted % 34 45 41 - - - 48  DLVA Predicted % 55 67 68 88 95 80 78  TLC Steven - - - - - - -  TLC % Predicted % - - - - - - -  RV % Predicted % - - - - - - -       has a past medical history of Arthritis, COPD (chronic obstructive pulmonary disease) (HCC), Diabetes mellitus, Hypertension, Pneumonia, and Thyroid disease.   reports that he quit smoking about 36 years ago. His smoking use included cigarettes. He has a 5.00 pack-year smoking history. He has never used smokeless tobacco.  Past Surgical History:  Procedure Laterality Date   KNEE ARTHROSCOPY     NO PAST SURGERIES     RIGHT HEART CATH N/A 11/18/2020   Procedure: RIGHT HEART CATH;  Surgeon: Jolaine Artist, MD;  Location: [facility];  Service: Cardiovascular;  Laterality: N/A;   RIGHT/LEFT HEART CATH AND CORONARY ANGIOGRAPHY N/A 05/21/2020   Procedure: RIGHT/LEFT HEART CATH AND CORONARY ANGIOGRAPHY;  Surgeon: Martinique, Peter M, MD;  Location: [facility];  Service: Cardiovascular;   Laterality: N/A;    Allergies  Allergen Reactions   Bactrim [Sulfamethoxazole-Trimethoprim] Shortness Of Breath    Patient states "got short winded"   Spiriva Respimat [Tiotropium Bromide Monohydrate] Rash   Ofev [Nintedanib]     Immunization History  Administered Date(s) Administered   Fluad Quad(high Dose 65+) 08/12/2020   Influenza Split 08/03/2011, 07/03/2012, 09/02/2013, 07/03/2014   Influenza, High Dose Seasonal PF 07/30/2014, 07/15/2016, 08/02/2016, 08/02/2017, 07/25/2018, 07/04/2019   Influenza, Quadrivalent, Recombinant, Inj, Pf 08/11/2017, 07/05/2018, 07/11/2019, 08/15/2020   Influenza,inj,Quad PF,6+ Mos 07/29/2015   Influenza-Unspecified 07/23/2012   PFIZER(Purple Top)SARS-COV-2 Vaccination 01/01/2020, 01/13/2020, 02/05/2020, 08/31/2020   Pneumococcal Conjugate-13 08/20/2015, 08/01/2018   Pneumococcal Polysaccharide-23 11/03/2007, 12/04/2007, 01/12/2018   Zoster Recombinat (Shingrix) 04/11/2019    Family History  Problem Relation Age of Onset   Diabetes Mother    Stroke Mother      Current Outpatient Medications:    acetaminophen (TYLENOL) 500 MG tablet, Take 1,000 mg by mouth every 6 (six) hours as needed for moderate pain or headache., Disp: , Rfl:    albuterol (VENTOLIN HFA) 108 (90 Base) MCG/ACT inhaler, Inhale 2 puffs into the lungs every 6 (six) hours as needed., Disp: , Rfl:    amLODipine (NORVASC) 10 MG tablet, Take 10 mg by mouth daily., Disp: , Rfl:    aspirin 81 MG tablet, Take 81 mg by mouth daily., Disp: , Rfl:    atorvastatin (LIPITOR) 20 MG tablet, Take 20 mg by mouth daily., Disp: , Rfl: 2   azaTHIOprine (IMURAN) 50 MG tablet, Take 100 mg by mouth daily. , Disp: , Rfl:    fluticasone (FLONASE) 50 MCG/ACT nasal spray, Place 2 sprays into both nostrils daily., Disp: 16 g, Rfl: 2   folic acid (FOLVITE) 165 MCG tablet, Take 400 mcg by mouth daily., Disp: , Rfl:    insulin NPH Human (NOVOLIN N) 100 UNIT/ML injection, Inject 12 Units into the skin See  admin instructions. Inject 10 units every morning and 20 units every evening, Disp: , Rfl:    Insulin Syringe-Needle U-100 (INSULIN SYRINGE .3CC/31GX5/16") 31G X 5/16" 0.3 ML MISC, Inject 1 Syringe into the skin as directed., Disp: , Rfl:    levothyroxine (SYNTHROID, LEVOTHROID) 25 MCG tablet, Take 25 mcg by mouth daily., Disp: , Rfl:    metFORMIN (GLUCOPHAGE-XR) 500 MG 24 hr tablet, Take 2 tablets (1,000 mg total) by mouth 2 (two) times daily., Disp: , Rfl:    pantoprazole (PROTONIX) 40 MG tablet, Take 40 mg by mouth daily., Disp: , Rfl:    predniSONE (DELTASONE) 1 MG tablet, Take 3 mg by mouth daily., Disp: , Rfl:    Treprostinil (TYVASO) 0.6 MG/ML SOLN, Inhale 18 mcg into the lungs 4 (four) times daily., Disp: , Rfl:    umeclidinium bromide (INCRUSE ELLIPTA) 62.5 MCG/INH AEPB, Inhale 1 puff into the lungs daily., Disp: 30 each, Rfl: 1      Objective:   Vitals:   06/30/21 1409  BP: (!) 144/80  Pulse: 86  Temp: 97.7 F (36.5 C)  TempSrc: Oral  SpO2: 96%  Weight: 164 lb 12.8 oz (74.8 kg)  Height: _0  (1.676 m)    Estimated body mass index is 26.6 kg/m as  calculated from the following:   Height as of this encounter: 5' 6" (1.676 m).   Weight as of this encounter: 164 lb 12.8 oz (74.8 kg).  _0 @  Filed Weights   06/30/21 1409  Weight: 164 lb 12.8 oz (74.8 kg)     Physical Exam General: No distress. Looks lean Neuro: Alert and Oriented x 3. GCS 15. Speech normal Psych: Pleasant Resp:  Barrel Chest - noi.  Wheeze - no, Crackles - yes a lot, No overt respiratory distress CVS: Normal heart sounds. Murmurs - no Ext: Stigmata of Connective Tissue Disease - RA HEENT: Normal upper airway. PEERL +. No post nasal drip        Assessment:       ICD-10-CM   1. Chronic respiratory failure with hypoxia (HCC)  J96.11     2. Interstitial lung disease due to connective tissue disease ([place])  J84.89    M35.9     3. Immunosuppressed status ([place])  D84.9     4. WHO  group 3 pulmonary arterial hypertension (HCC)  I27.23          Plan:     Patient Instructions  Interstitial lung disease due to connective tissue disease ([place]) Chronic respiratory failure with hypoxia ([place])  -Glad you are stable.  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 RChase Callerfor [name] of care visit in 30-minute slot in 4 months    SIGNATURE    Dr. MBrand Males [name], [name],  Pulmonary and Critical Care Medicine Staff Physician, [place]Director - Interstitial Lung Disease  Program  [place]ulmonary [place] [address] Pager: [phone] If no answer or between  15:00h - 7:00h: call [phone] Telephone: [phone]  3:12 PM 06/30/2021

## 2021-06-30 NOTE — Research (Signed)
Title: Chronic Fibrosing Interstitial Lung Disease with Progressive Phenotype Prospective Outcomes (ILD-PRO) Registry   Protocol #: IPF-PRO-SUB, Clinical Trials # [id number], Sponsor: [employer]  Protocol Version   Amendment 4 (Version Date: 14 July 2018)  and confirmed current on 06/30/2021 Consent Version for today's visit date of 06/30/2021 [place] [employer] Approved Version 06 Oct 2018 Revised 06 Oct 2018  Objectives:  Describe current approaches to diagnosis and treatment of chronic fibrosing ILDs with progressive phenotype  Describe the natural history of chronic fibrosing ILDs with progressive phenotype  Assess quality of life from self-administered participant reported questionnaires for each disease group  Describe participant interactions with the healthcare system, describe treatment practices across multiple institutions for each disease group  Collect biological samples linked to well characterized chronic fibrosing ILDs with progressive phenotype to identify disease biomarkers  Collect data and biological samples that will support future research studies.                                            Key Inclusion Criteria: Willing and able to provide informed consent  Age ? 30 years  Diagnosis of a non-IPF ILD of any duration, including, but not limited to Idiopathic Non-Specific Interstitial Pneumonia (INSIP), Unclassifiable Idiopathic Interstitial Pneumonias (IIPs), Interstitial Pneumonia with Autoimmune Features (IPAF), Autoimmune ILDs such as Rheumatoid Arthritis (RA-ILD) and Systemic Sclerosis (SSC-ILD), Chronic Hypersensitivity Pneumonitis (HP), Sarcoidosis or Exposure-related ILDs such as asbestosis.  Chronic fibrosing ILD defined by reticular abnormality with traction bronchiectasis with or without honeycombing confirmed by chest HRCT scan and/or lung biopsy.  Progressive phenotype as defined by fulfilling at least one of the criteria below of fibrotic  changes (progression set point) within the last 24 months regardless of treatment considered appropriate in individual ILDs:  decline in FVC % predicted (% pred) based on >10% relative decline  decline in FVC % pred based on ? 5 - <10% relative decline in FVC combined with worsening of respiratory symptoms as assessed by the site investigator  decline in FVC % pred based on ? 5 - <10% relative decline in FVC combined with increasing extent of fibrotic changes on chest imaging (HRCT scan) as assessed by the site investigator  decline in DLCO % pred based on ? 10% relative decline  worsening of respiratory symptoms as well as increasing extent of fibrotic changes on chest imaging (HRCT scan) as assessed by the site investigator independent of FVC change.    Key Exclusion Criteria: Malignancy, treated or untreated, other than skin or early stage prostate cancer, within the past 5 years  Currently listed for lung transplantation at the time of enrollment  Currently enrolled in a clinical trial at the time of enrollment in this registry     [facility] Coordinator note :   This visit for Subject [name] with DOB: May 17, 1946 on 06/30/2021 for the above protocol is Visit/Encounter #3 and is for purpose of research.   Subject expressed continued interest and consent in continuing as a study subject. Subject confirmed that there was no change in contact information (e.g. address, telephone, email). Subject thanked for participation in research and contribution to science.    During this visit on 06/30/21 the subject completed the blood work and questionnaires per the above referenced protocol. Please see the subject's paper source binder for complete details.   Signed by  Lazaro Arms  [place], [place] 3:51 [place] 06/30/2021

## 2021-07-11 ENCOUNTER — Other Ambulatory Visit: Payer: Self-pay | Admitting: Internal Medicine

## 2021-07-11 NOTE — Telephone Encounter (Signed)
Call made to patient, confirmed DOB. I made patient aware this was just filled on 8/29. If using twice daily it should have lasted for about 24 days. Patient states he is only using twice daily.   MR please advise requesting refill of hycodan cough syrup.   Last OV: 06/30/21 Last Filled: 06/30/21

## 2021-07-11 NOTE — Telephone Encounter (Signed)
Not sure how 1 month supply is already ending up so fast.  He sometimes is a poor historian.  You can certainly fill this up but I would only be able to do the fingerprint signature early next week.  This because I am in the hospital rotation and did not have the phone app with me.  But please tell him that moving forward we will not fill it any sooner than 1 month

## 2021-07-28 DIAGNOSIS — Z23 Encounter for immunization: Secondary | ICD-10-CM | POA: Diagnosis not present

## 2021-07-28 NOTE — Telephone Encounter (Signed)
MR- please send refill on hycodan to pharmacy. It must be sent electronically. Thanks.

## 2021-07-30 NOTE — Telephone Encounter (Signed)
Hycodan is pending in patient's chart.

## 2021-07-30 NOTE — Telephone Encounter (Signed)
Please do a prescription draft and routed to me and I will electronically sign it with fingerprint or secure code 2 factor authentication today/tomorrow

## 2021-08-01 DIAGNOSIS — K219 Gastro-esophageal reflux disease without esophagitis: Secondary | ICD-10-CM | POA: Diagnosis not present

## 2021-08-01 DIAGNOSIS — I129 Hypertensive chronic kidney disease with stage 1 through stage 4 chronic kidney disease, or unspecified chronic kidney disease: Secondary | ICD-10-CM | POA: Diagnosis not present

## 2021-08-01 DIAGNOSIS — E1022 Type 1 diabetes mellitus with diabetic chronic kidney disease: Secondary | ICD-10-CM | POA: Diagnosis not present

## 2021-08-01 DIAGNOSIS — N182 Chronic kidney disease, stage 2 (mild): Secondary | ICD-10-CM | POA: Diagnosis not present

## 2021-08-01 MED ORDER — HYDROCODONE BIT-HOMATROP MBR 5-1.5 MG/5ML PO SOLN: 5.0000 mL | Freq: Two times a day (BID) | ORAL | 0 refills | Status: DC | PRN

## 2021-08-02 DIAGNOSIS — R0902 Hypoxemia: Secondary | ICD-10-CM | POA: Diagnosis not present

## 2021-08-11 DIAGNOSIS — M0589 Other rheumatoid arthritis with rheumatoid factor of multiple sites: Secondary | ICD-10-CM | POA: Diagnosis not present

## 2021-08-11 DIAGNOSIS — E559 Vitamin D deficiency, unspecified: Secondary | ICD-10-CM | POA: Diagnosis not present

## 2021-08-11 DIAGNOSIS — E109 Type 1 diabetes mellitus without complications: Secondary | ICD-10-CM | POA: Diagnosis not present

## 2021-08-11 DIAGNOSIS — J849 Interstitial pulmonary disease, unspecified: Secondary | ICD-10-CM | POA: Diagnosis not present

## 2021-08-11 DIAGNOSIS — J9611 Chronic respiratory failure with hypoxia: Secondary | ICD-10-CM | POA: Diagnosis not present

## 2021-08-11 DIAGNOSIS — Z13820 Encounter for screening for osteoporosis: Secondary | ICD-10-CM | POA: Diagnosis not present

## 2021-08-11 DIAGNOSIS — Z79899 Other long term (current) drug therapy: Secondary | ICD-10-CM | POA: Diagnosis not present

## 2021-08-11 DIAGNOSIS — M150 Primary generalized (osteo)arthritis: Secondary | ICD-10-CM | POA: Diagnosis not present

## 2021-08-11 DIAGNOSIS — D72819 Decreased white blood cell count, unspecified: Secondary | ICD-10-CM | POA: Diagnosis not present

## 2021-08-18 DIAGNOSIS — E119 Type 2 diabetes mellitus without complications: Secondary | ICD-10-CM | POA: Diagnosis not present

## 2021-08-18 DIAGNOSIS — H5213 Myopia, bilateral: Secondary | ICD-10-CM | POA: Diagnosis not present

## 2021-08-18 DIAGNOSIS — H2513 Age-related nuclear cataract, bilateral: Secondary | ICD-10-CM | POA: Diagnosis not present

## 2021-08-18 DIAGNOSIS — H35371 Puckering of macula, right eye: Secondary | ICD-10-CM | POA: Diagnosis not present

## 2021-08-25 DIAGNOSIS — M0589 Other rheumatoid arthritis with rheumatoid factor of multiple sites: Secondary | ICD-10-CM | POA: Diagnosis not present

## 2021-09-01 DIAGNOSIS — N182 Chronic kidney disease, stage 2 (mild): Secondary | ICD-10-CM | POA: Diagnosis not present

## 2021-09-01 DIAGNOSIS — E1022 Type 1 diabetes mellitus with diabetic chronic kidney disease: Secondary | ICD-10-CM | POA: Diagnosis not present

## 2021-09-01 DIAGNOSIS — I129 Hypertensive chronic kidney disease with stage 1 through stage 4 chronic kidney disease, or unspecified chronic kidney disease: Secondary | ICD-10-CM | POA: Diagnosis not present

## 2021-09-01 DIAGNOSIS — K219 Gastro-esophageal reflux disease without esophagitis: Secondary | ICD-10-CM | POA: Diagnosis not present

## 2021-09-09 DIAGNOSIS — M0589 Other rheumatoid arthritis with rheumatoid factor of multiple sites: Secondary | ICD-10-CM | POA: Diagnosis not present

## 2021-10-01 DIAGNOSIS — E1022 Type 1 diabetes mellitus with diabetic chronic kidney disease: Secondary | ICD-10-CM | POA: Diagnosis not present

## 2021-10-01 DIAGNOSIS — I700 Atherosclerosis of aorta: Secondary | ICD-10-CM | POA: Diagnosis not present

## 2021-10-01 DIAGNOSIS — E782 Mixed hyperlipidemia: Secondary | ICD-10-CM | POA: Diagnosis not present

## 2021-10-08 DIAGNOSIS — E1022 Type 1 diabetes mellitus with diabetic chronic kidney disease: Secondary | ICD-10-CM | POA: Diagnosis not present

## 2021-10-08 DIAGNOSIS — E782 Mixed hyperlipidemia: Secondary | ICD-10-CM | POA: Diagnosis not present

## 2021-10-08 DIAGNOSIS — N182 Chronic kidney disease, stage 2 (mild): Secondary | ICD-10-CM | POA: Diagnosis not present

## 2021-10-08 DIAGNOSIS — I10 Essential (primary) hypertension: Secondary | ICD-10-CM | POA: Diagnosis not present

## 2021-10-10 ENCOUNTER — Telehealth: Payer: Self-pay | Admitting: Internal Medicine

## 2021-10-10 MED ORDER — NIRMATRELVIR/RITONAVIR (PAXLOVID)TABLET: 3.0000 | ORAL_TABLET | Freq: Two times a day (BID) | ORAL | 0 refills | Status: AC

## 2021-10-10 NOTE — Telephone Encounter (Signed)
ATC LVMTCB x1

## 2021-10-10 NOTE — Telephone Encounter (Signed)
Called and spoke with pt's spouse [name] who stated pt tested positive for covid today 12/9.  Pt had a temp of 101 this morning 12/9 and due to the temp, Fleta said that she gave him some tylenol.  Sugar had dropped earlier this morning and EMS was called. Pt's sugar did go back up so pt did not want to go to hospital via EMS.  [name] said that her daughter had covid first and then herself and pt started having symptoms and today was when pt tested positive for covid.  Other than the fever, pt is coughing but is not getting up any phlegm. Pt also is feeling weak.  Fleta and pt want to know what can be recommended. MR, please advise.

## 2021-10-10 NOTE — Telephone Encounter (Signed)
Rx for paxlovid has been sent to pharmacy for pt. Called and spoke with pt's spouse Mellody Dance letting her know the info per MR and that we had sent Rx for paxlovid to the pharmacy for pt. Fleta verbalized understanding. Nothing further needed.

## 2021-10-10 NOTE — Telephone Encounter (Signed)
Fleta wife is returning phone call. [place] [facility] phone number is [phone].

## 2021-10-10 NOTE — Telephone Encounter (Signed)
PAXLOVID   Paxlovid (nirmatelvir 300/Ritonavir100) - BID x 5 days - for GFR >= 60 -based on normal renal function in January 2022  PLEASE CHECK MED LIST for the following issues. Please check the 2 different condition related to concomitant medications in alphabetical order  If [place] with DOB 18-Dec-1945 is on any of the following Strong CYP3A inhibtors - this patient Demitrius Toohey should withold these concomitant meds so they can start paxlovid stratight away. If taking any of these:  alfuzosin, amiodarone, clozapine, colchicine, dihydroergotamine, dronedarone, ergotamine, flecainide, lovastatin, lurasidone, methylergonovine, midazolam [oral], pethidine, pimozide, propafenone, propoxyphene, quinidine, ranolazine, sildenafil simvastatin, triazolam).   If   Blas Ziebell  with dob 1946/04/14 Is on any of these other strong CYP3A inducers then starting paxlovid should be delayed and the following meds should wash out first. These are dapalutamide, carbamazepine, phenobarbital, phenytoin, rifampin, St John's wort) - let me know immediately and we should delay starting paxlovid by some days even if he stops these medication.    PLEASE INFORM Soham Old  OF FOLLOWING SIDE EFFECTS  Side effects - all < 5%  - skin rash (and veyr rare a conditon called TEN) - angiomedia  - myalgia - jaundice - high bP (1%) - loss of taste  - diarrhea - 25 % chance rebound

## 2021-11-24 ENCOUNTER — Ambulatory Visit (INDEPENDENT_AMBULATORY_CARE_PROVIDER_SITE_OTHER): Payer: Medicare Other | Admitting: Internal Medicine

## 2021-11-24 ENCOUNTER — Other Ambulatory Visit: Payer: Self-pay

## 2021-11-24 ENCOUNTER — Ambulatory Visit: Payer: Medicare Other | Admitting: Internal Medicine

## 2021-11-24 ENCOUNTER — Encounter: Payer: Self-pay | Admitting: Internal Medicine

## 2021-11-24 VITALS — BP 116/74 | HR 89 | Ht 66.0 in | Wt 169.2 lb

## 2021-11-24 DIAGNOSIS — M359 Systemic involvement of connective tissue, unspecified: Secondary | ICD-10-CM | POA: Diagnosis not present

## 2021-11-24 DIAGNOSIS — J8489 Other specified interstitial pulmonary diseases: Secondary | ICD-10-CM

## 2021-11-24 DIAGNOSIS — J9611 Chronic respiratory failure with hypoxia: Secondary | ICD-10-CM

## 2021-11-24 DIAGNOSIS — R053 Chronic cough: Secondary | ICD-10-CM

## 2021-11-24 DIAGNOSIS — J8410 Pulmonary fibrosis, unspecified: Secondary | ICD-10-CM

## 2021-11-24 DIAGNOSIS — Z5181 Encounter for therapeutic drug level monitoring: Secondary | ICD-10-CM | POA: Diagnosis not present

## 2021-11-24 DIAGNOSIS — I2723 Pulmonary hypertension due to lung diseases and hypoxia: Secondary | ICD-10-CM | POA: Diagnosis not present

## 2021-11-24 LAB — PULMONARY FUNCTION TEST
DL/VA % pred: 46 %
DL/VA: 1.88 ml/min/mmHg/L
DLCO cor % pred: 29 %
DLCO cor: 6.59 ml/min/mmHg
DLCO unc % pred: 29 %
DLCO unc: 6.59 ml/min/mmHg
FEF 25-75 Pre: 2.13 L/sec
FEF2575-%Pred-Pre: 114 %
FEV1-%Pred-Pre: 66 %
FEV1-Pre: 1.52 L
FEV1FVC-%Pred-Pre: 112 %
FEV6-%Pred-Pre: 60 %
FEV6-Pre: 1.78 L
FEV6FVC-%Pred-Pre: 106 %
FVC-%Pred-Pre: 56 %
FVC-Pre: 1.78 L
Pre FEV1/FVC ratio: 85 %
Pre FEV6/FVC Ratio: 100 %

## 2021-11-24 MED ORDER — DOXYCYCLINE HYCLATE 100 MG PO TABS: 100.0000 mg | ORAL_TABLET | Freq: Two times a day (BID) | ORAL | 0 refills | Status: DC

## 2021-11-24 MED ORDER — INCRUSE ELLIPTA 62.5 MCG/ACT IN AEPB: 1.0000 | INHALATION_SPRAY | Freq: Every day | RESPIRATORY_TRACT | 5 refills | Status: DC

## 2021-11-24 MED ORDER — HYDROCODONE BIT-HOMATROP MBR 5-1.5 MG/5ML PO SOLN: 5.0000 mL | Freq: Two times a day (BID) | ORAL | 0 refills | Status: DC | PRN

## 2021-11-24 NOTE — Progress Notes (Signed)
OV 01/01/2017  Chief Complaint  Patient presents with   Follow-up    Pt here after PFT. Pt states overall his breathing is unchanged since last OV. Pt c/o occ dry cough. Pt deneis CP/tightness and f/c/s.    Follow-up interstitial lung disease UIP pattern associated with rheumatoid arthritis and immune suppression. Last visit in March 2017. He doesn't that overall he has been stable. He has a new rheumatologist but still at [facility] Dr [name]. His wife also says he is stable. However as he started talking to them his wife slowly started telling me that for the last few to several months he is wheezing more than usual and is a little more short of breath than usual. However he denied it. This is compliant with his medications. [name] not on any inhaler therapy. He uses oxygen only at night. Previous CT scans of the chest in September 2016 in March 2017 did not report any associated emphysema. His most recent pulmonary function test is shown that on Combivent. I personally visualized the graph: [name] Kitchen Forced vital capacity is stable with a diffusion capacity seems to decline over time. Walking desaturation test 185 feet 3 laps on room air: He desaturated to 84%. And then we walked him on 2 Steven oxygen he did not desaturate.   OV 06/23/2017  Chief Complaint  Patient presents with   Follow-up    pt here after CT in 04/2017. Pt states his breathing is unchanged since last OV. Pt c/o interemittent dry cough. Pt denies CP/tightness and f/c/s.      Follow-up interstitial lung disease UIP pattern associated with rheumatoid arthritis and on Imuran. Last visit in March 2018 I was concerned about progressive interstitial lung disease because he started desaturating on room air and his FVC show decline. Currently rated a high-resolution CT chest that confirmed progressive lung disease. We walked him 185 feet 3 laps on room air and he desaturated at third lap to 89%. This is very  similar to previous visit suggesting that  that his decline has been more gradual over the last 1 year. Compared to last visit he feels stable. His wife feels the same. [name] aware of his worsening fibrosis and the potential need to change anti fibrotics  OV 09/27/2017  Chief Complaint  Patient presents with   Follow-up    PFT done today. Pt states that he thinks his breathing has become worse. Has complaints of SOB with exertion, occ. dry cough. Denies any CP. States that he does wear O2 at night, DME: [facility].     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  Patient is on Imuran.  [name] having progressive disease this year 2018.  He told my certified medical assistant that he is having progressive shortness of breath but to me he tells me that he is stable.  As always he is a very poor historian.  Wife gives a better history.  He has oxygen at home for the night which she uses but does not use his exertional small portable system even though this helps him.  They do admit that dyspnea is progressive.  In fact pulmonary function test today shows progressive disease.  He also seem to desaturate a little bit more easily.  He continues to be on Imuran.  In talking to him it appears his rheumatologist has now started him on Rituxan.  He has had 2 doses 2 weeks apart with the  most recent one being mid November 2018.  The next dose is at mid May 2019 which is 27-monthinterval.  He says he is up-to-date with his flu shot but is not had a conversation with his primary care physician about the new shingles vaccine that is inactivated vaccine.  [name] not having much of joint issues.  [name] aware that the Rituxan is specifically designed to slow down the progression of pulmonary fibrosis.  Walking desaturation test 185 feet x3 laps on room air with a forehead probe.  Resting heart rate was 95/min.  Final heart rate 108/min.  Resting pulse ox was 95%.  By the time he  finished his second lap he desaturated to 84%.  He was then placed on 2 Steven nasal cannula and he was able to maintain his saturation to 95%.   OV 12/20/2017  Chief Complaint  Patient presents with   Follow-up    follow up PFT,SOB with activity/exertion   LFritz [name] is here for follow-up because of his progressive UIP pattern pulmonary fibrosis related to rheumatoid arthritis.  At his last visit he was started on Rituxan by rheumatology.  Since then we started him on portable oxygen but he says he is not using it because he does not like to use it.  He does use oxygen at night which is helping.  His next dose of Rituxan is in May 2019.  Both he and his wife attest that since the start of Rituxan his symptoms are stable particularly shortness of breath is not any worse.  In fact pulmonary function test today shows continued ongoing stability with FVC and DLCO would suggest that the Rituxan might be helping him.  There are no new issues.  [name] here to have a shingles vaccine.  I told him about this last visit to talk to primary care physician but he does not remember this.   IMPRESSION: June 2018 1. Continued interval progression of fibrotic interstitial lung disease with extensive honeycombing, asymmetrically involving the right lung, with mild basilar predominance, diagnostic of usual interstitial pneumonia (UIP) . 2. Stable mild reactive mediastinal lymphadenopathy. 3. Aortic atherosclerosis.  One vessel coronary atherosclerosis.     Electronically Signed   By: Steven SorrelM.D.   On: 04/09/2017 10:51   OV 08/01/2018  Subjective:  Patient ID: [name] , DOB: [birth year], age 76y.o. , MRN: [id number], [place] [id number]  08/01/2018 -   Chief Complaint  Patient presents with   Follow-up    PFT today, increased coughing (non-productive)    Follow-up interstitial lung disease UIP pattern associated with rheumatoid arthritis. Rx Rituxan since approx  Oct/Nov 2018. Background Immuran  HPI [name] 76y.o. -presents as usual with his wife.  He tells me that he continues on background Imuran.  [name] also on scheduled Rituxan.  He has an infusion coming up in a month or so.  This is according to his wife.  He continues to be a poor historian.  As best as I can gather from both of them is that his shortness of breath is actually a little bit better ever since starting Rituxan..  In fact his PFTs show continued improvement/stability.  With his FVC and DLCO.  However he tells me that his cough might be a little bit worse.  In talking to him it appears that is only mildly worse.  It happens mostly at night when he lies down.  He coughs a few times and then it does not bother him.  The cough is dry.  He denies any postnasal drip or acid reflux disease.  He does not have any fever or sputum production.       OV 10/06/2018  Subjective:  Patient ID: Mae Cianci, male , DOB: 27-Nov-1945 , age 57 y.o. , MRN: [id number] , ADDRESS: [place] [address]   10/06/2018 -   Chief Complaint  Patient presents with   Follow-up    f/u pulmonary fibrosis, no symptoms, complaints, breathing doind well     HPI [name] 75 y.o. -returns for ILD follow-up in the setting of rheumatoid arthritis.  This time he is not here with his wife who has other schedule conflicts.  He tells me overall he stable.  He has now started Rituxan.  As again he is a poor historian.  I have him starting Rituxan a year ago but he told me that it was only started recently and has had only 1 infusion.  He tells me that he is stable.  His cough is well controlled with this anticholinergic he is using nighttime oxygen.  He says he is compliant with his medications.  Last visit I told him to inquire about using Bactrim prophylaxis in the setting of B-cell depletion in the setting of Rituxan.  He has no idea recollection of this conversation.  So I did tell him that we will check  his G6PD and discuss directly with his rheumatologist about starting Bactrim prophylaxis.  [name] okay with this plan.  Overall dyspnea and cough are stable and it is mild.  Walking desaturation test shows stability.     OV 06/29/2019  Subjective:  Patient ID: [name], male , DOB: Feb 19, 1946 , age 73 y.o. , MRN: [id number] , ADDRESS: [address] [place] [address]   Follow-up interstitial lung disease UIP pattern associated with rheumatoid arthritis. Rx Rituxan since approx Oct/Nov 2018. On Background Immuran, prednisone and dapsone for pcp prophylaxois = high risk medication use   06/29/2019 - ILD- RA  followup    HPI [name] 76 y.o. -presents for follow-up.  I personally saw him December 2019 before the onset of the pandemic.  He tells me that he is doing fair.  He feels he is overall stable.  He continues to be on Rituxan.  He tells me the last dose of this infusion was in May 05, 2019.  This is managed by rheumatology.  In addition he is on Imuran, prednisone.  Also by middle of year my nurse practitioner started him on dapsone for PCP prophylaxis.  He did have a high-resolution CT chest that shows UIP findings and stability since 2018.  However, he did desaturate with exertion when he saw the nurse practitioner just over a month ago.  This is a new finding as documented below.  We walked him again today and again found his desaturation with exertion.  It required 5 Steven to correct.  In past he has done well with RA at times but at times required 2L. He denies any fever or sputum production.  His previous echocardiogram was in 2013.  Most recent CT scan of the chest notes coronary artery calcification   Noted WBC going down after dapsine start Results for BORIS, ENGELMANN (MRN [id number]) as of 06/29/2019 10:19  Ref. Range 05/30/2019 10:17 06/07/2019 09:07 06/13/2019 08:59 06/20/2019 09:50  WBC Latest Ref Range: 4.0 - 10.5 K/uL 8.8 6.2 4.3  3.4 (Steven)     IMPRESSION: HRCT July 2020 1. No  significant interval change in severe pulmonary fibrosis, which is heterogeneously distributed and most severe in the right lung base, featuring extensive honeycombing and mild traction bronchiectasis. Although findings are not significantly changed compared to most recent examination dated 2018, they are clearly progressed over time in comparison to priors dating back to 2013. Findings remain in a UIP pattern by ATS pulmonary fibrosis criteria   2.  Coronary artery disease.     Electronically Signed   By: Eddie Candle [name]   On: 05/12/2019 13:14  ROS - per HPI   OV 01/04/2020  Subjective:  Patient ID: [name], male , DOB: 1946/08/18 , age 73 y.o. , MRN: [id number] , [address] [place] [address]   01/04/2020 -   Chief Complaint  Patient presents with   Follow-up    Pt states he has been doing good since last visit. States he still has an occ cough.    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 Rx Rituxan since approx Oct/Nov 2018. On Background Immuran, prednisone and  = high risk medication use     HPI [name] 76 y.o. -returns for follow-up.  Last seen in the summer 2020.  Overall he is doing stable.  He continues with his immunosuppressive medications through rheumatology.  This include Imuran prednisone and Rituxan.  He had his infusion of Rituxan this week.  He feels stable but his pulmonary function test done for this visit shows a decline.  It is documented below his walking desaturation test also is a decline from the past but similar to last visit 6 months or so ago.  His main symptom is chronic cough.  [name] labeled it as a level 3 out of 5 but it appears it might actually be more severe.  [name] asking for palliative relief of cough.  His last liver function test with [place] was in the summer 2020.  It appears per review of the chart in the interim he has seen cardiology Dr. Percival Spanish for his coronary artery  calcification and is on expectant follow-up for that.  No stress test has been recommended because of the lack of chest pain.  His wife who is usually here with him is not here today.  He said she slept in.  [name] on MDI - no emphysema noted in prior CT    ROS - per HPI  OV 02/15/2020  Subjective:  Patient ID: [name], male , DOB: 04/05/1946 , age 32 y.o. , MRN: [id number] , ADDRESS: [place] [address]   02/15/2020 -   Chief Complaint  Patient presents with   Follow-up    Follow-up interstitial lung disease UIP pattern associated with rheumatoid arthritis.   - Rx Rituxan since approx Oct/Nov 2018. On Background Immuran, prednisone and  = high risk medication use  - started ofev mid -  end of march 2021 for progressive disease    HPI [name] 76 y.o. -he has progressive ILD because of rheumatoid arthritis UIP pattern.  Last visit started him on nintedanib.  [name] now here to follow-up for tolerance and monitoring of nintedanib because this requires intensive therapeutic monitoring.  [name] also immunosuppressed.  He tells me that he is now started nintedanib approximately 1 month ago.  [name] tolerating it fine without any nausea vomiting diarrhea.  Last visit he took the consent  [name] for the ILD-pro registry study.  [name] agreed now to participate in it.  He meets inclusion exclusion criteria because of progressive disease.  At this point in time he is got minimal symptoms.  He desaturated as before.  However he is not compliant with his portable oxygen with exertion.  His cough is improved because of Hycodan.   OV 05/01/2020  Subjective:  Patient ID: [name], male , DOB: 04-Dec-1945 , age 58 y.o. , MRN: [id number] , ADDRESS: [place] [address]   05/01/2020 -   Chief Complaint  Patient presents with   Follow-up    pt is here to to disuss ild    OV 05/30/2020   Subjective:  Patient ID: [name], male , DOB:  Feb 19, 1946, age 71 y.o. years. , MRN: [id number],  ADDRESS: [address] [place] [address] PCP  Merrilee Seashore, MD Providers : Treatment Team:  Attending Provider: Brand Males, MD   Chief Complaint  Patient presents with   Follow-up    Pt states he has been doing good since last visit and states his breathing is about the same.     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   - Rx Rituxan since approx Oct/Nov 2018. On Background Immuran, prednisone and  = high risk medication use  - started ofev mid -  end of march 2021 for progressive disease -> changed to [facility]  June 2021    HPI [name] 76 y.o. -presents for follow-up.  This time he has brought his wife.  Have not seen the wife in a long time.  She appears to have lost weight.  She says she is dealing with her own health issues of unexplained weight loss.  She says she is abreast of his health issues when he returns home after visits.  However patient continues to be stoic and always states he is feeling well.  Last time he said he was feeling well but had high level of symptoms.  This time also he says he is feeling well and gaining weight and has marked very low level of symptoms.  Therefore overall his history is unreliable.  In the interim he has had his right heart catheterization.  He had nonobstructive coronary artery disease his.  His mean pulmonary artery pressure was elevated at 25.  Wedge pressure could not be elucidated.  His peripheral vascular resistance appears greater than 3.  Therefore he qualifies for nebulized treprostinil.  However he still in the uptake with his pirfenidone.  [name] not applying sunscreen and he says he will.  [name] tolerating pirfenidone well.  He has gained weight no nausea no vomiting no diarrhea.  He uses 4 Steven of pulsed oxygen at rest.  However at room air at rest he was normal.  He desaturated only after walking 2 laps.    OV  08/28/2020   Subjective:  Patient ID: [name], male , DOB: [birth year], age 68 y.o. years. , MRN: [id number],  ADDRESS: [address] [place] [address] PCP  Merrilee Seashore, MD Providers : Treatment Team:  Attending Provider: Brand Males, MD Patient Care Team: Merrilee Seashore, MD as PCP - General (Internal Medicine) Hennie Duos, MD as Consulting Physician (Rheumatology) Lauraine Rinne, NP as Nurse Practitioner (Pulmonary Disease)    Chief Complaint  Patient presents with   Follow-up    coughs when starts eating.      Follow-up interstitial lung disease  UIP pattern associated with rheumatoid arthritis.   - Rx Rituxan since approx Oct/Nov 2018. On Background Immuran, prednisone and  = high risk medication use  - started ofev mid -  end of march 2021 for progressive disease -> changed to [facility]  June 2021 -> stopped sept 2021 due to rash     HPI [name] 76 y.o. -returns for follow-up.  At this visit he was supposed to do spirometry and DLCO but he has not.  He presents by himself.  It is unclear why he did not do spirometry and DLCO.  As usual he is stoic and does not give much of a history.  His symptom score appears stable.  His weight appears stable.  He uses 4 Steven of oxygen.  Between the last 2 visits he made phone calls suggesting initially that the pirfenidone was causing a rash in his upper arm but later the phone call said Spiriva.  We indicated that he should stop the Spiriva.  Today he tells me that he is actually continuing his Spiriva.  He tells me that the rash was caused by the pirfenidone/Esbriet.  Initially did not seem sure it was because of the [place] but latter when I repeated questions in multiple different ways he indicated it was desperate causing the rash therefore he stopped it.  He has been 1 month since he stopped it and the rash is disappeared.  The rash was in a nonexposed area in the right upper extremity.  He has had his Covid  vaccine but has not had his booster.  He continues to be on Rituxan which is tolerating well.  I did indicate to him that in the absence of B cells he might not have antibodies and he should get his Covid booster.  We will check Covid IgG as follow-up post booster needs continued intensive monitoring due to high complex status and immunosuppression    OV 12/03/2020  Subjective:  Patient ID: [name], male , DOB: 14-Jun-1946 , age 63 y.o. , MRN: [id number] , ADDRESS: [address] [place] [address] PCP Merrilee Seashore, MD Patient Care Team: Merrilee Seashore, MD as PCP - General (Internal Medicine) Minus Breeding, MD as PCP - Cardiology (Cardiology) Hennie Duos, MD as Consulting Physician (Rheumatology) Lauraine Rinne, NP as Nurse Practitioner (Pulmonary Disease)  This Provider for this visit: Treatment Team:  Attending Provider: Brand Males, MD    12/03/2020 -   Chief Complaint  Patient presents with   Follow-up    F/U after hospital stay. States he has been feeling well since being home.      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   - Rx Rituxan since approx Oct/Nov 2018. On Background Immuran, prednisone and  = high risk medication use  - started ofev mid -  end of march 2021 for progressive disease -> changed to [facility]  June 2021 -> stopped sept 2021 due to rash  -Participant in ILD-pro registry study   Pulm HTn [place] 11/18/20 v- Dr Glori Bickers  Repeat [place] yesterday c/w mild to moderate pulmonary HTN with high cardiac output. PA = 59/18 (34). PCW = 10. PVR = 3.1  - Given PAH in setting of IPF, a trial of inhaled treprostinil seems reasonable    12/03/2020 [name] returns for follow-up.  After my last visit in October 2021 he decompensated in January 2022 and admitted to the hospital intensive care unit needing BiPAP for respiratory failure  in January 2022 last month.  I was in touch with the intensive  care physician Dr. Doyne Keel and heart failure specialist Dr. Haroldine Laws during this time.  They did a right heart catheterization in the results are above.  Patient does have WHO group 3 pulmonary hypertension.  Inhaled treprostinil is indicated.  Dr. Clayborne Dana pharmacist [facility] working on this.  I just touch base with Dr. Haroldine Laws about this.  Patient is here with his wife.  He says he is much improved.  [name] back to his 3 Steven nasal cannula at baseline.  He reports having had his Covid booster in October 2021.  He had his booster after we found that his Covid IgG was negative.  Of note he is on Rituxan which suppresses IgG production.  He and his wife are interested in future therapeutic options.  [name] a participant in the ILD-pro registry.        OV 02/27/2021  Subjective:  Patient ID: [name], male , DOB: [birth year] , age 54 y.o. , MRN: [id number] , ADDRESS: [address] [place] [address] PCP Merrilee Seashore, MD Patient Care Team: Merrilee Seashore, MD as PCP - General (Internal Medicine) Minus Breeding, MD as PCP - Cardiology (Cardiology) Hennie Duos, MD as Consulting Physician (Rheumatology) Lauraine Rinne, NP as Nurse Practitioner (Pulmonary Disease)  This Provider for this visit: Treatment Team:  Attending Provider: Brand Males, MD    02/27/2021 -   Chief Complaint  Patient presents with   Follow-up    Doing ok, breathing is stable     HPI [name] 76 y.o. -returns for follow-up.  He presents with his wife.  He endorses stability.  His symptom score shows worsening but I suspect this is unreliable because he never gives a good history.  He had pulmonary function test that shows improvement compared to the most recent 1 but still worse compared to February 2021.  In the interim he has been started on inhaled treprostinil as of March 2022.  There is some subset analysis evidence to suggest treprostinil can modulte PFTs [and is the question being  raised in the upcoming tETON trial and for IPF] he is on his immunomodulator treatment is finished COVID prophylaxis monoclonal antibody.  [name] able to participate today in the ILD Pro registry visit.      OV 06/30/2021  Subjective:  Patient ID: [name], male , DOB: Dec 27, 1945 , age 62 y.o. , MRN: [id number] , ADDRESS: [address] [place] [address] PCP Merrilee Seashore, MD Patient Care Team: Merrilee Seashore, MD as PCP - General (Internal Medicine) Minus Breeding, MD as PCP - Cardiology (Cardiology) Hennie Duos, MD as Consulting Physician (Rheumatology) Lauraine Rinne, NP as Nurse Practitioner (Pulmonary Disease)  This Provider for this visit: Treatment Team:  Attending Provider: Melvenia Needles, NP    06/30/2021 -   Chief Complaint  Patient presents with   Follow-up    Pt states coughing.    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   - Rx Rituxan since approx Oct/Nov 2018. On Background Immuran, prednisone and  = high risk medication use  - started ofev mid -  end of march 2021 for progressive disease -> changed to [facility]  June 2021 -> stopped sept 2021 due to rash -> not on anti-foibritc  -Participant in ILD-pro registry study   Pulm HTn [place] 11/18/20 - Dr Glori Bickers  Repeat [place] yesterday c/w mild to moderate  pulmonary HTN with high cardiac output. PA = 59/18 (34). PCW = 10. PVR = 3.1  -  started tyvaso early march 2022 - through [facility] Riles - CHF Pharmacist  Associated mild emphysema [previous intolerance to  Spiriva)  - feb 2022 On inceuse  COvid prophylassix (IgG negative Feb 2022)  - tixagevimab/cilgavimab, for pre-exposure prophylaxis  Feb 2022 -= repeat dosing March 2022  HPI Jag Grumbine 76 y.o. -returns for follow-up.  He presents with his wife.  He tells me that overall he is doing stable.  [name] not on antifibrotic's symptom score shows stability.  Pulmonary function test done?  Declining  versus stable.  See below.  [name] using 4 Steven of oxygen.  Wife states his cough is worse.  He then acknowledged that his cough is worse.  Wife rated as a 5 out of 5.  He says it does not affect his quality of life.  As always there is a discrepancy between his history and her history.  Nevertheless she feels it significant.  The sputum is clear.  There is no wheezing associated with this.  He has tried [employer] and other over-the-counter treatments but these have not helped.  We discussed doing Hycodan opioid cough relief for palliative management of cough in the setting of pulmonary fibrosis.  [name] open to this idea.  [name] taking inhaled Tyvaso for his pulmonary hypertension 4 times a day each time 9 puffs.  He feels it is helping.  At this point he is maxed out on therapy.  [name] doing prednisone, Imuran and Rituxan.  This for his rheumatoid arthritis.  If he declines he can always add CellCept.  [name] a participant in the research registry study.  His 8-monthvisit is anytime between now and November.  [name] willing to do his research visit today.   OV 11/24/2021  Subjective:  Patient ID: LAngelena [name] , DOB: [id number], age 76y.o. , MRN: [id number], ADDRESS: [address] [place] [id number] RMerrilee Seashore MD Patient Care Team: RMerrilee Seashore MD as PCP - General (Internal Medicine) HMinus Breeding MD as PCP - Cardiology (Cardiology) BHennie Duos MD as Consulting Physician (Rheumatology) MLauraine Rinne NP as Nurse Practitioner (Pulmonary Disease)  This Provider for this visit: Treatment Team:  Attending Provider: RBrand Males MD    11/24/2021 -   Chief Complaint  Patient presents with   Follow-up    F/U after SArlyce Harman States he has been stable since last visit. No new symptoms. Still using 4L of O2.     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   - Rx Rituxan since approx Oct/Nov 2018. On Background  Immuran, prednisone and  = high risk medication use  - started ofev mid -  end of march 2021 for progressive disease -> [facility] -> stopped sept 2021 due to rash -> not on anti-foibritc  -Participant in ILD-pro registry study   [place] DGlori Bickers Repeat ROlowaluyesterday c/w mild to moderate pulmonary HTN with high cardiac output. PA = 59/18 (34). PCW = 10. PVR = 3.1  -  started tyvaso early march 2022 - through LAudry Riles- CHF Pharmacist  Associated mild emphysema [previous intolerance to  Spiriva)  - feb 2022 On inceuse  COvid prophylassix (IgG negative Feb 2022)  - tixagevimab/cilgavimab, for pre-exposure prophylaxis  Feb 2022 - repeat dosing  March 2022  Chronic cough on -Hycodan opioid  HPI [name] 76 y.o. -presents for routine follow-up.  At this point in time he is Rituxan, Imuran and prednisone through the rheumatologist.  [name] on high risk prescription.  [name] not on any antifibrotic's because of side effects.  [name] getting inhaled treprostinil nebulizer through cardiology for his WHO group 3 pulmonary hypertension.  For his associated emphysema is run out of Incruse.  His cough is well controlled but for the last few weeks he is having increased yellow sputum but there is no change in his baseline wheeze which he continues to have intermittently.  Overall he states his symptoms are stable.  However on the symptom score below symptoms have progressed but again I doubt his accuracy and answering these questions.  [name] participant in ILD-Pro registry.  According to the research assistant next visit is only in April 2023.  He had pulmonary function test today and shows continued stability and personal interpretation.  Recently but overlal progression.       SYMPTOM SCALE - ILD 06/29/2019  01/04/2020  02/15/2020  05/01/2020  05/30/2020  08/28/2020  02/27/2021 On tyvasos 06/30/2021  11/24/2021 tyvaso  O2 use Uses 3-5L with  exertion yuses 3-5L with exertion Non compliant 168# and 4L with exertion - on esbriet 172# on esbriet - 4L at rest  - filled bu him and wife 174# 4L o2 172# 4L O2  169#. 4L  Shortness of Breath 0 -> 5 scale with 5 being worst (score 6 If unable to do)          At rest 3 0 0 4 0 0 2 0 5  Simple tasks - showers, clothes change, eating, shaving 2 1 0 5 0 0 _0 Household (dishes, doing bed, laundry) 2 1 0 5 0  0 2 2  Shopping _1 0 0 0 2 2  Walking level at own pace _2 0 0 _3 Walking up Stairs _4 Total (40 - 48) Dyspnea Score _5 How bad is your cough? _6 How bad is your fatigue 2 1 0 4 0 0 _7 nasuea    4 0 0 0 2 2  vomit    0 0 0 0 00 0  diarrhea  0 0 0 0 0 0 0 0  anxiety  0 0 0 0 0 0 00 0  deipression  0 0 0 0 0 0 0 0       Simple office walk 08/01/2018 -  185 feet x  3 laps goal with forehead probe at [place]  10/06/2018 250 feet x 3 laps 06/29/2019  01/04/2020  02/15/2020  05/30/2020  12/03/2020 Using 3L [place] at home all time 02/27/2021 Uses 4L at home  O2 used _8  raoom air after turing o2 off fx 5 mn Room air Room air  Number laps completed 3 3 Aim for 3 1 Did only 1 of 3 Did all 3 Did only 2 laps Did only 0.5 lap desats at 0.5 of 3 Steven;aps  Comments about pace normal normal  avg pace avg opae avg pagce avg pace   Resting Pulse Ox/HR 100% and 80/min 100% and 81/min 94%  and 96/min 96% and 101/min 99% and 98/min 96% and 94/min 96% and HR 96 89% RA -> 95 %  Final Pulse Ox/HR 94% and 114/min 91% and 111/min 86% at first lap, HR 131 86% and 118/min 88% and 123/min 83% and 125/,n 85% and HR 124 at half lap 87% at half lap  Desaturated </= 88% no no  yes yes     Desaturated <= 3% points yes yes  yes yes     Got Tachycardic >/= 90/min yes yes  yes yes     Symptoms at end of test x x  Mild dyspnea dyspnea     Miscellaneous comments x x Needed 5L [place] to correct    Corrected with 4L [place]  and did noly 2 laps          PFT  PFT Results Latest Ref Rng & Units 11/24/2021 06/30/2021 02/27/2021 04/08/2020 12/26/2019 08/01/2018 12/20/2017  FVC-Pre Steven 1.78 1.76 1.85 1.73 1.96 2.09 2.10  FVC-Predicted Pre % 56 56 58 54 61 69 65  FVC-Post Steven - - - - - - -  FVC-Predicted Post % - - - - - - -  Pre FEV1/FVC % % 85 87 89 90 86 89 88  Post FEV1/FCV % % - - - - - - -  FEV1-Pre Steven 1.52 1.53 1.65 1.55 1.69 1.87 1.84  FEV1-Predicted Pre % 66 66 71 66 72 84 77  FEV1-Post Steven - - - - - - -  DLCO uncorrected ml/min/mmHg 6.59 7.62 10.01 8.10 13.14 13.24 12.30  DLCO UNC% % 29 34 45 36 58 52 45  DLCO corrected ml/min/mmHg 6.59 7.62 10.01 9.15 - - -  DLCO COR %Predicted % 29 34 45 41 - - -  DLVA Predicted % 46 55 67 68 88 95 80  TLC Steven - - - - - - -  TLC % Predicted % - - - - - - -  RV % Predicted % - - - - - - -       has a past medical history of Arthritis, COPD (chronic obstructive pulmonary disease) ([place]), Diabetes mellitus, Hypertension, Pneumonia, and Thyroid disease.   reports that he quit smoking about 37 years ago. His smoking use included cigarettes. He has a 5.00 pack-year smoking history. He has never used smokeless tobacco.  Past Surgical History:  Procedure Laterality Date   KNEE ARTHROSCOPY     NO PAST SURGERIES     RIGHT HEART CATH N/A 11/18/2020   Procedure: RIGHT HEART CATH;  Surgeon: Jolaine Artist, MD;  Location: [facility];  Service: Cardiovascular;  Laterality: N/A;   RIGHT/LEFT HEART CATH AND CORONARY ANGIOGRAPHY N/A 05/21/2020   Procedure: RIGHT/LEFT HEART CATH AND CORONARY ANGIOGRAPHY;  Surgeon: Martinique, Peter M, MD;  Location: [facility];  Service: Cardiovascular;  Laterality: N/A;    Allergies  Allergen Reactions   Bactrim [Sulfamethoxazole-Trimethoprim] Shortness Of Breath    Patient states "got short winded"   Spiriva Respimat [Tiotropium Bromide Monohydrate] Rash   Ofev [Nintedanib]     Immunization History  Administered Date(s)  Administered   Fluad Quad(high Dose 65+) 08/12/2020   Influenza Split 08/03/2011, 07/03/2012, 09/02/2013, 07/03/2014   Influenza, High Dose Seasonal PF 07/30/2014, 07/15/2016, 08/02/2016, 08/02/2017, 07/25/2018, 07/04/2019   Influenza, Quadrivalent, Recombinant, Inj, Pf 08/11/2017, 07/05/2018, 07/11/2019, 08/15/2020   Influenza,inj,Quad PF,6+ Mos 07/29/2015   Influenza-Unspecified 07/23/2012   PFIZER(Purple Top)SARS-COV-2 Vaccination 01/01/2020, 01/13/2020, 02/05/2020, 08/31/2020   Pneumococcal Conjugate-13 08/20/2015, 08/01/2018   Pneumococcal  Polysaccharide-23 11/03/2007, 12/04/2007, 01/12/2018   Zoster Recombinat (Shingrix) 04/11/2019    Family History  Problem Relation Age of Onset   Diabetes Mother    Stroke Mother      Current Outpatient Medications:    acetaminophen (TYLENOL) 500 MG tablet, Take 1,000 mg by mouth every 6 (six) hours as needed for moderate pain or headache., Disp: , Rfl:    albuterol (VENTOLIN HFA) 108 (90 Base) MCG/ACT inhaler, Inhale 2 puffs into the lungs every 6 (six) hours as needed., Disp: , Rfl:    amLODipine (NORVASC) 10 MG tablet, Take 10 mg by mouth daily., Disp: , Rfl:    aspirin 81 MG tablet, Take 81 mg by mouth daily., Disp: , Rfl:    atorvastatin (LIPITOR) 20 MG tablet, Take 20 mg by mouth daily., Disp: , Rfl: 2   azaTHIOprine (IMURAN) 50 MG tablet, Take 100 mg by mouth daily. , Disp: , Rfl:    doxycycline (VIBRA-TABS) 100 MG tablet, Take 1 tablet (100 mg total) by mouth 2 (two) times daily., Disp: 10 tablet, Rfl: 0   fluticasone (FLONASE) 50 MCG/ACT nasal spray, Place 2 sprays into both nostrils daily., Disp: 16 g, Rfl: 2   folic acid (FOLVITE) 160 MCG tablet, Take 400 mcg by mouth daily., Disp: , Rfl:    HYDROcodone bit-homatropine (HYCODAN) 5-1.5 MG/5ML syrup, Take 5 mLs by mouth 2 (two) times daily as needed for cough., Disp: 240 mL, Rfl: 0   insulin NPH Human (NOVOLIN N) 100 UNIT/ML injection, Inject 12 Units into the skin See admin  instructions. Inject 10 units every morning and 20 units every evening, Disp: , Rfl:    Insulin Syringe-Needle U-100 (INSULIN SYRINGE .3CC/31GX5/16") 31G X 5/16" 0.3 ML MISC, Inject 1 Syringe into the skin as directed., Disp: , Rfl:    levothyroxine (SYNTHROID, LEVOTHROID) 25 MCG tablet, Take 25 mcg by mouth daily., Disp: , Rfl:    metFORMIN (GLUCOPHAGE-XR) 500 MG 24 hr tablet, Take 2 tablets (1,000 mg total) by mouth 2 (two) times daily., Disp: , Rfl:    pantoprazole (PROTONIX) 40 MG tablet, Take 40 mg by mouth daily., Disp: , Rfl:    predniSONE (DELTASONE) 1 MG tablet, Take 3 mg by mouth daily., Disp: , Rfl:    Treprostinil (TYVASO) 0.6 MG/ML SOLN, Inhale 18 mcg into the lungs 4 (four) times daily., Disp: , Rfl:    umeclidinium bromide (INCRUSE ELLIPTA) 62.5 MCG/ACT AEPB, Inhale 1 puff into the lungs daily., Disp: 30 each, Rfl: 5      Objective:   Vitals:   11/24/21 0925  BP: 116/74  Pulse: 89  SpO2: 98%  Weight: 169 lb 3.2 oz (76.7 kg)  Height: _0  (1.676 m)    Estimated body mass index is 27.31 kg/m as calculated from the following:   Height as of this encounter: _1  (1.676 m).   Weight as of this encounter: 169 lb 3.2 oz (76.7 kg).  _2 @  [facility] Weights   11/24/21 0925  Weight: 169 lb 3.2 oz (76.7 kg)     Physical Exam General: No distress. Looks well Neuro: Alert and Oriented x 3. GCS 15. Speech normal Psych: Pleasant Resp:  Barrel Chest - no.  Wheeze - no, Crackles - yes, No overt respiratory distress CVS: Normal heart sounds. Murmurs - no Ext: Stigmata of Connective Tissue Disease - no HEENT: Normal upper airway. PEERL +. No post nasal drip        Assessment:       ICD-10-CM  1. Interstitial lung disease due to connective tissue disease ([place])  J84.89    M35.9     2. WHO group 3 pulmonary arterial hypertension (HCC)  I27.23     3. Chronic respiratory failure with hypoxia (HCC)  J96.11     4. Therapeutic drug monitoring  Z51.81     5.  Chronic cough  R05.3          Plan:     Patient Instructions  Interstitial lung disease due to connective tissue disease ([place]) Chronic respiratory failure with hypoxia (HCC) Immunosuppressed and on high risk medication  -Glad you are stable.  Pulmonary function test shows stability  Plan -At this point in time no antifibrotic's because of side effects -Only  below immunomodulator therapy for rheumatoid arthritis which we hope will help your interstitial lung disease - in future can consider interventional clinical trial -Continue supportive care with oxygen therapy and physical conditioning -Immunomodulator therapy through the rheumatologist -Continue participation in ILD-pro registry  -next visit April 2023 per research team --If there are other research protocols for you we will advise you -You need continued close monitoring intensive monitoring because of your high risk immunosuppressed status  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   -controlled this visit  Plan  - hycodan 77m twice daily as neded  (if you want refill on this)   WHO group 3 pulmonary arterial hypertension ([place]  -You have this based on right heart catheterization in January 2022 - you are inhaled treprostinil since late February 2022/early March 2022 -I believe this is helping you as evidenced by improved  improved.stable  pulmonary function test  Plan -  continue inhaled treprostinil through LAudry Riles- talk to her and change to DPI inhaler as oposed to nebulizer  Mild emphysema  Plan  - refill incruse  Acute bronchitis - possible this vsiti 11/24/2021  Plan  - Take doxycycline 1089mpo twice daily x 5 days; take after meals and avoid sunlight    Follow-up - Return to see Dr. RaChase Calleror standard of care visit in 30-minute slot in 3-4 months  = ild symptom score and walk test at followup    SIGNATURE    Dr. MuBrand MalesM.D., [name],  Pulmonary and Critical Care Medicine Staff  Physician, Co[place]roscogginirector - Interstitial Lung Disease  Program  Pulmonary [id number]: [phone] no answer or between  15:00h - 7:00h: call [phone] Telephone: [phone]  10:19 AM 11/24/2021

## 2021-11-24 NOTE — Patient Instructions (Addendum)
Interstitial lung disease due to connective tissue disease ([place]) Chronic respiratory failure with hypoxia (HCC) Immunosuppressed and on high risk medication  -Glad you are stable.  Pulmonary function test shows stability  Plan -At this point in time no antifibrotic's because of side effects -Only  below immunomodulator therapy for rheumatoid arthritis which we hope will help your interstitial lung disease - in future can consider interventional clinical trial -Continue supportive care with oxygen therapy and physical conditioning -Immunomodulator therapy through the rheumatologist -Continue participation in ILD-pro registry  -next visit April 2023 per research team --If there are other research protocols for you we will advise you -You need continued close monitoring intensive monitoring because of your high risk immunosuppressed status  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   -controlled this visit  Plan  - hycodan 4m twice daily as neded  (if you want refill on this)   WHO group 3 pulmonary arterial hypertension ([place]  -You have this based on right heart catheterization in January 2022 - you are inhaled treprostinil since late February 2022/early March 2022 -I believe this is helping you as evidenced by improved  improved.stable  pulmonary function test  Plan -  continue inhaled treprostinil through LAudry Riles- talk to her and change to DPI inhaler as oposed to nebulizer  Mild emphysema  Plan  - refill incruse  Acute bronchitis - possible this vsiti 11/24/2021  Plan  - Take doxycycline 1077mpo twice daily x 5 days; take after meals and avoid sunlight    Follow-up - Return to see Dr. RaChase Calleror standard of care visit in 30-minute slot in 3-4 months  = ild symptom score and walk test at followup

## 2021-11-24 NOTE — Progress Notes (Signed)
Spirometry and DLCO completed today  ?

## 2021-12-09 ENCOUNTER — Telehealth: Payer: Self-pay | Admitting: Internal Medicine

## 2021-12-10 NOTE — Telephone Encounter (Signed)
Spoke with the pt's spouse  She states pt needing refill on hycodan  His cough is about the same since last visit  Last given rx 11/24/21 #240 ml   I have pended rf to [facility] [place]  Please let us know once you have signed, thanks!

## 2021-12-12 DIAGNOSIS — D72819 Decreased white blood cell count, unspecified: Secondary | ICD-10-CM | POA: Diagnosis not present

## 2021-12-12 DIAGNOSIS — Z79899 Other long term (current) drug therapy: Secondary | ICD-10-CM | POA: Diagnosis not present

## 2021-12-12 DIAGNOSIS — J9611 Chronic respiratory failure with hypoxia: Secondary | ICD-10-CM | POA: Diagnosis not present

## 2021-12-12 DIAGNOSIS — Z13820 Encounter for screening for osteoporosis: Secondary | ICD-10-CM | POA: Diagnosis not present

## 2021-12-12 DIAGNOSIS — E109 Type 1 diabetes mellitus without complications: Secondary | ICD-10-CM | POA: Diagnosis not present

## 2021-12-12 DIAGNOSIS — E559 Vitamin D deficiency, unspecified: Secondary | ICD-10-CM | POA: Diagnosis not present

## 2021-12-12 DIAGNOSIS — M150 Primary generalized (osteo)arthritis: Secondary | ICD-10-CM | POA: Diagnosis not present

## 2021-12-12 DIAGNOSIS — J849 Interstitial pulmonary disease, unspecified: Secondary | ICD-10-CM | POA: Diagnosis not present

## 2021-12-12 DIAGNOSIS — M0589 Other rheumatoid arthritis with rheumatoid factor of multiple sites: Secondary | ICD-10-CM | POA: Diagnosis not present

## 2021-12-15 MED ORDER — HYDROCODONE BIT-HOMATROP MBR 5-1.5 MG/5ML PO SOLN: 5.0000 mL | Freq: Two times a day (BID) | ORAL | 0 refills | Status: DC | PRN

## 2021-12-15 NOTE — Telephone Encounter (Signed)
Called and spoke with patient's wife. She is aware that MR has sent in the cough syrup.  Nothing further needed at time of call.

## 2021-12-15 NOTE — Telephone Encounter (Signed)
done

## 2021-12-17 ENCOUNTER — Telehealth: Payer: Self-pay | Admitting: Pharmacist

## 2021-12-17 NOTE — Telephone Encounter (Signed)
Received call from Audry Riles at [facility] clinic. She states she does not follow him for Tyvaso management - believes she had initially helped when patient inpatient. Pharmacy team will initiate Tyvaso DPI referral form. Lauren had reached out to [facility] rep who stated that patient assistance application was pending provider form. She provided fax number to pulm clinic for [place] to complete   Placed in Dr. Golden Pop mailbox to be signed and awaiting [place] assist patient assistance provider form  Knox Saliva, PharmD, MPH, BCPS Clinical Pharmacist (Rheumatology and Pulmonology)

## 2021-12-19 NOTE — Telephone Encounter (Signed)
[facility] [employer] paperwork received for Tyvaso DPI. Placed in Dr. Golden Pop mailbox for signature  Knox Saliva, PharmD, MPH, BCPS Clinical Pharmacist (Rheumatology and Pulmonology)

## 2021-12-23 DIAGNOSIS — J84112 Idiopathic pulmonary fibrosis: Secondary | ICD-10-CM | POA: Diagnosis not present

## 2021-12-23 DIAGNOSIS — J8489 Other specified interstitial pulmonary diseases: Secondary | ICD-10-CM | POA: Diagnosis not present

## 2021-12-23 DIAGNOSIS — I2723 Pulmonary hypertension due to lung diseases and hypoxia: Secondary | ICD-10-CM | POA: Diagnosis not present

## 2021-12-25 NOTE — Telephone Encounter (Signed)
Faxed [facility] [employer]ist rx form for Tyvaso DPI. Called patient to confirm dose as we transition him - he takes 9 puffs four times daily. Fax: [phone] Phone: [phone]  Faxed referral form to [place] for Tyvaso DPI with note that patient is transitioning Fax: [phone] Phone: [phone]  Knox Saliva, PharmD, MPH, BCPS Clinical Pharmacist (Rheumatology and Pulmonology)

## 2021-12-29 NOTE — Telephone Encounter (Signed)
Submitted a Prior Authorization request to [facility] for TYVASO DPI via CoverMyMeds. Will update once we receive a response.  KeyKarie Georges PA Case ID: [id number]  [facility] Ass[employer] renewal provider form submitted to [facility] [employer] via fax Fax: [phone]  Knox Saliva, PharmD, MPH, BCPS Clinical Pharmacist (Rheumatology and Pulmonology)

## 2021-12-29 NOTE — Telephone Encounter (Signed)
Received notification from [facility] regarding a prior authorization for  TYVASO DPI . Authorization has been APPROVED from 12/29/21 to 11/01/22.   Unable to run test claim as Tyvaso is limited distribution  Authorization # [id number] Phone # [phone]  Will fax approval letter to [facility] Fax: [phone] Phone: [phone]  Knox Saliva, PharmD, MPH, BCPS Clinical Pharmacist (Rheumatology and Pulmonology)

## 2021-12-31 ENCOUNTER — Telehealth: Payer: Self-pay | Admitting: Internal Medicine

## 2021-12-31 NOTE — Telephone Encounter (Signed)
Called and spoke with pt stating to him that he needs to call PCP for appetite stimulant. Pt verbalized understanding. Nothing further needed. ?

## 2022-01-08 DIAGNOSIS — E782 Mixed hyperlipidemia: Secondary | ICD-10-CM | POA: Diagnosis not present

## 2022-01-08 DIAGNOSIS — I10 Essential (primary) hypertension: Secondary | ICD-10-CM | POA: Diagnosis not present

## 2022-01-08 DIAGNOSIS — E1022 Type 1 diabetes mellitus with diabetic chronic kidney disease: Secondary | ICD-10-CM | POA: Diagnosis not present

## 2022-01-08 DIAGNOSIS — N182 Chronic kidney disease, stage 2 (mild): Secondary | ICD-10-CM | POA: Diagnosis not present

## 2022-01-08 DIAGNOSIS — M0589 Other rheumatoid arthritis with rheumatoid factor of multiple sites: Secondary | ICD-10-CM | POA: Diagnosis not present

## 2022-01-08 NOTE — Telephone Encounter (Signed)
Received a fax from   [employer]  regarding an approval for TYVASO DPI patient assistance from 01/08/2022 to 01/09/2023.  ? ?Patient will be able to get a 30-day supply of medication at a time, Rx has been forwarded to [place] [phone]) who will reach out and coordinate shipment with the pt. ? ?Approval letter sent to scan center. ? ?[employer] phone# [phone] ?

## 2022-01-12 NOTE — Addendum Note (Signed)
Addended by: Cassandria Anger on: 01/12/2022 02:08 PM ? ? Modules accepted: Orders ? ?

## 2022-01-12 NOTE — Telephone Encounter (Signed)
Received fax from [place] stating that patient's case has been closed through pt assistance as [facility] advised that patient support is not needed at this time  ? ?[place] Case ID: [id number] ? ?Knox Saliva, PharmD, MPH, BCPS ?Clinical Pharmacist (Rheumatology and Pulmonology) ? ?

## 2022-01-14 DIAGNOSIS — N182 Chronic kidney disease, stage 2 (mild): Secondary | ICD-10-CM | POA: Diagnosis not present

## 2022-01-14 DIAGNOSIS — E782 Mixed hyperlipidemia: Secondary | ICD-10-CM | POA: Diagnosis not present

## 2022-01-14 DIAGNOSIS — I10 Essential (primary) hypertension: Secondary | ICD-10-CM | POA: Diagnosis not present

## 2022-01-14 DIAGNOSIS — J9611 Chronic respiratory failure with hypoxia: Secondary | ICD-10-CM | POA: Diagnosis not present

## 2022-01-14 DIAGNOSIS — J849 Interstitial pulmonary disease, unspecified: Secondary | ICD-10-CM | POA: Diagnosis not present

## 2022-01-14 DIAGNOSIS — I700 Atherosclerosis of aorta: Secondary | ICD-10-CM | POA: Diagnosis not present

## 2022-01-14 DIAGNOSIS — E1022 Type 1 diabetes mellitus with diabetic chronic kidney disease: Secondary | ICD-10-CM | POA: Diagnosis not present

## 2022-01-21 DIAGNOSIS — J8489 Other specified interstitial pulmonary diseases: Secondary | ICD-10-CM | POA: Diagnosis not present

## 2022-01-21 DIAGNOSIS — I2723 Pulmonary hypertension due to lung diseases and hypoxia: Secondary | ICD-10-CM | POA: Diagnosis not present

## 2022-01-21 DIAGNOSIS — J84112 Idiopathic pulmonary fibrosis: Secondary | ICD-10-CM | POA: Diagnosis not present

## 2022-01-30 DIAGNOSIS — I129 Hypertensive chronic kidney disease with stage 1 through stage 4 chronic kidney disease, or unspecified chronic kidney disease: Secondary | ICD-10-CM | POA: Diagnosis not present

## 2022-01-30 DIAGNOSIS — K219 Gastro-esophageal reflux disease without esophagitis: Secondary | ICD-10-CM | POA: Diagnosis not present

## 2022-01-30 DIAGNOSIS — E1022 Type 1 diabetes mellitus with diabetic chronic kidney disease: Secondary | ICD-10-CM | POA: Diagnosis not present

## 2022-01-30 DIAGNOSIS — N182 Chronic kidney disease, stage 2 (mild): Secondary | ICD-10-CM | POA: Diagnosis not present

## 2022-02-06 ENCOUNTER — Telehealth: Payer: Self-pay | Admitting: Internal Medicine

## 2022-02-06 NOTE — Telephone Encounter (Signed)
Patient is currently on 67mg DPI QID. ?

## 2022-02-06 NOTE — Telephone Encounter (Signed)
Spoke to patient.  ?She stated that he was instructed by home health nurse to stop Tyvaso due to increase coughing and loose bowel movements. He stopped Tyvaso yesterday morning. Sx have subsided since. He stated that the pharmacminst with [facility] would like for him to resume tx.  ?Lm for [facility] pharmacy team.  ? ?MR, please advise. Thanks ?

## 2022-02-06 NOTE — Telephone Encounter (Signed)
Spoke with pt and reviewed Dr. Golden Pop instructions. Pt stated understanding and was able to repeat instructions back to RN. Nothing further needed at the time.  ?

## 2022-02-06 NOTE — Telephone Encounter (Signed)
option 2 then option 1. Leroy Sea also calling about tyvaso. States pt has missed a day and ahlaf and wants to know what MR suggest about going back. Pt is on 40 mg dpi at the moment. Do they need to retitrate or continue back? Routing as urgent per Brad's advice. ?

## 2022-02-06 NOTE — Telephone Encounter (Signed)
Ok start at twice daily next 4 days and then go to three times daily for 4 days and then go to 4 times daily -> if side effect recur at any time to call us ?

## 2022-02-06 NOTE — Telephone Encounter (Signed)
Was he on the dpi or neb? And if DPI - what strenght was he taking? ?

## 2022-02-24 ENCOUNTER — Encounter: Payer: Self-pay | Admitting: Internal Medicine

## 2022-02-24 ENCOUNTER — Ambulatory Visit: Payer: Medicare Other | Admitting: Internal Medicine

## 2022-02-24 VITALS — BP 126/58 | HR 90 | Temp 97.8°F | Ht 66.0 in | Wt 165.2 lb

## 2022-02-24 DIAGNOSIS — J8489 Other specified interstitial pulmonary diseases: Secondary | ICD-10-CM | POA: Diagnosis not present

## 2022-02-24 DIAGNOSIS — M359 Systemic involvement of connective tissue, unspecified: Secondary | ICD-10-CM | POA: Diagnosis not present

## 2022-02-24 DIAGNOSIS — R053 Chronic cough: Secondary | ICD-10-CM

## 2022-02-24 DIAGNOSIS — J9611 Chronic respiratory failure with hypoxia: Secondary | ICD-10-CM | POA: Diagnosis not present

## 2022-02-24 MED ORDER — INCRUSE ELLIPTA 62.5 MCG/ACT IN AEPB: 1.0000 | INHALATION_SPRAY | Freq: Every day | RESPIRATORY_TRACT | 5 refills | Status: DC

## 2022-02-24 NOTE — Patient Instructions (Addendum)
Interstitial lung disease due to connective tissue disease ([place]) ?Chronic respiratory failure with hypoxia (HCC) ?Immunosuppressed and on high risk medication ? ?-Glad you are stable.  Pulmonary function test shows stability in JAn 2023 ? ?Plan ?- do spiro/dlco in 3 months ?-At this point in time no antifibrotic's because of side effects ?--Continue supportive care with oxygen therapy and physical conditioning ?-Immunomodulator therapy through the rheumatologist ? - Rituxan ? - prednisone ? - immuran ?-Continue participation in ILD-pro registry ?--If there are other research protocols for you we will advise you ?-You need continued close monitoring intensive monitoring because of your high risk immunosuppressed status ? 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 ?-controlled this visit ? ?Plan ? - hycodan 50m twice daily as neded  (if you want refill on this) ? ? ?WHO group 3 pulmonary arterial hypertension ([place]- Jan 2022 ? ?- you are inhaled treprostinil since late February 2022/early March 2022 ?-I believe this is helping you as evidenced by improved  improved.stable  pulmonary function test ? ?Plan ?-  continue inhaled treprostinil   DPI inhaler 4 times daily ? ?Mild emphysema ? ?Plan ? - refill incruse ? ? ? ? ?Follow-up ?- Return to see Dr. RChase Callerfor standard of care visit in 30-minute slot in 3-4 months but after PFT ? = ild symptom score and walk test at followup ?

## 2022-02-24 NOTE — Progress Notes (Signed)
OV 01/01/2017  Chief Complaint  Patient presents with   Follow-up    Pt here after PFT. Pt states overall his breathing is unchanged since last OV. Pt c/o occ dry cough. Pt deneis CP/tightness and f/c/s.    Follow-up interstitial lung disease UIP pattern associated with rheumatoid arthritis and immune suppression. Last visit in March 2017. He doesn't that overall he has been stable. He has a new rheumatologist but still at [facility] Dr Casimer Lanius. His wife also says he is stable. However as he started talking to them his wife slowly started telling me that for the last few to several months he is wheezing more than usual and is a little more short of breath than usual. However he denied it. This is compliant with his medications. [name] not on any inhaler therapy. He uses oxygen only at night. Previous CT scans of the chest in September 2016 in March 2017 did not report any associated emphysema. His most recent pulmonary function test is shown that on Combivent. I personally visualized the graph: [name] Kitchen Forced vital capacity is stable with a diffusion capacity seems to decline over time. Walking desaturation test 185 feet 3 laps on room air: He desaturated to 84%. And then we walked him on 2 L oxygen he did not desaturate.   OV 06/23/2017  Chief Complaint  Patient presents with   Follow-up    pt here after CT in 04/2017. Pt states his breathing is unchanged since last OV. Pt c/o interemittent dry cough. Pt denies CP/tightness and f/c/s.      Follow-up interstitial lung disease UIP pattern associated with rheumatoid arthritis and on Imuran. Last visit in March 2018 I was concerned about progressive interstitial lung disease because he started desaturating on room air and his FVC show decline. Currently rated a high-resolution CT chest that confirmed progressive lung disease. We walked him 185 feet 3 laps on room air and he desaturated at third lap to 89%. This is very  similar to previous visit suggesting that  that his decline has been more gradual over the last 1 year. Compared to last visit he feels stable. His wife feels the same. [name] aware of his worsening fibrosis and the potential need to change anti fibrotics  OV 09/27/2017  Chief Complaint  Patient presents with   Follow-up    PFT done today. Pt states that he thinks his breathing has become worse. Has complaints of SOB with exertion, occ. dry cough. Denies any CP. States that he does wear O2 at night, DME: [facility].     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  Patient is on Imuran.  [name] having progressive disease this year 76.  He told my certified medical assistant that he is having progressive shortness of breath but to me he tells me that he is stable.  As always he is a very poor historian.  Wife gives a better history.  He has oxygen at home for the night which she uses but does not use his exertional small portable system even though this helps him.  They do admit that dyspnea is progressive.  In fact pulmonary function test today shows progressive disease.  He also seem to desaturate a little bit more easily.  He continues to be on Imuran.  In talking to him it appears his rheumatologist has now started him on Rituxan.  He has had 2 doses 2 weeks apart with the  most recent one being mid November 2018.  The next dose is at mid May 2019 which is 78-month interval.  He says he is up-to-date with his flu shot but is not had a conversation with his primary care physician about the new shingles vaccine that is inactivated vaccine.  [name] not having much of joint issues.  [name] aware that the Rituxan is specifically designed to slow down the progression of pulmonary fibrosis.  Walking desaturation test 185 feet x3 laps on room air with a forehead probe.  Resting heart rate was 95/min.  Final heart rate 108/min.  Resting pulse ox was 95%.  By the time he  finished his second lap he desaturated to 84%.  He was then placed on 2 L nasal cannula and he was able to maintain his saturation to 95%.   OV 12/20/2017  Chief Complaint  Patient presents with   Follow-up    follow up PFT,SOB with activity/exertion   [name] is here for follow-up because of his progressive UIP pattern pulmonary fibrosis related to rheumatoid arthritis.  At his last visit he was started on Rituxan by rheumatology.  Since then we started him on portable oxygen but he says he is not using it because he does not like to use it.  He does use oxygen at night which is helping.  His next dose of Rituxan is in May 2019.  Both he and his wife attest that since the start of Rituxan his symptoms are stable particularly shortness of breath is not any worse.  In fact pulmonary function test today shows continued ongoing stability with FVC and DLCO would suggest that the Rituxan might be helping him.  There are no new issues.  [name] here to have a shingles vaccine.  I told him about this last visit to talk to primary care physician but he does not remember this.   IMPRESSION: June 2018 1. Continued interval progression of fibrotic interstitial lung disease with extensive honeycombing, asymmetrically involving the right lung, with mild basilar predominance, diagnostic of usual interstitial pneumonia (UIP) . 2. Stable mild reactive mediastinal lymphadenopathy. 3. Aortic atherosclerosis.  One vessel coronary atherosclerosis.     Electronically Signed   By: Delbert Phenix [name]   On: 04/09/2017 10:51   OV 08/01/2018  Subjective:  Patient ID: [name], male , DOB: 04/03/1946 , age 76 y.o. , MRN: [id number] , ADDRESS: [address] [place] [address]   08/01/2018 -   Chief Complaint  Patient presents with   Follow-up    PFT today, increased coughing (non-productive)    Follow-up interstitial lung disease UIP pattern associated with rheumatoid arthritis. Rx Rituxan since approx  Oct/Nov 2018. Background Immuran  HPI [name] 76 y.o. -presents as usual with his wife.  He tells me that he continues on background Imuran.  [name] also on scheduled Rituxan.  He has an infusion coming up in a month or so.  This is according to his wife.  He continues to be a poor historian.  As best as I can gather from both of them is that his shortness of breath is actually a little bit better ever since starting Rituxan..  In fact his PFTs show continued improvement/stability.  With his FVC and DLCO.  However he tells me that his cough might be a little bit worse.  In talking to him it appears that is only mildly worse.  It happens mostly at night when he lies down.  He coughs a few times and then it does not bother him.  The cough is dry.  He denies any postnasal drip or acid reflux disease.  He does not have any fever or sputum production.       OV 10/06/2018  Subjective:  Patient ID: Punit Bellaire, male , DOB: November 17, 1945 , age 60 y.o. , MRN: [id number] , ADDRESS: [address] [place] [address]   10/06/2018 -   Chief Complaint  Patient presents with   Follow-up    f/u pulmonary fibrosis, no symptoms, complaints, breathing doind well     HPI Deshannon Befort 76 y.o. -returns for ILD follow-up in the setting of rheumatoid arthritis.  This time he is not here with his wife who has other schedule conflicts.  He tells me overall he stable.  He has now started Rituxan.  As again he is a poor historian.  I have him starting Rituxan a year ago but he told me that it was only started recently and has had only 1 infusion.  He tells me that he is stable.  His cough is well controlled with this anticholinergic he is using nighttime oxygen.  He says he is compliant with his medications.  Last visit I told him to inquire about using Bactrim prophylaxis in the setting of B-cell depletion in the setting of Rituxan.  He has no idea recollection of this conversation.  So I did tell him that we will check  his G6PD and discuss directly with his rheumatologist about starting Bactrim prophylaxis.  [name] okay with this plan.  Overall dyspnea and cough are stable and it is mild.  Walking desaturation test shows stability.     OV 06/29/2019  Subjective:  Patient ID: Gordy Gnagey, male , DOB: 09/25/1946 , age 63 y.o. , MRN: [id number] , ADDRESS: [address] [place] [address]   Follow-up interstitial lung disease UIP pattern associated with rheumatoid arthritis. Rx Rituxan since approx Oct/Nov 2018. On Background Immuran, prednisone and dapsone for pcp prophylaxois = high risk medication use   06/29/2019 - ILD- RA  followup    HPI Flemming Dicamillo 76 y.o. -presents for follow-up.  I personally saw him December 2019 before the onset of the pandemic.  He tells me that he is doing fair.  He feels he is overall stable.  He continues to be on Rituxan.  He tells me the last dose of this infusion was in May 05, 2019.  This is managed by rheumatology.  In addition he is on Imuran, prednisone.  Also by middle of year my nurse practitioner started him on dapsone for PCP prophylaxis.  He did have a high-resolution CT chest that shows UIP findings and stability since 2018.  However, he did desaturate with exertion when he saw the nurse practitioner just over a month ago.  This is a new finding as documented below.  We walked him again today and again found his desaturation with exertion.  It required 5 L to correct.  In past he has done well with RA at times but at times required 2L. He denies any fever or sputum production.  His previous echocardiogram was in 2013.  Most recent CT scan of the chest notes coronary artery calcification   Noted WBC going down after dapsine start Results for CHEVAS, COLYER (MRN [id number]) as of 06/29/2019 10:19  Ref. Range 05/30/2019 10:17 06/07/2019 09:07 06/13/2019 08:59 06/20/2019 09:50  WBC Latest Ref Range: 4.0 - 10.5 K/uL 8.8 6.2 4.3  3.4 (L)     IMPRESSION: HRCT July 2020 1. No  significant interval change in severe pulmonary fibrosis, which is heterogeneously distributed and most severe in the right lung base, featuring extensive honeycombing and mild traction bronchiectasis. Although findings are not significantly changed compared to most recent examination dated 2018, they are clearly progressed over time in comparison to priors dating back to 2013. Findings remain in a UIP pattern by ATS pulmonary fibrosis criteria   2.  Coronary artery disease.     Electronically Signed   By: Lauralyn Primes [name]   On: 05/12/2019 13:14  ROS - per HPI   OV 01/04/2020  Subjective:  Patient ID: [name], male , DOB: 11/18/1945 , age 66 y.o. , MRN: [id number] , [address] [place] [address]   01/04/2020 -   Chief Complaint  Patient presents with   Follow-up    Pt states he has been doing good since last visit. States he still has an occ cough.    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 Rx Rituxan since approx Oct/Nov 2018. On Background Immuran, prednisone and  = high risk medication use     HPI Delfin Seif 76 y.o. -returns for follow-up.  Last seen in the summer 2020.  Overall he is doing stable.  He continues with his immunosuppressive medications through rheumatology.  This include Imuran prednisone and Rituxan.  He had his infusion of Rituxan this week.  He feels stable but his pulmonary function test done for this visit shows a decline.  It is documented below his walking desaturation test also is a decline from the past but similar to last visit 6 months or so ago.  His main symptom is chronic cough.  [name] labeled it as a level 3 out of 5 but it appears it might actually be more severe.  [name] asking for palliative relief of cough.  His last liver function test with [place] was in the summer 2020.  It appears per review of the chart in the interim he has seen cardiology Dr. Antoine Poche for his coronary artery  calcification and is on expectant follow-up for that.  No stress test has been recommended because of the lack of chest pain.  His wife who is usually here with him is not here today.  He said she slept in.  [name] on MDI - no emphysema noted in prior CT    ROS - per HPI  OV 02/15/2020  Subjective:  Patient ID: [name], male , DOB: [birth year] , age 1 y.o. , MRN: [id number] , ADDRESS: [address] [place] [address]   02/15/2020 -   Chief Complaint  Patient presents with   Follow-up    Follow-up interstitial lung disease UIP pattern associated with rheumatoid arthritis.   - Rx Rituxan since approx Oct/Nov 2018. On Background Immuran, prednisone and  = high risk medication use  - started ofev mid -  end of march 2021 for progressive disease    HPI Daison Camero 76 y.o. -he has progressive ILD because of rheumatoid arthritis UIP pattern.  Last visit started him on nintedanib.  [name] now here to follow-up for tolerance and monitoring of nintedanib because this requires intensive therapeutic monitoring.  [name] also immunosuppressed.  He tells me that he is now started nintedanib approximately 1 month ago.  [name] tolerating it fine without any nausea vomiting diarrhea.  Last visit he took the consent  form for the ILD-pro registry study.  [name] agreed now to participate in it.  He meets inclusion exclusion criteria because of progressive disease.  At this point in time he is got minimal symptoms.  He desaturated as before.  However he is not compliant with his portable oxygen with exertion.  His cough is improved because of Hycodan.   OV 05/01/2020  Subjective:  Patient ID: Marks Langolf, male , DOB: June 13, 1946 , age 70 y.o. , MRN: [id number] , ADDRESS: [address] [place] [address]   05/01/2020 -   Chief Complaint  Patient presents with   Follow-up    pt is here to to disuss ild    OV 05/30/2020   Subjective:  Patient ID: [name], male , DOB:  06-Feb-1946, age 19 y.o. years. , MRN: [id number],  ADDRESS: [address] [place] [address] PCP  Georgianne Fick, MD Providers : Treatment Team:  Attending Provider: Kalman Shan, MD   Chief Complaint  Patient presents with   Follow-up    Pt states he has been doing good since last visit and states his breathing is about the same.     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   - Rx Rituxan since approx Oct/Nov 2018. On Background Immuran, prednisone and  = high risk medication use  - started ofev mid -  end of march 2021 for progressive disease -> changed to [facility]  June 2021    HPI Dohnovan Traughber 76 y.o. -presents for follow-up.  This time he has brought his wife.  Have not seen the wife in a long time.  She appears to have lost weight.  She says she is dealing with her own health issues of unexplained weight loss.  She says she is abreast of his health issues when he returns home after visits.  However patient continues to be stoic and always states he is feeling well.  Last time he said he was feeling well but had high level of symptoms.  This time also he says he is feeling well and gaining weight and has marked very low level of symptoms.  Therefore overall his history is unreliable.  In the interim he has had his right heart catheterization.  He had nonobstructive coronary artery disease his.  His mean pulmonary artery pressure was elevated at 25.  Wedge pressure could not be elucidated.  His peripheral vascular resistance appears greater than 3.  Therefore he qualifies for nebulized treprostinil.  However he still in the uptake with his pirfenidone.  [name] not applying sunscreen and he says he will.  [name] tolerating pirfenidone well.  He has gained weight no nausea no vomiting no diarrhea.  He uses 4 L of pulsed oxygen at rest.  However at room air at rest he was normal.  He desaturated only after walking 2 laps.    OV  08/28/2020   Subjective:  Patient ID: Lavontae Sather, male , DOB: 1945/12/09, age 80 y.o. years. , MRN: [id number],  ADDRESS: [address] [place] [address] PCP  Georgianne Fick, MD Providers : Treatment Team:  Attending Provider: Kalman Shan, MD Patient Care Team: Georgianne Fick, MD as PCP - General (Internal Medicine) Donnetta Hail, MD as Consulting Physician (Rheumatology) Coral Ceo, NP as Nurse Practitioner (Pulmonary Disease)    Chief Complaint  Patient presents with   Follow-up    coughs when starts eating.      Follow-up interstitial lung disease  UIP pattern associated with rheumatoid arthritis.   - Rx Rituxan since approx Oct/Nov 2018. On Background Immuran, prednisone and  = high risk medication use  - started ofev mid -  end of march 2021 for progressive disease -> changed to [facility]  June 2021 -> stopped sept 2021 due to rash     HPI Koltyn Harju 76 y.o. -returns for follow-up.  At this visit he was supposed to do spirometry and DLCO but he has not.  He presents by himself.  It is unclear why he did not do spirometry and DLCO.  As usual he is stoic and does not give much of a history.  His symptom score appears stable.  His weight appears stable.  He uses 4 L of oxygen.  Between the last 2 visits he made phone calls suggesting initially that the pirfenidone was causing a rash in his upper arm but later the phone call said Spiriva.  We indicated that he should stop the Spiriva.  Today he tells me that he is actually continuing his Spiriva.  He tells me that the rash was caused by the pirfenidone/Esbriet.  Initially did not seem sure it was because of the Esbriet but latter when I repeated questions in multiple different ways he indicated it was desperate causing the rash therefore he stopped it.  He has been 1 month since he stopped it and the rash is disappeared.  The rash was in a nonexposed area in the right upper extremity.  He has had his Covid  vaccine but has not had his booster.  He continues to be on Rituxan which is tolerating well.  I did indicate to him that in the absence of B cells he might not have antibodies and he should get his Covid booster.  We will check Covid IgG as follow-up post booster needs continued intensive monitoring due to high complex status and immunosuppression    OV 12/03/2020  Subjective:  Patient ID: [name], male , DOB: Apr 11, 1946 , age 44 y.o. , MRN: [id number] , ADDRESS: [address] [place] [address] PCP Georgianne Fick, MD Patient Care Team: Georgianne Fick, MD as PCP - General (Internal Medicine) Rollene Rotunda, MD as PCP - Cardiology (Cardiology) Donnetta Hail, MD as Consulting Physician (Rheumatology) Coral Ceo, NP as Nurse Practitioner (Pulmonary Disease)  This Provider for this visit: Treatment Team:  Attending Provider: Kalman Shan, MD    12/03/2020 -   Chief Complaint  Patient presents with   Follow-up    F/U after hospital stay. States he has been feeling well since being home.      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   - Rx Rituxan since approx Oct/Nov 2018. On Background Immuran, prednisone and  = high risk medication use  - started ofev mid -  end of march 2021 for progressive disease -> changed to [facility]  June 2021 -> stopped sept 2021 due to rash  -Participant in ILD-pro registry study   Pulm HTn [facility] 11/18/20 v- Dr Arvilla Meres  Repeat RHC yesterday c/w mild to moderate pulmonary HTN with high cardiac output. PA = 59/18 (34). PCW = 10. PVR = 3.1  - Given PAH in setting of IPF, a trial of inhaled treprostinil seems reasonable    12/03/2020 Peyton Najjar Vogl returns for follow-up.  After my last visit in October 2021 he decompensated in January 2022 and admitted to the hospital intensive care unit needing BiPAP for respiratory failure  in January 2022 last month.  I was in touch with the intensive  care physician Dr. Michae Kava and heart failure specialist Dr. Gala Romney during this time.  They did a right heart catheterization in the results are above.  Patient does have WHO group 3 pulmonary hypertension.  Inhaled treprostinil is indicated.  Dr. Prescott Gum pharmacist [facility] working on this.  I just touch base with Dr. Gala Romney about this.  Patient is here with his wife.  He says he is much improved.  [name] back to his 3 L nasal cannula at baseline.  He reports having had his Covid booster in October 2021.  He had his booster after we found that his Covid IgG was negative.  Of note he is on Rituxan which suppresses IgG production.  He and his wife are interested in future therapeutic options.  [name] a participant in the ILD-pro registry.        OV 02/27/2021  Subjective:  Patient ID: [name], male , DOB: [birth year] , age 41 y.o. , MRN: [id number] , ADDRESS: [address] [place] [address] PCP Georgianne Fick, MD Patient Care Team: Georgianne Fick, MD as PCP - General (Internal Medicine) Rollene Rotunda, MD as PCP - Cardiology (Cardiology) Donnetta Hail, MD as Consulting Physician (Rheumatology) Coral Ceo, NP as Nurse Practitioner (Pulmonary Disease)  This Provider for this visit: Treatment Team:  Attending Provider: Kalman Shan, MD    02/27/2021 -   Chief Complaint  Patient presents with   Follow-up    Doing ok, breathing is stable     HPI Wwilliam Liberman 76 y.o. -returns for follow-up.  He presents with his wife.  He endorses stability.  His symptom score shows worsening but I suspect this is unreliable because he never gives a good history.  He had pulmonary function test that shows improvement compared to the most recent 1 but still worse compared to February 2021.  In the interim he has been started on inhaled treprostinil as of March 2022.  There is some subset analysis evidence to suggest treprostinil can modulte PFTs [and is the question being  raised in the upcoming tETON trial and for IPF] he is on his immunomodulator treatment is finished COVID prophylaxis monoclonal antibody.  [name] able to participate today in the ILD Pro registry visit.      OV 06/30/2021  Subjective:  Patient ID: [name], male , DOB: Nov 05, 1945 , age 26 y.o. , MRN: [id number] , ADDRESS: [address] [place] [address] PCP Georgianne Fick, MD Patient Care Team: Georgianne Fick, MD as PCP - General (Internal Medicine) Rollene Rotunda, MD as PCP - Cardiology (Cardiology) Donnetta Hail, MD as Consulting Physician (Rheumatology) Coral Ceo, NP as Nurse Practitioner (Pulmonary Disease)  This Provider for this visit: Treatment Team:  Attending Provider: Julio Sicks, NP    06/30/2021 -   Chief Complaint  Patient presents with   Follow-up    Pt states coughing.    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   - Rx Rituxan since approx Oct/Nov 2018. On Background Immuran, prednisone and  = high risk medication use  - started ofev mid -  end of march 2021 for progressive disease -> changed to [facility]  June 2021 -> stopped sept 2021 due to rash -> not on anti-foibritc  -Participant in ILD-pro registry study   Pulm HTn [facility] 11/18/20 - Dr Arvilla Meres  Repeat RHC yesterday c/w mild to moderate  pulmonary HTN with high cardiac output. PA = 59/18 (34). PCW = 10. PVR = 3.1  -  started tyvaso early march 2022 - through [facility] - CHF Pharmacist  Associated mild emphysema [previous intolerance to  Spiriva)  - feb 2022 On inceuse  COvid prophylassix (IgG negative Feb 2022)  - tixagevimab/cilgavimab, for pre-exposure prophylaxis  Feb 2022 -= repeat dosing March 2022  HPI Yosiel Clover 76 y.o. -returns for follow-up.  He presents with his wife.  He tells me that overall he is doing stable.  [name] not on antifibrotic's symptom score shows stability.  Pulmonary function test done?  Declining  versus stable.  See below.  [name] using 4 L of oxygen.  Wife states his cough is worse.  He then acknowledged that his cough is worse.  Wife rated as a 5 out of 5.  He says it does not affect his quality of life.  As always there is a discrepancy between his history and her history.  Nevertheless she feels it significant.  The sputum is clear.  There is no wheezing associated with this.  He has tried [employer] and other over-the-counter treatments but these have not helped.  We discussed doing Hycodan opioid cough relief for palliative management of cough in the setting of pulmonary fibrosis.  [name] open to this idea.  [name] taking inhaled Tyvaso for his pulmonary hypertension 4 times a day each time 9 puffs.  He feels it is helping.  At this point he is maxed out on therapy.  [name] doing prednisone, Imuran and Rituxan.  This for his rheumatoid arthritis.  If he declines he can always add CellCept.  [name] a participant in the research registry study.  His 68-month visit is anytime between now and November.  [name] willing to do his research visit today.   OV 11/24/2021  Subjective:  Patient ID: [name], male , DOB: [birth year] , age 57 y.o. , MRN: [id number] , ADDRESS: [address] [place] [address] PCP Georgianne Fick, MD Patient Care Team: Georgianne Fick, MD as PCP - General (Internal Medicine) Rollene Rotunda, MD as PCP - Cardiology (Cardiology) Donnetta Hail, MD as Consulting Physician (Rheumatology) Coral Ceo, NP as Nurse Practitioner (Pulmonary Disease)  This Provider for this visit: Treatment Team:  Attending Provider: Kalman Shan, MD    11/24/2021 -   Chief Complaint  Patient presents with   Follow-up    F/U after Cleda Daub. States he has been stable since last visit. No new symptoms. Still using 4L of O2.     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   - Rx Rituxan since approx Oct/Nov 2018. On Background  Immuran, prednisone and  = high risk medication use  - started ofev mid -  end of march 2021 for progressive disease -> changed to [facility]  June 2021 -> stopped sept 2021 due to rash -> not on anti-foibritc  -Participant in ILD-pro registry study   Pulm HTn RHC 11/18/20 - Dr Arvilla Meres  Repeat RHC yesterday c/w mild to moderate pulmonary HTN with high cardiac output. PA = 59/18 (34). PCW = 10. PVR = 3.1  -  started tyvaso early march 2022 - through Karle Plumber - CHF Pharmacist  Associated mild emphysema [previous intolerance to  Spiriva)  - feb 2022 On inceuse  COvid prophylassix (IgG negative Feb 2022)  - tixagevimab/cilgavimab, for pre-exposure prophylaxis  Feb 2022 - repeat dosing  March 2022  Chronic cough on -Hycodan opioid  HPI Darkiel Seawood 76 y.o. -presents for routine follow-up.  At this point in time he is Rituxan, Imuran and prednisone through the rheumatologist.  [name] on high risk prescription.  [name] not on any antifibrotic's because of side effects.  [name] getting inhaled treprostinil nebulizer through cardiology for his WHO group 3 pulmonary hypertension.  For his associated emphysema is run out of Incruse.  His cough is well controlled but for the last few weeks he is having increased yellow sputum but there is no change in his baseline wheeze which he continues to have intermittently.  Overall he states his symptoms are stable.  However on the symptom score below symptoms have progressed but again I doubt his accuracy and answering these questions.  [name] participant in ILD-Pro registry.  According to the research assistant next visit is only in April 2023.  He had pulmonary function test today and shows continued stability and personal interpretation.  Recently but overlal progression.         OV 02/24/2022  Subjective:  Patient ID: [name], male , DOB: 1946-02-07 , age 65 y.o. , MRN: [id number] , ADDRESS: [address] [place] [address] PCP Georgianne Fick, MD Patient Care Team: Georgianne Fick, MD as PCP - General (Internal Medicine) Rollene Rotunda, MD as PCP - Cardiology (Cardiology) Donnetta Hail, MD as Consulting Physician (Rheumatology) Coral Ceo, NP as Nurse Practitioner (Pulmonary Disease)  This Provider for this visit: Treatment Team:  Attending Provider: Kalman Shan, MD    02/24/2022 -   Chief Complaint  Patient presents with   Follow-up    Pt states he is about the same since last visit. Still becomes SOB with exertion.   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   - Rx Rituxan since approx Oct/Nov 2018.  - On immuran,  -On prednisone and  = high risk medication use  - started ofev mid -  end of march 2021 for progressive disease -> changed to [facility]  June 2021 -> stopped sept 2021 due to rash -> not on anti-foibritc  -Participant in ILD-pro registry study   Pulm HTn RHC 11/18/20 - Dr Reuel Boom Bensimhon  Repeat RHC  c/w mild to moderate pulmonary HTN with high cardiac output. PA = 59/18 (34). PCW = 10. PVR = 3.1  -  started tyvaso early march 2022 - through [facility]le Plumber - CHF Pharmacist  Associated mild emphysema [previous intolerance to  Spiriva)  - feb 2022 On inceuse  COvid prophylassix (IgG negative Feb 2022)  - tixagevimab/cilgavimab, for pre-exposure prophylaxis  Feb 2022 - repeat dosing March 2022  Chronic cough on -Hycodan opioid  HPI Nolberto Barcellos 76 y.o. -returns for his 29-month follow-up.  He says overall he is doing "fair".  He states he is stable.  [name] using 4 L nasal cannula.  His symptom score shows stability.  He continues to use treprostinil but this time is using a DPI.  He says he is tolerating it well except he has cough but the cough is severe although it is quite tolerable and is able to manage.  He would rather deal with the cough then to give up inhaled treprostinil.  He continues immunomodulators of Rituxan which he is due  for next month, Imuran and also prednisone.  [name] continue to lose weight but he says current weight is satisfactory.  [name] a positive ILD-Pro registry  visit but I do not know the schedule for that.  Overall he is stable.     SYMPTOM SCALE - ILD 06/29/2019  01/04/2020  02/15/2020  05/01/2020  05/30/2020  08/28/2020  02/27/2021 On tyvasos 06/30/2021  11/24/2021 tyvaso 02/24/2022   O2 use Uses 3-5L with exertion yuses 3-5L with exertion Non compliant 168# and 4L with exertion - on esbriet 172# on esbriet - 4L at rest  - filled bu him and wife 174# 4L o2 172# 4L O2  169#. 4L 165#  Shortness of Breath 0 -> 5 scale with 5 being worst (score 6 If unable to do)           At rest 3 0 0 4 0 0 2 0 5 1   Simple tasks - showers, clothes change, eating, shaving 2 1 0 5 0 0 3 2 2 3   Household (dishes, doing bed, laundry) 2 1 0 5 0  0 2 2 4   Shopping 2 1 1 5  0 0 0 2 2 1   Walking level at own pace 2 2 2 3  0 0 2 2 2 1   Walking up Stairs 3 3 3 3 2 2 3 3 3 3   Total (40 - 48) Dyspnea Score 14 8 6  2 2 10  16 13   How bad is your cough? 3 3 4 4 4 3 4 11 5 5   How bad is your fatigue 2 1 0 4 0 0 2 5 3 4   nasuea    4 0 0 0 2 2 4   vomit    0 0 0 0 00 0 0  diarrhea  0 0 0 0 0 0 0 0 0  anxiety  0 0 0 0 0 0 00 0 0  deipression  0 0 0 0 0 0 0 0 0  0     Simple office walk 08/01/2018 -  185 feet x  3 laps goal with forehead probe at [facility]  10/06/2018 250 feet x 3 laps 06/29/2019  01/04/2020  02/15/2020  05/30/2020  12/03/2020 Using 3L [place] at home all time 02/27/2021 Uses 4L at home 02/24/2022   O2 used Room air Room air Room air Room air Room air raoom air after turing o2 off fx 5 mn Room air Room air   Number laps completed 3 3 Aim for 3 1 Did only 1 of 3 Did all 3 Did only 2 laps Did only 0.5 lap desats at 0.5 of 3 l;aps   Comments about pace normal normal  avg pace avg opae avg pagce avg pace    Resting Pulse Ox/HR 100% and 80/min 100% and 81/min 94% and 96/min 96% and 101/min 99% and 98/min 96% and 94/min  96% and HR 96 89% RA -> 95 %   Final Pulse Ox/HR 94% and 114/min 91% and 111/min 86% at first lap, HR 131 86% and 118/min 88% and 123/min 83% and 125/,n 85% and HR 124 at half lap 87% at half lap   Desaturated </= 88% no no  yes yes      Desaturated <= 3% points yes yes  yes yes      Got Tachycardic >/= 90/min yes yes  yes yes      Symptoms at end of test x x  Mild dyspnea dyspnea      Miscellaneous comments x x Needed 5L [place] to correct    Corrected with 4L [place] and did noly 2 laps  PFT     Latest Ref Rng & Units 11/24/2021    8:54 AM 06/30/2021    1:20 PM 02/27/2021    9:55 AM 04/08/2020    9:49 AM 12/26/2019    8:54 AM 08/01/2018    9:39 AM 12/20/2017   11:29 AM  PFT Results  FVC-Pre L 1.78   1.76   1.85   1.73   1.96   2.09   2.10    FVC-Predicted Pre % 56   56   58   54   61   69   65    Pre FEV1/FVC % % 85   87   89   90   86   89   88    FEV1-Pre L 1.52   1.53   1.65   1.55   1.69   1.87   1.84    FEV1-Predicted Pre % 66   66   71   66   72   84   77    DLCO uncorrected ml/min/mmHg 6.59   7.62   10.01   8.10   13.14   13.24   12.30    DLCO UNC% % 29   34   45   36   58   52   45    DLCO corrected ml/min/mmHg 6.59   7.62   10.01   9.15       DLCO COR %Predicted % 29   34   45   41       DLVA Predicted % 46   55   67   68   88   95   80         has a past medical history of Arthritis, COPD (chronic obstructive pulmonary disease) (HCC), Diabetes mellitus, Hypertension, Pneumonia, and Thyroid disease.   reports that he quit smoking about 37 years ago. His smoking use included cigarettes. He has a 5.00 pack-year smoking history. He has never used smokeless tobacco.  Past Surgical History:  Procedure Laterality Date   KNEE ARTHROSCOPY     NO PAST SURGERIES     RIGHT HEART CATH N/A 11/18/2020   Procedure: RIGHT HEART CATH;  Surgeon: Dolores Patty, MD;  Location: [facility] INVASIVE CV LAB;  Service: Cardiovascular;  Laterality: N/A;   RIGHT/LEFT HEART CATH AND CORONARY ANGIOGRAPHY  N/A 05/21/2020   Procedure: RIGHT/LEFT HEART CATH AND CORONARY ANGIOGRAPHY;  Surgeon: [place], Peter M, MD;  Location: [facility] INVASIVE CV LAB;  Service: Cardiovascular;  Laterality: N/A;    Allergies  Allergen Reactions   Bactrim [Sulfamethoxazole-Trimethoprim] Shortness Of Breath    Patient states "got short winded"   Spiriva Respimat [Tiotropium Bromide Monohydrate] Rash   Ofev [Nintedanib]     Immunization History  Administered Date(s) Administered   Fluad Quad(high Dose 65+) 08/12/2020   Influenza Split 08/03/2011, 07/03/2012, 09/02/2013, 07/03/2014   Influenza, High Dose Seasonal PF 07/30/2014, 07/15/2016, 08/02/2016, 08/02/2017, 07/25/2018, 07/04/2019   Influenza, Quadrivalent, Recombinant, Inj, Pf 08/11/2017, 07/05/2018, 07/11/2019, 08/15/2020   Influenza,inj,Quad PF,6+ Mos 07/29/2015   Influenza-Unspecified 07/23/2012   PFIZER(Purple Top)SARS-COV-2 Vaccination 01/01/2020, 01/13/2020, 02/05/2020, 08/31/2020   Pneumococcal Conjugate-13 08/20/2015, 08/01/2018   Pneumococcal Polysaccharide-23 11/03/2007, 12/04/2007, 01/12/2018   Zoster Recombinat (Shingrix) 04/11/2019    Family History  Problem Relation Age of Onset   Diabetes Mother    Stroke Mother      Current Outpatient Medications:    acetaminophen (TYLENOL) 500 MG tablet, Take 1,000 mg by mouth every  6 (six) hours as needed for moderate pain or headache., Disp: , Rfl:    albuterol (VENTOLIN HFA) 108 (90 Base) MCG/ACT inhaler, Inhale 2 puffs into the lungs every 6 (six) hours as needed., Disp: , Rfl:    amLODipine (NORVASC) 10 MG tablet, Take 10 mg by mouth daily., Disp: , Rfl:    aspirin 81 MG tablet, Take 81 mg by mouth daily., Disp: , Rfl:    atorvastatin (LIPITOR) 20 MG tablet, Take 20 mg by mouth daily., Disp: , Rfl: 2   azaTHIOprine (IMURAN) 50 MG tablet, Take 100 mg by mouth daily. , Disp: , Rfl:    doxycycline (VIBRA-TABS) 100 MG tablet, Take 1 tablet (100 mg total) by mouth 2 (two) times daily., Disp: 10 tablet,  Rfl: 0   fluticasone (FLONASE) 50 MCG/ACT nasal spray, Place 2 sprays into both nostrils daily., Disp: 16 g, Rfl: 2   folic acid (FOLVITE) 400 MCG tablet, Take 400 mcg by mouth daily., Disp: , Rfl:    HYDROcodone bit-homatropine (HYCODAN) 5-1.5 MG/5ML syrup, Take 5 mLs by mouth 2 (two) times daily as needed for cough., Disp: 240 mL, Rfl: 0   insulin NPH Human (NOVOLIN N) 100 UNIT/ML injection, Inject 12 Units into the skin See admin instructions. Inject 10 units every morning and 20 units every evening, Disp: , Rfl:    Insulin Syringe-Needle U-100 (INSULIN SYRINGE .3CC/31GX5/16") 31G X 5/16" 0.3 ML MISC, Inject 1 Syringe into the skin as directed., Disp: , Rfl:    levothyroxine (SYNTHROID, LEVOTHROID) 25 MCG tablet, Take 25 mcg by mouth daily., Disp: , Rfl:    metFORMIN (GLUCOPHAGE-XR) 500 MG 24 hr tablet, Take 2 tablets (1,000 mg total) by mouth 2 (two) times daily., Disp: , Rfl:    pantoprazole (PROTONIX) 40 MG tablet, Take 40 mg by mouth daily., Disp: , Rfl:    predniSONE (DELTASONE) 1 MG tablet, Take 3 mg by mouth daily., Disp: , Rfl:    Treprostinil (TYVASO DPI MAINTENANCE KIT) 48 MCG POWD, Inhale 48 mcg into the lungs in the morning, at noon, in the evening, and at bedtime., Disp: , Rfl:    umeclidinium bromide (INCRUSE ELLIPTA) 62.5 MCG/ACT AEPB, Inhale 1 puff into the lungs daily., Disp: 30 each, Rfl: 5      Objective:   Vitals:   02/24/22 0956  BP: (!) 126/58  Pulse: 90  Temp: 97.8 F (36.6 C)  TempSrc: Oral  SpO2: 93%  Weight: 165 lb 3.2 oz (74.9 kg)  Height: 5\' 6"  (1.676 m)    Estimated body mass index is 26.66 kg/m as calculated from the following:   Height as of this encounter: 5\' 6"  (1.676 m).   Weight as of this encounter: 165 lb 3.2 oz (74.9 kg).  @WEIGHTCHANGE @  [employer]   02/24/22 0956  Weight: 165 lb 3.2 oz (74.9 kg)     Physical Exam    General: No distress. Looks well Neuro: Alert and Oriented x 3. GCS 15. Speech normal Psych: Pleasant Resp:   Barrel Chest - no.  Wheeze - no, Crackles - YESS, No overt respiratory distress CVS: Normal heart sounds. Murmurs - no Ext: Stigmata of Connective Tissue Disease - no HEENT: Normal upper airway. PEERL +. No post nasal drip        Assessment:       ICD-10-CM   1. Interstitial lung disease due to connective tissue disease (HCC)  J84.89 Pulmonary function test   M35.9     2. Chronic respiratory failure with  hypoxia (HCC)  J96.11 Pulmonary function test    3. Chronic cough  R05.3          Plan:     Patient Instructions  Interstitial lung disease due to connective tissue disease (HCC) Chronic respiratory failure with hypoxia (HCC) Immunosuppressed and on high risk medication  -Glad you are stable.  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 Marchelle Gearing for standard of care visit in 30-minute slot in 3-4 months but after PFT  = ild symptom score and walk test at  followup    SIGNATURE    Dr. Kalman Shan, [name], [name],  Pulmonary and Critical Care Medicine Staff Physician, [facility] Director - Interstitial Lung Disease  Program  Pulmonary Fibrosis [facility] at [facility] [place], [place], [address]  Pager: [phone], If no answer or between  15:00h - 7:00h: call [phone] Telephone: [phone]  10:29 AM 02/24/2022

## 2022-03-10 DIAGNOSIS — M0589 Other rheumatoid arthritis with rheumatoid factor of multiple sites: Secondary | ICD-10-CM | POA: Diagnosis not present

## 2022-04-02 DIAGNOSIS — M0589 Other rheumatoid arthritis with rheumatoid factor of multiple sites: Secondary | ICD-10-CM | POA: Diagnosis not present

## 2022-04-13 DIAGNOSIS — E559 Vitamin D deficiency, unspecified: Secondary | ICD-10-CM | POA: Diagnosis not present

## 2022-04-13 DIAGNOSIS — M0589 Other rheumatoid arthritis with rheumatoid factor of multiple sites: Secondary | ICD-10-CM | POA: Diagnosis not present

## 2022-04-13 DIAGNOSIS — J849 Interstitial pulmonary disease, unspecified: Secondary | ICD-10-CM | POA: Diagnosis not present

## 2022-04-13 DIAGNOSIS — D72819 Decreased white blood cell count, unspecified: Secondary | ICD-10-CM | POA: Diagnosis not present

## 2022-04-13 DIAGNOSIS — Z13820 Encounter for screening for osteoporosis: Secondary | ICD-10-CM | POA: Diagnosis not present

## 2022-04-13 DIAGNOSIS — M150 Primary generalized (osteo)arthritis: Secondary | ICD-10-CM | POA: Diagnosis not present

## 2022-04-13 DIAGNOSIS — E109 Type 1 diabetes mellitus without complications: Secondary | ICD-10-CM | POA: Diagnosis not present

## 2022-04-13 DIAGNOSIS — Z79899 Other long term (current) drug therapy: Secondary | ICD-10-CM | POA: Diagnosis not present

## 2022-04-13 DIAGNOSIS — J9611 Chronic respiratory failure with hypoxia: Secondary | ICD-10-CM | POA: Diagnosis not present

## 2022-04-22 ENCOUNTER — Telehealth: Payer: Self-pay | Admitting: Internal Medicine

## 2022-04-23 NOTE — Telephone Encounter (Signed)
Called and spoke with patient who states that in the last 3-4 weeks he has lost about 10 lbs. States he weighed 165 and now weighs 157. Patient also states that he has not been able to do a lot of walking like he used to recently due to shortness of breath. Advised him I would send message to Dr. Marchelle Gearing to let him know.   Dr. Marchelle Gearing please advise

## 2022-04-23 NOTE — Telephone Encounter (Signed)
Called patient and left voicemail for him to call office back in regards to his weight loss.

## 2022-04-24 NOTE — Telephone Encounter (Signed)
Could be from antifibrotic also could be from disease state  Plan - Keep appointment with nurse practitioner next week.  Might be that we might have to do medication holiday

## 2022-04-24 NOTE — Telephone Encounter (Signed)
I called and spoke with the pt and notified of response per MR  He verbalized understanding  [name] not scheduled next wk  He prefers to see TP  I scheduled him next opening with her for 05/13/22 and he is aware to call sooner if needed

## 2022-05-13 ENCOUNTER — Telehealth: Payer: Self-pay | Admitting: Adult Health

## 2022-05-13 ENCOUNTER — Ambulatory Visit (INDEPENDENT_AMBULATORY_CARE_PROVIDER_SITE_OTHER): Payer: Medicare Other

## 2022-05-13 ENCOUNTER — Ambulatory Visit (INDEPENDENT_AMBULATORY_CARE_PROVIDER_SITE_OTHER): Payer: Medicare Other | Admitting: Adult Health

## 2022-05-13 ENCOUNTER — Encounter: Payer: Self-pay | Admitting: Adult Health

## 2022-05-13 VITALS — BP 124/60 | HR 96 | Temp 98.1°F | Ht 66.0 in | Wt 157.0 lb

## 2022-05-13 DIAGNOSIS — J9611 Chronic respiratory failure with hypoxia: Secondary | ICD-10-CM

## 2022-05-13 DIAGNOSIS — K029 Dental caries, unspecified: Secondary | ICD-10-CM

## 2022-05-13 DIAGNOSIS — J8489 Other specified interstitial pulmonary diseases: Secondary | ICD-10-CM

## 2022-05-13 DIAGNOSIS — E039 Hypothyroidism, unspecified: Secondary | ICD-10-CM

## 2022-05-13 DIAGNOSIS — M359 Systemic involvement of connective tissue, unspecified: Secondary | ICD-10-CM

## 2022-05-13 DIAGNOSIS — M069 Rheumatoid arthritis, unspecified: Secondary | ICD-10-CM | POA: Diagnosis not present

## 2022-05-13 DIAGNOSIS — R634 Abnormal weight loss: Secondary | ICD-10-CM

## 2022-05-13 DIAGNOSIS — J8410 Pulmonary fibrosis, unspecified: Secondary | ICD-10-CM | POA: Diagnosis not present

## 2022-05-13 DIAGNOSIS — R059 Cough, unspecified: Secondary | ICD-10-CM | POA: Diagnosis not present

## 2022-05-13 DIAGNOSIS — D849 Immunodeficiency, unspecified: Secondary | ICD-10-CM

## 2022-05-13 LAB — COMPREHENSIVE METABOLIC PANEL
ALT: 8 U/L (ref 0–53)
AST: 15 U/L (ref 0–37)
Albumin: 3.8 g/dL (ref 3.5–5.2)
Alkaline Phosphatase: 40 U/L (ref 39–117)
BUN: 10 mg/dL (ref 6–23)
CO2: 31 mEq/L (ref 19–32)
Calcium: 8.8 mg/dL (ref 8.4–10.5)
Chloride: 104 mEq/L (ref 96–112)
Creatinine, Ser: 0.85 mg/dL (ref 0.40–1.50)
GFR: 84.07 mL/min (ref >60.00)
Glucose, Bld: 89 mg/dL (ref 70–99)
Potassium: 3.9 mEq/L (ref 3.5–5.1)
Sodium: 140 mEq/L (ref 135–145)
Total Bilirubin: 0.4 mg/dL (ref 0.2–1.2)
Total Protein: 6.8 g/dL (ref 6.0–8.3)

## 2022-05-13 LAB — TSH: TSH: 1.8 u[IU]/mL (ref 0.35–5.50)

## 2022-05-13 LAB — CBC WITH DIFFERENTIAL/PLATELET
Basophils Absolute: 0 10*3/uL (ref 0.0–0.1)
Basophils Relative: 0.7 % (ref 0.0–3.0)
Eosinophils Absolute: 0.2 10*3/uL (ref 0.0–0.7)
Eosinophils Relative: 4.7 % (ref 0.0–5.0)
HCT: 31.8 % — ABNORMAL LOW (ref 39.0–52.0)
Hemoglobin: 9.9 g/dL — ABNORMAL LOW (ref 13.0–17.0)
Lymphocytes Relative: 9.4 % — ABNORMAL LOW (ref 12.0–46.0)
Lymphs Abs: 0.5 10*3/uL — ABNORMAL LOW (ref 0.7–4.0)
MCHC: 31.1 g/dL (ref 30.0–36.0)
MCV: 94.2 fl (ref 78.0–100.0)
Monocytes Absolute: 0.5 10*3/uL (ref 0.1–1.0)
Monocytes Relative: 9.3 % (ref 3.0–12.0)
Neutro Abs: 3.9 10*3/uL (ref 1.4–7.7)
Neutrophils Relative %: 75.9 % (ref 43.0–77.0)
Platelets: 252 10*3/uL (ref 150.0–400.0)
RBC: 3.38 Mil/uL — ABNORMAL LOW (ref 4.22–5.81)
RDW: 19.7 % — ABNORMAL HIGH (ref 11.5–15.5)
WBC: 5.1 10*3/uL (ref 4.0–10.5)

## 2022-05-13 MED ORDER — AZITHROMYCIN 250 MG PO TABS: ORAL_TABLET | ORAL | 0 refills | Status: AC

## 2022-05-13 MED ORDER — PREDNISONE 5 MG PO TABS: ORAL_TABLET | ORAL | 0 refills | Status: DC

## 2022-05-13 NOTE — Addendum Note (Signed)
Addended by: Vanessa Barbara on: 05/13/2022 11:06 AM   Modules accepted: Orders

## 2022-05-13 NOTE — Assessment & Plan Note (Addendum)
No increased oxygen demands.  However continues to have progressive shortness of breath with decreased activity tolerance and endurance.  Continue on O2 to maintain O2 saturations greater than 88 to 90%.  Chest x-ray and high-resolution CT chest are pending

## 2022-05-13 NOTE — Assessment & Plan Note (Signed)
Continue on current immunosuppressive regimen.  We will have a short prednisone burst for possible ILD flare.  Lab work today.  Continue follow-up with rheumatology

## 2022-05-13 NOTE — Assessment & Plan Note (Signed)
I encourage patient to follow-up with a dentist to evaluate for lower dental caries and broken teeth this may also be affecting his appetite and eating habits

## 2022-05-13 NOTE — Assessment & Plan Note (Signed)
Patient has ongoing weight loss.  Check TSH.

## 2022-05-13 NOTE — Assessment & Plan Note (Signed)
Ongoing weight loss with decreased appetite.  Is encouraged to start eating smaller more frequent meals.  Add an Ensure.  High-protein diet.  We will check labs including CBC c-Met and TSH.  Patient is advised to follow-up with his primary care provider  High-resolution CT chest is pending

## 2022-05-13 NOTE — Assessment & Plan Note (Signed)
Rheumatoid arthritis associated Advil.  Patient with possible flare versus progressive disease We will treat with empiric antibiotics for possible superimposed bronchitis.  Short prednisone burst. Check chest x-ray today.  Also needs a high-resolution CT chest. Check lab work today. Continue on current regimen.   Plan  Patient Instructions  Chest xray today . Set up for HRCT chest  Labs today .  Increase Prednisone 50m daily for 3 days , then 163mdaily for 3 days then 1036maily for 3 days , then back 5mg59mily for 3 days , then 3mg 71mly .  Continue on Tyvaso  Monitor blood sugars call if >250.  Zpack take as directed  Try to eat every 2 hrs small amounts , "grazing" , higher protein foods.  Can try ensure between meals.  Continue on oxygen 4 L [place] as tolerated Follow up with dentist.  Follow-up in 1 month as planned with PFT Dr. RamasChase CallerAs needed   Please contact office for sooner follow up if symptoms do not improve or worsen or seek emergency care

## 2022-05-13 NOTE — Patient Instructions (Addendum)
Chest xray today . Set up for HRCT chest  Labs today .  Increase Prednisone 19m daily for 3 days , then 128mdaily for 3 days then 1021maily for 3 days , then back 5mg27mily for 3 days , then 3mg 26mly .  Continue on Tyvaso  Monitor blood sugars call if >250.  Zpack take as directed  Try to eat every 2 hrs small amounts , "grazing" , higher protein foods.  Can try ensure between meals.  Continue on oxygen 4 L [place] as tolerated Follow up with dentist.  Follow-up in 1 month as planned with PFT Dr. RamasChase CallerAs needed   Please contact office for sooner follow up if symptoms do not improve or worsen or seek emergency care

## 2022-05-13 NOTE — Telephone Encounter (Signed)
[place] in [place], gave clarification needed on Prednisone script.  Patient is on a maintenance dose of 3 mg daily.  He has this dose at home already.  Nothing further needed.

## 2022-05-13 NOTE — Progress Notes (Signed)
_0  ID: [name], male    DOB: 11/14/1945, 76 y.o.   MRN: [id number]  Chief Complaint  Patient presents with   Follow-up    Referring provider: Merrilee Seashore, MD  HPI: 76 year old male former smoker followed for rheumatoid arthritis associated interstitial lung disease (UIP pattern) and chronic respiratory failure on oxygen.  Has ILD related pulmonary hypertension (pulmonary hypertension on right heart cath January 2022 by Dr. Tempie Hoist)  Followed by rheumatology on Imuran and prednisone. Intolerant to antifibrotic's (significant side effects)  TEST/EVENTS :  ILD treatment Hx :  Rx Rituxan since approx Oct/Nov 2018. On Background Immuran, prednisone and  = high risk medication use             - started ofev mid -  end of march 2021 for progressive disease- stopped Mid 2021 due to GI SE  - started on Esbriet 04/2020 -07/2020 ) stopped due to rash  -Participant in ILD -Pro Registry study  -Started on Tyvaso early March 2022   05/13/2022 Acute OV : Weight loss, RA-ILD  Patient presents for a work in visit.  Patient complains that over the last 3 months his weight continues to go down he is down another 10 pounds.  He has increased fatigue decreased activity tolerance and shortness of breath. Complains of increased cough, congestion with thick yellow mucus . No body aches or fever. No increased joint pain . No joint swelling or redness . Low appetite, no desire to eat. No abdominal pain, n/v/d. No dysuria . No hemoptysis.  Patient is followed for rheumatoid arthritis associated interstitial lung disease with UIP pattern.  He has chronic respiratory failure on home oxygen.  [name] followed by rheumatology is on Imuran and low-dose prednisone.  Prednisone dose is at 3 mg daily.  Patient is had no increased oxygen demands.  Remains on 4 L of oxygen.  Patient has been intolerant to antifibrotic's in the past.  He has ILD associated pulmonary hypertension.  [name] on Tyvaso. Says doing  well  on it without headache or dizziness.  No recent labs or xray. Last CT chest November 15, 2020 shows trace left pleural effusion extensive patchy groundglass opacity and consolidation throughout both lungs superimposed on chronic patchy reticulation and moderate traction bronchiectasis with extensive honeycombing and architectural distortion findings suggestive of a multi lobar pneumonia superimposed on previously documented UIP.    Allergies  Allergen Reactions   Bactrim [Sulfamethoxazole-Trimethoprim] Shortness Of Breath    Patient states "got short winded"   Spiriva Respimat [Tiotropium Bromide Monohydrate] Rash   Ofev [Nintedanib]     Immunization History  Administered Date(s) Administered   Fluad Quad(high Dose 65+) 08/12/2020   Influenza Split 08/03/2011, 07/03/2012, 09/02/2013, 07/03/2014   Influenza, High Dose Seasonal PF 07/30/2014, 07/15/2016, 08/02/2016, 08/02/2017, 07/25/2018, 07/04/2019   Influenza, Quadrivalent, Recombinant, Inj, Pf 08/11/2017, 07/05/2018, 07/11/2019, 08/15/2020, 07/28/2021   Influenza,inj,Quad PF,6+ Mos 07/29/2015   Influenza-Unspecified 07/23/2012   Moderna Sars-Covid-2 Vaccination 08/01/2021   PFIZER(Purple Top)SARS-COV-2 Vaccination 01/01/2020, 01/13/2020, 02/05/2020, 08/31/2020   Pneumococcal Conjugate-13 08/20/2015, 08/01/2018   Pneumococcal Polysaccharide-23 11/03/2007, 12/04/2007, 01/12/2018   Zoster Recombinat (Shingrix) 04/11/2019    Past Medical History:  Diagnosis Date   Arthritis    COPD (chronic obstructive pulmonary disease) ([place])    Diabetes mellitus    Hypertension    Pneumonia    Thyroid disease     Tobacco History: Social History   Tobacco Use  Smoking Status Former   Packs/day: 0.50   Years: 10.00  Total pack years: 5.00   Types: Cigarettes   Quit date: 12/03/1984   Years since quitting: 37.4  Smokeless Tobacco Never  Tobacco Comments   Quit 40 years ago.    Counseling given: Not Answered Tobacco comments:  Quit 40 years ago.    Outpatient Medications Prior to Visit  Medication Sig Dispense Refill   acetaminophen (TYLENOL) 500 MG tablet Take 1,000 mg by mouth every 6 (six) hours as needed for moderate pain or headache.     albuterol (VENTOLIN HFA) 108 (90 Base) MCG/ACT inhaler Inhale 2 puffs into the lungs every 6 (six) hours as needed.     amLODipine (NORVASC) 10 MG tablet Take 10 mg by mouth daily.     aspirin 81 MG tablet Take 81 mg by mouth daily.     atorvastatin (LIPITOR) 20 MG tablet Take 20 mg by mouth daily.  2   azaTHIOprine (IMURAN) 50 MG tablet Take 100 mg by mouth daily.      doxycycline (VIBRA-TABS) 100 MG tablet Take 1 tablet (100 mg total) by mouth 2 (two) times daily. 10 tablet 0   fluticasone (FLONASE) 50 MCG/ACT nasal spray Place 2 sprays into both nostrils daily. 16 g 2   folic acid (FOLVITE) 027 MCG tablet Take 400 mcg by mouth daily.     HYDROcodone bit-homatropine (HYCODAN) 5-1.5 MG/5ML syrup Take 5 mLs by mouth 2 (two) times daily as needed for cough. 240 mL 0   insulin NPH Human (NOVOLIN N) 100 UNIT/ML injection Inject 12 Units into the skin See admin instructions. Inject 10 units every morning and 20 units every evening     Insulin Syringe-Needle U-100 (INSULIN SYRINGE .3CC/31GX5/16") 31G X 5/16" 0.3 ML MISC Inject 1 Syringe into the skin as directed.     levothyroxine (SYNTHROID, LEVOTHROID) 25 MCG tablet Take 25 mcg by mouth daily.     metFORMIN (GLUCOPHAGE-XR) 500 MG 24 hr tablet Take 2 tablets (1,000 mg total) by mouth 2 (two) times daily.     pantoprazole (PROTONIX) 40 MG tablet Take 40 mg by mouth daily.     predniSONE (DELTASONE) 1 MG tablet Take 3 mg by mouth daily.     Treprostinil (TYVASO DPI MAINTENANCE KIT) 48 MCG POWD Inhale 48 mcg into the lungs in the morning, at noon, in the evening, and at bedtime.     umeclidinium bromide (INCRUSE ELLIPTA) 62.5 MCG/ACT AEPB Inhale 1 puff into the lungs daily. 30 each 5   No facility-administered medications prior to  visit.     Review of Systems:   Constitutional:   ++weight loss,No   Fevers, chills,  +fatigue, or  lassitude.  HEENT:   No headaches,  Difficulty swallowing,  Tooth/dental problems, or  Sore throat,                No sneezing, itching, ear ache, nasal congestion, post nasal drip,   CV:  No chest pain,  Orthopnea, PND, swelling in lower extremities, anasarca, dizziness, palpitations, syncope.   GI  No heartburn, indigestion, abdominal pain, nausea, vomiting, diarrhea, change in bowel habits, loss of appetite, bloody stools.   Resp: .  No chest wall deformity  Skin: no rash or lesions.  GU: no dysuria, change in color of urine, no urgency or frequency.  No flank pain, no hematuria   MS:  No joint pain or swelling.  No decreased range of motion.  No back pain.    Physical Exam  BP 124/60 (BP Location: Left Arm, Patient Position: Sitting,  Cuff Size: Normal)   Pulse 96   Temp 98.1 F (36.7 C) (Temporal)   Ht _0  (1.676 m)   Wt 157 lb (71.2 kg)   SpO2 96%   BMI 25.34 kg/m   GEN: A/Ox3; pleasant , NAD, elderly on O2    HEENT:  [place]/AT,  EACs-clear, TMs-wnl, NOSE-clear, THROAT-clear, no lesions, no postnasal drip or exudate noted. Poor dentition, broken teeth/caries -lower   NECK:  Supple w/ fair ROM; no JVD; normal carotid impulses w/o bruits; no thyromegaly or nodules palpated; no lymphadenopathy.    RESP  BB crackles,  no accessory muscle use, no dullness to percussion  CARD:  RRR, no m/r/g, no peripheral edema, pulses intact, no cyanosis or clubbing.  GI:   Soft & nt; nml bowel sounds; no organomegaly or masses detected.   Musco: Warm bil, no deformities or joint swelling noted.   Neuro: alert, no focal deficits noted.    Skin: Warm, no lesions or rashes    Lab Results:    BMET    Imaging: DG Chest 2 View  Result Date: 05/13/2022 CLINICAL DATA:  Provided history: Weight loss for 3 months, increased cough, I LD. EXAM: CHEST - 2 VIEW COMPARISON:  CT  angiogram chest 11/15/2020. Prior chest radiographs 11/14/2020 and earlier. FINDINGS: Heart size within normal limits. Chronic interstitial lung disease, right greater than left, similar in appearance to the prior chest radiograph 11/14/2020. No definite superimposed acute airspace consolidation. No evidence of pleural effusion or pneumothorax. No acute bony abnormality identified. Degenerative changes of the spine. IMPRESSION: Chronic interstitial lung disease, right greater than left, similar to the prior chest radiograph of 11/14/2020. Please refer to the prior chest CT of 11/15/2020 for further description. No appreciable superimposed acute airspace consolidation. Electronically Signed   By: Kellie Simmering D.O.   On: 05/13/2022 10:00         Latest Ref Rng & Units 11/24/2021    8:54 AM 06/30/2021    1:20 PM 02/27/2021    9:55 AM 04/08/2020    9:49 AM 12/26/2019    8:54 AM 08/01/2018    9:39 AM 12/20/2017   11:29 AM  PFT Results  FVC-Pre L 1.78  1.76  1.85  1.73  1.96  2.09  2.10   FVC-Predicted Pre % 56  56  58  54  61  69  65   Pre FEV1/FVC % % 85  87  89  90  86  89  88   FEV1-Pre L 1.52  1.53  1.65  1.55  1.69  1.87  1.84   FEV1-Predicted Pre % 66  66  71  66  72  84  77   DLCO uncorrected ml/min/mmHg 6.59  7.62  10.01  8.10  13.14  13.24  12.30   DLCO UNC% % 29  34  45  36  58  52  45   DLCO corrected ml/min/mmHg 6.59  7.62  10.01  9.15      DLCO COR %Predicted % 29  34  45  41      DLVA Predicted % 46  55  67  68  88  95  80     No results found for: "NITRICOXIDE"      Assessment & Plan:   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) Rheumatoid arthritis associated Advil.  Patient with possible flare versus progressive disease We will treat with empiric antibiotics for possible superimposed bronchitis.  Short prednisone  burst. Check chest x-ray today.  Also needs a high-resolution CT chest. Check lab work today. Continue on current regimen.   Plan  Patient Instructions   Chest xray today . Set up for HRCT chest  Labs today .  Increase Prednisone 37m daily for 3 days , then 156mdaily for 3 days then 1042maily for 3 days , then back 5mg56mily for 3 days , then 3mg 63mly .  Continue on Tyvaso  Monitor blood sugars call if >250.  Zpack take as directed  Try to eat every 2 hrs small amounts , "grazing" , higher protein foods.  Can try ensure between meals.  Continue on oxygen 4 L Activity as tolerated Follow up with dentist.  Follow-up in 1 month as planned with PFT Dr. RamasChase CallerAs needed   Please contact office for sooner follow up if symptoms do not improve or worsen or seek emergency care       Rheumatoid arthritis (HCC) [place] on current immunosuppressive regimen.  We will have a short prednisone burst for possible ILD flare.  Lab work today.  Continue follow-up with rheumatology  Hypothyroidism Patient has ongoing weight loss.  Check TSH.  Chronic respiratory failure with hypoxia (HCC) No increased oxygen demands.  However continues to have progressive shortness of breath with decreased activity tolerance and endurance.  Continue on O2 to maintain O2 saturations greater than 88 to 90%.  Chest x-ray and high-resolution CT chest are pending  Weight loss Ongoing weight loss with decreased appetite.  Is encouraged to start eating smaller more frequent meals.  Add an Ensure.  High-protein diet.  We will check labs including CBC c-Met and TSH.  Patient is advised to follow-up with his primary care provider  High-resolution CT chest is pending  Dental caries I encourage patient to follow-up with a dentist to evaluate for lower dental caries and broken teeth this may also be affecting his appetite and eating habits     Marchelle Rinella, NP 05/13/2022

## 2022-05-15 NOTE — Progress Notes (Signed)
Called and spoke with patient, advised him of results/recommendations per Rexene Edison NP.  He verbalized understanding.  Nothing further needed.

## 2022-05-25 ENCOUNTER — Encounter: Payer: Medicare Other | Admitting: Internal Medicine

## 2022-05-25 DIAGNOSIS — Z006 Encounter for examination for normal comparison and control in clinical research program: Secondary | ICD-10-CM

## 2022-05-25 DIAGNOSIS — J8489 Other specified interstitial pulmonary diseases: Secondary | ICD-10-CM

## 2022-05-25 NOTE — Research (Signed)
Title: Chronic Fibrosing Interstitial Lung Disease with Progressive Phenotype Prospective Outcomes (ILD-PRO) Registry    Protocol #: IPF-PRO-SUB, Clinical Trials # [id number], Sponsor: [employer]   Protocol Version Amendment 4 dated 12Sep2019  and confirmed current on  Consent Version for today's visit date of  Is [place] IRB Approved Version 06 Oct 2018 Revised 06 Oct 2018   Objectives:  Describe current approaches to diagnosis and treatment of chronic fibrosing ILDs with progressive phenotype  Describe the natural history of chronic fibrosing ILDs with progressive phenotype  Assess quality of life from self-administered participant reported questionnaires for each disease group  Describe participant interactions with the healthcare system, describe treatment practices across multiple institutions for each disease group  Collect biological samples linked to well characterized chronic fibrosing ILDs with progressive phenotype to identify disease biomarkers  Collect data and biological samples that will support future research studies.                                            Key Inclusion Criteria: Willing and able to provide informed consent  Age ? 30 years  Diagnosis of a non-IPF ILD of any duration, including, but not limited to Idiopathic Non-Specific Interstitial Pneumonia (INSIP), Unclassifiable Idiopathic Interstitial Pneumonias (IIPs), Interstitial Pneumonia with Autoimmune Features (IPAF), Autoimmune ILDs such as Rheumatoid Arthritis (RA-ILD) and Systemic Sclerosis (SSC-ILD), Chronic Hypersensitivity Pneumonitis (HP), Sarcoidosis or Exposure-related ILDs such as asbestosis.  Chronic fibrosing ILD defined by reticular abnormality with traction bronchiectasis with or without honeycombing confirmed by chest HRCT scan and/or lung biopsy.  Progressive phenotype as defined by fulfilling at least one of the criteria below of fibrotic changes (progression set point)  within the last 24 months regardless of treatment considered appropriate in individual ILDs:  decline in FVC % predicted (% pred) based on >10% relative decline  decline in FVC % pred based on ? 5 - <10% relative decline in FVC combined with worsening of respiratory symptoms as assessed by the site investigator  decline in FVC % pred based on ? 5 - <10% relative decline in FVC combined with increasing extent of fibrotic changes on chest imaging (HRCT scan) as assessed by the site investigator  decline in DLCO % pred based on ? 10% relative decline  worsening of respiratory symptoms as well as increasing extent of fibrotic changes on chest imaging (HRCT scan) as assessed by the site investigator independent of FVC change.     Key Exclusion Criteria: Malignancy, treated or untreated, other than skin or early stage prostate cancer, within the past 5 years  Currently listed for lung transplantation at the time of enrollment  Currently enrolled in a clinical trial at the time of enrollment in this registry       Clinical Research Coordinator / Research RN note : This visit for Bison [id number] with [id number] on [phone] for the above protocol is Visit/Encounter 4 and is for purpose of research.    Subject expressed continued interest and consent in continuing as a study subject. Subject confirmed that there was no change in contact information (e.g. address, telephone, email). Subject thanked for participation in research and contribution to science.     During this visit on 24Jul2023 , the subject completed the blood work and questionnaires per the above referenced protocol. Please refer to the subject's paper source binder for further details.   Signed by  [employer], [place] 11:29am [phone]

## 2022-06-02 DIAGNOSIS — I700 Atherosclerosis of aorta: Secondary | ICD-10-CM | POA: Diagnosis not present

## 2022-06-02 DIAGNOSIS — I10 Essential (primary) hypertension: Secondary | ICD-10-CM | POA: Diagnosis not present

## 2022-06-02 DIAGNOSIS — E782 Mixed hyperlipidemia: Secondary | ICD-10-CM | POA: Diagnosis not present

## 2022-06-02 DIAGNOSIS — J849 Interstitial pulmonary disease, unspecified: Secondary | ICD-10-CM | POA: Diagnosis not present

## 2022-06-02 DIAGNOSIS — R5383 Other fatigue: Secondary | ICD-10-CM | POA: Diagnosis not present

## 2022-06-02 DIAGNOSIS — E1022 Type 1 diabetes mellitus with diabetic chronic kidney disease: Secondary | ICD-10-CM | POA: Diagnosis not present

## 2022-06-02 DIAGNOSIS — J9611 Chronic respiratory failure with hypoxia: Secondary | ICD-10-CM | POA: Diagnosis not present

## 2022-06-02 DIAGNOSIS — R0902 Hypoxemia: Secondary | ICD-10-CM | POA: Diagnosis not present

## 2022-06-10 ENCOUNTER — Ambulatory Visit (HOSPITAL_COMMUNITY)
Admission: RE | Admit: 2022-06-10 | Discharge: 2022-06-10 | Disposition: A | Discharge status: Home / Self Care | Payer: Medicare Other | Source: Ambulatory Visit | Attending: Adult Health | Admitting: Adult Health

## 2022-06-10 DIAGNOSIS — J8410 Pulmonary fibrosis, unspecified: Secondary | ICD-10-CM | POA: Insufficient documentation

## 2022-06-10 DIAGNOSIS — J8489 Other specified interstitial pulmonary diseases: Secondary | ICD-10-CM | POA: Diagnosis not present

## 2022-06-10 DIAGNOSIS — I700 Atherosclerosis of aorta: Secondary | ICD-10-CM | POA: Diagnosis not present

## 2022-06-10 DIAGNOSIS — J479 Bronchiectasis, uncomplicated: Secondary | ICD-10-CM | POA: Diagnosis not present

## 2022-06-10 DIAGNOSIS — R918 Other nonspecific abnormal finding of lung field: Secondary | ICD-10-CM | POA: Diagnosis not present

## 2022-06-10 DIAGNOSIS — J84112 Idiopathic pulmonary fibrosis: Secondary | ICD-10-CM | POA: Diagnosis not present

## 2022-06-10 DIAGNOSIS — M359 Systemic involvement of connective tissue, unspecified: Secondary | ICD-10-CM | POA: Diagnosis not present

## 2022-06-11 DIAGNOSIS — E1022 Type 1 diabetes mellitus with diabetic chronic kidney disease: Secondary | ICD-10-CM | POA: Diagnosis not present

## 2022-06-11 DIAGNOSIS — J849 Interstitial pulmonary disease, unspecified: Secondary | ICD-10-CM | POA: Diagnosis not present

## 2022-06-11 DIAGNOSIS — E1065 Type 1 diabetes mellitus with hyperglycemia: Secondary | ICD-10-CM | POA: Diagnosis not present

## 2022-06-11 DIAGNOSIS — M0589 Other rheumatoid arthritis with rheumatoid factor of multiple sites: Secondary | ICD-10-CM | POA: Diagnosis not present

## 2022-06-11 DIAGNOSIS — J9611 Chronic respiratory failure with hypoxia: Secondary | ICD-10-CM | POA: Diagnosis not present

## 2022-06-11 DIAGNOSIS — D72819 Decreased white blood cell count, unspecified: Secondary | ICD-10-CM | POA: Diagnosis not present

## 2022-06-11 DIAGNOSIS — N182 Chronic kidney disease, stage 2 (mild): Secondary | ICD-10-CM | POA: Diagnosis not present

## 2022-06-11 DIAGNOSIS — I10 Essential (primary) hypertension: Secondary | ICD-10-CM | POA: Diagnosis not present

## 2022-06-11 DIAGNOSIS — I700 Atherosclerosis of aorta: Secondary | ICD-10-CM | POA: Diagnosis not present

## 2022-06-11 DIAGNOSIS — Z0000 Encounter for general adult medical examination without abnormal findings: Secondary | ICD-10-CM | POA: Diagnosis not present

## 2022-06-11 DIAGNOSIS — J8410 Pulmonary fibrosis, unspecified: Secondary | ICD-10-CM | POA: Diagnosis not present

## 2022-06-11 DIAGNOSIS — E782 Mixed hyperlipidemia: Secondary | ICD-10-CM | POA: Diagnosis not present

## 2022-06-11 NOTE — Progress Notes (Signed)
Called and spoke with patient, provided results/recommendations per Rexene Edison NP.  He verbalized understanding and will keep f/u with Dr. Chase Caller next week.  Nothing further needed.

## 2022-06-18 ENCOUNTER — Telehealth: Payer: Self-pay | Admitting: Internal Medicine

## 2022-06-18 ENCOUNTER — Encounter: Payer: Self-pay | Admitting: Internal Medicine

## 2022-06-18 ENCOUNTER — Ambulatory Visit (INDEPENDENT_AMBULATORY_CARE_PROVIDER_SITE_OTHER): Payer: Medicare Other | Admitting: Internal Medicine

## 2022-06-18 ENCOUNTER — Ambulatory Visit: Payer: Medicare Other | Admitting: Internal Medicine

## 2022-06-18 VITALS — BP 122/58 | HR 85 | Ht 66.0 in | Wt 157.8 lb

## 2022-06-18 DIAGNOSIS — M359 Systemic involvement of connective tissue, unspecified: Secondary | ICD-10-CM

## 2022-06-18 DIAGNOSIS — J9611 Chronic respiratory failure with hypoxia: Secondary | ICD-10-CM

## 2022-06-18 DIAGNOSIS — J8489 Other specified interstitial pulmonary diseases: Secondary | ICD-10-CM | POA: Diagnosis not present

## 2022-06-18 DIAGNOSIS — R634 Abnormal weight loss: Secondary | ICD-10-CM | POA: Diagnosis not present

## 2022-06-18 DIAGNOSIS — I2723 Pulmonary hypertension due to lung diseases and hypoxia: Secondary | ICD-10-CM | POA: Diagnosis not present

## 2022-06-18 LAB — PULMONARY FUNCTION TEST
DL/VA % pred: 46 %
DL/VA: 1.86 ml/min/mmHg/L
DLCO cor % pred: 27 %
DLCO cor: 5.99 ml/min/mmHg
DLCO unc % pred: 22 %
DLCO unc: 5.01 ml/min/mmHg
FEF 25-75 Pre: 2.32 L/sec
FEF2575-%Pred-Pre: 131 %
FEV1-%Pred-Pre: 55 %
FEV1-Pre: 1.39 L
FEV1FVC-%Pred-Pre: 125 %
FEV6-%Pred-Pre: 47 %
FEV6-Pre: 1.53 L
FEV6FVC-%Pred-Pre: 107 %
FVC-%Pred-Pre: 43 %
FVC-Pre: 1.53 L
Pre FEV1/FVC ratio: 90 %
Pre FEV6/FVC Ratio: 100 %

## 2022-06-18 NOTE — Patient Instructions (Addendum)
Interstitial lung disease due to connective tissue disease (HCC) Chronic respiratory failure with hypoxia (HCC) Immunosuppressed and on high risk medication  - Progressive worsening of pulmonary fibrosis on symptoms, CT and PFT - worried outlook is bad - your antifibrotics did nnot cause your worsening - they were started bcuase  you were getting worse  Plan - continue o2 at 4L  - do spiro/dlco in 3 months -At this point in time no antifibrotic's because of side effects --Continue supportive care with oxygen therapy and physical conditioning -Immunomodulator therapy through the rheumatologist  - Rituxan but recommend considertion change to Actemra  - prednisone -Continue participation in ILD-pro registry -You need continued close monitoring intensive monitoring because of your high risk immunosuppressed status - if you decline to point you cannot do simple things at home we will have to discuss hospice but for now let us see if changing Rituxan to Actemra can help you  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  and tyvaso  -controlled this visit  Plan  - hycodan 44m twice daily as neded  (if you want refill on this)   WHO group 3 pulmonary arterial hypertension ([place]- Jan 2022  - you are inhaled treprostinil since late February 2022/early March 2022  Plan -  continue inhaled treprostinil   DPI inhaler 4 times daily  Mild emphysema  Plan  -incruse     Follow-up - Return to see Dr. RChase Callerfor standard of care visit in 30-minute slot in 3-4 months but after PFT  = ild symptom score and walk test at followup

## 2022-06-18 NOTE — Patient Instructions (Signed)
Spirometry and DLCO Performed Today.  

## 2022-06-18 NOTE — Telephone Encounter (Signed)
Raquel Sarna  - let patient know that I d/w  Aryal and he is aligned stoppin g  Rituxan sometime in Dec 2023 (6 month)O and at this time he wants our office to take over and do ACTEMRA for RA  Devki - pls facilitate  Thjanks    SIGNATURE    Dr. Brand Males, [name], [name],  Pulmonary and Critical Care Medicine Staff Physician, [place] Director - Interstitial Lung Disease  Program  Medical Director - [facility] [place] [facility] [place], [place], [address]  NPI Number:  NPI #[id number] [facility] Number: [id number]  Pager: [phone], If no answer  -> Check AMION or Try [phone] Telephone (clinical office): [phone] Telephone (research): [phone]  6:43 PM 06/18/2022

## 2022-06-18 NOTE — Progress Notes (Signed)
OV 01/01/2017  Chief Complaint  Patient presents with   Follow-up    Pt here after PFT. Pt states overall his breathing is unchanged since last OV. Pt c/o occ dry cough. Pt deneis CP/tightness and f/c/s.    Follow-up interstitial lung disease UIP pattern associated with rheumatoid arthritis and immune suppression. Last visit in March 2017. He doesn't that overall he has been stable. He has a new rheumatologist but still at [facility] Dr [name]. His wife also says he is stable. However as he started talking to them his wife slowly started telling me that for the last few to several months he is wheezing more than usual and is a little more short of breath than usual. However he denied it. This is compliant with his medications. [name] not on any inhaler therapy. He uses oxygen only at night. Previous CT scans of the chest in September 2016 in March 2017 did not report any associated emphysema. His most recent pulmonary function test is shown that on Combivent. I personally visualized the graph: [name] Kitchen Forced vital capacity is stable with a diffusion capacity seems to decline over time. Walking desaturation test 185 feet 3 laps on room air: He desaturated to 84%. And then we walked him on 2 L oxygen he did not desaturate.   OV 06/23/2017  Chief Complaint  Patient presents with   Follow-up    pt here after CT in 04/2017. Pt states his breathing is unchanged since last OV. Pt c/o interemittent dry cough. Pt denies CP/tightness and f/c/s.      Follow-up interstitial lung disease UIP pattern associated with rheumatoid arthritis and on Imuran. Last visit in March 2018 I was concerned about progressive interstitial lung disease because he started desaturating on room air and his FVC show decline. Currently rated a high-resolution CT chest that confirmed progressive lung disease. We walked him 185 feet 3 laps on room air and he desaturated at third lap to 89%. This is very  similar to previous visit suggesting that  that his decline has been more gradual over the last 1 year. Compared to last visit he feels stable. His wife feels the same. [name] aware of his worsening fibrosis and the potential need to change anti fibrotics  OV 09/27/2017  Chief Complaint  Patient presents with   Follow-up    PFT done today. Pt states that he thinks his breathing has become worse. Has complaints of SOB with exertion, occ. dry cough. Denies any CP. States that he does wear O2 at night, DME: [facility].     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  Patient is on Imuran.  [name] having progressive disease this year 2018.  He told my certified medical assistant that he is having progressive shortness of breath but to me he tells me that he is stable.  As always he is a very poor historian.  Wife gives a better history.  He has oxygen at home for the night which she uses but does not use his exertional small portable system even though this helps him.  They do admit that dyspnea is progressive.  In fact pulmonary function test today shows progressive disease.  He also seem to desaturate a little bit more easily.  He continues to be on Imuran.  In talking to him it appears his rheumatologist has now started him on Rituxan.  He has had 2 doses 2 weeks apart with the most  recent one being mid November 2018.  The next dose is at mid May 2019 which is 6-monthinterval.  He says he is up-to-date with his flu shot but is not had a conversation with his primary care physician about the new shingles vaccine that is inactivated vaccine.  [name] not having much of joint issues.  [name] aware that the Rituxan is specifically designed to slow down the progression of pulmonary fibrosis.  Walking desaturation test 185 feet x3 laps on room air with a forehead probe.  Resting heart rate was 95/min.  Final heart rate 108/min.  Resting pulse ox was 95%.  By the time he  finished his second lap he desaturated to 84%.  He was then placed on 2 L nasal cannula and he was able to maintain his saturation to 95%.   OV 12/20/2017  Chief Complaint  Patient presents with   Follow-up    follow up PFT,SOB with activity/exertion   [name] is here for follow-up because of his progressive UIP pattern pulmonary fibrosis related to rheumatoid arthritis.  At his last visit he was started on Rituxan by rheumatology.  Since then we started him on portable oxygen but he says he is not using it because he does not like to use it.  He does use oxygen at night which is helping.  His next dose of Rituxan is in May 2019.  Both he and his wife attest that since the start of Rituxan his symptoms are stable particularly shortness of breath is not any worse.  In fact pulmonary function test today shows continued ongoing stability with FVC and DLCO would suggest that the Rituxan might be helping him.  There are no new issues.  [name] here to have a shingles vaccine.  I told him about this last visit to talk to primary care physician but he does not remember this.   IMPRESSION: June 2018 1. Continued interval progression of fibrotic interstitial lung disease with extensive honeycombing, asymmetrically involving the right lung, with mild basilar predominance, diagnostic of usual interstitial pneumonia (UIP) . 2. Stable mild reactive mediastinal lymphadenopathy. 3. Aortic atherosclerosis.  One vessel coronary atherosclerosis.     Electronically Signed   By: [name]   On: 04/09/2017 10:51   OV 08/01/2018  Subjective:  Patient ID: [name] , DOB: 6Apr 05, 1947, age 76y.o. , MRN: [id number], [place] [id number]  08/01/2018 -   Chief Complaint  Patient presents with   Follow-up    PFT today, increased coughing (non-productive)    Follow-up interstitial lung disease UIP pattern associated with rheumatoid arthritis. Rx Rituxan since approx  Oct/Nov 2018. Background Immuran  HPI [name] 76y.o. -presents as usual with his wife.  He tells me that he continues on background Imuran.  [name] also on scheduled Rituxan.  He has an infusion coming up in a month or so.  This is according to his wife.  He continues to be a poor historian.  As best as I can gather from both of them is that his shortness of breath is actually a little bit better ever since starting Rituxan..  In fact his PFTs show continued improvement/stability.  With his FVC and DLCO.  However he tells me that his cough might be a little bit worse.  In talking to him it appears that is only mildly worse.  It happens mostly at night when he lies down.  He  coughs a few times and then it does not bother him.  The cough is dry.  He denies any postnasal drip or acid reflux disease.  He does not have any fever or sputum production.       OV 10/06/2018  Subjective:  Patient ID: Martinez Boxx, male , DOB: 1946/09/22 , age 48 y.o. , MRN: [id number] , ADDRESS: [place] [id number]   10/06/2018 -   Chief Complaint  Patient presents with   Follow-up    f/u pulmonary fibrosis, no symptoms, complaints, breathing doind well     HPI [name] 76 y.o. -returns for ILD follow-up in the setting of rheumatoid arthritis.  This time he is not here with his wife who has other schedule conflicts.  He tells me overall he stable.  He has now started Rituxan.  As again he is a poor historian.  I have him starting Rituxan a year ago but he told me that it was only started recently and has had only 1 infusion.  He tells me that he is stable.  His cough is well controlled with this anticholinergic he is using nighttime oxygen.  He says he is compliant with his medications.  Last visit I told him to inquire about using Bactrim prophylaxis in the setting of B-cell depletion in the setting of Rituxan.  He has no idea recollection of this conversation.  So I did tell him that we will check  his G6PD and discuss directly with his rheumatologist about starting Bactrim prophylaxis.  [name] okay with this plan.  Overall dyspnea and cough are stable and it is mild.  Walking desaturation test shows stability.     OV 06/29/2019  Subjective:  Patient ID: [name], male , DOB: 12/01/1945 , age 40 y.o. , MRN: [id number] , ADDRESS: [address] [place] [address]   Follow-up interstitial lung disease UIP pattern associated with rheumatoid arthritis. Rx Rituxan since approx Oct/Nov 2018. On Background Immuran, prednisone and dapsone for pcp prophylaxois = high risk medication use   06/29/2019 - ILD- RA  followup    HPI Diamonte Chinn 76 y.o. -presents for follow-up.  I personally saw him December 2019 before the onset of the pandemic.  He tells me that he is doing fair.  He feels he is overall stable.  He continues to be on Rituxan.  He tells me the last dose of this infusion was in May 05, 2019.  This is managed by rheumatology.  In addition he is on Imuran, prednisone.  Also by middle of year my nurse practitioner started him on dapsone for PCP prophylaxis.  He did have a high-resolution CT chest that shows UIP findings and stability since 2018.  However, he did desaturate with exertion when he saw the nurse practitioner just over a month ago.  This is a new finding as documented below.  We walked him again today and again found his desaturation with exertion.  It required 5 L to correct.  In past he has done well with RA at times but at times required 2L. He denies any fever or sputum production.  His previous echocardiogram was in 2013.  Most recent CT scan of the chest notes coronary artery calcification   Noted WBC going down after dapsine start Results for HOLTEN, SPANO (MRN [id number]) as of 06/29/2019 10:19  Ref. Range 05/30/2019 10:17 06/07/2019 09:07 06/13/2019 08:59 06/20/2019 09:50  WBC Latest Ref Range: 4.0 - 10.5 K/uL 8.8 6.2 4.3 3.4 (  L)     IMPRESSION: HRCT July 2020 1. No  significant interval change in severe pulmonary fibrosis, which is heterogeneously distributed and most severe in the right lung base, featuring extensive honeycombing and mild traction bronchiectasis. Although findings are not significantly changed compared to most recent examination dated 2018, they are clearly progressed over time in comparison to priors dating back to 2013. Findings remain in a UIP pattern by ATS pulmonary fibrosis criteria   2.  Coronary artery disease.     Electronically Signed   By: Eddie Candle [name]   On: 05/12/2019 13:14  ROS - per HPI   OV 01/04/2020  Subjective:  Patient ID: [name], male , DOB: [birth year] , age 66 y.o. , MRN: [id number] , [address] [place] [address]   01/04/2020 -   Chief Complaint  Patient presents with   Follow-up    Pt states he has been doing good since last visit. States he still has an occ cough.    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 Rx Rituxan since approx Oct/Nov 2018. On Background Immuran, prednisone and  = high risk medication use     HPI [name] 76 y.o. -returns for follow-up.  Last seen in the summer 2020.  Overall he is doing stable.  He continues with his immunosuppressive medications through rheumatology.  This include Imuran prednisone and Rituxan.  He had his infusion of Rituxan this week.  He feels stable but his pulmonary function test done for this visit shows a decline.  It is documented below his walking desaturation test also is a decline from the past but similar to last visit 6 months or so ago.  His main symptom is chronic cough.  [name] labeled it as a level 3 out of 5 but it appears it might actually be more severe.  [name] asking for palliative relief of cough.  His last liver function test with [place] was in the summer 2020.  It appears per review of the chart in the interim he has seen cardiology Dr. Percival Spanish for his coronary artery  calcification and is on expectant follow-up for that.  No stress test has been recommended because of the lack of chest pain.  His wife who is usually here with him is not here today.  He said she slept in.  [name] on MDI - no emphysema noted in prior CT    ROS - per HPI  OV 02/15/2020  Subjective:  Patient ID: [name], male , DOB: 15-Jan-1946 , age 75 y.o. , MRN: [id number] , ADDRESS: [place] [id number]   02/15/2020 -   Chief Complaint  Patient presents with   Follow-up    Follow-up interstitial lung disease UIP pattern associated with rheumatoid arthritis.   - Rx Rituxan since approx Oct/Nov 2018. On Background Immuran, prednisone and  = high risk medication use  - started ofev mid -  end of march 2021 for progressive disease    HPI [name] 76 y.o. -he has progressive ILD because of rheumatoid arthritis UIP pattern.  Last visit started him on nintedanib.  [name] now here to follow-up for tolerance and monitoring of nintedanib because this requires intensive therapeutic monitoring.  [name] also immunosuppressed.  He tells me that he is now started nintedanib approximately 1 month ago.  [name] tolerating it fine without any nausea vomiting diarrhea.  Last visit he took the consent [name]  for the ILD-pro registry study.  [name] agreed now to participate in it.  He meets inclusion exclusion criteria because of progressive disease.  At this point in time he is got minimal symptoms.  He desaturated as before.  However he is not compliant with his portable oxygen with exertion.  His cough is improved because of Hycodan.   OV 05/01/2020  Subjective:  Patient ID: [name], male , DOB: 03-19-1946 , age 20 y.o. , MRN: [id number] , ADDRESS: [place] [id number]   05/01/2020 -   Chief Complaint  Patient presents with   Follow-up    pt is here to to disuss ild    OV 05/30/2020   Subjective:  Patient ID: [name], male , DOB:  1945-12-06, age 54 y.o. years. , MRN: [id number],  ADDRESS: [address] [place] [address] PCP  Merrilee Seashore, MD Providers : Treatment Team:  Attending Provider: Brand Males, MD   Chief Complaint  Patient presents with   Follow-up    Pt states he has been doing good since last visit and states his breathing is about the same.     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   - Rx Rituxan since approx Oct/Nov 2018. On Background Immuran, prednisone and  = high risk medication use  - started ofev mid -  end of march 2021 for progressive disease -> changed to [facility]  June 2021    HPI [name] 76 y.o. -presents for follow-up.  This time he has brought his wife.  Have not seen the wife in a long time.  She appears to have lost weight.  She says she is dealing with her own health issues of unexplained weight loss.  She says she is abreast of his health issues when he returns home after visits.  However patient continues to be stoic and always states he is feeling well.  Last time he said he was feeling well but had high level of symptoms.  This time also he says he is feeling well and gaining weight and has marked very low level of symptoms.  Therefore overall his history is unreliable.  In the interim he has had his right heart catheterization.  He had nonobstructive coronary artery disease his.  His mean pulmonary artery pressure was elevated at 25.  Wedge pressure could not be elucidated.  His peripheral vascular resistance appears greater than 3.  Therefore he qualifies for nebulized treprostinil.  However he still in the uptake with his pirfenidone.  [name] not applying sunscreen and he says he will.  [name] tolerating pirfenidone well.  He has gained weight no nausea no vomiting no diarrhea.  He uses 4 L of pulsed oxygen at rest.  However at room air at rest he was normal.  He desaturated only after walking 2 laps.    OV  08/28/2020   Subjective:  Patient ID: [name], male , DOB: December 03, 1945, age 89 y.o. years. , MRN: [id number],  ADDRESS: [address] [place] [address] PCP  Merrilee Seashore, MD Providers : Treatment Team:  Attending Provider: Brand Males, MD Patient Care Team: Merrilee Seashore, MD as PCP - General (Internal Medicine) Hennie Duos, MD as Consulting Physician (Rheumatology) Lauraine Rinne, NP as Nurse Practitioner (Pulmonary Disease)    Chief Complaint  Patient presents with   Follow-up    coughs when starts eating.      Follow-up interstitial lung disease UIP  pattern associated with rheumatoid arthritis.   - Rx Rituxan since approx Oct/Nov 2018. On Background Immuran, prednisone and  = high risk medication use  - started ofev mid -  end of march 2021 for progressive disease -> changed to [facility]  June 2021 -> stopped sept 2021 due to rash     HPI Takota Fullard 76 y.o. -returns for follow-up.  At this visit he was supposed to do spirometry and DLCO but he has not.  He presents by himself.  It is unclear why he did not do spirometry and DLCO.  As usual he is stoic and does not give much of a history.  His symptom score appears stable.  His weight appears stable.  He uses 4 L of oxygen.  Between the last 2 visits he made phone calls suggesting initially that the pirfenidone was causing a rash in his upper arm but later the phone call said Spiriva.  We indicated that he should stop the Spiriva.  Today he tells me that he is actually continuing his Spiriva.  He tells me that the rash was caused by the pirfenidone/Esbriet.  Initially did not seem sure it was because of the [place] but latter when I repeated questions in multiple different ways he indicated it was desperate causing the rash therefore he stopped it.  He has been 1 month since he stopped it and the rash is disappeared.  The rash was in a nonexposed area in the right upper extremity.  He has had his Covid  vaccine but has not had his booster.  He continues to be on Rituxan which is tolerating well.  I did indicate to him that in the absence of B cells he might not have antibodies and he should get his Covid booster.  We will check Covid IgG as follow-up post booster needs continued intensive monitoring due to high complex status and immunosuppression    OV 12/03/2020  Subjective:  Patient ID: [name], male , DOB: [birth year] , age 31 y.o. , MRN: [id number] , ADDRESS: [address] [place] [address] PCP Merrilee Seashore, MD Patient Care Team: Merrilee Seashore, MD as PCP - General (Internal Medicine) Minus Breeding, MD as PCP - Cardiology (Cardiology) Hennie Duos, MD as Consulting Physician (Rheumatology) Lauraine Rinne, NP as Nurse Practitioner (Pulmonary Disease)  This Provider for this visit: Treatment Team:  Attending Provider: Brand Males, MD    12/03/2020 -   Chief Complaint  Patient presents with   Follow-up    F/U after hospital stay. States he has been feeling well since being home.      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   - Rx Rituxan since approx Oct/Nov 2018. On Background Immuran, prednisone and  = high risk medication use  - started ofev mid -  end of march 2021 for progressive disease -> changed to [facility]  June 2021 -> stopped sept 2021 due to rash  -Participant in ILD-pro registry study   Pulm HTn [place] 11/18/20 v- Dr Glori Bickers  Repeat [place] yesterday c/w mild to moderate pulmonary HTN with high cardiac output. PA = 59/18 (34). PCW = 10. PVR = 3.1  - Given PAH in setting of IPF, a trial of inhaled treprostinil seems reasonable    12/03/2020 [name] returns for follow-up.  After my last visit in October 2021 he decompensated in January 2022 and admitted to the hospital intensive care unit needing BiPAP for respiratory failure in  January 2022 last month.  I was in touch with the intensive  care physician Dr. Doyne Keel and heart failure specialist Dr. Haroldine Laws during this time.  They did a right heart catheterization in the results are above.  Patient does have WHO group 3 pulmonary hypertension.  Inhaled treprostinil is indicated.  Dr. Clayborne Dana pharmacist [facility] Riles working on this.  I just touch base with Dr. Haroldine Laws about this.  Patient is here with his wife.  He says he is much improved.  [name] back to his 3 L nasal cannula at baseline.  He reports having had his Covid booster in October 2021.  He had his booster after we found that his Covid IgG was negative.  Of note he is on Rituxan which suppresses IgG production.  He and his wife are interested in future therapeutic options.  [name] a participant in the ILD-pro registry.        OV 02/27/2021  Subjective:  Patient ID: [name], male , DOB: November 27, 1945 , age 32 y.o. , MRN: [id number] , ADDRESS: [address] [place] [address] PCP Merrilee Seashore, MD Patient Care Team: Merrilee Seashore, MD as PCP - General (Internal Medicine) Minus Breeding, MD as PCP - Cardiology (Cardiology) Hennie Duos, MD as Consulting Physician (Rheumatology) Lauraine Rinne, NP as Nurse Practitioner (Pulmonary Disease)  This Provider for this visit: Treatment Team:  Attending Provider: Brand Males, MD    02/27/2021 -   Chief Complaint  Patient presents with   Follow-up    Doing ok, breathing is stable     HPI [name] 76 y.o. -returns for follow-up.  He presents with his wife.  He endorses stability.  His symptom score shows worsening but I suspect this is unreliable because he never gives a good history.  He had pulmonary function test that shows improvement compared to the most recent 1 but still worse compared to February 2021.  In the interim he has been started on inhaled treprostinil as of March 2022.  There is some subset analysis evidence to suggest treprostinil can modulte PFTs [and is the question being  raised in the upcoming tETON trial and for IPF] he is on his immunomodulator treatment is finished COVID prophylaxis monoclonal antibody.  [name] able to participate today in the ILD Pro registry visit.      OV 06/30/2021  Subjective:  Patient ID: [name], male , DOB: May 17, 1946 , age 67 y.o. , MRN: [id number] , ADDRESS: [address] [place] [address] PCP Merrilee Seashore, MD Patient Care Team: Merrilee Seashore, MD as PCP - General (Internal Medicine) Minus Breeding, MD as PCP - Cardiology (Cardiology) Hennie Duos, MD as Consulting Physician (Rheumatology) Lauraine Rinne, NP as Nurse Practitioner (Pulmonary Disease)  This Provider for this visit: Treatment Team:  Attending Provider: Melvenia Needles, NP    06/30/2021 -   Chief Complaint  Patient presents with   Follow-up    Pt states coughing.    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   - Rx Rituxan since approx Oct/Nov 2018. On Background Immuran, prednisone and  = high risk medication use  - started ofev mid -  end of march 2021 for progressive disease -> changed to [facility]  June 2021 -> stopped sept 2021 due to rash -> not on anti-foibritc  -Participant in ILD-pro registry study   Pulm HTn [place] 11/18/20 - Dr Glori Bickers  Repeat [place] yesterday c/w mild to moderate pulmonary  HTN with high cardiac output. PA = 59/18 (34). PCW = 10. PVR = 3.1  -  started tyvaso early march 2022 - through Audry Riles - CHF Pharmacist  Associated mild emphysema [previous intolerance to  Spiriva)  - feb 2022 On inceuse  COvid prophylassix (IgG negative Feb 2022)  - tixagevimab/cilgavimab, for pre-exposure prophylaxis  Feb 2022 -= repeat dosing March 2022  HPI [name] 76 y.o. -returns for follow-up.  He presents with his wife.  He tells me that overall he is doing stable.  [name] not on antifibrotic's symptom score shows stability.  Pulmonary function test done?  Declining  versus stable.  See below.  [name] using 4 L of oxygen.  Wife states his cough is worse.  He then acknowledged that his cough is worse.  Wife rated as a 5 out of 5.  He says it does not affect his quality of life.  As always there is a discrepancy between his history and her history.  Nevertheless she feels it significant.  The sputum is clear.  There is no wheezing associated with this.  He has tried [employer] and other over-the-counter treatments but these have not helped.  We discussed doing Hycodan opioid cough relief for palliative management of cough in the setting of pulmonary fibrosis.  [name] open to this idea.  [name] taking inhaled Tyvaso for his pulmonary hypertension 4 times a day each time 9 puffs.  He feels it is helping.  At this point he is maxed out on therapy.  [name] doing prednisone, Imuran and Rituxan.  This for his rheumatoid arthritis.  If he declines he can always add CellCept.  [name] a participant in the research registry study.  His 78-monthvisit is anytime between now and November.  [name] willing to do his research visit today.   OV 11/24/2021  Subjective:  Patient ID: LAngelena [name] , DOB: 605-21-1947, age 76y.o. , MRN: [id number], ADDRESS: [address] [place] [id number] RMerrilee Seashore MD Patient Care Team: RMerrilee Seashore MD as PCP - General (Internal Medicine) HMinus Breeding MD as PCP - Cardiology (Cardiology) BHennie Duos MD as Consulting Physician (Rheumatology) MLauraine Rinne NP as Nurse Practitioner (Pulmonary Disease)  This Provider for this visit: Treatment Team:  Attending Provider: RBrand Males MD    11/24/2021 -   Chief Complaint  Patient presents with   Follow-up    F/U after SArlyce Harman States he has been stable since last visit. No new symptoms. Still using 4L of O2.     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   - Rx Rituxan since approx Oct/Nov 2018. On Background  Immuran, prednisone and  = high risk medication use  - started ofev mid -  end of march 2021 for progressive disease -> [facility] -> stopped sept 2021 due to rash -> not on anti-foibritc  -Participant in ILD-pro registry study   [facility] Dr DGlori Bickers Repeat [place] c/w mild to moderate pulmonary HTN with high cardiac output. PA = 59/18 (34). PCW = 10. PVR = 3.1  -  started tyvaso early march 2022 - through LAudry Riles- CHF Pharmacist  Associated mild emphysema [previous intolerance to  Spiriva)  - feb 2022 On inceuse  COvid prophylassix (IgG negative Feb 2022)  - tixagevimab/cilgavimab, for pre-exposure prophylaxis  Feb 2022 - repeat dosing March  2022  Chronic cough on -Hycodan opioid  HPI [name] 76 y.o. -presents for routine follow-up.  At this point in time he is Rituxan, Imuran and prednisone through the rheumatologist.  [name] on high risk prescription.  [name] not on any antifibrotic's because of side effects.  [name] getting inhaled treprostinil nebulizer through cardiology for his WHO group 3 pulmonary hypertension.  For his associated emphysema is run out of Incruse.  His cough is well controlled but for the last few weeks he is having increased yellow sputum but there is no change in his baseline wheeze which he continues to have intermittently.  Overall he states his symptoms are stable.  However on the symptom score below symptoms have progressed but again I doubt his accuracy and answering these questions.  [name] participant in ILD-Pro registry.  According to the research assistant next visit is only in April 2023.  He had pulmonary function test today and shows continued stability and personal interpretation.  Recently but overlal progression.         OV 02/24/2022  Subjective:  Patient ID: [name], male , DOB: 1946/03/22 , age 41 y.o. , MRN: [id number] , ADDRESS: [address] [place] [address] PCP Merrilee Seashore, MD Patient Care Team: Merrilee Seashore, MD as PCP - General (Internal Medicine) Minus Breeding, MD as PCP - Cardiology (Cardiology) Hennie Duos, MD as Consulting Physician (Rheumatology) Lauraine Rinne, NP as Nurse Practitioner (Pulmonary Disease)  This Provider for this visit: Treatment Team:  Attending Provider: Brand Males, MD    02/24/2022 -   Chief Complaint  Patient presents with   Follow-up    Pt states he is about the same since last visit. Still becomes SOB with exertion.  id  HPI [name] 76 y.o. -returns for his 65-monthfollow-up.  He says overall he is doing "fair".  He states he is stable.  [name] using 4 L nasal cannula.  His symptom score shows stability.  He continues to use treprostinil but this time is using a DPI.  He says he is tolerating it well except he has cough but the cough is severe although it is quite tolerable and is able to manage.  He would rather deal with the cough then to give up inhaled treprostinil.  He continues immunomodulators of Rituxan which he is due for next month, Imuran and also prednisone.  [name] continue to lose weight but he says current weight is satisfactory.  [name] a positive ILD-Pro registry visit but I do not know the schedule for that.  Overall he is stable.    05/13/2022 Acute OV : Weight loss, RA-ILD  Patient presents for a work in visit.  Patient complains that over the last 3 months his weight continues to go down he is down another 10 pounds.  He has increased fatigue decreased activity tolerance and shortness of breath. Complains of increased cough, congestion with thick yellow mucus . No body aches or fever. No increased joint pain . No joint swelling or redness . Low appetite, no desire to eat. No abdominal pain, n/v/d. No dysuria . No hemoptysis.  Patient is followed for rheumatoid arthritis associated interstitial lung disease with UIP pattern.  He has chronic respiratory failure on home oxygen.  [name]  followed by rheumatology is on Imuran and low-dose prednisone.  Prednisone dose is at 3 mg daily.  Patient is had no increased oxygen demands.  Remains on 4 L of  oxygen.  Patient has been intolerant to antifibrotic's in the past.  He has ILD associated pulmonary hypertension.  [name] on Tyvaso. Says doing well  on it without headache or dizziness.  No recent labs or xray. Last CT chest November 15, 2020 shows trace left pleural effusion extensive patchy groundglass opacity and consolidation throughout both lungs superimposed on chronic patchy reticulation and moderate traction bronchiectasis with extensive honeycombing and architectural distortion findings suggestive of a multi lobar pneumonia superimposed on previously documented UIP.   OV 06/18/2022  Subjective:  Patient ID: [name], male , DOB: 03-11-1946 , age 41 y.o. , MRN: [id number] , ADDRESS: [place] [address] [place] [address] PCP Merrilee Seashore, MD Patient Care Team: Merrilee Seashore, MD as PCP - General (Internal Medicine) Minus Breeding, MD as PCP - Cardiology (Cardiology) Hennie Duos, MD as Consulting Physician (Rheumatology) Lauraine Rinne, NP as Nurse Practitioner (Pulmonary Disease)  This Provider for this visit: Treatment Team:  Attending Provider: Brand Males, MD    06/18/2022 -   Chief Complaint  Patient presents with   Follow-up    PFT performed today.  Pt states his breathing has become worse since last visit. States that he is becoming short-winded quicker. States that he has been coughing a lot.    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   - Rx Rituxan since approx Oct/Nov 2018.  - On immuran,  -On prednisone and  = high risk medication use  - started ofev mid -  end of march 2021 for progressive disease -> changed to [facility]  June 2021 -> stopped sept 2021 due to rash -> not on anti-foibritc  -Participant in ILD-pro registry study   Pulm  HTn [place] 11/18/20 - Dr Quillian Quince Bensimhon  Repeat [place]  c/w mild to moderate pulmonary HTN with high cardiac output. PA = 59/18 (34). PCW = 10. PVR = 3.1  -  started tyvaso early march 2022 - through Audry Riles - CHF Pharmacist  Associated mild emphysema [previous intolerance to  Spiriva)  - feb 2022 On inceuse  COvid prophylassix (IgG negative Feb 2022)  - tixagevimab/cilgavimab, for pre-exposure prophylaxis  Feb 2022 - repeat dosing March 2022  Chronic cough on -Hycodan opio  HPI [name] 76 y.o. -presents with wife. Initially said he is "fair  ok": but he has lost more weight. Needing 4L [place] . On Rituxan (last June 2023), prednisone and tyvaso. He did admit to weight loss. Also saying more dyspneic. Cannot do groceries from his car to house anymore but can do other ADLs in house. He feels anti-fibrotics made him worse. Had CT and PFT - personally visualized and showe him CT - both are getting worse.D/w Dr Kathlene November his rheum  he is supportive of my idea to change rituxan to actemra but wants [place] to do it      SYMPTOM SCALE - ILD 06/29/2019  01/04/2020  02/15/2020  05/01/2020  05/30/2020  08/28/2020  02/27/2021 On tyvasos 06/30/2021  11/24/2021 tyvaso 02/24/2022  06/18/2022 Tyvaso, rituxan, prednisone  O2 use Uses 3-5L with exertion yuses 3-5L with exertion Non compliant 168# and 4L with exertion - on esbriet 172# on esbriet - 4L at rest  - filled bu him and wife 174# 4L o2 172# 4L O2  169#. 4L 165# 157#  Shortness of Breath 0 -> 5 scale with 5 being worst (score 6 If unable to do)            At  rest 3 0 0 4 0 0 2 0 _0 Simple tasks - showers, clothes change, eating, shaving 2 1 0 5 0 0 _1 Household (dishes, doing bed, laundry) 2 1 0 5 0  0 _2 Shopping _3 0 0 0 _4 Walking level at own pace _5 0 0 _6 Walking up Stairs _7 Total (40 - 48) Dyspnea Score _8 How bad is your cough? _9 How  bad is your fatigue 2 1 0 4 0 0 _10 nasuea    4 0 0 0 _11 vomit    0 0 0 0 00 0 0 0  diarrhea  0 0 0 0 0 0 0 0 0 4  anxiety  0 0 0 0 0 0 00 0 0 0  deipression  0 0 0 0 0 0 0 0 0 0  0     Simple office walk 08/01/2018 -  185 feet x  3 laps goal with forehead probe at [place]  10/06/2018 250 feet x 3 laps 06/29/2019  01/04/2020  02/15/2020  05/30/2020  12/03/2020 Using 3L [place] at home all time 02/27/2021 Uses 4L at home 02/24/2022 4L pulses  O2 used _12  raoom air after turing o2 off fx 5 mn Room air Room air 4L   Number laps completed 3 3 Aim for 3 1 Did only 1 of 3 Did all 3 Did only 2 laps Did only 0.5 lap desats at 0.5 of 3 l;aps Didd n1 lap  Comments about pace normal normal  avg pace avg opae avg pagce avg pace  avg  Resting Pulse Ox/HR 100% and 80/min 100% and 81/min 94% and 96/min 96% and 101/min 99% and 98/min 96% and 94/min 96% and HR 96 89% RA -> 95 % 100% and HR 83  Final Pulse Ox/HR 94% and 114/min 91% and 111/min 86% at first lap, HR 131 86% and 118/min 88% and 123/min 83% and 125/,n 85% and HR 124 at half lap 87% at half lap 90% and HR 116  Desaturated </= 88% no no  yes yes    no  Desaturated <= 3% points yes yes  yes yes    yes  Got Tachycardic >/= 90/min yes yes  yes yes    yes  Symptoms at end of test x x  Mild dyspnea dyspnea    Severe dyspnea stopeda  1 lap  Miscellaneous comments x x Needed 5L [place] to correct    Corrected with 4L [place] and did noly 2 laps        PFT     Latest Ref Rng & Units 06/18/2022   10:10 AM 11/24/2021    8:54 AM 06/30/2021    1:20 PM 02/27/2021    9:55 AM 04/08/2020    9:49 AM 12/26/2019    8:54 AM 08/01/2018    9:39 AM  PFT Results  FVC-Pre L 1.53  P 1.78  1.76  1.85  1.73  1.96  2.09   FVC-Predicted Pre % 43  P 56  56  58  54  61  69   Pre FEV1/FVC % % 90  P 85  87  89  90  86  89   FEV1-Pre L 1.39  P 1.52  1.53  1.65  1.55  1.69  1.87   FEV1-Predicted Pre % 55  P 66  66  71  66  72  84    DLCO uncorrected ml/min/mmHg 5.01  P 6.59  7.62  10.01  8.10  13.14  13.24   DLCO UNC% % 22  P 29  34  45  36  58  52   DLCO corrected ml/min/mmHg 5.99  P 6.59  7.62  10.01  9.15     DLCO COR %Predicted % 27  P 29  34  45  41     DLVA Predicted % 46  P 46  55  67  68  88  95     P Preliminary result   HRCT A8/9/23  IMPRESSION: 1. Fibrotic interstitial lung disease with exuberant honeycomb change and a basilar predominance, compatible with RA associated ILD. Findings have progressed when compared with February 17, 2020 prior. Findings are consistent with UIP per consensus guidelines: Diagnosis of Idiopathic Pulmonary Fibrosis: An Official ATS/ERS/JRS/ALAT Clinical Practice Guideline. Experiment, Iss 5, [phone], Jul 03 2017. 2. New irregular nodular opacity of the lingula measuring 9 mm in mean diameter, possibly due to progressive focal fibrosis, although new pulmonary nodule can not be excluded. Recommend follow-up chest CT in 3 months. 3. Stable mild mediastinal lymphadenopathy, likely reactive. 4. Aortic Atherosclerosis (ICD10-I70.0).     Electronically Signed   By: Yetta Glassman [name]   On: 06/11/2022 10:11     has a past medical history of Arthritis, COPD (chronic obstructive pulmonary disease) ([place]), Diabetes mellitus, Hypertension, Pneumonia, and Thyroid disease.   reports that he quit smoking about 37 years ago. His smoking use included cigarettes. He has a 5.00 pack-year smoking history. He has never used smokeless tobacco.  Past Surgical History:  Procedure Laterality Date   KNEE ARTHROSCOPY     NO PAST SURGERIES     RIGHT HEART CATH N/A 11/18/2020   Procedure: RIGHT HEART CATH;  Surgeon: Jolaine Artist, MD;  Location: [facility];  Service: Cardiovascular;  Laterality: N/A;   RIGHT/LEFT HEART CATH AND CORONARY ANGIOGRAPHY N/A 05/21/2020   Procedure: RIGHT/LEFT HEART CATH AND CORONARY ANGIOGRAPHY;  Surgeon: Martinique, Peter M, MD;   Location: [facility];  Service: Cardiovascular;  Laterality: N/A;    Allergies  Allergen Reactions   Bactrim [Sulfamethoxazole-Trimethoprim] Shortness Of Breath    Patient states "got short winded"   Spiriva Respimat [Tiotropium Bromide Monohydrate] Rash   Ofev [Nintedanib]     Immunization History  Administered Date(s) Administered   Fluad Quad(high Dose 65+) 08/12/2020   Influenza Split 08/03/2011, 07/03/2012, 09/02/2013, 07/03/2014   Influenza, High Dose Seasonal PF 07/30/2014, 07/15/2016, 08/02/2016, 08/02/2017, 07/25/2018, 07/04/2019   Influenza, Quadrivalent, Recombinant, Inj, Pf 08/11/2017, 07/05/2018, 07/11/2019, 08/15/2020, 07/28/2021   Influenza,inj,Quad PF,6+ Mos 07/29/2015   Influenza-Unspecified 07/23/2012   Moderna Sars-Covid-2 Vaccination 08/01/2021   PFIZER(Purple Top)SARS-COV-2 Vaccination 01/01/2020, 01/13/2020, 02/05/2020, 08/31/2020   Pneumococcal Conjugate-13 08/20/2015, 08/01/2018   Pneumococcal Polysaccharide-23 11/03/2007, 12/04/2007, 01/12/2018   Zoster Recombinat (Shingrix) 04/11/2019    Family History  Problem Relation Age of Onset   Diabetes Mother    Stroke Mother      Current Outpatient Medications:    acetaminophen (TYLENOL) 500 MG tablet, Take 1,000  mg by mouth every 6 (six) hours as needed for moderate pain or headache., Disp: , Rfl:    albuterol (VENTOLIN HFA) 108 (90 Base) MCG/ACT inhaler, Inhale 2 puffs into the lungs every 6 (six) hours as needed., Disp: , Rfl:    amLODipine (NORVASC) 10 MG tablet, Take 10 mg by mouth daily., Disp: , Rfl:    aspirin 81 MG tablet, Take 81 mg by mouth daily., Disp: , Rfl:    atorvastatin (LIPITOR) 20 MG tablet, Take 20 mg by mouth daily., Disp: , Rfl: 2   azaTHIOprine (IMURAN) 50 MG tablet, Take 100 mg by mouth daily. , Disp: , Rfl:    fluticasone (FLONASE) 50 MCG/ACT nasal spray, Place 2 sprays into both nostrils daily., Disp: 16 g, Rfl: 2   folic acid (FOLVITE) 315 MCG tablet, Take 400 mcg by mouth  daily., Disp: , Rfl:    HYDROcodone bit-homatropine (HYCODAN) 5-1.5 MG/5ML syrup, Take 5 mLs by mouth 2 (two) times daily as needed for cough., Disp: 240 mL, Rfl: 0   insulin NPH Human (NOVOLIN N) 100 UNIT/ML injection, Inject 12 Units into the skin See admin instructions. Inject 10 units every morning and 20 units every evening, Disp: , Rfl:    Insulin Syringe-Needle U-100 (INSULIN SYRINGE .3CC/31GX5/16") 31G X 5/16" 0.3 ML MISC, Inject 1 Syringe into the skin as directed., Disp: , Rfl:    levothyroxine (SYNTHROID, LEVOTHROID) 25 MCG tablet, Take 25 mcg by mouth daily., Disp: , Rfl:    metFORMIN (GLUCOPHAGE-XR) 500 MG 24 hr tablet, Take 2 tablets (1,000 mg total) by mouth 2 (two) times daily., Disp: , Rfl:    pantoprazole (PROTONIX) 40 MG tablet, Take 40 mg by mouth daily., Disp: , Rfl:    predniSONE (DELTASONE) 1 MG tablet, Take 3 mg by mouth daily., Disp: , Rfl:    Treprostinil (TYVASO DPI MAINTENANCE KIT) 48 MCG POWD, Inhale 48 mcg into the lungs in the morning, at noon, in the evening, and at bedtime., Disp: , Rfl:    umeclidinium bromide (INCRUSE ELLIPTA) 62.5 MCG/ACT AEPB, Inhale 1 puff into the lungs daily., Disp: 30 each, Rfl: 5      Objective:   Vitals:   06/18/22 1053  BP: (!) 122/58  Pulse: 85  SpO2: 91%  Weight: 157 lb 12.8 oz (71.6 kg)  Height: 5' 6" (1.676 m)    Estimated body mass index is 25.47 kg/m as calculated from the following:   Height as of this encounter: 5' 6" (1.676 m).   Weight as of this encounter: 157 lb 12.8 oz (71.6 kg).  _0 @  Filed Weights   06/18/22 1053  Weight: 157 lb 12.8 oz (71.6 kg)     Physical Exam General: No distress. Look lean but stabl Neuro: Alert and Oriented x 3. GCS 15. Speech normal Psych: Pleasant Resp:  Barrel Chest - no.  Wheeze - no, Crackles - YES, No overt respiratory distress CVS: Normal heart sounds. Murmurs - NO Ext: Stigmata of Connective Tissue Disease - RA and clubbing HEENT: Normal upper airway. PEERL  +. No post nasal drip        Assessment:       ICD-10-CM   1. Interstitial lung disease due to connective tissue disease ([place])  J84.89 Pulmonary function test   M35.9     2. Chronic respiratory failure with hypoxia (H[facility])  J96.11 Pulmonary function test    3. WHO group 3 pulmonary arterial hypertension (HCC)  I27.23     4. Weight loss  R63.4          Plan:     Patient Instructions  Interstitial lung disease due to connective tissue disease ([place]) Chronic respiratory failure with hypoxia ([place]) Immunosuppressed and on high risk medication  - Progressive worsening of pulmonary fibrosis on symptoms, CT and PFT - worried outlook is bad - your antifibrotics did nnot cause your worsening - they were started bcuase  you were getting worse  Plan - continue o2 at 4L [place] - do spiro/dlco in 3 months -At this point in time no antifibrotic's because of side effects --Continue supportive care with oxygen therapy and physical conditioning -Immunomodulator therapy through the rheumatologist  - Rituxan but recommend considertion change to Actemra  - prednisone -Continue participation in ILD-pro registry -You need continued close monitoring intensive monitoring because of your high risk immunosuppressed status - if you decline to point you cannot do simple things at home we will have to discuss hospice but for now let us see if changing Rituxan to Actemra can help you  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 RChase Callerfor standard of care visit in 30-minute slot in 3-4 months but after PFT  = ild symptom score  and walk test at followup   High complex condition with requirement for intensive Rx monitoring  SIGNATURE    Dr. MBrand Males [name], [name],  Pulmonary and Critical Care Medicine Staff Physician, CAndalusiaDirector - Interstitial Lung Disease  Program  Pulmonary [place] [id number] Pager: [phone] If no answer or between  15:00h - 7:00h: call [phone] Telephone: [phone]  6:47 PM 06/18/2022

## 2022-06-18 NOTE — Progress Notes (Signed)
Spirometry and DLCO Performed Today.  

## 2022-06-19 ENCOUNTER — Other Ambulatory Visit (HOSPITAL_COMMUNITY): Payer: Self-pay

## 2022-06-19 ENCOUNTER — Telehealth: Payer: Self-pay | Admitting: Pharmacist

## 2022-06-19 DIAGNOSIS — J8489 Other specified interstitial pulmonary diseases: Secondary | ICD-10-CM

## 2022-06-19 DIAGNOSIS — Z79899 Other long term (current) drug therapy: Secondary | ICD-10-CM

## 2022-06-19 DIAGNOSIS — M359 Systemic involvement of connective tissue, unspecified: Secondary | ICD-10-CM

## 2022-06-19 NOTE — Telephone Encounter (Signed)
Received notification from [facility] regarding a prior authorization for [name]. Authorization has been APPROVED from 06/19/22 to 12/20/22.   Per test claim, copay for 28 days supply is $10.35  Patient can fill through [facility] [place]: [phone]   Authorization # [id number]  Knox Saliva, PharmD, MPH, BCPS, CPP Clinical Pharmacist (Rheumatology and Pulmonology)

## 2022-06-19 NOTE — Telephone Encounter (Signed)
Pt was made aware at [place]

## 2022-06-19 NOTE — Telephone Encounter (Signed)
Submitted a Prior Authorization request to [facility] for ACTEMRA via CoverMyMeds. Will update once we receive a response.  Key: [id number]  Dose for RA: 129m every 14 days based on wt <100kg  DKnox Saliva PharmD, MPH, BCPS, CPP Clinical Pharmacist (Rheumatology and Pulmonology)

## 2022-06-19 NOTE — Telephone Encounter (Signed)
Will start Actemra benefits investigation in separate telephone encounter  Knox Saliva, PharmD, MPH, BCPS, CPP Clinical Pharmacist (Rheumatology and Pulmonology)

## 2022-06-23 NOTE — Telephone Encounter (Signed)
Correct per Aryal because Rituxan is on board. Although we could make caes because he is progressing we can start "now" based on progession but need to discuss the immune suppression double effect. So, I am ok with starting paper work now

## 2022-06-23 NOTE — Telephone Encounter (Signed)
Patient is already approved for Actemra. At rheumatology, we generally wait for 6 month post-Rituxan mark prior to starting Actemra due to risks with immunosuppression overlap.  Knox Saliva, PharmD, MPH, BCPS, CPP Clinical Pharmacist (Rheumatology and Pulmonology)

## 2022-06-24 ENCOUNTER — Encounter (HOSPITAL_COMMUNITY): Payer: Self-pay | Admitting: Emergency Medicine

## 2022-06-24 ENCOUNTER — Other Ambulatory Visit: Payer: Self-pay

## 2022-06-24 ENCOUNTER — Emergency Department (HOSPITAL_COMMUNITY)
Admission: EM | Admit: 2022-06-24 | Discharge: 2022-06-25 | Disposition: A | Discharge status: Home / Self Care | Payer: Medicare Other | Attending: Emergency Medicine | Admitting: Emergency Medicine

## 2022-06-24 DIAGNOSIS — Z7982 Long term (current) use of aspirin: Secondary | ICD-10-CM | POA: Diagnosis not present

## 2022-06-24 DIAGNOSIS — R339 Retention of urine, unspecified: Secondary | ICD-10-CM | POA: Insufficient documentation

## 2022-06-24 DIAGNOSIS — Z8546 Personal history of malignant neoplasm of prostate: Secondary | ICD-10-CM | POA: Diagnosis not present

## 2022-06-24 DIAGNOSIS — R350 Frequency of micturition: Secondary | ICD-10-CM | POA: Diagnosis not present

## 2022-06-24 DIAGNOSIS — Z794 Long term (current) use of insulin: Secondary | ICD-10-CM | POA: Diagnosis not present

## 2022-06-24 LAB — URINALYSIS, ROUTINE W REFLEX MICROSCOPIC
Bilirubin Urine: NEGATIVE
Glucose, UA: NEGATIVE mg/dL
Hgb urine dipstick: NEGATIVE
Ketones, ur: NEGATIVE mg/dL
Leukocytes,Ua: NEGATIVE
Nitrite: NEGATIVE
Protein, ur: NEGATIVE mg/dL
Specific Gravity, Urine: 1.006 (ref 1.005–1.030)
pH: 8 (ref 5.0–8.0)

## 2022-06-24 LAB — CBC WITH DIFFERENTIAL/PLATELET
Abs Immature Granulocytes: 0.02 10*3/uL (ref 0.00–0.07)
Basophils Absolute: 0 10*3/uL (ref 0.0–0.1)
Basophils Relative: 1 %
Eosinophils Absolute: 0.1 10*3/uL (ref 0.0–0.5)
Eosinophils Relative: 1 %
HCT: 36.1 % — ABNORMAL LOW (ref 39.0–52.0)
Hemoglobin: 11.1 g/dL — ABNORMAL LOW (ref 13.0–17.0)
Immature Granulocytes: 0 %
Lymphocytes Relative: 7 %
Lymphs Abs: 0.4 10*3/uL — ABNORMAL LOW (ref 0.7–4.0)
MCH: 29.5 pg (ref 26.0–34.0)
MCHC: 30.7 g/dL (ref 30.0–36.0)
MCV: 96 fL (ref 80.0–100.0)
Monocytes Absolute: 0.6 10*3/uL (ref 0.1–1.0)
Monocytes Relative: 10 %
Neutro Abs: 4.7 10*3/uL (ref 1.7–7.7)
Neutrophils Relative %: 81 %
Platelets: 269 10*3/uL (ref 150–400)
RBC: 3.76 MIL/uL — ABNORMAL LOW (ref 4.22–5.81)
RDW: 19.1 % — ABNORMAL HIGH (ref 11.5–15.5)
WBC: 5.8 10*3/uL (ref 4.0–10.5)
nRBC: 0 % (ref 0.0–0.2)

## 2022-06-24 LAB — BASIC METABOLIC PANEL
Anion gap: 13 (ref 5–15)
BUN: 9 mg/dL (ref 8–23)
CO2: 27 mmol/L (ref 22–32)
Calcium: 9 mg/dL (ref 8.9–10.3)
Chloride: 102 mmol/L (ref 98–111)
Creatinine, Ser: 0.99 mg/dL (ref 0.61–1.24)
GFR, Estimated: >60 mL/min (ref >60)
Glucose, Bld: 109 mg/dL — ABNORMAL HIGH (ref 70–99)
Potassium: 4.4 mmol/L (ref 3.5–5.1)
Sodium: 142 mmol/L (ref 135–145)

## 2022-06-24 NOTE — Discharge Instructions (Addendum)
You were evaluated in the Emergency Department and after careful evaluation, we did not find any emergent condition requiring admission or further testing in the hospital.  Your exam/testing today is overall reassuring.  Please call and make appointment with urologist first thing tomorrow morning for urinary retention and indwelling Foley catheter.  Please return to the Emergency Department if you experience any worsening of your condition.   Thank you for allowing [place] to be a part of your care.

## 2022-06-24 NOTE — ED Provider Notes (Signed)
  Provider Note MRN:  [id number]  Arrival date & time: 06/25/22    ED Course and Medical Decision Making  Assumed care from Dr. Wallace Cullens at shift change.  Urinary retention awaiting urinalysis, anticipating discharge on Foley.  Procedures  Final Clinical Impressions(s) / ED Diagnoses     ICD-10-CM   1. Urinary retention  R33.9       ED Discharge Orders     None         Discharge Instructions      You were evaluated in the Emergency Department and after careful evaluation, we did not find any emergent condition requiring admission or further testing in the hospital.  Your exam/testing today is overall reassuring.  Please call and make appointment with urologist first thing tomorrow morning for urinary retention and indwelling Foley catheter.  Please return to the Emergency Department if you experience any worsening of your condition.   Thank you for allowing [place] to be a part of your care.      Elmer Sow. Pilar Plate, MD [facility] Emergency Medicine [facility] [email or web address]    Sabas Sous, MD 06/25/22 [phone]

## 2022-06-24 NOTE — ED Triage Notes (Signed)
Pt reports urinary urgency and lower abdominal pain since yesterday and states he is unable to urinate only "a few drops" at a time.

## 2022-06-24 NOTE — ED Provider Notes (Signed)
[facility] Provider Note   CSN: [id number] Arrival date & time: 06/24/22  1900     History  No chief complaint on file.   [name] is a 76 y.o. male.  Patient is a 76 year old male presenting for complaints of difficulty urinating.  Patient states he feels like he is unable to urinate, only producing small drops each day, abdominal fullness.  Patient denies any fevers, chills, nausea, vomiting, diarrhea.  Denies any dysuria.  States he has had urinary retention in the past with prostate cancer.  The history is provided by the patient. No language interpreter was used.       Home Medications Prior to Admission medications   Medication Sig Start Date End Date Taking? Authorizing Provider  acetaminophen (TYLENOL) 500 MG tablet Take 1,000 mg by mouth every 6 (six) hours as needed for moderate pain or headache.    [provider]  albuterol (VENTOLIN HFA) 108 (90 Base) MCG/ACT inhaler Inhale 2 puffs into the lungs every 6 (six) hours as needed. 11/22/17   [provider]  amLODipine (NORVASC) 10 MG tablet Take 10 mg by mouth daily.    [provider]  aspirin 81 MG tablet Take 81 mg by mouth daily.    [provider]  atorvastatin (LIPITOR) 20 MG tablet Take 20 mg by mouth daily. 08/19/18   [provider]  azaTHIOprine (IMURAN) 50 MG tablet Take 100 mg by mouth daily.  04/13/19   [provider]  fluticasone (FLONASE) 50 MCG/ACT nasal spray Place 2 sprays into both nostrils daily. 11/08/20   Brand Males, MD  folic acid (FOLVITE) 416 MCG tablet Take 400 mcg by mouth daily.    [provider]  HYDROcodone bit-homatropine (HYCODAN) 5-1.5 MG/5ML syrup Take 5 mLs by mouth 2 (two) times daily as needed for cough. 12/15/21   Brand Males, MD  insulin NPH Human (NOVOLIN N) 100 UNIT/ML injection Inject 12 Units into the skin See admin instructions. Inject 10 units every morning and 20 units every  evening    [provider]  Insulin Syringe-Needle U-100 (INSULIN SYRINGE .3CC/31GX5/16") 31G X 5/16" 0.3 ML MISC Inject 1 Syringe into the skin as directed. 02/27/20   [provider]  levothyroxine (SYNTHROID, LEVOTHROID) 25 MCG tablet Take 25 mcg by mouth daily.    [provider]  metFORMIN (GLUCOPHAGE-XR) 500 MG 24 hr tablet Take 2 tablets (1,000 mg total) by mouth 2 (two) times daily. 05/24/20   Martinique, Peter M, MD  pantoprazole (PROTONIX) 40 MG tablet Take 40 mg by mouth daily. 01/14/20   [provider]  predniSONE (DELTASONE) 1 MG tablet Take 3 mg by mouth daily. 02/12/21   [provider]  Treprostinil (TYVASO DPI MAINTENANCE KIT) 48 MCG POWD Inhale 48 mcg into the lungs in the morning, at noon, in the evening, and at bedtime.    [provider]  umeclidinium bromide (INCRUSE ELLIPTA) 62.5 MCG/ACT AEPB Inhale 1 puff into the lungs daily. 02/24/22   Brand Males, MD      Allergies    Bactrim [sulfamethoxazole-trimethoprim], Spiriva respimat [tiotropium bromide monohydrate], and Ofev [nintedanib]    Review of Systems   Review of Systems  Constitutional:  Negative for chills and fever.  HENT:  Negative for ear pain and sore throat.   Eyes:  Negative for pain and visual disturbance.  Respiratory:  Negative for cough and shortness of breath.   Cardiovascular:  Negative for chest pain and palpitations.  Gastrointestinal:  Negative for abdominal pain and vomiting.  Genitourinary:  Positive for difficulty urinating. Negative for dysuria and hematuria.  Musculoskeletal:  Negative for arthralgias and back pain.  Skin:  Negative for color change and rash.  Neurological:  Negative for seizures and syncope.  All other systems reviewed and are negative.   Physical Exam Updated Vital Signs BP (!) 154/67 (BP Location: Right Arm)   Pulse 98   Temp 98.4 F (36.9 C)   Resp 18   Ht 5' 6" (1.676 m)   Wt 70.8 kg   SpO2 92%   BMI 25.18  kg/m  Physical Exam Vitals and nursing note reviewed.  Constitutional:      General: [name] not in acute distress.    Appearance: [name] well-developed.  HENT:     Head: Normocephalic and atraumatic.  Eyes:     Conjunctiva/sclera: Conjunctivae normal.  Cardiovascular:     Rate and Rhythm: Normal rate and regular rhythm.     Heart sounds: No murmur heard. Pulmonary:     Effort: Pulmonary effort is normal. No respiratory distress.     Breath sounds: Normal breath sounds.  Abdominal:     Palpations: Abdomen is soft.     Tenderness: There is abdominal tenderness in the suprapubic area.  Musculoskeletal:        General: No swelling.     Cervical back: Neck supple.  Skin:    General: Skin is warm and dry.     Capillary Refill: Capillary refill takes less than 2 seconds.  Neurological:     Mental Status: [name] alert.  Psychiatric:        Mood and Affect: Mood normal.     ED Results / Procedures / Treatments   Labs (all labs ordered are listed, but only abnormal results are displayed) Labs Reviewed  CBC WITH DIFFERENTIAL/PLATELET - Abnormal; Notable for the following components:      Result Value   RBC 3.76 (*)    Hemoglobin 11.1 (*)    HCT 36.1 (*)    RDW 19.1 (*)    Lymphs Abs 0.4 (*)    All other components within normal limits  BASIC METABOLIC PANEL - Abnormal; Notable for the following components:   Glucose, Bld 109 (*)    All other components within normal limits  URINALYSIS, ROUTINE W REFLEX MICROSCOPIC    EKG None  Radiology No results found.  Procedures Procedures    Medications Ordered in ED Medications - No data to display  ED Course/ Medical Decision Making/ A&P                           Medical Decision Making Amount and/or Complexity of Data Reviewed Labs: ordered.    76-year-old male presenting for complaints of difficulty urinating.  Patient is alert and oriented x3, no acute distress, afebrile, stable vital signs.  Tenderness palpation of  suprapubic abdominal region.  Postvoid bladder scan demonstrates retention of greater than 800 mL.  Foley catheter placed.  Urine analysis pending.  Currently no signs or symptoms of sepsis.        Final Clinical Impression(s) / ED Diagnoses Final diagnoses:  Urinary retention    Rx / DC Orders ED Discharge Orders     None         ,  P, DO 06/24/22 2343  

## 2022-06-24 NOTE — ED Provider Triage Note (Signed)
Emergency Medicine Provider Triage Evaluation Note  Jamarkis Branam , a 76 y.o. male  was evaluated in triage.  Pt complains of feeling of urinary retention, he reports that he has only been able to urinate a few drops the last few days.  Patient reports no previous history of same previous history of prostate cancer.  He does currently have lung cancer and is on supplemental oxygen at baseline, reports that he is not currently undergoing radiation, chemotherapy for his lung cancer.  Denies fever, chills, nausea, vomiting, diarrhea, constipation.  Review of Systems  Positive: Urinary retention, abdominal pain Negative: Fever, chills, nausea, vomiting  Physical Exam  BP (!) 154/67 (BP Location: Right Arm)   Pulse 98   Temp 98.4 F (36.9 C)   Resp 18   Ht _0  (1.676 m)   Wt 70.8 kg   SpO2 92%   BMI 25.18 kg/m  Gen:   Awake, no distress   Resp:  Normal effort  MSK:   Moves extremities without difficulty  Other:  Minimally tender over the suprapubic region, no rebound, rigidity, guarding  Medical Decision Making  Medically screening exam initiated at 9:59 PM.  Appropriate orders placed.  Sheppard Corsello was informed that the remainder of the evaluation will be completed by another provider, this initial triage assessment does not replace that evaluation, and the importance of remaining in the ED until their evaluation is complete.  Workup initiated   Anselmo Pickler, [place] 06/24/22 2202

## 2022-06-25 NOTE — ED Notes (Signed)
Patient continues to have prolonged urinary retention. Procedure explained to patient. Foley cath placed, urine drained  with procedure 700 cc of clear yellow urine with no sediments. Foley bag placed below bladder on bed frame. Patient resting comfortably.

## 2022-06-29 NOTE — Telephone Encounter (Signed)
ATC patient to determine last Rituxan infusion and review that Actemra will be started in December 2023. Unable to reach and unable to leave VM. Will continue to f/u  Knox Saliva, PharmD, MPH, BCPS, CPP Clinical Pharmacist (Rheumatology and Pulmonology)

## 2022-07-02 ENCOUNTER — Ambulatory Visit: Payer: Medicare Other | Admitting: Urology

## 2022-07-02 ENCOUNTER — Encounter: Payer: Self-pay | Admitting: Urology

## 2022-07-02 VITALS — BP 126/69 | HR 99

## 2022-07-02 DIAGNOSIS — N401 Enlarged prostate with lower urinary tract symptoms: Secondary | ICD-10-CM | POA: Diagnosis not present

## 2022-07-02 DIAGNOSIS — N138 Other obstructive and reflux uropathy: Secondary | ICD-10-CM

## 2022-07-02 DIAGNOSIS — R339 Retention of urine, unspecified: Secondary | ICD-10-CM

## 2022-07-02 MED ORDER — ALFUZOSIN HCL ER 10 MG PO TB24: 10.0000 mg | ORAL_TABLET | Freq: Every day | ORAL | 11 refills | Status: DC

## 2022-07-02 NOTE — Progress Notes (Signed)
Subjective: 1. BPH with urinary obstruction   2. Urinary retention      Consult requested by ER  [name] is a 76 yo male who was seen in the ER on 06/24/22 for a 2 days history of difficulty voiding with small intermittent voids without pain.  He has no dysuria or hematuria.  He has not had prior voiding difficulties, UTI, Stones or GU surgery.  He had a foley placed and was found to have an 845m PVR.  [name] tolerating the foley.  He had a normal Cr and a clear UA.   He was not started on any meds for BPH.   [name] a diabetic but has no neuropathy symptoms.  He had a CT in 2021 and his prostate had a transverse diameter of 4.2cm and no middle lobe.  He had bilateral renal cysts.   ROS:  Review of Systems  Respiratory:  Positive for cough and shortness of breath.   All other systems reviewed and are negative.   Allergies  Allergen Reactions   Bactrim [Sulfamethoxazole-Trimethoprim] Shortness Of Breath    Patient states "got short winded"   Spiriva Respimat [Tiotropium Bromide Monohydrate] Rash   Ofev [Nintedanib]     Past Medical History:  Diagnosis Date   Arthritis    COPD (chronic obstructive pulmonary disease) ([facility]    Diabetes mellitus    Hypertension    Pneumonia    Thyroid disease     Past Surgical History:  Procedure Laterality Date   KNEE ARTHROSCOPY     NO PAST SURGERIES     RIGHT HEART CATH N/A 11/18/2020   Procedure: RIGHT HEART CATH;  Surgeon: BJolaine Artist MD;  [facility];  Service: Cardiovascular;  Laterality: N/A;   RIGHT/LEFT HEART CATH AND CORONARY ANGIOGRAPHY N/A 05/21/2020   Procedure: RIGHT/LEFT HEART CATH AND CORONARY ANGIOGRAPHY;  Surgeon: JMartinique Peter M, MD;  [facility];  Service: Cardiovascular;  Laterality: N/A;    Social History   Socioeconomic History   Marital status: Married    Spouse name: [name]   Number of children: Not on file   Years of education: Not on file   Highest education level: Not  on file  Occupational History   Occupation: retired fOncologistat [employer]   Smoking status: Former    Packs/day: 0.50    Years: 10.00    Total pack years: 5.00    Types: Cigarettes    Quit date: 12/03/1984    Years since quitting: 37.6   Smokeless tobacco: Never   Tobacco comments:    Quit 40 years ago.   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 He reports he is independent with his care needs and transportation to medical appointment. He reports if needed his wife will assist. He reports staying home and likes watching tv.    Social Determinants of Health   Financial Resource Strain: Low Risk  (06/01/2019)   Overall Financial Resource Strain (CARDIA)    Difficulty of Paying Living Expenses: Not hard at all  Food Insecurity: No Food Insecurity (06/01/2019)   Hunger Vital Sign    Worried About Running Out of  Food in the Last Year: Never true    Ran Out of Food in the Last Year: Never true  Transportation Needs: Not on file  Physical Activity: Inactive (06/01/2019)   Exercise Vital Sign    Days of Exercise per Week: 0 days    Minutes of Exercise per Session: 0 min  Stress: No Stress Concern Present (06/01/2019)   [place]    Feeling of Stress : Not at all  Social Connections: Not on file  Intimate Partner Violence: Not on file    Family History  Problem Relation Age of Onset   Diabetes Mother    Stroke Mother     Anti-infectives: Anti-infectives (From admission, onward)    None       Current Outpatient Medications  Medication Sig Dispense Refill   acetaminophen (TYLENOL) 500 MG tablet Take 1,000 mg by mouth every 6 (six) hours as needed for  moderate pain or headache.     albuterol (VENTOLIN HFA) 108 (90 Base) MCG/ACT inhaler Inhale 2 puffs into the lungs every 6 (six) hours as needed.     alfuzosin (UROXATRAL) 10 MG 24 hr tablet Take 1 tablet (10 mg total) by mouth daily with breakfast. 30 tablet 11   amLODipine (NORVASC) 10 MG tablet Take 10 mg by mouth daily.     aspirin 81 MG tablet Take 81 mg by mouth daily.     atorvastatin (LIPITOR) 20 MG tablet Take 20 mg by mouth daily.  2   azaTHIOprine (IMURAN) 50 MG tablet Take 100 mg by mouth daily.      fluticasone (FLONASE) 50 MCG/ACT nasal spray Place 2 sprays into both nostrils daily. 16 g 2   folic acid (FOLVITE) 638 MCG tablet Take 400 mcg by mouth daily.     HYDROcodone bit-homatropine (HYCODAN) 5-1.5 MG/5ML syrup Take 5 mLs by mouth 2 (two) times daily as needed for cough. 240 mL 0   insulin NPH Human (NOVOLIN N) 100 UNIT/ML injection Inject 12 Units into the skin See admin instructions. Inject 10 units every morning and 20 units every evening     Insulin Syringe-Needle U-100 (INSULIN SYRINGE .3CC/31GX5/16") 31G X 5/16" 0.3 ML MISC Inject 1 Syringe into the skin as directed.     levothyroxine (SYNTHROID, LEVOTHROID) 25 MCG tablet Take 25 mcg by mouth daily.     metFORMIN (GLUCOPHAGE-XR) 500 MG 24 hr tablet Take 2 tablets (1,000 mg total) by mouth 2 (two) times daily.     pantoprazole (PROTONIX) 40 MG tablet Take 40 mg by mouth daily.     predniSONE (DELTASONE) 1 MG tablet Take 3 mg by mouth daily.     Treprostinil (TYVASO DPI MAINTENANCE KIT) 48 MCG POWD Inhale 48 mcg into the lungs in the morning, at noon, in the evening, and at bedtime.     umeclidinium bromide (INCRUSE ELLIPTA) 62.5 MCG/ACT AEPB Inhale 1 puff into the lungs daily. 30 each 5   No current facility-administered medications for this visit.     Objective: Vital signs in last 24 hours: BP 126/69   Pulse 99   Intake/Output from previous day: No intake/output data recorded. Intake/Output this  shift: _0 @   Physical Exam Vitals reviewed.  Constitutional:      Appearance: Normal appearance.  Cardiovascular:     Rate and Rhythm: Normal rate and regular rhythm.     Heart sounds: Normal heart sounds.  Pulmonary:     Effort: No respiratory distress.  Breath sounds: No rhonchi or rales.  Abdominal:     General: Abdomen is flat.     Palpations: Abdomen is soft.     Tenderness: There is no abdominal tenderness.     Hernia: No hernia is present.  Genitourinary:    Comments: Uncirc phallus with foley in place. Scrotum has moderate soft right hydrocele with normal testes and epididymis. AP without lesions. NST without mass. Prostate 2+ benign. SV non-palpable.  Musculoskeletal:        General: No swelling or tenderness. Normal range of motion.  Skin:    General: Skin is warm and dry.  Neurological:     General: No focal deficit present.     Mental Status: [name] alert and oriented to person, place, and time.  Psychiatric:        Mood and Affect: Mood normal.        Behavior: Behavior normal.     Lab Results:  No results found for this or any previous visit (from the past 24 hour(s)).  BMET No results for input(s): "NA", "K", "CL", "CO2", "GLUCOSE", "BUN", "CREATININE", "CALCIUM" in the last 72 hours. PT/INR No results for input(s): "LABPROT", "INR" in the last 72 hours. ABG No results for input(s): "PHART", "HCO3" in the last 72 hours.  Invalid input(s): "PCO2", "PO2" Recent Results (from the past 2160 hour(s))  TSH     Status: None   Collection Time: 05/13/22  9:47 AM  Result Value Ref Range   TSH 1.80 0.35 - 5.50 uIU/mL  Comprehensive metabolic panel     Status: None   Collection Time: 05/13/22  9:47 AM  Result Value Ref Range   Sodium 140 135 - 145 mEq/L   Potassium 3.9 3.5 - 5.1 mEq/L   Chloride 104 96 - 112 mEq/L   CO2 31 19 - 32 mEq/L   Glucose, Bld 89 70 - 99 mg/dL   BUN 10 6 - 23 mg/dL   Creatinine, Ser 0.85 0.40 - 1.50 mg/dL   Total  Bilirubin 0.4 0.2 - 1.2 mg/dL   Alkaline Phosphatase 40 39 - 117 U/L   AST 15 0 - 37 U/L   ALT 8 0 - 53 U/L   Total Protein 6.8 6.0 - 8.3 g/dL   Albumin 3.8 3.5 - 5.2 g/dL   GFR 84.07 >60.00 mL/min    Comment: Calculated using the CKD-EPI Creatinine Equation (2021)   Calcium 8.8 8.4 - 10.5 mg/dL  CBC with Differential/Platelet     Status: Abnormal   Collection Time: 05/13/22  9:47 AM  Result Value Ref Range   WBC 5.1 4.0 - 10.5 K/uL   RBC 3.38 (L) 4.22 - 5.81 Mil/uL   Hemoglobin 9.9 (L) 13.0 - 17.0 g/dL   HCT 31.8 (L) 39.0 - 52.0 %   MCV 94.2 78.0 - 100.0 fl   MCHC 31.1 30.0 - 36.0 g/dL   RDW 19.7 (H) 11.5 - 15.5 %   Platelets 252.0 150.0 - 400.0 K/uL   Neutrophils Relative % 75.9 43.0 - 77.0 %   Lymphocytes Relative 9.4 (L) 12.0 - 46.0 %   Monocytes Relative 9.3 3.0 - 12.0 %   Eosinophils Relative 4.7 0.0 - 5.0 %   Basophils Relative 0.7 0.0 - 3.0 %   Neutro Abs 3.9 1.4 - 7.7 K/uL   Lymphs Abs 0.5 (L) 0.7 - 4.0 K/uL   Monocytes Absolute 0.5 0.1 - 1.0 K/uL   Eosinophils Absolute 0.2 0.0 - 0.7 K/uL   Basophils Absolute 0.0  0.0 - 0.1 K/uL  Pulmonary function test     Status: None   Collection Time: 06/18/22 10:10 AM  Result Value Ref Range   FVC-Pre 1.53 L   FVC-%Pred-Pre 43 %   FEV1-Pre 1.39 L   FEV1-%Pred-Pre 55 %   FEV6-Pre 1.53 L   FEV6-%Pred-Pre 47 %   Pre FEV1/FVC ratio 90 %   FEV1FVC-%Pred-Pre 125 %   Pre FEV6/FVC Ratio 100 %   FEV6FVC-%Pred-Pre 107 %   FEF 25-75 Pre 2.32 L/sec   FEF2575-%Pred-Pre 131 %   DLCO unc 5.01 ml/min/mmHg   DLCO unc % pred 22 %   DLCO cor 5.99 ml/min/mmHg   DLCO cor % pred 27 %   DL/VA 1.86 ml/min/mmHg/L   DL/VA % pred 46 %  Urinalysis, Routine w reflex microscopic Urine, Clean Catch     Status: None   Collection Time: 06/24/22  7:41 PM  Result Value Ref Range   Color, Urine YELLOW YELLOW   APPearance CLEAR CLEAR   Specific Gravity, Urine 1.006 1.005 - 1.030   pH 8.0 5.0 - 8.0   Glucose, UA NEGATIVE NEGATIVE mg/dL   Hgb urine  dipstick NEGATIVE NEGATIVE   Bilirubin Urine NEGATIVE NEGATIVE   Ketones, ur NEGATIVE NEGATIVE mg/dL   Protein, ur NEGATIVE NEGATIVE mg/dL   Nitrite NEGATIVE NEGATIVE   Leukocytes,Ua NEGATIVE NEGATIVE    Comment: Performed at [facility], [address] [place], [place] [address]  CBC with Differential     Status: Abnormal   Collection Time: 06/24/22  8:47 PM  Result Value Ref Range   WBC 5.8 4.0 - 10.5 K/uL   RBC 3.76 (L) 4.22 - 5.81 MIL/uL   Hemoglobin 11.1 (L) 13.0 - 17.0 g/dL   HCT 36.1 (L) 39.0 - 52.0 %   MCV 96.0 80.0 - 100.0 fL   MCH 29.5 26.0 - 34.0 pg   MCHC 30.7 30.0 - 36.0 g/dL   RDW 19.1 (H) 11.5 - 15.5 %   Platelets 269 150 - 400 K/uL   nRBC 0.0 0.0 - 0.2 %   Neutrophils Relative % 81 %   Neutro Abs 4.7 1.7 - 7.7 K/uL   Lymphocytes Relative 7 %   Lymphs Abs 0.4 (L) 0.7 - 4.0 K/uL   Monocytes Relative 10 %   Monocytes Absolute 0.6 0.1 - 1.0 K/uL   Eosinophils Relative 1 %   Eosinophils Absolute 0.1 0.0 - 0.5 K/uL   Basophils Relative 1 %   Basophils Absolute 0.0 0.0 - 0.1 K/uL   Immature Granulocytes 0 %   Abs Immature Granulocytes 0.02 0.00 - 0.07 K/uL    Comment: Performed at [facility], [address], [place], [place] [address]  Basic metabolic panel     Status: Abnormal   Collection Time: 06/24/22  8:47 PM  Result Value Ref Range   Sodium 142 135 - 145 mmol/L   Potassium 4.4 3.5 - 5.1 mmol/L   Chloride 102 98 - 111 mmol/L   CO2 27 22 - 32 mmol/L   Glucose, Bld 109 (H) 70 - 99 mg/dL    Comment: Glucose reference range applies only to samples taken after fasting for at least 8 hours.   BUN 9 8 - 23 mg/dL   Creatinine, Ser 0.99 0.61 - 1.24 mg/dL   Calcium 9.0 8.9 - 10.3 mg/dL   GFR, Estimated >60 >60 mL/min    Comment: (NOTE) Calculated using the CKD-EPI Creatinine Equation (2021)    Anion gap 13 5 - 15  Comment: Performed at [facility], [address] [place], [place] [address]    Studies/Results: No results found. I have reviewed his ER  records and labs and his CT films from 2021.   Assessment/Plan: BPH with retention.    I will give him alfuzosin since he has a sulfa allergy and will have him return next week for a voiding trial.  Side effects reviewed.    Meds ordered this encounter  Medications   alfuzosin (UROXATRAL) 10 MG 24 hr tablet    Sig: Take 1 tablet (10 mg total) by mouth daily with breakfast.    Dispense:  30 tablet    Refill:  11     No orders of the defined types were placed in this encounter.    Return for next week with [name] for a voiding trial. .    CC: Dr. Merrilee Seashore.      Irine Seal 07/02/2022 [phone]

## 2022-07-03 DIAGNOSIS — R0902 Hypoxemia: Secondary | ICD-10-CM | POA: Diagnosis not present

## 2022-07-03 NOTE — Telephone Encounter (Signed)
The Rituxan is not being effective. 6 months ois the the average time for b cells to re0emerge thought in some patinets it can take years. And in those patients just choosing 6 month would not make a differenc eiwth immune suppression/ So, hat is the down side of giving actemra now beyong immune suppression esp when he is progressing?

## 2022-07-08 ENCOUNTER — Ambulatory Visit: Payer: Medicare Other | Admitting: Physician Assistant

## 2022-07-08 VITALS — BP 115/67 | HR 94

## 2022-07-08 DIAGNOSIS — R339 Retention of urine, unspecified: Secondary | ICD-10-CM

## 2022-07-08 NOTE — Progress Notes (Signed)
Assessment: 1. Urinary retention - Bladder Voiding Trial - Foley catheter - discontinue    Plan: Pt passed voiding trial in office and will FU this afternoon if he has voiding issues today. ED precautions discussed. Continue alfuzosin and FU in 1 month for PVR and UA.   Chief Complaint: No chief complaint on file.  HPI: [name] is a 76 y.o. male who presents for continued evaluation of BPH with acute retention. He has been on alfuzosin since last visit and tolerating well. No pain. No gross hematuria. Voiding trial scheduled for today.   07/02/22 [name] is a 76 yo male who was seen in the ER on 06/24/22 for a 2 days history of difficulty voiding with small intermittent voids without pain.  He has no dysuria or hematuria.  He has not had prior voiding difficulties, UTI, Stones or GU surgery.  He had a foley placed and was found to have an 858m PVR.  [name] tolerating the foley.  He had a normal Cr and a clear UA.   He was not started on any meds for BPH.   [name] a diabetic but has no neuropathy symptoms.  He had a CT in 2021 and his prostate had a transverse diameter of 4.2cm and no middle lobe.  He had bilateral renal cysts.   Portions of the above documentation were copied from a prior visit for review purposes only.  Allergies: Allergies  Allergen Reactions   Bactrim [Sulfamethoxazole-Trimethoprim] Shortness Of Breath    Patient states "got short winded"   Spiriva Respimat [Tiotropium Bromide Monohydrate] Rash   Ofev [Nintedanib]     PMH: Past Medical History:  Diagnosis Date   Arthritis    COPD (chronic obstructive pulmonary disease) (HCC)    Diabetes mellitus    Hypertension    Pneumonia    Thyroid disease     PSH: Past Surgical History:  Procedure Laterality Date   KNEE ARTHROSCOPY     NO PAST SURGERIES     RIGHT HEART CATH N/A 11/18/2020   Procedure: RIGHT HEART CATH;  Surgeon: BJolaine Artist MD;  [facility];  Service:  Cardiovascular;  Laterality: N/A;   RIGHT/LEFT HEART CATH AND CORONARY ANGIOGRAPHY N/A 05/21/2020   Procedure: RIGHT/LEFT HEART CATH AND CORONARY ANGIOGRAPHY;  Surgeon: JMartinique Peter M, MD;  [facility];  Service: Cardiovascular;  Laterality: N/A;    SH: Social History   Tobacco Use   Smoking status: Former    Packs/day: 0.50    Years: 10.00    Total pack years: 5.00    Types: Cigarettes    Quit date: 12/03/1984    Years since quitting: 37.6   Smokeless tobacco: Never   Tobacco comments:    Quit 40 years ago.   Substance Use Topics   Alcohol use: No   Drug use: No    ROS: All other review of systems were reviewed and are negative except what is noted above in HPI  PE: BP 115/67   Pulse 94  GENERAL APPEARANCE:  Well developed, well nourished, on [place] O2 HEENT:  Atraumatic, normocephalic NECK:  Supple. Trachea midline ABDOMEN:  Soft, non-tender, no masses EXTREMITIES:  Moves all extremities well NEUROLOGIC:  Alert and oriented x 3 MENTAL STATUS:  appropriate BACK:  Non-tender to palpation, No CVAT SKIN:  Warm, dry, and intact   Results: Laboratory Data: Lab Results  Component Value Date   WBC 5.8 06/24/2022   HGB 11.1 (L)  06/24/2022   HCT 36.1 (L) 06/24/2022   MCV 96.0 06/24/2022   PLT 269 06/24/2022    Lab Results  Component Value Date   CREATININE 0.99 06/24/2022     Lab Results  Component Value Date   HGBA1C 7.1 (H) 11/14/2020    Urinalysis    Component Value Date/Time   COLORURINE YELLOW 06/24/2022 1941   APPEARANCEUR CLEAR 06/24/2022 1941   LABSPEC 1.006 06/24/2022 1941   PHURINE 8.0 06/24/2022 1941   GLUCOSEU NEGATIVE 06/24/2022 1941   HGBUR NEGATIVE 06/24/2022 1941   [place] NEGATIVE 06/24/2022 1941   KETONESUR NEGATIVE 06/24/2022 1941   PROTEINUR NEGATIVE 06/24/2022 1941   NITRITE NEGATIVE 06/24/2022 1941   LEUKOCYTESUR NEGATIVE 06/24/2022 1941    Lab Results  Component Value Date   BACTERIA NONE SEEN 11/14/2020     Pertinent Imaging: No results found for this or any previous visit.  No results found for this or any previous visit.  No results found for this or any previous visit.  No results found for this or any previous visit.  No results found for this or any previous visit.  No results found for this or any previous visit.  No results found for this or any previous visit.  No results found for this or any previous visit.  No results found for this or any previous visit (from the past 24 hour(s)).

## 2022-07-08 NOTE — Progress Notes (Signed)
Fill and Pull Catheter Removal  Patient is present today for a catheter removal.  Patient was cleaned and prepped in a sterile fashion 200 ml of sterile water/ saline was instilled into the bladder when the patient felt the urge to urinate. 10 ml of water was then drained from the balloon.  A 16 FR foley cath was removed from the bladder no complications were noted .  Patient as then given some time to void on their own.  Patient can void  100 ml on their own after some time.  Patient tolerated well.  Performed by: Marisue Brooklyn, CMA  Follow up/ Additional notes: Follow up as scheduled

## 2022-07-11 ENCOUNTER — Other Ambulatory Visit: Payer: Self-pay

## 2022-07-11 ENCOUNTER — Encounter (HOSPITAL_COMMUNITY): Payer: Self-pay | Admitting: Emergency Medicine

## 2022-07-11 ENCOUNTER — Emergency Department (HOSPITAL_COMMUNITY)
Admission: EM | Admit: 2022-07-11 | Discharge: 2022-07-11 | Disposition: A | Discharge status: Home / Self Care | Payer: Medicare Other | Attending: Emergency Medicine | Admitting: Emergency Medicine

## 2022-07-11 DIAGNOSIS — R339 Retention of urine, unspecified: Secondary | ICD-10-CM | POA: Insufficient documentation

## 2022-07-11 DIAGNOSIS — Z87891 Personal history of nicotine dependence: Secondary | ICD-10-CM | POA: Insufficient documentation

## 2022-07-11 DIAGNOSIS — J449 Chronic obstructive pulmonary disease, unspecified: Secondary | ICD-10-CM | POA: Diagnosis not present

## 2022-07-11 DIAGNOSIS — E119 Type 2 diabetes mellitus without complications: Secondary | ICD-10-CM | POA: Diagnosis not present

## 2022-07-11 DIAGNOSIS — I10 Essential (primary) hypertension: Secondary | ICD-10-CM | POA: Diagnosis not present

## 2022-07-11 DIAGNOSIS — R39198 Other difficulties with micturition: Secondary | ICD-10-CM | POA: Diagnosis not present

## 2022-07-11 DIAGNOSIS — Z955 Presence of coronary angioplasty implant and graft: Secondary | ICD-10-CM | POA: Diagnosis not present

## 2022-07-11 NOTE — ED Triage Notes (Signed)
Pt here with c/o urinary retention x 1 hr. States he had a catheter placed recently for the same thing and had it removed.

## 2022-07-11 NOTE — ED Notes (Signed)
Pt's O2 sats on R/A in triage were 70%. Pt states he wears O2 _0  Elim at all tim,es but did not bring his oxygen in with him (left it in car). Pt placed on O2 at 4L  and sats improving. NAD noted other than dyspnea on exertion.

## 2022-07-11 NOTE — ED Provider Notes (Signed)
Patient signed out to me at 0700 by Dr. Sedonia Small pending trial of void.  In short this is a 76 year old male with a history of COPD, diabetes and BPH presenting to the emergency department with concern for urinary retention.  Patient did have urinary retention recently and had the Foley removed at urology.  He states this morning he tried to get up and urinate and was unsuccessful.  The patient otherwise has no acute complaints.  Upon my evaluation, he is awake and alert resting in bed comfortably no acute distress.  He denies any abdominal pain.  Abdomen is soft and nontender.  He has approximately 100 cc of urine in his urinal.  He did have about 350 cc on his bladder scan.  Patient will be given a second attempt to urinate and if he is continuing to be successful he will likely be able to be discharged without Foley catheter.   Ottie Glazier, DO 07/11/22 [phone]

## 2022-07-11 NOTE — ED Provider Notes (Signed)
[facility] Emergency Department Provider Note MRN:  [id number]  Arrival date & time: 07/11/22     Chief Complaint   Urinary Retention   History of Present Illness   [name] is a 76 y.o. year-old male with a history of COPD, diabetes presenting to the ED with chief complaint of urinary retention.  Tried to be an hour ago when not much came out.  Recent history of urinary retention that required a catheter placement.  The catheter was removed about a week ago and he has been urinating without issue up until this evening.  No pain in the abdomen or pressure, no fever, no other complaints.  Review of Systems  A thorough review of systems was obtained and all systems are negative except as noted in the HPI and PMH.   Patient's Health History    Past Medical History:  Diagnosis Date   Arthritis    COPD (chronic obstructive pulmonary disease) ([place])    Diabetes mellitus    Hypertension    Pneumonia    Thyroid disease     Past Surgical History:  Procedure Laterality Date   KNEE ARTHROSCOPY     NO PAST SURGERIES     RIGHT HEART CATH N/A 11/18/2020   Procedure: RIGHT HEART CATH;  Surgeon: Jolaine Artist, MD;  Location: [facility];  Service: Cardiovascular;  Laterality: N/A;   RIGHT/LEFT HEART CATH AND CORONARY ANGIOGRAPHY N/A 05/21/2020   Procedure: RIGHT/LEFT HEART CATH AND CORONARY ANGIOGRAPHY;  Surgeon: Martinique, Peter M, MD;  Location: [facility];  Service: Cardiovascular;  Laterality: N/A;    Family History  Problem Relation Age of Onset   Diabetes Mother    Stroke Mother     Social History   Socioeconomic History   Marital status: Married    Spouse name: [name]   Number of children: Not on file   Years of education: Not on file   Highest education level: Not on file  Occupational History   Occupation: retired Oncologist at [employer]   Smoking status: Former    Packs/day: 0.50    Years: 10.00    Total pack  years: 5.00    Types: Cigarettes    Quit date: 12/03/1984    Years since quitting: 37.6   Smokeless tobacco: Never   Tobacco comments:    Quit 40 years ago.   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 He reports he is independent with his care needs and transportation to medical appointment. He reports if needed his wife will assist. He reports staying home and likes watching tv.    Social Determinants of Health   Financial Resource Strain: Low Risk  (06/01/2019)   Overall Financial Resource Strain (CARDIA)    Difficulty of Paying Living Expenses: Not hard at all  Food Insecurity: No Food Insecurity (06/01/2019)   Hunger Vital Sign    Worried About Running Out of Food in the Last Year: Never true    Ran Out of Food in the Last Year: Never true  Transportation Needs: Not on file  Physical Activity: Inactive (06/01/2019)   Exercise Vital Sign    Days of Exercise  per Week: 0 days    Minutes of Exercise per Session: 0 min  Stress: No Stress Concern Present (06/01/2019)   [place]    Feeling of Stress : Not at all  Social Connections: Not on file  Intimate Partner Violence: Not on file     Physical Exam   Vitals:   07/11/22 0549 07/11/22 0600  BP:  115/64  Pulse:  91  Resp:  17  Temp:    SpO2: 98% 100%    CONSTITUTIONAL: Well-appearing, NAD NEURO/PSYCH:  Alert and oriented x 3, no focal deficits EYES:  eyes equal and reactive ENT/NECK:  no LAD, no JVD CARDIO: Regular rate, well-perfused, normal S1 and S2 PULM:  CTAB no wheezing or rhonchi GI/GU:  non-distended, non-tender MSK/SPINE:  No gross deformities, no edema SKIN:  no rash, atraumatic   *Additional and/or  pertinent findings included in MDM below  Diagnostic and Interventional Summary    EKG Interpretation  Date/Time:    Ventricular Rate:    PR Interval:    QRS Duration:   QT Interval:    QTC Calculation:   R Axis:     Text Interpretation:         Labs Reviewed  URINE CULTURE    No orders to display    Medications - No data to display   Procedures  /  Critical Care Procedures  ED Course and Medical Decision Making  Initial Impression and Ddx Question recurrent urinary retention, reportedly has a history of BPH.  Awaiting bladder scan, will consider placement of catheter.  Past medical/surgical history that increases complexity of ED encounter: History of BPH  Interpretation of Diagnostics 300 cc urine in the bladder on bedside ultrasound.  Patient Reassessment and Ultimate Disposition/Management     Given the short duration of inability to urinate and relatively small amount of urine in the bladder, will give patient some time in the emergency department to try to urinate on his own.  If unsuccessful over the next hour or 2, will simply need Foley catheter and urology follow-up.  Signed out to oncoming provider.  Patient management required discussion with the following services or consulting groups:  None  Complexity of Problems Addressed Acute illness or injury that poses threat of life of bodily function  Additional Data Reviewed and Analyzed Further history obtained from: Past medical history and medications listed in the EMR and Prior ED visit notes  Additional Factors Impacting ED Encounter Risk None  Barth Kirks. Sedonia Small, [place] [email or web address]  Final Clinical Impressions(s) / ED Diagnoses     ICD-10-CM   1. Difficulty urinating  R39.198       ED Discharge Orders     None        Discharge Instructions Discussed with and Provided to Patient:   Discharge Instructions   None       Maudie Flakes, MD 07/11/22 0700

## 2022-07-11 NOTE — Discharge Instructions (Addendum)
You were seen in the emergency department for your concern for difficulty with urination.  You were able to urinate here and we did not need to replace the Foley catheter.  It is common with enlarged prostate do not always completely empty your bladder each time you urinate and it may take a few extra seconds for your stream to start compared to normal.  You should continue to take your medication as prescribed by urology that helps with urination and follow-up with them as planned.  You should return to the emergency department if has been several hours and you are unable to make any urine, you are having abdominal pain, you have fevers, or if you have any other new or concerning symptoms.

## 2022-07-13 ENCOUNTER — Emergency Department (HOSPITAL_COMMUNITY)
Admission: EM | Admit: 2022-07-13 | Discharge: 2022-07-13 | Disposition: A | Discharge status: Home / Self Care | Payer: Medicare Other | Attending: Emergency Medicine | Admitting: Emergency Medicine

## 2022-07-13 ENCOUNTER — Ambulatory Visit: Payer: Medicare Other | Admitting: Physician Assistant

## 2022-07-13 ENCOUNTER — Encounter (HOSPITAL_COMMUNITY): Payer: Self-pay | Admitting: Emergency Medicine

## 2022-07-13 ENCOUNTER — Other Ambulatory Visit: Payer: Self-pay

## 2022-07-13 VITALS — BP 115/70 | HR 90 | Temp 97.9°F

## 2022-07-13 DIAGNOSIS — Z7982 Long term (current) use of aspirin: Secondary | ICD-10-CM | POA: Insufficient documentation

## 2022-07-13 DIAGNOSIS — Z79899 Other long term (current) drug therapy: Secondary | ICD-10-CM | POA: Diagnosis not present

## 2022-07-13 DIAGNOSIS — R339 Retention of urine, unspecified: Secondary | ICD-10-CM | POA: Diagnosis not present

## 2022-07-13 DIAGNOSIS — I959 Hypotension, unspecified: Secondary | ICD-10-CM | POA: Insufficient documentation

## 2022-07-13 DIAGNOSIS — R300 Dysuria: Secondary | ICD-10-CM

## 2022-07-13 DIAGNOSIS — J449 Chronic obstructive pulmonary disease, unspecified: Secondary | ICD-10-CM | POA: Insufficient documentation

## 2022-07-13 DIAGNOSIS — N138 Other obstructive and reflux uropathy: Secondary | ICD-10-CM | POA: Diagnosis not present

## 2022-07-13 DIAGNOSIS — N3000 Acute cystitis without hematuria: Secondary | ICD-10-CM | POA: Diagnosis not present

## 2022-07-13 DIAGNOSIS — R42 Dizziness and giddiness: Secondary | ICD-10-CM | POA: Diagnosis present

## 2022-07-13 DIAGNOSIS — N390 Urinary tract infection, site not specified: Secondary | ICD-10-CM | POA: Diagnosis not present

## 2022-07-13 DIAGNOSIS — Z794 Long term (current) use of insulin: Secondary | ICD-10-CM | POA: Insufficient documentation

## 2022-07-13 DIAGNOSIS — N401 Enlarged prostate with lower urinary tract symptoms: Secondary | ICD-10-CM

## 2022-07-13 LAB — BASIC METABOLIC PANEL
Anion gap: 11 (ref 5–15)
BUN: 17 mg/dL (ref 8–23)
CO2: 26 mmol/L (ref 22–32)
Calcium: 8.8 mg/dL — ABNORMAL LOW (ref 8.9–10.3)
Chloride: 103 mmol/L (ref 98–111)
Creatinine, Ser: 1.16 mg/dL (ref 0.61–1.24)
GFR, Estimated: >60 mL/min (ref >60)
Glucose, Bld: 136 mg/dL — ABNORMAL HIGH (ref 70–99)
Potassium: 4.6 mmol/L (ref 3.5–5.1)
Sodium: 140 mmol/L (ref 135–145)

## 2022-07-13 LAB — URINALYSIS, ROUTINE W REFLEX MICROSCOPIC
Bilirubin, UA: NEGATIVE
Glucose, UA: NEGATIVE
Ketones, UA: NEGATIVE
Nitrite, UA: POSITIVE — AB
Protein,UA: NEGATIVE
Specific Gravity, UA: 1.01 (ref 1.005–1.030)
Urobilinogen, Ur: 0.2 mg/dL (ref 0.2–1.0)
pH, UA: 6 (ref 5.0–7.5)

## 2022-07-13 LAB — CBG MONITORING, ED: Glucose-Capillary: 106 mg/dL — ABNORMAL HIGH (ref 70–99)

## 2022-07-13 LAB — CBC
HCT: 35 % — ABNORMAL LOW (ref 39.0–52.0)
Hemoglobin: 10.4 g/dL — ABNORMAL LOW (ref 13.0–17.0)
MCH: 29.1 pg (ref 26.0–34.0)
MCHC: 29.7 g/dL — ABNORMAL LOW (ref 30.0–36.0)
MCV: 97.8 fL (ref 80.0–100.0)
Platelets: 231 10*3/uL (ref 150–400)
RBC: 3.58 MIL/uL — ABNORMAL LOW (ref 4.22–5.81)
RDW: 18.6 % — ABNORMAL HIGH (ref 11.5–15.5)
WBC: 9.1 10*3/uL (ref 4.0–10.5)
nRBC: 0 % (ref 0.0–0.2)

## 2022-07-13 LAB — TROPONIN I (HIGH SENSITIVITY)
Troponin I (High Sensitivity): 8 ng/L (ref <18)
Troponin I (High Sensitivity): 8 ng/L (ref <18)

## 2022-07-13 LAB — MICROSCOPIC EXAMINATION
Renal Epithel, UA: NONE SEEN /hpf
WBC, UA: >30 /hpf — AB (ref 0–5)

## 2022-07-13 LAB — BLADDER SCAN AMB NON-IMAGING: Scan Result: >465

## 2022-07-13 MED ORDER — CIPROFLOXACIN HCL 500 MG PO TABS: 500.0000 mg | ORAL_TABLET | Freq: Two times a day (BID) | ORAL | 0 refills | Status: DC

## 2022-07-13 MED ORDER — CIPROFLOXACIN HCL 250 MG PO TABS
500.0000 mg | ORAL_TABLET | Freq: Once | ORAL | Status: AC
  Administered 2022-07-13: 500 mg via ORAL
  Filled 2022-07-13: qty 2

## 2022-07-13 NOTE — ED Provider Notes (Signed)
[facility] Provider Note   CSN: [id number] Arrival date & time: 07/13/22  1258     History {Add pertinent medical, surgical, social history, OB history to HPI:1} Chief Complaint  Patient presents with   Dizziness    [name] is a 76 y.o. male presented to ED with complaint of hypotension.  The patient reports that he was at the urology office today and there was concern his blood pressure was lower than normal.  He was sent to the ED for evaluation.  He says he had some lightheadedness earlier but it is completely resolved.  He has episodes near daily where he has occasional lightheadedness, but they tend to go away.  [name] asymptomatic now.  He was seen in the ED and had a Foley catheter placed 2 days ago, per my review of the records.  His urine culture was growing Pseudomonas that was pansensitive.  Per my review of external records he was seen in the urology office today and was prescribed ciprofloxacin for this UTI but has not started it yet.  Prescription was sent to his pharmacy at [facility] Prior to Admission medications   Medication Sig Start Date End Date Taking? Authorizing Provider  acetaminophen (TYLENOL) 500 MG tablet Take 1,000 mg by mouth every 6 (six) hours as needed for moderate pain or headache.    [provider]  albuterol (VENTOLIN HFA) 108 (90 Base) MCG/ACT inhaler Inhale 2 puffs into the lungs every 6 (six) hours as needed. 11/22/17   [provider]  alfuzosin (UROXATRAL) 10 MG 24 hr tablet Take 1 tablet (10 mg total) by mouth daily with breakfast. 07/02/22   Irine Seal, MD  amLODipine (NORVASC) 10 MG tablet Take 10 mg by mouth daily.    [provider]  aspirin 81 MG tablet Take 81 mg by mouth daily.    [provider]  atorvastatin (LIPITOR) 20 MG tablet Take 20 mg by mouth daily. 08/19/18   [provider]  azaTHIOprine (IMURAN) 50 MG tablet Take 100 mg by mouth  daily.  04/13/19   [provider]  ciprofloxacin (CIPRO) 500 MG tablet Take 1 tablet (500 mg total) by mouth every 12 (twelve) hours. 07/13/22   [place], Berneice Heinrich, PA-C  fluticasone (FLONASE) 50 MCG/ACT nasal spray Place 2 sprays into both nostrils daily. 11/08/20   Brand Males, MD  folic acid (FOLVITE) 440 MCG tablet Take 400 mcg by mouth daily.    [provider]  HYDROcodone bit-homatropine (HYCODAN) 5-1.5 MG/5ML syrup Take 5 mLs by mouth 2 (two) times daily as needed for cough. 12/15/21   Brand Males, MD  insulin NPH Human (NOVOLIN N) 100 UNIT/ML injection Inject 12 Units into the skin See admin instructions. Inject 10 units every morning and 20 units every evening    [provider]  Insulin Syringe-Needle U-100 (INSULIN SYRINGE .3CC/31GX5/16") 31G X 5/16" 0.3 ML MISC Inject 1 Syringe into the skin as directed. 02/27/20   [provider]  levothyroxine (SYNTHROID, LEVOTHROID) 25 MCG tablet Take 25 mcg by mouth daily.    [provider]  metFORMIN (GLUCOPHAGE-XR) 500 MG 24 hr tablet Take 2 tablets (1,000 mg total) by mouth 2 (two) times daily. 05/24/20   Martinique, Peter M, MD  pantoprazole (PROTONIX) 40 MG tablet Take 40 mg by mouth daily. 01/14/20   [provider]  predniSONE (DELTASONE) 1 MG tablet Take 3 mg by mouth daily. 02/12/21  [provider]  Treprostinil (TYVASO DPI MAINTENANCE KIT) 48 MCG POWD Inhale 48 mcg into the lungs in the morning, at noon, in the evening, and at bedtime.    [provider]  umeclidinium bromide (INCRUSE ELLIPTA) 62.5 MCG/ACT AEPB Inhale 1 puff into the lungs daily. 02/24/22   Brand Males, MD      Allergies    Bactrim [sulfamethoxazole-trimethoprim], Spiriva respimat [tiotropium bromide monohydrate], and Ofev [nintedanib]    Review of Systems   Review of Systems  Physical Exam Updated Vital Signs BP (!) 104/56 (BP Location: Left Arm)   Pulse 85   Resp 18   SpO2  91%  Physical Exam Constitutional:      General: [name] not in acute distress. HENT:     Head: Normocephalic and atraumatic.  Eyes:     Conjunctiva/sclera: Conjunctivae normal.     Pupils: Pupils are equal, round, and reactive to light.  Cardiovascular:     Rate and Rhythm: Normal rate and regular rhythm.  Pulmonary:     Effort: Pulmonary effort is normal. No respiratory distress.     Comments: On 4l [place] Abdominal:     General: There is no distension.     Tenderness: There is no abdominal tenderness.  Genitourinary:    Comments: Foley catheter in place Skin:    General: Skin is warm and dry.  Neurological:     General: No focal deficit present.     Mental Status: [name] alert. Mental status is at baseline.  Psychiatric:        Mood and Affect: Mood normal.        Behavior: Behavior normal.     ED Results / Procedures / Treatments   Labs (all labs ordered are listed, but only abnormal results are displayed) Labs Reviewed  BASIC METABOLIC PANEL - Abnormal; Notable for the following components:      Result Value   Glucose, Bld 136 (*)    Calcium 8.8 (*)    All other components within normal limits  CBC - Abnormal; Notable for the following components:   RBC 3.58 (*)    Hemoglobin 10.4 (*)    HCT 35.0 (*)    MCHC 29.7 (*)    RDW 18.6 (*)    All other components within normal limits  CBG MONITORING, ED - Abnormal; Notable for the following components:   Glucose-Capillary 106 (*)    All other components within normal limits  SARS CORONAVIRUS 2 BY RT PCR  URINALYSIS, ROUTINE W REFLEX MICROSCOPIC  TROPONIN I (HIGH SENSITIVITY)  TROPONIN I (HIGH SENSITIVITY)    EKG EKG Interpretation  Date/Time:  Monday July 13 2022 14:01:21 EDT Ventricular Rate:  82 PR Interval:  148 QRS Duration: 90 QT Interval:  388 QTC Calculation: 453 R Axis:   151 Text Interpretation: Normal sinus rhythm Right axis deviation Compared to prior tracing borderling T wave inversions lead  V2-V3 are new, no STEMI When compared with ECG of 14-Nov-2020 12:31, PREVIOUS ECG IS PRESENT Confirmed by Octaviano Glow [phone]) on 07/13/2022 3:38:34 PM  Radiology No results found.  Procedures Procedures  {Document cardiac monitor, telemetry assessment procedure when appropriate:1}  Medications Ordered in ED Medications  ciprofloxacin (CIPRO) tablet 500 mg (has no administration in time range)    ED Course/ Medical Decision Making/ A&P                           Medical Decision Making Amount and/or  Complexity of Data Reviewed Labs: ordered.  Risk Prescription drug management.   Patient is here presenting with concern for transient hypotension in the office today.  Patient was observed nearly 5 hours between his wait time and subsequently in his ED room, his blood pressure is gradually rising, and a repeat checks is now but 403 systolic.  He denies any lightheadedness or dizziness during his entire stay in the ED.  [name] asymptomatic.  He does have chronic COPD and wears 4 L nasal cannula at baseline.  I personally reviewed and interpreted the patient's work-up today which showed no acute emergencies.  No acute anemia or evidence of infection.  EKG per my rhythm shows sinus rhythm.  [name] not having any chest pain or discomfort or elevated troponin to suggest ACS.  Low suspicion for pulmonary embolism.  Doubt sepsis or anemia.  I reviewed external work-up including urology office evaluation, urine culture result.  Because it is evening we will give him a ciprofloxacin dose here in the ED.  He has not yet started this antibiotic.  We will pick up the remaining prescription from the pharmacy tomorrow.  Otherwise he is stable for discharge   {Document critical care time when appropriate:1} {Document review of labs and clinical decision tools ie heart score, Chads2Vasc2 etc:1}  {Document your independent review of radiology images, and any outside records:1} {Document your  discussion with family members, caretakers, and with consultants:1} {Document social determinants of health affecting pt's care:1} {Document your decision making why or why not admission, treatments were needed:1} Final Clinical Impression(s) / ED Diagnoses Final diagnoses:  None    Rx / DC Orders ED Discharge Orders     None

## 2022-07-13 NOTE — Progress Notes (Signed)
Assessment: 1. Urinary retention  2. Burning with urination - Urinalysis, Routine w reflex microscopic - BLADDER SCAN AMB NON-IMAGING - Urine Culture  3. Acute cystitis without hematuria - Urine Culture  4. BPH with urinary obstruction  5. Hypotension, unspecified hypotension type    Plan: Foley catheter replaced today and discussed care and implications of obstruction and retention at length with the patient.  Culture reviewed from emergency department visit 2 days ago indicates Pseudomonas and Enterococcus with Cipro being the only p.o. medication showing sensitivity to pseudomonas. Remainder of sensitivities pending. Will adjust tx if indicated. Consider Fosfomycin.  Urine sent for culture again today and prescription for Cipro given.  Side effects precautions discussed at length.  Strict ED precautions also reviewed.  Follow-up in 7 to 10 days for reattempt at voiding trial.   VS were rechecked before pt leaving the office and he became dizzy and c/o feeling "light headed". Pt immediately placed in supine position and monitored. BP recheck 59/40-lying down, HR 68. Pt checked his glucose with his personal monitor = 138. EMS called and pt monitored at all times until their arrival. He remained lucid and alert until transport arrived and left the office without further incident.   Chief Complaint: No chief complaint on file.   HPI: [name] is a 76 y.o. male who presents for continued evaluation of urinary retention with c/o burning and frequency. Pt was seen in ED 2 days ago and noted to have 393m on bladder scan after voiding 1036m Pt refused replacement of foley.  Urine was cultured.  No antibiotics given. Pt had successful voiding trial last week and continues alfuzosin. No gross hematuria. No fever, chills, back pain.  Creatinine 0.99 on 06/24/2022  Cx obtained in ED on 9/9 positive for > 100K colonies of Pseudomonas and Enterococcus.  Preliminary sensitivities indicate  the only p.o. option is Cipro.   UA= >30 WBCs, many bacteria, nitrite positive PVR= >46540m 07/08/22 LarPurcell [name] a 76 37o. male who presents for continued evaluation of BPH with acute retention. He has been on alfuzosin since last visit and tolerating well. No pain. No gross hematuria. Voiding trial scheduled for today.     07/02/22 [name] is a 76 18 male who was seen in the ER on 06/24/22 for a 2 days history of difficulty voiding with small intermittent voids without pain.  He has no dysuria or hematuria.  He has not had prior voiding difficulties, UTI, Stones or GU surgery.  He had a foley placed and was found to have an 800m41mR.  [name] tolerating the foley.  He had a normal Cr and a clear UA.   He was not started on any meds for BPH.   [name] a diabetic but has no neuropathy symptoms.  He had a CT in 2021 and his prostate had a transverse diameter of 4.2cm and no middle lobe.  He had bilateral renal cysts.   Portions of the above documentation were copied from a prior visit for review purposes only.  Allergies: Allergies  Allergen Reactions   Bactrim [Sulfamethoxazole-Trimethoprim] Shortness Of Breath    Patient states "got short winded"   Spiriva Respimat [Tiotropium Bromide Monohydrate] Rash   Ofev [Nintedanib]     PMH: Past Medical History:  Diagnosis Date   Arthritis    COPD (chronic obstructive pulmonary disease) (HCC)[place] Diabetes mellitus    Hypertension    Pneumonia    Thyroid disease  PSH: Past Surgical History:  Procedure Laterality Date   KNEE ARTHROSCOPY     NO PAST SURGERIES     RIGHT HEART CATH N/A 11/18/2020   Procedure: RIGHT HEART CATH;  Surgeon: Jolaine Artist, MD;  Location: [facility];  Service: Cardiovascular;  Laterality: N/A;   RIGHT/LEFT HEART CATH AND CORONARY ANGIOGRAPHY N/A 05/21/2020   Procedure: RIGHT/LEFT HEART CATH AND CORONARY ANGIOGRAPHY;  Surgeon: Martinique, Peter M, MD;  Location: [facility];  Service:  Cardiovascular;  Laterality: N/A;    SH: Social History   Tobacco Use   Smoking status: Former    Packs/day: 0.50    Years: 10.00    Total pack years: 5.00    Types: Cigarettes    Quit date: 12/03/1984    Years since quitting: 37.6   Smokeless tobacco: Never   Tobacco comments:    Quit 40 years ago.   Substance Use Topics   Alcohol use: No   Drug use: No    ROS: All other review of systems were reviewed and positives are noted above in HPI  PE: BP (!) 99/50   Pulse (!) 110  GENERAL APPEARANCE:  Chronically ill appearing, well developed, in NAD, O2 [place] intact HEENT:  Atraumatic, normocephalic NECK:  Supple. Trachea midline ABDOMEN:  Soft, mildly tender, no masses EXTREMITIES:  Moves all extremities well NEUROLOGIC:  Alert and oriented x 3 MENTAL STATUS:  appropriate BACK:  Non-tender to palpation, No CVAT SKIN:  Warm, dry, and intact   Results: Laboratory Data: Lab Results  Component Value Date   WBC 5.8 06/24/2022   HGB 11.1 (L) 06/24/2022   HCT 36.1 (L) 06/24/2022   MCV 96.0 06/24/2022   PLT 269 06/24/2022    Lab Results  Component Value Date   CREATININE 0.99 06/24/2022     Lab Results  Component Value Date   HGBA1C 7.1 (H) 11/14/2020    Urinalysis    Component Value Date/Time   COLORURINE YELLOW 06/24/2022 1941   APPEARANCEUR CLEAR 06/24/2022 1941   LABSPEC 1.006 06/24/2022 1941   PHURINE 8.0 06/24/2022 1941   GLUCOSEU NEGATIVE 06/24/2022 1941   HGBUR NEGATIVE 06/24/2022 1941   BILIRUBINUR NEGATIVE 06/24/2022 1941   KETONESUR NEGATIVE 06/24/2022 1941   PROTEINUR NEGATIVE 06/24/2022 1941   NITRITE NEGATIVE 06/24/2022 1941   LEUKOCYTESUR NEGATIVE 06/24/2022 1941    Lab Results  Component Value Date   BACTERIA NONE SEEN 11/14/2020    Pertinent Imaging: No results found for this or any previous visit.  No results found for this or any previous visit.  No results found for this or any previous visit.  No results found for this or any  previous visit.  No results found for this or any previous visit.  No results found for this or any previous visit.  No results found for this or any previous visit.  No results found for this or any previous visit.  No results found for this or any previous visit (from the past 24 hour(s)).

## 2022-07-13 NOTE — Discharge Instructions (Signed)
You were diagnosed with a urine infection in the ER 2 days ago.  Please go to the [facility] and pick up the prescription tomorrow morning.  You were given your antibiotic in the ER tonight.  Please take the full course of antibiotics at home to treat your urine infection.  Continue drinking a lot of water at home to keep yourself hydrated

## 2022-07-13 NOTE — ED Notes (Signed)
Patient alert & oriented X3 and ambulated from stretcher to wheelchair.

## 2022-07-13 NOTE — ED Triage Notes (Addendum)
States was at dr office and bp was low and came here. A/o. Color wnl. Has foley cath in place and chronic 02 at 4L [place].  Pt c/o dizziness earlier but not now. Pt moving all extremities. Smile symmetrical.

## 2022-07-13 NOTE — Progress Notes (Signed)
Pt here today for bladder scan. Bladder was scanned and >465 was visualized.   Performed by Marisue Brooklyn, CMA  Additional follow up Follow up as scheduled   Simple Catheter Placement  Due to urinary retention patient is present today for a foley cath placement.  Patient was cleaned and prepped in a sterile fashion with betadine A 16 FR foley catheter was inserted, urine return was noted  375 ml, urine was yellow in color.  The balloon was filled with 10cc of sterile water.  A leg bag was attached for drainage. Patient was also given a night bag to take home and was given instruction on how to change from one bag to another.  Patient was given instruction on proper catheter care.  Patient tolerated well, no complications were noted   Performed by: Marisue Brooklyn, CMA  Additional notes/ Follow up: Follow up as scheduled

## 2022-07-14 ENCOUNTER — Telehealth: Payer: Self-pay

## 2022-07-14 NOTE — Telephone Encounter (Signed)
Patient states that since having the catheter placed there was blood draining in the bag.  The blood has since cleared and denies having any other symptoms or trouble with the catheter draining.  I informed him that some blood can be normal after placing the catheter and as long as it has cleared and is draining ok to f/u as scheduled on 09/21 for a voiding trial.  Patient also adivsed to call back if he notices more blood or the catheter is not draining.  Patient voiced understanding and will f/u as scheduled.

## 2022-07-15 ENCOUNTER — Telehealth: Payer: Self-pay

## 2022-07-15 LAB — URINE CULTURE: Culture: >=100000 — AB

## 2022-07-15 NOTE — Telephone Encounter (Signed)
Post ED Visit - Positive Culture Follow-up  Culture report reviewed by antimicrobial stewardship pharmacist: [place] Team _0  Eliseo Gum, Pharm.D. _1  Heide Guile, Pharm.D., BCPS AQ-ID _2  Parks Neptune, Pharm.D., BCPS _3  Alycia Rossetti, Pharm.D., BCPS _4  [place], [place].D., BCPS, AAHIVP _5  Legrand Como, Pharm.D., BCPS, AAHIVP _6  Salome Arnt, PharmD, BCPS _7  Johnnette Gourd, PharmD, BCPS _8  Hughes Better, PharmD, BCPS _9  Leeroy Cha, PharmD _10  Laqueta Linden, PharmD, BCPS _11  Albertina Parr, PharmD  [place] Team _12  Leodis Sias, PharmD _13  Lindell Spar, PharmD _14  Royetta Asal, PharmD _15  Graylin Shiver, Rph _16  Rema Fendt) Glennon Mac, PharmD _17  Arlyn Dunning, PharmD _18  Netta Cedars, PharmD _19  Dia Sitter, PharmD _20  Leone Haven, PharmD _21  Gretta Arab, PharmD _22  Theodis Shove, PharmD _23  Peggyann Juba, PharmD _24  Reuel Boom, PharmD   Positive urine culture Treated with Ciprofloxacin, organism sensitive to the same and no further patient follow-up is required at this time.  Glennon Hamilton 07/15/2022, 10:11 AM

## 2022-07-16 ENCOUNTER — Telehealth: Payer: Self-pay | Admitting: Physician Assistant

## 2022-07-16 ENCOUNTER — Telehealth: Payer: Self-pay

## 2022-07-16 LAB — URINE CULTURE

## 2022-07-16 NOTE — Telephone Encounter (Signed)
     Patient  visit on 9/11  at [facility] you been able to follow up with your primary care physician?yes  The patient was or was not able to obtain any needed medicine or equipment.yes  Are there diet recommendations that you are having difficulty following?yes  Patient expresses understanding of discharge instructions and education provided has no other needs at this time. yes     [place], Care Management  [phone] [address], [place], [place] [address] Phone: [phone] Email: [email or web address]

## 2022-07-16 NOTE — Telephone Encounter (Signed)
Spoke with pt and gave urine cx results. [name] taking Cipro as prescribed and will FU on 9/21 as scheduled. Pt doing well without c/o. No blood in foley bag. No sxs of systemic illness.

## 2022-07-19 ENCOUNTER — Emergency Department (HOSPITAL_COMMUNITY)
Admission: EM | Admit: 2022-07-19 | Discharge: 2022-07-19 | Disposition: A | Discharge status: Home / Self Care | Payer: Medicare Other | Attending: Emergency Medicine | Admitting: Emergency Medicine

## 2022-07-19 ENCOUNTER — Other Ambulatory Visit: Payer: Self-pay

## 2022-07-19 ENCOUNTER — Encounter (HOSPITAL_COMMUNITY): Payer: Self-pay | Admitting: *Deleted

## 2022-07-19 DIAGNOSIS — Z79899 Other long term (current) drug therapy: Secondary | ICD-10-CM | POA: Diagnosis not present

## 2022-07-19 DIAGNOSIS — T83091A Other mechanical complication of indwelling urethral catheter, initial encounter: Secondary | ICD-10-CM | POA: Diagnosis not present

## 2022-07-19 DIAGNOSIS — T839XXA Unspecified complication of genitourinary prosthetic device, implant and graft, initial encounter: Secondary | ICD-10-CM

## 2022-07-19 DIAGNOSIS — Z7982 Long term (current) use of aspirin: Secondary | ICD-10-CM | POA: Insufficient documentation

## 2022-07-19 DIAGNOSIS — Y828 Other medical devices associated with adverse incidents: Secondary | ICD-10-CM | POA: Diagnosis not present

## 2022-07-19 DIAGNOSIS — I10 Essential (primary) hypertension: Secondary | ICD-10-CM | POA: Diagnosis not present

## 2022-07-19 DIAGNOSIS — R300 Dysuria: Secondary | ICD-10-CM | POA: Diagnosis present

## 2022-07-19 LAB — URINALYSIS, ROUTINE W REFLEX MICROSCOPIC
Bacteria, UA: NONE SEEN
Bilirubin Urine: NEGATIVE
Glucose, UA: NEGATIVE mg/dL
Ketones, ur: NEGATIVE mg/dL
Leukocytes,Ua: NEGATIVE
Nitrite: NEGATIVE
Protein, ur: NEGATIVE mg/dL
Specific Gravity, Urine: 1.006 (ref 1.005–1.030)
pH: 6 (ref 5.0–8.0)

## 2022-07-19 LAB — BASIC METABOLIC PANEL WITH GFR
Anion gap: 8 (ref 5–15)
BUN: 11 mg/dL (ref 8–23)
CO2: 30 mmol/L (ref 22–32)
Calcium: 8.8 mg/dL — ABNORMAL LOW (ref 8.9–10.3)
Chloride: 106 mmol/L (ref 98–111)
Creatinine, Ser: 0.94 mg/dL (ref 0.61–1.24)
GFR, Estimated: >60 mL/min
Glucose, Bld: 122 mg/dL — ABNORMAL HIGH (ref 70–99)
Potassium: 4.8 mmol/L (ref 3.5–5.1)
Sodium: 144 mmol/L (ref 135–145)

## 2022-07-19 LAB — CBC
HCT: 31.5 % — ABNORMAL LOW (ref 39.0–52.0)
Hemoglobin: 9.7 g/dL — ABNORMAL LOW (ref 13.0–17.0)
MCH: 29.3 pg (ref 26.0–34.0)
MCHC: 30.8 g/dL (ref 30.0–36.0)
MCV: 95.2 fL (ref 80.0–100.0)
Platelets: 240 10*3/uL (ref 150–400)
RBC: 3.31 MIL/uL — ABNORMAL LOW (ref 4.22–5.81)
RDW: 18.7 % — ABNORMAL HIGH (ref 11.5–15.5)
WBC: 6.7 10*3/uL (ref 4.0–10.5)
nRBC: 0 % (ref 0.0–0.2)

## 2022-07-19 NOTE — Discharge Instructions (Signed)
Urine did not show any active infection however I would recommend continuing your antibiotics.  Follow up with the urologist  Return for new or worsening symptoms

## 2022-07-19 NOTE — ED Triage Notes (Signed)
Pt states his catheter is not draining right and he is having pain when he urinates  Moderate amount of clear yellow urine in leg bag  Pt states the sx started yesterday

## 2022-07-19 NOTE — ED Provider Notes (Signed)
[facility] EMERGENCY DEPARTMENT Provider Note   CSN: [id number] Arrival date & time: 07/19/22  6659     History  Chief Complaint  Patient presents with   Dysuria    Adrin Julian is a 76 y.o. male history of pulmonary fibrosis on chronic 4 L of oxygen, hypertension, RA, BPH with urinary retention, recent diagnosis UTI here for evaluation of Foley catheter not draining.  Noted when he woke up this morning had some suprapubic pressure and he did not have much in his Foley catheter bag.  The time he came to the emergency department he noted that it was draining better however still has some pressure to his suprapubic region.  [name] compliant with his antibiotics, Cipro which is pansensitive on his culture.  He denies any fever, nausea, vomiting, flank pain.  No blood in his Foley.  [name] not anticoagulated.  HPI     Home Medications Prior to Admission medications   Medication Sig Start Date End Date Taking? Authorizing Provider  acetaminophen (TYLENOL) 500 MG tablet Take 1,000 mg by mouth every 6 (six) hours as needed for moderate pain or headache.    [provider]  albuterol (VENTOLIN HFA) 108 (90 Base) MCG/ACT inhaler Inhale 2 puffs into the lungs every 6 (six) hours as needed. 11/22/17   [provider]  alfuzosin (UROXATRAL) 10 MG 24 hr tablet Take 1 tablet (10 mg total) by mouth daily with breakfast. 07/02/22   Irine Seal, MD  amLODipine (NORVASC) 10 MG tablet Take 10 mg by mouth daily.    [provider]  aspirin 81 MG tablet Take 81 mg by mouth daily.    [provider]  atorvastatin (LIPITOR) 20 MG tablet Take 20 mg by mouth daily. 08/19/18   [provider]  azaTHIOprine (IMURAN) 50 MG tablet Take 100 mg by mouth daily.  04/13/19   [provider]  ciprofloxacin (CIPRO) 500 MG tablet Take 1 tablet (500 mg total) by mouth every 12 (twelve) hours. 07/13/22   [place], Berneice Heinrich, PA-C  fluticasone (FLONASE) 50 MCG/ACT  nasal spray Place 2 sprays into both nostrils daily. 11/08/20   Brand Males, MD  folic acid (FOLVITE) 935 MCG tablet Take 400 mcg by mouth daily.    [provider]  HYDROcodone bit-homatropine (HYCODAN) 5-1.5 MG/5ML syrup Take 5 mLs by mouth 2 (two) times daily as needed for cough. 12/15/21   Brand Males, MD  insulin NPH Human (NOVOLIN N) 100 UNIT/ML injection Inject 12 Units into the skin See admin instructions. Inject 10 units every morning and 20 units every evening    [provider]  Insulin Syringe-Needle U-100 (INSULIN SYRINGE .3CC/31GX5/16") 31G X 5/16" 0.3 ML MISC Inject 1 Syringe into the skin as directed. 02/27/20   [provider]  levothyroxine (SYNTHROID, LEVOTHROID) 25 MCG tablet Take 25 mcg by mouth daily.    [provider]  metFORMIN (GLUCOPHAGE-XR) 500 MG 24 hr tablet Take 2 tablets (1,000 mg total) by mouth 2 (two) times daily. 05/24/20   Martinique, Peter M, MD  pantoprazole (PROTONIX) 40 MG tablet Take 40 mg by mouth daily. 01/14/20   [provider]  predniSONE (DELTASONE) 1 MG tablet Take 3 mg by mouth daily. 02/12/21   [provider]  Treprostinil (TYVASO DPI MAINTENANCE KIT) 48 MCG POWD Inhale 48 mcg into the lungs in the morning, at noon, in the evening, and at bedtime.    [provider]  umeclidinium bromide (INCRUSE ELLIPTA) 62.5 MCG/ACT AEPB  Inhale 1 puff into the lungs daily. 02/24/22   Brand Males, MD      Allergies    Bactrim [sulfamethoxazole-trimethoprim], Spiriva respimat [tiotropium bromide monohydrate], and Ofev [nintedanib]    Review of Systems   Review of Systems  Constitutional: Negative.   HENT: Negative.    Respiratory: Negative.    Cardiovascular: Negative.   Gastrointestinal: Negative.   Genitourinary:  Positive for decreased urine volume and difficulty urinating. Negative for dysuria, enuresis, flank pain, frequency, genital sores, hematuria, penile discharge, penile pain,  penile swelling, scrotal swelling, testicular pain and urgency.  Musculoskeletal: Negative.   Skin: Negative.   Neurological: Negative.   All other systems reviewed and are negative.   Physical Exam Updated Vital Signs BP 125/67   Pulse (!) 101   Temp 98 F (36.7 C) (Oral)   Resp (!) 25   Ht _0  (1.676 m)   Wt 71 kg   BMI 25.26 kg/m  Physical Exam Vitals and nursing note reviewed.  Constitutional:      General: [name] not in acute distress.    Appearance: [name] well-developed. [name] not ill-appearing, toxic-appearing or diaphoretic.  HENT:     Head: Normocephalic and atraumatic.     Nose: Nose normal.     Mouth/Throat:     Mouth: Mucous membranes are moist.  Eyes:     Pupils: Pupils are equal, round, and reactive to light.  Cardiovascular:     Rate and Rhythm: Normal rate and regular rhythm.     Pulses: Normal pulses.     Heart sounds: Normal heart sounds.  Pulmonary:     Effort: Pulmonary effort is normal. No respiratory distress.     Breath sounds: Normal breath sounds.     Comments: On 4 L [place]- Chronic Abdominal:     General: Bowel sounds are normal. There is no distension.     Palpations: Abdomen is soft.     Tenderness: There is no right CVA tenderness, left CVA tenderness, guarding or rebound.     Comments: minimal suprapubic fullness  Genitourinary:    Comments: Yellow urine without gross hematuria in leg bag. Appear to be draining on exam. Musculoskeletal:        General: No swelling, tenderness, deformity or signs of injury. Normal range of motion.     Cervical back: Normal range of motion and neck supple.  Skin:    General: Skin is warm and dry.     Capillary Refill: Capillary refill takes less than 2 seconds.  Neurological:     General: No focal deficit present.     Mental Status: [name] alert and oriented to person, place, and time.     ED Results / Procedures / Treatments   Labs (all labs ordered are listed, but only abnormal results are  displayed) Labs Reviewed  CBC - Abnormal; Notable for the following components:      Result Value   RBC 3.31 (*)    Hemoglobin 9.7 (*)    HCT 31.5 (*)    RDW 18.7 (*)    All other components within normal limits  BASIC METABOLIC PANEL - Abnormal; Notable for the following components:   Glucose, Bld 122 (*)    Calcium 8.8 (*)    All other components within normal limits  URINALYSIS, ROUTINE W REFLEX MICROSCOPIC - Abnormal; Notable for the following components:   Color, Urine STRAW (*)    Hgb urine dipstick SMALL (*)    All other components  within normal limits  URINE CULTURE    EKG None  Radiology No results found.  Procedures Procedures    Medications Ordered in ED Medications - No data to display  ED Course/ Medical Decision Making/ A&P     76 year old with history of chronic hypoxic respiratory failure, recent UTI diagnosis, BPH with obstruction s/p Foley catheter placement here for evaluation of Foley catheter not draining.  Feels like he has some suprapubic pain.  [name] tolerating p.o. intake.  No hematuria.  Negative CVA tap bilaterally, normal vital signs on chronic home oxygen.  We will plan on labs and imaging   Labs and imaging personally viewed and interpreted:  CBC without leukocytosis, hemoglobin 9.7> similar to prior Metabolic panel without significant abnormality UA negative for infection, culture sent  Attempted to irrigate manipulate Foley catheter.  No significant increase in drainage.  Given he is retaining greater than 600 on bladder scan will replace Foley catheter.  Attempted to irrigate Foley catheter without success at further drainage.  Will replace.  Nursing replace Foley catheter.  He had good output of greater than 800 which is to be expected based off his bladder scan.  No hematuria.  Encouraged him to complete his antibiotics that he was on previously for UTI, return for new or worsening symptoms.  The patient has been appropriately  medically screened and/or stabilized in the ED. I have low suspicion for any other emergent medical condition which would require further screening, evaluation or treatment in the ED or require inpatient management.  Patient is hemodynamically stable and in no acute distress.  Patient able to ambulate in department prior to ED.  Evaluation does not show acute pathology that would require ongoing or additional emergent interventions while in the emergency department or further inpatient treatment.  I have discussed the diagnosis with the patient and answered all questions.  Pain is been managed while in the emergency department and patient has no further complaints prior to discharge.  Patient is comfortable with plan discussed in room and is stable for discharge at this time.  I have discussed strict return precautions for returning to the emergency department.  Patient was encouraged to follow-up with PCP/specialist refer to at discharge.                           Medical Decision Making Amount and/or Complexity of Data Reviewed Independent Historian: spouse External Data Reviewed: labs, radiology and notes. Labs: ordered. Decision-making details documented in ED Course. Radiology: ordered and independent interpretation performed. Decision-making details documented in ED Course.  Risk OTC drugs. Prescription drug management. Parenteral controlled substances. Diagnosis or treatment significantly limited by social determinants of health.          Final Clinical Impression(s) / ED Diagnoses Final diagnoses:  Problem with Foley catheter, initial encounter [facility])    Rx / DC Orders ED Discharge Orders     None         Raetta Agostinelli A, PA-C 07/19/22 [place], [place], DO 07/20/22 [phone]

## 2022-07-20 LAB — URINE CULTURE: Culture: NO GROWTH

## 2022-07-23 ENCOUNTER — Ambulatory Visit: Payer: Medicare Other | Admitting: Physician Assistant

## 2022-07-23 VITALS — BP 112/64 | HR 99

## 2022-07-23 DIAGNOSIS — N138 Other obstructive and reflux uropathy: Secondary | ICD-10-CM

## 2022-07-23 DIAGNOSIS — N401 Enlarged prostate with lower urinary tract symptoms: Secondary | ICD-10-CM | POA: Diagnosis not present

## 2022-07-23 DIAGNOSIS — R339 Retention of urine, unspecified: Secondary | ICD-10-CM | POA: Diagnosis not present

## 2022-07-23 MED ORDER — FINASTERIDE 5 MG PO TABS: 5.0000 mg | ORAL_TABLET | Freq: Every day | ORAL | 0 refills | Status: DC

## 2022-07-23 NOTE — Progress Notes (Signed)
Assessment: 1. Urinary retention  2. BPH with urinary obstruction    Plan: Will start finasteride and have pt FU next week for voiding trial/cysto with Dr. Felipa Eth. Pt advised to see [place] if he has issues with foley draining during regular hours. ED precautions discussed.  Chief Complaint: No chief complaint on file.   HPI: [name] is a 76 y.o. male who presents for continued evaluation of urinary retention and BPH.  Patient has tolerated his Foley catheter since last visit, but is having some drainage issues.  He was placed on Cipro for UTI based on culture results and went to the emergency department apartment on 917 with suprapubic pain and decreased drainage into the Foley bag.  They were unable to irrigate his catheter and ended up replacing it with 800 mL of output following placement of new catheter.  He denies fever, chills, gross hematuria.  No lower abdominal pain today  07/13/2022 [name] is a 76 y.o. male who presents for continued evaluation of urinary retention with c/o burning and frequency. Pt was seen in ED 2 days ago and noted to have 315m on bladder scan after voiding 1019m Pt refused replacement of foley.  Urine was cultured.  No antibiotics given. Pt had successful voiding trial last week and continues alfuzosin. No gross hematuria. No fever, chills, back pain.  Creatinine 0.99 on 06/24/2022   Cx obtained in ED on 9/9 positive for > 100K colonies of Pseudomonas and Enterococcus.  Preliminary sensitivities indicate the only p.o. option is Cipro.    UA= >30 WBCs, many bacteria, nitrite positive PVR= >46565m   07/08/22 [name] a [age]o. male who presents for continued evaluation of BPH with acute retention. He has been on alfuzosin since last visit and tolerating well. No pain. No gross hematuria. Voiding trial scheduled for today.     07/02/22 [name] is a [age] male who was seen in the ER on 06/24/22 for a 2 days history of difficulty voiding  with small intermittent voids without pain.  He has no dysuria or hematuria.  He has not had prior voiding difficulties, UTI, Stones or GU surgery.  He had a foley placed and was found to have an 800m76mR.  [name] tolerating the foley.  He had a normal Cr and a clear UA.   He was not started on any meds for BPH.   [name] a diabetic but has no neuropathy symptoms.  He had a CT in 2021 and his prostate had a transverse diameter of 4.2cm and no middle lobe.  He had bilateral renal cysts.   Portions of the above documentation were copied from a prior visit for review purposes only.  Allergies: Allergies  Allergen Reactions   Bactrim [Sulfamethoxazole-Trimethoprim] Shortness Of Breath    Patient states "got short winded"   Spiriva Respimat [Tiotropium Bromide Monohydrate] Rash   Ofev [Nintedanib]     PMH: Past Medical History:  Diagnosis Date   Arthritis    COPD (chronic obstructive pulmonary disease) (HCC)    Diabetes mellitus    Hypertension    Pneumonia    Thyroid disease     PSH: Past Surgical History:  Procedure Laterality Date   KNEE ARTHROSCOPY     NO PAST SURGERIES     RIGHT HEART CATH N/A 11/18/2020   Procedure: RIGHT HEART CATH;  Surgeon: BensJolaine Artist;  [facility];  Service: Cardiovascular;  Laterality: N/A;  RIGHT/LEFT HEART CATH AND CORONARY ANGIOGRAPHY N/A 05/21/2020   Procedure: RIGHT/LEFT HEART CATH AND CORONARY ANGIOGRAPHY;  Surgeon: Martinique, Peter M, MD;  Location: [facility];  Service: Cardiovascular;  Laterality: N/A;    SH: Social History   Tobacco Use   Smoking status: Former    Packs/day: 0.50    Years: 10.00    Total pack years: 5.00    Types: Cigarettes    Quit date: 12/03/1984    Years since quitting: 37.6   Smokeless tobacco: Never   Tobacco comments:    Quit 40 years ago.   Substance Use Topics   Alcohol use: No   Drug use: No    ROS: All other review of systems were reviewed and are negative except what is  noted above in HPI  PE: BP 112/64   Pulse 99  GENERAL APPEARANCE: Patient appears in his usual state of health HEENT:  Atraumatic, normocephalic NECK:  Supple. Trachea midline ABDOMEN:  Soft, non-tender, no masses EXTREMITIES:  Moves all extremities well NEUROLOGIC:  Alert and oriented x 3, normal gait MENTAL STATUS:  appropriate BACK:  Non-tender to palpation, No CVAT SKIN:  Warm, dry, and intact   Results: Laboratory Data: Lab Results  Component Value Date   WBC 6.7 07/19/2022   HGB 9.7 (L) 07/19/2022   HCT 31.5 (L) 07/19/2022   MCV 95.2 07/19/2022   PLT 240 07/19/2022    Lab Results  Component Value Date   CREATININE 0.94 07/19/2022    Lab Results  Component Value Date   HGBA1C 7.1 (H) 11/14/2020    Urinalysis    Component Value Date/Time   COLORURINE STRAW (A) 07/19/2022 0923   APPEARANCEUR CLEAR 07/19/2022 0923   APPEARANCEUR Clear 07/13/2022 1340   LABSPEC 1.006 07/19/2022 0923   PHURINE 6.0 07/19/2022 0923   GLUCOSEU NEGATIVE 07/19/2022 0923   HGBUR SMALL (A) 07/19/2022 0923   BILIRUBINUR NEGATIVE 07/19/2022 0923   BILIRUBINUR Negative 07/13/2022 1340   KETONESUR NEGATIVE 07/19/2022 0923   PROTEINUR NEGATIVE 07/19/2022 0923   NITRITE NEGATIVE 07/19/2022 0923   LEUKOCYTESUR NEGATIVE 07/19/2022 0923    Lab Results  Component Value Date   LABMICR See below: 07/13/2022   WBCUA >30 (A) 07/13/2022   LABEPIT 0-10 07/13/2022   BACTERIA NONE SEEN 07/19/2022    Pertinent Imaging: No results found for this or any previous visit.  No results found for this or any previous visit.  No results found for this or any previous visit.  No results found for this or any previous visit.  No results found for this or any previous visit.  No results found for this or any previous visit.  No results found for this or any previous visit.  No results found for this or any previous visit.  No results found for this or any previous visit (from the past 24  hour(s)).

## 2022-07-23 NOTE — Progress Notes (Signed)
Patient is presented today for bladder irregulation. Bladder was irregualted

## 2022-07-29 ENCOUNTER — Ambulatory Visit (INDEPENDENT_AMBULATORY_CARE_PROVIDER_SITE_OTHER): Payer: Medicare Other | Admitting: Urology

## 2022-07-29 ENCOUNTER — Encounter: Payer: Self-pay | Admitting: Urology

## 2022-07-29 VITALS — BP 131/74 | HR 85 | Ht 66.0 in | Wt 158.0 lb

## 2022-07-29 DIAGNOSIS — R339 Retention of urine, unspecified: Secondary | ICD-10-CM | POA: Diagnosis not present

## 2022-07-29 DIAGNOSIS — N401 Enlarged prostate with lower urinary tract symptoms: Secondary | ICD-10-CM

## 2022-07-29 DIAGNOSIS — Z87440 Personal history of urinary (tract) infections: Secondary | ICD-10-CM

## 2022-07-29 DIAGNOSIS — N138 Other obstructive and reflux uropathy: Secondary | ICD-10-CM

## 2022-07-29 LAB — BLADDER VOIDING TRIAL: Scan Result: 91

## 2022-07-29 MED ORDER — CIPROFLOXACIN HCL 500 MG PO TABS
500.0000 mg | ORAL_TABLET | Freq: Once | ORAL | Status: AC
  Administered 2022-07-29: 500 mg via ORAL

## 2022-07-29 NOTE — Progress Notes (Signed)
Assessment: 1. Urinary retention   2. BPH with urinary obstruction   3. History of UTI     Plan: I personally reviewed the patient's chart including prior office notes and lab results. I also reviewed notes regarding his ER visit. Cystoscopy findings discussed with the patient today.  He does not have significant prostate enlargement. Options for management discussed including continued medical therapy versus possible minimally invasive procedures or TURP. Foley removed today Cipro x 1 following cystoscopy Continue alfuzosin and finasteride Return to office in 1 week with bladder scan  Chief Complaint: Chief Complaint  Patient presents with   Urinary Retention    HPI: [name] is a 76 y.o. male who presents for continued evaluation of urinary retention and BPH with obstruction.  He initially presented to the emergency room on 06/24/2022 following 2 days of difficulty voiding.  A Foley catheter was placed with return of 800 mL.  He had a normal creatinine and an unremarkable urinalysis.  He was not started on any BPH meds at that time.  He was started on alfuzosin on 07/02/2022.  His Foley catheter was removed on 07/08/2022 after a successful voiding trial.  He was seen in the emergency room several days later and found to have 350 mL on a bladder scan.  He refused placement of a Foley catheter.  Urine culture grew >100 K Pseudomonas and Enterococcus.  He was seen in the office on 07/13/2022.  Bladder scan at that time showed >465 mL.  A Foley catheter was replaced.  He was treated with Cipro. He presents today for further evaluation with cystoscopy and voiding trial. His foley has been draining well.  No gross hematuria.  He continues on alfuzosin and finasteride.  He completed the Cipro.   Portions of the above documentation were copied from a prior visit for review purposes only.  Allergies: Allergies  Allergen Reactions   Bactrim [Sulfamethoxazole-Trimethoprim] Shortness Of  Breath    Patient states "got short winded"   Spiriva Respimat [Tiotropium Bromide Monohydrate] Rash   Ofev [Nintedanib]     PMH: Past Medical History:  Diagnosis Date   Arthritis    COPD (chronic obstructive pulmonary disease) (HCC)    Diabetes mellitus    Hypertension    Pneumonia    Thyroid disease     PSH: Past Surgical History:  Procedure Laterality Date   KNEE ARTHROSCOPY     NO PAST SURGERIES     RIGHT HEART CATH N/A 11/18/2020   Procedure: RIGHT HEART CATH;  Surgeon: Jolaine Artist, MD;  Location: [facility];  Service: Cardiovascular;  Laterality: N/A;   RIGHT/LEFT HEART CATH AND CORONARY ANGIOGRAPHY N/A 05/21/2020   Procedure: RIGHT/LEFT HEART CATH AND CORONARY ANGIOGRAPHY;  Surgeon: Martinique, Peter M, MD;  Location: [facility];  Service: Cardiovascular;  Laterality: N/A;    SH: Social History   Tobacco Use   Smoking status: Former    Packs/day: 0.50    Years: 10.00    Total pack years: 5.00    Types: Cigarettes    Quit date: 12/03/1984    Years since quitting: 37.6   Smokeless tobacco: Never   Tobacco comments:    Quit 40 years ago.   Substance Use Topics   Alcohol use: No   Drug use: No    ROS: Constitutional:  Negative for fever, chills, weight loss CV: Negative for chest pain, previous MI, hypertension Respiratory:  Negative for shortness of breath, wheezing, sleep apnea, frequent cough  GI:  Negative for nausea, vomiting, bloody stool, GERD  PE: BP 131/74   Pulse 85   Ht _0  (1.676 m)   Wt 158 lb (71.7 kg)   BMI 25.50 kg/m  GENERAL APPEARANCE:  Well appearing, well developed, well nourished, NAD; on oxygen HEENT:  Atraumatic, normocephalic, oropharynx clear NECK:  Supple without lymphadenopathy or thyromegaly ABDOMEN:  Soft, non-tender, no masses EXTREMITIES:  Moves all extremities well, without clubbing, cyanosis, or edema NEUROLOGIC:  Alert and oriented x 3, normal gait, CN II-XII grossly intact MENTAL STATUS:   appropriate BACK:  Non-tender to palpation, No CVAT SKIN:  Warm, dry, and intact   Results: None  Procedure:  Flexible Cystourethroscopy  Pre-operative Diagnosis:  Urinary retention  Post-operative Diagnosis:  Urinary retention  Anesthesia:  local with lidocaine jelly  Surgical Narrative:  After appropriate informed consent was obtained, the patient was prepped and draped in the usual sterile fashion in the supine position.  The patient was correctly identified and the proper procedure delineated prior to proceeding.  Sterile lidocaine gel was instilled in the urethra. The flexible cystoscope was introduced without difficulty.  Findings:  Anterior urethra: Normal  Posterior urethra:  mild lateral lobe enlargement; no median lobe  Bladder:  trabeculations  Ureteral orifices: normal  Additional findings: none  Saline bladder wash for cytology was not performed.    The cystoscope was then removed.  The patient tolerated the procedure well.  Procedure:  VOIDING TRIAL  Volume of sterile water instilled: 250 mL Volume voided by patient: 200 mL Post-void residual by bladder scan: 91 mL Instructed to return to office if has not voided by 4 PM

## 2022-07-29 NOTE — Progress Notes (Signed)
Catheter Removal  Patient is present today for a catheter removal.  62m of water was drained from the balloon. A 16FR foley cath was removed from the bladder, no complications were noted. Patient tolerated well.  Performed by: KLevi Aland CMA   Fill and Pull Catheter Removal  Patient is present today for a catheter removal.  Patient was cleaned and prepped in a sterile fashion 3274mof sterile water/ saline was instilled into the bladder during cysto procedure by MD when the patient felt the urge to urinate.  Patient was then given some time to void on their own.  Patient can void  23028mn their own after some time.  Patient tolerated well.  Performed by: KouLevi AlandMA   Follow up/ Additional notes: MD to see after procedure   post void residual =60m42m

## 2022-08-02 DIAGNOSIS — R0902 Hypoxemia: Secondary | ICD-10-CM | POA: Diagnosis not present

## 2022-08-06 ENCOUNTER — Encounter: Payer: Self-pay | Admitting: Urology

## 2022-08-06 ENCOUNTER — Ambulatory Visit (INDEPENDENT_AMBULATORY_CARE_PROVIDER_SITE_OTHER): Payer: Medicare Other | Admitting: Urology

## 2022-08-06 VITALS — BP 107/64 | HR 89 | Ht 66.0 in | Wt 158.0 lb

## 2022-08-06 DIAGNOSIS — Z87440 Personal history of urinary (tract) infections: Secondary | ICD-10-CM | POA: Diagnosis not present

## 2022-08-06 DIAGNOSIS — N138 Other obstructive and reflux uropathy: Secondary | ICD-10-CM | POA: Diagnosis not present

## 2022-08-06 DIAGNOSIS — N401 Enlarged prostate with lower urinary tract symptoms: Secondary | ICD-10-CM | POA: Diagnosis not present

## 2022-08-06 DIAGNOSIS — R8290 Unspecified abnormal findings in urine: Secondary | ICD-10-CM

## 2022-08-06 LAB — URINALYSIS, ROUTINE W REFLEX MICROSCOPIC
Bilirubin, UA: NEGATIVE
Glucose, UA: NEGATIVE
Ketones, UA: NEGATIVE
Nitrite, UA: POSITIVE — AB
Specific Gravity, UA: 1.01 (ref 1.005–1.030)
Urobilinogen, Ur: 0.2 mg/dL (ref 0.2–1.0)
pH, UA: 6 (ref 5.0–7.5)

## 2022-08-06 LAB — BLADDER SCAN AMB NON-IMAGING: Scan Result: 258

## 2022-08-06 LAB — MICROSCOPIC EXAMINATION: WBC, UA: >30 /hpf — ABNORMAL HIGH (ref 0–5)

## 2022-08-06 MED ORDER — CIPROFLOXACIN HCL 500 MG PO TABS: 500.0000 mg | ORAL_TABLET | Freq: Two times a day (BID) | ORAL | 0 refills | Status: AC

## 2022-08-06 NOTE — Progress Notes (Signed)
Assessment: 1. BPH with urinary obstruction   2. History of UTI      Plan: Continue alfuzosin and finasteride His increased residual today may be secondary to recently running out of the alfuzosin. Urine culture sent today Begin Cipro 500 mg BID x 7 days.  Rx sent. Return to office in 2 weeks  Chief Complaint: Chief Complaint  Patient presents with   Benign Prostatic Hypertrophy    HPI: [name] is a 76 y.o. male who presents for continued evaluation of urinary retention and BPH with obstruction.  He initially presented to the emergency room on 06/24/2022 following 2 days of difficulty voiding.  A Foley catheter was placed with return of 800 mL.  He had a normal creatinine and an unremarkable urinalysis.  He was not started on any BPH meds at that time.  He was started on alfuzosin on 07/02/2022.  His Foley catheter was removed on 07/08/2022 after a successful voiding trial.  He was seen in the emergency room several days later and found to have 350 mL on a bladder scan.  He refused placement of a Foley catheter.  Urine culture grew >100 K Pseudomonas and Enterococcus.  He was seen in the office on 07/13/2022.  Bladder scan at that time showed >465 mL.  A Foley catheter was replaced.  He was treated with Cipro. He was evaluated with cystoscopy on 07/29/2022.  He was found to have mild enlargement of the prostate without a median lobe and bladder trabeculations.  He was able to void following cystoscopy.  He returns today for follow-up.  He reports that he ran out of his alfuzosin about 2 days ago.  He has been voiding without significant difficulty since the Foley was removed last week.  No dysuria or gross hematuria.  Portions of the above documentation were copied from a prior visit for review purposes only.  Allergies: Allergies  Allergen Reactions   Bactrim [Sulfamethoxazole-Trimethoprim] Shortness Of Breath    Patient states "got short winded"   Spiriva Respimat [Tiotropium  Bromide Monohydrate] Rash   Ofev [Nintedanib]     PMH: Past Medical History:  Diagnosis Date   Arthritis    COPD (chronic obstructive pulmonary disease) (HCC)    Diabetes mellitus    Hypertension    Pneumonia    Thyroid disease     PSH: Past Surgical History:  Procedure Laterality Date   KNEE ARTHROSCOPY     NO PAST SURGERIES     RIGHT HEART CATH N/A 11/18/2020   Procedure: RIGHT HEART CATH;  Surgeon: Jolaine Artist, MD;  Location: [facility];  Service: Cardiovascular;  Laterality: N/A;   RIGHT/LEFT HEART CATH AND CORONARY ANGIOGRAPHY N/A 05/21/2020   Procedure: RIGHT/LEFT HEART CATH AND CORONARY ANGIOGRAPHY;  Surgeon: Martinique, Peter M, MD;  Location: [facility];  Service: Cardiovascular;  Laterality: N/A;    SH: Social History   Tobacco Use   Smoking status: Former    Packs/day: 0.50    Years: 10.00    Total pack years: 5.00    Types: Cigarettes    Quit date: 12/03/1984    Years since quitting: 37.6   Smokeless tobacco: Never   Tobacco comments:    Quit 40 years ago.   Substance Use Topics   Alcohol use: No   Drug use: No    ROS: Constitutional:  Negative for fever, chills, weight loss CV: Negative for chest pain, previous MI, hypertension Respiratory:  Negative for shortness of breath, wheezing,  sleep apnea, frequent cough GI:  Negative for nausea, vomiting, bloody stool, GERD  PE: BP 107/64   Pulse 89   Ht 5' 6" (1.676 m)   Wt 158 lb (71.7 kg)   BMI 25.50 kg/m  GENERAL APPEARANCE:  Well appearing, well developed, well nourished, NAD HEENT:  Atraumatic, normocephalic, oropharynx clear NECK:  Supple without lymphadenopathy or thyromegaly ABDOMEN:  Soft, non-tender, no masses EXTREMITIES:  Moves all extremities well, without clubbing, cyanosis, or edema NEUROLOGIC:  Alert and oriented x 3, normal gait, CN II-XII grossly intact MENTAL STATUS:  appropriate BACK:  Non-tender to palpation, No CVAT SKIN:  Warm, dry, and  intact   Results: U/A: >30 WBCs, 3-10 RBCs, moderate bacteria, nitrite positive  PVR: 258 ml

## 2022-08-06 NOTE — Progress Notes (Signed)
post void residual =246m

## 2022-08-10 LAB — URINE CULTURE

## 2022-08-11 ENCOUNTER — Telehealth: Payer: Self-pay

## 2022-08-11 NOTE — Telephone Encounter (Signed)
Called patient to inform him of MD response to urine culture.  Patient will stop cipro rx and is scheduled for gentamicin injection on 10/11, 10/12, and 10/13 per MD.

## 2022-08-11 NOTE — Telephone Encounter (Signed)
-----  Message from Primus Bravo, MD sent at 08/11/2022  9:30 AM EDT ----- Please notify patient that his urine culture shows a UTI that will not be adequately treated with oral antibiotics. Recommend Gentamicin 80 mg IM daily x 3 days.   Please arrange for patient to come to office this week for Gentamicin injections.

## 2022-08-12 ENCOUNTER — Encounter: Payer: Self-pay | Admitting: Urology

## 2022-08-12 ENCOUNTER — Ambulatory Visit (INDEPENDENT_AMBULATORY_CARE_PROVIDER_SITE_OTHER): Payer: Medicare Other | Admitting: Urology

## 2022-08-12 DIAGNOSIS — Z87440 Personal history of urinary (tract) infections: Secondary | ICD-10-CM

## 2022-08-12 MED ORDER — GENTAMICIN SULFATE 40 MG/ML IJ SOLN
80.0000 mg | Freq: Once | INTRAMUSCULAR | Status: AC
  Administered 2022-08-12: 80 mg via INTRAMUSCULAR

## 2022-08-12 NOTE — Progress Notes (Signed)
Gentamicin Injection  Medication: Gentamicin  Dose: 80 mg  Location: right  Lot: [id number] Exp: 09/2022  Patient tolerated well, no complications were noted  Performed by: Levi Aland, CMA

## 2022-08-13 ENCOUNTER — Ambulatory Visit (INDEPENDENT_AMBULATORY_CARE_PROVIDER_SITE_OTHER): Payer: Medicare Other | Admitting: Urology

## 2022-08-13 ENCOUNTER — Encounter: Payer: Self-pay | Admitting: Urology

## 2022-08-13 DIAGNOSIS — R8290 Unspecified abnormal findings in urine: Secondary | ICD-10-CM

## 2022-08-13 DIAGNOSIS — M0589 Other rheumatoid arthritis with rheumatoid factor of multiple sites: Secondary | ICD-10-CM | POA: Diagnosis not present

## 2022-08-13 DIAGNOSIS — E559 Vitamin D deficiency, unspecified: Secondary | ICD-10-CM | POA: Diagnosis not present

## 2022-08-13 DIAGNOSIS — D72819 Decreased white blood cell count, unspecified: Secondary | ICD-10-CM | POA: Diagnosis not present

## 2022-08-13 DIAGNOSIS — Z87440 Personal history of urinary (tract) infections: Secondary | ICD-10-CM

## 2022-08-13 DIAGNOSIS — J9611 Chronic respiratory failure with hypoxia: Secondary | ICD-10-CM | POA: Diagnosis not present

## 2022-08-13 DIAGNOSIS — E109 Type 1 diabetes mellitus without complications: Secondary | ICD-10-CM | POA: Diagnosis not present

## 2022-08-13 DIAGNOSIS — Z79899 Other long term (current) drug therapy: Secondary | ICD-10-CM | POA: Diagnosis not present

## 2022-08-13 DIAGNOSIS — Z13820 Encounter for screening for osteoporosis: Secondary | ICD-10-CM | POA: Diagnosis not present

## 2022-08-13 DIAGNOSIS — Z23 Encounter for immunization: Secondary | ICD-10-CM | POA: Diagnosis not present

## 2022-08-13 DIAGNOSIS — J849 Interstitial pulmonary disease, unspecified: Secondary | ICD-10-CM | POA: Diagnosis not present

## 2022-08-13 DIAGNOSIS — M150 Primary generalized (osteo)arthritis: Secondary | ICD-10-CM | POA: Diagnosis not present

## 2022-08-13 MED ORDER — GENTAMICIN SULFATE 40 MG/ML IJ SOLN
80.0000 mg | Freq: Once | INTRAMUSCULAR | Status: AC
  Administered 2022-08-13: 80 mg via INTRAMUSCULAR

## 2022-08-13 NOTE — Progress Notes (Signed)
IM injection  Medication: Gentamicin Dose: 80 Location: left  Lot: [id number] Exp: 09/02/2022  Patient tolerated well, no complications were noted  Performed by: Levi Aland, CMA

## 2022-08-14 ENCOUNTER — Encounter: Payer: Self-pay | Admitting: Urology

## 2022-08-14 ENCOUNTER — Ambulatory Visit (INDEPENDENT_AMBULATORY_CARE_PROVIDER_SITE_OTHER): Payer: Medicare Other | Admitting: Urology

## 2022-08-14 DIAGNOSIS — Z87440 Personal history of urinary (tract) infections: Secondary | ICD-10-CM

## 2022-08-14 DIAGNOSIS — N3000 Acute cystitis without hematuria: Secondary | ICD-10-CM

## 2022-08-14 MED ORDER — GENTAMICIN SULFATE 40 MG/ML IJ SOLN
80.0000 mg | Freq: Once | INTRAMUSCULAR | Status: AC
  Administered 2022-08-14: 80 mg via INTRAMUSCULAR

## 2022-08-14 NOTE — Progress Notes (Signed)
Gentamicin injection  Medication: Gentamicin Dose: 81m Location: right Lot: [id number]: 09/2022  Patient tolerated well, no complications were noted  Performed by: AGibson Ramp RN

## 2022-08-19 DIAGNOSIS — E119 Type 2 diabetes mellitus without complications: Secondary | ICD-10-CM | POA: Diagnosis not present

## 2022-08-19 DIAGNOSIS — H2513 Age-related nuclear cataract, bilateral: Secondary | ICD-10-CM | POA: Diagnosis not present

## 2022-08-19 DIAGNOSIS — H02403 Unspecified ptosis of bilateral eyelids: Secondary | ICD-10-CM | POA: Diagnosis not present

## 2022-08-19 DIAGNOSIS — H5213 Myopia, bilateral: Secondary | ICD-10-CM | POA: Diagnosis not present

## 2022-08-20 ENCOUNTER — Ambulatory Visit (INDEPENDENT_AMBULATORY_CARE_PROVIDER_SITE_OTHER): Payer: Medicare Other | Admitting: Urology

## 2022-08-20 ENCOUNTER — Encounter: Payer: Self-pay | Admitting: Urology

## 2022-08-20 VITALS — BP 120/71 | HR 99 | Ht 66.0 in | Wt 158.0 lb

## 2022-08-20 DIAGNOSIS — Z87440 Personal history of urinary (tract) infections: Secondary | ICD-10-CM | POA: Diagnosis not present

## 2022-08-20 DIAGNOSIS — N138 Other obstructive and reflux uropathy: Secondary | ICD-10-CM

## 2022-08-20 DIAGNOSIS — N401 Enlarged prostate with lower urinary tract symptoms: Secondary | ICD-10-CM | POA: Diagnosis not present

## 2022-08-20 LAB — URINALYSIS, ROUTINE W REFLEX MICROSCOPIC
Bilirubin, UA: NEGATIVE
Glucose, UA: NEGATIVE
Ketones, UA: NEGATIVE
Nitrite, UA: NEGATIVE
Protein,UA: NEGATIVE
Specific Gravity, UA: 1.015 (ref 1.005–1.030)
Urobilinogen, Ur: 1 mg/dL (ref 0.2–1.0)
pH, UA: 7.5 (ref 5.0–7.5)

## 2022-08-20 LAB — MICROSCOPIC EXAMINATION

## 2022-08-20 LAB — BLADDER SCAN AMB NON-IMAGING: Scan Result: 8

## 2022-08-20 NOTE — Progress Notes (Signed)
post void residual =73m

## 2022-08-20 NOTE — Progress Notes (Signed)
Assessment: 1. BPH with urinary obstruction   2. History of UTI     Plan: [name] doing well as far as emptying his bladder.  It appears that the UTI has been appropriately treated. Continue alfuzosin and finasteride Return to office in 6-8 weeks.  Chief Complaint: Chief Complaint  Patient presents with   Benign Prostatic Hypertrophy    HPI: [name] is a 76 y.o. male who presents for continued evaluation of urinary retention and BPH with obstruction.  He initially presented to the emergency room on 06/24/2022 following 2 days of difficulty voiding.  A Foley catheter was placed with return of 800 mL.  He had a normal creatinine and an unremarkable urinalysis.  He was not started on any BPH meds at that time.  He was started on alfuzosin on 07/02/2022.  His Foley catheter was removed on 07/08/2022 after a successful voiding trial.  He was seen in the emergency room several days later and found to have 350 mL on a bladder scan.  He refused placement of a Foley catheter.  Urine culture grew >100 K Pseudomonas and Enterococcus.  He was seen in the office on 07/13/2022.  Bladder scan at that time showed >465 mL.  A Foley catheter was replaced.  He was treated with Cipro. He was evaluated with cystoscopy on 07/29/2022.  He was found to have mild enlargement of the prostate without a median lobe and bladder trabeculations.  He was able to void following cystoscopy. At his visit on 08/06/2022, he had run out of his alfuzosin about 2 days prior.  He had been voiding without significant difficulty since the Foley was removed.  No dysuria or gross hematuria. PVR = 258 mL. Urine culture grew >100 K Pseudomonas.  He was treated with gentamicin IM x3 days, completing this on 08/14/2022.  He returns today for follow-up.  He continues on alfuzosin and finasteride daily.  He reports that he is voiding without difficulty.  No dysuria or gross hematuria. IPSS = 0 today.  Portions of the above documentation  were copied from a prior visit for review purposes only.  Allergies: Allergies  Allergen Reactions   Bactrim [Sulfamethoxazole-Trimethoprim] Shortness Of Breath    Patient states "got short winded"   Spiriva Respimat [Tiotropium Bromide Monohydrate] Rash   Ofev [Nintedanib]     PMH: Past Medical History:  Diagnosis Date   Arthritis    COPD (chronic obstructive pulmonary disease) (HCC)    Diabetes mellitus    Hypertension    Pneumonia    Thyroid disease     PSH: Past Surgical History:  Procedure Laterality Date   KNEE ARTHROSCOPY     NO PAST SURGERIES     RIGHT HEART CATH N/A 11/18/2020   Procedure: RIGHT HEART CATH;  Surgeon: Jolaine Artist, MD;  Location: [facility];  Service: Cardiovascular;  Laterality: N/A;   RIGHT/LEFT HEART CATH AND CORONARY ANGIOGRAPHY N/A 05/21/2020   Procedure: RIGHT/LEFT HEART CATH AND CORONARY ANGIOGRAPHY;  Surgeon: Martinique, Peter M, MD;  Location: [facility];  Service: Cardiovascular;  Laterality: N/A;    SH: Social History   Tobacco Use   Smoking status: Former    Packs/day: 0.50    Years: 10.00    Total pack years: 5.00    Types: Cigarettes    Quit date: 12/03/1984    Years since quitting: 37.7   Smokeless tobacco: Never   Tobacco comments:    Quit 40 years ago.   Substance  Use Topics   Alcohol use: No   Drug use: No    ROS: Constitutional:  Negative for fever, chills, weight loss CV: Negative for chest pain, previous MI, hypertension Respiratory:  Negative for shortness of breath, wheezing, sleep apnea, frequent cough GI:  Negative for nausea, vomiting, bloody stool, GERD  PE: BP 120/71   Pulse 99   Ht 5' 6" (1.676 m)   Wt 158 lb (71.7 kg)   BMI 25.50 kg/m  GENERAL APPEARANCE:  Well appearing, well developed, well nourished, NAD HEENT:  Atraumatic, normocephalic, oropharynx clear NECK:  Supple without lymphadenopathy or thyromegaly ABDOMEN:  Soft, non-tender, no masses EXTREMITIES:  Moves all  extremities well, without clubbing, cyanosis, or edema NEUROLOGIC:  Alert and oriented x 3, normal gait, CN II-XII grossly intact MENTAL STATUS:  appropriate BACK:  Non-tender to palpation, No CVAT SKIN:  Warm, dry, and intact   Results: U/A: 6-10 WBCs, 0-2 RBCs, few bacteria, nitrite negative  PVR: 8 mL

## 2022-08-27 ENCOUNTER — Ambulatory Visit: Payer: Medicare Other | Admitting: Urology

## 2022-09-01 DIAGNOSIS — Z23 Encounter for immunization: Secondary | ICD-10-CM | POA: Diagnosis not present

## 2022-09-02 DIAGNOSIS — R0902 Hypoxemia: Secondary | ICD-10-CM | POA: Diagnosis not present

## 2022-10-01 ENCOUNTER — Ambulatory Visit (INDEPENDENT_AMBULATORY_CARE_PROVIDER_SITE_OTHER): Payer: Medicare Other | Admitting: Urology

## 2022-10-01 ENCOUNTER — Encounter: Payer: Self-pay | Admitting: Urology

## 2022-10-01 VITALS — BP 119/60 | HR 94 | Ht 66.0 in | Wt 158.0 lb

## 2022-10-01 DIAGNOSIS — Z87440 Personal history of urinary (tract) infections: Secondary | ICD-10-CM | POA: Diagnosis not present

## 2022-10-01 DIAGNOSIS — R8290 Unspecified abnormal findings in urine: Secondary | ICD-10-CM | POA: Diagnosis not present

## 2022-10-01 DIAGNOSIS — N401 Enlarged prostate with lower urinary tract symptoms: Secondary | ICD-10-CM | POA: Diagnosis not present

## 2022-10-01 DIAGNOSIS — N138 Other obstructive and reflux uropathy: Secondary | ICD-10-CM | POA: Diagnosis not present

## 2022-10-01 LAB — URINALYSIS, ROUTINE W REFLEX MICROSCOPIC
Bilirubin, UA: NEGATIVE
Glucose, UA: NEGATIVE
Ketones, UA: NEGATIVE
Nitrite, UA: POSITIVE — AB
Protein,UA: NEGATIVE
Specific Gravity, UA: 1.01 (ref 1.005–1.030)
Urobilinogen, Ur: 0.2 mg/dL (ref 0.2–1.0)
pH, UA: 5.5 (ref 5.0–7.5)

## 2022-10-01 LAB — BLADDER SCAN AMB NON-IMAGING: Scan Result: 82

## 2022-10-01 LAB — MICROSCOPIC EXAMINATION: WBC, UA: >30 /hpf — AB (ref 0–5)

## 2022-10-01 MED ORDER — CIPROFLOXACIN HCL 500 MG PO TABS: 500.0000 mg | ORAL_TABLET | Freq: Two times a day (BID) | ORAL | 0 refills | Status: DC

## 2022-10-01 NOTE — Progress Notes (Signed)
post void residual =18m

## 2022-10-01 NOTE — Progress Notes (Signed)
Assessment: 1. BPH with urinary obstruction   2. History of UTI   3. Abnormal urine findings     Plan: Urine culture sent today. Begin Cipro 500 mg BID x 7 days.  Rx sent. Continue alfuzosin and finasteride Return to office in 2 months  Chief Complaint: Chief Complaint  Patient presents with   Benign Prostatic Hypertrophy    HPI: [name] is a 76 y.o. male who presents for continued evaluation of urinary retention and BPH with obstruction.  He initially presented to the emergency room on 06/24/2022 following 2 days of difficulty voiding.  A Foley catheter was placed with return of 800 mL.  He had a normal creatinine and an unremarkable urinalysis.  He was not started on any BPH meds at that time.  He was started on alfuzosin on 07/02/2022.  His Foley catheter was removed on 07/08/2022 after a successful voiding trial.  He was seen in the emergency room several days later and found to have 350 mL on a bladder scan.  He refused placement of a Foley catheter.  Urine culture grew >100 K Pseudomonas and Enterococcus.  He was seen in the office on 07/13/2022.  Bladder scan at that time showed >465 mL.  A Foley catheter was replaced.  He was treated with Cipro. He was evaluated with cystoscopy on 07/29/2022.  He was found to have mild enlargement of the prostate without a median lobe and bladder trabeculations.  He was able to void following cystoscopy. At his visit on 08/06/2022, he had run out of his alfuzosin about 2 days prior.  He had been voiding without significant difficulty since the Foley was removed.  No dysuria or gross hematuria. PVR = 258 mL. Urine culture grew >100 K Pseudomonas.  He was treated with gentamicin IM x3 days, completing this on 08/14/2022. At his visit on 08/20/22, he continued on alfuzosin and finasteride daily.  He was voiding without difficulty.  No dysuria or gross hematuria. IPSS = 0.  PVR = 8 mL.  He returns today for follow-up.  He continues on alfuzosin and  finasteride.  He reports that his lower urinary tract symptoms are stable.  He does have occasional urgency.  He also reports nocturia 3-4 times.  No dysuria or gross hematuria. IPSS = 9 today.  Portions of the above documentation were copied from a prior visit for review purposes only.  Allergies: Allergies  Allergen Reactions   Bactrim [Sulfamethoxazole-Trimethoprim] Shortness Of Breath    Patient states "got short winded"   Spiriva Respimat [Tiotropium Bromide Monohydrate] Rash   Ofev [Nintedanib]     PMH: Past Medical History:  Diagnosis Date   Arthritis    COPD (chronic obstructive pulmonary disease) (HCC)    Diabetes mellitus    Hypertension    Pneumonia    Thyroid disease     PSH: Past Surgical History:  Procedure Laterality Date   KNEE ARTHROSCOPY     NO PAST SURGERIES     RIGHT HEART CATH N/A 11/18/2020   Procedure: RIGHT HEART CATH;  Surgeon: Jolaine Artist, MD;  Location: [facility];  Service: Cardiovascular;  Laterality: N/A;   RIGHT/LEFT HEART CATH AND CORONARY ANGIOGRAPHY N/A 05/21/2020   Procedure: RIGHT/LEFT HEART CATH AND CORONARY ANGIOGRAPHY;  Surgeon: Martinique, Peter M, MD;  Location: [facility];  Service: Cardiovascular;  Laterality: N/A;    SH: Social History   Tobacco Use   Smoking status: Former    Packs/day: 0.50  Years: 10.00    Total pack years: 5.00    Types: Cigarettes    Quit date: 12/03/1984    Years since quitting: 37.8   Smokeless tobacco: Never   Tobacco comments:    Quit 40 years ago.   Substance Use Topics   Alcohol use: No   Drug use: No    ROS: Constitutional:  Negative for fever, chills, weight loss CV: Negative for chest pain, previous MI, hypertension Respiratory:  Negative for shortness of breath, wheezing, sleep apnea, frequent cough GI:  Negative for nausea, vomiting, bloody stool, GERD  PE: BP 119/60   Pulse 94   Ht _0  (1.676 m)   Wt 158 lb (71.7 kg)   BMI 25.50 kg/m  GENERAL  APPEARANCE:  Well appearing, well developed, well nourished, NAD HEENT:  Atraumatic, normocephalic, oropharynx clear NECK:  Supple without lymphadenopathy or thyromegaly ABDOMEN:  Soft, non-tender, no masses EXTREMITIES:  Moves all extremities well, without clubbing, cyanosis, or edema NEUROLOGIC:  Alert and oriented x 3, normal gait, CN II-XII grossly intact MENTAL STATUS:  appropriate BACK:  Non-tender to palpation, No CVAT SKIN:  Warm, dry, and intact   Results: U/A: >30 WBC, 0-2 RBC, few bacteria, nitrite +  PVR = 82 ml

## 2022-10-02 DIAGNOSIS — R0902 Hypoxemia: Secondary | ICD-10-CM | POA: Diagnosis not present

## 2022-10-05 ENCOUNTER — Telehealth: Payer: Self-pay

## 2022-10-05 LAB — URINE CULTURE

## 2022-10-05 NOTE — Telephone Encounter (Signed)
Patient aware of MD response to urine culture and voiced understanding.

## 2022-10-05 NOTE — Telephone Encounter (Signed)
-----  Message from Primus Bravo, MD sent at 10/05/2022 12:18 PM EST ----- Please notify patient to complete Cipro x 7 days for UTI.

## 2022-10-06 DIAGNOSIS — M0589 Other rheumatoid arthritis with rheumatoid factor of multiple sites: Secondary | ICD-10-CM | POA: Diagnosis not present

## 2022-10-06 DIAGNOSIS — Z1159 Encounter for screening for other viral diseases: Secondary | ICD-10-CM | POA: Diagnosis not present

## 2022-10-15 ENCOUNTER — Telehealth: Payer: Self-pay | Admitting: Pharmacist

## 2022-10-15 ENCOUNTER — Ambulatory Visit: Payer: Medicare Other | Admitting: Internal Medicine

## 2022-10-15 ENCOUNTER — Telehealth: Payer: Self-pay

## 2022-10-15 NOTE — Progress Notes (Deleted)
OV 01/01/2017  Chief Complaint  Patient presents with   Follow-up    Pt here after PFT. Pt states overall his breathing is unchanged since last OV. Pt c/o occ dry cough. Pt deneis CP/tightness and f/c/s.    Follow-up interstitial lung disease UIP pattern associated with rheumatoid arthritis and immune suppression. Last visit in March 2017. He doesn't that overall he has been stable. He has a new rheumatologist but still at [facility] Dr [name]. His wife also says he is stable. However as he started talking to them his wife slowly started telling me that for the last few to several months he is wheezing more than usual and is a little more short of breath than usual. However he denied it. This is compliant with his medications. [name] not on any inhaler therapy. He uses oxygen only at night. Previous CT scans of the chest in September 2016 in March 2017 did not report any associated emphysema. His most recent pulmonary function test is shown that on Combivent. I personally visualized the graph: [name] Kitchen Forced vital capacity is stable with a diffusion capacity seems to decline over time. Walking desaturation test 185 feet 3 laps on room air: He desaturated to 84%. And then we walked him on 2 L oxygen he did not desaturate.   OV 06/23/2017  Chief Complaint  Patient presents with   Follow-up    pt here after CT in 04/2017. Pt states his breathing is unchanged since last OV. Pt c/o interemittent dry cough. Pt denies CP/tightness and f/c/s.      Follow-up interstitial lung disease UIP pattern associated with rheumatoid arthritis and on Imuran. Last visit in March 2018 I was concerned about progressive interstitial lung disease because he started desaturating on room air and his FVC show decline. Currently rated a high-resolution CT chest that confirmed progressive lung disease. We walked him 185 feet 3 laps on room air and he desaturated at third lap to 89%. This is very  similar to previous visit suggesting that  that his decline has been more gradual over the last 1 year. Compared to last visit he feels stable. His wife feels the same. [name] aware of his worsening fibrosis and the potential need to change anti fibrotics  OV 09/27/2017  Chief Complaint  Patient presents with   Follow-up    PFT done today. Pt states that he thinks his breathing has become worse. Has complaints of SOB with exertion, occ. dry cough. Denies any CP. States that he does wear O2 at night, DME: [facility].     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  Patient is on Imuran.  [name] having progressive disease this year 2018.  He told my certified medical assistant that he is having progressive shortness of breath but to me he tells me that he is stable.  As always he is a very poor historian.  Wife gives a better history.  He has oxygen at home for the night which she uses but does not use his exertional small portable system even though this helps him.  They do admit that dyspnea is progressive.  In fact pulmonary function test today shows progressive disease.  He also seem to desaturate a little bit more easily.  He continues to be on Imuran.  In talking to him it appears his rheumatologist has now started him on Rituxan.  He has had 2 doses 2 weeks apart with the most  recent one being mid November 2018.  The next dose is at mid May 2019 which is 6-monthinterval.  He says he is up-to-date with his flu shot but is not had a conversation with his primary care physician about the new shingles vaccine that is inactivated vaccine.  [name] not having much of joint issues.  [name] aware that the Rituxan is specifically designed to slow down the progression of pulmonary fibrosis.  Walking desaturation test 185 feet x3 laps on room air with a forehead probe.  Resting heart rate was 95/min.  Final heart rate 108/min.  Resting pulse ox was 95%.  By the time he  finished his second lap he desaturated to 84%.  He was then placed on 2 L nasal cannula and he was able to maintain his saturation to 95%.   OV 12/20/2017  Chief Complaint  Patient presents with   Follow-up    follow up PFT,SOB with activity/exertion   [name] is here for follow-up because of his progressive UIP pattern pulmonary fibrosis related to rheumatoid arthritis.  At his last visit he was started on Rituxan by rheumatology.  Since then we started him on portable oxygen but he says he is not using it because he does not like to use it.  He does use oxygen at night which is helping.  His next dose of Rituxan is in May 2019.  Both he and his wife attest that since the start of Rituxan his symptoms are stable particularly shortness of breath is not any worse.  In fact pulmonary function test today shows continued ongoing stability with FVC and DLCO would suggest that the Rituxan might be helping him.  There are no new issues.  [name] here to have a shingles vaccine.  I told him about this last visit to talk to primary care physician but he does not remember this.   IMPRESSION: June 2018 1. Continued interval progression of fibrotic interstitial lung disease with extensive honeycombing, asymmetrically involving the right lung, with mild basilar predominance, diagnostic of usual interstitial pneumonia (UIP) . 2. Stable mild reactive mediastinal lymphadenopathy. 3. Aortic atherosclerosis.  One vessel coronary atherosclerosis.     Electronically Signed   By: [name]   On: 04/09/2017 10:51   OV 08/01/2018  Subjective:  Patient ID: [name] , DOB: 6Apr 05, 1947, age 76y.o. , MRN: [id number], [place] [id number]  08/01/2018 -   Chief Complaint  Patient presents with   Follow-up    PFT today, increased coughing (non-productive)    Follow-up interstitial lung disease UIP pattern associated with rheumatoid arthritis. Rx Rituxan since approx  Oct/Nov 2018. Background Immuran  HPI [name] 76y.o. -presents as usual with his wife.  He tells me that he continues on background Imuran.  [name] also on scheduled Rituxan.  He has an infusion coming up in a month or so.  This is according to his wife.  He continues to be a poor historian.  As best as I can gather from both of them is that his shortness of breath is actually a little bit better ever since starting Rituxan..  In fact his PFTs show continued improvement/stability.  With his FVC and DLCO.  However he tells me that his cough might be a little bit worse.  In talking to him it appears that is only mildly worse.  It happens mostly at night when he lies down.  He  coughs a few times and then it does not bother him.  The cough is dry.  He denies any postnasal drip or acid reflux disease.  He does not have any fever or sputum production.       OV 10/06/2018  Subjective:  Patient ID: [name], male , DOB: 1946/09/22 , age 48 y.o. , MRN: [id number] , ADDRESS: [place] [id number]   10/06/2018 -   Chief Complaint  Patient presents with   Follow-up    f/u pulmonary fibrosis, no symptoms, complaints, breathing doind well     HPI [name] 76 y.o. -returns for ILD follow-up in the setting of rheumatoid arthritis.  This time he is not here with his wife who has other schedule conflicts.  He tells me overall he stable.  He has now started Rituxan.  As again he is a poor historian.  I have him starting Rituxan a year ago but he told me that it was only started recently and has had only 1 infusion.  He tells me that he is stable.  His cough is well controlled with this anticholinergic he is using nighttime oxygen.  He says he is compliant with his medications.  Last visit I told him to inquire about using Bactrim prophylaxis in the setting of B-cell depletion in the setting of Rituxan.  He has no idea recollection of this conversation.  So I did tell him that we will check  his G6PD and discuss directly with his rheumatologist about starting Bactrim prophylaxis.  [name] okay with this plan.  Overall dyspnea and cough are stable and it is mild.  Walking desaturation test shows stability.     OV 06/29/2019  Subjective:  Patient ID: [name], male , DOB: 12/01/1945 , age 40 y.o. , MRN: [id number] , ADDRESS: [address] [place] [address]   Follow-up interstitial lung disease UIP pattern associated with rheumatoid arthritis. Rx Rituxan since approx Oct/Nov 2018. On Background Immuran, prednisone and dapsone for pcp prophylaxois = high risk medication use   06/29/2019 - ILD- RA  followup    HPI Diamonte Brashier 76 y.o. -presents for follow-up.  I personally saw him December 2019 before the onset of the pandemic.  He tells me that he is doing fair.  He feels he is overall stable.  He continues to be on Rituxan.  He tells me the last dose of this infusion was in May 05, 2019.  This is managed by rheumatology.  In addition he is on Imuran, prednisone.  Also by middle of year my nurse practitioner started him on dapsone for PCP prophylaxis.  He did have a high-resolution CT chest that shows UIP findings and stability since 2018.  However, he did desaturate with exertion when he saw the nurse practitioner just over a month ago.  This is a new finding as documented below.  We walked him again today and again found his desaturation with exertion.  It required 5 L to correct.  In past he has done well with RA at times but at times required 2L. He denies any fever or sputum production.  His previous echocardiogram was in 2013.  Most recent CT scan of the chest notes coronary artery calcification   Noted WBC going down after dapsine start Results for HOLTEN, SPANO (MRN [id number]) as of 06/29/2019 10:19  Ref. Range 05/30/2019 10:17 06/07/2019 09:07 06/13/2019 08:59 06/20/2019 09:50  WBC Latest Ref Range: 4.0 - 10.5 K/uL 8.8 6.2 4.3 3.4 (  L)     IMPRESSION: HRCT July 2020 1. No  significant interval change in severe pulmonary fibrosis, which is heterogeneously distributed and most severe in the right lung base, featuring extensive honeycombing and mild traction bronchiectasis. Although findings are not significantly changed compared to most recent examination dated 2018, they are clearly progressed over time in comparison to priors dating back to 2013. Findings remain in a UIP pattern by ATS pulmonary fibrosis criteria   2.  Coronary artery disease.     Electronically Signed   By: Eddie Candle [name]   On: 05/12/2019 13:14  ROS - per HPI   OV 01/04/2020  Subjective:  Patient ID: [name], male , DOB: [birth year] , age 76 y.o. , MRN: [id number] , [address] [place] [address]   01/04/2020 -   Chief Complaint  Patient presents with   Follow-up    Pt states he has been doing good since last visit. States he still has an occ cough.    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 Rx Rituxan since approx Oct/Nov 2018. On Background Immuran, prednisone and  = high risk medication use     HPI [name] 76 y.o. -returns for follow-up.  Last seen in the summer 2020.  Overall he is doing stable.  He continues with his immunosuppressive medications through rheumatology.  This include Imuran prednisone and Rituxan.  He had his infusion of Rituxan this week.  He feels stable but his pulmonary function test done for this visit shows a decline.  It is documented below his walking desaturation test also is a decline from the past but similar to last visit 6 months or so ago.  His main symptom is chronic cough.  [name] labeled it as a level 3 out of 5 but it appears it might actually be more severe.  [name] asking for palliative relief of cough.  His last liver function test with [place] was in the summer 2020.  It appears per review of the chart in the interim he has seen cardiology Dr. Percival Spanish for his coronary artery  calcification and is on expectant follow-up for that.  No stress test has been recommended because of the lack of chest pain.  His wife who is usually here with him is not here today.  He said she slept in.  [name] on MDI - no emphysema noted in prior CT    ROS - per HPI  OV 02/15/2020  Subjective:  Patient ID: [name], male , DOB: 15-Jan-1946 , age 75 y.o. , MRN: [id number] , ADDRESS: [place] [address]   02/15/2020 -   Chief Complaint  Patient presents with   Follow-up    Follow-up interstitial lung disease UIP pattern associated with rheumatoid arthritis.   - Rx Rituxan since approx Oct/Nov 2018. On Background Immuran, prednisone and  = high risk medication use  - started ofev mid -  end of march 2021 for progressive disease    HPI [name] 76 y.o. -he has progressive ILD because of rheumatoid arthritis UIP pattern.  Last visit started him on nintedanib.  [name] now here to follow-up for tolerance and monitoring of nintedanib because this requires intensive therapeutic monitoring.  [name] also immunosuppressed.  He tells me that he is now started nintedanib approximately 1 month ago.  [name] tolerating it fine without any nausea vomiting diarrhea.  Last visit he took the consent [name]  for the ILD-pro registry study.  [name] agreed now to participate in it.  He meets inclusion exclusion criteria because of progressive disease.  At this point in time he is got minimal symptoms.  He desaturated as before.  However he is not compliant with his portable oxygen with exertion.  His cough is improved because of Hycodan.   OV 05/01/2020  Subjective:  Patient ID: [name], male , DOB: 03-19-1946 , age 20 y.o. , MRN: [id number] , ADDRESS: [place] [id number]   05/01/2020 -   Chief Complaint  Patient presents with   Follow-up    pt is here to to disuss ild    OV 05/30/2020   Subjective:  Patient ID: [name], male , DOB:  1945-12-06, age 54 y.o. years. , MRN: [id number],  ADDRESS: [address] [place] [address] PCP  Merrilee Seashore, MD Providers : Treatment Team:  Attending Provider: Brand Males, MD   Chief Complaint  Patient presents with   Follow-up    Pt states he has been doing good since last visit and states his breathing is about the same.     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   - Rx Rituxan since approx Oct/Nov 2018. On Background Immuran, prednisone and  = high risk medication use  - started ofev mid -  end of march 2021 for progressive disease -> changed to [facility]  June 2021    HPI [name] 76 y.o. -presents for follow-up.  This time he has brought his wife.  Have not seen the wife in a long time.  She appears to have lost weight.  She says she is dealing with her own health issues of unexplained weight loss.  She says she is abreast of his health issues when he returns home after visits.  However patient continues to be stoic and always states he is feeling well.  Last time he said he was feeling well but had high level of symptoms.  This time also he says he is feeling well and gaining weight and has marked very low level of symptoms.  Therefore overall his history is unreliable.  In the interim he has had his right heart catheterization.  He had nonobstructive coronary artery disease his.  His mean pulmonary artery pressure was elevated at 25.  Wedge pressure could not be elucidated.  His peripheral vascular resistance appears greater than 3.  Therefore he qualifies for nebulized treprostinil.  However he still in the uptake with his pirfenidone.  [name] not applying sunscreen and he says he will.  [name] tolerating pirfenidone well.  He has gained weight no nausea no vomiting no diarrhea.  He uses 4 L of pulsed oxygen at rest.  However at room air at rest he was normal.  He desaturated only after walking 2 laps.    OV  08/28/2020   Subjective:  Patient ID: [name], male , DOB: December 03, 1945, age 89 y.o. years. , MRN: [id number],  ADDRESS: [address] [place] [address] PCP  Merrilee Seashore, MD Providers : Treatment Team:  Attending Provider: Brand Males, MD Patient Care Team: Merrilee Seashore, MD as PCP - General (Internal Medicine) Hennie Duos, MD as Consulting Physician (Rheumatology) Lauraine Rinne, NP as Nurse Practitioner (Pulmonary Disease)    Chief Complaint  Patient presents with   Follow-up    coughs when starts eating.      Follow-up interstitial lung disease UIP  pattern associated with rheumatoid arthritis.   - Rx Rituxan since approx Oct/Nov 2018. On Background Immuran, prednisone and  = high risk medication use  - started ofev mid -  end of march 2021 for progressive disease -> changed to [facility]  June 2021 -> stopped sept 2021 due to rash     HPI Takota Fullard 76 y.o. -returns for follow-up.  At this visit he was supposed to do spirometry and DLCO but he has not.  He presents by himself.  It is unclear why he did not do spirometry and DLCO.  As usual he is stoic and does not give much of a history.  His symptom score appears stable.  His weight appears stable.  He uses 4 L of oxygen.  Between the last 2 visits he made phone calls suggesting initially that the pirfenidone was causing a rash in his upper arm but later the phone call said Spiriva.  We indicated that he should stop the Spiriva.  Today he tells me that he is actually continuing his Spiriva.  He tells me that the rash was caused by the pirfenidone/Esbriet.  Initially did not seem sure it was because of the [place] but latter when I repeated questions in multiple different ways he indicated it was desperate causing the rash therefore he stopped it.  He has been 1 month since he stopped it and the rash is disappeared.  The rash was in a nonexposed area in the right upper extremity.  He has had his Covid  vaccine but has not had his booster.  He continues to be on Rituxan which is tolerating well.  I did indicate to him that in the absence of B cells he might not have antibodies and he should get his Covid booster.  We will check Covid IgG as follow-up post booster needs continued intensive monitoring due to high complex status and immunosuppression    OV 12/03/2020  Subjective:  Patient ID: [name], male , DOB: [birth year] , age 31 y.o. , MRN: [id number] , ADDRESS: [address] [place] [address] PCP Merrilee Seashore, MD Patient Care Team: Merrilee Seashore, MD as PCP - General (Internal Medicine) Minus Breeding, MD as PCP - Cardiology (Cardiology) Hennie Duos, MD as Consulting Physician (Rheumatology) Lauraine Rinne, NP as Nurse Practitioner (Pulmonary Disease)  This Provider for this visit: Treatment Team:  Attending Provider: Brand Males, MD    12/03/2020 -   Chief Complaint  Patient presents with   Follow-up    F/U after hospital stay. States he has been feeling well since being home.      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   - Rx Rituxan since approx Oct/Nov 2018. On Background Immuran, prednisone and  = high risk medication use  - started ofev mid -  end of march 2021 for progressive disease -> changed to [facility]  June 2021 -> stopped sept 2021 due to rash  -Participant in ILD-pro registry study   Pulm HTn [place] 11/18/20 v- Dr Glori Bickers  Repeat [place] yesterday c/w mild to moderate pulmonary HTN with high cardiac output. PA = 59/18 (34). PCW = 10. PVR = 3.1  - Given PAH in setting of IPF, a trial of inhaled treprostinil seems reasonable    12/03/2020 [name] returns for follow-up.  After my last visit in October 2021 he decompensated in January 2022 and admitted to the hospital intensive care unit needing BiPAP for respiratory failure in  January 2022 last month.  I was in touch with the intensive  care physician Dr. Doyne Keel and heart failure specialist Dr. Haroldine Laws during this time.  They did a right heart catheterization in the results are above.  Patient does have WHO group 3 pulmonary hypertension.  Inhaled treprostinil is indicated.  Dr. Clayborne Dana pharmacist [facility] working on this.  I just touch base with Dr. Haroldine Laws about this.  Patient is here with his wife.  He says he is much improved.  [name] back to his 3 L nasal cannula at baseline.  He reports having had his Covid booster in October 2021.  He had his booster after we found that his Covid IgG was negative.  Of note he is on Rituxan which suppresses IgG production.  He and his wife are interested in future therapeutic options.  [name] a participant in the ILD-pro registry.        OV 02/27/2021  Subjective:  Patient ID: [name], male , DOB: November 27, 1945 , age 32 y.o. , MRN: [id number] , ADDRESS: [address] [place] [address] PCP Merrilee Seashore, MD Patient Care Team: Merrilee Seashore, MD as PCP - General (Internal Medicine) Minus Breeding, MD as PCP - Cardiology (Cardiology) Hennie Duos, MD as Consulting Physician (Rheumatology) Lauraine Rinne, NP as Nurse Practitioner (Pulmonary Disease)  This Provider for this visit: Treatment Team:  Attending Provider: Brand Males, MD    02/27/2021 -   Chief Complaint  Patient presents with   Follow-up    Doing ok, breathing is stable     HPI Belton Dina 76 y.o. -returns for follow-up.  He presents with his wife.  He endorses stability.  His symptom score shows worsening but I suspect this is unreliable because he never gives a good history.  He had pulmonary function test that shows improvement compared to the most recent 1 but still worse compared to February 2021.  In the interim he has been started on inhaled treprostinil as of March 2022.  There is some subset analysis evidence to suggest treprostinil can modulte PFTs [and is the question being  raised in the upcoming tETON trial and for IPF] he is on his immunomodulator treatment is finished COVID prophylaxis monoclonal antibody.  [name] able to participate today in the ILD Pro registry visit.      OV 06/30/2021  Subjective:  Patient ID: [name], male , DOB: May 17, 1946 , age 67 y.o. , MRN: [id number] , ADDRESS: [address] [place]  [address] PCP Merrilee Seashore, MD Patient Care Team: Merrilee Seashore, MD as PCP - General (Internal Medicine) Minus Breeding, MD as PCP - Cardiology (Cardiology) Hennie Duos, MD as Consulting Physician (Rheumatology) Lauraine Rinne, NP as Nurse Practitioner (Pulmonary Disease)  This Provider for this visit: Treatment Team:  Attending Provider: Melvenia Needles, NP    06/30/2021 -   Chief Complaint  Patient presents with   Follow-up    Pt states coughing.    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   - Rx Rituxan since approx Oct/Nov 2018. On Background Immuran, prednisone and  = high risk medication use  - started ofev mid -  end of march 2021 for progressive disease -> changed to [facility]  June 2021 -> stopped sept 2021 due to rash -> not on anti-foibritc  -Participant in ILD-pro registry study   Pulm HTn [place] 11/18/20 - Dr Glori Bickers  Repeat [place] yesterday c/w mild to moderate pulmonary  HTN with high cardiac output. PA = 59/18 (34). PCW = 10. PVR = 3.1  -  started tyvaso early march 2022 - through [facility] Riles - CHF Pharmacist  Associated mild emphysema [previous intolerance to  Spiriva)  - feb 2022 On inceuse  COvid prophylassix (IgG negative Feb 2022)  - tixagevimab/cilgavimab, for pre-exposure prophylaxis  Feb 2022 -= repeat dosing March 2022  HPI [name] 76 y.o. -returns for follow-up.  He presents with his wife.  He tells me that overall he is doing stable.  [name] not on antifibrotic's symptom score shows stability.  Pulmonary function test done?  Declining  versus stable.  See below.  [name] using 4 L of oxygen.  Wife states his cough is worse.  He then acknowledged that his cough is worse.  Wife rated as a 5 out of 5.  He says it does not affect his quality of life.  As always there is a discrepancy between his history and her history.  Nevertheless she feels it significant.  The sputum is clear.  There is no wheezing associated with this.  He has tried [employer] and other over-the-counter treatments but these have not helped.  We discussed doing Hycodan opioid cough relief for palliative management of cough in the setting of pulmonary fibrosis.  [name] open to this idea.  [name] taking inhaled Tyvaso for his pulmonary hypertension 4 times a day each time 9 puffs.  He feels it is helping.  At this point he is maxed out on therapy.  [name] doing prednisone, Imuran and Rituxan.  This for his rheumatoid arthritis.  If he declines he can always add CellCept.  [name] a participant in the research registry study.  His 78-monthvisit is anytime between now and November.  [name] willing to do his research visit today.   OV 11/24/2021  Subjective:  Patient ID: LAngelena [name] , DOB: 605-21-1947, age 76y.o. , MRN: [id number], ADDRESS: [address] [place] [id number] RMerrilee Seashore MD Patient Care Team: RMerrilee Seashore MD as PCP - General (Internal Medicine) HMinus Breeding MD as PCP - Cardiology (Cardiology) BHennie Duos MD as Consulting Physician (Rheumatology) MLauraine Rinne NP as Nurse Practitioner (Pulmonary Disease)  This Provider for this visit: Treatment Team:  Attending Provider: RBrand Males MD    11/24/2021 -   Chief Complaint  Patient presents with   Follow-up    F/U after SArlyce Harman States he has been stable since last visit. No new symptoms. Still using 4L of O2.     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   - Rx Rituxan since approx Oct/Nov 2018. On Background  Immuran, prednisone and  = high risk medication use  - started ofev mid -  end of march 2021 for progressive disease -> [facility] -> stopped sept 2021 due to rash -> not on anti-foibritc  -Participant in ILD-pro registry study   [facility] Dr [place]i [place] c/w mild to moderate pulmonary HTN with high cardiac output. PA = 59/18 (34). PCW = 10. PVR = 3.1  -  started tyvaso early march 2022 - through LAudry Riles- CHF Pharmacist  Associated mild emphysema [previous intolerance to  Spiriva)  - feb 2022 On inceuse  COvid prophylassix (IgG negative Feb 2022)  - tixagevimab/cilgavimab, for pre-exposure prophylaxis  Feb 2022 - repeat dosing March  2022  Chronic cough on -Hycodan opioid  HPI Markeese Berrie 76 y.o. -presents for routine follow-up.  At this point in time he is Rituxan, Imuran and prednisone through the rheumatologist.  [name] on high risk prescription.  [name] not on any antifibrotic's because of side effects.  [name] getting inhaled treprostinil nebulizer through cardiology for his WHO group 3 pulmonary hypertension.  For his associated emphysema is run out of Incruse.  His cough is well controlled but for the last few weeks he is having increased yellow sputum but there is no change in his baseline wheeze which he continues to have intermittently.  Overall he states his symptoms are stable.  However on the symptom score below symptoms have progressed but again I doubt his accuracy and answering these questions.  [name] participant in ILD-Pro registry.  According to the research assistant next visit is only in April 2023.  He had pulmonary function test today and shows continued stability and personal interpretation.  Recently but overlal progression.         OV 02/24/2022  Subjective:  Patient ID: [name], male , DOB: 1946/03/22 , age 41 y.o. , MRN: [id number] , ADDRESS: [address] [place] [address] PCP Merrilee Seashore, MD Patient Care Team: Merrilee Seashore, MD as PCP - General (Internal Medicine) Minus Breeding, MD as PCP - Cardiology (Cardiology) Hennie Duos, MD as Consulting Physician (Rheumatology) Lauraine Rinne, NP as Nurse Practitioner (Pulmonary Disease)  This Provider for this visit: Treatment Team:  Attending Provider: Brand Males, MD    02/24/2022 -   Chief Complaint  Patient presents with   Follow-up    Pt states he is about the same since last visit. Still becomes SOB with exertion.  id  HPI [name] 76 y.o. -returns for his 65-monthfollow-up.  He says overall he is doing "fair".  He states he is stable.  [name] using 4 L nasal cannula.  His symptom score shows stability.  He continues to use treprostinil but this time is using a DPI.  He says he is tolerating it well except he has cough but the cough is severe although it is quite tolerable and is able to manage.  He would rather deal with the cough then to give up inhaled treprostinil.  He continues immunomodulators of Rituxan which he is due for next month, Imuran and also prednisone.  [name] continue to lose weight but he says current weight is satisfactory.  [name] a positive ILD-Pro registry visit but I do not know the schedule for that.  Overall he is stable.    05/13/2022 Acute OV : Weight loss, RA-ILD  Patient presents for a work in visit.  Patient complains that over the last 3 months his weight continues to go down he is down another 10 pounds.  He has increased fatigue decreased activity tolerance and shortness of breath. Complains of increased cough, congestion with thick yellow mucus . No body aches or fever. No increased joint pain . No joint swelling or redness . Low appetite, no desire to eat. No abdominal pain, n/v/d. No dysuria . No hemoptysis.  Patient is followed for rheumatoid arthritis associated interstitial lung disease with UIP pattern.  He has chronic respiratory failure on home oxygen.  [name]  followed by rheumatology is on Imuran and low-dose prednisone.  Prednisone dose is at 3 mg daily.  Patient is had no increased oxygen demands.  Remains on 4 L of  oxygen.  Patient has been intolerant to antifibrotic's in the past.  He has ILD associated pulmonary hypertension.  [name] on Tyvaso. Says doing well  on it without headache or dizziness.  No recent labs or xray. Last CT chest November 15, 2020 shows trace left pleural effusion extensive patchy groundglass opacity and consolidation throughout both lungs superimposed on chronic patchy reticulation and moderate traction bronchiectasis with extensive honeycombing and architectural distortion findings suggestive of a multi lobar pneumonia superimposed on previously documented UIP.   OV 06/18/2022  Subjective:  Patient ID: [name], male , DOB: 10-Jan-1946 , age 76 y.o. , MRN: [id number] , ADDRESS: [address] [place] [address] PCP Merrilee Seashore, MD Patient Care Team: Merrilee Seashore, MD as PCP - General (Internal Medicine) Minus Breeding, MD as PCP - Cardiology (Cardiology) Hennie Duos, MD as Consulting Physician (Rheumatology) Lauraine Rinne, NP as Nurse Practitioner (Pulmonary Disease)  This Provider for this visit: Treatment Team:  Attending Provider: Brand Males, MD    06/18/2022 -   Chief Complaint  Patient presents with   Follow-up    PFT performed today.  Pt states his breathing has become worse since last visit. States that he is becoming short-winded quicker. States that he has been coughing a lot.    HPI [name] 76 y.o. -presents with wife. Initially said he is "fair  ok": but he has lost more weight. Needing 4L [place] . On Rituxan (last June 2023), prednisone and tyvaso. He did admit to weight loss. Also saying more dyspneic. Cannot do groceries from his car to house anymore but can do other ADLs in house. He feels anti-fibrotics made him worse. Had CT and PFT - personally visualized and showe him  CT - both are getting worse.D/w Dr Kathlene November his rheum  he is supportive of my idea to change rituxan to actemra but wants [place] to do it     OV 10/15/2022  Subjective:  Patient ID: [name], male , DOB: 14-Jun-1946 , age 85 y.o. , MRN: [id number] , ADDRESS: [place] [address] PCP Merrilee Seashore, MD Patient Care Team: Merrilee Seashore, MD as PCP - General (Internal Medicine) Minus Breeding, MD as PCP - Cardiology (Cardiology) Hennie Duos, MD as Consulting Physician (Rheumatology) Lauraine Rinne, NP as Nurse Practitioner (Pulmonary Disease)  This Provider for this visit: Treatment Team:  Attending Provider: Brand Males, MD  Follow-up interstitial lung disease UIP pattern associated with rheumatoid arthritis.   - Rx Rituxan since approx Oct/Nov 2018.  - On immuran,  -On prednisone and  = high risk medication use  - started ofev mid -  end of march 2021 for progressive disease -> changed to [facility]  June 2021 -> stopped sept 2021 due to rash -> not on anti-foibritc  -Participant in ILD-pro registry study   Pulm HTn [place] 11/18/20 - Dr Quillian Quince Bensimhon  Repeat [place]  c/w mild to moderate pulmonary HTN with high cardiac output. PA = 59/18 (34). PCW = 10. PVR = 3.1  -  started tyvaso early march 2022 - through Audry Riles - CHF Pharmacist  Associated mild emphysema [previous intolerance to  Spiriva)  - feb 2022 On inceuse  COvid prophylassix (IgG negative Feb 2022)  - tixagevimab/cilgavimab, for pre-exposure prophylaxis  Feb 2022 - repeat dosing March 2022  Chronic cough on -Hycodan opio   10/15/2022 -  No chief complaint on file.    HPI [name] 76 y.o. -  SYMPTOM SCALE - ILD 06/29/2019  01/04/2020  02/15/2020  05/01/2020  05/30/2020  08/28/2020  02/27/2021 On tyvasos 06/30/2021  11/24/2021 tyvaso 02/24/2022  06/18/2022 Tyvaso, rituxan, prednisone  O2 use Uses 3-5L with exertion yuses 3-5L with exertion Non compliant 168# and 4L  with exertion - on esbriet 172# on esbriet - 4L at rest  - filled bu him and wife 174# 4L o2 172# 4L O2  169#. 4L 165# 157#  Shortness of Breath 0 -> 5 scale with 5 being worst (score 6 If unable to do)            At rest _0 4  Simple tasks - showers, clothes change, eating, shaving _1 3  Household (dishes, doing bed, laundry) 2 1 0 5 0  0 _2 Shopping _3 0 0 0 _4 Walking level at own pace _5 0 0 _6 Walking up Stairs _7 Total (40 - 48) Dyspnea Score _8 How bad is your cough? _9 How bad is your fatigue _10   nasuea    4 0 0 0 _11 vomit    0 0 0 0 00 0 0 0  diarrhea  _12  anxiety  _13  deipression  _14  0     Simple office walk 08/01/2018 -  185 feet x  3 laps goal with forehead probe at [place]  10/06/2018 250 feet x 3 laps 06/29/2019  01/04/2020  02/15/2020  05/30/2020  12/03/2020 Using 3L [place] at home all time 02/27/2021 Uses 4L at home 02/24/2022 4L pulses  O2 used _15  raoom air after turing o2 off fx 5 mn Room air Room air 4L   Number laps completed 3 3 Aim for 3 1 Did only 1 of 3 Did all 3 Did only 2 laps Did only 0.5 lap desats at 0.5 of 3 l;aps Didd n1 lap  Comments about pace normal normal  avg pace avg opae avg pagce avg pace  avg  Resting Pulse Ox/HR 100% and 80/min 100% and 81/min 94% and 96/min 96% and 101/min 99% and 98/min 96% and 94/min 96% and HR 96 89% RA -> 95 % 100% and HR 83  Final Pulse Ox/HR 94% and 114/min 91% and 111/min 86% at first lap, HR 131 86% and 118/min 88% and 123/min 83% and 125/,n 85% and HR 124 at half lap 87% at half lap 90% and HR 116  Desaturated </= 88% no no  yes yes    no  Desaturated <= 3% points yes yes  yes yes    yes  Got Tachycardic >/= 90/min yes yes  yes yes    yes  Symptoms at end of test x x  Mild  dyspnea dyspnea    Severe dyspnea stopeda  1 lap  Miscellaneous comments x x Needed 5L [place] to  correct    Corrected with 4L [place] and did noly 2 laps      PFT     Latest Ref Rng & Units 06/18/2022   10:10 AM 11/24/2021    8:54 AM 06/30/2021    1:20 PM 02/27/2021    9:55 AM 04/08/2020    9:49 AM 12/26/2019    8:54 AM 08/01/2018    9:39 AM  PFT Results  FVC-Pre L 1.53  1.78  1.76  1.85  1.73  1.96  2.09   FVC-Predicted Pre % 43  56  56  58  54  61  69   Pre FEV1/FVC % % 90  85  87  89  90  86  89   FEV1-Pre L 1.39  1.52  1.53  1.65  1.55  1.69  1.87   FEV1-Predicted Pre % 55  66  66  71  66  72  84   DLCO uncorrected ml/min/mmHg 5.01  6.59  7.62  10.01  8.10  13.14  13.24   DLCO UNC% % 22  29  34  45  36  58  52   DLCO corrected ml/min/mmHg 5.99  6.59  7.62  10.01  9.15     DLCO COR %Predicted % 27  29  34  45  41     DLVA Predicted % 46  46  55  67  68  88  95        has a past medical history of Arthritis, COPD (chronic obstructive pulmonary disease) ([place]), Diabetes mellitus, Hypertension, Pneumonia, and Thyroid disease.   reports that he quit smoking about 37 years ago. His smoking use included cigarettes. He has a 5.00 pack-year smoking history. He has never used smokeless tobacco.  Past Surgical History:  Procedure Laterality Date   KNEE ARTHROSCOPY     NO PAST SURGERIES     RIGHT HEART CATH N/A 11/18/2020   Procedure: RIGHT HEART CATH;  Surgeon: Jolaine Artist, MD;  Location: [facility];  Service: Cardiovascular;  Laterality: N/A;   RIGHT/LEFT HEART CATH AND CORONARY ANGIOGRAPHY N/A 05/21/2020   Procedure: RIGHT/LEFT HEART CATH AND CORONARY ANGIOGRAPHY;  Surgeon: Martinique, Peter M, MD;  Location: [facility];  Service: Cardiovascular;  Laterality: N/A;    Allergies  Allergen Reactions   Bactrim [Sulfamethoxazole-Trimethoprim] Shortness Of Breath    Patient states "got short winded"   Spiriva Respimat [Tiotropium Bromide Monohydrate] Rash   Ofev [Nintedanib]      Immunization History  Administered Date(s) Administered   Fluad Quad(high Dose 65+) 08/12/2020   Influenza Split 08/03/2011, 07/03/2012, 09/02/2013, 07/03/2014   Influenza, High Dose Seasonal PF 07/30/2014, 07/15/2016, 08/02/2016, 08/02/2017, 07/25/2018, 07/04/2019   Influenza, Quadrivalent, Recombinant, Inj, Pf 08/11/2017, 07/05/2018, 07/11/2019, 08/15/2020, 07/28/2021   Influenza,inj,Quad PF,6+ Mos 07/29/2015   Influenza-Unspecified 07/23/2012   Moderna Sars-Covid-2 Vaccination 08/01/2021   PFIZER(Purple Top)SARS-COV-2 Vaccination 01/01/2020, 01/13/2020, 02/05/2020, 08/31/2020   Pneumococcal Conjugate-13 08/20/2015, 08/01/2018   Pneumococcal Polysaccharide-23 11/03/2007, 12/04/2007, 01/12/2018   Zoster Recombinat (Shingrix) 04/11/2019    Family History  Problem Relation Age of Onset   Diabetes Mother    Stroke Mother      Current Outpatient Medications:    acetaminophen (TYLENOL) 500 MG tablet, Take 1,000 mg by mouth every 6 (six) hours as needed for moderate pain or headache., Disp: , Rfl:    albuterol (VENTOLIN HFA) 108 (90 Base) MCG/ACT inhaler, Inhale 2 puffs into the lungs every 6 (six) hours as needed., Disp: ,  Rfl:    alfuzosin (UROXATRAL) 10 MG 24 hr tablet, Take 1 tablet (10 mg total) by mouth daily with breakfast., Disp: 30 tablet, Rfl: 11   amLODipine (NORVASC) 10 MG tablet, Take 10 mg by mouth daily., Disp: , Rfl:    aspirin 81 MG tablet, Take 81 mg by mouth daily., Disp: , Rfl:    atorvastatin (LIPITOR) 20 MG tablet, Take 20 mg by mouth daily., Disp: , Rfl: 2   azaTHIOprine (IMURAN) 50 MG tablet, Take 100 mg by mouth daily. , Disp: , Rfl:    ciprofloxacin (CIPRO) 500 MG tablet, Take 1 tablet (500 mg total) by mouth every 12 (twelve) hours., Disp: 14 tablet, Rfl: 0   finasteride (PROSCAR) 5 MG tablet, Take 1 tablet (5 mg total) by mouth daily., Disp: 90 tablet, Rfl: 0   fluticasone (FLONASE) 50 MCG/ACT nasal spray, Place 2 sprays into both nostrils daily., Disp: 16  g, Rfl: 2   folic acid (FOLVITE) 527 MCG tablet, Take 400 mcg by mouth daily., Disp: , Rfl:    HYDROcodone bit-homatropine (HYCODAN) 5-1.5 MG/5ML syrup, Take 5 mLs by mouth 2 (two) times daily as needed for cough., Disp: 240 mL, Rfl: 0   insulin NPH Human (NOVOLIN N) 100 UNIT/ML injection, Inject 12 Units into the skin See admin instructions. Inject 10 units every morning and 20 units every evening, Disp: , Rfl:    Insulin Syringe-Needle U-100 (INSULIN SYRINGE .3CC/31GX5/16") 31G X 5/16" 0.3 ML MISC, Inject 1 Syringe into the skin as directed., Disp: , Rfl:    levothyroxine (SYNTHROID, LEVOTHROID) 25 MCG tablet, Take 25 mcg by mouth daily., Disp: , Rfl:    metFORMIN (GLUCOPHAGE-XR) 500 MG 24 hr tablet, Take 2 tablets (1,000 mg total) by mouth 2 (two) times daily., Disp: , Rfl:    pantoprazole (PROTONIX) 40 MG tablet, Take 40 mg by mouth daily., Disp: , Rfl:    predniSONE (DELTASONE) 1 MG tablet, Take 3 mg by mouth daily., Disp: , Rfl:    Treprostinil (TYVASO DPI MAINTENANCE KIT) 48 MCG POWD, Inhale 48 mcg into the lungs in the morning, at noon, in the evening, and at bedtime., Disp: , Rfl:    umeclidinium bromide (INCRUSE ELLIPTA) 62.5 MCG/ACT AEPB, Inhale 1 puff into the lungs daily., Disp: 30 each, Rfl: 5      Objective:   There were no vitals filed for this visit.  Estimated body mass index is 25.5 kg/m as calculated from the following:   Height as of 10/01/22: _0  (1.676 m).   Weight as of 10/01/22: 158 lb (71.7 kg).  _1 @  There were no vitals filed for this visit.   Physical Exam  General Appearance:    Alert, cooperative, no distress, appears stated age - *** , Deconditioned looking - *** , OBESE  - ***, Sitting on Wheelchair -  ***  Head:    Normocephalic, without obvious abnormality, atraumatic  Eyes:    PERRL, conjunctiva/corneas clear,  Ears:    Normal TM's and external ear canals, both ears  Nose:   Nares normal, septum midline, mucosa normal, no drainage     or sinus tenderness. OXYGEN ON  - *** . Patient is @ ***   Throat:   Lips, mucosa, and tongue normal; teeth and gums normal. Cyanosis on lips - ***  Neck:   Supple, symmetrical, trachea midline, no adenopathy;    thyroid:  no enlargement/tenderness/nodules; no carotid   bruit or JVD  Back:     Symmetric, no curvature,  ROM normal, no CVA tenderness  Lungs:     Distress - *** , Wheeze ***, Barrell Chest - ***, Purse lip breathing - ***, Crackles - ***   Chest Wall:    No tenderness or deformity.    Heart:    Regular rate and rhythm, S1 and S2 normal, no rub   or gallop, Murmur - ***  Breast Exam:    NOT DONE  Abdomen:     Soft, non-tender, bowel sounds active all four quadrants,    no masses, no organomegaly. Visceral obesity - ***  Genitalia:   NOT DONE  Rectal:   NOT DONE  Extremities:   Extremities - normal, Has Cane - ***, Clubbing - ***, Edema - ***  Pulses:   2+ and symmetric all extremities  Skin:   Stigmata of Connective Tissue Disease - ***  Lymph nodes:   Cervical, supraclavicular, and axillary nodes normal  Psychiatric:  Neurologic:   Pleasant - ***, Anxious - ***, Flat affect - ***  CAm-ICU - neg, Alert and Oriented x 3 - yes, Moves all 4s - yes, Speech - normal, Cognition - intact    General: No distress. *** Neuro: Alert and Oriented x 3. GCS 15. Speech normal Psych: Pleasant Resp:  Barrel Chest - ***.  Wheeze - ***, Crackles - ***, No overt respiratory distress CVS: Normal heart sounds. Murmurs - *** Ext: Stigmata of Connective Tissue Disease - *** HEENT: Normal upper airway. PEERL +. No post nasal drip        Assessment:     No diagnosis found.     Plan:     There are no Patient Instructions on file for this visit.    SIGNATURE    Dr. Brand Males, [name], [name],  Pulmonary and Critical Care Medicine Staff Physician, [place] Director - Interstitial Lung Disease  Program  Pulmonary [place] at  [place], [place], [address]  Pager: [phone], If no answer or between  15:00h - 7:00h: call [phone] Telephone: [phone]  8:20 AM 10/15/2022

## 2022-10-15 NOTE — Telephone Encounter (Signed)
Patient called in today concern about if he need to continued to take finasteride and if so he will need a refill. Made patient aware that I will reach out to the MD and someone will contact him once the MD response. Patient voiced understanding

## 2022-10-15 NOTE — Telephone Encounter (Signed)
Submitted a Prior Authorization RENEWAL request to [facility] for TYVASO DPI via CoverMyMeds. Will update once we receive a response.  Key: [id number]   Knox Saliva, PharmD, MPH, BCPS, CPP Clinical Pharmacist (Rheumatology and Pulmonology)

## 2022-10-16 NOTE — Telephone Encounter (Signed)
Received notification from [facility] regarding a prior authorization for TYVASO DPI. Authorization has been APPROVED from 10/15/2022 to 11/02/2023. Approval letter sent to scan center.  Patient must fill through [place]: [phone]  Authorization # [id number]  Knox Saliva, PharmD, MPH, BCPS, CPP Clinical Pharmacist (Rheumatology and Pulmonology)

## 2022-10-19 ENCOUNTER — Ambulatory Visit (INDEPENDENT_AMBULATORY_CARE_PROVIDER_SITE_OTHER): Payer: Medicare Other | Admitting: Internal Medicine

## 2022-10-19 DIAGNOSIS — J8489 Other specified interstitial pulmonary diseases: Secondary | ICD-10-CM

## 2022-10-19 DIAGNOSIS — M359 Systemic involvement of connective tissue, unspecified: Secondary | ICD-10-CM

## 2022-10-19 DIAGNOSIS — J9611 Chronic respiratory failure with hypoxia: Secondary | ICD-10-CM

## 2022-10-19 LAB — PULMONARY FUNCTION TEST
DL/VA % pred: 51 %
DL/VA: 2.06 ml/min/mmHg/L
DLCO cor % pred: 35 %
DLCO cor: 7.79 ml/min/mmHg
DLCO unc % pred: 35 %
DLCO unc: 7.79 ml/min/mmHg
FEF 25-75 Pre: 2.2 L/sec
FEF2575-%Pred-Pre: 124 %
FEV1-%Pred-Pre: 58 %
FEV1-Pre: 1.47 L
FEV1FVC-%Pred-Pre: 118 %
FEV6-%Pred-Pre: 52 %
FEV6-Pre: 1.71 L
FEV6FVC-%Pred-Pre: 107 %
FVC-%Pred-Pre: 48 %
FVC-Pre: 1.71 L
Pre FEV1/FVC ratio: 86 %
Pre FEV6/FVC Ratio: 100 %

## 2022-10-19 NOTE — Progress Notes (Signed)
Spirometry and diffusion capacity performed today. 

## 2022-10-19 NOTE — Patient Instructions (Signed)
Spirometry and diffusion capacity performed today.

## 2022-10-20 ENCOUNTER — Other Ambulatory Visit: Payer: Self-pay

## 2022-10-20 DIAGNOSIS — N138 Other obstructive and reflux uropathy: Secondary | ICD-10-CM

## 2022-10-20 MED ORDER — FINASTERIDE 5 MG PO TABS: 5.0000 mg | ORAL_TABLET | Freq: Every day | ORAL | 3 refills | Status: DC

## 2022-10-20 MED ORDER — FINASTERIDE 5 MG PO TABS: 5.0000 mg | ORAL_TABLET | Freq: Every day | ORAL | 0 refills | Status: DC

## 2022-10-20 NOTE — Telephone Encounter (Signed)
Tried calling patient with no answer, will try back later.

## 2022-10-20 NOTE — Addendum Note (Signed)
Addended by: Darcella Gasman R on: 10/20/2022 10:29 AM   Modules accepted: Orders

## 2022-10-21 DIAGNOSIS — M0589 Other rheumatoid arthritis with rheumatoid factor of multiple sites: Secondary | ICD-10-CM | POA: Diagnosis not present

## 2022-10-21 NOTE — Telephone Encounter (Signed)
Tried calling patient with no answer, will try back later. 

## 2022-10-23 ENCOUNTER — Ambulatory Visit: Payer: Medicare Other | Admitting: Internal Medicine

## 2022-10-23 ENCOUNTER — Encounter: Payer: Self-pay | Admitting: Internal Medicine

## 2022-10-23 ENCOUNTER — Telehealth: Payer: Self-pay | Admitting: Internal Medicine

## 2022-10-23 VITALS — BP 116/58 | HR 82 | Temp 97.8°F | Ht 66.0 in | Wt 145.0 lb

## 2022-10-23 DIAGNOSIS — Z79899 Other long term (current) drug therapy: Secondary | ICD-10-CM | POA: Diagnosis not present

## 2022-10-23 DIAGNOSIS — R634 Abnormal weight loss: Secondary | ICD-10-CM

## 2022-10-23 DIAGNOSIS — J9611 Chronic respiratory failure with hypoxia: Secondary | ICD-10-CM | POA: Diagnosis not present

## 2022-10-23 DIAGNOSIS — J8489 Other specified interstitial pulmonary diseases: Secondary | ICD-10-CM

## 2022-10-23 DIAGNOSIS — I2723 Pulmonary hypertension due to lung diseases and hypoxia: Secondary | ICD-10-CM

## 2022-10-23 DIAGNOSIS — M359 Systemic involvement of connective tissue, unspecified: Secondary | ICD-10-CM | POA: Diagnosis not present

## 2022-10-23 DIAGNOSIS — R911 Solitary pulmonary nodule: Secondary | ICD-10-CM

## 2022-10-23 DIAGNOSIS — D849 Immunodeficiency, unspecified: Secondary | ICD-10-CM

## 2022-10-23 NOTE — Telephone Encounter (Signed)
Heather,  On the CT scan in August 2023 there was a lingular nodule 9 mm that was new.  I did not address it with him at this visit.  Plan - Please order repeat high-resolution CT scan of the chest to be done in the next few weeks as a follow-up for that nodule     SIGNATURE    Dr. Brand Males, [name], [name],  Pulmonary and Critical Care Medicine Staff Physician, [place] Director - Interstitial Lung Disease  Program  Medical Director - [place] ICU Pulmonary [place] at [place], [place], [address]   Pager: [phone], If no answer  -[place] or Try [phone] Telephone (clinical office): [phone] Telephone (research): [phone]  12:57 PM 10/23/2022

## 2022-10-23 NOTE — Telephone Encounter (Signed)
Rx Team  [name] 1 jsut got rituxan with rheum 2 d ago. When are we starting actemra? He need switch. [name] here now if you want to talk to hm  Also, I see you have sent a note to OTum for his tvyaso reauth. [name] worried  Thanks    SIGNATURE    Dr. Brand Males, [name], [name],  Pulmonary and Critical Care Medicine Staff Physician, [place] Director - Interstitial Lung Disease  Program  Medical Director - [place] ICU Pulmonary [place] at [place], [place], [address]   Pager: [phone], If no answer  -[place] or Try [phone] Telephone (clinical office): [phone] Telephone (research): [phone]  11:43 AM 10/23/2022

## 2022-10-23 NOTE — Patient Instructions (Addendum)
Interstitial lung disease due to connective tissue disease (HCC) Chronic respiratory failure with hypoxia (HCC) Immunosuppressed and on high risk medication  - Progressive worsening of pulmonary fibrosis on symptoms, CT and PFT but since last visti PFT stable - worried outlook is bad   Plan - continue o2 at 4L [place] - do spiro/dlco in 3 months -At this point in time no antifibrotic's because of side effects --Continue supportive care with oxygen therapy and physical conditioning -Immunomodulator therapy   - Rituxan last dose approx 10/20/22 -> our office Phmaracy team is changing you to Acterma subcut injection   - once you are on Actemra we will inform Dr Kathlene November  - prednisone to continue as before -Continue participation in ILD-pro registry - if you decline to point you cannot do simple things at home we will have to discuss hospice but for now let us see if changing Rituxan to Actemra can help you  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 RChase Callerfor standard of care visit in 30-minute slot in 3-4 months but after PFT  = ild symptom score and walk test at followup

## 2022-10-23 NOTE — Progress Notes (Signed)
OV 01/01/2017  Chief Complaint  Patient presents with   Follow-up    Pt here after PFT. Pt states overall his breathing is unchanged since last OV. Pt c/o occ dry cough. Pt deneis CP/tightness and f/c/s.    Follow-up interstitial lung disease UIP pattern associated with rheumatoid arthritis and immune suppression. Last visit in March 2017. He doesn't that overall he has been stable. He has a new rheumatologist but still at [facility] Dr [name]. His wife also says he is stable. However as he started talking to them his wife slowly started telling me that for the last few to several months he is wheezing more than usual and is a little more short of breath than usual. However he denied it. This is compliant with his medications. [name] not on any inhaler therapy. He uses oxygen only at night. Previous CT scans of the chest in September 2016 in March 2017 did not report any associated emphysema. His most recent pulmonary function test is shown that on Combivent. I personally visualized the graph: [name] Kitchen Forced vital capacity is stable with a diffusion capacity seems to decline over time. Walking desaturation test 185 feet 3 laps on room air: He desaturated to 84%. And then we walked him on 2 L oxygen he did not desaturate.   OV 06/23/2017  Chief Complaint  Patient presents with   Follow-up    pt here after CT in 04/2017. Pt states his breathing is unchanged since last OV. Pt c/o interemittent dry cough. Pt denies CP/tightness and f/c/s.      Follow-up interstitial lung disease UIP pattern associated with rheumatoid arthritis and on Imuran. Last visit in March 2018 I was concerned about progressive interstitial lung disease because he started desaturating on room air and his FVC show decline. Currently rated a high-resolution CT chest that confirmed progressive lung disease. We walked him 185 feet 3 laps on room air and he desaturated at third lap to 89%. This is very similar  to previous visit suggesting that  that his decline has been more gradual over the last 1 year. Compared to last visit he feels stable. His wife feels the same. [name] aware of his worsening fibrosis and the potential need to change anti fibrotics  OV 09/27/2017  Chief Complaint  Patient presents with   Follow-up    PFT done today. Pt states that he thinks his breathing has become worse. Has complaints of SOB with exertion, occ. dry cough. Denies any CP. States that he does wear O2 at night, DME: [facility].     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  Patient is on Imuran.  [name] having progressive disease this year 2018.  He told my certified medical assistant that he is having progressive shortness of breath but to me he tells me that he is stable.  As always he is a very poor historian.  Wife gives a better history.  He has oxygen at home for the night which she uses but does not use his exertional small portable system even though this helps him.  They do admit that dyspnea is progressive.  In fact pulmonary function test today shows progressive disease.  He also seem to desaturate a little bit more easily.  He continues to be on Imuran.  In talking to him it appears his rheumatologist has now started him on Rituxan.  He has had 2 doses 2 weeks apart with the most recent  one being mid November 2018.  The next dose is at mid May 2019 which is 37-monthinterval.  He says he is up-to-date with his flu shot but is not had a conversation with his primary care physician about the new shingles vaccine that is inactivated vaccine.  [name] not having much of joint issues.  [name] aware that the Rituxan is specifically designed to slow down the progression of pulmonary fibrosis.  Walking desaturation test 185 feet x3 laps on room air with a forehead probe.  Resting heart rate was 95/min.  Final heart rate 108/min.  Resting pulse ox was 95%.  By the time he finished his  second lap he desaturated to 84%.  He was then placed on 2 L nasal cannula and he was able to maintain his saturation to 95%.   OV 12/20/2017  Chief Complaint  Patient presents with   Follow-up    follow up PFT,SOB with activity/exertion   [name] is here for follow-up because of his progressive UIP pattern pulmonary fibrosis related to rheumatoid arthritis.  At his last visit he was started on Rituxan by rheumatology.  Since then we started him on portable oxygen but he says he is not using it because he does not like to use it.  He does use oxygen at night which is helping.  His next dose of Rituxan is in May 2019.  Both he and his wife attest that since the start of Rituxan his symptoms are stable particularly shortness of breath is not any worse.  In fact pulmonary function test today shows continued ongoing stability with FVC and DLCO would suggest that the Rituxan might be helping him.  There are no new issues.  [name] here to have a shingles vaccine.  I told him about this last visit to talk to primary care physician but he does not remember this.   IMPRESSION: June 2018 1. Continued interval progression of fibrotic interstitial lung disease with extensive honeycombing, asymmetrically involving the right lung, with mild basilar predominance, diagnostic of usual interstitial pneumonia (UIP) . 2. Stable mild reactive mediastinal lymphadenopathy. 3. Aortic atherosclerosis.  One vessel coronary atherosclerosis.     Electronically Signed   By: [name]   On: 04/09/2017 10:51   OV 08/01/2018  Subjective:  Patient ID: LManases Etchison male , DOB: 612/05/1946, age 76y.o. , MRN: [id number], [place] [address]  08/01/2018 -   Chief Complaint  Patient presents with   Follow-up    PFT today, increased coughing (non-productive)    Follow-up interstitial lung disease UIP pattern associated with rheumatoid arthritis. Rx Rituxan since approx Oct/Nov 2018.  Background Immuran  HPI LFabienScales 76y.o. -presents as usual with his wife.  He tells me that he continues on background Imuran.  [name] also on scheduled Rituxan.  He has an infusion coming up in a month or so.  This is according to his wife.  He continues to be a poor historian.  As best as I can gather from both of them is that his shortness of breath is actually a little bit better ever since starting Rituxan..  In fact his PFTs show continued improvement/stability.  With his FVC and DLCO.  However he tells me that his cough might be a little bit worse.  In talking to him it appears that is only mildly worse.  It happens mostly at night when he lies down.  He coughs  a few times and then it does not bother him.  The cough is dry.  He denies any postnasal drip or acid reflux disease.  He does not have any fever or sputum production.       OV 10/06/2018  Subjective:  Patient ID: Jamisen Duerson, male , DOB: 1946-11-02 , age 70 y.o. , MRN: [id number] , ADDRESS: [place] [address]   10/06/2018 -   Chief Complaint  Patient presents with   Follow-up    f/u pulmonary fibrosis, no symptoms, complaints, breathing doind well     HPI Rhiley Zaugg 76 y.o. -returns for ILD follow-up in the setting of rheumatoid arthritis.  This time he is not here with his wife who has other schedule conflicts.  He tells me overall he stable.  He has now started Rituxan.  As again he is a poor historian.  I have him starting Rituxan a year ago but he told me that it was only started recently and has had only 1 infusion.  He tells me that he is stable.  His cough is well controlled with this anticholinergic he is using nighttime oxygen.  He says he is compliant with his medications.  Last visit I told him to inquire about using Bactrim prophylaxis in the setting of B-cell depletion in the setting of Rituxan.  He has no idea recollection of this conversation.  So I did tell him that we will check his G6PD and  discuss directly with his rheumatologist about starting Bactrim prophylaxis.  [name] okay with this plan.  Overall dyspnea and cough are stable and it is mild.  Walking desaturation test shows stability.     OV 06/29/2019  Subjective:  Patient ID: Mathew Postiglione, male , DOB: 06-Jul-1946 , age 82 y.o. , MRN: [id number] , ADDRESS: [address] [place] [address]   Follow-up interstitial lung disease UIP pattern associated with rheumatoid arthritis. Rx Rituxan since approx Oct/Nov 2018. On Background Immuran, prednisone and dapsone for pcp prophylaxois = high risk medication use   06/29/2019 - ILD- RA  followup    HPI Keymarion Mirabella 76 y.o. -presents for follow-up.  I personally saw him December 2019 before the onset of the pandemic.  He tells me that he is doing fair.  He feels he is overall stable.  He continues to be on Rituxan.  He tells me the last dose of this infusion was in May 05, 2019.  This is managed by rheumatology.  In addition he is on Imuran, prednisone.  Also by middle of year my nurse practitioner started him on dapsone for PCP prophylaxis.  He did have a high-resolution CT chest that shows UIP findings and stability since 2018.  However, he did desaturate with exertion when he saw the nurse practitioner just over a month ago.  This is a new finding as documented below.  We walked him again today and again found his desaturation with exertion.  It required 5 L to correct.  In past he has done well with RA at times but at times required 2L. He denies any fever or sputum production.  His previous echocardiogram was in 2013.  Most recent CT scan of the chest notes coronary artery calcification   Noted WBC going down after dapsine start Results for JAYDRIEN, WASSENAAR (MRN [id number]) as of 06/29/2019 10:19  Ref. Range 05/30/2019 10:17 06/07/2019 09:07 06/13/2019 08:59 06/20/2019 09:50  WBC Latest Ref Range: 4.0 - 10.5 K/uL 8.8 6.2 4.3 3.4 (L)  IMPRESSION: HRCT July 2020 1. No significant  interval change in severe pulmonary fibrosis, which is heterogeneously distributed and most severe in the right lung base, featuring extensive honeycombing and mild traction bronchiectasis. Although findings are not significantly changed compared to most recent examination dated 2018, they are clearly progressed over time in comparison to priors dating back to 2013. Findings remain in a UIP pattern by ATS pulmonary fibrosis criteria   2.  Coronary artery disease.     Electronically Signed   By: Eddie Candle [name]   On: 05/12/2019 13:14  ROS - per HPI   OV 01/04/2020  Subjective:  Patient ID: [name], male , DOB: [birth year] , age 6 y.o. , MRN: [id number] , [address] [place] [address]   01/04/2020 -   Chief Complaint  Patient presents with   Follow-up    Pt states he has been doing good since last visit. States he still has an occ cough.    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 Rx Rituxan since approx Oct/Nov 2018. On Background Immuran, prednisone and  = high risk medication use     HPI Kimber Yellen 76 y.o. -returns for follow-up.  Last seen in the summer 2020.  Overall he is doing stable.  He continues with his immunosuppressive medications through rheumatology.  This include Imuran prednisone and Rituxan.  He had his infusion of Rituxan this week.  He feels stable but his pulmonary function test done for this visit shows a decline.  It is documented below his walking desaturation test also is a decline from the past but similar to last visit 6 months or so ago.  His main symptom is chronic cough.  [name] labeled it as a level 3 out of 5 but it appears it might actually be more severe.  [name] asking for palliative relief of cough.  His last liver function test with [place] was in the summer 2020.  It appears per review of the chart in the interim he has seen cardiology Dr. Percival Spanish for his coronary artery calcification and  is on expectant follow-up for that.  No stress test has been recommended because of the lack of chest pain.  His wife who is usually here with him is not here today.  He said she slept in.  [name] on MDI - no emphysema noted in prior CT    ROS - per HPI  OV 02/15/2020  Subjective:  Patient ID: [name], male , DOB: 02-28-1946 , age 60 y.o. , MRN: [id number] , ADDRESS: [place] [address]   02/15/2020 -   Chief Complaint  Patient presents with   Follow-up    Follow-up interstitial lung disease UIP pattern associated with rheumatoid arthritis.   - Rx Rituxan since approx Oct/Nov 2018. On Background Immuran, prednisone and  = high risk medication use  - started ofev mid -  end of march 2021 for progressive disease    HPI Brylen Ogburn 76 y.o. -he has progressive ILD because of rheumatoid arthritis UIP pattern.  Last visit started him on nintedanib.  [name] now here to follow-up for tolerance and monitoring of nintedanib because this requires intensive therapeutic monitoring.  [name] also immunosuppressed.  He tells me that he is now started nintedanib approximately 1 month ago.  [name] tolerating it fine without any nausea vomiting diarrhea.  Last visit he took the consent [name] for the ILD-pro registry study.  [name] agreed now to participate in it.  He meets inclusion exclusion criteria because of progressive disease.  At this point in time he is got minimal symptoms.  He desaturated as before.  However he is not compliant with his portable oxygen with exertion.  His cough is improved because of Hycodan.   OV 05/01/2020  Subjective:  Patient ID: Koltyn Kelsay, male , DOB: 06/28/1946 , age 30 y.o. , MRN: [id number] , ADDRESS: [place] [address]   05/01/2020 -   Chief Complaint  Patient presents with   Follow-up    pt is here to to disuss ild    OV 05/30/2020   Subjective:  Patient ID: [name], male , DOB: [birth year], age 48 y.o.  years. , MRN: [id number],  ADDRESS: [address] [place] [address] PCP  Merrilee Seashore, MD Providers : Treatment Team:  Attending Provider: Brand Males, MD   Chief Complaint  Patient presents with   Follow-up    Pt states he has been doing good since last visit and states his breathing is about the same.     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   - Rx Rituxan since approx Oct/Nov 2018. On Background Immuran, prednisone and  = high risk medication use  - started ofev mid -  end of march 2021 for progressive disease -> changed to [facility]  June 2021    HPI Cruzito Balaban 76 y.o. -presents for follow-up.  This time he has brought his wife.  Have not seen the wife in a long time.  She appears to have lost weight.  She says she is dealing with her own health issues of unexplained weight loss.  She says she is abreast of his health issues when he returns home after visits.  However patient continues to be stoic and always states he is feeling well.  Last time he said he was feeling well but had high level of symptoms.  This time also he says he is feeling well and gaining weight and has marked very low level of symptoms.  Therefore overall his history is unreliable.  In the interim he has had his right heart catheterization.  He had nonobstructive coronary artery disease his.  His mean pulmonary artery pressure was elevated at 25.  Wedge pressure could not be elucidated.  His peripheral vascular resistance appears greater than 3.  Therefore he qualifies for nebulized treprostinil.  However he still in the uptake with his pirfenidone.  [name] not applying sunscreen and he says he will.  [name] tolerating pirfenidone well.  He has gained weight no nausea no vomiting no diarrhea.  He uses 4 L of pulsed oxygen at rest.  However at room air at rest he was normal.  He desaturated only after walking 2 laps.    OV 08/28/2020   Subjective:   Patient ID: Keylin Podolsky, male , DOB: Mar 05, 1946, age 18 y.o. years. , MRN: [id number],  ADDRESS: [address] [place] [address] PCP  Merrilee Seashore, MD Providers : Treatment Team:  Attending Provider: Brand Males, MD Patient Care Team: Merrilee Seashore, MD as PCP - General (Internal Medicine) Hennie Duos, MD as Consulting Physician (Rheumatology) Lauraine Rinne, NP as Nurse Practitioner (Pulmonary Disease)    Chief Complaint  Patient presents with   Follow-up    coughs when starts eating.      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   -  Rx Rituxan since approx Oct/Nov 2018. On Background Immuran, prednisone and  = high risk medication use  - started ofev mid -  end of march 2021 for progressive disease -> changed to [facility]  June 2021 -> stopped sept 2021 due to rash     HPI Vergil Stettler 76 y.o. -returns for follow-up.  At this visit he was supposed to do spirometry and DLCO but he has not.  He presents by himself.  It is unclear why he did not do spirometry and DLCO.  As usual he is stoic and does not give much of a history.  His symptom score appears stable.  His weight appears stable.  He uses 4 L of oxygen.  Between the last 2 visits he made phone calls suggesting initially that the pirfenidone was causing a rash in his upper arm but later the phone call said Spiriva.  We indicated that he should stop the Spiriva.  Today he tells me that he is actually continuing his Spiriva.  He tells me that the rash was caused by the pirfenidone/Esbriet.  Initially did not seem sure it was because of the [place] but latter when I repeated questions in multiple different ways he indicated it was desperate causing the rash therefore he stopped it.  He has been 1 month since he stopped it and the rash is disappeared.  The rash was in a nonexposed area in the right upper extremity.  He has had his Covid vaccine but has not had his  booster.  He continues to be on Rituxan which is tolerating well.  I did indicate to him that in the absence of B cells he might not have antibodies and he should get his Covid booster.  We will check Covid IgG as follow-up post booster needs continued intensive monitoring due to high complex status and immunosuppression    OV 12/03/2020  Subjective:  Patient ID: [name], male , DOB: [birth year] , age 62 y.o. , MRN: [id number] , ADDRESS: [address] [place] [address] PCP Merrilee Seashore, MD Patient Care Team: Merrilee Seashore, MD as PCP - General (Internal Medicine) Minus Breeding, MD as PCP - Cardiology (Cardiology) Hennie Duos, MD as Consulting Physician (Rheumatology) Lauraine Rinne, NP as Nurse Practitioner (Pulmonary Disease)  This Provider for this visit: Treatment Team:  Attending Provider: Brand Males, MD    12/03/2020 -   Chief Complaint  Patient presents with   Follow-up    F/U after hospital stay. States he has been feeling well since being home.      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   - Rx Rituxan since approx Oct/Nov 2018. On Background Immuran, prednisone and  = high risk medication use  - started ofev mid -  end of march 2021 for progressive disease -> changed to [facility]  June 2021 -> stopped sept 2021 due to rash  -Participant in ILD-pro registry study   Pulm HTn [place] 11/18/20 v- Dr Glori Bickers  Repeat [place] yesterday c/w mild to moderate pulmonary HTN with high cardiac output. PA = 59/18 (34). PCW = 10. PVR = 3.1  - Given PAH in setting of IPF, a trial of inhaled treprostinil seems reasonable    12/03/2020 [name] Swindler returns for follow-up.  After my last visit in October 2021 he decompensated in January 2022 and admitted to the hospital intensive care unit needing BiPAP for respiratory failure in January 2022 last month.  I was in  touch with the intensive care physician Dr. Doyne Keel  and heart failure specialist Dr. Haroldine Laws during this time.  They did a right heart catheterization in the results are above.  Patient does have WHO group 3 pulmonary hypertension.  Inhaled treprostinil is indicated.  Dr. Clayborne Dana pharmacist [facility] working on this.  I just touch base with Dr. Haroldine Laws about this.  Patient is here with his wife.  He says he is much improved.  [name] back to his 3 L nasal cannula at baseline.  He reports having had his Covid booster in October 2021.  He had his booster after we found that his Covid IgG was negative.  Of note he is on Rituxan which suppresses IgG production.  He and his wife are interested in future therapeutic options.  [name] a participant in the ILD-pro registry.        OV 02/27/2021  Subjective:  Patient ID: [name], male , DOB: 1946/10/16 , age 44 y.o. , MRN: [id number] , ADDRESS: [address] [place] [address] PCP Merrilee Seashore, MD Patient Care Team: Merrilee Seashore, MD as PCP - General (Internal Medicine) Minus Breeding, MD as PCP - Cardiology (Cardiology) Hennie Duos, MD as Consulting Physician (Rheumatology) Lauraine Rinne, NP as Nurse Practitioner (Pulmonary Disease)  This Provider for this visit: Treatment Team:  Attending Provider: Brand Males, MD    02/27/2021 -   Chief Complaint  Patient presents with   Follow-up    Doing ok, breathing is stable     HPI Arlind Haller 76 y.o. -returns for follow-up.  He presents with his wife.  He endorses stability.  His symptom score shows worsening but I suspect this is unreliable because he never gives a good history.  He had pulmonary function test that shows improvement compared to the most recent 1 but still worse compared to February 2021.  In the interim he has been started on inhaled treprostinil as of March 2022.  There is some subset analysis evidence to suggest treprostinil can modulte PFTs [and is the question being raised in the upcoming tETON  trial and for IPF] he is on his immunomodulator treatment is finished COVID prophylaxis monoclonal antibody.  [name] able to participate today in the ILD Pro registry visit.      OV 06/30/2021  Subjective:  Patient ID: [name], male , DOB: 05/11/1946 , age 28 y.o. , MRN: [id number] , ADDRESS: [address] [place] [address] PCP Merrilee Seashore, MD Patient Care Team: Merrilee Seashore, MD as PCP - General (Internal Medicine) Minus Breeding, MD as PCP - Cardiology (Cardiology) Hennie Duos, MD as Consulting Physician (Rheumatology) Lauraine Rinne, NP as Nurse Practitioner (Pulmonary Disease)  This Provider for this visit: Treatment Team:  Attending Provider: Melvenia Needles, NP    06/30/2021 -   Chief Complaint  Patient presents with   Follow-up    Pt states coughing.    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   - Rx Rituxan since approx Oct/Nov 2018. On Background Immuran, prednisone and  = high risk medication use  - started ofev mid -  end of march 2021 for progressive disease -> changed to [facility]  June 2021 -> stopped sept 2021 due to rash -> not on anti-foibritc  -Participant in ILD-pro registry study   Pulm HTn [place] 11/18/20 - Dr Glori Bickers  Repeat [place] yesterday c/w mild to moderate pulmonary HTN with high cardiac output. PA = 59/18 (  34). PCW = 10. PVR = 3.1  -  started tyvaso early march 2022 - through [facility] Riles - CHF Pharmacist  Associated mild emphysema [previous intolerance to  Spiriva)  - feb 2022 On inceuse  COvid prophylassix (IgG negative Feb 2022)  - tixagevimab/cilgavimab, for pre-exposure prophylaxis  Feb 2022 -= repeat dosing March 2022  HPI Breyson Rottenberg 76 y.o. -returns for follow-up.  He presents with his wife.  He tells me that overall he is doing stable.  [name] not on antifibrotic's symptom score shows stability.  Pulmonary function test done?  Declining versus stable.  See below.   [name] using 4 L of oxygen.  Wife states his cough is worse.  He then acknowledged that his cough is worse.  Wife rated as a 5 out of 5.  He says it does not affect his quality of life.  As always there is a discrepancy between his history and her history.  Nevertheless she feels it significant.  The sputum is clear.  There is no wheezing associated with this.  He has tried [employer] and other over-the-counter treatments but these have not helped.  We discussed doing Hycodan opioid cough relief for palliative management of cough in the setting of pulmonary fibrosis.  [name] open to this idea.  [name] taking inhaled Tyvaso for his pulmonary hypertension 4 times a day each time 9 puffs.  He feels it is helping.  At this point he is maxed out on therapy.  [name] doing prednisone, Imuran and Rituxan.  This for his rheumatoid arthritis.  If he declines he can always add CellCept.  [name] a participant in the research registry study.  His 59-monthvisit is anytime between now and November.  [name] willing to do his research visit today.   OV 11/24/2021  Subjective:  Patient ID: LAngelena [name] , DOB: [birth year], age 76y.o. , MRN: [id number], ADDRESS: [address] [place] [id number] RMerrilee Seashore MD Patient Care Team: RMerrilee Seashore MD as PCP - General (Internal Medicine) HMinus Breeding MD as PCP - Cardiology (Cardiology) BHennie Duos MD as Consulting Physician (Rheumatology) MLauraine Rinne NP as Nurse Practitioner (Pulmonary Disease)  This Provider for this visit: Treatment Team:  Attending Provider: RBrand Males MD    11/24/2021 -   Chief Complaint  Patient presents with   Follow-up    F/U after SArlyce Harman States he has been stable since last visit. No new symptoms. Still using 4L of O2.     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   - Rx Rituxan since approx Oct/Nov 2018. On Background Immuran, prednisone and  = high  risk medication use  - started ofev mid -  end of march 2021 for progressive disease -> [facility] -> stopped sept 2021 due to rash -> not on anti-foibritc  -Participant in ILD-pro registry study   Pulm HTn [place] - Dr [place]i Bickers [place] c/w mild to moderate pulmonary HTN with high cardiac output. PA = 59/18 (34). PCW = 10. PVR = 3.1  -  started tyvaso early march 2022 - through LAudry Riles- CHF Pharmacist  Associated mild emphysema [previous intolerance to  Spiriva)  - feb 2022 On inceuse  COvid prophylassix (IgG negative Feb 2022)  - tixagevimab/cilgavimab, for pre-exposure prophylaxis  Feb 2022 - repeat dosing March 2022  Chronic cough on -Hycodan opioid  HPI Hallie Finger 76 y.o. -presents for routine follow-up.  At this point in time he is Rituxan, Imuran and prednisone through the rheumatologist.  [name] on high risk prescription.  [name] not on any antifibrotic's because of side effects.  [name] getting inhaled treprostinil nebulizer through cardiology for his WHO group 3 pulmonary hypertension.  For his associated emphysema is run out of Incruse.  His cough is well controlled but for the last few weeks he is having increased yellow sputum but there is no change in his baseline wheeze which he continues to have intermittently.  Overall he states his symptoms are stable.  However on the symptom score below symptoms have progressed but again I doubt his accuracy and answering these questions.  [name] participant in ILD-Pro registry.  According to the research assistant next visit is only in April 2023.  He had pulmonary function test today and shows continued stability and personal interpretation.  Recently but overlal progression.         OV 02/24/2022  Subjective:  Patient ID: [name], male , DOB: [birth year] , age 5 y.o. , MRN: [id number] , ADDRESS: [address] [place] [address] PCP Merrilee Seashore, MD Patient Care  Team: Merrilee Seashore, MD as PCP - General (Internal Medicine) Minus Breeding, MD as PCP - Cardiology (Cardiology) Hennie Duos, MD as Consulting Physician (Rheumatology) Lauraine Rinne, NP as Nurse Practitioner (Pulmonary Disease)  This Provider for this visit: Treatment Team:  Attending Provider: Brand Males, MD    02/24/2022 -   Chief Complaint  Patient presents with   Follow-up    Pt states he is about the same since last visit. Still becomes SOB with exertion.  id  HPI Broly Gavidia 76 y.o. -returns for his 73-monthfollow-up.  He says overall he is doing "fair".  He states he is stable.  [name] using 4 L nasal cannula.  His symptom score shows stability.  He continues to use treprostinil but this time is using a DPI.  He says he is tolerating it well except he has cough but the cough is severe although it is quite tolerable and is able to manage.  He would rather deal with the cough then to give up inhaled treprostinil.  He continues immunomodulators of Rituxan which he is due for next month, Imuran and also prednisone.  [name] continue to lose weight but he says current weight is satisfactory.  [name] a positive ILD-Pro registry visit but I do not know the schedule for that.  Overall he is stable.    05/13/2022 Acute OV : Weight loss, RA-ILD  Patient presents for a work in visit.  Patient complains that over the last 3 months his weight continues to go down he is down another 10 pounds.  He has increased fatigue decreased activity tolerance and shortness of breath. Complains of increased cough, congestion with thick yellow mucus . No body aches or fever. No increased joint pain . No joint swelling or redness . Low appetite, no desire to eat. No abdominal pain, n/v/d. No dysuria . No hemoptysis.  Patient is followed for rheumatoid arthritis associated interstitial lung disease with UIP pattern.  He has chronic respiratory failure on home oxygen.  [name] followed by rheumatology  is on Imuran and low-dose prednisone.  Prednisone dose is at 3 mg daily.  Patient is had no increased oxygen demands.  Remains on 4 L of oxygen.  Patient has been intolerant to antifibrotic's  in the past.  He has ILD associated pulmonary hypertension.  [name] on Tyvaso. Says doing well  on it without headache or dizziness.  No recent labs or xray. Last CT chest November 15, 2020 shows trace left pleural effusion extensive patchy groundglass opacity and consolidation throughout both lungs superimposed on chronic patchy reticulation and moderate traction bronchiectasis with extensive honeycombing and architectural distortion findings suggestive of a multi lobar pneumonia superimposed on previously documented UIP.   OV 06/18/2022  Subjective:  Patient ID: [name], male , DOB: Jan 28, 1946 , age 32 y.o. , MRN: [id number] , ADDRESS: [address] [place] [id number] PCP Merrilee Seashore, MD Patient Care Team: Merrilee Seashore, MD as PCP - General (Internal Medicine) Minus Breeding, MD as PCP - Cardiology (Cardiology) Hennie Duos, MD as Consulting Physician (Rheumatology) Lauraine Rinne, NP as Nurse Practitioner (Pulmonary Disease)  This Provider for this visit: Treatment Team:  Attending Provider: Brand Males, MD    06/18/2022 -   Chief Complaint  Patient presents with   Follow-up    PFT performed today.  Pt states his breathing has become worse since last visit. States that he is becoming short-winded quicker. States that he has been coughing a lot.     HPI Wyett Vivian 76 y.o. -presents with wife. Initially said he is "fair  ok": but he has lost more weight. Needing 4L [place] . On Rituxan (last June 2023), prednisone and tyvaso. He did admit to weight loss. Also saying more dyspneic. Cannot do groceries from his car to house anymore but can do other ADLs in house. He feels anti-fibrotics made him worse. Had CT and PFT - personally visualized and showe him CT - both are getting  worse. D/w Dr Kathlene November his rheum  he is supportive of my idea to change rituxan to actemra but wants [place] to do it     OV 10/23/2022  Subjective:  Patient ID: [name], male , DOB: December 01, 1945 , age 76 y.o. , MRN: [id number] , ADDRESS: [place] [id number] PCP Merrilee Seashore, MD Patient Care Team: Merrilee Seashore, MD as PCP - General (Internal Medicine) Minus Breeding, MD as PCP - Cardiology (Cardiology) Hennie Duos, MD as Consulting Physician (Rheumatology) Lauraine Rinne, NP as Nurse Practitioner (Pulmonary Disease)  This Provider for this visit: Treatment Team:  Attending Provider: Brand Males, MD    10/23/2022 -   Chief Complaint  Patient presents with   Follow-up    Review PFT from 10/19/2022.  Wheeze and SOB persistent.    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   - Rx Rituxan since approx Oct/Nov 2018. ->  Last dose every 6 months approximately October 21, 2022.   -> Moving to tocilizumab subcutaneous injections December 2023/January 2024. - On immuran,  -On prednisone and  = high risk medication use  - started ofev mid -  end of march 2021 for progressive disease -> changed to Esbreit Mid  June 2021 -> stopped sept 2021 due to rash -> not on anti-foibritc  -Participant in ILD-pro registry study   Pulm HTn [place] 11/18/20 - Dr Quillian Quince Bensimhon  Repeat [place]  c/w mild to moderate pulmonary HTN with high cardiac output. PA = 59/18 (34). PCW = 10. PVR = 3.1  -  started tyvaso early march 2022 - through Audry Riles - CHF Pharmacist  Associated mild emphysema [previous intolerance to  Spiriva)  - feb 2022 On inceuse  COvid prophylassix (IgG negative  Feb 2022)  - tixagevimab/cilgavimab, for pre-exposure prophylaxis  Feb 2022 - repeat dosing March 2022  Chronic cough on -Hycodan opiod  9 mm with a lingular nodule on CT August 2023  Abnormal weight loss - active as of dec 2023  Severe  chronic hypoxemic respiratory failure due to all of the above: 4 L oxygen as of December 2023.   HPI Daishon Hurless 76 y.o. -returns for follow-up.  He says his breathing is "fair".  [name] on 4 L oxygen returns for follow-up.  He says his breathing is "fair".  [name] on 4 L oxygen and this has not changed.  He has lost a lot of weight.  [name] now at 145 pounds and looks quite clean.  His last CT scan in August 2023 of the chest there was a 9 mm lingular nodule that was new.  His last CT abdomen was in April 2021.  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  We are going to start Actemra as a rollover from Rituxan.  We are waiting 6 months to start it in December 2023.  However he tells me that 2 days ago his rheumatologist given Rituxan.  His rheumatologist actually wanted [place] to start the Actemra because of cost issues.  However when he visited his rheumatologist they gave him the Rituxan.  I did speak with Dr. Kathlene November and did tell him that his Actemra is approved and we will plan to start it.  He has expressed that he will cancel Rituxan infusions at their office.  Of note he is worried that his treprostinil which he is compliant with and this feels that his it is helping him is going be denied by insurance.  I discussed with our pharmacist and she acknowledged that it has not been reauthorized and approved.    He has severe chronic respiratory failure  SYMPTOM SCALE - ILD 06/29/2019  01/04/2020  02/15/2020  05/01/2020  05/30/2020  08/28/2020  02/27/2021 On tyvasos 06/30/2021  11/24/2021 tyvaso 02/24/2022  06/18/2022 Tyvaso, rituxan, prednisone 10/23/2022 145#  O2 use Uses 3-5L with exertion yuses 3-5L with exertion Non compliant 168# and 4L with exertion - on esbriet 172# on esbriet - 4L at rest  - filled bu him and wife 174# 4L o2 172# 4L O2  169#. 4L 165# 157# 145#  Shortness of Breath 0 -> 5 scale with 5  being worst (score 6 If unable to do)             At rest _0 4   Simple tasks - showers, clothes change, eating, shaving _1 3   Household (dishes, doing bed, laundry) 2 1 0 5 0  0 _2 Shopping _3 0 0 0 _4 Walking level at own pace _5 0 0 _6 Walking up Stairs _7 Total (40 - 48) Dyspnea Score _8 How bad is your cough? _9 How bad is your fatigue 2 1 0 4 0 0 _10 4  nasuea    4 0 0 0 _0 vomit    0 0 0 0 00 0 0 0   diarrhea  _1   anxiety  _2   deipression  _3   0     Simple office walk 08/01/2018 -  185 feet x  3 laps goal with forehead probe at [place]  10/06/2018 250 feet x 3 laps 06/29/2019  01/04/2020  02/15/2020  05/30/2020  12/03/2020 Using 3L [place] at home all time 02/27/2021 Uses 4L at home 02/24/2022 4L pulses  O2 used _4  raoom air after turing o2 off fx 5 mn Room air Room air 4L   Number laps completed 3 3 Aim for 3 1 Did only 1 of 3 Did all 3 Did only 2 laps Did only 0.5 lap desats at 0.5 of 3 l;aps Didd n1 lap  Comments about pace normal normal  avg pace avg opae avg pagce avg pace  avg  Resting Pulse Ox/HR 100% and 80/min 100% and 81/min 94% and 96/min 96% and 101/min 99% and 98/min 96% and 94/min 96% and HR 96 89% RA -> 95 % 100% and HR 83  Final Pulse Ox/HR 94% and 114/min 91% and 111/min 86% at first lap, HR 131 86% and 118/min 88% and 123/min 83% and 125/,n 85% and HR 124 at half lap 87% at half lap 90% and HR 116  Desaturated </= 88% no no  yes yes    no  Desaturated <= 3% points yes yes  yes yes    yes  Got Tachycardic >/= 90/min yes yes  yes yes    yes  Symptoms at end of test x x  Mild dyspnea dyspnea    Severe dyspnea stopeda  1 lap  Miscellaneous comments x x Needed 5L [place] to correct    Corrected with 4L [place] and did noly 2 laps        PFT     Latest Ref Rng & Units 10/19/2022    1:57 PM 06/18/2022   10:10 AM 11/24/2021    8:54 AM 06/30/2021    1:20 PM 02/27/2021    9:55 AM 04/08/2020    9:49 AM 12/26/2019    8:54 AM  PFT Results  FVC-Pre L 1.71  P 1.53  1.78  1.76  1.85  1.73  1.96   FVC-Predicted Pre % 48  P 43  56  56  58  54  61   Pre FEV1/FVC % % 86  P 90  85  87  89  90  86   FEV1-Pre L 1.47  P 1.39  1.52  1.53  1.65  1.55  1.69   FEV1-Predicted Pre % 58  P 55  66  66  71  66  72   DLCO uncorrected ml/min/mmHg 7.79  P 5.01  6.59  7.62  10.01  8.10  13.14   DLCO UNC% % 35  P 22  29  34  45  36  58   DLCO corrected ml/min/mmHg 7.79  P 5.99  6.59  7.62  10.01  9.15    DLCO COR %Predicted % 35  P 27  29  34  45  41    DLVA Predicted % 51  P 46  46  55  67  68  88     P Preliminary result       has a past medical history of Arthritis, COPD (chronic obstructive pulmonary disease) ([place]), Diabetes mellitus, Hypertension, Pneumonia, and Thyroid disease.   reports that he quit smoking about 37 years ago. His smoking use included cigarettes. He has a 5.00 pack-year smoking history. He has never used smokeless tobacco.  Past Surgical History:  Procedure Laterality Date   KNEE ARTHROSCOPY     NO PAST SURGERIES     RIGHT HEART CATH N/A 11/18/2020   Procedure: RIGHT HEART CATH;  Surgeon: Jolaine Artist, MD;  Location: [facility];  Service: Cardiovascular;  Laterality: N/A;   RIGHT/LEFT HEART CATH AND CORONARY ANGIOGRAPHY N/A 05/21/2020   Procedure: RIGHT/LEFT HEART CATH AND CORONARY ANGIOGRAPHY;  Surgeon: Martinique, Peter M, MD;  Location: [facility];  Service: Cardiovascular;  Laterality: N/A;    Allergies  Allergen Reactions   Bactrim [Sulfamethoxazole-Trimethoprim] Shortness Of Breath    Patient states "got short winded"   Spiriva Respimat [Tiotropium Bromide Monohydrate] Rash   Ofev [Nintedanib]     Immunization History  Administered Date(s) Administered   Fluad Quad(high Dose 65+)  08/12/2020   Influenza Split 08/03/2011, 07/03/2012, 09/02/2013, 07/03/2014   Influenza, High Dose Seasonal PF 07/30/2014, 07/15/2016, 08/02/2016, 08/02/2017, 07/25/2018, 07/04/2019   Influenza, Quadrivalent, Recombinant, Inj, Pf 08/11/2017, 07/05/2018, 07/11/2019, 08/15/2020, 07/28/2021   Influenza,inj,Quad PF,6+ Mos 07/29/2015   Influenza-Unspecified 07/23/2012, 08/24/2022   Moderna Sars-Covid-2 Vaccination 08/01/2021   PFIZER(Purple Top)SARS-COV-2 Vaccination 01/01/2020, 01/13/2020, 02/05/2020, 08/31/2020   Pneumococcal Conjugate-13 08/20/2015, 08/01/2018   Pneumococcal Polysaccharide-23 11/03/2007, 12/04/2007, 01/12/2018   Zoster Recombinat (Shingrix) 04/11/2019    Family History  Problem Relation Age of Onset   Diabetes Mother    Stroke Mother      Current Outpatient Medications:    acetaminophen (TYLENOL) 500 MG tablet, Take 1,000 mg by mouth every 6 (six) hours as needed for moderate pain or headache., Disp: , Rfl:    albuterol (VENTOLIN HFA) 108 (90 Base) MCG/ACT inhaler, Inhale 2 puffs into the lungs every 6 (six) hours as needed., Disp: , Rfl:    alfuzosin (UROXATRAL) 10 MG 24 hr tablet, Take 1 tablet (10 mg total) by mouth daily with breakfast., Disp: 30 tablet, Rfl: 11   amLODipine (NORVASC) 10 MG tablet, Take 10 mg by mouth daily., Disp: , Rfl:    aspirin 81 MG tablet, Take 81 mg by mouth daily., Disp: , Rfl:    atorvastatin (LIPITOR) 20 MG tablet, Take 20 mg by mouth daily., Disp: , Rfl: 2   azaTHIOprine (IMURAN) 50 MG tablet, Take 100 mg by mouth daily. , Disp: , Rfl:    ciprofloxacin (CIPRO) 500 MG tablet, Take 1 tablet (500 mg total) by mouth every 12 (twelve) hours., Disp: 14 tablet, Rfl: 0   finasteride (PROSCAR) 5 MG tablet, Take 1 tablet (5 mg total) by mouth daily., Disp: 90 tablet, Rfl: 3   fluticasone (FLONASE) 50 MCG/ACT nasal spray, Place 2 sprays into both nostrils daily., Disp: 16 g, Rfl: 2   folic acid (FOLVITE) 569 MCG tablet, Take 400 mcg by mouth daily.,  Disp: , Rfl:    HYDROcodone bit-homatropine (HYCODAN) 5-1.5 MG/5ML syrup, Take 5 mLs by mouth 2 (two) times daily as needed for cough., Disp: 240 mL, Rfl: 0   insulin NPH Human (NOVOLIN N) 100 UNIT/ML injection, Inject 12 Units into the skin See admin instructions. Inject 10 units  every morning and 20 units every evening, Disp: , Rfl:    Insulin Syringe-Needle U-100 (INSULIN SYRINGE .3CC/31GX5/16") 31G X 5/16" 0.3 ML MISC, Inject 1 Syringe into the skin as directed., Disp: , Rfl:    levothyroxine (SYNTHROID, LEVOTHROID) 25 MCG tablet, Take 25 mcg by mouth daily., Disp: , Rfl:    metFORMIN (GLUCOPHAGE-XR) 500 MG 24 hr tablet, Take 2 tablets (1,000 mg total) by mouth 2 (two) times daily., Disp: , Rfl:    pantoprazole (PROTONIX) 40 MG tablet, Take 40 mg by mouth daily., Disp: , Rfl:    predniSONE (DELTASONE) 1 MG tablet, Take 3 mg by mouth daily., Disp: , Rfl:    Treprostinil (TYVASO DPI MAINTENANCE KIT) 48 MCG POWD, Inhale 48 mcg into the lungs in the morning, at noon, in the evening, and at bedtime., Disp: , Rfl:    umeclidinium bromide (INCRUSE ELLIPTA) 62.5 MCG/ACT AEPB, Inhale 1 puff into the lungs daily., Disp: 30 each, Rfl: 5      Objective:   Vitals:   10/23/22 1118  BP: (!) 116/58  Pulse: 82  Temp: 97.8 F (36.6 C)  TempSrc: Oral  SpO2: 92%  Weight: 145 lb (65.8 kg)  Height: _0  (1.676 m)    Estimated body mass index is 23.4 kg/m as calculated from the following:   Height as of this encounter: _1  (1.676 m).   Weight as of this encounter: 145 lb (65.8 kg).  _2 @  Filed Weights   10/23/22 1118  Weight: 145 lb (65.8 kg)     Physical Exam    General: No distress.  Thin male with rheumatoid arthritis deformities and clubbing.  Deconditioned.  Has oxygen on Neuro: Alert and Oriented x 3. GCS 15. Speech normal Psych: Pleasant Resp:  Barrel Chest - no.  Wheeze - no, Crackles - yes, No overt respiratory distress CVS: Normal heart sounds. Murmurs - no Ext:  Stigmata of Connective Tissue Disease - RA HEENT: Normal upper airway. PEERL +. No post nasal drip        Assessment:       ICD-10-CM   1. Chronic respiratory failure with hypoxia (HCC)  J96.11     2. Interstitial lung disease due to connective tissue disease ([place])  J84.89 Pulmonary Function Test   M35.9     3. Immunosuppressed status ([place])  D84.9     4. WHO group 3 pulmonary arterial hypertension (HCC)  I27.23     5. Weight loss  R63.4     6. High risk medication use  Z79.899          Plan:     Patient Instructions  Interstitial lung disease due to connective tissue disease ([place]) Chronic respiratory failure with hypoxia (HCC) Immunosuppressed and on high risk medication  - Progressive worsening of pulmonary fibrosis on symptoms, CT and PFT but since last visti PFT stable - worried outlook is bad   Plan - continue o2 at 4L [place] - do spiro/dlco in 3 months -At this point in time no antifibrotic's because of side effects --Continue supportive care with oxygen therapy and physical conditioning -Immunomodulator therapy   - Rituxan last dose approx 10/20/22 -> our office Phmaracy team is changing you to Acterma subcut injection   - once you are on Actemra we will inform Dr Kathlene November  - prednisone to continue as before -Continue participation in ILD-pro registry - if you decline to point you cannot do simple things at home we will have to discuss hospice but for  now let us see if changing Rituxan to Actemra can help you  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  and tyvaso  -controlled this visit  Plan  - hycodan 68m twice daily as neded  (if you want refill on this)   WHO group 3 pulmonary arterial hypertension ([place]- Jan 2022  - you are inhaled treprostinil since late February 2022/early March 2022   Plan -  continue inhaled treprostinil   DPI inhaler 4 times daily - our pharmacy team working on authorizatio for 2024  Mild emphysema  Plan  -incruse  Weight loss -  abnormal  Plan'  - via PCP   Follow-up - Return to see Dr. RChase Callerfor standard of care visit in 30-minute slot in 3-4 months but after PFT  = ild symptom score and walk test at followup    ( Level 05 visit: Estb 40-54 min   in  visit type: on-site physical face to visit  in total care time and counseling or/and coordination of care by this undersigned MD - Dr MBrand Males This includes one or more of the following on this same day 10/23/2022: pre-charting, chart review, note writing, documentation discussion of test results, diagnostic or treatment recommendations, prognosis, risks and benefits of management options, instructions, education, compliance or risk-factor reduction. It excludes time spent by the [place] office staff in the care of the patient. Actual time 40 min)   SIGNATURE    Dr. MBrand Males [name], [name],  Pulmonary and Critical Care Medicine Staff Physician, CHumptulipsDirector - Interstitial Lung Disease  Program  Pulmonary [place] [address] Pager: [phone] If no answer or between  15:00h - 7:00h: call [phone] Telephone: [phone]  1:01 PM 10/23/2022

## 2022-10-27 ENCOUNTER — Telehealth: Payer: Self-pay | Admitting: Internal Medicine

## 2022-10-27 DIAGNOSIS — J9611 Chronic respiratory failure with hypoxia: Secondary | ICD-10-CM

## 2022-10-27 DIAGNOSIS — J8489 Other specified interstitial pulmonary diseases: Secondary | ICD-10-CM

## 2022-10-27 DIAGNOSIS — M359 Systemic involvement of connective tissue, unspecified: Secondary | ICD-10-CM

## 2022-10-27 NOTE — Telephone Encounter (Signed)
Called and spoke with pt who states he is wanting to switch DMEs from [employer] to [employer] for his O2. Pt said he has contacted [employer] about this already.  Tammy, please advise if you are okay with [place] placing order under you.

## 2022-10-27 NOTE — Telephone Encounter (Signed)
Per Dr. Chase Caller , he would like patient started on Actemra sooner than 6 months when his next Rituxan would be due.  Dr. Chase Caller advised that auth for Tonie Griffith is already approved.  ATC patient to discuss. VM box is not set up. Unable to reach. Will f/u  Knox Saliva, PharmD, MPH, BCPS, CPP Clinical Pharmacist (Rheumatology and Pulmonology)

## 2022-10-27 NOTE — Telephone Encounter (Signed)
That is fine with me.

## 2022-10-27 NOTE — Telephone Encounter (Signed)
Order placed under TP. Nothing further needed.

## 2022-10-27 NOTE — Telephone Encounter (Signed)
PULSE OXIMETRY TESTING:     Document patient's oxygen at rest on room air:    _____% on room air, at rest (No ranges please)     _____% on oxygen, at rest at_____LPM via [place]    IF 88% OR BELOW on room air, STOP HERE    IF NOT ambulate patient on room air with exertion and document below:     _____% on room air, with exertion (must be 88% or below)     _____% on oxygen, with exertion, at_____LPM via [place]    All three tests must be during the same session.     Please document in a PROGRESS NOTE.     Testing to qualify for home oxygen must be no earlier than 48 hours prior to discharge, or it will need to be repeated.     Please call the home health care liaison at [phone] when completed  Per Dr. Chase Caller , he would like patient started on Actemra sooner than 6 months when his next Rituxan would be due. Last Rituxan was  10/21/2022.  D/r. Ramaswamy advised that auth for Tonie Griffith is already approved.   ATC patient to discuss. VM box is not set up. Unable to reach. Will f/u   Knox Saliva, PharmD, MPH, BCPS, CPP Clinical Pharmacist (Rheumatology and Pulmonology)

## 2022-10-28 NOTE — Telephone Encounter (Signed)
Called and spoke with patient, provided information per Dr. Chase Caller.  He verbalized understanding.  He was agreeable to having the HR CT scan ordered.  I advised him that one of our PCCs will call him to get that scheduled.  Nothing further needed.

## 2022-10-29 ENCOUNTER — Telehealth: Payer: Self-pay | Admitting: Internal Medicine

## 2022-10-29 NOTE — Telephone Encounter (Signed)
Spoke with patient. Will plan to start Actemra on 11/16/2022 for now (after his PFTs that same day).  Will call Dr. Amil Amen (rheumatology) to confirm that he is onboard.Knox Saliva, PharmD, MPH, BCPS, CPP Clinical Pharmacist (Rheumatology and Pulmonology)

## 2022-10-29 NOTE — Telephone Encounter (Signed)
Spoke with pt who is wondering if PFT scheduled in January 2024 is necessary r/t just have completed on in mid December 2023. Reviewed pt's AVS from prior OV (10/23/22) which states f/u in 3 - 4 months after PFT completed. Dr. Chase Caller would you like pt's PFT to be moved to mid March - mid April 2024? Please advise.

## 2022-10-30 NOTE — Telephone Encounter (Signed)
ATC  someone answered and never said a word. Pt PFT in Jan 2024 is canceled. He will repeat in Spring 2024. Nothing further needed.

## 2022-10-30 NOTE — Telephone Encounter (Signed)
Pft only at time of followup in spring 2024

## 2022-11-02 DIAGNOSIS — R0902 Hypoxemia: Secondary | ICD-10-CM | POA: Diagnosis not present

## 2022-11-05 DIAGNOSIS — R069 Unspecified abnormalities of breathing: Secondary | ICD-10-CM | POA: Diagnosis not present

## 2022-11-05 DIAGNOSIS — R531 Weakness: Secondary | ICD-10-CM | POA: Diagnosis not present

## 2022-11-06 ENCOUNTER — Other Ambulatory Visit (HOSPITAL_COMMUNITY): Payer: Self-pay

## 2022-11-06 DIAGNOSIS — J440 Chronic obstructive pulmonary disease with acute lower respiratory infection: Secondary | ICD-10-CM | POA: Diagnosis not present

## 2022-11-06 DIAGNOSIS — Z792 Long term (current) use of antibiotics: Secondary | ICD-10-CM | POA: Diagnosis not present

## 2022-11-06 DIAGNOSIS — U071 COVID-19: Secondary | ICD-10-CM | POA: Diagnosis not present

## 2022-11-06 DIAGNOSIS — Z9989 Dependence on other enabling machines and devices: Secondary | ICD-10-CM | POA: Diagnosis not present

## 2022-11-06 DIAGNOSIS — E119 Type 2 diabetes mellitus without complications: Secondary | ICD-10-CM | POA: Diagnosis not present

## 2022-11-06 DIAGNOSIS — I499 Cardiac arrhythmia, unspecified: Secondary | ICD-10-CM | POA: Diagnosis not present

## 2022-11-06 DIAGNOSIS — E872 Acidosis, unspecified: Secondary | ICD-10-CM | POA: Diagnosis not present

## 2022-11-06 DIAGNOSIS — R6889 Other general symptoms and signs: Secondary | ICD-10-CM | POA: Diagnosis not present

## 2022-11-06 DIAGNOSIS — Z66 Do not resuscitate: Secondary | ICD-10-CM | POA: Diagnosis not present

## 2022-11-06 DIAGNOSIS — J441 Chronic obstructive pulmonary disease with (acute) exacerbation: Secondary | ICD-10-CM | POA: Diagnosis not present

## 2022-11-06 DIAGNOSIS — B957 Other staphylococcus as the cause of diseases classified elsewhere: Secondary | ICD-10-CM | POA: Diagnosis not present

## 2022-11-06 DIAGNOSIS — R0902 Hypoxemia: Secondary | ICD-10-CM | POA: Diagnosis not present

## 2022-11-06 DIAGNOSIS — Z743 Need for continuous supervision: Secondary | ICD-10-CM | POA: Diagnosis not present

## 2022-11-06 DIAGNOSIS — E1165 Type 2 diabetes mellitus with hyperglycemia: Secondary | ICD-10-CM | POA: Diagnosis not present

## 2022-11-06 DIAGNOSIS — A419 Sepsis, unspecified organism: Secondary | ICD-10-CM | POA: Diagnosis not present

## 2022-11-06 DIAGNOSIS — B9689 Other specified bacterial agents as the cause of diseases classified elsewhere: Secondary | ICD-10-CM | POA: Diagnosis not present

## 2022-11-06 DIAGNOSIS — Z9981 Dependence on supplemental oxygen: Secondary | ICD-10-CM | POA: Diagnosis not present

## 2022-11-06 DIAGNOSIS — J1282 Pneumonia due to coronavirus disease 2019: Secondary | ICD-10-CM | POA: Diagnosis not present

## 2022-11-06 DIAGNOSIS — Z20822 Contact with and (suspected) exposure to covid-19: Secondary | ICD-10-CM | POA: Diagnosis not present

## 2022-11-06 DIAGNOSIS — Z794 Long term (current) use of insulin: Secondary | ICD-10-CM | POA: Diagnosis not present

## 2022-11-06 DIAGNOSIS — Z7901 Long term (current) use of anticoagulants: Secondary | ICD-10-CM | POA: Diagnosis not present

## 2022-11-06 DIAGNOSIS — E876 Hypokalemia: Secondary | ICD-10-CM | POA: Diagnosis not present

## 2022-11-06 DIAGNOSIS — J9621 Acute and chronic respiratory failure with hypoxia: Secondary | ICD-10-CM | POA: Diagnosis not present

## 2022-11-06 DIAGNOSIS — D649 Anemia, unspecified: Secondary | ICD-10-CM | POA: Diagnosis not present

## 2022-11-06 DIAGNOSIS — R059 Cough, unspecified: Secondary | ICD-10-CM | POA: Diagnosis not present

## 2022-11-06 DIAGNOSIS — R0602 Shortness of breath: Secondary | ICD-10-CM | POA: Diagnosis not present

## 2022-11-06 DIAGNOSIS — R54 Age-related physical debility: Secondary | ICD-10-CM | POA: Diagnosis not present

## 2022-11-06 DIAGNOSIS — R7881 Bacteremia: Secondary | ICD-10-CM | POA: Diagnosis not present

## 2022-11-06 DIAGNOSIS — Z79899 Other long term (current) drug therapy: Secondary | ICD-10-CM | POA: Diagnosis not present

## 2022-11-06 DIAGNOSIS — J849 Interstitial pulmonary disease, unspecified: Secondary | ICD-10-CM | POA: Diagnosis not present

## 2022-11-06 DIAGNOSIS — M069 Rheumatoid arthritis, unspecified: Secondary | ICD-10-CM | POA: Diagnosis not present

## 2022-11-06 DIAGNOSIS — R0603 Acute respiratory distress: Secondary | ICD-10-CM | POA: Diagnosis not present

## 2022-11-06 DIAGNOSIS — J9601 Acute respiratory failure with hypoxia: Secondary | ICD-10-CM | POA: Diagnosis not present

## 2022-11-06 MED ORDER — ACTEMRA ACTPEN 162 MG/0.9ML ~~LOC~~ SOAJ
162.0000 mg | SUBCUTANEOUS | 0 refills | Status: DC
  Filled 2022-11-06: qty 1.8, fill #0

## 2022-11-06 NOTE — Telephone Encounter (Signed)
Rx for Actemra sent to [facility] to be courired to clinic by 11/16/2021.  ATC Dr. Amil Amen and/or Dr. Kathlene November. Both offices are closed Friday afternoons. Will try again later.  Knox Saliva, PharmD, MPH, BCPS, CPP Clinical Pharmacist (Rheumatology and Pulmonology)

## 2022-11-09 ENCOUNTER — Other Ambulatory Visit (HOSPITAL_COMMUNITY): Payer: Self-pay

## 2022-11-09 NOTE — Telephone Encounter (Signed)
Pt currently in hospital, will attempt later f/u

## 2022-11-10 ENCOUNTER — Ambulatory Visit (HOSPITAL_COMMUNITY): Admission: RE | Admit: 2022-11-10 | Payer: Medicare Other | Source: Ambulatory Visit

## 2022-11-11 ENCOUNTER — Other Ambulatory Visit (HOSPITAL_COMMUNITY): Payer: Self-pay

## 2022-11-12 ENCOUNTER — Encounter: Payer: Self-pay | Admitting: Pharmacist

## 2022-11-12 NOTE — Telephone Encounter (Signed)
Appt for Actemra has been cancelled since patient is hospitalized. MyChart message sent to pt today. I'll be in touch post-hospitalization  Knox Saliva, PharmD, MPH, BCPS, CPP Clinical Pharmacist (Rheumatology and Pulmonology)

## 2022-11-16 ENCOUNTER — Other Ambulatory Visit: Payer: Medicare Other | Admitting: Pharmacist

## 2022-11-16 DIAGNOSIS — N182 Chronic kidney disease, stage 2 (mild): Secondary | ICD-10-CM | POA: Diagnosis not present

## 2022-11-16 DIAGNOSIS — J849 Interstitial pulmonary disease, unspecified: Secondary | ICD-10-CM | POA: Diagnosis not present

## 2022-11-16 DIAGNOSIS — E782 Mixed hyperlipidemia: Secondary | ICD-10-CM | POA: Diagnosis not present

## 2022-11-16 DIAGNOSIS — R634 Abnormal weight loss: Secondary | ICD-10-CM | POA: Diagnosis not present

## 2022-11-16 DIAGNOSIS — J9611 Chronic respiratory failure with hypoxia: Secondary | ICD-10-CM | POA: Diagnosis not present

## 2022-11-16 DIAGNOSIS — E1022 Type 1 diabetes mellitus with diabetic chronic kidney disease: Secondary | ICD-10-CM | POA: Diagnosis not present

## 2022-11-16 DIAGNOSIS — I10 Essential (primary) hypertension: Secondary | ICD-10-CM | POA: Diagnosis not present

## 2022-11-16 DIAGNOSIS — I700 Atherosclerosis of aorta: Secondary | ICD-10-CM | POA: Diagnosis not present

## 2022-11-17 ENCOUNTER — Other Ambulatory Visit: Payer: Self-pay | Admitting: Pharmacist

## 2022-11-17 DIAGNOSIS — M359 Systemic involvement of connective tissue, unspecified: Secondary | ICD-10-CM

## 2022-11-17 DIAGNOSIS — I2723 Pulmonary hypertension due to lung diseases and hypoxia: Secondary | ICD-10-CM

## 2022-11-17 DIAGNOSIS — D849 Immunodeficiency, unspecified: Secondary | ICD-10-CM

## 2022-11-17 DIAGNOSIS — J8489 Other specified interstitial pulmonary diseases: Secondary | ICD-10-CM

## 2022-11-17 MED ORDER — TYVASO DPI MAINTENANCE KIT 48 MCG IN POWD: 48.0000 ug | Freq: Four times a day (QID) | RESPIRATORY_TRACT | 11 refills | Status: DC

## 2022-11-17 NOTE — Telephone Encounter (Signed)
Refill sent for TYVASO DPI to [place]: [phone]  Dose: 70mg four times daily  Last OV: 10/23/2022 Provider: Dr. RChase Caller Next OV: 11/23/2022  DKnox Saliva PharmD, MPH, BCPS Clinical Pharmacist (Rheumatology and Pulmonology)

## 2022-11-22 DIAGNOSIS — M069 Rheumatoid arthritis, unspecified: Secondary | ICD-10-CM | POA: Diagnosis not present

## 2022-11-22 DIAGNOSIS — J8410 Pulmonary fibrosis, unspecified: Secondary | ICD-10-CM | POA: Diagnosis not present

## 2022-11-22 DIAGNOSIS — R509 Fever, unspecified: Secondary | ICD-10-CM | POA: Diagnosis not present

## 2022-11-22 DIAGNOSIS — J441 Chronic obstructive pulmonary disease with (acute) exacerbation: Secondary | ICD-10-CM | POA: Diagnosis not present

## 2022-11-22 DIAGNOSIS — D649 Anemia, unspecified: Secondary | ICD-10-CM | POA: Diagnosis not present

## 2022-11-22 DIAGNOSIS — R54 Age-related physical debility: Secondary | ICD-10-CM | POA: Diagnosis not present

## 2022-11-22 DIAGNOSIS — J849 Interstitial pulmonary disease, unspecified: Secondary | ICD-10-CM | POA: Diagnosis not present

## 2022-11-22 DIAGNOSIS — E870 Hyperosmolality and hypernatremia: Secondary | ICD-10-CM | POA: Diagnosis not present

## 2022-11-22 DIAGNOSIS — R6520 Severe sepsis without septic shock: Secondary | ICD-10-CM | POA: Diagnosis not present

## 2022-11-22 DIAGNOSIS — J9621 Acute and chronic respiratory failure with hypoxia: Secondary | ICD-10-CM | POA: Diagnosis not present

## 2022-11-22 DIAGNOSIS — Z792 Long term (current) use of antibiotics: Secondary | ICD-10-CM | POA: Diagnosis not present

## 2022-11-22 DIAGNOSIS — Z794 Long term (current) use of insulin: Secondary | ICD-10-CM | POA: Diagnosis not present

## 2022-11-22 DIAGNOSIS — E119 Type 2 diabetes mellitus without complications: Secondary | ICD-10-CM | POA: Diagnosis not present

## 2022-11-22 DIAGNOSIS — Z743 Need for continuous supervision: Secondary | ICD-10-CM | POA: Diagnosis not present

## 2022-11-22 DIAGNOSIS — A419 Sepsis, unspecified organism: Secondary | ICD-10-CM | POA: Diagnosis not present

## 2022-11-22 DIAGNOSIS — Z9981 Dependence on supplemental oxygen: Secondary | ICD-10-CM | POA: Diagnosis not present

## 2022-11-22 DIAGNOSIS — U071 COVID-19: Secondary | ICD-10-CM | POA: Diagnosis not present

## 2022-11-22 DIAGNOSIS — Z87891 Personal history of nicotine dependence: Secondary | ICD-10-CM | POA: Diagnosis not present

## 2022-11-22 DIAGNOSIS — R918 Other nonspecific abnormal finding of lung field: Secondary | ICD-10-CM | POA: Diagnosis not present

## 2022-11-22 DIAGNOSIS — Z8616 Personal history of COVID-19: Secondary | ICD-10-CM | POA: Diagnosis not present

## 2022-11-22 DIAGNOSIS — E860 Dehydration: Secondary | ICD-10-CM | POA: Diagnosis not present

## 2022-11-22 DIAGNOSIS — R404 Transient alteration of awareness: Secondary | ICD-10-CM | POA: Diagnosis not present

## 2022-11-22 DIAGNOSIS — Z79899 Other long term (current) drug therapy: Secondary | ICD-10-CM | POA: Diagnosis not present

## 2022-11-22 DIAGNOSIS — J1282 Pneumonia due to coronavirus disease 2019: Secondary | ICD-10-CM | POA: Diagnosis not present

## 2022-11-22 DIAGNOSIS — Z7952 Long term (current) use of systemic steroids: Secondary | ICD-10-CM | POA: Diagnosis not present

## 2022-11-22 DIAGNOSIS — E1165 Type 2 diabetes mellitus with hyperglycemia: Secondary | ICD-10-CM | POA: Diagnosis not present

## 2022-11-22 DIAGNOSIS — I10 Essential (primary) hypertension: Secondary | ICD-10-CM | POA: Diagnosis not present

## 2022-11-22 DIAGNOSIS — J9622 Acute and chronic respiratory failure with hypercapnia: Secondary | ICD-10-CM | POA: Diagnosis not present

## 2022-11-22 DIAGNOSIS — Z882 Allergy status to sulfonamides status: Secondary | ICD-10-CM | POA: Diagnosis not present

## 2022-11-22 DIAGNOSIS — Z66 Do not resuscitate: Secondary | ICD-10-CM | POA: Diagnosis not present

## 2022-11-22 DIAGNOSIS — R0600 Dyspnea, unspecified: Secondary | ICD-10-CM | POA: Diagnosis not present

## 2022-11-22 DIAGNOSIS — R0689 Other abnormalities of breathing: Secondary | ICD-10-CM | POA: Diagnosis not present

## 2022-11-22 DIAGNOSIS — I499 Cardiac arrhythmia, unspecified: Secondary | ICD-10-CM | POA: Diagnosis not present

## 2022-11-22 DIAGNOSIS — D61818 Other pancytopenia: Secondary | ICD-10-CM | POA: Diagnosis not present

## 2022-11-22 DIAGNOSIS — D696 Thrombocytopenia, unspecified: Secondary | ICD-10-CM | POA: Diagnosis not present

## 2022-11-22 DIAGNOSIS — E43 Unspecified severe protein-calorie malnutrition: Secondary | ICD-10-CM | POA: Diagnosis not present

## 2022-11-22 DIAGNOSIS — Z9989 Dependence on other enabling machines and devices: Secondary | ICD-10-CM | POA: Diagnosis not present

## 2022-11-22 DIAGNOSIS — J440 Chronic obstructive pulmonary disease with acute lower respiratory infection: Secondary | ICD-10-CM | POA: Diagnosis not present

## 2022-11-23 ENCOUNTER — Inpatient Hospital Stay: Payer: Medicare Other | Admitting: Adult Health

## 2022-11-23 ENCOUNTER — Ambulatory Visit: Payer: Medicare Other | Admitting: Urology

## 2022-11-23 DIAGNOSIS — J441 Chronic obstructive pulmonary disease with (acute) exacerbation: Secondary | ICD-10-CM | POA: Diagnosis not present

## 2022-11-23 DIAGNOSIS — J9621 Acute and chronic respiratory failure with hypoxia: Secondary | ICD-10-CM | POA: Diagnosis not present

## 2022-11-23 DIAGNOSIS — Z9981 Dependence on supplemental oxygen: Secondary | ICD-10-CM | POA: Diagnosis not present

## 2022-11-23 DIAGNOSIS — Z8616 Personal history of COVID-19: Secondary | ICD-10-CM | POA: Diagnosis not present

## 2022-11-23 DIAGNOSIS — D649 Anemia, unspecified: Secondary | ICD-10-CM | POA: Diagnosis not present

## 2022-11-23 DIAGNOSIS — E119 Type 2 diabetes mellitus without complications: Secondary | ICD-10-CM | POA: Diagnosis not present

## 2022-11-24 DIAGNOSIS — E119 Type 2 diabetes mellitus without complications: Secondary | ICD-10-CM | POA: Diagnosis not present

## 2022-11-24 DIAGNOSIS — Z794 Long term (current) use of insulin: Secondary | ICD-10-CM | POA: Diagnosis not present

## 2022-11-24 DIAGNOSIS — D649 Anemia, unspecified: Secondary | ICD-10-CM | POA: Diagnosis not present

## 2022-11-24 DIAGNOSIS — J1282 Pneumonia due to coronavirus disease 2019: Secondary | ICD-10-CM | POA: Diagnosis not present

## 2022-11-24 DIAGNOSIS — Z9989 Dependence on other enabling machines and devices: Secondary | ICD-10-CM | POA: Diagnosis not present

## 2022-11-24 DIAGNOSIS — D696 Thrombocytopenia, unspecified: Secondary | ICD-10-CM | POA: Diagnosis not present

## 2022-11-24 DIAGNOSIS — Z792 Long term (current) use of antibiotics: Secondary | ICD-10-CM | POA: Diagnosis not present

## 2022-11-24 DIAGNOSIS — Z9981 Dependence on supplemental oxygen: Secondary | ICD-10-CM | POA: Diagnosis not present

## 2022-11-24 DIAGNOSIS — R54 Age-related physical debility: Secondary | ICD-10-CM | POA: Diagnosis not present

## 2022-11-24 DIAGNOSIS — U071 COVID-19: Secondary | ICD-10-CM | POA: Diagnosis not present

## 2022-11-24 DIAGNOSIS — J9621 Acute and chronic respiratory failure with hypoxia: Secondary | ICD-10-CM | POA: Diagnosis not present

## 2022-11-30 DIAGNOSIS — D649 Anemia, unspecified: Secondary | ICD-10-CM | POA: Diagnosis not present

## 2022-11-30 DIAGNOSIS — U071 COVID-19: Secondary | ICD-10-CM | POA: Diagnosis not present

## 2022-11-30 DIAGNOSIS — D696 Thrombocytopenia, unspecified: Secondary | ICD-10-CM | POA: Diagnosis not present

## 2022-11-30 DIAGNOSIS — Z9981 Dependence on supplemental oxygen: Secondary | ICD-10-CM | POA: Diagnosis not present

## 2022-11-30 DIAGNOSIS — E119 Type 2 diabetes mellitus without complications: Secondary | ICD-10-CM | POA: Diagnosis not present

## 2022-11-30 DIAGNOSIS — Z7952 Long term (current) use of systemic steroids: Secondary | ICD-10-CM | POA: Diagnosis not present

## 2022-11-30 DIAGNOSIS — J849 Interstitial pulmonary disease, unspecified: Secondary | ICD-10-CM | POA: Diagnosis not present

## 2022-11-30 DIAGNOSIS — J9621 Acute and chronic respiratory failure with hypoxia: Secondary | ICD-10-CM | POA: Diagnosis not present

## 2022-11-30 DIAGNOSIS — M069 Rheumatoid arthritis, unspecified: Secondary | ICD-10-CM | POA: Diagnosis not present

## 2022-11-30 DIAGNOSIS — R918 Other nonspecific abnormal finding of lung field: Secondary | ICD-10-CM | POA: Diagnosis not present

## 2022-11-30 DIAGNOSIS — R54 Age-related physical debility: Secondary | ICD-10-CM | POA: Diagnosis not present

## 2022-11-30 DIAGNOSIS — E43 Unspecified severe protein-calorie malnutrition: Secondary | ICD-10-CM | POA: Diagnosis not present

## 2022-12-01 ENCOUNTER — Telehealth: Payer: Self-pay | Admitting: Internal Medicine

## 2022-12-01 DIAGNOSIS — Z7409 Other reduced mobility: Secondary | ICD-10-CM

## 2022-12-01 DIAGNOSIS — R531 Weakness: Secondary | ICD-10-CM

## 2022-12-01 NOTE — Telephone Encounter (Signed)
Patient's spouse would like the nurse to call to regarding the patient.  She stated that the doctor told her to call if she needed assistance with the patient.  She said since the patient came home from the hospital in [place] last night, he is not doing good.  Please call spouse to discuss further.  CB# [phone]

## 2022-12-02 NOTE — Telephone Encounter (Signed)
Spoke with the pt's spouse  She is asking for referral for the pt to get home PT and a wheelchair  She states he is having issues with strength and with walking due to SOB  She feels a wheelchair would make it easier to get him to and from his appts  Please advise thanks

## 2022-12-03 DIAGNOSIS — R0902 Hypoxemia: Secondary | ICD-10-CM | POA: Diagnosis not present

## 2022-12-03 NOTE — Telephone Encounter (Signed)
Yes this is fine.

## 2022-12-03 NOTE — Telephone Encounter (Signed)
Orders are placed for wheelchair and home PT for patient. Called and updated patient and wife that I placed the orders and that when it gets processed with insurance someone will reach out to get this set up with them. Nothing further needed

## 2022-12-04 ENCOUNTER — Telehealth: Payer: Self-pay | Admitting: Internal Medicine

## 2022-12-04 DIAGNOSIS — J8489 Other specified interstitial pulmonary diseases: Secondary | ICD-10-CM

## 2022-12-04 DIAGNOSIS — Z7409 Other reduced mobility: Secondary | ICD-10-CM

## 2022-12-04 DIAGNOSIS — R531 Weakness: Secondary | ICD-10-CM

## 2022-12-07 ENCOUNTER — Inpatient Hospital Stay (HOSPITAL_COMMUNITY)
Admission: EM | Admit: 2022-12-07 | Discharge: 2022-12-23 | DRG: 177 | Disposition: A | Discharge status: Skilled Nursing Facility | Payer: Medicare Other | Attending: Internal Medicine | Admitting: Internal Medicine

## 2022-12-07 ENCOUNTER — Emergency Department (HOSPITAL_COMMUNITY): Payer: Medicare Other

## 2022-12-07 ENCOUNTER — Other Ambulatory Visit: Payer: Self-pay

## 2022-12-07 DIAGNOSIS — J988 Other specified respiratory disorders: Secondary | ICD-10-CM | POA: Diagnosis not present

## 2022-12-07 DIAGNOSIS — Z515 Encounter for palliative care: Secondary | ICD-10-CM | POA: Diagnosis not present

## 2022-12-07 DIAGNOSIS — U071 COVID-19: Principal | ICD-10-CM | POA: Diagnosis present

## 2022-12-07 DIAGNOSIS — Z7952 Long term (current) use of systemic steroids: Secondary | ICD-10-CM

## 2022-12-07 DIAGNOSIS — I509 Heart failure, unspecified: Secondary | ICD-10-CM | POA: Diagnosis present

## 2022-12-07 DIAGNOSIS — I2609 Other pulmonary embolism with acute cor pulmonale: Secondary | ICD-10-CM | POA: Diagnosis not present

## 2022-12-07 DIAGNOSIS — Z794 Long term (current) use of insulin: Secondary | ICD-10-CM | POA: Diagnosis not present

## 2022-12-07 DIAGNOSIS — I10 Essential (primary) hypertension: Secondary | ICD-10-CM | POA: Diagnosis not present

## 2022-12-07 DIAGNOSIS — J8410 Pulmonary fibrosis, unspecified: Secondary | ICD-10-CM | POA: Diagnosis present

## 2022-12-07 DIAGNOSIS — R55 Syncope and collapse: Secondary | ICD-10-CM | POA: Diagnosis not present

## 2022-12-07 DIAGNOSIS — J181 Lobar pneumonia, unspecified organism: Secondary | ICD-10-CM | POA: Diagnosis present

## 2022-12-07 DIAGNOSIS — Z9981 Dependence on supplemental oxygen: Secondary | ICD-10-CM

## 2022-12-07 DIAGNOSIS — E119 Type 2 diabetes mellitus without complications: Secondary | ICD-10-CM

## 2022-12-07 DIAGNOSIS — J1282 Pneumonia due to coronavirus disease 2019: Secondary | ICD-10-CM | POA: Diagnosis present

## 2022-12-07 DIAGNOSIS — I2721 Secondary pulmonary arterial hypertension: Secondary | ICD-10-CM | POA: Diagnosis not present

## 2022-12-07 DIAGNOSIS — D696 Thrombocytopenia, unspecified: Secondary | ICD-10-CM | POA: Diagnosis not present

## 2022-12-07 DIAGNOSIS — I272 Pulmonary hypertension, unspecified: Secondary | ICD-10-CM | POA: Diagnosis present

## 2022-12-07 DIAGNOSIS — I110 Hypertensive heart disease with heart failure: Secondary | ICD-10-CM | POA: Diagnosis present

## 2022-12-07 DIAGNOSIS — E785 Hyperlipidemia, unspecified: Secondary | ICD-10-CM | POA: Diagnosis not present

## 2022-12-07 DIAGNOSIS — Z743 Need for continuous supervision: Secondary | ICD-10-CM | POA: Diagnosis not present

## 2022-12-07 DIAGNOSIS — M1990 Unspecified osteoarthritis, unspecified site: Secondary | ICD-10-CM | POA: Diagnosis present

## 2022-12-07 DIAGNOSIS — D61818 Other pancytopenia: Secondary | ICD-10-CM | POA: Diagnosis not present

## 2022-12-07 DIAGNOSIS — J439 Emphysema, unspecified: Secondary | ICD-10-CM | POA: Diagnosis present

## 2022-12-07 DIAGNOSIS — L8990 Pressure ulcer of unspecified site, unspecified stage: Secondary | ICD-10-CM | POA: Diagnosis present

## 2022-12-07 DIAGNOSIS — Z833 Family history of diabetes mellitus: Secondary | ICD-10-CM

## 2022-12-07 DIAGNOSIS — L89152 Pressure ulcer of sacral region, stage 2: Secondary | ICD-10-CM | POA: Diagnosis present

## 2022-12-07 DIAGNOSIS — Z7189 Other specified counseling: Secondary | ICD-10-CM | POA: Diagnosis not present

## 2022-12-07 DIAGNOSIS — Z79890 Hormone replacement therapy (postmenopausal): Secondary | ICD-10-CM

## 2022-12-07 DIAGNOSIS — I2699 Other pulmonary embolism without acute cor pulmonale: Secondary | ICD-10-CM

## 2022-12-07 DIAGNOSIS — I951 Orthostatic hypotension: Secondary | ICD-10-CM | POA: Diagnosis not present

## 2022-12-07 DIAGNOSIS — M069 Rheumatoid arthritis, unspecified: Secondary | ICD-10-CM | POA: Diagnosis not present

## 2022-12-07 DIAGNOSIS — Z888 Allergy status to other drugs, medicaments and biological substances status: Secondary | ICD-10-CM

## 2022-12-07 DIAGNOSIS — R531 Weakness: Secondary | ICD-10-CM | POA: Diagnosis not present

## 2022-12-07 DIAGNOSIS — I82462 Acute embolism and thrombosis of left calf muscular vein: Secondary | ICD-10-CM | POA: Diagnosis not present

## 2022-12-07 DIAGNOSIS — D803 Selective deficiency of immunoglobulin G [IgG] subclasses: Secondary | ICD-10-CM | POA: Diagnosis not present

## 2022-12-07 DIAGNOSIS — M0510 Rheumatoid lung disease with rheumatoid arthritis of unspecified site: Secondary | ICD-10-CM | POA: Diagnosis not present

## 2022-12-07 DIAGNOSIS — E039 Hypothyroidism, unspecified: Secondary | ICD-10-CM | POA: Diagnosis present

## 2022-12-07 DIAGNOSIS — Z66 Do not resuscitate: Secondary | ICD-10-CM | POA: Diagnosis not present

## 2022-12-07 DIAGNOSIS — J441 Chronic obstructive pulmonary disease with (acute) exacerbation: Secondary | ICD-10-CM | POA: Diagnosis present

## 2022-12-07 DIAGNOSIS — J9621 Acute and chronic respiratory failure with hypoxia: Secondary | ICD-10-CM | POA: Diagnosis present

## 2022-12-07 DIAGNOSIS — E876 Hypokalemia: Secondary | ICD-10-CM | POA: Diagnosis present

## 2022-12-07 DIAGNOSIS — R0602 Shortness of breath: Secondary | ICD-10-CM | POA: Diagnosis not present

## 2022-12-07 DIAGNOSIS — R0603 Acute respiratory distress: Secondary | ICD-10-CM | POA: Diagnosis not present

## 2022-12-07 DIAGNOSIS — Z7984 Long term (current) use of oral hypoglycemic drugs: Secondary | ICD-10-CM

## 2022-12-07 DIAGNOSIS — Z79899 Other long term (current) drug therapy: Secondary | ICD-10-CM

## 2022-12-07 DIAGNOSIS — J9601 Acute respiratory failure with hypoxia: Secondary | ICD-10-CM | POA: Diagnosis not present

## 2022-12-07 DIAGNOSIS — D72819 Decreased white blood cell count, unspecified: Secondary | ICD-10-CM

## 2022-12-07 DIAGNOSIS — R000 Tachycardia, unspecified: Secondary | ICD-10-CM | POA: Diagnosis not present

## 2022-12-07 DIAGNOSIS — R7989 Other specified abnormal findings of blood chemistry: Secondary | ICD-10-CM | POA: Diagnosis not present

## 2022-12-07 DIAGNOSIS — Z6821 Body mass index (BMI) 21.0-21.9, adult: Secondary | ICD-10-CM

## 2022-12-07 DIAGNOSIS — R0902 Hypoxemia: Secondary | ICD-10-CM | POA: Diagnosis not present

## 2022-12-07 DIAGNOSIS — Z7982 Long term (current) use of aspirin: Secondary | ICD-10-CM

## 2022-12-07 DIAGNOSIS — J440 Chronic obstructive pulmonary disease with acute lower respiratory infection: Secondary | ICD-10-CM | POA: Diagnosis not present

## 2022-12-07 DIAGNOSIS — J849 Interstitial pulmonary disease, unspecified: Secondary | ICD-10-CM | POA: Diagnosis not present

## 2022-12-07 DIAGNOSIS — R64 Cachexia: Secondary | ICD-10-CM | POA: Diagnosis not present

## 2022-12-07 DIAGNOSIS — Z87891 Personal history of nicotine dependence: Secondary | ICD-10-CM

## 2022-12-07 DIAGNOSIS — Z7401 Bed confinement status: Secondary | ICD-10-CM | POA: Diagnosis not present

## 2022-12-07 DIAGNOSIS — Z8616 Personal history of COVID-19: Secondary | ICD-10-CM | POA: Diagnosis not present

## 2022-12-07 DIAGNOSIS — Z0184 Encounter for antibody response examination: Secondary | ICD-10-CM

## 2022-12-07 DIAGNOSIS — J9622 Acute and chronic respiratory failure with hypercapnia: Secondary | ICD-10-CM | POA: Diagnosis not present

## 2022-12-07 LAB — CBC WITH DIFFERENTIAL/PLATELET
Abs Immature Granulocytes: 0.05 10*3/uL (ref 0.00–0.07)
Basophils Absolute: 0.1 10*3/uL (ref 0.0–0.1)
Basophils Relative: 3 %
Eosinophils Absolute: 0.1 10*3/uL (ref 0.0–0.5)
Eosinophils Relative: 3 %
HCT: 30 % — ABNORMAL LOW (ref 39.0–52.0)
Hemoglobin: 8.8 g/dL — ABNORMAL LOW (ref 13.0–17.0)
Immature Granulocytes: 2 %
Lymphocytes Relative: 10 %
Lymphs Abs: 0.3 10*3/uL — ABNORMAL LOW (ref 0.7–4.0)
MCH: 27.2 pg (ref 26.0–34.0)
MCHC: 29.3 g/dL — ABNORMAL LOW (ref 30.0–36.0)
MCV: 92.6 fL (ref 80.0–100.0)
Monocytes Absolute: 0.2 10*3/uL (ref 0.1–1.0)
Monocytes Relative: 6 %
Neutro Abs: 2.2 10*3/uL (ref 1.7–7.7)
Neutrophils Relative %: 76 %
Platelets: 51 10*3/uL — ABNORMAL LOW (ref 150–400)
RBC: 3.24 MIL/uL — ABNORMAL LOW (ref 4.22–5.81)
RDW: 21.8 % — ABNORMAL HIGH (ref 11.5–15.5)
WBC: 2.8 10*3/uL — ABNORMAL LOW (ref 4.0–10.5)
nRBC: 0.7 % — ABNORMAL HIGH (ref 0.0–0.2)

## 2022-12-07 LAB — BASIC METABOLIC PANEL
Anion gap: 9 (ref 5–15)
BUN: 16 mg/dL (ref 8–23)
CO2: 30 mmol/L (ref 22–32)
Calcium: 7.9 mg/dL — ABNORMAL LOW (ref 8.9–10.3)
Chloride: 98 mmol/L (ref 98–111)
Creatinine, Ser: 1.19 mg/dL (ref 0.61–1.24)
GFR, Estimated: >60 mL/min (ref >60)
Glucose, Bld: 206 mg/dL — ABNORMAL HIGH (ref 70–99)
Potassium: 3.1 mmol/L — ABNORMAL LOW (ref 3.5–5.1)
Sodium: 137 mmol/L (ref 135–145)

## 2022-12-07 LAB — RESP PANEL BY RT-PCR (RSV, FLU A&B, COVID)  RVPGX2
Influenza A by PCR: NEGATIVE
Influenza B by PCR: NEGATIVE
Resp Syncytial Virus by PCR: NEGATIVE
SARS Coronavirus 2 by RT PCR: POSITIVE — AB

## 2022-12-07 NOTE — ED Notes (Signed)
Pt states oxygen dropping. Noted O2 tank empty. Pt provided new oxygen tank. SpO2 increased to WDL. Sister remains with pt

## 2022-12-07 NOTE — ED Provider Triage Note (Signed)
Emergency Medicine Provider Triage Evaluation Note  [name] , a 77 y.o. male  was evaluated in triage.  Pt complains of increased shortness of breath but unable to specify timeframe.  Patient states that he has a history of COPD that requires 4 L home O2 and today when his therapist came to see him at the house they measured his O2 at home to be in the high 70s to low 80s on his typical 4 L.  He states that other than feeling slightly more short of breath lately than his baseline he has had no other symptoms including fever, chills, cough, congestion, chest pain, lower extremity edema or pain, or other symptoms.   Review of Systems  Positive: See HPI Negative: See HPI  Physical Exam  BP (!) 103/58 (BP Location: Right Arm)   Pulse 98   Temp 97.6 F (36.4 C) (Oral)   Resp (!) 23   SpO2 96%  Gen:   Awake, no distress   Resp:  Normal effort lungs clear to auscultation, on 4 L O2 via nasal cannula MSK:   Moves extremities without difficulty, no lower extremity edema Other:  Regular rate and rhythm, alert and oriented, no increased work of breathing  Medical Decision Making  Medically screening exam initiated at 8:58 PM.  Appropriate orders placed.  [name] was informed that the remainder of the evaluation will be completed by another provider, this initial triage assessment does not replace that evaluation, and the importance of remaining in the ED until their evaluation is complete.     Suzzette Righter, PA-C 12/07/22 2059

## 2022-12-07 NOTE — ED Triage Notes (Addendum)
Pt via POV with sister. Endorses feelings of SOB, no CP, with low SpO2 readings. Home SpO2 at 70s/80s on his chronic 4 L oxygen. Pt states chronic dry cough. No fevers/chills. No known sick exposure. Pt 96% on chronic 4 L oxygen.

## 2022-12-08 ENCOUNTER — Inpatient Hospital Stay (HOSPITAL_COMMUNITY): Payer: Medicare Other

## 2022-12-08 ENCOUNTER — Other Ambulatory Visit (HOSPITAL_COMMUNITY): Payer: Self-pay

## 2022-12-08 ENCOUNTER — Other Ambulatory Visit (HOSPITAL_COMMUNITY): Payer: Medicare Other

## 2022-12-08 DIAGNOSIS — R55 Syncope and collapse: Secondary | ICD-10-CM | POA: Diagnosis not present

## 2022-12-08 DIAGNOSIS — E039 Hypothyroidism, unspecified: Secondary | ICD-10-CM | POA: Diagnosis present

## 2022-12-08 DIAGNOSIS — R7989 Other specified abnormal findings of blood chemistry: Secondary | ICD-10-CM | POA: Diagnosis present

## 2022-12-08 DIAGNOSIS — Z7189 Other specified counseling: Secondary | ICD-10-CM | POA: Diagnosis not present

## 2022-12-08 DIAGNOSIS — U071 COVID-19: Principal | ICD-10-CM | POA: Diagnosis present

## 2022-12-08 DIAGNOSIS — Z515 Encounter for palliative care: Secondary | ICD-10-CM | POA: Diagnosis not present

## 2022-12-08 DIAGNOSIS — I110 Hypertensive heart disease with heart failure: Secondary | ICD-10-CM | POA: Diagnosis present

## 2022-12-08 DIAGNOSIS — E876 Hypokalemia: Secondary | ICD-10-CM | POA: Diagnosis present

## 2022-12-08 DIAGNOSIS — D803 Selective deficiency of immunoglobulin G [IgG] subclasses: Secondary | ICD-10-CM | POA: Diagnosis present

## 2022-12-08 DIAGNOSIS — Z794 Long term (current) use of insulin: Secondary | ICD-10-CM | POA: Diagnosis not present

## 2022-12-08 DIAGNOSIS — J9621 Acute and chronic respiratory failure with hypoxia: Secondary | ICD-10-CM | POA: Diagnosis present

## 2022-12-08 DIAGNOSIS — I2699 Other pulmonary embolism without acute cor pulmonale: Secondary | ICD-10-CM | POA: Diagnosis not present

## 2022-12-08 DIAGNOSIS — I509 Heart failure, unspecified: Secondary | ICD-10-CM | POA: Diagnosis present

## 2022-12-08 DIAGNOSIS — Z66 Do not resuscitate: Secondary | ICD-10-CM | POA: Diagnosis present

## 2022-12-08 DIAGNOSIS — I2721 Secondary pulmonary arterial hypertension: Secondary | ICD-10-CM | POA: Diagnosis present

## 2022-12-08 DIAGNOSIS — D61818 Other pancytopenia: Secondary | ICD-10-CM | POA: Diagnosis present

## 2022-12-08 DIAGNOSIS — E785 Hyperlipidemia, unspecified: Secondary | ICD-10-CM | POA: Diagnosis present

## 2022-12-08 DIAGNOSIS — J9601 Acute respiratory failure with hypoxia: Secondary | ICD-10-CM

## 2022-12-08 DIAGNOSIS — I951 Orthostatic hypotension: Secondary | ICD-10-CM | POA: Diagnosis not present

## 2022-12-08 DIAGNOSIS — J8410 Pulmonary fibrosis, unspecified: Secondary | ICD-10-CM | POA: Diagnosis present

## 2022-12-08 DIAGNOSIS — J441 Chronic obstructive pulmonary disease with (acute) exacerbation: Secondary | ICD-10-CM | POA: Diagnosis present

## 2022-12-08 DIAGNOSIS — I2609 Other pulmonary embolism with acute cor pulmonale: Secondary | ICD-10-CM | POA: Diagnosis present

## 2022-12-08 DIAGNOSIS — E119 Type 2 diabetes mellitus without complications: Secondary | ICD-10-CM

## 2022-12-08 DIAGNOSIS — J9622 Acute and chronic respiratory failure with hypercapnia: Secondary | ICD-10-CM | POA: Diagnosis present

## 2022-12-08 DIAGNOSIS — L89152 Pressure ulcer of sacral region, stage 2: Secondary | ICD-10-CM | POA: Diagnosis present

## 2022-12-08 DIAGNOSIS — J1282 Pneumonia due to coronavirus disease 2019: Secondary | ICD-10-CM | POA: Diagnosis present

## 2022-12-08 DIAGNOSIS — M0510 Rheumatoid lung disease with rheumatoid arthritis of unspecified site: Secondary | ICD-10-CM | POA: Diagnosis present

## 2022-12-08 DIAGNOSIS — I272 Pulmonary hypertension, unspecified: Secondary | ICD-10-CM | POA: Diagnosis not present

## 2022-12-08 DIAGNOSIS — I10 Essential (primary) hypertension: Secondary | ICD-10-CM | POA: Diagnosis present

## 2022-12-08 DIAGNOSIS — I82462 Acute embolism and thrombosis of left calf muscular vein: Secondary | ICD-10-CM | POA: Diagnosis present

## 2022-12-08 DIAGNOSIS — J439 Emphysema, unspecified: Secondary | ICD-10-CM | POA: Diagnosis present

## 2022-12-08 DIAGNOSIS — J440 Chronic obstructive pulmonary disease with acute lower respiratory infection: Secondary | ICD-10-CM | POA: Diagnosis present

## 2022-12-08 DIAGNOSIS — R64 Cachexia: Secondary | ICD-10-CM | POA: Diagnosis present

## 2022-12-08 LAB — D-DIMER, QUANTITATIVE: D-Dimer, Quant: 2.84 ug/mL-FEU — ABNORMAL HIGH (ref 0.00–0.50)

## 2022-12-08 LAB — GLUCOSE, CAPILLARY
Glucose-Capillary: 161 mg/dL — ABNORMAL HIGH (ref 70–99)
Glucose-Capillary: 301 mg/dL — ABNORMAL HIGH (ref 70–99)
Glucose-Capillary: 334 mg/dL — ABNORMAL HIGH (ref 70–99)

## 2022-12-08 LAB — MAGNESIUM: Magnesium: 1.5 mg/dL — ABNORMAL LOW (ref 1.7–2.4)

## 2022-12-08 LAB — LACTATE DEHYDROGENASE: LDH: 264 U/L — ABNORMAL HIGH (ref 98–192)

## 2022-12-08 LAB — LACTIC ACID, PLASMA
Lactic Acid, Venous: 3.5 mmol/L (ref 0.5–1.9)
Lactic Acid, Venous: 3 mmol/L (ref 0.5–1.9)

## 2022-12-08 LAB — TROPONIN I (HIGH SENSITIVITY)
Troponin I (High Sensitivity): 221 ng/L (ref <18)
Troponin I (High Sensitivity): 282 ng/L (ref <18)

## 2022-12-08 LAB — PROCALCITONIN: Procalcitonin: 0.49 ng/mL

## 2022-12-08 LAB — BRAIN NATRIURETIC PEPTIDE: B Natriuretic Peptide: 1019.8 pg/mL — ABNORMAL HIGH (ref 0.0–100.0)

## 2022-12-08 LAB — HEPARIN LEVEL (UNFRACTIONATED): Heparin Unfractionated: <0.1 IU/mL — ABNORMAL LOW (ref 0.30–0.70)

## 2022-12-08 LAB — C-REACTIVE PROTEIN: CRP: 11.8 mg/dL — ABNORMAL HIGH (ref <1.0)

## 2022-12-08 LAB — FERRITIN: Ferritin: 408 ng/mL — ABNORMAL HIGH (ref 24–336)

## 2022-12-08 MED ORDER — IPRATROPIUM-ALBUTEROL 0.5-2.5 (3) MG/3ML IN SOLN: 3.0000 mL | Freq: Four times a day (QID) | RESPIRATORY_TRACT | Status: DC

## 2022-12-08 MED ORDER — TRAZODONE HCL 50 MG PO TABS
25.0000 mg | ORAL_TABLET | Freq: Every evening | ORAL | Status: DC | PRN
  Administered 2022-12-12 – 2022-12-14 (×3): 25 mg via ORAL
  Filled 2022-12-08 (×3): qty 1

## 2022-12-08 MED ORDER — MAGNESIUM HYDROXIDE 400 MG/5ML PO SUSP: 30.0000 mL | Freq: Every day | ORAL | Status: DC | PRN

## 2022-12-08 MED ORDER — IOHEXOL 350 MG/ML SOLN
75.0000 mL | Freq: Once | INTRAVENOUS | Status: AC | PRN
  Administered 2022-12-08: 75 mL via INTRAVENOUS

## 2022-12-08 MED ORDER — NIRMATRELVIR/RITONAVIR (PAXLOVID)TABLET
3.0000 | ORAL_TABLET | Freq: Two times a day (BID) | ORAL | Status: DC
  Administered 2022-12-08 – 2022-12-10 (×5): 3 via ORAL
  Filled 2022-12-08: qty 30

## 2022-12-08 MED ORDER — ZINC SULFATE 220 (50 ZN) MG PO CAPS
220.0000 mg | ORAL_CAPSULE | Freq: Every day | ORAL | Status: DC
  Administered 2022-12-08 – 2022-12-23 (×16): 220 mg via ORAL
  Filled 2022-12-08 (×16): qty 1

## 2022-12-08 MED ORDER — ALBUTEROL SULFATE (2.5 MG/3ML) 0.083% IN NEBU
3.0000 mL | INHALATION_SOLUTION | Freq: Once | RESPIRATORY_TRACT | Status: AC
  Administered 2022-12-08: 3 mL via RESPIRATORY_TRACT
  Filled 2022-12-08: qty 3

## 2022-12-08 MED ORDER — POTASSIUM CHLORIDE CRYS ER 20 MEQ PO TBCR
40.0000 meq | EXTENDED_RELEASE_TABLET | Freq: Once | ORAL | Status: AC
  Administered 2022-12-08: 40 meq via ORAL
  Filled 2022-12-08: qty 2

## 2022-12-08 MED ORDER — FAMOTIDINE 20 MG PO TABS
20.0000 mg | ORAL_TABLET | Freq: Two times a day (BID) | ORAL | Status: DC
  Administered 2022-12-08 – 2022-12-23 (×31): 20 mg via ORAL
  Filled 2022-12-08 (×31): qty 1

## 2022-12-08 MED ORDER — LEVOTHYROXINE SODIUM 25 MCG PO TABS
25.0000 ug | ORAL_TABLET | Freq: Every day | ORAL | Status: DC
  Administered 2022-12-09 – 2022-12-23 (×15): 25 ug via ORAL
  Filled 2022-12-08 (×15): qty 1

## 2022-12-08 MED ORDER — ACETAMINOPHEN 500 MG PO TABS
1000.0000 mg | ORAL_TABLET | Freq: Four times a day (QID) | ORAL | Status: DC | PRN
  Administered 2022-12-09 – 2022-12-15 (×2): 1000 mg via ORAL
  Filled 2022-12-08 (×2): qty 2

## 2022-12-08 MED ORDER — HEPARIN BOLUS VIA INFUSION
3500.0000 [IU] | Freq: Once | INTRAVENOUS | Status: AC
  Administered 2022-12-08: 3500 [IU] via INTRAVENOUS
  Filled 2022-12-08: qty 3500

## 2022-12-08 MED ORDER — HEPARIN BOLUS VIA INFUSION
2000.0000 [IU] | Freq: Once | INTRAVENOUS | Status: AC
  Administered 2022-12-08: 2000 [IU] via INTRAVENOUS
  Filled 2022-12-08: qty 2000

## 2022-12-08 MED ORDER — ASPIRIN 81 MG PO CHEW
81.0000 mg | CHEWABLE_TABLET | Freq: Every day | ORAL | Status: DC
  Administered 2022-12-08 – 2022-12-10 (×3): 81 mg via ORAL
  Filled 2022-12-08 (×3): qty 1

## 2022-12-08 MED ORDER — ALFUZOSIN HCL ER 10 MG PO TB24: 10.0000 mg | ORAL_TABLET | Freq: Every day | ORAL | Status: DC

## 2022-12-08 MED ORDER — IPRATROPIUM-ALBUTEROL 0.5-2.5 (3) MG/3ML IN SOLN
3.0000 mL | RESPIRATORY_TRACT | Status: DC | PRN
  Administered 2022-12-11: 3 mL via RESPIRATORY_TRACT
  Filled 2022-12-08: qty 3

## 2022-12-08 MED ORDER — ONDANSETRON HCL 4 MG/2ML IJ SOLN: 4.0000 mg | Freq: Four times a day (QID) | INTRAMUSCULAR | Status: DC | PRN

## 2022-12-08 MED ORDER — ONDANSETRON HCL 4 MG PO TABS
4.0000 mg | ORAL_TABLET | Freq: Four times a day (QID) | ORAL | Status: DC | PRN
  Administered 2022-12-20: 4 mg via ORAL
  Filled 2022-12-08: qty 1

## 2022-12-08 MED ORDER — POTASSIUM CHLORIDE IN NACL 20-0.9 MEQ/L-% IV SOLN
INTRAVENOUS | Status: DC
  Filled 2022-12-08 (×3): qty 1000

## 2022-12-08 MED ORDER — MAGNESIUM SULFATE 2 GM/50ML IV SOLN
2.0000 g | Freq: Once | INTRAVENOUS | Status: AC
  Administered 2022-12-08: 2 g via INTRAVENOUS
  Filled 2022-12-08: qty 50

## 2022-12-08 MED ORDER — IPRATROPIUM-ALBUTEROL 0.5-2.5 (3) MG/3ML IN SOLN
3.0000 mL | Freq: Four times a day (QID) | RESPIRATORY_TRACT | Status: DC
  Administered 2022-12-08: 3 mL via RESPIRATORY_TRACT
  Filled 2022-12-08: qty 3

## 2022-12-08 MED ORDER — VITAMIN C 500 MG PO TABS
500.0000 mg | ORAL_TABLET | Freq: Every day | ORAL | Status: DC
  Administered 2022-12-08 – 2022-12-23 (×16): 500 mg via ORAL
  Filled 2022-12-08 (×16): qty 1

## 2022-12-08 MED ORDER — METHYLPREDNISOLONE SODIUM SUCC 125 MG IJ SOLR
125.0000 mg | Freq: Once | INTRAMUSCULAR | Status: AC
  Administered 2022-12-08: 125 mg via INTRAVENOUS
  Filled 2022-12-08: qty 2

## 2022-12-08 MED ORDER — PANTOPRAZOLE SODIUM 40 MG PO TBEC
40.0000 mg | DELAYED_RELEASE_TABLET | Freq: Every day | ORAL | Status: DC
  Administered 2022-12-08 – 2022-12-23 (×16): 40 mg via ORAL
  Filled 2022-12-08 (×16): qty 1

## 2022-12-08 MED ORDER — AMLODIPINE BESYLATE 10 MG PO TABS
10.0000 mg | ORAL_TABLET | Freq: Every day | ORAL | Status: DC
  Administered 2022-12-08: 10 mg via ORAL
  Filled 2022-12-08 (×2): qty 1

## 2022-12-08 MED ORDER — HEPARIN (PORCINE) 25000 UT/250ML-% IV SOLN
950.0000 [IU]/h | INTRAVENOUS | Status: DC
  Administered 2022-12-08: 850 [IU]/h via INTRAVENOUS
  Administered 2022-12-09: 1000 [IU]/h via INTRAVENOUS
  Administered 2022-12-10 – 2022-12-11 (×2): 950 [IU]/h via INTRAVENOUS
  Filled 2022-12-08 (×4): qty 250

## 2022-12-08 MED ORDER — IPRATROPIUM-ALBUTEROL 0.5-2.5 (3) MG/3ML IN SOLN
3.0000 mL | Freq: Three times a day (TID) | RESPIRATORY_TRACT | Status: DC
  Administered 2022-12-09: 3 mL via RESPIRATORY_TRACT
  Filled 2022-12-08: qty 3

## 2022-12-08 MED ORDER — GUAIFENESIN-DM 100-10 MG/5ML PO SYRP: 10.0000 mL | ORAL_SOLUTION | ORAL | Status: DC | PRN

## 2022-12-08 MED ORDER — INSULIN ASPART 100 UNIT/ML IJ SOLN
0.0000 [IU] | Freq: Every day | INTRAMUSCULAR | Status: DC
  Administered 2022-12-08: 4 [IU] via SUBCUTANEOUS
  Administered 2022-12-12 – 2022-12-14 (×2): 2 [IU] via SUBCUTANEOUS

## 2022-12-08 MED ORDER — SODIUM CHLORIDE 0.9 % IV SOLN
2.0000 g | Freq: Every day | INTRAVENOUS | Status: DC
  Administered 2022-12-08 – 2022-12-10 (×3): 2 g via INTRAVENOUS
  Filled 2022-12-08 (×3): qty 20

## 2022-12-08 MED ORDER — HYDROCOD POLI-CHLORPHE POLI ER 10-8 MG/5ML PO SUER: 5.0000 mL | Freq: Two times a day (BID) | ORAL | Status: DC | PRN

## 2022-12-08 MED ORDER — FLUTICASONE PROPIONATE 50 MCG/ACT NA SUSP
2.0000 | Freq: Every day | NASAL | Status: DC
  Administered 2022-12-10 – 2022-12-23 (×14): 2 via NASAL
  Filled 2022-12-08 (×2): qty 16

## 2022-12-08 MED ORDER — FOLIC ACID 1 MG PO TABS
500.0000 ug | ORAL_TABLET | Freq: Every day | ORAL | Status: DC
  Administered 2022-12-08 – 2022-12-23 (×16): 0.5 mg via ORAL
  Filled 2022-12-08 (×16): qty 1

## 2022-12-08 MED ORDER — TREPROSTINIL 48 MCG IN POWD: 48.0000 ug | Freq: Three times a day (TID) | RESPIRATORY_TRACT | Status: DC

## 2022-12-08 MED ORDER — PREDNISONE 50 MG PO TABS: 50.0000 mg | ORAL_TABLET | Freq: Every day | ORAL | Status: DC

## 2022-12-08 MED ORDER — FINASTERIDE 5 MG PO TABS
5.0000 mg | ORAL_TABLET | Freq: Every day | ORAL | Status: DC
  Administered 2022-12-08 – 2022-12-23 (×16): 5 mg via ORAL
  Filled 2022-12-08 (×16): qty 1

## 2022-12-08 MED ORDER — IPRATROPIUM-ALBUTEROL 0.5-2.5 (3) MG/3ML IN SOLN
3.0000 mL | Freq: Four times a day (QID) | RESPIRATORY_TRACT | Status: DC
  Administered 2022-12-08 (×2): 3 mL via RESPIRATORY_TRACT
  Filled 2022-12-08 (×2): qty 3

## 2022-12-08 MED ORDER — ATORVASTATIN CALCIUM 10 MG PO TABS
20.0000 mg | ORAL_TABLET | Freq: Every day | ORAL | Status: DC
  Administered 2022-12-08: 20 mg via ORAL
  Filled 2022-12-08 (×2): qty 2

## 2022-12-08 MED ORDER — METHYLPREDNISOLONE SODIUM SUCC 125 MG IJ SOLR
1.0000 mg/kg | Freq: Two times a day (BID) | INTRAMUSCULAR | Status: DC
  Administered 2022-12-08 (×2): 58.75 mg via INTRAVENOUS
  Filled 2022-12-08 (×2): qty 2

## 2022-12-08 MED ORDER — TREPROSTINIL 48 MCG IN POWD
48.0000 ug | RESPIRATORY_TRACT | Status: DC
  Administered 2022-12-08 – 2022-12-23 (×57): 48 ug via RESPIRATORY_TRACT

## 2022-12-08 MED ORDER — POTASSIUM CHLORIDE 10 MEQ/100ML IV SOLN
10.0000 meq | Freq: Once | INTRAVENOUS | Status: AC
  Administered 2022-12-08: 10 meq via INTRAVENOUS
  Filled 2022-12-08: qty 100

## 2022-12-08 MED ORDER — INSULIN ASPART 100 UNIT/ML IJ SOLN
0.0000 [IU] | Freq: Three times a day (TID) | INTRAMUSCULAR | Status: DC
  Administered 2022-12-08: 7 [IU] via SUBCUTANEOUS
  Administered 2022-12-08 – 2022-12-09 (×2): 2 [IU] via SUBCUTANEOUS

## 2022-12-08 NOTE — Assessment & Plan Note (Signed)
-   This is Secondary to COPD acute exacerbation. - He will be to admitted to a progressive unit bed. - We will taper BiPAP as tolerated. - Management otherwise as below.

## 2022-12-08 NOTE — Progress Notes (Signed)
ANTICOAGULATION CONSULT NOTE - Follow Up Consult  Pharmacy Consult for heparin Indication: pulmonary embolus  Labs: Recent Labs    12/07/22 2103 12/08/22 0422 12/08/22 0658 12/08/22 1521  HGB 8.8*  --   --   --   HCT 30.0*  --   --   --   PLT 51*  --   --   --   HEPARINUNFRC  --   --   --  <0.10*  CREATININE 1.19  --   --   --   TROPONINIHS  --  221* 282*  --     Assessment: 76yo male subtherapeutic on heparin with initial dosing for PE (CT confirmed today, shows evidence of RHS); no signs of bleeding per RN but she notes that IV is in [facility] and is frequently occluded.  Goal of Therapy:  Heparin level 0.3-0.7 units/ml   Plan:  RN will change heparin infusion to PIV in forearm to prevent occlusion. Will rebolus with heparin 2000 units and increase heparin infusion by 3 units/kg/hr to 1000 units/hr and check level in 8 hours.    Wynona Neat, PharmD, BCPS  12/08/2022,5:12 PM

## 2022-12-08 NOTE — Assessment & Plan Note (Addendum)
Supplement and monitor 

## 2022-12-08 NOTE — Progress Notes (Addendum)
Patient seen and examined.  Admitted early morning hours by nighttime hospitalist.  See history physical.  Additional treatments ordered. In brief, 77 year old gentleman with history of COPD, pulmonary hypertension on 5 L oxygen at home was not feeling well for the last 3 days.  He had gradual worsening shortness of breath to the extent of respiratory distress yesterday so present to the ER.  On 5 L at baseline.  Found to be in she will respiratory status in the ER and treated with BiPAP overnight.  On my exam, he was comfortable on BiPAP, titrated him off to nasal cannula oxygen and is requiring 5 L of oxygen now.  COVID-19 test was positive.  Chest x-ray without evidence of pneumonia.  Acute on chronic hypoxemic respiratory failure, acute respiratory distress secondary to COPD exacerbation due to COVID-19:  Agree with admission to monitored unit because of severity of symptoms. Aggressive bronchodilator therapy, IV steroids, inhalational steroids, scheduled and as needed bronchodilators, deep breathing exercises, incentive spirometry, chest physiotherapy and respiratory therapy consult. Antibiotics due to severity of symptoms.  Will continue Rocephin today. Started on Paxlovid, complete 5 days of therapy. Supplemental oxygen to keep saturations more than 90%. Keep BiPAP as needed for respiratory distress. Daily inflammatory markers, liver function test, electrolytes monitoring. D-dimer was more than 2.  Started on heparin pending CT angiogram.  If positive, will change to Lovenox. Mildly elevated troponins but flat.  Likely demand ischemia.  Resume long-term home medications including thyroxine, insulin coverage for diabetes, antihypertensives, patient is also on treprostinil for his pulmonary arterial hypertension that will be started.   Total time spent: 35 minutes.  Same-day admit.  No charge visit.  Addendum, 4 PM. Patient remains on fluctuating oxygen demand.  Work of breathing has  stabilized.  Now on 15 L high flow nasal cannula. CT angiogram was reported as bilateral PE with right ventricular strain.  This qualifies as submassive pulmonary embolism. Pulmonary critical care notified of submassive pulmonary embolism that needs evaluation.  Will order echocardiogram.  Already therapeutic on heparin since last night.

## 2022-12-08 NOTE — Assessment & Plan Note (Addendum)
Tested positive in Nov 06, 2022.  Given he is on Rituxan, have to consider ongoing COVID infection. - Start remdesivir - Airborne - Send COVID IGg Abs

## 2022-12-08 NOTE — Consult Note (Signed)
NAME:  [name], MRN:  [id number], DOB:  [birth year], LOS: 0 ADMISSION DATE:  12/07/2022, CONSULTATION DATE:  2/6  REFERRING MD:  Dr. Sloan Leiter, CHIEF COMPLAINT:  acute respiratory failure w/ hypoxia   History of Present Illness:  Patient is a 77 year old male with pertinent PMH of COPD on 4L [place] (seen by Dr. Chase Caller), [place] on Tyvaso, RA, T2DM, HTN, hypothyroidism presents to [facility] ED on 2/6 with respiratory distress.  Patient came to [facility] ED on 2/6 for acute onset respiratory distress with congested cough.  Does have wheezing.  Sats on room air low 80s-90s.  On 4 LNC at home.  Required BiPAP for WOB. BP stable. CXR w/ chronic residual lung disease. Covid positive. D-dimer elevated. CTA chest showing PE b/l inferior basilar posterior segmental PE, RV strain; RV/LV 1.8 consistent w/ submassive. PCCM consulted.  Pertinent  Medical History   Past Medical History:  Diagnosis Date   Arthritis    COPD (chronic obstructive pulmonary disease) ([place])    Diabetes mellitus    Hypertension    Pneumonia    Thyroid disease      Significant Hospital Events: Including procedures, antibiotic start and stop dates in addition to other pertinent events   2/6 admitted to [facility] w/ hypoixc resp failure; covid positive, CTA showing PE b/l inferior basilar posterior segmental PE, RV strain; RV/LV 1.8 consistent w/ submassive  Interim History / Subjective:  Patient on phone No complaints Denies any chest pain and wob feels better On 4L [place] sats 96%  Objective   Blood pressure (!) 104/53, pulse 81, temperature 97.7 F (36.5 C), temperature source Oral, resp. rate (!) 24, weight 58.5 kg, SpO2 100 %.    FiO2 (%):  [60 %] 60 %  No intake or output data in the 24 hours ending 12/08/22 1606 Filed Weights   12/08/22 0400  Weight: 58.5 kg    Examination: General:  NAD HEENT: MM pink/moist; [place] in place Neuro: Aox3; MAE CV: s1s2, RRR, no m/r/g PULM:  dim BS bilaterally; [place] 4L GI: soft, bsx4 active   Extremities: warm/dry, no edema  Skin: no rashes or lesions    Resolved Hospital Problem list     Assessment & Plan:  Segmental PE: CTA showing PE b/l inferior basilar posterior segmental PE, RV strain; RV/LV 1.8 consistent w/ submassive Plan: -telemetry monitoring -check echo -check dvt US -heparin gtt -check bnp -trend trop/la  Acute on chronic respiratory failure w/ hypoxemia COPD: on incruse ellipta; seen by Dr. Chase Caller outpt Covid-19 RA: on tocilizumab, azathioprine, and prednisone Hx of PAH: On tyvaso Plan: -wean [place] fio2 for sats >92%; on 4L [place] at home -agree w/ primary continuing paxlovid -cont duoneb and steroids -continue abx for possible cap per primary -cont home RA meds tocilizumab and azathioprine -continue home tyvaso  T2DM Hypokalemia Hypomagnesemia HTN HLD Plan: -per primary   Best Practice (right click and "Reselect all SmartList Selections" daily)   Per primary  Labs   CBC: Recent Labs  Lab 12/07/22 2103  WBC 2.8*  NEUTROABS 2.2  HGB 8.8*  HCT 30.0*  MCV 92.6  PLT 51*    Basic Metabolic Panel: Recent Labs  Lab 12/07/22 2103 12/08/22 0335  NA 137  --   K 3.1*  --   CL 98  --   CO2 30  --   GLUCOSE 206*  --   BUN 16  --   CREATININE 1.19  --   CALCIUM 7.9*  --   MG  --  1.5*   GFR: Estimated Creatinine Clearance: 43 mL/min (by C-G formula based on SCr of 1.19 mg/dL). Recent Labs  Lab 12/07/22 2103 12/08/22 0422  PROCALCITON  --  0.49  WBC 2.8*  --     Liver Function Tests: No results for input(s): "AST", "ALT", "ALKPHOS", "BILITOT", "PROT", "ALBUMIN" in the last 168 hours. No results for input(s): "LIPASE", "AMYLASE" in the last 168 hours. No results for input(s): "AMMONIA" in the last 168 hours.  ABG    Component Value Date/Time   PHART 7.306 (L) 11/14/2020 1436   PCO2ART 47.6 11/14/2020 1436   PO2ART 77.3 (L) 11/14/2020 1436   HCO3 31.3 (H) 11/18/2020 1340   TCO2 33 (H) 11/18/2020 1340   ACIDBASEDEF  2.3 (H) 11/14/2020 1436   O2SAT 74.0 11/18/2020 1340     Coagulation Profile: No results for input(s): "INR", "PROTIME" in the last 168 hours.  Cardiac Enzymes: No results for input(s): "CKTOTAL", "CKMB", "CKMBINDEX", "TROPONINI" in the last 168 hours.  HbA1C: Hgb A1c MFr Bld  Date/Time Value Ref Range Status  11/14/2020 04:21 PM 7.1 (H) 4.8 - 5.6 % Final    Comment:    (NOTE) Pre diabetes:          5.7%-6.4%  Diabetes:              >6.4%  Glycemic control for   <7.0% adults with diabetes   12/15/2011 06:55 PM 7.8 (H) <5.7 % Final    Comment:    (NOTE)                                                                       According to the ADA Clinical Practice Recommendations for 2011, when HbA1c is used as a screening test:  >=6.5%   Diagnostic of Diabetes Mellitus           (if abnormal result is confirmed) 5.7-6.4%   Increased risk of developing Diabetes Mellitus References:Diagnosis and Classification of Diabetes Mellitus,Diabetes WCBJ,6283,15(VVOHY 1):S62-S69 and Standards of Medical Care in         Diabetes - 2011,Diabetes WVPX,1062,69 (Suppl 1):S11-S61.    CBG: Recent Labs  Lab 12/08/22 1244  GLUCAP 161*    Review of Systems:   Review of Systems  Constitutional:  Negative for fever.  Respiratory:  Positive for cough and wheezing. Negative for sputum production and shortness of breath.   Cardiovascular:  Negative for chest pain and leg swelling.  Gastrointestinal:  Negative for abdominal pain, diarrhea, nausea and vomiting.     Past Medical History:  He,  has a past medical history of Arthritis, COPD (chronic obstructive pulmonary disease) ([place]), Diabetes mellitus, Hypertension, Pneumonia, and Thyroid disease.   Surgical History:   Past Surgical History:  Procedure Laterality Date   KNEE ARTHROSCOPY     NO PAST SURGERIES     RIGHT HEART CATH N/A 11/18/2020   Procedure: RIGHT HEART CATH;  Surgeon: Jolaine Artist, MD;  Location: [place] [facility];  Service: Cardiovascular;  Laterality: N/A;   RIGHT/LEFT HEART CATH AND CORONARY ANGIOGRAPHY N/A 05/21/2020   Procedure: RIGHT/LEFT HEART CATH AND CORONARY ANGIOGRAPHY;  Surgeon: Martinique, Peter M, MD;  Location: [place] [facility];  Service: Cardiovascular;  Laterality: N/A;  Social History:   reports that he quit smoking about 38 years ago. His smoking use included cigarettes. He has a 5.00 pack-year smoking history. He has never used smokeless tobacco. He reports that he does not drink alcohol and does not use drugs.   Family History:  His family history includes Diabetes in his mother; Stroke in his mother.   Allergies Allergies  Allergen Reactions   Bactrim [Sulfamethoxazole-Trimethoprim] Shortness Of Breath    Patient states "got short winded"   Spiriva Respimat [Tiotropium Bromide Monohydrate] Rash   Ofev [Nintedanib] Other (See Comments)    Unknown      Home Medications  Prior to Admission medications   Medication Sig Start Date End Date Taking? Authorizing Provider  acetaminophen (TYLENOL) 500 MG tablet Take 1,000 mg by mouth 2 (two) times daily as needed for moderate pain or headache.   Yes [provider]  albuterol (VENTOLIN HFA) 108 (90 Base) MCG/ACT inhaler Inhale 4-5 puffs into the lungs as needed for wheezing or shortness of breath. 11/22/17  Yes [provider]  amLODipine (NORVASC) 10 MG tablet Take 10 mg by mouth daily.   Yes [provider]  aspirin 81 MG tablet Take 81 mg by mouth daily.   Yes [provider]  atorvastatin (LIPITOR) 20 MG tablet Take 20 mg by mouth daily. 08/19/18  Yes [provider]  azaTHIOprine (IMURAN) 50 MG tablet Take 50 mg by mouth daily. 04/13/19  Yes [provider]  finasteride (PROSCAR) 5 MG tablet Take 1 tablet (5 mg total) by mouth daily. 10/20/22  Yes McKenzie, Mardene Celeste, MD  folic acid (FOLVITE) 400 MCG tablet Take 400 mcg by mouth daily.   Yes [provider]   insulin NPH Human (NOVOLIN N) 100 UNIT/ML injection Inject 2-3 Units into the skin daily as needed (for high blood sugar).   Yes [provider]  ipratropium-albuterol (DUONEB) 0.5-2.5 (3) MG/3ML SOLN Inhale 3 mLs into the lungs as needed (wheezing/SOB). 11/13/22  Yes [provider]  levothyroxine (SYNTHROID, LEVOTHROID) 25 MCG tablet Take 25 mcg by mouth daily.   Yes [provider]  metFORMIN (GLUCOPHAGE-XR) 500 MG 24 hr tablet Take 2 tablets (1,000 mg total) by mouth 2 (two) times daily. Patient taking differently: Take 500 mg by mouth 2 (two) times daily. 05/24/20  Yes [place], Peter M, MD  pantoprazole (PROTONIX) 40 MG tablet Take 40 mg by mouth daily. 01/14/20  Yes [provider]  predniSONE (DELTASONE) 1 MG tablet Take 3 mg by mouth daily. 02/12/21  Yes [provider]  Treprostinil (TYVASO DPI MAINTENANCE KIT) 48 MCG POWD Inhale 48 mcg into the lungs in the morning, at noon, in the evening, and at bedtime. 11/17/22  Yes Kalman Shan, MD  alfuzosin (UROXATRAL) 10 MG 24 hr tablet Take 1 tablet (10 mg total) by mouth daily with breakfast. Patient not taking: Reported on 12/08/2022 07/02/22   Bjorn Pippin, MD  fluticasone [facility]) 50 MCG/ACT nasal spray Place 2 sprays into both nostrils daily. Patient not taking: Reported on 12/08/2022 11/08/20   Kalman Shan, MD  Tocilizumab (ACTEMRA ACTPEN) 162 MG/0.9ML SOAJ Inject 162 mg into the skin every 14 (fourteen) days. Courier to pulm: [address], [place] [address]. Appt on 11/16/2022 Patient not taking: Reported on 12/08/2022 11/06/22   Kalman Shan, MD  umeclidinium bromide (INCRUSE ELLIPTA) 62.5 MCG/ACT AEPB Inhale 1 puff into the lungs daily. Patient not taking: Reported on 12/08/2022 02/24/22   Kalman Shan, MD  JD Rexene Agent [place] [facility] 12/08/2022, 4:06 PM  Please see [email or web address] for pager details.  From 7A-7P if no response, please call  [phone]. After hours, please call ELink [phone].

## 2022-12-08 NOTE — ED Provider Notes (Signed)
[facility] Provider Note   CSN: [id number] Arrival date & time: 12/07/22  1937     History  Chief Complaint  Patient presents with   Shortness of Breath    [name] is a 77 y.o. male.  The history is provided by the patient.  Shortness of Breath He has history of hypertension, diabetes, COPD and comes in because of difficulty breathing today.  He was fine yesterday.  He did not use his home nebulizer because he was having difficulty breathing.  He denies fever or chills.  He denies arthralgias or myalgias.  He denies any sick contacts.  Also, oxygen levels at home had fallen today.  He normally runs oxygen saturations from 88% to 94% on 4 L of oxygen at home.  Today, oxygen saturation was dropping down into the 70s.   Home Medications Prior to Admission medications   Medication Sig Start Date End Date Taking? Authorizing Provider  acetaminophen (TYLENOL) 500 MG tablet Take 1,000 mg by mouth every 6 (six) hours as needed for moderate pain or headache.    [provider]  albuterol (VENTOLIN HFA) 108 (90 Base) MCG/ACT inhaler Inhale 2 puffs into the lungs every 6 (six) hours as needed. 11/22/17   [provider]  alfuzosin (UROXATRAL) 10 MG 24 hr tablet Take 1 tablet (10 mg total) by mouth daily with breakfast. 07/02/22   Irine Seal, MD  amLODipine (NORVASC) 10 MG tablet Take 10 mg by mouth daily.    [provider]  aspirin 81 MG tablet Take 81 mg by mouth daily.    [provider]  atorvastatin (LIPITOR) 20 MG tablet Take 20 mg by mouth daily. 08/19/18   [provider]  azaTHIOprine (IMURAN) 50 MG tablet Take 100 mg by mouth daily.  04/13/19   [provider]  ciprofloxacin (CIPRO) 500 MG tablet Take 1 tablet (500 mg total) by mouth every 12 (twelve) hours. 10/01/22   Stoneking, Reece Leader., MD  finasteride (PROSCAR) 5 MG tablet Take 1 tablet (5 mg total) by mouth daily. 10/20/22    McKenzie, Candee Furbish, MD  fluticasone (FLONASE) 50 MCG/ACT nasal spray Place 2 sprays into both nostrils daily. 11/08/20   Brand Males, MD  folic acid (FOLVITE) 502 MCG tablet Take 400 mcg by mouth daily.    [provider]  HYDROcodone bit-homatropine (HYCODAN) 5-1.5 MG/5ML syrup Take 5 mLs by mouth 2 (two) times daily as needed for cough. 12/15/21   Brand Males, MD  insulin NPH Human (NOVOLIN N) 100 UNIT/ML injection Inject 12 Units into the skin See admin instructions. Inject 10 units every morning and 20 units every evening    [provider]  Insulin Syringe-Needle U-100 (INSULIN SYRINGE .3CC/31GX5/16") 31G X 5/16" 0.3 ML MISC Inject 1 Syringe into the skin as directed. 02/27/20   [provider]  levothyroxine (SYNTHROID, LEVOTHROID) 25 MCG tablet Take 25 mcg by mouth daily.    [provider]  metFORMIN (GLUCOPHAGE-XR) 500 MG 24 hr tablet Take 2 tablets (1,000 mg total) by mouth 2 (two) times daily. 05/24/20   Martinique, Peter M, MD  pantoprazole (PROTONIX) 40 MG tablet Take 40 mg by mouth daily. 01/14/20   [provider]  predniSONE (DELTASONE) 1 MG tablet Take 3 mg by mouth daily. 02/12/21   [provider]  Tocilizumab (ACTEMRA ACTPEN) 162 MG/0.9ML SOAJ Inject 162 mg into the skin every 14 (fourteen) days. Courier to pulm: [place], [address], [place] [address]. Appt on 11/16/2022 11/06/22   Brand Males, MD  Treprostinil (TYVASO DPI MAINTENANCE KIT) 48 MCG POWD Inhale 48 mcg into the lungs in the morning, at noon, in the evening, and at bedtime. 11/17/22   Brand Males, MD  umeclidinium bromide (INCRUSE ELLIPTA) 62.5 MCG/ACT AEPB Inhale 1 puff into the lungs daily. 02/24/22   Brand Males, MD      Allergies    Bactrim [sulfamethoxazole-trimethoprim], Spiriva respimat [tiotropium bromide monohydrate], and Ofev [nintedanib]    Review of Systems   Review of Systems  Respiratory:  Positive for shortness of breath.    All other systems reviewed and are negative.   Physical Exam Updated Vital Signs BP (!) 128/94   Pulse (!) 129   Temp 97.6 F (36.4 C) (Oral)   Resp (!) 36   SpO2 (!) 74%  Physical Exam Vitals and nursing note reviewed.   77 year old male in moderate respiratory distress.  [name] using accessory muscles of respiration, but he is able to speak in complete sentences.  Vital signs are significant for elevated heart rate, respiratory rate, borderline elevated blood pressure. Oxygen saturation is 74%, which is hypoxic. Head is normocephalic and atraumatic. PERRLA, EOMI. Oropharynx is clear. Neck is nontender and supple without adenopathy or JVD. Back is nontender and there is no CVA tenderness. Lungs have moderate airflow with diffuse expiratory wheezes.  No rales or rhonchi appreciated. Chest is nontender. Heart is tachycardic without murmur. Abdomen is soft, flat, nontender. Extremities have no cyanosis or edema, full range of motion is present. Skin is warm and dry without rash. Neurologic: Mental status is normal, cranial nerves are intact, moves all extremities equally.  ED Results / Procedures / Treatments   Labs (all labs ordered are listed, but only abnormal results are displayed) Labs Reviewed  RESP PANEL BY RT-PCR (RSV, FLU A&B, COVID)  RVPGX2 - Abnormal; Notable for the following components:      Result Value   SARS Coronavirus 2 by RT PCR POSITIVE (*)    All other components within normal limits  BASIC METABOLIC PANEL - Abnormal; Notable for the following components:   Potassium 3.1 (*)    Glucose, Bld 206 (*)    Calcium 7.9 (*)    All other components within normal limits  CBC WITH DIFFERENTIAL/PLATELET - Abnormal; Notable for the following components:   WBC 2.8 (*)    RBC 3.24 (*)    Hemoglobin 8.8 (*)    HCT 30.0 (*)    MCHC 29.3 (*)    RDW 21.8 (*)    Platelets 51 (*)    nRBC 0.7 (*)    Lymphs Abs 0.3 (*)    All other components within normal limits   MAGNESIUM    EKG EKG Interpretation  Date/Time:  Monday December 07 2022 21:09:35 EST Ventricular Rate:  90 PR Interval:    QRS Duration: 102 QT Interval:  402 QTC Calculation: 491 R Axis:   153 Text Interpretation: Sinus rhythm Premature atrial complexes Right ventricular hypertrophy T wave abnormality, consider anterior ischemia Prolonged QT Abnormal ECG When compared with ECG of 13-Jul-2022 14:01, Premature atrial complexes are now present Confirmed by Delora Fuel ([id number]) on 12/08/2022 3:31:20 AM  Radiology DG Chest 2 View  Result Date: 12/07/2022 CLINICAL DATA:  Increased short of breath EXAM: CHEST - 2 VIEW COMPARISON:  05/13/2022, CT 06/10/2022 FINDINGS: Chronic reticular interstitial opacity corresponding to fibrosis and chronic lung disease. No acute airspace disease or pleural effusion.  Stable cardiomediastinal silhouette. No pneumothorax. IMPRESSION: No active cardiopulmonary disease. Chronic interstitial lung disease. Electronically Signed   By: Jasmine Pang [name]   On: 12/07/2022 21:51    Procedures Procedures  Cardiac monitor shows sinus tachycardia, per my interpretation.  Medications Ordered in ED Medications  albuterol (PROVENTIL) (2.5 MG/3ML) 0.083% nebulizer solution 3 mL (has no administration in time range)  magnesium sulfate IVPB 2 g 50 mL (2 g Intravenous New Bag/Given 12/08/22 0313)  potassium chloride 10 mEq in 100 mL IVPB (10 mEq Intravenous New Bag/Given 12/08/22 0315)  methylPREDNISolone sodium succinate (SOLU-MEDROL) 125 mg/2 mL injection 125 mg (125 mg Intravenous Given 12/08/22 0309)  potassium chloride SA (KLOR-CON M) CR tablet 40 mEq (40 mEq Oral Given 12/08/22 0308)    ED Course/ Medical Decision Making/ A&P                             Medical Decision Making Amount and/or Complexity of Data Reviewed Labs: ordered.  Risk Prescription drug management.   COPD exacerbation with hypoxia.  He was initially triaged to go into the waiting room, but was  in respiratory distress when brought back to the room.  He has DNR paperwork with him and I did reconfirm with him that he does not wish to be resuscitated and does not wish to be intubated.  I have ordered BiPAP for respiratory support.  I have ordered nebulizer treatment with albuterol (ipratropium not ordered because of allergy), intravenous magnesium, intravenous methylprednisolone.  Differential diagnosis includes COPD exacerbation, sepsis pneumonia, respiratory infection such as COVID-19 or influenza or RSV.  Chest x-ray shows chronic interstitial lung disease but no evidence of pneumonia.  I have independently viewed the images, and agree with the radiologist's interpretation.  I have reviewed and interpreted his laboratory tests and my interpretation is mild to moderate hypokalemia, anemia which is slightly worse than on 07/19/2022, leukopenia which is new.  Moderate thrombocytopenia which is new and may be related to the COVID-19 infection respiratory pathogen panel has positive PCR for COVID-19 which is apparently what has triggered this exacerbation.  I have ordered a blood magnesium level.  I have reviewed and interpreted his electrocardiogram and my interpretation is sinus rhythm with frequent PACs versus multifocal atrial tachycardia T wave abnormality unchanged from prior, borderline prolonged QT interval which is already being treated with intravenous magnesium.  I have discussed the case with Dr. Arville Care of [facility], who agrees to admit the patient.  I have explained to the patient and his family that this is a potentially life-threatening condition.  CRITICAL CARE Performed by: Dione Booze Total critical care time: 40 minutes Critical care time was exclusive of separately billable procedures and treating other patients. Critical care was necessary to treat or prevent imminent or life-threatening deterioration. Critical care was time spent personally by me on the following activities:  development of treatment plan with patient and/or surrogate as well as nursing, discussions with consultants, evaluation of patient's response to treatment, examination of patient, obtaining history from patient or surrogate, ordering and performing treatments and interventions, ordering and review of laboratory studies, ordering and review of radiographic studies, pulse oximetry and re-evaluation of patient's condition.  Final Clinical Impression(s) / ED Diagnoses Final diagnoses:  COVID-19 virus infection  COPD exacerbation (HCC)  Acute and chronic respiratory failure with hypoxia (HCC)  Hypokalemia  Leukopenia, unspecified type  Thrombocytopenia (HCC)    Rx / DC Orders ED Discharge Orders  None         Delora Fuel, MD 08/28/24 [phone]

## 2022-12-08 NOTE — Assessment & Plan Note (Addendum)
-   Continue steroids. - Continue bronchodilators.

## 2022-12-08 NOTE — TOC Initial Note (Signed)
Transition of Care [facility]) - Initial/Assessment Note    Patient Details  Name: [name] MRN: [id number] Date of Birth: October 01, 1946  Transition of Care [facility]) CM/SW Contact:    Zenon Mayo, RN Phone Number: 12/08/2022, 4:00 PM  Clinical Narrative:                 From home with wife, presents with copd, covid, acute resp failure, elevated trops.  He states he was working with physical therapy at home became sob, now admitted,  he was not sure of agency name but states he wants to stay with them.  NCM contacted his PCP office to find out, they said looks like it is [place].  NCM confirmed with Claiborne Billings with [place], she thinks it is their [place] office, she will get back with this NCM.  Patient has home oxygen 5 liters with [employer]. TOC following.  Expected Discharge Plan: [place] Barriers to Discharge: Continued Medical Work up   Patient Goals and CMS Choice Patient states their goals for this hospitalization and ongoing recovery are:: return home CMS Medicare.gov Compare Post Acute Care list provided to:: Patient Choice offered to / list presented to : Patient      Expected Discharge Plan and Services In-house Referral: NA Discharge Planning Services: CM Consult Post Acute Care Choice: Home Health Living arrangements for the past 2 months: Single Family Home                 DME Arranged: N/A DME Agency: NA       HH Arranged: PT HH Agency: [place] [facility] Date HH Agency Contacted: 12/08/22 Time HH Agency Contacted: 51 Representative spoke with at [facility]: Claiborne Billings  Prior Living Arrangements/Services Living arrangements for the past 2 months: [place] with:: Spouse Patient language and need for interpreter reviewed:: Yes Do you feel safe going back to the place where you live?: Yes      Need for Family Participation in Patient Care: Yes (Comment) Care giver support system in place?: Yes (comment) Current home  services: DME (walker, cane, home oxygen with [employer] 5 liters) Criminal Activity/Legal Involvement Pertinent to Current Situation/Hospitalization: No - Comment as needed  Activities of Daily Living      Permission Sought/Granted                  Emotional Assessment   Attitude/Demeanor/Rapport: Engaged Affect (typically observed): Appropriate Orientation: : Oriented to Self, Oriented to Place, Oriented to  Time, Oriented to Situation Alcohol / Substance Use: Not Applicable Psych Involvement: No (comment)  Admission diagnosis:  Hypokalemia [E87.6] Thrombocytopenia (HCC) [D69.6] COPD exacerbation ([place]) [J44.1] Acute respiratory failure with hypoxia ([place]) [J96.01] Acute and chronic respiratory failure with hypoxia (HCC) [J96.21] Leukopenia, unspecified type [D72.819] COVID-19 virus infection [U07.1] Patient Active Problem List   Diagnosis Date Noted   Acute respiratory failure with hypoxia ([place]) 12/08/2022   COPD with acute exacerbation ([place]) 12/08/2022   COVID-19 virus infection 12/08/2022   Dyslipidemia 12/08/2022   Essential hypertension 12/08/2022   Hypomagnesemia 12/08/2022   Hypokalemia 12/08/2022   Elevated troponin I level 12/08/2022   Type 2 diabetes mellitus ([place]) 12/08/2022   Weight loss 05/13/2022   Dental caries 05/13/2022   Respiratory distress 11/14/2020   Acute on chronic respiratory failure ([place]) 02/17/2020   Aortic atherosclerosis ([place]) 08/01/2019   Shortness of breath 08/01/2019   Therapeutic drug monitoring 05/30/2019   Chronic respiratory failure with hypoxia ([place]) 05/30/2019   Nocturnal hypoxemia 04/26/2019  Medication management 04/26/2019   Drug reaction 10/18/2018   Vomiting without nausea 10/18/2018   Wheezing on auscultation 01/01/2017   Nodule of right lung 07/29/2015   Coronary artery calcification 02/11/2014   [place] 01/18/2014   Chronic cough 07/01/2013   Obesity (BMI 30.0-34.9) 07/26/2012   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  RA (RA-ILD) 12/15/2011   Pneumonia 12/15/2011   HTN (hypertension) 12/15/2011   Rheumatoid arthritis ([place]) 12/15/2011   Hypothyroidism 12/15/2011   PCP:  Merrilee Seashore, MD Pharmacy:   [facility] - [address], [place] [address] Phone: [phone] Fax: [phone]  [place] [address], [place] [address] [place]  [address] Phone: [phone] Fax: [phone]  [place] [address] Phone: [phone] Fax: [phone]  [place], [place] [address] [place] [address] Phone: [phone] Fax: [phone]     Social Determinants of Health (SDOH) Social History: SDOH Screenings   Food Insecurity: No Food Insecurity (06/01/2019)  Housing: Low Risk  (06/01/2019)  Depression (PHQ2-9): Low Risk  (06/01/2019)  Financial Resource Strain: Low Risk  (06/01/2019)  Physical Activity: Inactive (06/01/2019)  Stress: No Stress Concern Present (06/01/2019)  Tobacco Use: Medium Risk (10/23/2022)   SDOH Interventions:     Readmission Risk Interventions    12/08/2022    3:54 PM  Readmission Risk Prevention Plan  Transportation Screening Complete  PCP or Specialist Appt within 3-5 Days Complete  HRI or [place] Complete  Social Work Consult for [place] Planning/Counseling Complete  Palliative Care Screening Not Applicable  Medication Review Press photographer) Complete

## 2022-12-08 NOTE — Assessment & Plan Note (Signed)
Likely myocardial injury due to COVID, ischemia ruled out

## 2022-12-08 NOTE — Progress Notes (Signed)
Pt found off BIPAP on 2l [place] with a sat of 82. Pt stated he wore 5l at baseline. RT increased [place] to 5l and pt's sats immediately improved along with WOB. RT will monitor.

## 2022-12-08 NOTE — ED Notes (Signed)
Debra: Sister [phone]

## 2022-12-08 NOTE — Assessment & Plan Note (Addendum)
Presented with hypoxia requiring BiPAP and nonrebreather.  Discussed with Pulmonology, Dr. Marchelle Gearing, likely from PE in setting of severe ILD and pHTN.  With his Rituxan, Pulm think we have to also consider inability to mount response, and possibly ongoing COVID .   Currently requiring 5L O2 by nasal cannula.

## 2022-12-08 NOTE — Assessment & Plan Note (Signed)
-   We will continue statin therapy. 

## 2022-12-08 NOTE — H&P (Addendum)
[place]   PATIENT NAME: [name]    MR#:  [id number]  DATE OF BIRTH:  09/10/1946  DATE OF ADMISSION:  12/07/2022  PRIMARY CARE PHYSICIAN: Merrilee Seashore, MD   Patient is coming from: Home  REQUESTING/REFERRING PHYSICIAN: Delora Fuel, MD  CHIEF COMPLAINT:   Chief Complaint  Patient presents with   Shortness of Breath    HISTORY OF PRESENT ILLNESS:  [name] is a 77 y.o. African-American male with medical history significant for COPD, type 2 diabetes mellitus, hypertension, RA, osteoarthritis and hypothyroidism, who presented to the ER with acute onset of acute respiratory distress with associated congested cough with inability to expectorate as well as wheezing which have been going on over the last 4 to 5 days got significantly worse since yesterday.  The patient was satting in the low 80s and high 90s on room air and was initially placed on 4 L of O2 by nasal cannula and later on on BiPAP.  He denies any fever or chills.  No nausea or vomiting or abdominal pain.  No chest pain or palpitations.  No dysuria, oliguria or hematuria or flank pain. He had 5 vaccine injections for Covid 19.  ED Course: When he came to the ED, BP was 103/58 with respiratory rate of 23 and pulse 70 was 96% on 4 L of O2 by nasal cannula and later on it dropped to 84%.  Sats were later 99% on 60% FiO2 on BiPAP.Labs reveal hypokalemia of 3.1 with blood glucose of 2 6 and magnesium 1.5 with high sensitive troponin I of 221 EKG as reviewed by me: Normal sinus rhythm with a rate of 90 with PACs, RVH and prolonged QT interval with QTc of 491 MS. Imaging: Portable chest x-ray showed chronic residual lung disease with no acute cardiopulmonary disease.  The patient was given 125 mg of IV Solu-Medrol, DuoNebs, 2 g IV magnesium sulfate, 40 mill [place]abbell p.o. potassium chloride and 10 mill equivalent IV potassium chloride.  He will be admitted to a progressive unit bed for further evaluation and  management. PAST MEDICAL HISTORY:   Past Medical History:  Diagnosis Date   Arthritis    COPD (chronic obstructive pulmonary disease) ([place])    Diabetes mellitus    Hypertension    Pneumonia    Thyroid disease     PAST SURGICAL HISTORY:   Past Surgical History:  Procedure Laterality Date   KNEE ARTHROSCOPY     NO PAST SURGERIES     RIGHT HEART CATH N/A 11/18/2020   Procedure: RIGHT HEART CATH;  Surgeon: Jolaine Artist, MD;  Location: [place] [facility];  Service: Cardiovascular;  Laterality: N/A;   RIGHT/LEFT HEART CATH AND CORONARY ANGIOGRAPHY N/A 05/21/2020   Procedure: RIGHT/LEFT HEART CATH AND CORONARY ANGIOGRAPHY;  Surgeon: Martinique, Peter M, MD;  Location: [place] [facility];  Service: Cardiovascular;  Laterality: N/A;    SOCIAL HISTORY:   Social History   Tobacco Use   Smoking status: Former    Packs/day: 0.50    Years: 10.00    Total pack years: 5.00    Types: Cigarettes    Quit date: 12/03/1984    Years since quitting: 38.0   Smokeless tobacco: Never   Tobacco comments:    Quit 40 years ago.   Substance Use Topics   Alcohol use: No    FAMILY HISTORY:   Family History  Problem Relation Age of Onset   Diabetes Mother    Stroke Mother  DRUG ALLERGIES:   Allergies  Allergen Reactions   Bactrim [Sulfamethoxazole-Trimethoprim] Shortness Of Breath    Patient states "got short winded"   Spiriva Respimat [Tiotropium Bromide Monohydrate] Rash   Ofev [Nintedanib]     REVIEW OF SYSTEMS:   ROS As per history of present illness. All pertinent systems were reviewed above. Constitutional, HEENT, cardiovascular, respiratory, GI, GU, musculoskeletal, neuro, psychiatric, endocrine, integumentary and hematologic systems were reviewed and are otherwise negative/unremarkable except for positive findings mentioned above in the HPI.   MEDICATIONS AT HOME:   Prior to Admission medications   Medication Sig Start Date End Date Taking? Authorizing Provider   acetaminophen (TYLENOL) 500 MG tablet Take 1,000 mg by mouth every 6 (six) hours as needed for moderate pain or headache.    [provider]  albuterol (VENTOLIN HFA) 108 (90 Base) MCG/ACT inhaler Inhale 2 puffs into the lungs every 6 (six) hours as needed. 11/22/17   [provider]  alfuzosin (UROXATRAL) 10 MG 24 hr tablet Take 1 tablet (10 mg total) by mouth daily with breakfast. 07/02/22   Bjorn Pippin, MD  amLODipine (NORVASC) 10 MG tablet Take 10 mg by mouth daily.    [provider]  aspirin 81 MG tablet Take 81 mg by mouth daily.    [provider]  atorvastatin (LIPITOR) 20 MG tablet Take 20 mg by mouth daily. 08/19/18   [provider]  azaTHIOprine (IMURAN) 50 MG tablet Take 100 mg by mouth daily.  04/13/19   [provider]  ciprofloxacin (CIPRO) 500 MG tablet Take 1 tablet (500 mg total) by mouth every 12 (twelve) hours. 10/01/22   Stoneking, Danford Bad., MD  finasteride (PROSCAR) 5 MG tablet Take 1 tablet (5 mg total) by mouth daily. 10/20/22   McKenzie, Mardene Celeste, MD  fluticasone (FLONASE) 50 MCG/ACT nasal spray Place 2 sprays into both nostrils daily. 11/08/20   Kalman Shan, MD  folic acid (FOLVITE) 400 MCG tablet Take 400 mcg by mouth daily.    [provider]  HYDROcodone bit-homatropine (HYCODAN) 5-1.5 MG/5ML syrup Take 5 mLs by mouth 2 (two) times daily as needed for cough. 12/15/21   Kalman Shan, MD  insulin NPH Human (NOVOLIN N) 100 UNIT/ML injection Inject 12 Units into the skin See admin instructions. Inject 10 units every morning and 20 units every evening    [provider]  Insulin Syringe-Needle U-100 (INSULIN SYRINGE .3CC/31GX5/16") 31G X 5/16" 0.3 ML MISC Inject 1 Syringe into the skin as directed. 02/27/20   [provider]  levothyroxine (SYNTHROID, LEVOTHROID) 25 MCG tablet Take 25 mcg by mouth daily.    [provider]  metFORMIN (GLUCOPHAGE-XR) 500 MG 24 hr tablet Take 2  tablets (1,000 mg total) by mouth 2 (two) times daily. 05/24/20   [place], Peter M, MD  pantoprazole (PROTONIX) 40 MG tablet Take 40 mg by mouth daily. 01/14/20   [provider]  predniSONE (DELTASONE) 1 MG tablet Take 3 mg by mouth daily. 02/12/21   [provider]  Tocilizumab (ACTEMRA ACTPEN) 162 MG/0.9ML SOAJ Inject 162 mg into the skin every 14 (fourteen) days. Courier to pulm: [address], [address], [place] [address]. Appt on 11/16/2022 11/06/22   Kalman Shan, MD  Treprostinil (TYVASO DPI MAINTENANCE KIT) 48 MCG POWD Inhale 48 mcg into the lungs in the morning, at noon, in the evening, and at bedtime. 11/17/22   Kalman Shan, MD  umeclidinium bromide (INCRUSE ELLIPTA) 62.5 MCG/ACT AEPB Inhale 1 puff into the  lungs daily. 02/24/22   Brand Males, MD      VITAL SIGNS:  Blood pressure 132/65, pulse (!) 106, temperature 97.7 F (36.5 C), temperature source Oral, resp. rate (!) 24, weight 58.5 kg, SpO2 96 %.  PHYSICAL EXAMINATION:  Physical Exam  GENERAL: Acutely ill 77 y.o.-year-old African-American male patient lying in the bed with mild to moderate respiratory distress with conversational dyspnea on BiPAP EYES: Pupils equal, round, reactive to light and accommodation. No scleral icterus. Extraocular muscles intact.  HEENT: Head atraumatic, normocephalic. Oropharynx and nasopharynx clear.  NECK:  Supple, no jugular venous distention. No thyroid enlargement, no tenderness.  LUNGS: Diffuse expiratory wheezes with tight expiratory airflow and harsh vesicular breathing.  CARDIOVASCULAR: Regular rate and rhythm, S1, S2 normal. No murmurs, rubs, or gallops.  ABDOMEN: Soft, nondistended, nontender. Bowel sounds present. No organomegaly or mass.  EXTREMITIES: Trace pedal edema, with no harsh cyanosis, or clubbing.  NEUROLOGIC: Cranial nerves II through XII are intact. Muscle strength 5/5 in all extremities. Sensation intact. Gait not checked.  PSYCHIATRIC: The  patient is alert and oriented x 3.  Normal affect and good eye contact. SKIN: No obvious rash, lesion, or ulcer.   LABORATORY PANEL:   CBC Recent Labs  Lab 12/07/22 2103  WBC 2.8*  HGB 8.8*  HCT 30.0*  PLT 51*   ------------------------------------------------------------------------------------------------------------------  Chemistries  Recent Labs  Lab 12/07/22 2103 12/08/22 0335  NA 137  --   K 3.1*  --   CL 98  --   CO2 30  --   GLUCOSE 206*  --   BUN 16  --   CREATININE 1.19  --   CALCIUM 7.9*  --   MG  --  1.5*   ------------------------------------------------------------------------------------------------------------------  Cardiac Enzymes No results for input(s): "TROPONINI" in the last 168 hours. ------------------------------------------------------------------------------------------------------------------  RADIOLOGY:  DG Chest 2 View  Result Date: 12/07/2022 CLINICAL DATA:  Increased short of breath EXAM: CHEST - 2 VIEW COMPARISON:  05/13/2022, CT 06/10/2022 FINDINGS: Chronic reticular interstitial opacity corresponding to fibrosis and chronic lung disease. No acute airspace disease or pleural effusion. Stable cardiomediastinal silhouette. No pneumothorax. IMPRESSION: No active cardiopulmonary disease. Chronic interstitial lung disease. Electronically Signed   By: Donavan Foil [name]   On: 12/07/2022 21:51      IMPRESSION AND PLAN:  Assessment and Plan: * Acute respiratory failure with hypoxia ([place]) - This is Secondary to COPD acute exacerbation. - He will be to admitted to a progressive unit bed. - We will taper BiPAP as tolerated. - Management otherwise as below.  COVID-19 virus infection - The patient will be placed on Paxlovid. - IV steroids will be provided given his hypoxia and COPD exacerbation. - He will be on vitamin C and zinc sulfate. - We will follow inflammatory markers that are currently elevated. - Given elevated D-dimer will  obtain chest CTA  Elevated troponin I level - This could be related to his COVID especially given elevated ferritin and CRP and LDH. - We will follow serial troponins. - Chest CTA will be obtained to rule out PE. - We will place him for now on IV heparin given elevated D-dimer.  COPD with acute exacerbation ([place]) - We will place the patient IV steroid therapy with IV Solu-Medrol as well as nebulized bronchodilator therapy with duonebs q.i.d. and q.4 hours p.r.n.[name] Kitchen - Mucolytic therapy will be provided with Mucinex and antibiotic therapy with IV Rocephin and Zithromax. - O2 protocol will be followed.   Type 2 diabetes mellitus ([facility]) -  The patient will be placed on supplement coverage with NovoLog. - We will hold off metformin. - We will continue basal coverage.  Hypokalemia - Potassium was replaced and will be followed.  Hypomagnesemia Magnesium was replaced.  Essential hypertension - We will continue his antihypertensives.  Dyslipidemia - We will continue statin therapy.  Rheumatoid arthritis -[name] on Actemra, Imuran and prednisone.  DVT prophylaxis: IV heparin Advanced Care Planning:  Code Status: The patient is DNR/DNI.  He has an out of facility DNR form and this was discussed with him. Family Communication:  The plan of care was discussed in details with the patient (and family). I answered all questions. The patient agreed to proceed with the above mentioned plan. Further management will depend upon hospital course. Disposition Plan: Back to previous home environment Consults called: none.  All the records are reviewed and case discussed with ED provider.  Status is: Inpatient   At the time of the admission, it appears that the appropriate admission status for this patient is inpatient.  This is judged to be reasonable and necessary in order to provide the required intensity of service to ensure the patient's safety given the presenting symptoms, physical exam  findings and initial radiographic and laboratory data in the context of comorbid conditions.  The patient requires inpatient status due to high intensity of service, high risk of further deterioration and high frequency of surveillance required.  I certify that at the time of admission, it is my clinical judgment that the patient will require inpatient hospital care extending more than 2 midnights.                            Dispo: The patient is from: Home              Anticipated d/c is to: Home              Patient currently is not medically stable to d/c.              Difficult to place patient: No  Authorized and performed by: Eugenie Norrie, MD Total critical care time: Approximately 60   minutes. Due to a high probability of clinically significant, life-threatening deterioration, the patient required my highest level of preparedness to intervene emergently and I personally spent this critical care time directly and personally managing the patient.  This critical care time included obtaining a history, examining the patient, pulse oximetry, ordering and review of studies, arranging urgent treatment with development of management plan, evaluation of patient's response to treatment, frequent reassessment, and discussions with other providers. This critical care time was performed to assess and manage the high probability of imminent, life-threatening deterioration that could result in multiorgan failure.  It was exclusive of separately billable procedures and treating other patients and teaching time.    Christel Mormon M.D on 12/08/2022 at 6:15 AM  [facility]   From 7 PM-7 AM, contact night-coverage [email or web address]  CC: Primary care physician; Merrilee Seashore, MD

## 2022-12-08 NOTE — Progress Notes (Signed)
Transported pt with RN from [place] to [id number] without complications

## 2022-12-08 NOTE — Progress Notes (Signed)
Pt has PRN BIPAP orders.  Pt stated his breathing is good right now. No WOB noted at this time.

## 2022-12-08 NOTE — Progress Notes (Signed)
ANTICOAGULATION CONSULT NOTE - Initial Consult  Pharmacy Consult for heparin Indication: chest pain/ACS and r/o PE  Allergies  Allergen Reactions   Bactrim [Sulfamethoxazole-Trimethoprim] Shortness Of Breath    Patient states "got short winded"   Spiriva Respimat [Tiotropium Bromide Monohydrate] Rash   Ofev [Nintedanib]     Patient Measurements: Weight: 58.5 kg (129 lb) Heparin Dosing Weight: 58kg  Vital Signs: Temp: 97.7 F (36.5 C) (02/06 0340) Temp Source: Oral (02/06 0340) BP: 132/65 (02/06 0345) Pulse Rate: 106 (02/06 0513)  Labs: Recent Labs    12/07/22 2103 12/08/22 0422  HGB 8.8*  --   HCT 30.0*  --   PLT 51*  --   CREATININE 1.19  --   TROPONINIHS  --  221*    Estimated Creatinine Clearance: 43 mL/min (by C-G formula based on SCr of 1.19 mg/dL).   Medical History: Past Medical History:  Diagnosis Date   Arthritis    COPD (chronic obstructive pulmonary disease) ([place])    Diabetes mellitus    Hypertension    Pneumonia    Thyroid disease     Medications:  (Not in a hospital admission)  Scheduled:   vitamin C  500 mg Oral Daily   famotidine  20 mg Oral BID   heparin  3,500 Units Intravenous Once   ipratropium-albuterol  3 mL Nebulization QID   methylPREDNISolone (SOLU-MEDROL) injection  1 mg/kg Intravenous BID   Followed by   Derrill Memo ON 12/11/2022] predniSONE  50 mg Oral Daily   nirmatrelvir/ritonavir  3 tablet Oral BID   potassium chloride  40 mEq Oral Once   zinc sulfate  220 mg Oral Daily   Infusions:   0.9 % NaCl with KCl 20 mEq / L 100 mL/hr at 12/08/22 0521   cefTRIAXone (ROCEPHIN)  IV     heparin     magnesium sulfate bolus IVPB      Assessment: Pt was admitted for SOB. [name] positive for COVID. Heparin ordered pending r/o for PE vs MI.  Scr 1.19 Hgb 8.8 Plt 51k  Goal of Therapy:  Heparin level 0.3-0.7 units/ml Monitor platelets by anticoagulation protocol: Yes   Plan:  Heparin bolus 3500 units x1 Heparin infusion 850  units/hr 8 hr HL Daily HL and CBC Monitor for bleeding  Onnie Boer, PharmD, BCIDP, AAHIVP, CPP Infectious Disease Pharmacist 12/08/2022 6:40 AM

## 2022-12-08 NOTE — ED Notes (Signed)
ED TO INPATIENT HANDOFF REPORT  ED Nurse Name and Phone #: [phone]  S Name/Age/Gender [name] 77 y.o. male Room/Bed: 004C/004C  Code Status   Code Status: DNR  Home/SNF/Other Home Patient oriented to: self, place, time, and situation Is this baseline? Yes   Triage Complete: Triage complete  Chief Complaint Acute respiratory failure with hypoxia ([place]) [J96.01]  Triage Note Pt via POV with sister. Endorses feelings of SOB, no CP, with low SpO2 readings. Home SpO2 at 70s/80s on his chronic 4 L oxygen. Pt states chronic dry cough. No fevers/chills. No known sick exposure. Pt 96% on chronic 4 L oxygen.    Allergies Allergies  Allergen Reactions   Bactrim [Sulfamethoxazole-Trimethoprim] Shortness Of Breath    Patient states "got short winded"   Spiriva Respimat [Tiotropium Bromide Monohydrate] Rash   Ofev [Nintedanib]     Level of Care/Admitting Diagnosis ED Disposition     ED Disposition  Admit   Condition  --   [place]: [place] [[id number]]  Level of Care: Progressive [102]  Admit to Progressive based on following criteria: RESPIRATORY PROBLEMS hypoxemic/hypercapnic respiratory failure that is responsive to NIPPV (BiPAP) or High Flow Nasal Cannula (6-80 lpm). Frequent assessment/intervention, no > Q2 hrs < Q4 hrs, to maintain oxygenation and pulmonary hygiene.  May admit patient to [facility] or [facility] if equivalent level of care is available:: No  Covid Evaluation: Confirmed COVID Positive  Diagnosis: Acute respiratory failure with hypoxia [facility]) [[id number]]  Admitting Physician: Christel Mormon [[id number]]  Attending Physician: Christel Mormon [[id number]]  Certification:: I certify this patient will need inpatient services for at least 2 midnights  Estimated Length of Stay: 2          B Medical/Surgery History Past Medical History:  Diagnosis Date   Arthritis    COPD (chronic obstructive pulmonary disease) ([place])     Diabetes mellitus    Hypertension    Pneumonia    Thyroid disease    Past Surgical History:  Procedure Laterality Date   KNEE ARTHROSCOPY     NO PAST SURGERIES     RIGHT HEART CATH N/A 11/18/2020   Procedure: RIGHT HEART CATH;  Surgeon: Jolaine Artist, MD;  Location: [place] [facility];  Service: Cardiovascular;  Laterality: N/A;   RIGHT/LEFT HEART CATH AND CORONARY ANGIOGRAPHY N/A 05/21/2020   Procedure: RIGHT/LEFT HEART CATH AND CORONARY ANGIOGRAPHY;  Surgeon: Martinique, Peter M, MD;  Location: [place] [facility];  Service: Cardiovascular;  Laterality: N/A;     A IV Location/Drains/Wounds Patient Lines/Drains/Airways Status     Active Line/Drains/Airways     Name Placement date Placement time Site Days   Peripheral IV 12/08/22 20 G Right Antecubital 12/08/22  0250  Antecubital  less than 1   Peripheral IV 12/08/22 20 G Anterior;Left Forearm 12/08/22  0340  Forearm  less than 1            Intake/Output Last 24 hours No intake or output data in the 24 hours ending 12/08/22 1041  Labs/Imaging Results for orders placed or performed during the hospital encounter of 12/07/22 (from the past 48 hour(s))  Resp panel by RT-PCR (RSV, Flu A&B, Covid) Anterior Nasal Swab     Status: Abnormal   Collection Time: 12/07/22  8:32 PM   Specimen: Anterior Nasal Swab  Result Value Ref Range   SARS Coronavirus 2 by RT PCR POSITIVE (A) NEGATIVE   Influenza A by PCR NEGATIVE NEGATIVE   Influenza  B by PCR NEGATIVE NEGATIVE    Comment: (NOTE) The Xpert Xpress SARS-CoV-2/FLU/RSV plus assay is intended as an aid in the diagnosis of influenza from Nasopharyngeal swab specimens and should not be used as a sole basis for treatment. Nasal washings and aspirates are unacceptable for Xpert Xpress SARS-CoV-2/FLU/RSV testing.  Fact Sheet for Patients: EntrepreneurPulse.com.au  Fact Sheet for Healthcare Providers: IncredibleEmployment.be  This test is not yet  approved or cleared by the [place] FDA and has been authorized for detection and/or diagnosis of SARS-CoV-2 by FDA under an Emergency Use Authorization (EUA). This EUA will remain in effect (meaning this test can be used) for the duration of the COVID-19 declaration under Section 564(b)(1) of the Act, 21 U.S.C. section 360bbb-3(b)(1), unless the authorization is terminated or revoked.     Resp Syncytial Virus by PCR NEGATIVE NEGATIVE    Comment: (NOTE) Fact Sheet for Patients: EntrepreneurPulse.com.au  Fact Sheet for Healthcare Providers: IncredibleEmployment.be  This test is not yet approved or cleared by the [place] FDA and has been authorized for detection and/or diagnosis of SARS-CoV-2 by FDA under an Emergency Use Authorization (EUA). This EUA will remain in effect (meaning this test can be used) for the duration of the COVID-19 declaration under Section 564(b)(1) of the Act, 21 U.S.C. section 360bbb-3(b)(1), unless the authorization is terminated or revoked.  Performed at [facility], [place] [address] [place], [place] [address]   Basic metabolic panel     Status: Abnormal   Collection Time: 12/07/22  9:03 PM  Result Value Ref Range   Sodium 137 135 - 145 mmol/L   Potassium 3.1 (L) 3.5 - 5.1 mmol/L   Chloride 98 98 - 111 mmol/L   CO2 30 22 - 32 mmol/L   Glucose, Bld 206 (H) 70 - 99 mg/dL    Comment: Glucose reference range applies only to samples taken after fasting for at least 8 hours.   BUN 16 8 - 23 mg/dL   Creatinine, Ser 1.19 0.61 - 1.24 mg/dL   Calcium 7.9 (L) 8.9 - 10.3 mg/dL   GFR, Estimated >60 >60 mL/min    Comment: (NOTE) Calculated using the CKD-EPI Creatinine Equation (2021)    Anion gap 9 5 - 15    Comment: Performed at [place] [address], [place], [place] [address]  CBC with Differential     Status: Abnormal   Collection Time: 12/07/22  9:03 PM  Result Value Ref Range   WBC 2.8  (L) 4.0 - 10.5 K/uL   RBC 3.24 (L) 4.22 - 5.81 MIL/uL   Hemoglobin 8.8 (L) 13.0 - 17.0 g/dL   HCT 30.0 (L) 39.0 - 52.0 %   MCV 92.6 80.0 - 100.0 fL   MCH 27.2 26.0 - 34.0 pg   MCHC 29.3 (L) 30.0 - 36.0 g/dL   RDW 21.8 (H) 11.5 - 15.5 %   Platelets 51 (L) 150 - 400 K/uL    Comment: SPECIMEN CHECKED FOR CLOTS Immature Platelet Fraction may be clinically indicated, consider ordering this additional test [id number] REPEATED TO VERIFY PLATELET COUNT CONFIRMED BY SMEAR    nRBC 0.7 (H) 0.0 - 0.2 %   Neutrophils Relative % 76 %   Neutro Abs 2.2 1.7 - 7.7 K/uL   Lymphocytes Relative 10 %   Lymphs Abs 0.3 (L) 0.7 - 4.0 K/uL   Monocytes Relative 6 %   Monocytes Absolute 0.2 0.1 - 1.0 K/uL   Eosinophils Relative 3 %   Eosinophils Absolute  0.1 0.0 - 0.5 K/uL   Basophils Relative 3 %   Basophils Absolute 0.1 0.0 - 0.1 K/uL   WBC Morphology MORPHOLOGY UNREMARKABLE    Immature Granulocytes 2 %   Abs Immature Granulocytes 0.05 0.00 - 0.07 K/uL   Bite Cells PRESENT    Tear Drop Cells PRESENT     Comment: Performed at [facility], [address], [place], [place] [address]  Magnesium     Status: Abnormal   Collection Time: 12/08/22  3:35 AM  Result Value Ref Range   Magnesium 1.5 (L) 1.7 - 2.4 mg/dL    Comment: Performed at [facility], [address], [place], [place] [address]  Ferritin     Status: Abnormal   Collection Time: 12/08/22  4:22 AM  Result Value Ref Range   Ferritin 408 (H) 24 - 336 ng/mL    Comment: Performed at [facility], [address] [place], [place] [address]  Lactate dehydrogenase     Status: Abnormal   Collection Time: 12/08/22  4:22 AM  Result Value Ref Range   LDH 264 (H) 98 - 192 U/L    Comment: Performed at [facility], [address], [place], [place] [address]  Procalcitonin     Status: None   Collection Time: 12/08/22  4:22 AM  Result Value Ref Range   Procalcitonin 0.49 ng/mL    Comment:        Interpretation: PCT  (Procalcitonin) <= 0.5 ng/mL: Systemic infection (sepsis) is not likely. Local bacterial infection is possible. (NOTE)       Sepsis PCT Algorithm           Lower Respiratory Tract                                      Infection PCT Algorithm    ----------------------------     ----------------------------         PCT < 0.25 ng/mL                PCT < 0.10 ng/mL          Strongly encourage             Strongly discourage   discontinuation of antibiotics    initiation of antibiotics    ----------------------------     -----------------------------       PCT 0.25 - 0.50 ng/mL            PCT 0.10 - 0.25 ng/mL               OR       >80% decrease in PCT            Discourage initiation of                                            antibiotics      Encourage discontinuation           of antibiotics    ----------------------------     -----------------------------         PCT >= 0.50 ng/mL              PCT 0.26 - 0.50 ng/mL  AND        <80% decrease in PCT             Encourage initiation of                                             antibiotics       Encourage continuation           of antibiotics    ----------------------------     -----------------------------        PCT >= 0.50 ng/mL                  PCT > 0.50 ng/mL               AND         increase in PCT                  Strongly encourage                                      initiation of antibiotics    Strongly encourage escalation           of antibiotics                                     -----------------------------                                           PCT <= 0.25 ng/mL                                                 OR                                        > 80% decrease in PCT                                      Discontinue / Do not initiate                                             antibiotics  Performed at [facility], [address], [place], [place] [address]   Troponin I (High  Sensitivity)     Status: Abnormal   Collection Time: 12/08/22  4:22 AM  Result Value Ref Range   Troponin I (High Sensitivity) 221 (HH) <18 ng/L    Comment: CRITICAL RESULT CALLED TO, READ BACK BY AND VERIFIED WITH BRITTANY BECK RN 12/08/22 0535 M KOROLESKI (NOTE) Elevated high sensitivity troponin I (hsTnI) values and significant  changes across serial measurements may suggest ACS but many other  chronic and acute conditions are known to elevate  hsTnI results.  Refer to the "Links" section for chest pain algorithms and additional  guidance. Performed at [facility], [place] [address] [place], [place] [address]   D-dimer, quantitative     Status: Abnormal   Collection Time: 12/08/22  4:22 AM  Result Value Ref Range   D-Dimer, Quant 2.84 (H) 0.00 - 0.50 ug/mL-FEU    Comment: (NOTE) At the manufacturer cut-off value of 0.5 g/mL FEU, this assay has a negative predictive value of 95-100%.This assay is intended for use in conjunction with a clinical pretest probability (PTP) assessment model to exclude pulmonary embolism (PE) and deep venous thrombosis (DVT) in outpatients suspected of PE or DVT. Results should be correlated with clinical presentation. Performed at [facility], [place] [address], [place], [place] [address]   C-reactive protein     Status: Abnormal   Collection Time: 12/08/22  4:22 AM  Result Value Ref Range   CRP 11.8 (H) <1.0 mg/dL    Comment: Performed at [place] [address] [place], [place] [address]  Troponin I (High Sensitivity)     Status: Abnormal   Collection Time: 12/08/22  6:58 AM  Result Value Ref Range   Troponin I (High Sensitivity) 282 (HH) <18 ng/L    Comment: CRITICAL VALUE NOTED. VALUE IS CONSISTENT WITH PREVIOUSLY REPORTED/CALLED VALUE (NOTE) Elevated high sensitivity troponin I (hsTnI) values and significant  changes across serial measurements may suggest ACS but many other  chronic and acute conditions are known to  elevate hsTnI results.  Refer to the "Links" section for chest pain algorithms and additional  guidance. Performed at [facility], [place] [address] [place], [place] [address]    DG Chest 2 View  Result Date: 12/07/2022 CLINICAL DATA:  Increased short of breath EXAM: CHEST - 2 VIEW COMPARISON:  05/13/2022, CT 06/10/2022 FINDINGS: Chronic reticular interstitial opacity corresponding to fibrosis and chronic lung disease. No acute airspace disease or pleural effusion. Stable cardiomediastinal silhouette. No pneumothorax. IMPRESSION: No active cardiopulmonary disease. Chronic interstitial lung disease. Electronically Signed   By: Donavan Foil [name]   On: 12/07/2022 21:51    Pending Labs Unresulted Labs (From admission, onward)     Start     Ordered   12/09/22 0500  CBC with Differential/Platelet  Daily,   R      12/08/22 0403   12/09/22 0500  Comprehensive metabolic panel  Daily,   R      12/08/22 0403   12/09/22 0500  Ferritin  Daily,   R      12/08/22 0403   12/09/22 0500  C-reactive protein  Daily,   R      12/08/22 0403   12/09/22 0500  Heparin level (unfractionated)  Daily,   R      12/08/22 0642   12/09/22 0500  Magnesium  Tomorrow morning,   R        12/08/22 0754   12/08/22 1500  Heparin level (unfractionated)  Once-Timed,   TIMED        12/08/22 0637            Vitals/Pain Today's Vitals   12/08/22 0847 12/08/22 0900 12/08/22 0921 12/08/22 0930  BP: 105/62 122/67  112/64  Pulse: 93 96 90 91  Resp: (!) 22 (!) 31 (!) 24 (!) 21  Temp: 99.3 F (37.4 C)     TempSrc: Axillary     SpO2: 100% 91% 94% 97%  Weight:  PainSc: 0-No pain       Isolation Precautions Airborne and Contact precautions  Medications Medications  0.9 % NaCl with KCl 20 mEq/ L  infusion ( Intravenous New Bag/Given 12/08/22 0521)  nirmatrelvir/ritonavir (PAXLOVID) 3 tablet (has no administration in time range)  methylPREDNISolone sodium succinate (SOLU-MEDROL) 125 mg/2 mL injection 58.75  mg (58.75 mg Intravenous Given 12/08/22 2641)    Followed by  predniSONE (DELTASONE) tablet 50 mg (has no administration in time range)  guaiFENesin-dextromethorphan (ROBITUSSIN DM) 100-10 MG/5ML syrup 10 mL (has no administration in time range)  chlorpheniramine-HYDROcodone (TUSSIONEX) 10-8 MG/5ML suspension 5 mL (has no administration in time range)  ascorbic acid (VITAMIN C) tablet 500 mg (has no administration in time range)  zinc sulfate capsule 220 mg (has no administration in time range)  famotidine (PEPCID) tablet 20 mg (has no administration in time range)  traZODone (DESYREL) tablet 25 mg (has no administration in time range)  magnesium hydroxide (MILK OF MAGNESIA) suspension 30 mL (has no administration in time range)  ondansetron (ZOFRAN) tablet 4 mg (has no administration in time range)    Or  ondansetron (ZOFRAN) injection 4 mg (has no administration in time range)  ipratropium-albuterol (DUONEB) 0.5-2.5 (3) MG/3ML nebulizer solution 3 mL (3 mLs Nebulization Given 12/08/22 0916)  cefTRIAXone (ROCEPHIN) 2 g in sodium chloride 0.9 % 100 mL IVPB (0 g Intravenous Stopped 12/08/22 0914)  potassium chloride SA (KLOR-CON M) CR tablet 40 mEq (has no administration in time range)  heparin bolus via infusion 3,500 Units (3,500 Units Intravenous Bolus from Bag 12/08/22 0700)    Followed by  heparin ADULT infusion 100 units/mL (25000 units/280mL) (850 Units/hr Intravenous New Bag/Given 12/08/22 0659)  insulin aspart (novoLOG) injection 0-9 Units (has no administration in time range)  insulin aspart (novoLOG) injection 0-5 Units (has no administration in time range)  albuterol (PROVENTIL) (2.5 MG/3ML) 0.083% nebulizer solution 3 mL (3 mLs Nebulization Given 12/08/22 0326)  methylPREDNISolone sodium succinate (SOLU-MEDROL) 125 mg/2 mL injection 125 mg (125 mg Intravenous Given 12/08/22 0309)  magnesium sulfate IVPB 2 g 50 mL (0 g Intravenous Stopped 12/08/22 0505)  potassium chloride 10 mEq in 100 mL IVPB (0  mEq Intravenous Stopped 12/08/22 0505)  potassium chloride SA (KLOR-CON M) CR tablet 40 mEq (40 mEq Oral Given 12/08/22 0308)  magnesium sulfate IVPB 2 g 50 mL (2 g Intravenous New Bag/Given 12/08/22 0934)    Mobility walks with device     Focused Assessments Pulmonary Assessment Handoff:  Lung sounds: Bilateral Breath Sounds: Clear, Diminished O2 Device: (S) Nasal Cannula (Per Someone*) O2 Flow Rate (L/min): 5 L/min    R Recommendations: See Admitting Provider Note  Report given to:   Additional Notes: Home SpO2 at 70s/80s on his chronic 4 L oxygen. Pt states chronic dry cough. No fevers/chills. No known sick exposure. Pt 96% on chronic 4 L oxygen.  pt is covid positive. Ws on bipap, but now on [place] at 5 liter

## 2022-12-08 NOTE — ED Notes (Signed)
Phlebotomy requested to draw pt's labs.

## 2022-12-08 NOTE — Assessment & Plan Note (Addendum)
Glucose controlled - Hold metformin and NPH - Continue SS corrections

## 2022-12-08 NOTE — ED Notes (Signed)
MD notified of pt's vital signs.

## 2022-12-08 NOTE — Assessment & Plan Note (Signed)
-   We will continue his antihypertensives. 

## 2022-12-09 ENCOUNTER — Inpatient Hospital Stay (HOSPITAL_COMMUNITY): Payer: Medicare Other

## 2022-12-09 DIAGNOSIS — I2609 Other pulmonary embolism with acute cor pulmonale: Secondary | ICD-10-CM

## 2022-12-09 DIAGNOSIS — L8990 Pressure ulcer of unspecified site, unspecified stage: Secondary | ICD-10-CM | POA: Diagnosis present

## 2022-12-09 DIAGNOSIS — J441 Chronic obstructive pulmonary disease with (acute) exacerbation: Secondary | ICD-10-CM | POA: Diagnosis not present

## 2022-12-09 DIAGNOSIS — D696 Thrombocytopenia, unspecified: Secondary | ICD-10-CM | POA: Diagnosis present

## 2022-12-09 DIAGNOSIS — U071 COVID-19: Secondary | ICD-10-CM | POA: Diagnosis not present

## 2022-12-09 DIAGNOSIS — I272 Pulmonary hypertension, unspecified: Secondary | ICD-10-CM | POA: Diagnosis present

## 2022-12-09 DIAGNOSIS — J9621 Acute and chronic respiratory failure with hypoxia: Secondary | ICD-10-CM | POA: Diagnosis not present

## 2022-12-09 DIAGNOSIS — I2699 Other pulmonary embolism without acute cor pulmonale: Secondary | ICD-10-CM | POA: Diagnosis not present

## 2022-12-09 LAB — CBC WITH DIFFERENTIAL/PLATELET
Abs Immature Granulocytes: 0 10*3/uL (ref 0.00–0.07)
Basophils Absolute: 0.2 10*3/uL — ABNORMAL HIGH (ref 0.0–0.1)
Basophils Relative: 7 %
Eosinophils Absolute: 0 10*3/uL (ref 0.0–0.5)
Eosinophils Relative: 0 %
HCT: 26.6 % — ABNORMAL LOW (ref 39.0–52.0)
Hemoglobin: 8.2 g/dL — ABNORMAL LOW (ref 13.0–17.0)
Lymphocytes Relative: 3 %
Lymphs Abs: 0.1 10*3/uL — ABNORMAL LOW (ref 0.7–4.0)
MCH: 27.9 pg (ref 26.0–34.0)
MCHC: 30.8 g/dL (ref 30.0–36.0)
MCV: 90.5 fL (ref 80.0–100.0)
Monocytes Absolute: 0.1 10*3/uL (ref 0.1–1.0)
Monocytes Relative: 2 %
Neutro Abs: 2.7 10*3/uL (ref 1.7–7.7)
Neutrophils Relative %: 88 %
Platelets: 43 10*3/uL — ABNORMAL LOW (ref 150–400)
RBC: 2.94 MIL/uL — ABNORMAL LOW (ref 4.22–5.81)
RDW: 22.4 % — ABNORMAL HIGH (ref 11.5–15.5)
WBC: 3.1 10*3/uL — ABNORMAL LOW (ref 4.0–10.5)
nRBC: 0 % (ref 0.0–0.2)
nRBC: 1 /100 WBC — ABNORMAL HIGH

## 2022-12-09 LAB — ECHOCARDIOGRAM COMPLETE
AR max vel: 2.99 cm2
AV Area VTI: 2.96 cm2
AV Area mean vel: 2.82 cm2
AV Mean grad: 2 mmHg
AV Peak grad: 3.2 mmHg
Ao pk vel: 0.9 m/s
Area-P 1/2: 2.54 cm2
Height: 66 in
S' Lateral: 3.1 cm
Weight: 2352.75 oz

## 2022-12-09 LAB — HEPARIN LEVEL (UNFRACTIONATED)
Heparin Unfractionated: 0.45 IU/mL (ref 0.30–0.70)
Heparin Unfractionated: 0.63 IU/mL (ref 0.30–0.70)
Heparin Unfractionated: 0.54 IU/mL (ref 0.30–0.70)

## 2022-12-09 LAB — COMPREHENSIVE METABOLIC PANEL
ALT: 18 U/L (ref 0–44)
AST: 24 U/L (ref 15–41)
Albumin: 2.4 g/dL — ABNORMAL LOW (ref 3.5–5.0)
Alkaline Phosphatase: 28 U/L — ABNORMAL LOW (ref 38–126)
Anion gap: 10 (ref 5–15)
BUN: 24 mg/dL — ABNORMAL HIGH (ref 8–23)
CO2: 24 mmol/L (ref 22–32)
Calcium: 7.5 mg/dL — ABNORMAL LOW (ref 8.9–10.3)
Chloride: 104 mmol/L (ref 98–111)
Creatinine, Ser: 1.19 mg/dL (ref 0.61–1.24)
GFR, Estimated: >60 mL/min (ref >60)
Glucose, Bld: 235 mg/dL — ABNORMAL HIGH (ref 70–99)
Potassium: 4.7 mmol/L (ref 3.5–5.1)
Sodium: 138 mmol/L (ref 135–145)
Total Bilirubin: 0.9 mg/dL (ref 0.3–1.2)
Total Protein: 4.9 g/dL — ABNORMAL LOW (ref 6.5–8.1)

## 2022-12-09 LAB — GLUCOSE, CAPILLARY
Glucose-Capillary: 193 mg/dL — ABNORMAL HIGH (ref 70–99)
Glucose-Capillary: 320 mg/dL — ABNORMAL HIGH (ref 70–99)
Glucose-Capillary: 177 mg/dL — ABNORMAL HIGH (ref 70–99)
Glucose-Capillary: 185 mg/dL — ABNORMAL HIGH (ref 70–99)
Glucose-Capillary: 91 mg/dL (ref 70–99)

## 2022-12-09 LAB — LEGIONELLA PNEUMOPHILA SEROGP 1 UR AG: L. pneumophila Serogp 1 Ur Ag: NEGATIVE

## 2022-12-09 LAB — C-REACTIVE PROTEIN: CRP: 12.6 mg/dL — ABNORMAL HIGH (ref <1.0)

## 2022-12-09 LAB — MAGNESIUM: Magnesium: 2.4 mg/dL (ref 1.7–2.4)

## 2022-12-09 LAB — FERRITIN: Ferritin: 478 ng/mL — ABNORMAL HIGH (ref 24–336)

## 2022-12-09 MED ORDER — PREDNISONE 50 MG PO TABS
50.0000 mg | ORAL_TABLET | Freq: Every day | ORAL | Status: DC
  Administered 2022-12-11 – 2022-12-17 (×7): 50 mg via ORAL
  Filled 2022-12-09 (×7): qty 1

## 2022-12-09 MED ORDER — INSULIN ASPART 100 UNIT/ML IJ SOLN
0.0000 [IU] | Freq: Three times a day (TID) | INTRAMUSCULAR | Status: DC
  Administered 2022-12-09: 11 [IU] via SUBCUTANEOUS
  Administered 2022-12-09: 3 [IU] via SUBCUTANEOUS
  Administered 2022-12-10: 1 [IU] via SUBCUTANEOUS
  Administered 2022-12-10: 3 [IU] via SUBCUTANEOUS
  Administered 2022-12-11 (×2): 5 [IU] via SUBCUTANEOUS
  Administered 2022-12-11 – 2022-12-12 (×2): 3 [IU] via SUBCUTANEOUS
  Administered 2022-12-12: 8 [IU] via SUBCUTANEOUS
  Administered 2022-12-13: 2 [IU] via SUBCUTANEOUS
  Administered 2022-12-13: 3 [IU] via SUBCUTANEOUS
  Administered 2022-12-14: 2 [IU] via SUBCUTANEOUS
  Administered 2022-12-14 – 2022-12-15 (×3): 3 [IU] via SUBCUTANEOUS
  Administered 2022-12-16: 2 [IU] via SUBCUTANEOUS
  Administered 2022-12-16 – 2022-12-18 (×3): 3 [IU] via SUBCUTANEOUS
  Administered 2022-12-18: 8 [IU] via SUBCUTANEOUS

## 2022-12-09 MED ORDER — METHOCARBAMOL 1000 MG/10ML IJ SOLN
500.0000 mg | Freq: Once | INTRAVENOUS | Status: AC
  Administered 2022-12-09: 500 mg via INTRAVENOUS
  Filled 2022-12-09: qty 500

## 2022-12-09 MED ORDER — METHYLPREDNISOLONE SODIUM SUCC 125 MG IJ SOLR
60.0000 mg | Freq: Two times a day (BID) | INTRAMUSCULAR | Status: AC
  Administered 2022-12-09 – 2022-12-10 (×4): 60 mg via INTRAVENOUS
  Filled 2022-12-09 (×4): qty 2

## 2022-12-09 MED ORDER — INSULIN ASPART 100 UNIT/ML IJ SOLN
3.0000 [IU] | Freq: Three times a day (TID) | INTRAMUSCULAR | Status: DC
  Administered 2022-12-09 – 2022-12-18 (×20): 3 [IU] via SUBCUTANEOUS

## 2022-12-09 NOTE — Progress Notes (Signed)
ANTICOAGULATION CONSULT NOTE - Follow Up Consult  Pharmacy Consult for heparin Indication: pulmonary embolus  Labs: Recent Labs    12/07/22 2103 12/08/22 0422 12/08/22 0658 12/08/22 1521 12/09/22 0122  HGB 8.8*  --   --   --   --   HCT 30.0*  --   --   --   --   PLT 51*  --   --   --   --   HEPARINUNFRC  --   --   --  <0.10* 0.45  CREATININE 1.19  --   --   --  1.19  TROPONINIHS  --  221* 282*  --   --      Assessment: 77yo male subtherapeutic on heparin with initial dosing for PE (CT confirmed today, shows evidence of RHS); no signs of bleeding per RN but she notes that IV is in [facility] and is frequently occluded.  HL came back therapeutic this AM. We will check another level this AM.  Goal of Therapy:  Heparin level 0.3-0.7 units/ml   Plan:  Continue heparin at 1000 units/hr Check 6 hr confirm level  Onnie Boer, PharmD, BCIDP, AAHIVP, CPP Infectious Disease Pharmacist 12/09/2022 3:00 AM

## 2022-12-09 NOTE — Inpatient Diabetes Management (Signed)
Inpatient Diabetes Program Recommendations  AACE/ADA: New Consensus Statement on Inpatient Glycemic Control (2015)  Target Ranges:  Prepandial:   less than 140 mg/dL      Peak postprandial:   less than 180 mg/dL (1-2 hours)      Critically ill patients:  140 - 180 mg/dL   Lab Results  Component Value Date   GLUCAP 193 (H) 12/09/2022   HGBA1C 7.1 (H) 11/14/2020    Latest Reference Range & Units 12/08/22 12:44 12/08/22 16:25 12/08/22 20:56 12/09/22 06:17  Glucose-Capillary 70 - 99 mg/dL 161 (H) 301 (H) 334 (H) 193 (H)  (H): Data is abnormally high  Diabetes history: DM2 Outpatient Diabetes medications: Novolin N 2-3 units qd for elevated CBGs (?), Metformin 500 mg bid Current orders for Inpatient glycemic control: Novolog 0-15 units tid, 0-5 units hs, Solumedrol 60 mg q 12 hrs., then 2/9 change to Prednisone 50 qd  Inpatient Diabetes Program Recommendations:   While on current steroids, please consider: -Add Carb mod to diet if eating well -Add Novolog 5 units tid meal coverage if eats 50% meals  Thank you, Nani Gasser. Gissel Keilman, RN, MSN, CDE  Diabetes Coordinator Inpatient Glycemic Control Team Team Pager [phone] (8am-5pm) 12/09/2022 10:23 AM

## 2022-12-09 NOTE — Assessment & Plan Note (Addendum)
Echo shows severe pulmonary hypertension and TR, estimated pressures may be higher than last echo.  Got Lasix 2/9 - Consult Pulmonology appreciate cares. - Continue Treprostinil.

## 2022-12-09 NOTE — Assessment & Plan Note (Signed)
Continue levothyroxine 

## 2022-12-09 NOTE — Progress Notes (Signed)
  Echocardiogram 2D Echocardiogram has been performed.  [name] 12/09/2022, 2:48 PM

## 2022-12-09 NOTE — Consult Note (Signed)
   [facility] CM Inpatient Consult   12/09/2022  [name] [birth year] [id number]  [facility] [ACO] Patient: [employer] Medicare  Primary Care Provider:  Merrilee Seashore, MD with [facility] which is listed for the transition of care follow up  *Patient is on airborne precautions - COVID -108 patient is a readmission from [facility] noted  Patient screened for readmission  hospitalization with noted high risk score for unplanned readmission risk to assess for potential [place] [facility] service needs for post hospital transition for care coordination.  Review of patient's electronic medical record reveals patient is from [facility] with [facility].  Call attempts to patient at this time was not successful, will attempt  Plan:  Continue to follow progress and disposition to assess for post hospital community care coordination/management needs.  Referral request for community care coordination:  Of note, [name] does not replace or interfere with any arrangements made by the Inpatient Transition of Care team.  For questions contact:   Natividad Brood, RN BSN [place]  [phone] business mobile phone Toll free office [phone]  *[place]  [phone] Fax number: [phone] [email or web address]

## 2022-12-09 NOTE — Assessment & Plan Note (Addendum)
Sacrum, stage II, present on admission. Wound care consulted.

## 2022-12-09 NOTE — Assessment & Plan Note (Signed)
See below

## 2022-12-09 NOTE — Progress Notes (Signed)
ANTICOAGULATION CONSULT NOTE - Follow Up Consult  Pharmacy Consult for Heparin infusion Indication: pulmonary embolus  Allergies  Allergen Reactions   Bactrim [Sulfamethoxazole-Trimethoprim] Shortness Of Breath    Patient states "got short winded"   Spiriva Respimat [Tiotropium Bromide Monohydrate] Rash   Ofev [Nintedanib] Other (See Comments)    Unknown     Patient Measurements: Weight: 66.7 kg (147 lb 0.8 oz) Heparin Dosing Weight: 66.7 kg  Vital Signs: Temp: 97.3 F (36.3 C) (02/07 0739) Temp Source: Oral (02/07 0739) BP: 108/65 (02/07 0739) Pulse Rate: 101 (02/07 0739)  Labs: Recent Labs    12/07/22 2103 12/08/22 0422 12/08/22 0658 12/08/22 1521 12/09/22 0122 12/09/22 0656  HGB 8.8*  --   --   --  8.2*  --   HCT 30.0*  --   --   --  26.6*  --   PLT 51*  --   --   --  43*  --   HEPARINUNFRC  --   --   --  <0.10* 0.45 0.63  CREATININE 1.19  --   --   --  1.19  --   TROPONINIHS  --  221* 282*  --   --   --     Estimated Creatinine Clearance: 46.9 mL/min (by C-G formula based on SCr of 1.19 mg/dL).   Medications:  Scheduled:   [START ON 12/13/2022] alfuzosin  10 mg Oral Q breakfast   amLODipine  10 mg Oral Daily   vitamin C  500 mg Oral Daily   aspirin  81 mg Oral Daily   atorvastatin  20 mg Oral Daily   famotidine  20 mg Oral BID   finasteride  5 mg Oral Daily   fluticasone  2 spray Each Nare Daily   folic acid  102 mcg Oral Daily   insulin aspart  0-5 Units Subcutaneous QHS   insulin aspart  0-9 Units Subcutaneous TID WC   ipratropium-albuterol  3 mL Nebulization TID   levothyroxine  25 mcg Oral Q0600   methylPREDNISolone (SOLU-MEDROL) injection  60 mg Intravenous BID   Followed by   Derrill Memo ON 12/11/2022] predniSONE  50 mg Oral Daily   nirmatrelvir/ritonavir  3 tablet Oral BID   pantoprazole  40 mg Oral Daily   Treprostinil  48 mcg Inhalation 4 times per day   zinc sulfate  220 mg Oral Daily   Infusions:   0.9 % NaCl with KCl 20 mEq / L Stopped  (12/09/22 0346)   cefTRIAXone (ROCEPHIN)  IV Stopped (12/08/22 0914)   heparin 1,000 Units/hr (12/09/22 7253)    Assessment: 77 yo M admitted for COVID pneumonia and found to have bilateral inferior posterior segmental submassive PE w/ evidence of R heart strain on CT PE from 12/08/22. Pt was not taking any anticoagulation prior to admission. Pharmacy consulted to dose heparin for PE.   Heparin level 0.63, therapeutic Current heparin infusion rate: 1000 units/hr  Hgb 8.2 - stable Plt gradually trending down from 51 to 43 this morning. Plt 51 on admission prior to heparin initiation. No s/sx of bleeding per RN report  Goal of Therapy:  Heparin level 0.3-0.7 units/ml Monitor platelets by anticoagulation protocol: Yes   Plan:  Decrease heparin infusion rate to 950 units/hr given that heparin level has been trending up over the past 6 hours. Check heparin level in 8 hours Monitor daily CBC, heparin level, and for s/sx of bleeding  Luisa Hart, PharmD, BCPS Clinical Pharmacist 12/09/2022 7:49 AM   Please refer  to [facility] for pharmacy phone number

## 2022-12-09 NOTE — Progress Notes (Signed)
  Progress Note   Patient: [name] [id number]:[id number] DOB: [birth year] DOA: 12/07/2022     1 DOS: the patient was seen and examined on 12/09/2022 at 2:30PM      Brief hospital course: 77 year old male with chronic hypoxic/hypercapnic respiratory failure on home oxygen, pulmonary artery hypertension, diabetes and hypertension who presented with acute COPD exacerbation also found to have COVID and PE and thrombocytopenia.     Assessment and Plan: * Acute and chronic respiratory failure with hypoxia (HCC) - Continue Rocephin - Continue Duonebs - Continue Steroids - Continue heparin   Acute pulmonary embolism (HCC) - Continue heparin  COVID-19 virus infection - Continue Paxlovid   Elevated troponin I level Likely myocardial injury due to COVID, ischemia ruled out  COPD with acute exacerbation (HCC) - Continue steroids - Continue bronchodilators - Continue antibiotics    Thrombocytopenia (HCC) Platelets worsening. Etiology unclear - Trend platelets - Continue heparin for now cautiously  Pulmonary hypertension (HCC) - Continue Treprostinil  Pressure injury of skin    Type 2 diabetes mellitus (HCC) - Hold metformin and NPH - Continue SS corrections  Hypokalemia Supplemented  Hypomagnesemia Supplemented  Essential hypertension - Hold amlodipine with Paxlovid  Dyslipidemia - Hold lipitor  Hypothyroidism - Continue levothyroxine  Rheumatoid arthritis (HCC) - Hold Actemra - Continue steroids  Pancytopenia Today WBC 3.1K, Hgb 8.2 g/dL and Platelets 43K/ul         Subjective: Patient feeling better but still weak.  No fever, cnofusion, chest pain.     Physical Exam: BP 114/65 (BP Location: Right Arm)   Pulse 68   Temp (!) 97.3 F (36.3 C) (Oral)   Resp 18   Ht 5\' 6"  (1.676 m)   Wt 66.7 kg   SpO2 94%   BMI 23.73 kg/m   Thin elderly adult male, lying in bed, no acute distress RRR, no murmurs, no peripheral edema Respiratory rate seems  normal, no wheezing or rales Abdomen soft [place] palpation Attention normal, affect normal, judgment insight appear normal, generalized weakness, symmetric strength    Data Reviewed: Glucose is elevated Sodium, potassium, creatinine normal CT angiogram of the chest showed PE BNP 1019 CRP 12 Platelets down to 43,000 White blood cell count slightly down to 3.1, no change Hemoglobin 8    Family Communication: Wife b phone    Disposition: Status is: Inpatient         Author: Edwin Dada, MD 12/09/2022 5:33 PM  For on call review [email or web address].

## 2022-12-09 NOTE — Assessment & Plan Note (Addendum)
Platelets >50K today - Stop heparin gtt - Start apixaban

## 2022-12-09 NOTE — Progress Notes (Signed)
ANTICOAGULATION CONSULT NOTE - Follow Up Consult  Pharmacy Consult for Heparin infusion Indication: pulmonary embolus  Allergies  Allergen Reactions   Bactrim [Sulfamethoxazole-Trimethoprim] Shortness Of Breath    Patient states "got short winded"   Spiriva Respimat [Tiotropium Bromide Monohydrate] Rash   Ofev [Nintedanib] Other (See Comments)    Unknown     Patient Measurements: Height: 5\' 6"  (167.6 cm) Weight: 66.7 kg (147 lb 0.8 oz) IBW/kg (Calculated) : 63.8 Heparin Dosing Weight: 66.7 kg  Vital Signs: Temp: 97.3 F (36.3 C) (02/07 1607) Temp Source: Oral (02/07 1607) BP: 114/65 (02/07 1607) Pulse Rate: 68 (02/07 1607)  Labs: Recent Labs    12/07/22 2103 12/08/22 0422 12/08/22 0658 12/08/22 1521 12/09/22 0122 12/09/22 0656 12/09/22 1600  HGB 8.8*  --   --   --  8.2*  --   --   HCT 30.0*  --   --   --  26.6*  --   --   PLT 51*  --   --   --  43*  --   --   HEPARINUNFRC  --   --   --    < > 0.45 0.63 0.54  CREATININE 1.19  --   --   --  1.19  --   --   TROPONINIHS  --  221* 282*  --   --   --   --    < > = values in this interval not displayed.     Estimated Creatinine Clearance: 46.9 mL/min (by C-G formula based on SCr of 1.19 mg/dL).   Medications:  Scheduled:   vitamin C  500 mg Oral Daily   aspirin  81 mg Oral Daily   famotidine  20 mg Oral BID   finasteride  5 mg Oral Daily   fluticasone  2 spray Each Nare Daily   folic acid  099 mcg Oral Daily   insulin aspart  0-15 Units Subcutaneous TID WC   insulin aspart  0-5 Units Subcutaneous QHS   insulin aspart  3 Units Subcutaneous TID WC   levothyroxine  25 mcg Oral Q0600   methylPREDNISolone (SOLU-MEDROL) injection  60 mg Intravenous BID   Followed by   Derrill Memo ON 12/11/2022] predniSONE  50 mg Oral Daily   nirmatrelvir/ritonavir  3 tablet Oral BID   pantoprazole  40 mg Oral Daily   Treprostinil  48 mcg Inhalation 4 times per day   zinc sulfate  220 mg Oral Daily   Infusions:   cefTRIAXone  (ROCEPHIN)  IV 2 g (12/09/22 0829)   heparin 950 Units/hr (12/09/22 8338)    Assessment: 77 yo M admitted for COVID pneumonia and found to have bilateral inferior posterior segmental submassive PE w/ evidence of R heart strain on CT PE from 12/08/22. Pt was not taking any anticoagulation prior to admission. Pharmacy consulted to dose heparin for PE.   Heparin level remains therapeutic once again at 0.54.  Goal of Therapy:  Heparin level 0.3-0.7 units/ml Monitor platelets by anticoagulation protocol: Yes   Plan:  Continue heparin 950 units/h Daily heparin level and CBC  Arrie Senate, PharmD, [place], [facility] Clinical Pharmacist [phone] Please check AMION for all [facility] Pharmacy numbers 12/09/2022

## 2022-12-09 NOTE — Telephone Encounter (Signed)
Updated DME order with wheelchair narrative has been placed for patient. Will close encounter.

## 2022-12-09 NOTE — Assessment & Plan Note (Signed)
-   Hold Actemra, Imuran - Continue steroids

## 2022-12-09 NOTE — Hospital Course (Addendum)
[name] is a 77 y.o. M with RA, ILD on Actemra and Imuran, emphysema and pHTN on Tyvaso and chronic respiratory failure on 4-5L at home, as well as recent COVID complicated by pancytopenia who presented with dyspnea and respiratory distress.      1/5-1/12: Admitted to [facility] for COVID, ABG showed pO2 36 on home 4L admitted to ICU on HHFNC and BiPAP; improved on steroids and discharged on Augmentin  1/21-1/29: Readmitted to [facility] with SOB/fever, this time found to have pancytopenia platelets 50K; treated with cefepime x9 days and discharged again home with [facility] on prednisone   2/5: Readmitted for respiratory distress 2/6: CCM consulted, recommended heparin, steroids 2/7: Echo shows stable cor pulmonale and pHTN 2/8: DVT study shows L gastroc DVT 2/9: Switched to apixaban, weaned to 6L at rest, but still 10L with ambulation 2/10: Still on 12L O2 with ambulation, Pulmonary consulted 12/14/2022: Patient is requiring 5L O2 by [place]. 12/15/2022: The patient is currently requiring 8L. Pulmonary has signed off.

## 2022-12-10 ENCOUNTER — Inpatient Hospital Stay (HOSPITAL_COMMUNITY): Payer: Medicare Other

## 2022-12-10 DIAGNOSIS — J441 Chronic obstructive pulmonary disease with (acute) exacerbation: Secondary | ICD-10-CM | POA: Diagnosis not present

## 2022-12-10 DIAGNOSIS — I2699 Other pulmonary embolism without acute cor pulmonale: Secondary | ICD-10-CM | POA: Diagnosis not present

## 2022-12-10 DIAGNOSIS — J9621 Acute and chronic respiratory failure with hypoxia: Secondary | ICD-10-CM | POA: Diagnosis not present

## 2022-12-10 DIAGNOSIS — U071 COVID-19: Secondary | ICD-10-CM | POA: Diagnosis not present

## 2022-12-10 DIAGNOSIS — D61818 Other pancytopenia: Secondary | ICD-10-CM | POA: Diagnosis present

## 2022-12-10 LAB — COMPREHENSIVE METABOLIC PANEL
ALT: 23 U/L (ref 0–44)
AST: 26 U/L (ref 15–41)
Albumin: 2.4 g/dL — ABNORMAL LOW (ref 3.5–5.0)
Alkaline Phosphatase: 28 U/L — ABNORMAL LOW (ref 38–126)
Anion gap: 8 (ref 5–15)
BUN: 23 mg/dL (ref 8–23)
CO2: 26 mmol/L (ref 22–32)
Calcium: 7.5 mg/dL — ABNORMAL LOW (ref 8.9–10.3)
Chloride: 104 mmol/L (ref 98–111)
Creatinine, Ser: 1.05 mg/dL (ref 0.61–1.24)
GFR, Estimated: >60 mL/min (ref >60)
Glucose, Bld: 72 mg/dL (ref 70–99)
Potassium: 4.9 mmol/L (ref 3.5–5.1)
Sodium: 138 mmol/L (ref 135–145)
Total Bilirubin: 0.6 mg/dL (ref 0.3–1.2)
Total Protein: 4.7 g/dL — ABNORMAL LOW (ref 6.5–8.1)

## 2022-12-10 LAB — CBC WITH DIFFERENTIAL/PLATELET
Abs Immature Granulocytes: 0.06 10*3/uL (ref 0.00–0.07)
Basophils Absolute: 0 10*3/uL (ref 0.0–0.1)
Basophils Relative: 1 %
Eosinophils Absolute: 0 10*3/uL (ref 0.0–0.5)
Eosinophils Relative: 0 %
HCT: 24.2 % — ABNORMAL LOW (ref 39.0–52.0)
Hemoglobin: 7.6 g/dL — ABNORMAL LOW (ref 13.0–17.0)
Immature Granulocytes: 2 %
Lymphocytes Relative: 3 %
Lymphs Abs: 0.1 10*3/uL — ABNORMAL LOW (ref 0.7–4.0)
MCH: 27.8 pg (ref 26.0–34.0)
MCHC: 31.4 g/dL (ref 30.0–36.0)
MCV: 88.6 fL (ref 80.0–100.0)
Monocytes Absolute: 0.2 10*3/uL (ref 0.1–1.0)
Monocytes Relative: 4 %
Neutro Abs: 3.1 10*3/uL (ref 1.7–7.7)
Neutrophils Relative %: 90 %
Platelets: 49 10*3/uL — ABNORMAL LOW (ref 150–400)
RBC: 2.73 MIL/uL — ABNORMAL LOW (ref 4.22–5.81)
RDW: 22 % — ABNORMAL HIGH (ref 11.5–15.5)
WBC: 3.5 10*3/uL — ABNORMAL LOW (ref 4.0–10.5)
nRBC: 1.7 % — ABNORMAL HIGH (ref 0.0–0.2)

## 2022-12-10 LAB — C-REACTIVE PROTEIN: CRP: 6.9 mg/dL — ABNORMAL HIGH (ref <1.0)

## 2022-12-10 LAB — GLUCOSE, CAPILLARY
Glucose-Capillary: 129 mg/dL — ABNORMAL HIGH (ref 70–99)
Glucose-Capillary: 189 mg/dL — ABNORMAL HIGH (ref 70–99)
Glucose-Capillary: 99 mg/dL (ref 70–99)
Glucose-Capillary: 126 mg/dL — ABNORMAL HIGH (ref 70–99)

## 2022-12-10 LAB — HEPARIN LEVEL (UNFRACTIONATED): Heparin Unfractionated: 0.52 IU/mL (ref 0.30–0.70)

## 2022-12-10 NOTE — Inpatient Diabetes Management (Signed)
Inpatient Diabetes Program Recommendations  AACE/ADA: New Consensus Statement on Inpatient Glycemic Control (2015)  Target Ranges:  Prepandial:   less than 140 mg/dL      Peak postprandial:   less than 180 mg/dL (1-2 hours)      Critically ill patients:  140 - 180 mg/dL   Lab Results  Component Value Date   GLUCAP 129 (H) 12/10/2022   HGBA1C 7.1 (H) 11/14/2020    Latest Reference Range & Units 12/09/22 06:17 12/09/22 12:29 12/09/22 16:06 12/09/22 16:24 12/09/22 21:08 12/10/22 06:17  Glucose-Capillary 70 - 99 mg/dL 193 (H) 320 (H) 185 (H) 177 (H) 91 129 (H)  (H): Data is abnormally high  Diabetes history: DM2 Outpatient Diabetes medications: Novolin N 2-3 units qd for elevated CBGs (?), Metformin 500 mg bid Current orders for Inpatient glycemic control: Novolog 3 units tid meal coverage, Novolog 0-15 units tid, 0-5 units hs, Solumedrol 60 mg q 12 hrs., then 2/9 change to Prednisone 50 qd  Inpatient Diabetes Program Recommendations:   Please consider: -Decrease Novolog correction to 0-9 units tid, 0-5 units hs  Thank you, Bethena Roys E. Freddi Schrager, RN, MSN, CDE  Diabetes Coordinator Inpatient Glycemic Control Team Team Pager [phone] (8am-5pm) 12/10/2022 9:57 AM

## 2022-12-10 NOTE — Progress Notes (Signed)
  Progress Note   Patient: [name] WFU:[id number] DOB: 1946-02-16 DOA: 12/07/2022     2 DOS: the patient was seen and examined on 12/10/2022 at 10:54AM      Brief hospital course: [name] is a 77 y.o. M with RA, ILD on Actemra and Imuran, emphysema and pHTN on Tyvaso and chronic respiratory failure on 4-5L at home, as well as recent COVID complicated by pancytopenia who presented with dyspnea and respiratory distress.      1/5-1/12: Admitted to [facility] for COVID, ABG showed pO2 36 on home 4L admitted to ICU on [place] and BiPAP; improved on steroids and discharged on Augmentin  1/21-1/29: Readmitted to [facility] with SOB/fever, this time found to have pancytopenia platelets 50K; treated with cefepime x9 days and discharged again home with [facility] on prednisone   2/5: Readmitted for respiratory distress 2/6: CCM consulted, recommended heparin, steroids 2/7: Echo shows stable cor pulmonale and pHTN 2/8: DVT study shows L gastroc DVT     Assessment and Plan: * Acute and chronic respiratory failure with hypoxia (HCC) - Continue steroids - Continue bronchodilators - Continue heparin - Stop antibiotics - Stop Paxlovid     Acute pulmonary embolism (HCC) - Continue heparin gtt until platelets >50K then transition to oral DOAC without load  COVID-19 virus infection Tested positive in Nov 06, 2022.   - Stop Paxlovid  - Discontinue isolation precautions  Elevated troponin I level Likely myocardial injury due to COVID, ischemia ruled out  COPD with acute exacerbation (HCC) Still wheezing - Continue steroids - Continue bronchodilators    Thrombocytopenia (HCC) See below  Pancytopenia (HCC) WBC <4, Hgb <8, and platelets <50.  Suspect bone marrow suppression from COVID - Check smear, retics, B12, folate  Pulmonary hypertension (HCC) - Continue Treprostinil  Pressure injury of skin  Sacrum, stage II, present on admission  Type 2 diabetes mellitus (HCC) Glucose  controlled - Hold metformin and NPH - Continue SS corrections  Hypokalemia Supplemented  Hypomagnesemia Supplemented  Essential hypertension - Hold amlodipine given soft BP  Dyslipidemia - Hold lipitor  Hypothyroidism - Continue levothyroxine  Rheumatoid arthritis (HCC) - Hold Actemra, Imuran - Continue steroids          Subjective: No new complaints, no bleeding, no confusion, breathing is essentially unchanged from yesterday, close to baseline.    Physical Exam: BP 125/79   Pulse 78   Temp (!) 97 F (36.1 C) (Axillary)   Resp 20   Ht 5\' 6"  (1.676 m)   Wt 63.2 kg   SpO2 99%   BMI 22.49 kg/m   Adult male, lying in bed, no acute distress RRR, no murmurs, no peripheral edema Respiratory rate normal, bilateral wheezing Attention normal, affect appropriate, judgment Syprine normal        Data Reviewed: CBC shows hemoglobin down to 7.6, platelets up to 49,000, CRP down to 6  Family Communication: Wife by phone    Disposition: Status is: Inpatient         Author: Edwin Dada, MD 12/10/2022 4:52 PM  For on call review [email or web address].

## 2022-12-10 NOTE — Progress Notes (Signed)
Bilateral lower extremity venous duplex has been completed. Preliminary results can be found in CV Proc through chart review.  Results were given to [place].  12/10/22 1:10 PM [name] RVT

## 2022-12-10 NOTE — Progress Notes (Signed)
ANTICOAGULATION CONSULT NOTE - Follow Up Consult  Pharmacy Consult for Heparin infusion Indication: pulmonary embolus  Allergies  Allergen Reactions   Bactrim [Sulfamethoxazole-Trimethoprim] Shortness Of Breath    Patient states "got short winded"   Spiriva Respimat [Tiotropium Bromide Monohydrate] Rash   Ofev [Nintedanib] Other (See Comments)    Unknown     Patient Measurements: Height: 5\' 6"  (167.6 cm) Weight: 63.2 kg (139 lb 5.3 oz) IBW/kg (Calculated) : 63.8 Heparin Dosing Weight: 66.7 kg  Vital Signs: Temp: 97.7 F (36.5 C) (02/08 0841) Temp Source: Oral (02/08 0841) BP: 114/65 (02/08 0841) Pulse Rate: 78 (02/08 0841)  Labs: Recent Labs    12/07/22 2103 12/08/22 0422 12/08/22 5643 12/08/22 1521 12/09/22 0122 12/09/22 0656 12/09/22 1600 12/10/22 0049  HGB 8.8*  --   --   --  8.2*  --   --  7.6*  HCT 30.0*  --   --   --  26.6*  --   --  24.2*  PLT 51*  --   --   --  43*  --   --  49*  HEPARINUNFRC  --   --   --    < > 0.45 0.63 0.54 0.52  CREATININE 1.19  --   --   --  1.19  --   --  1.05  TROPONINIHS  --  221* 282*  --   --   --   --   --    < > = values in this interval not displayed.     Estimated Creatinine Clearance: 52.7 mL/min (by C-G formula based on SCr of 1.05 mg/dL).   Medications:  Scheduled:   vitamin C  500 mg Oral Daily   aspirin  81 mg Oral Daily   famotidine  20 mg Oral BID   finasteride  5 mg Oral Daily   fluticasone  2 spray Each Nare Daily   folic acid  329 mcg Oral Daily   insulin aspart  0-15 Units Subcutaneous TID WC   insulin aspart  0-5 Units Subcutaneous QHS   insulin aspart  3 Units Subcutaneous TID WC   levothyroxine  25 mcg Oral Q0600   methylPREDNISolone (SOLU-MEDROL) injection  60 mg Intravenous BID   Followed by   Derrill Memo ON 12/11/2022] predniSONE  50 mg Oral Daily   nirmatrelvir/ritonavir  3 tablet Oral BID   pantoprazole  40 mg Oral Daily   Treprostinil  48 mcg Inhalation 4 times per day   zinc sulfate  220 mg Oral  Daily   Infusions:   cefTRIAXone (ROCEPHIN)  IV 2 g (12/10/22 0856)   heparin 950 Units/hr (12/10/22 0553)    Assessment: 77 yo M admitted for COVID pneumonia and found to have bilateral inferior posterior segmental submassive PE w/ evidence of R heart strain on CT PE from 12/08/22. Pt was not taking any anticoagulation prior to admission. Pharmacy consulted to dose heparin for PE.   Heparin level 0.52, therapeutic Current heparin infusion rate: 950 units/hr  Hgb gradually trending down to 7.6 Plt low, but stable at 49 Plt 51 on admission prior to heparin initiation. No s/sx of bleeding per RN report  Goal of Therapy:  Heparin level 0.3-0.7 units/ml Monitor platelets by anticoagulation protocol: Yes   Plan:  Continue heparin infusion at 950 units/hr Monitor daily CBC, heparin level, and for s/sx of bleeding F/u plan to transition to oral anticoagulation  Luisa Hart, PharmD, BCPS Clinical Pharmacist 12/10/2022 10:35 AM   Please refer to [facility]  for pharmacy phone number

## 2022-12-10 NOTE — Assessment & Plan Note (Addendum)
This first was noted mid Jan during admission at [facility] Here, again WBC <4, Hgb <8, and platelets <50.  Suspect bone marrow suppression from COVID, in setting of Imuran  B12, folate, smear unremarkable.  Retics hypoproliferative. - Hold Imuran now and at discharge - Follow up with Hematology and Rheumatology and Pulmonology on discharge.

## 2022-12-10 NOTE — Evaluation (Signed)
Physical Therapy Evaluation Patient Details Name: [name] MRN: [id number] DOB: [birth year] Today's Date: 12/10/2022  History of Present Illness  77 year old male presented to ED 2/6 with c/o shortness of breath and SpO2 dropping to the 70s on his 4L O2 baseline. CT angiogram of chest which showed bilateral peripheral pulmonary emboli, with right heart strain. Admitted for treatment of COPD exacerbation with hypoxia. PMH: COPD, type 2 diabetes mellitus, emphysema, hypertension, RA, osteoarthritis and hypothyroidism, pHTN on Tyvaso and chronic respiratory failure on 4-5L at home, diagnosis of COVID in January   Clinical Impression  PTA pt living with his wife in a single story home with ramped entrance. Pt reports ambulation household distances with RW. Pt able to dress but requires assist from wife for bathing, cooking and cleaning. Pt is currently limited in safe mobility by increased oxygen demand, in presence of generalized weakness, increased work of breathing and decreased endurance. Pt is currently mod I for bed mobility, and min A for transfers and ambulation with RW. Pt's SpO2 on 6L O2 during 12 feet of ambulation dropped into 70s SpO2 and required increase in O2 to to 9L O2 via HFNC. PT recommending resumption of HHPT with current agency and wheelchair for improved long distance mobility. PT will continue to follow acutely     Recommendations for follow up therapy are one component of a multi-disciplinary discharge planning process, led by the attending physician.  Recommendations may be updated based on patient status, additional functional criteria and insurance authorization.  Follow Up Recommendations Home health PT (resume with current agency)      Assistance Recommended at Discharge Intermittent Supervision/Assistance  Patient can return home with the following  A little help with walking and/or transfers;A little help with bathing/dressing/bathroom;Assistance with  cooking/housework;Direct supervision/assist for medications management;Direct supervision/assist for financial management;Assist for transportation;Help with stairs or ramp for entrance    Equipment Recommendations Wheelchair (measurements PT);Wheelchair cushion (measurements PT)  Recommendations for Other Services  OT consult    Functional Status Assessment Patient has had a recent decline in their functional status and demonstrates the ability to make significant improvements in function in a reasonable and predictable amount of time.     Precautions / Restrictions Precautions Precautions: Fall Restrictions Weight Bearing Restrictions: No      Mobility  Bed Mobility Overal bed mobility: Modified Independent             General bed mobility comments: uses bed rail to pull to the EoB and bring trunk to upright,    Transfers Overall transfer level: Needs assistance Equipment used: Rolling walker (2 wheels) Transfers: Sit to/from Stand, Bed to chair/wheelchair/BSC Sit to Stand: Min guard   Step pivot transfers: Min assist       General transfer comment: min guard for power up and steadying, pt has incontinence of bowels with coming to standing, and requires min A for steadying with stepping to [facility]    Ambulation/Gait Ambulation/Gait assistance: Min assist Gait Distance (Feet): 12 Feet Assistive device: Rolling walker (2 wheels) Gait Pattern/deviations: Step-through pattern, Decreased step length - right, Decreased step length - left, Trunk flexed Gait velocity: slowed Gait velocity interpretation: <1.31 ft/sec, indicative of household ambulator   General Gait Details: min A for steadying and managment of lines and leads to ambulate from [facility] to recliner, pt with 4/4 DoE with ambulation to recliner         Balance Overall balance assessment: Needs assistance Sitting-balance support: Feet supported, No upper extremity supported Sitting balance-Leahy Scale: Good  Standing balance support: Single extremity supported, Bilateral upper extremity supported, During functional activity, Reliant on assistive device for balance Standing balance-Leahy Scale: Poor Standing balance comment: requires at least single UE support                             Pertinent Vitals/Pain Pain Assessment Pain Assessment: No/denies pain    Home Living Family/patient expects to be discharged to:: Private residence Living Arrangements: Spouse/significant other Available Help at Discharge: Family;Available 24 hours/day Type of Home: Mobile home Home Access: Ramped entrance       Home Layout: One level Home Equipment: BSC/3in1;Rolling Walker (2 wheels);Cane - quad      Prior Function Prior Level of Function : Needs assist             Mobility Comments: pt reports walking household distances with RW, no real community ambulation, ADLs Comments: able to dress, wifer assists with bathing, and does cooking and cleaning     Hand Dominance   Dominant Hand: Right    Extremity/Trunk Assessment   Upper Extremity Assessment Upper Extremity Assessment: Overall WFL for tasks assessed    Lower Extremity Assessment Lower Extremity Assessment: Overall WFL for tasks assessed    Cervical / Trunk Assessment Cervical / Trunk Assessment: Kyphotic  Communication   Communication: No difficulties  Cognition Arousal/Alertness: Awake/alert Behavior During Therapy: WFL for tasks assessed/performed Overall Cognitive Status: Within Functional Limits for tasks assessed                                          General Comments General comments (skin integrity, edema, etc.): Pt on 5L O2 via HFNC on entry with SpO2 94%O2, pt transfers to [facility] on 6L O2 and maintains SpO2 89%O2, with ambulation to recliner on 6L SpO on 76%O2, increased to 9L O2 via HFNC, and constant cues for purse lip breathing, and inhale through nose, pt preferrence for mouth  breathing rebonded to 89%O2        Assessment/Plan    PT Assessment Patient needs continued PT services  PT Problem List Decreased strength;Decreased activity tolerance;Decreased balance;Decreased mobility;Decreased coordination;Cardiopulmonary status limiting activity       PT Treatment Interventions DME instruction;Gait training;Stair training    PT Goals (Current goals can be found in the Care Plan section)  Acute Rehab PT Goals Patient Stated Goal: breath better PT Goal Formulation: With patient Time For Goal Achievement: 12/24/22 Potential to Achieve Goals: Good    Frequency Min 3X/week        AM-PAC PT "6 Clicks" Mobility  Outcome Measure Help needed turning from your back to your side while in a flat bed without using bedrails?: None Help needed moving from lying on your back to sitting on the side of a flat bed without using bedrails?: None Help needed moving to and from a bed to a chair (including a wheelchair)?: A Little Help needed standing up from a chair using your arms (e.g., wheelchair or bedside chair)?: A Little Help needed to walk in hospital room?: A Little Help needed climbing 3-5 steps with a railing? : A Lot 6 Click Score: 19    End of Session Equipment Utilized During Treatment: Oxygen;Gait belt Activity Tolerance: Treatment limited secondary to medical complications (Comment) (increased O2 demand) Patient left: in chair;with call bell/phone within reach;with chair alarm set Nurse Communication: Mobility  status;Other (comment) (need to check on O2 saturation) PT Visit Diagnosis: Unsteadiness on feet (R26.81);Muscle weakness (generalized) (M62.81);Difficulty in walking, not elsewhere classified (R26.2)    Time: 9767-3419 PT Time Calculation (min) (ACUTE ONLY): 42 min   Charges:   PT Evaluation $PT Eval Moderate Complexity: 1 Mod PT Treatments $Gait Training: 8-22 mins $Therapeutic Activity: 8-22 mins        Adely Facer B. Migdalia Dk PT,  DPT Acute Rehabilitation Services Please use secure chat or  Call Office [phone]   [place] 12/10/2022, 2:58 PM

## 2022-12-11 ENCOUNTER — Other Ambulatory Visit (HOSPITAL_COMMUNITY): Payer: Self-pay

## 2022-12-11 DIAGNOSIS — Z7189 Other specified counseling: Secondary | ICD-10-CM | POA: Diagnosis not present

## 2022-12-11 DIAGNOSIS — Z515 Encounter for palliative care: Secondary | ICD-10-CM

## 2022-12-11 DIAGNOSIS — Z66 Do not resuscitate: Secondary | ICD-10-CM | POA: Diagnosis not present

## 2022-12-11 DIAGNOSIS — J9621 Acute and chronic respiratory failure with hypoxia: Secondary | ICD-10-CM | POA: Diagnosis not present

## 2022-12-11 DIAGNOSIS — J441 Chronic obstructive pulmonary disease with (acute) exacerbation: Secondary | ICD-10-CM | POA: Diagnosis not present

## 2022-12-11 DIAGNOSIS — I2699 Other pulmonary embolism without acute cor pulmonale: Secondary | ICD-10-CM | POA: Diagnosis not present

## 2022-12-11 DIAGNOSIS — U071 COVID-19: Secondary | ICD-10-CM | POA: Diagnosis not present

## 2022-12-11 DIAGNOSIS — J181 Lobar pneumonia, unspecified organism: Secondary | ICD-10-CM | POA: Diagnosis present

## 2022-12-11 LAB — RETICULOCYTES
Immature Retic Fract: 15.6 % (ref 2.3–15.9)
RBC.: 2.94 MIL/uL — ABNORMAL LOW (ref 4.22–5.81)
Retic Count, Absolute: 35.3 10*3/uL (ref 19.0–186.0)
Retic Ct Pct: 1.2 % (ref 0.4–3.1)

## 2022-12-11 LAB — CBC WITH DIFFERENTIAL/PLATELET
Abs Immature Granulocytes: 0.05 10*3/uL (ref 0.00–0.07)
Basophils Absolute: 0 10*3/uL (ref 0.0–0.1)
Basophils Relative: 1 %
Eosinophils Absolute: 0 10*3/uL (ref 0.0–0.5)
Eosinophils Relative: 0 %
HCT: 26.2 % — ABNORMAL LOW (ref 39.0–52.0)
Hemoglobin: 8.2 g/dL — ABNORMAL LOW (ref 13.0–17.0)
Immature Granulocytes: 2 %
Lymphocytes Relative: 3 %
Lymphs Abs: 0.1 10*3/uL — ABNORMAL LOW (ref 0.7–4.0)
MCH: 27.4 pg (ref 26.0–34.0)
MCHC: 31.3 g/dL (ref 30.0–36.0)
MCV: 87.6 fL (ref 80.0–100.0)
Monocytes Absolute: 0.1 10*3/uL (ref 0.1–1.0)
Monocytes Relative: 5 %
Neutro Abs: 2.1 10*3/uL (ref 1.7–7.7)
Neutrophils Relative %: 89 %
Platelets: 55 10*3/uL — ABNORMAL LOW (ref 150–400)
RBC: 2.99 MIL/uL — ABNORMAL LOW (ref 4.22–5.81)
RDW: 22 % — ABNORMAL HIGH (ref 11.5–15.5)
WBC: 2.3 10*3/uL — ABNORMAL LOW (ref 4.0–10.5)
nRBC: 3.4 % — ABNORMAL HIGH (ref 0.0–0.2)

## 2022-12-11 LAB — HEPARIN LEVEL (UNFRACTIONATED): Heparin Unfractionated: 0.43 IU/mL (ref 0.30–0.70)

## 2022-12-11 LAB — GLUCOSE, CAPILLARY
Glucose-Capillary: 214 mg/dL — ABNORMAL HIGH (ref 70–99)
Glucose-Capillary: 207 mg/dL — ABNORMAL HIGH (ref 70–99)
Glucose-Capillary: 196 mg/dL — ABNORMAL HIGH (ref 70–99)
Glucose-Capillary: 194 mg/dL — ABNORMAL HIGH (ref 70–99)

## 2022-12-11 LAB — TECHNOLOGIST SMEAR REVIEW

## 2022-12-11 LAB — FOLATE: Folate: 14.6 ng/mL

## 2022-12-11 LAB — VITAMIN B12: Vitamin B-12: 771 pg/mL (ref 180–914)

## 2022-12-11 MED ORDER — FUROSEMIDE 10 MG/ML IJ SOLN
20.0000 mg | Freq: Once | INTRAMUSCULAR | Status: AC
  Administered 2022-12-11: 20 mg via INTRAVENOUS
  Filled 2022-12-11: qty 2

## 2022-12-11 MED ORDER — APIXABAN 5 MG PO TABS
5.0000 mg | ORAL_TABLET | Freq: Two times a day (BID) | ORAL | Status: AC
  Administered 2022-12-11 – 2022-12-18 (×16): 5 mg via ORAL
  Filled 2022-12-11 (×16): qty 1

## 2022-12-11 MED ORDER — APIXABAN 5 MG PO TABS: 5.0000 mg | ORAL_TABLET | Freq: Two times a day (BID) | ORAL | Status: DC

## 2022-12-11 MED ORDER — SODIUM CHLORIDE 0.9 % IV SOLN
2.0000 g | INTRAVENOUS | Status: AC
  Administered 2022-12-11 – 2022-12-12 (×2): 2 g via INTRAVENOUS
  Filled 2022-12-11 (×2): qty 20

## 2022-12-11 NOTE — Progress Notes (Signed)
Progress Note   Patient: [name] [id number] DOB: 08-14-1946 DOA: 12/07/2022     3 DOS: the patient was seen and examined on 12/11/2022 at 11:55AM      Brief hospital course: [name] is a 77 y.o. M with RA, ILD on Actemra and Imuran, emphysema and pHTN on Tyvaso and chronic respiratory failure on 4-5L at home, as well as recent COVID complicated by pancytopenia who presented with dyspnea and respiratory distress.      1/5-1/12: Admitted to [facility] for COVID, ABG showed pO2 36 on home 4L admitted to ICU on [place] and BiPAP; improved on steroids and discharged on Augmentin  1/21-1/29: Readmitted to [facility] with SOB/fever, this time found to have pancytopenia platelets 50K; treated with cefepime x9 days and discharged again home with [facility] on prednisone   2/5: Readmitted for respiratory distress 2/6: CCM consulted, recommended heparin, steroids 2/7: Echo shows stable cor pulmonale and pHTN 2/8: DVT study shows L gastroc DVT 2/9: Switched to apixaban, weaned to 6L at rest, but still 10L with ambulation     Assessment and Plan: * Acute and chronic respiratory failure with hypoxia ([place]) Presented with hypoxia requiring BiPAP and nonrebreather.  Probably a combination of PE, COPD exacerbation, and severe advancing pulmonary hypertension.  COVID was >1 month ago, no indication for further antivirals or COVID specific treatments.     - Continue steroids and bronchodilators    Acute pulmonary embolism (HCC) Platelets >50K today - Stop heparin gtt - Start apixaban  Pulmonary hypertension ([place]) Echo shows stable evidence of severe pulmonary hypertension and TR, no change.  Evaluated by Pulmonology during this hospitalization, no further optimization possible. - Continue Treprostinil - IV Lasix x1 today  Pancytopenia ([place]) This first was noted mid Jan during admission at [facility] Here, again WBC <4, Hgb <8, and platelets <50.  Suspect bone marrow suppression from  COVID  B12, folate, smear unremarkable.  Retics hypoproliferative. - Hold Imuran now and at discharge - Follow up with Hematology and Rheumatology and Pulmonology  COPD with acute exacerbation ([place]) Still wheezing - Continue steroids - Continue bronchodilators    Thrombocytopenia ([place]) See below  Lobar pneumonia ([place]) CT chest showed RIGHT basilar basilar dependent consolidation suspicious for pneumonia.   - Continue Rocephin, day 4 of 5 - Continue aggressive pulmonary toilet  Pressure injury of skin  Sacrum, stage II, present on admission  Type 2 diabetes mellitus (HCC) Glucose controlled - Hold metformin and NPH - Continue SS corrections  Elevated troponin I level Likely myocardial injury due to COVID, ischemia ruled out  Hypokalemia Supplemented  Hypomagnesemia Supplemented  Essential hypertension - Hold amlodipine given soft BP  Dyslipidemia - Hold lipitor  COVID-19 virus infection Tested positive in Nov 06, 2022.   - Stop Paxlovid  - Discontinue isolation precautions  Hypothyroidism - Continue levothyroxine  Rheumatoid arthritis ([place]) - Hold Actemra, Imuran - Continue steroids          Subjective: Short of breath.  No clinical bleeding.  Very out of breath with minimal exertion.  Switch to oral anticoagulation today.     Physical Exam: BP 138/75   Pulse 92   Temp 98 F (36.7 C)   Resp 18   Ht 5' 6"$  (1.676 m)   Wt 65.3 kg   SpO2 91%   BMI 23.24 kg/m   Cachectic adult male, sitting up in recliner, weak and tired Tachycardic, systolic tricuspid regurgitation murmur noted, no peripheral edema Respiratory rate seems increased, lung sounds diminished,  no rales or wheezes appreciated Abdomen soft without tenderness palpation Attention normal, affect appropriate, judgment insight appear normal     Data Reviewed: Folate, b12 normal, smear normal.  Platelets up slightly, retics low   Family Communication:      Disposition: Status is: Inpatient Will d/c home, but will need likely increased O2 support at home        Author: Edwin Dada, MD 12/11/2022 4:05 PM  For on call review [email or web address].

## 2022-12-11 NOTE — Assessment & Plan Note (Addendum)
CT chest showed RIGHT basilar basilar dependent consolidation suspicious for pneumonia.   - Continue Rocephin, day 6 - Continue aggressive pulmonary toilet

## 2022-12-11 NOTE — Progress Notes (Signed)
Mobility Specialist - Progress Note   12/11/22 1520  Mobility  Activity Ambulated with assistance in hallway  Level of Assistance Contact guard assist, steadying assist  Assistive Device Front wheel walker  Distance Ambulated (ft) 60 ft  Activity Response Tolerated well  Mobility Referral Yes  $Mobility charge 1 Mobility    Pt received in bed agreeable to mobility. Took standing break x1 to encourage PLB. Ambulated on 8L O2. Left in bed w/ RN at bed side.   Pre-mobility: SpO2 97% During mobility: SpO2 85% Post-mobility: SpO2 91%  [place] Specialist Please contact via Solicitor or Rehab office at [phone]

## 2022-12-11 NOTE — TOC Benefit Eligibility Note (Signed)
Patient Advocate Encounter  Insurance verification completed.    The patient is currently admitted and upon discharge could be taking Eliquis 5 mg.  The current 30 day co-pay is $0.00.   The patient is insured through [employer] Medicare Part D   Arianni Gallego, CPHT Pharmacy Patient Advocate Specialist [facility] Patient Advocate Team Direct Number: [phone]  Fax: [phone]       

## 2022-12-11 NOTE — Care Management Important Message (Signed)
Important Message  Patient Details  Name: Rhet Bernosky MRN: [id number] Date of Birth: August 04, 1946   Medicare Important Message Given:  Yes     Shelda Altes 12/11/2022, 8:43 AM

## 2022-12-11 NOTE — Discharge Instructions (Addendum)
   ____________________________________________________________________________________________________________________________ Information on my medicine - ELIQUIS (apixaban)  This medication education was reviewed with me or my healthcare representative as part of my discharge preparation.    Why was Eliquis prescribed for you? Eliquis was prescribed to treat blood clots that may have been found in the veins of your legs (deep vein thrombosis) or in your lungs (pulmonary embolism) and to reduce the risk of them occurring again.  What do You need to know about Eliquis ? The dose ONE 5 mg tablet taken TWICE daily.  Eliquis may be taken with or without food.   Try to take the dose about the same time in the morning and in the evening. If you have difficulty swallowing the tablet whole please discuss with your pharmacist how to take the medication safely.  Take Eliquis exactly as prescribed and DO NOT stop taking Eliquis without talking to the doctor who prescribed the medication.  Stopping may increase your risk of developing a new blood clot.  Refill your prescription before you run out.  After discharge, you should have regular check-up appointments with your healthcare provider that is prescribing your Eliquis.    What do you do if you miss a dose? If a dose of ELIQUIS is not taken at the scheduled time, take it as soon as possible on the same day and twice-daily administration should be resumed. The dose should not be doubled to make up for a missed dose.  Important Safety Information A possible side effect of Eliquis is bleeding. You should call your healthcare provider right away if you experience any of the following: Bleeding from an injury or your nose that does not stop. Unusual colored urine (red or dark brown) or unusual colored stools (red or black). Unusual bruising for unknown reasons. A serious fall or if you hit your head (even if there is no bleeding).  Some  medicines may interact with Eliquis and might increase your risk of bleeding or clotting while on Eliquis. To help avoid this, consult your healthcare provider or pharmacist prior to using any new prescription or non-prescription medications, including herbals, vitamins, non-steroidal anti-inflammatory drugs (NSAIDs) and supplements.  This website has more information on Eliquis (apixaban): http://www.eliquis.com/eliquis/home

## 2022-12-11 NOTE — Progress Notes (Signed)
Nurse requested Mobility Specialist to perform oxygen saturation test with pt which includes removing pt from oxygen both at rest and while ambulating.  Below are the results from that testing.     Patient Saturations on 8L O2 at Rest = spO2 97%  Patient Saturations on 8L O2 while Ambulating = sp02 85% .  Rested and performed pursed lip breathing for 1 minute with sp02 at 87%.  At end of testing pt left in room on 8 Liters of oxygen.  Reported results to nurse.   [place] Specialist Please contact via SecureChat or Rehab office at [phone]

## 2022-12-11 NOTE — Consult Note (Signed)
Consultation Note Date: 12/11/2022   Patient Name: [name]  DOB: 1946/05/23  MRN: [id number]  Age / Sex: 77 y.o., male  PCP: Merrilee Seashore, MD Referring Physician: Edwin Dada, *  Reason for Consultation: Establishing goals of care  HPI/Patient Profile: 77 y.o. male  with past medical history of RA, ILD on Actemra and Imuran, emphysema and pHTN on Tyvaso and chronic respiratory failure on 4-5L at home, as well as recent COVID complicated by pancytopenia admitted on 12/07/2022 with dyspnea and respiratory distress.  Patient with frequent hospitalizations recently.  Patient currently being treated for acute on chronic respiratory failure, COPD exacerbation, and acute pulmonary embolism.  PMT consulted to discuss goals of care.  Clinical Assessment and Goals of Care: I have reviewed medical records including EPIC notes, labs and imaging, assessed the patient and then met with patient to discuss diagnosis prognosis, GOC, EOL wishes, disposition and options.  No family at bedside.  Interaction somewhat limited due to patient's shortness of breath.  He tells me he just got up and walked so he is more short of breath at this time.  I introduced Palliative Medicine as specialized medical care for people living with serious illness. It focuses on providing relief from the symptoms and stress of a serious illness. The goal is to improve quality of life for both the patient and the family.  Patient tells me he has been declining at home and function has been limited due to his shortness of breath.  He tells me he has been able to maintain a good appetite.  He tells me of frequent hospitalizations recently.   We discussed patient's current illness and what it means in the larger context of patient's on-going co-morbidities.  Natural disease trajectory and expectations at EOL were discussed.  We discussed irreversible nature of his  disease.  I attempted to elicit values and goals of care important to the patient.  Patient tells me what he wants is just to live longer.  The difference between aggressive medical intervention and comfort care was considered in light of the patient's goals of care.   We discussed that at some point he may not want to continue to pursue aggressive medical care including repeat hospitalizations.  We discussed that whenever he feels like current medical treatment is not contributing to his quality of life he may want to consider focusing on his comfort.  We discussed the idea of hospice support at home.  He tells me he is not interested in this.  Discussed with patient the importance of continued conversation with family and the medical providers regarding overall plan of care and treatment options, ensuring decisions are within the context of the patients values and GOCs.    Patient shares if he were ever unable to make medical decisions for himself he would want his wife [name] to do so.  Attempted to call for [place], no answer.  Questions and concerns were addressed. The family was encouraged to call with questions or concerns.  Primary Decision Maker PATIENT    SUMMARY OF RECOMMENDATIONS   Patient would like to continue current medical care He reports his goals are to live longer [name] not interested in hospice support at this time  Code Status/Advance Care Planning: DNR     Primary Diagnoses: Present on Admission:  Acute and chronic respiratory failure with hypoxia ([place])  Rheumatoid arthritis ([place])  Hypothyroidism   I have reviewed the medical record, interviewed the patient and family, and examined the  patient. The following aspects are pertinent.  Past Medical History:  Diagnosis Date   Arthritis    COPD (chronic obstructive pulmonary disease) ([place])    Diabetes mellitus    Hypertension    Pneumonia    Thyroid disease    Social History   Socioeconomic History    Marital status: Married    Spouse name: Fleta   Number of children: Not on file   Years of education: Not on file   Highest education level: Not on file  Occupational History   Occupation: retired Oncologist at [employer] Use   Smoking status: Former    Packs/day: 0.50    Years: 10.00    Total pack years: 5.00    Types: Cigarettes    Quit date: 12/03/1984    Years since quitting: 38.0   Smokeless tobacco: Never   Tobacco comments:    Quit 40 years ago.   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 He reports he is independent with his care needs and transportation to medical appointment. He reports if needed his wife will assist. He reports staying home and likes watching tv.    Social Determinants of Health   Financial Resource Strain: Low Risk  (06/01/2019)   Overall Financial Resource Strain (CARDIA)    Difficulty of Paying Living Expenses: Not hard at all  Food Insecurity: No Food Insecurity (06/01/2019)   Hunger Vital Sign    Worried About Running Out of Food in the Last Year: Never true    Ran Out of Food in the Last Year: Never true  Transportation Needs: Not on file  Physical Activity: Inactive (06/01/2019)   Exercise Vital Sign    Days of Exercise per Week: 0 days    Minutes of Exercise per Session: 0 min  Stress: No Stress Concern Present (06/01/2019)   [place]    Feeling of Stress : Not at all  Social Connections: Not on file   Family History  Problem Relation Age of Onset   Diabetes Mother    Stroke Mother    Scheduled Meds:  apixaban  5 mg Oral BID   vitamin C  500 mg Oral Daily   famotidine  20 mg Oral BID   finasteride  5 mg Oral Daily    fluticasone  2 spray Each Nare Daily   folic acid  [id number] mcg Oral Daily   insulin aspart  0-15 Units Subcutaneous TID WC   insulin aspart  0-5 Units Subcutaneous QHS   insulin aspart  3 Units Subcutaneous TID WC   levothyroxine  25 mcg Oral Q0600   pantoprazole  40 mg Oral Daily   predniSONE  50 mg Oral Daily   Treprostinil  48 mcg Inhalation 4 times per day   zinc sulfate  220 mg Oral Daily   Continuous Infusions: PRN Meds:.acetaminophen, chlorpheniramine-HYDROcodone, guaiFENesin-dextromethorphan, ipratropium-albuterol, magnesium hydroxide, ondansetron **OR** ondansetron (ZOFRAN) IV, traZODone Allergies  Allergen Reactions   Bactrim [Sulfamethoxazole-Trimethoprim] Shortness Of Breath    Patient states "got short winded"   Spiriva Respimat [Tiotropium Bromide Monohydrate] Rash   Ofev [Nintedanib] Other (See Comments)    Unknown  Review of Systems  Constitutional:  Positive for activity change.  Respiratory:  Positive for shortness of breath.     Physical Exam Constitutional:      General: [name] not in acute distress.    Appearance: [name] ill-appearing.  Pulmonary:     Effort: Tachypnea present.  Skin:    General: Skin is warm and dry.  Neurological:     Mental Status: [name] alert and oriented to person, place, and time.     Vital Signs: BP 138/75   Pulse 92   Temp 98 F (36.7 C)   Resp 18   Ht 5' 6"$  (1.676 m)   Wt 65.3 kg   SpO2 91%   BMI 23.24 kg/m  Pain Scale: 0-10   Pain Score: 0-No pain   SpO2: SpO2: 91 % O2 Device:SpO2: 91 % O2 Flow Rate: .O2 Flow Rate (L/min): 6 L/min  IO: Intake/output summary:  Intake/Output Summary (Last 24 hours) at 12/11/2022 1541 Last data filed at 12/11/2022 1534 Gross per 24 hour  Intake 417 ml  Output 900 ml  Net -483 ml    LBM: Last BM Date : 12/10/22 Baseline Weight: Weight: 58.5 kg Most recent weight: Weight: 65.3 kg     Palliative Assessment/Data:PPS 50%     *Please note that this is a verbal dictation  therefore any spelling or grammatical errors are due to the "[place] [employer]" system interpretation.  Juel Burrow, DNP, AGNP-C Palliative Medicine Team [phone] Pager: [phone]

## 2022-12-11 NOTE — Progress Notes (Signed)
Icentive Spirometer given to patient. Pt demonstrated that he could use it. Pt able to do 726m.

## 2022-12-11 NOTE — Progress Notes (Signed)
Mobility Specialist - Progress Note   12/11/22 1100  Mobility  Activity Transferred from bed to chair  Level of Assistance Standby assist, set-up cues, supervision of patient - no hands on  Assistive Device Front wheel walker  Distance Ambulated (ft) 3 ft  Activity Response Tolerated well  Mobility Referral Yes  $Mobility charge 1 Mobility    Pt received in bed agreeable to mobility. No complaints throughout, encouraged PLB and rest break sitting EOB. Left in recliner w/ chair alarm on and respiratory therapist present in room.   Pre-mobility: SpO2 90% During mobility: SpO2 87% Post-mobility: SpO2 85%  [place] Specialist Please contact via Solicitor or Rehab office at [phone]

## 2022-12-11 NOTE — TOC Progression Note (Signed)
Transition of Care [facility]) - Progression Note    Patient Details  Name: [name] MRN: [id number] Date of Birth: 13-Oct-1946  [facility] CM/SW Contact  Zenon Mayo, RN Phone Number: 12/11/2022, 2:35 PM  Clinical Narrative:    NCM checked with [employer] to see what patient's concentrator goes up to she states it goes up to 5 liters if he needs anything greater than 5 liters will need new ambulatory sats and new orders reflecting that.    Expected Discharge Plan: [place] Barriers to Discharge: Continued Medical Work up  Expected Discharge Plan and Services In-house Referral: NA Discharge Planning Services: CM Consult Post Acute Care Choice: [place] arrangements for the past 2 months: Single Family Home                 DME Arranged: N/A DME Agency: NA       HH Arranged: PT HH Agency: [facility] Date [place]: 12/08/22 Time [place]: [id number] Representative spoke with at [place]: [place] Determinants of Health ([place]) Interventions SDOH Screenings   Food Insecurity: No Food Insecurity (06/01/2019)  Housing: Low Risk  (06/01/2019)  Depression (PHQ2-9): Low Risk  (06/01/2019)  Financial Resource Strain: Low Risk  (06/01/2019)  Physical Activity: Inactive (06/01/2019)  Stress: No Stress Concern Present (06/01/2019)  Tobacco Use: Medium Risk (10/23/2022)    Readmission Risk Interventions    12/08/2022    3:54 PM  Readmission Risk Prevention Plan  Transportation Screening Complete  PCP or Specialist Appt within 3-5 Days Complete  HRI or [place] Complete  Social Work Consult for [place] Planning/Counseling Complete  Palliative Care Screening Not Applicable  Medication Review Press photographer) Complete

## 2022-12-11 NOTE — Progress Notes (Signed)
Nurse requested Mobility Specialist to perform oxygen saturation test with pt which includes removing pt from oxygen both at rest and while ambulating.  Below are the results from that testing.      Patient Saturations on 8L O2 at Rest = spO2 97%   Patient Saturations on 8L O2 while Ambulating = sp02 85% .  Rested and performed pursed lip breathing for 1 minute with sp02 at 87%.   At end of testing pt left in room on 8 Liters of oxygen.   Reported results to nurse.    [place] Specialist Please contact via SecureChat or Rehab office at [phone]

## 2022-12-12 DIAGNOSIS — J441 Chronic obstructive pulmonary disease with (acute) exacerbation: Secondary | ICD-10-CM | POA: Diagnosis not present

## 2022-12-12 DIAGNOSIS — J849 Interstitial pulmonary disease, unspecified: Secondary | ICD-10-CM | POA: Diagnosis present

## 2022-12-12 DIAGNOSIS — U071 COVID-19: Secondary | ICD-10-CM | POA: Diagnosis not present

## 2022-12-12 DIAGNOSIS — I2699 Other pulmonary embolism without acute cor pulmonale: Secondary | ICD-10-CM | POA: Diagnosis not present

## 2022-12-12 DIAGNOSIS — J9621 Acute and chronic respiratory failure with hypoxia: Secondary | ICD-10-CM | POA: Diagnosis not present

## 2022-12-12 LAB — BASIC METABOLIC PANEL
Anion gap: 13 (ref 5–15)
BUN: 22 mg/dL (ref 8–23)
CO2: 28 mmol/L (ref 22–32)
Calcium: 7.9 mg/dL — ABNORMAL LOW (ref 8.9–10.3)
Chloride: 99 mmol/L (ref 98–111)
Creatinine, Ser: 1.12 mg/dL (ref 0.61–1.24)
GFR, Estimated: >60 mL/min (ref >60)
Glucose, Bld: 139 mg/dL — ABNORMAL HIGH (ref 70–99)
Potassium: 3.8 mmol/L (ref 3.5–5.1)
Sodium: 140 mmol/L (ref 135–145)

## 2022-12-12 LAB — GLUCOSE, CAPILLARY
Glucose-Capillary: 169 mg/dL — ABNORMAL HIGH (ref 70–99)
Glucose-Capillary: 74 mg/dL (ref 70–99)
Glucose-Capillary: 251 mg/dL — ABNORMAL HIGH (ref 70–99)
Glucose-Capillary: 223 mg/dL — ABNORMAL HIGH (ref 70–99)

## 2022-12-12 LAB — CBC
HCT: 26.2 % — ABNORMAL LOW (ref 39.0–52.0)
Hemoglobin: 8.4 g/dL — ABNORMAL LOW (ref 13.0–17.0)
MCH: 28.2 pg (ref 26.0–34.0)
MCHC: 32.1 g/dL (ref 30.0–36.0)
MCV: 87.9 fL (ref 80.0–100.0)
Platelets: 57 10*3/uL — ABNORMAL LOW (ref 150–400)
RBC: 2.98 MIL/uL — ABNORMAL LOW (ref 4.22–5.81)
RDW: 22.1 % — ABNORMAL HIGH (ref 11.5–15.5)
WBC: 2.3 10*3/uL — ABNORMAL LOW (ref 4.0–10.5)
nRBC: 3 % — ABNORMAL HIGH (ref 0.0–0.2)

## 2022-12-12 MED ORDER — SODIUM CHLORIDE 0.9 % IV SOLN
200.0000 mg | Freq: Once | INTRAVENOUS | Status: AC
  Administered 2022-12-12: 200 mg via INTRAVENOUS
  Filled 2022-12-12: qty 40

## 2022-12-12 MED ORDER — SODIUM CHLORIDE 0.9 % IV SOLN
100.0000 mg | Freq: Every day | INTRAVENOUS | Status: DC
  Administered 2022-12-13 – 2022-12-18 (×6): 100 mg via INTRAVENOUS
  Filled 2022-12-12 (×6): qty 20

## 2022-12-12 NOTE — Assessment & Plan Note (Signed)
-   Continue steroids - Hold Imuran - Pulmonology consult is appreciated.

## 2022-12-12 NOTE — Progress Notes (Signed)
Progress Note   Patient: [name] [id number] DOB: 1946-10-19 DOA: 12/07/2022     4 DOS: the patient was seen and examined on 12/12/2022 at 10:06AM      Brief hospital course: [name] is a 77 y.o. M with RA, ILD on Actemra and Imuran, emphysema and pHTN on Tyvaso and chronic respiratory failure on 4-5L at home, as well as recent COVID complicated by pancytopenia who presented with dyspnea and respiratory distress.      1/5-1/12: Admitted to [facility] for COVID, ABG showed pO2 36 on home 4L admitted to ICU on [place] and BiPAP; improved on steroids and discharged on Augmentin  1/21-1/29: Readmitted to [facility] with SOB/fever, this time found to have pancytopenia platelets 50K; treated with cefepime x9 days and discharged again home with [facility] on prednisone   2/5: Readmitted for respiratory distress 2/6: CCM consulted, recommended heparin, steroids 2/7: Echo shows stable cor pulmonale and pHTN 2/8: DVT study shows L gastroc DVT 2/9: Switched to apixaban, weaned to 6L at rest, but still 10L with ambulation 2/10: Still on 12L O2 with ambulation, Pulmonary consulted     Assessment and Plan: * Acute and chronic respiratory failure with hypoxia ([place]) Presented with hypoxia requiring BiPAP and nonrebreather.  Discussed with Pulmonology, Dr. Chase [name], likely from PE in setting of severe ILD and pHTN.  With his Rituxan, [name] think we have to also consider inability to mount response, and possibly ongoing COVID    Acute pulmonary embolism ([place]) - Continue apixaban - Consult IR for thrombectomy without tPA  COVID-19 virus infection Tested positive in Nov 06, 2022.  Given he is on Rituxan, have to consider ongoing COVID infection. - Start remdesivir - Airborne - Send COVID IGg Abs  Pulmonary hypertension ([place]) Echo shows severe pulmonary hypertension and TR, estimated pressures may be higher than last echo.  Got Lasix 2/9 - Consult Pulmonology appreciate cares - Continue  Treprostinil   Lobar pneumonia ([place]) CT chest showed RIGHT basilar basilar dependent consolidation suspicious for pneumonia.   - Continue Rocephin, day 5 - Continue aggressive pulmonary toilet  Pancytopenia ([place]) This first was noted mid Jan during admission at [facility], again WBC <4, Hgb <8, and platelets <50.  Suspect bone marrow suppression from COVID, in setting of Imuran  B12, folate, smear unremarkable.  Retics hypoproliferative. 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 - Continue steroids - Hold Imuran - Consult Pulmonology  Pressure injury of skin  Sacrum, stage II, present on admission  Type 2 diabetes mellitus (HCC) Glucose controlled - Hold metformin and NPH - Continue SS corrections  Elevated troponin I level Likely myocardial injury due to COVID, ischemia ruled out  Hypokalemia Supplemented  Hypomagnesemia Supplemented  Essential hypertension - Hold amlodipine given soft BP  Dyslipidemia - Hold lipitor  Hypothyroidism - Continue levothyroxine  Rheumatoid arthritis (HCC) - Hold Actemra, Imuran - Continue steroids          Subjective: Patient feeling about at baseline, still dyspneic with exertion, no chest pain, no fever, no sputum.     Physical Exam: BP (!) 115/53 (BP Location: Right Arm)   Pulse 86   Temp 98.6 F (37 C) (Oral)   Resp 18   Ht 5' 6"$  (1.676 m)   Wt 63.1 kg   SpO2 98%  BMI 22.45 kg/m   Thin adult male, mostly edentulous, interactive and appropriate RRR, no murmurs, no peripheral edema Respiratory rate seems normal at rest, increased with exertion, wheezing bilaterally, overall diminished Abdomen soft without tenderness palpation or guarding   Data Reviewed: Discussed with  pulmonology Patient metabolic panel unremarkable Glucose is normal Pancytopenia slowly improving   Family Communication: Wife by phone    Disposition: Status is: Inpatient Patient presented with respiratory failure in the setting of severe pulmonary hypertension, advanced interstitial lung disease, no pulmonary embolism, and possibly ongoing COVID  Pulmonology been consulted, and overall his prognosis is tenuous        Author: Edwin Dada, MD 12/12/2022 4:09 PM  For on call review [email or web address].

## 2022-12-12 NOTE — Significant Event (Signed)
SATURATION QUALIFICATIONS: (This note is used to comply with regulatory documentation for home oxygen)  Patient Saturations on Room Air at Rest = 91%  Patient Saturations on Room Air while Ambulating = 70%- 80%  Patient Saturations on 4 Liters of oxygen while Ambulating = 74%  Please briefly explain why patient needs home oxygen: the patient desats in the low 70"s and 80's while ambulating. Will walk again on 10 liters.

## 2022-12-12 NOTE — Plan of Care (Signed)
  Problem: Respiratory: Goal: Will maintain a patent airway Note: Pt walked the hall and desat to 70. The oxygen was increased to 12 liter high flow. At rest weaned back down to baseline  at 5 liters

## 2022-12-12 NOTE — Consult Note (Signed)
NAME:  [name], MRN:  [id number], DOB:  1946-08-06, LOS: 4 ADMISSION DATE:  12/07/2022, CONSULTATION DATE:  2/6  REFERRING MD:  Dr. Sloan Leiter, CHIEF COMPLAINT:  acute respiratory failure w/ hypoxia   History of Present Illness:  Patient is a 77 year old male with pertinent PMH of ILD - RA on 4L [place] (seen by Dr. Chase Caller), [place] on Tyvaso, RA, T2DM, HTN, hypothyroidism presents to [facility] ED on 2/6 with respiratory distress.  Patient came to [facility] ED on 2/6 for acute onset respiratory distress with congested cough.  Does have wheezing.  Sats on room air low 80s-90s.  On 4 LNC at home.  Required BiPAP for WOB. BP stable. CXR w/ chronic residual lung disease. Covid positive. D-dimer elevated. CTA chest showing PE b/l inferior basilar posterior segmental PE, RV strain; RV/LV 1.8 consistent w/ submassive. PCCM consulted.  Pertinent  Medical History    Follow-up interstitial lung disease UIP pattern associated with rheumatoid arthritis.              - Rx Rituxan since approx Oct/Nov 2018. ->  Last dose every 6 months approximately October 21, 2022.                         -> Moving to tocilizumab subcutaneous injections December 2023/January 2024. - On immuran,  -On prednisone and  = high risk medication use             - started ofev mid -  end of march 2021 for progressive disease -> changed to Esbreit Mid  June 2021 -> stopped sept 2021 due to rash -> not on anti-foibritc             -Participant in ILD-pro registry study     Pulm HTn [place] 11/18/20 - Dr Quillian Quince Bensimhon   Repeat [place]  c/w mild to moderate pulmonary HTN with high cardiac output. PA = 59/18 (34). PCW = 10. PVR = 3.1  -  started tyvaso early march 2022 - through Audry Riles - CHF Pharmacist   Associated mild emphysema [previous intolerance to  Spiriva)             - feb 2022 On inceuse   COvid prophylassix (IgG negative Feb 2022)  - tixagevimab/cilgavimab, for pre-exposure prophylaxis  Feb 2022 - repeat dosing March 2022    Chronic cough on -Hycodan opiod   9 mm with a lingular nodule on CT August 2023   Abnormal weight loss - active as of dec 2023   Severe chronic hypoxemic respiratory failure due to all of the above: 4 L oxygen as of December 2023.    Past Medical History:  Diagnosis Date   Arthritis    COPD (chronic obstructive pulmonary disease) ([place])    Diabetes mellitus    Hypertension    Pneumonia    Thyroid disease      Significant Hospital Events: Including procedures, antibiotic start and stop dates in addition to other pertinent events   1/5-1/12: Admitted to [facility] for COVID, ABG showed pO2 36 on home 4L admitted to ICU on [facility]HFNC and BiPAP; improved on steroids and discharged on Augmentin COVID PCR positive  1/21-1/29: Readmitted to [facility] with SOB/fever, this time found to have pancytopenia platelets 50K; treated with cefepime x9 days and discharged again home with [facility] on prednisone  2/5: Readmitted for respiratory distress  COVID PCR = POSITIVE (negative last April 222, Feb 2022  - IgG neg) PE with RV Strain  2/6 CCM consultd:" admitted to [facility] w/ hypoixc resp failure; covid positive, CTA showing PE b/l inferior basilar posterior segmental PE, RV strain; RV/LV 1.8 consistent w/ submassive 2/7: Echo shows stable cor pulmonale and pHTN RSVP 95.6 (Jan 2022 was in 60s) 2/8: DVT study shows L gastroc DVT 2/9: Switched to apixaban, weaned to 6L at rest, but still 10L with ambulation   Interim History / Subjective:    2/10:  on 5L [place] at rest. PCCM called back due to hypoxemia worse than baseline and needing 10L [place] at rest. Patient says he is not doing well. Has lost weight and progressive hypoxemia. Chart review  - IgG deficient since 2018 vua Rituxan  - Covid Positie Jan 2022 without IgG responsed. Needed MAB Rx  - Covid admit Jan 2024 at [facility] not known  - Readmit 12/07/2022   - Submassive PE - woprsening of baseline RSVP from 6o - 80 and RV/LV ratio 1.8 (distal  PE)  Objective   Blood pressure (!) 115/53, pulse 86, temperature 98.6 F (37 C), temperature source Oral, resp. rate 18, height 5' 6"$  (1.676 m), weight 63.1 kg, SpO2 98 %.        Intake/Output Summary (Last 24 hours) at 12/12/2022 1417 Last data filed at 12/12/2022 0858 Gross per 24 hour  Intake 477 ml  Output 3550 ml  Net -3073 ml   Filed Weights   12/10/22 0620 12/11/22 0445 12/12/22 0246  Weight: 63.2 kg 65.3 kg 63.1 kg    Examination: General Appearance:  Looks EMACIATED  Head:  Normocephalic, without obvious abnormality, atraumatic Eyes:  PERRL - yes, conjunctiva/corneas - muddy     Ears:  Normal external ear canals, both ears Nose:  G tube - no but on 5L [place] Throat:  ETT TUBE - no , OG tube - no Neck:  Supple,  No enlargement/tenderness/nodules Lungs: CRACKLES lung base. Full sentences at rest. 5L [place] Heart:  S1 and S2 normal, no murmur, CVP - no.  Pressors - no Abdomen:  Soft, no masses, no organomegaly Genitalia / Rectal:  Not done Extremities:  Extremities- intact Skin:  ntact in exposed areas . Sacral area - not examined Neurologic:  Sedation - none -> RASS - +_1 . Moves all 4s - yes. CAM-ICU - neg . Orientation - x3+         Resolved Hospital Problem list     Assessment & Plan:  Segmental but Submassive PE: CTA showing PE b/l inferior basilar posterior segmental PE, RV strain; RV/LV 1.8 consistent w/ submassive. REcent risk factors are 2 x admits and COVID Jan 2024  2/10: I think the PE on top of WHO-3 Pulm htn is definitely contributing to his hypoxemia.Currently on Eliquis  Plan  - Cotninue DOAC - check trop, bnp, lactate, d-dimer 12/13/22 - ? IR to opine if there is value for thrombectomy (HIgh RSVP could be  a barrier for sedation) - 30 day mortaliuty is high - low patletes relative preclusion for  TPA/  COVID-19  2/10 PCR positive in setting of Rituxan mediated IgG deficienty. I think he has low grade replication going on based ony clinical  experience on this patient population + evidence of DVT/PE as above + anecdotal case discussions with [facility]  Plan  - restart anti-viral - initially IV remdesivir. Might need 10-15 days of same - check Covid IgG (he was negative for this in 2022)  #RA-ILD - baseline not on antifbirotic du eto intoelrance  2/10 - at risk for  worsening due to covid.   Plan  -monitor clinically - rituxan Q6 months (last dose dec 2023) -> Actemra in May-June 2023 if he survives -continue daily steroids  WHO-3 grop 3 PAH -baseline RSVP 60s in 2022 Acute cor pulmonale now due to PE - RSVP 90s  Plan  - contine tyvaso   MILd COPD  Plan  - continue baseline BD  OVerall  Very poor prognosis If no response to anti-viral over the next few to several days to a week -> hospice approproiate. DNR appropriate. I did tell him that he is facing sginficiant threats to his life  Best Practice (right click and "Reselect all SmartList Selections" daily)   Per primary    SIGNATURE    Dr. Brand Males, [name], [name],  Pulmonary and Critical Care Medicine Staff Physician, [place] Director - Interstitial Lung Disease  Program  Medical Director - [place] [facility] [place] at [place], [place], [address]   Pager: [phone], If no answer  -[place] or [place] [phone] Telephone (clinical office): [phone] Telephone (research): [phone]  2:21 PM 12/12/2022    LABS    PULMONARY No results for input(s): "PHART", "PCO2ART", "PO2ART", "HCO3", "TCO2", "O2SAT" in the last 168 hours.  Invalid input(s): "PCO2", "PO2"  CBC Recent Labs  Lab 12/10/22 0049 12/11/22 0048 12/12/22 0036  HGB 7.6* 8.2* 8.4*  HCT 24.2* 26.2* 26.2*  WBC 3.5* 2.3* 2.3*  PLT 49* 55* 57*    COAGULATION No results for input(s): "INR" in the last 168 hours.  CARDIAC  No results for input(s): "TROPONINI" in the last 168 hours. No  results for input(s): "PROBNP" in the last 168 hours.   CHEMISTRY Recent Labs  Lab 12/07/22 2103 12/08/22 0335 12/09/22 0122 12/10/22 0049 12/12/22 0036  NA 137  --  138 138 140  K 3.1*  --  4.7 4.9 3.8  CL 98  --  104 104 99  CO2 30  --  24 26 28  $ GLUCOSE 206*  --  235* 72 139*  BUN 16  --  24* 23 22  CREATININE 1.19  --  1.19 1.05 1.12  CALCIUM 7.9*  --  7.5* 7.5* 7.9*  MG  --  1.5* 2.4  --   --    Estimated Creatinine Clearance: 49.3 mL/min (by C-G formula based on SCr of 1.12 mg/dL).   LIVER Recent Labs  Lab 12/09/22 0122 12/10/22 0049  AST 24 26  ALT 18 23  ALKPHOS 28* 28*  BILITOT 0.9 0.6  PROT 4.9* 4.7*  ALBUMIN 2.4* 2.4*     INFECTIOUS Recent Labs  Lab 12/08/22 0422 12/08/22 1650 12/08/22 1943  LATICACIDVEN  --  3.5* 3.0*  PROCALCITON 0.49  --   --      ENDOCRINE CBG (last 3)  Recent Labs    12/11/22 2041 12/12/22 0536 12/12/22 1141  GLUCAP 194* 169* 74         IMAGING x48h  - image(s) personally visualized  -   highlighted in bold No results found.

## 2022-12-13 DIAGNOSIS — J441 Chronic obstructive pulmonary disease with (acute) exacerbation: Secondary | ICD-10-CM | POA: Diagnosis not present

## 2022-12-13 DIAGNOSIS — U071 COVID-19: Secondary | ICD-10-CM | POA: Diagnosis not present

## 2022-12-13 DIAGNOSIS — J9621 Acute and chronic respiratory failure with hypoxia: Secondary | ICD-10-CM | POA: Diagnosis not present

## 2022-12-13 DIAGNOSIS — I2699 Other pulmonary embolism without acute cor pulmonale: Secondary | ICD-10-CM | POA: Diagnosis not present

## 2022-12-13 LAB — CBC
HCT: 30.7 % — ABNORMAL LOW (ref 39.0–52.0)
Hemoglobin: 9.6 g/dL — ABNORMAL LOW (ref 13.0–17.0)
MCH: 27 pg (ref 26.0–34.0)
MCHC: 31.3 g/dL (ref 30.0–36.0)
MCV: 86.5 fL (ref 80.0–100.0)
Platelets: 69 10*3/uL — ABNORMAL LOW (ref 150–400)
RBC: 3.55 MIL/uL — ABNORMAL LOW (ref 4.22–5.81)
RDW: 21.6 % — ABNORMAL HIGH (ref 11.5–15.5)
WBC: 2.1 10*3/uL — ABNORMAL LOW (ref 4.0–10.5)
nRBC: 2.9 % — ABNORMAL HIGH (ref 0.0–0.2)

## 2022-12-13 LAB — C-REACTIVE PROTEIN: CRP: 2.4 mg/dL — ABNORMAL HIGH (ref <1.0)

## 2022-12-13 LAB — GLUCOSE, CAPILLARY
Glucose-Capillary: 59 mg/dL — ABNORMAL LOW (ref 70–99)
Glucose-Capillary: 139 mg/dL — ABNORMAL HIGH (ref 70–99)
Glucose-Capillary: 56 mg/dL — ABNORMAL LOW (ref 70–99)
Glucose-Capillary: 215 mg/dL — ABNORMAL HIGH (ref 70–99)
Glucose-Capillary: 194 mg/dL — ABNORMAL HIGH (ref 70–99)
Glucose-Capillary: 122 mg/dL — ABNORMAL HIGH (ref 70–99)
Glucose-Capillary: 84 mg/dL (ref 70–99)

## 2022-12-13 LAB — LACTIC ACID, PLASMA
Lactic Acid, Venous: 2.9 mmol/L (ref 0.5–1.9)
Lactic Acid, Venous: 1.6 mmol/L (ref 0.5–1.9)
Lactic Acid, Venous: 2.3 mmol/L (ref 0.5–1.9)

## 2022-12-13 LAB — COMPREHENSIVE METABOLIC PANEL
ALT: 26 U/L (ref 0–44)
AST: 18 U/L (ref 15–41)
Albumin: 2.6 g/dL — ABNORMAL LOW (ref 3.5–5.0)
Alkaline Phosphatase: 39 U/L (ref 38–126)
Anion gap: 12 (ref 5–15)
BUN: 18 mg/dL (ref 8–23)
CO2: 31 mmol/L (ref 22–32)
Calcium: 8.3 mg/dL — ABNORMAL LOW (ref 8.9–10.3)
Chloride: 100 mmol/L (ref 98–111)
Creatinine, Ser: 0.95 mg/dL (ref 0.61–1.24)
GFR, Estimated: >60 mL/min (ref >60)
Glucose, Bld: 36 mg/dL — CL (ref 70–99)
Potassium: 3.3 mmol/L — ABNORMAL LOW (ref 3.5–5.1)
Sodium: 143 mmol/L (ref 135–145)
Total Bilirubin: 0.4 mg/dL (ref 0.3–1.2)
Total Protein: 5.3 g/dL — ABNORMAL LOW (ref 6.5–8.1)

## 2022-12-13 LAB — BRAIN NATRIURETIC PEPTIDE: B Natriuretic Peptide: 808.2 pg/mL — ABNORMAL HIGH (ref 0.0–100.0)

## 2022-12-13 LAB — TROPONIN I (HIGH SENSITIVITY): Troponin I (High Sensitivity): 110 ng/L (ref <18)

## 2022-12-13 MED ORDER — LACTATED RINGERS IV BOLUS
500.0000 mL | Freq: Once | INTRAVENOUS | Status: AC
  Administered 2022-12-13: 500 mL via INTRAVENOUS

## 2022-12-13 MED ORDER — IPRATROPIUM-ALBUTEROL 20-100 MCG/ACT IN AERS: 1.0000 | INHALATION_SPRAY | Freq: Four times a day (QID) | RESPIRATORY_TRACT | Status: DC | PRN

## 2022-12-13 MED ORDER — POTASSIUM CHLORIDE 2 MEQ/ML IV SOLN
INTRAVENOUS | Status: DC
  Filled 2022-12-13 (×2): qty 1000

## 2022-12-13 NOTE — Progress Notes (Signed)
Progress Note   Patient: [name] [id number] DOB: 1946/10/28 DOA: 12/07/2022     5 DOS: the patient was seen and examined on 12/13/2022        Brief hospital course: [name] is a 77 y.o. M with RA, ILD on Actemra and Imuran, emphysema and pHTN on Tyvaso and chronic respiratory failure on 4-5L at home, as well as recent COVID complicated by pancytopenia who presented with dyspnea and respiratory distress.      1/5-1/12: Admitted to [facility] for COVID, ABG showed pO2 36 on home 4L admitted to ICU on [place] and BiPAP; improved on steroids and discharged on Augmentin  1/21-1/29: Readmitted to [facility] with SOB/fever, this time found to have pancytopenia platelets 50K; treated with cefepime x9 days and discharged again home with [facility] on prednisone   2/5: Readmitted for respiratory distress 2/6: CCM consulted, recommended heparin, steroids 2/7: Echo shows stable cor pulmonale and pHTN 2/8: DVT study shows L gastroc DVT 2/9: Switched to apixaban, weaned to 6L at rest, but still 10L with ambulation 2/10: Still on 12L O2 with ambulation, Pulmonary consulted 2/11: No change      Assessment and Plan: * Acute and chronic respiratory failure with hypoxia ([place]) Presented with hypoxia requiring BiPAP and nonrebreather.    Likely from PE in setting of severe severe ILD and pHTN.  With his Rituxan, Pulm think we have to also consider possibly ongoing COVID    Acute pulmonary embolism ([place]) Discussed with IR, clots are too peripheral to safely remove. - Continue apixaban  COVID-19 virus infection Tested positive in Nov 06, 2022.  Given he is on Rituxan, I agree with Pulmonology, have to consider ongoing COVID infection.  SARS-CoV-2 Abs undetectable. - Continue remdesivir, day 2 - Consult Pulmonology, appreciate cares, will defer to Pulm duration of antivirals   Pulmonary hypertension ([place]) Echo shows severe pulmonary hypertension and TR, estimated pressures may be higher than  last echo.  Treated with Lasix 2/9 - Consult Pulmonology appreciate cares - Continue Treprostinil   Lobar pneumonia ([place]) CT chest showed RIGHT basilar basilar dependent consolidation suspicious for pneumonia.    Completed 5 days Rocephin.  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 - Continue steroids - Hold Imuran - Consult Pulmonology  Pancytopenia ([place]) Thrombocytopenia ([place]) This first was noted mid Jan during admission at [facility], again WBC <4, Hgb <8, and platelets <50.  Suspect bone marrow suppression from COVID, in setting of Imuran  B12, folate, smear unremarkable.  Retics hypoproliferative. - Hold Imuran now and at discharge - Follow up with Hematology and Rheumatology and Pulmonology  COPD with acute exacerbation ([place]) - Continue steroids - Continue bronchodilators    Pressure injury of skin  Sacrum, stage II, present on admission  Type 2 diabetes mellitus (HCC) Glucose controlled - Hold metformin and NPH - Continue SS corrections  Elevated troponin I level Likely myocardial injury due to COVID, ischemia ruled out  Hypokalemia Supplemented  Hypomagnesemia Supplemented  Essential hypertension - Hold amlodipine given soft BP  Dyslipidemia - Hold lipitor  Hypothyroidism - Continue levothyroxine  Rheumatoid arthritis (HCC) - Hold Actemra, Imuran - Continue steroids  Moderate protein calorie malnutrition As evidenced by loss of subcutaneous muscle mass and fat, 40 pound weight loss in the last 12 months in setting of his interstitial lung disease, pulmonary hypertension.        Subjective: Patient feels about the same, no new nursing concerns.     Physical Exam: BP 121/71   Pulse 82  Temp 98.1 F (36.7 C) (Oral)   Resp 19   Ht 5' 6"$  (1.676 m)   Wt 62.7 kg   SpO2 96%   BMI 22.32 kg/m   Thin adult male, lying in bed, appears tired, watching television RRR, soft systolic murmur, no peripheral edema Respiratory rate normal,  labored with exertion, lung sounds diminished, no rales or wheezes Abdomen soft without tenderness palpation or guarding, no ascites or distention  Data Reviewed: Discussed with pulmonology and interventional radiology Basic metabolic panel shows mild hypokalemia, normal renal function BNP, elevated Troponin elevated but improved from admission Lactic acid trending down slightly, but still greater than 2, in the setting of respiratory failure Platelets up to 69,000 COVID antibodies undetectable    Family Communication: By phone    Disposition: Status is: Inpatient Patient presented with respiratory failure in the setting of severe interstitial lung disease, severe pulmonary hypertension, and new pulmonary embolism as well as ongoing COVID.  Pulmonology has been consulted, his overall gnosis is tenuous, we will plan to continue steroids and remdesivir for now, pulmonology helping to coordinate care        Author: Edwin Dada, MD 12/13/2022 3:33 PM  For on call review [email or web address].

## 2022-12-13 NOTE — Progress Notes (Signed)
Denman George RN, made hospitalist aware of critical lab value, lactic acid 2.3.

## 2022-12-13 NOTE — Progress Notes (Signed)
Mobility Specialist Progress Note:   12/13/22 1549  Mobility  Activity Ambulated with assistance in room  Level of Assistance Standby assist, set-up cues, supervision of patient - no hands on  Assistive Device Four wheel walker  Distance Ambulated (ft) 50 ft  Activity Response Tolerated fair  $Mobility charge 1 Mobility   Pt in bed agreeable to mobility. No complaints of pain. Left in bed with call bell in reach and all needs met.   O2  99% (6L) @ rest 87% (6L) ambulating 90% (8L) ambulating 85% (8L ) Sitting EOB after ambulation 91% (8L) ~ 2 minutes sitting EOB 92% (6L) laying in bed   [employer] Dulcemaria Bula Mobility Specialist Please contact via [place] or  [place] at [phone]

## 2022-12-13 NOTE — Progress Notes (Signed)
NAME:  [name], MRN:  [id number], DOB:  10-06-1946, LOS: 5 ADMISSION DATE:  12/07/2022, CONSULTATION DATE:  2/6  REFERRING MD:  Dr. Sloan Leiter, CHIEF COMPLAINT:  acute respiratory failure w/ hypoxia   History of Present Illness:  Patient is a 77 year old male with pertinent PMH of ILD - RA on 4L [place] (seen by Dr. Chase Caller), [place] on Tyvaso, RA, T2DM, HTN, hypothyroidism presents to [facility] ED on 2/6 with respiratory distress.  Patient came to [facility] ED on 2/6 for acute onset respiratory distress with congested cough.  Does have wheezing.  Sats on room air low 80s-90s.  On 4 LNC at home.  Required BiPAP for WOB. BP stable. CXR w/ chronic residual lung disease. Covid positive. D-dimer elevated. CTA chest showing PE b/l inferior basilar posterior segmental PE, RV strain; RV/LV 1.8 consistent w/ submassive. PCCM consulted.  Pertinent  Medical History    Follow-up interstitial lung disease UIP pattern associated with rheumatoid arthritis.              - Rx Rituxan since approx Oct/Nov 2018. ->  Last dose every 6 months approximately October 21, 2022.                         -> Moving to tocilizumab subcutaneous injections December 2023/January 2024. - On immuran,  -On prednisone and  = high risk medication use             - started ofev mid -  end of march 2021 for progressive disease -> changed to Esbreit Mid  June 2021 -> stopped sept 2021 due to rash -> not on anti-foibritc             -Participant in ILD-pro registry study     Pulm HTn [place] 11/18/20 - Dr Quillian Quince Bensimhon   Repeat [place]  c/w mild to moderate pulmonary HTN with high cardiac output. PA = 59/18 (34). PCW = 10. PVR = 3.1  -  started tyvaso early march 2022 - through Audry Riles - CHF Pharmacist   Associated mild emphysema [previous intolerance to  Spiriva)             - feb 2022 On inceuse   COvid prophylassix (IgG negative Feb 2022)  - tixagevimab/cilgavimab, for pre-exposure prophylaxis  Feb 2022 - repeat dosing March 2022    Chronic cough on -Hycodan opiod   9 mm with a lingular nodule on CT August 2023   Abnormal weight loss - active as of dec 2023   Severe chronic hypoxemic respiratory failure due to all of the above: 4 L oxygen as of December 2023.       Significant Hospital Events: Including procedures, antibiotic start and stop dates in addition to other pertinent events   1/5-1/12: Admitted to [facility] for COVID, ABG showed pO2 36 on home 4L admitted to ICU on [place] and BiPAP; improved on steroids and discharged on Augmentin COVID PCR positive  1/21-1/29: Readmitted to [facility] with SOB/fever, this time found to have pancytopenia platelets 50K; treated with cefepime x9 days and discharged again home with [facility] on prednisone  2/5: Readmitted for respiratory distress  COVID PCR = POSITIVE (negative last April 222, Feb 2022  - IgG neg) PE with RV Strain  2/6 CCM consultd:"  -> signed off 2/7: Echo shows stable cor pulmonale and pHTN RSVP 95.6 (Jan 2022 was in 60s) 2/8: DVT study shows L gastroc DVT 2/9: Switched to apixaban, weaned to 6L at  rest, but still 10L with ambulation 2/10:  on 5L [place] at rest. PCCM called back due to hypoxemia worse than baseline and needing 10L [place] at rest. Patient says he is not doing well. Has lost weight and progressive hypoxemia. Chart review  IgG deficient since 2018 vua Rituxan Covid Positie Jan 2022 without IgG responsed. Needed MAB Rx.   Covid admit Jan 2024 at [place] not known.  Readmit 12/07/2022: Submassive PE - woprsening of baseline RSVP from 6o  > 95  and RV/LV ratio 1.8 (distal PE)   Interim History / Subjective:    2/11 - 6L [place] rest.. COVID PCR Positive but IgG negative (On Rituxan x many years, last dose dec 2023).  On remedesivir.  - BNO 800 (admit 1000), norma in 2022  - Lactate now > 2  - Trop 110 (admit 282)  Objective   Blood pressure 121/71, pulse 82, temperature 98.1 F (36.7 C), temperature source Oral, resp. rate 19, height 5' 6"$  (1.676  m), weight 62.7 kg, SpO2 96 %.        Intake/Output Summary (Last 24 hours) at 12/13/2022 1422 Last data filed at 12/13/2022 1324 Gross per 24 hour  Intake 827 ml  Output 1300 ml  Net -473 ml   Filed Weights   12/11/22 0445 12/12/22 0246 12/13/22 0026  Weight: 65.3 kg 63.1 kg 62.7 kg    General: No distress. Looks emaciated. On 6L [place] at rest - pulse ox 96% Neuro: Alert and Oriented x 3. GCS 15. Speech normal Psych: Pleasant Resp:  Barrel Chest - no.  Wheeze - no, Crackles - yes, No overt respiratory distress CVS: Normal heart sounds. Murmurs - no Ext: Stigmata of Connective Tissue Disease - RA with clubbing HEENT: Normal upper airway. PEERL +. No post nasal drip         Resolved Hospital Problem list     Assessment & Plan:  Segmental but Submassive PE: CTA showing PE b/l inferior basilar posterior segmental PE, RV strain; RV/LV 1.8 consistent w/ submassive. REcent risk factors for PE are 2 x admits and COVID Jan 2024  2/11 - On eliqus but still having significant evidence of RV strain (high bnp, trop leak and lactate)  Plan - D/w Dr Loleta Books - he will call IR: to opine if there is value for thrombectomy (HIgh RSVP could be  a barrier for sedation). Consult ordered  - if good case for thrombectomy -> then change DOAC to IV heparin (platelets better)  - ? Also consider EKOS (has low platelets but better) - 30 day mortaliuty is high   COVID-19 - at admit +ve (Low grade infectioin with IgG negagive due to Rituxan)  2/11 - features c/w low grade active covid replication (Rituxan, IgG negative to Covid, persistent PCR positive)  Plan  - restart anti-viral -  IV remdesivir 12/12/22 -> at some point can change to 10 days of oral antiviral (total 10-15 days course)  #RA-ILD - baseline not on antifbirotic du eto intoelrance  2/10 - at risk for worsening due to covid.   Plan  -monitor clinically - rituxan Q6 months (last dose dec 2023) -> Actemra in May-June 2024 if he  survives (Dr Chase Caller coordinating) -continue daily steroids  WHO-3 grop 3 PAH -baseline RSVP 70s in 2022 Acute cor pulmonale now due to PE - RSVP 90s  Plan  - contine tyvaso   MILd COPD  Plan  - continue baseline BD  OVerall  Very poor prognosis If no response  to anti-viral over the next few to several days to a week -> hospice approproiate. DNR appropriate.  2/10 -  I did tell him that he is facing sginficiant threats to his life  2/11 - did indicate to him if [name] not improving with anti-viral or PE cannot be improved: that hospice is best options  Best Practice (right click and "Reselect all SmartList Selections" daily)   Per primary    SIGNATURE    Dr. Brand Males, [name], [name],  Pulmonary and Critical Care Medicine Staff Physician, [place] Director - Interstitial Lung Disease  Program  Medical Director - [facility] [place] at [place], [place], [address]   Pager: [phone], If no answer  -[place] or [place] [phone] Telephone (clinical office): [phone] Telephone (research): [phone]  2:22 PM 12/13/2022    LABS    PULMONARY No results for input(s): "PHART", "PCO2ART", "PO2ART", "HCO3", "TCO2", "O2SAT" in the last 168 hours.  Invalid input(s): "PCO2", "PO2"  CBC Recent Labs  Lab 12/11/22 0048 12/12/22 0036 12/13/22 0055  HGB 8.2* 8.4* 9.6*  HCT 26.2* 26.2* 30.7*  WBC 2.3* 2.3* 2.1*  PLT 55* 57* 69*    COAGULATION No results for input(s): "INR" in the last 168 hours.  CARDIAC  No results for input(s): "TROPONINI" in the last 168 hours. No results for input(s): "PROBNP" in the last 168 hours.   CHEMISTRY Recent Labs  Lab 12/07/22 2103 12/08/22 0335 12/09/22 0122 12/10/22 0049 12/12/22 0036 12/13/22 0055  NA 137  --  138 138 140 143  K 3.1*  --  4.7 4.9 3.8 3.3*  CL 98  --  104 104 99 100  CO2 30  --  24 26 28 31  $ GLUCOSE  206*  --  235* 72 139* 36*  BUN 16  --  24* 23 22 18  $ CREATININE 1.19  --  1.19 1.05 1.12 0.95  CALCIUM 7.9*  --  7.5* 7.5* 7.9* 8.3*  MG  --  1.5* 2.4  --   --   --    Estimated Creatinine Clearance: 57.8 mL/min (by C-G formula based on SCr of 0.95 mg/dL).   LIVER Recent Labs  Lab 12/09/22 0122 12/10/22 0049 12/13/22 0055  AST 24 26 18  $ ALT 18 23 26  $ ALKPHOS 28* 28* 39  BILITOT 0.9 0.6 0.4  PROT 4.9* 4.7* 5.3*  ALBUMIN 2.4* 2.4* 2.6*     INFECTIOUS Recent Labs  Lab 12/08/22 0422 12/08/22 1650 12/13/22 0055 12/13/22 0648 12/13/22 1108  LATICACIDVEN  --    < > 2.9* 1.6 2.3*  PROCALCITON 0.49  --   --   --   --    < > = values in this interval not displayed.     ENDOCRINE CBG (last 3)  Recent Labs    12/13/22 0258 12/13/22 0605 12/13/22 1054  GLUCAP 139* 215* 194*         IMAGING x48h  - image(s) personally visualized  -   highlighted in bold No results found.

## 2022-12-14 DIAGNOSIS — J9621 Acute and chronic respiratory failure with hypoxia: Secondary | ICD-10-CM | POA: Diagnosis not present

## 2022-12-14 LAB — COMPREHENSIVE METABOLIC PANEL
ALT: 19 U/L (ref 0–44)
AST: 13 U/L — ABNORMAL LOW (ref 15–41)
Albumin: 2 g/dL — ABNORMAL LOW (ref 3.5–5.0)
Alkaline Phosphatase: 33 U/L — ABNORMAL LOW (ref 38–126)
Anion gap: 10 (ref 5–15)
BUN: 19 mg/dL (ref 8–23)
CO2: 31 mmol/L (ref 22–32)
Calcium: 7.9 mg/dL — ABNORMAL LOW (ref 8.9–10.3)
Chloride: 100 mmol/L (ref 98–111)
Creatinine, Ser: 0.8 mg/dL (ref 0.61–1.24)
GFR, Estimated: >60 mL/min (ref >60)
Glucose, Bld: 93 mg/dL (ref 70–99)
Potassium: 3.8 mmol/L (ref 3.5–5.1)
Sodium: 141 mmol/L (ref 135–145)
Total Bilirubin: 0.3 mg/dL (ref 0.3–1.2)
Total Protein: 3.9 g/dL — ABNORMAL LOW (ref 6.5–8.1)

## 2022-12-14 LAB — CBC
HCT: 24.3 % — ABNORMAL LOW (ref 39.0–52.0)
Hemoglobin: 7.6 g/dL — ABNORMAL LOW (ref 13.0–17.0)
MCH: 27.1 pg (ref 26.0–34.0)
MCHC: 31.3 g/dL (ref 30.0–36.0)
MCV: 86.8 fL (ref 80.0–100.0)
Platelets: 61 10*3/uL — ABNORMAL LOW (ref 150–400)
RBC: 2.8 MIL/uL — ABNORMAL LOW (ref 4.22–5.81)
RDW: 21.7 % — ABNORMAL HIGH (ref 11.5–15.5)
WBC: 1.8 10*3/uL — ABNORMAL LOW (ref 4.0–10.5)
nRBC: 5 % — ABNORMAL HIGH (ref 0.0–0.2)

## 2022-12-14 LAB — SAR COV2 SEROLOGY (COVID19)AB(IGG),IA: SARS-CoV-2 Ab, IgG: NONREACTIVE

## 2022-12-14 LAB — GLUCOSE, CAPILLARY
Glucose-Capillary: 123 mg/dL — ABNORMAL HIGH (ref 70–99)
Glucose-Capillary: 157 mg/dL — ABNORMAL HIGH (ref 70–99)
Glucose-Capillary: 56 mg/dL — ABNORMAL LOW (ref 70–99)
Glucose-Capillary: 223 mg/dL — ABNORMAL HIGH (ref 70–99)

## 2022-12-14 NOTE — Progress Notes (Signed)
NAME:  [name], MRN:  [id number], DOB:  [birth year], LOS: 6 ADMISSION DATE:  12/07/2022, CONSULTATION DATE:  2/6  REFERRING MD:  Dr. Sloan Leiter, CHIEF COMPLAINT:  acute respiratory failure w/ hypoxia   History of Present Illness:  Patient is a 77 year old male with pertinent PMH of ILD - RA on 4L [place] (seen by Dr. Chase Caller), [place] on Tyvaso, RA, T2DM, HTN, hypothyroidism presents to [facility] ED on 2/6 with respiratory distress.  Patient came to [facility] ED on 2/6 for acute onset respiratory distress with congested cough.  Does have wheezing.  Sats on room air low 80s-90s.  On 4 LNC at home.  Required BiPAP for WOB. BP stable. CXR w/ chronic residual lung disease. Covid positive. D-dimer elevated. CTA chest showing PE b/l inferior basilar posterior segmental PE, RV strain; RV/LV 1.8 consistent w/ submassive. PCCM consulted.  Pertinent  Medical History  ILD - RA on 4L [place] (seen by Dr. Chase Caller), [place] on Tyvaso, RA, T2DM, HTN, hypothyroidism   Significant Hospital Events: Including procedures, antibiotic start and stop dates in addition to other pertinent events   1/5-1/12: Admitted to [facility] for COVID, ABG showed pO2 36 on home 4L admitted to ICU on [place] and BiPAP; improved on steroids and discharged on Augmentin 1/21-1/29: Readmitted to [facility] with SOB/fever, this time found to have pancytopenia platelets 50K; treated with cefepime x9 days and discharged again home with [facility] on prednisone 2/5: Readmitted for respiratory distress, COVID positive with acute PE  2/6 CCM consulted, signed off 2/7: Echo shows stable cor pulmonale and pHTN, RSVP 95.6 (Jan 2022 was in 60s) 2/8: DVT study shows L gastroc DVT 2/9: Switched to apixaban, weaned to 6L at rest, but still 10L with ambulation 2/10:  on 5L [place] at rest. PCCM called back due to hypoxemia worse than baseline and needing 10L [place] at rest. Patient says he is not doing well. Has lost weight and progressive hypoxemia. Chart review 2/11 6L [place] rest.. COVID PCR  Positive but IgG negative (On Rituxan x many years, last dose dec 2023).  On remedesivir. 2/12 no acute changed overnight remains on 6L HFNC  Interim History / Subjective:  Seen lying in bed with no acute complaints, states he is still discussing plan of care with palliative   Objective   Blood pressure 118/85, pulse 81, temperature 98.3 F (36.8 C), temperature source Oral, resp. rate 19, height 5' 6"$  (1.676 m), weight 64.4 kg, SpO2 97 %.        Intake/Output Summary (Last 24 hours) at 12/14/2022 [id number] Last data filed at 12/14/2022 [id number] Gross per 24 hour  Intake 1039.48 ml  Output 1150 ml  Net -110.52 ml    Filed Weights   12/12/22 0246 12/13/22 0026 12/14/22 0600  Weight: 63.1 kg 62.7 kg 64.4 kg   Physical Exam  General: Acute on chronically ill appearing elderly male lying in bed, in NAD HEENT: [place]/AT, MM pink/moist, PERRL,  Neuro: Alert and oriented x3, non-focal , HOH CV: s1s2 regular rate and rhythm, no murmur, rubs, or gallops,  PULM:  Diminished breath sounds bilaterally, no increased work of breathing, on 6L [place] GI: soft, bowel sounds active in all 4 quadrants, non-tender, non-distended, tolerating oral deit Extremities: warm/dry, no edema  Skin: no rashes or lesions  Resolved Hospital Problem list     Assessment & Plan:  Segmental but Submassive PE -CTA showing PE b/l inferior basilar posterior segmental PE, RV strain; RV/LV 1.8 consistent w/ submassive. REcent risk factors for PE are  2 x admits and COVID Jan 2024 -As of 2/11 pt remains on eliqus but still having significant evidence of RV strain (high bnp, trop leak and lactate), IR consulted but clots to peripheral to remove  COVID-19 - at admit +ve (Low grade infectioin with IgG negative due to Rituxan) RA-ILD - baseline not on antifbirotic due to intoelrance -At risk for worsening due to covid. WHO-3 grop 3 PAH -Baseline RSVP 60s in 2022 Acute cor pulmonale now due to PE - RSVP 90s Mild COPD P: Continue  Remdesivir  Supplemental oxygen for SpO2 goal > 90 Encourage pulmonary hygiene  Mobilize as able  Remains on steroids, will discuss with attending timing for taper  Continue Tyvaso  Continue Eliquis  PRN cough suppressants  Continue BDs Ongoing GOC discussion given tenuous prognosis   Best Practice (right click and "Reselect all SmartList Selections" daily)  Per primary  SIGNATURE  Keyarah Mcroy D. Harris, NP-C [place] [facility] Personal contact information can be found on Amion  If no contact or response made please call [phone] 12/14/2022, 9:02 AM

## 2022-12-14 NOTE — TOC Progression Note (Signed)
Transition of Care [facility]) - Progression Note    Patient Details  Name: Rodriques Theroux MRN: [id number] Date of Birth: [birth year]  Transition of Care [facility]) CM/SW Contact  Zenon Mayo, RN Phone Number: 12/14/2022, 5:27 PM  Clinical Narrative:    persistent cough, on 8 liters, lactic is elevated, hgb is 7.6 this am. TOC following.   Expected Discharge Plan: [place] Barriers to Discharge: Continued Medical Work up  Expected Discharge Plan and Services In-house Referral: NA Discharge Planning Services: CM Consult Post Acute Care Choice: [place] arrangements for the past 2 months: Single Family Home                 DME Arranged: N/A DME Agency: NA       HH Arranged: PT HH Agency: [place] Date [place]: 12/08/22 Time [place]: [id number] Representative spoke with at [place]: [place] Determinants of Health ([place]) Interventions SDOH Screenings   Food Insecurity: No Food Insecurity (06/01/2019)  Housing: Low Risk  (06/01/2019)  Depression (PHQ2-9): Low Risk  (06/01/2019)  Financial Resource Strain: Low Risk  (06/01/2019)  Physical Activity: Inactive (06/01/2019)  Stress: No Stress Concern Present (06/01/2019)  Tobacco Use: Medium Risk (10/23/2022)    Readmission Risk Interventions    12/08/2022    3:54 PM  Readmission Risk Prevention Plan  Transportation Screening Complete  PCP or Specialist Appt within 3-5 Days Complete  HRI or [place] Complete  Social Work Consult for [place] Planning/Counseling Complete  Palliative Care Screening Not Applicable  Medication Review Press photographer) Complete

## 2022-12-14 NOTE — Progress Notes (Signed)
IR Procedure request - PE [place]  77 y.o. male inpatient. History of HTN, DM, hyporthyroidism. Presented to the ED on 2.6.24 with respiratory distress and congested cough. Found to be COVID + ( PCR, IgG negative) with saddle PE with right heart strain and interstitial lung disease in rheumatoid arthritis. CT Angio from 2.6.24 reads  Bilateral inferior basilar posterior segmental pulmonary emboli. Examination is POSITIVE for acute PE with CT evidence of right heart strain (RV/LV Ratio = 1.8) consistent with at least submassive (intermediate risk) PE.  Patient is on eliquis. Echo shows high RSVP. Allergies include Ofev, Bactrim.   Case reviewed by IR Attendings Dr. Wanda Plump Mir and Dr. Rolla Plate. After review of imaging and pertinent information it is thought that the RV/LV ratio is related to the Patient's  pre-existing cardiopulmonary disease more so then his PE. Given the minimal clot burden there would be little therapeutic value in  catheter directed intervention. Recommend team to medically manage. Should Patient condition change please consult IR.

## 2022-12-14 NOTE — Progress Notes (Signed)
Physical Therapy Treatment Patient Details Name: [name] MRN: [id number] DOB: 01-Jul-1946 Today's Date: 12/14/2022   History of Present Illness 77 year old male presented to ED 2/6 with c/o shortness of breath and SpO2 dropping to the 70s on his 4L O2 baseline. CT angiogram of chest which showed bilateral peripheral pulmonary emboli, with right heart strain. Admitted for treatment of COPD exacerbation with hypoxia. PMH: COPD, type 2 diabetes mellitus, emphysema, hypertension, RA, osteoarthritis and hypothyroidism, pHTN on Tyvaso and chronic respiratory failure on 4-5L at home, diagnosis of COVID in January    PT Comments    Pt admitted with above diagnosis. Pt was able to ambulate with RW in room however ambulation was interupted by need for BM.  Pt fatigues quickly therefore after BM, pt requesting to sit up in chair and not ambulate further.  Pt also needed O2 incr from 8L HFNC to 10L for ambulation for sats >88%.  Will continue to follow acutely.  Pt currently with functional limitations due to the deficits listed below (see PT Problem List). Pt will benefit from skilled PT to increase their independence and safety with mobility to allow discharge to the venue listed below.      Recommendations for follow up therapy are one component of a multi-disciplinary discharge planning process, led by the attending physician.  Recommendations may be updated based on patient status, additional functional criteria and insurance authorization.  Follow Up Recommendations  Home health PT (resume with current agency)     Assistance Recommended at Discharge Intermittent Supervision/Assistance  Patient can return home with the following A little help with walking and/or transfers;A little help with bathing/dressing/bathroom;Assistance with cooking/housework;Direct supervision/assist for medications management;Direct supervision/assist for financial management;Assist for transportation;Help with stairs or ramp  for entrance   Equipment Recommendations  Wheelchair (measurements PT);Wheelchair cushion (measurements PT)    Recommendations for Other Services OT consult     Precautions / Restrictions Precautions Precautions: Fall Restrictions Weight Bearing Restrictions: No     Mobility  Bed Mobility Overal bed mobility: Modified Independent             General bed mobility comments: uses bed rail to pull to the EoB and bring trunk to upright,    Transfers Overall transfer level: Needs assistance Equipment used: Rolling walker (2 wheels) Transfers: Sit to/from Stand, Bed to chair/wheelchair/BSC Sit to Stand: Min guard           General transfer comment: min guard for power up and steadying    Ambulation/Gait Ambulation/Gait assistance: Min assist, Min guard Gait Distance (Feet): 15 Feet Assistive device: Rolling walker (2 wheels) Gait Pattern/deviations: Step-through pattern, Decreased step length - right, Decreased step length - left, Trunk flexed Gait velocity: slowed Gait velocity interpretation: <1.31 ft/sec, indicative of household ambulator   General Gait Details: min A for steadying and managment of lines and leads to ambulate from bed to door and pt turned to head back to  recliner however stated he needed to have BM therefore brought 3N1 to pt and pt had BM. Pt needed assist with cleaning and then took 4 more steps to recliner to stay up in chair. pt with 3/4 DoE with ambulationPt was on HFNC 8LO2 at rest with sats low 90's.  Pt desat on 8L with short distance ambulation therefore needed 10 L to keep sats >85%.  Able to place pt back on 8L at end of treatement.   Stairs             Emergency planning/management officer  Modified Rankin (Stroke Patients Only)       Balance Overall balance assessment: Needs assistance Sitting-balance support: Feet supported, No upper extremity supported Sitting balance-Leahy Scale: Good     Standing balance support: Single extremity  supported, Bilateral upper extremity supported, During functional activity, Reliant on assistive device for balance Standing balance-Leahy Scale: Poor Standing balance comment: requires at least single UE support                            Cognition Arousal/Alertness: Awake/alert Behavior During Therapy: WFL for tasks assessed/performed Overall Cognitive Status: Within Functional Limits for tasks assessed                                          Exercises General Exercises - Lower Extremity Ankle Circles/Pumps: AROM, Both, 5 reps, Seated Long Arc Quad: AROM, Both, 5 reps, Seated Hip Flexion/Marching: AROM, Both, 5 reps, Seated    General Comments        Pertinent Vitals/Pain Pain Assessment Pain Assessment: No/denies pain    Home Living                          Prior Function            PT Goals (current goals can now be found in the care plan section) Acute Rehab PT Goals Patient Stated Goal: breathe better Progress towards PT goals: Progressing toward goals    Frequency    Min 3X/week      PT Plan Current plan remains appropriate    Co-evaluation              AM-PAC PT "6 Clicks" Mobility   Outcome Measure  Help needed turning from your back to your side while in a flat bed without using bedrails?: None Help needed moving from lying on your back to sitting on the side of a flat bed without using bedrails?: None Help needed moving to and from a bed to a chair (including a wheelchair)?: A Little Help needed standing up from a chair using your arms (e.g., wheelchair or bedside chair)?: A Little Help needed to walk in hospital room?: A Little Help needed climbing 3-5 steps with a railing? : A Lot 6 Click Score: 19    End of Session Equipment Utilized During Treatment: Oxygen;Gait belt Activity Tolerance: Patient limited by fatigue;Treatment limited secondary to medical complications (Comment) (increased O2  demand) Patient left: in chair;with call bell/phone within reach;with chair alarm set Nurse Communication: Mobility status;Other (comment) (need to check on O2 saturation) PT Visit Diagnosis: Unsteadiness on feet (R26.81);Muscle weakness (generalized) (M62.81);Difficulty in walking, not elsewhere classified (R26.2)     Time: [id number] PT Time Calculation (min) (ACUTE ONLY): 24 min  Charges:  $Gait Training: 8-22 mins $Therapeutic Exercise: 8-22 mins                     Pheonix Clinkscale M,PT Acute Rehab Services [place] 12/14/2022, 1:32 PM

## 2022-12-14 NOTE — Progress Notes (Signed)
PROGRESS NOTE  [name] [id number] DOB: June 04, 1946 DOA: 12/07/2022 PCP: Merrilee Seashore, MD  Brief History   Mr. Alpern is a 77 y.o. M with RA, ILD on Actemra and Imuran, emphysema and pHTN on Tyvaso and chronic respiratory failure on 4-5L at home, as well as recent COVID complicated by pancytopenia who presented with dyspnea and respiratory distress.      1/5-1/12: Admitted to [facility] for COVID, ABG showed pO2 36 on home 4L admitted to ICU on [place] and BiPAP; improved on steroids and discharged on Augmentin  1/21-1/29: Readmitted to [facility] with SOB/fever, this time found to have pancytopenia platelets 50K; treated with cefepime x9 days and discharged again home with [facility] on prednisone   2/5: Readmitted for respiratory distress 2/6: CCM consulted, recommended heparin, steroids 2/7: Echo shows stable cor pulmonale and pHTN 2/8: DVT study shows L gastroc DVT 2/9: Switched to apixaban, weaned to 6L at rest, but still 10L with ambulation 2/10: Still on 12L O2 with ambulation, Pulmonary consulted 12/14/2022: Patient is requiring 5L O2 by [place].  Consultants  Pulmonary Critical Care Palliative care  Procedures  None  Antibiotics   Anti-infectives (From admission, onward)    Start     Dose/Rate Route Frequency Ordered Stop   12/13/22 1000  remdesivir 100 mg in sodium chloride 0.9 % 100 mL IVPB       Note to Pharmacy: Please message Dr. Brand Males with questions  See Hyperspace for full Linked Orders Report.   100 mg 200 mL/hr over 30 Minutes Intravenous Daily 12/12/22 1436 12/27/22 0959   12/12/22 1530  remdesivir 200 mg in sodium chloride 0.9% 250 mL IVPB       See Hyperspace for full Linked Orders Report.   200 mg 580 mL/hr over 30 Minutes Intravenous Once 12/12/22 1436 12/12/22 2158   12/11/22 1700  cefTRIAXone (ROCEPHIN) 2 g in sodium chloride 0.9 % 100 mL IVPB        2 g 200 mL/hr over 30 Minutes Intravenous Every 24 hours 12/11/22 1601 12/12/22 1859    12/08/22 1000  nirmatrelvir/ritonavir (PAXLOVID) 3 tablet  Status:  Discontinued        3 tablet Oral 2 times daily 12/08/22 0403 12/10/22 1646   12/08/22 0800  cefTRIAXone (ROCEPHIN) 2 g in sodium chloride 0.9 % 100 mL IVPB  Status:  Discontinued        2 g 200 mL/hr over 30 Minutes Intravenous Daily 12/08/22 0611 12/10/22 1646      Subjective  The patient is resting comfortably. No new complaints.  Objective   Vitals:  Vitals:   12/13/22 2213 12/14/22 0600  BP: 105/67 118/85  Pulse: 72 81  Resp: 20 19  Temp: 98.2 F (36.8 C) 98.3 F (36.8 C)  SpO2: 98% 97%    Exam:  Constitutional:  Appears calm and comfortable Respiratory:  CTA bilaterally, no w/r/r.  Respiratory effort normal. No retractions or accessory muscle use Cardiovascular:  RRR, no m/r/g No LE extremity edema   Normal pedal pulses Abdomen:  Abdomen appears normal; no tenderness or masses No hernias No HSM Musculoskeletal:  Digits/nails BUE: no clubbing, cyanosis, petechiae, infection exam of joints, bones, muscles of at least one of following: head/neck, RUE, LUE, RLE, LLE   strength and tone normal, no atrophy, no abnormal movements No tenderness, masses Normal ROM, no contractures  gait and station Skin:  No rashes, lesions, ulcers palpation of skin: no induration or nodules Neurologic:  CN 2-12 intact Sensation all 4 extremities  intact Psychiatric:  Mental status Mood, affect appropriate Orientation to person, place, time  judgment and insight appear intact     I have personally reviewed the following:   Today's Data   Today's Vitals   12/13/22 2213 12/14/22 0600 12/14/22 1000 12/14/22 1100  BP: 105/67 118/85    Pulse: 72 81    Resp: 20 19    Temp: 98.2 F (36.8 C) 98.3 F (36.8 C)    TempSrc: Oral Oral    SpO2: 98% 97%    Weight:  64.4 kg    Height:      PainSc: 0-No pain  0-No pain 0-No pain   Body mass index is 22.92 kg/m.   Lab Data  CBC    Component Value  Date/Time   WBC 1.8 (L) 12/14/2022 0104   RBC 2.80 (L) 12/14/2022 0104   HGB 7.6 (L) 12/14/2022 0104   HGB 12.2 (L) 05/09/2020 1057   HCT 24.3 (L) 12/14/2022 0104   HCT 38.1 05/09/2020 1057   PLT 61 (L) 12/14/2022 0104   PLT 270 05/09/2020 1057   MCV 86.8 12/14/2022 0104   MCV 96 05/09/2020 1057   MCH 27.1 12/14/2022 0104   MCHC 31.3 12/14/2022 0104   RDW 21.7 (H) 12/14/2022 0104   RDW 16.9 (H) 05/09/2020 1057   LYMPHSABS 0.1 (L) 12/11/2022 0048   LYMPHSABS 0.5 (L) 05/09/2020 1057   MONOABS 0.1 12/11/2022 0048   EOSABS 0.0 12/11/2022 0048   EOSABS 0.3 05/09/2020 1057   BASOSABS 0.0 12/11/2022 0048   BASOSABS 0.0 05/09/2020 1057      Latest Ref Rng & Units 12/14/2022    1:04 AM 12/13/2022   12:55 AM 12/12/2022   12:36 AM  BMP  Glucose 70 - 99 mg/dL 93  36  139   BUN 8 - 23 mg/dL 19  18  22   $ Creatinine 0.61 - 1.24 mg/dL 0.80  0.95  1.12   Sodium 135 - 145 mmol/L 141  143  140   Potassium 3.5 - 5.1 mmol/L 3.8  3.3  3.8   Chloride 98 - 111 mmol/L 100  100  99   CO2 22 - 32 mmol/L 31  31  28   $ Calcium 8.9 - 10.3 mg/dL 7.9  8.3  7.9      Micro Data   Results for orders placed or performed during the hospital encounter of 12/07/22  Resp panel by RT-PCR (RSV, Flu A&B, Covid) Anterior Nasal Swab     Status: Abnormal   Collection Time: 12/07/22  8:32 PM   Specimen: Anterior Nasal Swab  Result Value Ref Range Status   SARS Coronavirus 2 by RT PCR POSITIVE (A) NEGATIVE Final   Influenza A by PCR NEGATIVE NEGATIVE Final   Influenza B by PCR NEGATIVE NEGATIVE Final    Comment: (NOTE) The Xpert Xpress SARS-CoV-2/FLU/RSV plus assay is intended as an aid in the diagnosis of influenza from Nasopharyngeal swab specimens and should not be used as a sole basis for treatment. Nasal washings and aspirates are unacceptable for Xpert Xpress SARS-CoV-2/FLU/RSV testing.  Fact Sheet for Patients: EntrepreneurPulse.com.au  Fact Sheet for Healthcare  Providers: IncredibleEmployment.be  This test is not yet approved or cleared by the [place] FDA and has been authorized for detection and/or diagnosis of SARS-CoV-2 by FDA under an Emergency Use Authorization (EUA). This EUA will remain in effect (meaning this test can be used) for the duration of the COVID-19 declaration under Section 564(b)(1) of the Act, 21 U.S.C.  section 360bbb-3(b)(1), unless the authorization is terminated or revoked.     Resp Syncytial Virus by PCR NEGATIVE NEGATIVE Final    Comment: (NOTE) Fact Sheet for Patients: EntrepreneurPulse.com.au  Fact Sheet for Healthcare Providers: IncredibleEmployment.be  This test is not yet approved or cleared by the [place] FDA and has been authorized for detection and/or diagnosis of SARS-CoV-2 by FDA under an Emergency Use Authorization (EUA). This EUA will remain in effect (meaning this test can be used) for the duration of the COVID-19 declaration under Section 564(b)(1) of the Act, 21 U.S.C. section 360bbb-3(b)(1), unless the authorization is terminated or revoked.  Performed at [facility], [place] [address] [place], [place] [address]     Scheduled Meds:  apixaban  5 mg Oral BID   vitamin C  500 mg Oral Daily   famotidine  20 mg Oral BID   finasteride  5 mg Oral Daily   fluticasone  2 spray Each Nare Daily   folic acid  [id number] mcg Oral Daily   insulin aspart  0-15 Units Subcutaneous TID WC   insulin aspart  0-5 Units Subcutaneous QHS   insulin aspart  3 Units Subcutaneous TID WC   levothyroxine  25 mcg Oral Q0600   pantoprazole  40 mg Oral Daily   predniSONE  50 mg Oral Daily   Treprostinil  48 mcg Inhalation 4 times per day   zinc sulfate  220 mg Oral Daily   Continuous Infusions:  lactated ringers 1,000 mL with potassium chloride 20 mEq infusion 75 mL/hr at 12/13/22 [id number]   remdesivir 100 mg in sodium chloride 0.9 % 100 mL IVPB 100 mg  (12/14/22 1006)    Principal Problem:   Acute and chronic respiratory failure with hypoxia (HCC) Active Problems:   COVID-19 virus infection   Acute pulmonary embolism (HCC)   Pulmonary hypertension ([place])   COPD with acute exacerbation (HCC)   Pancytopenia (HCC)   Lobar pneumonia (HCC)   Thrombocytopenia (HCC)   Rheumatoid arthritis ([place])   Hypothyroidism   Dyslipidemia   Essential hypertension   Hypomagnesemia   Hypokalemia   Elevated troponin I level   Type 2 diabetes mellitus (HCC)   Pressure injury of skin   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   LOS: 6 days   A & P  Acute respiratory failure with hypoxia ([place]) - This is Secondary to COPD acute exacerbation. - He will be to admitted to a progressive unit bed. - We will taper BiPAP as tolerated. - Management otherwise as below.  COPD with acute exacerbation (HCC) - Continue steroids. - Continue bronchodilators.    COVID-19 virus infection Tested positive in Nov 06, 2022.  Given he is on Rituxan, have to consider ongoing COVID infection. - Start remdesivir - Airborne - Send COVID IGg Abs  Dyslipidemia - Hold lipitor  Essential hypertension - Hold amlodipine given soft BP  Hypomagnesemia Supplement and monitor.  Hypokalemia Supplement and monitor.  Elevated troponin I level Likely myocardial injury due to COVID, ischemia ruled out  Type 2 diabetes mellitus (HCC) Glucose controlled - Hold metformin and NPH - Continue SS corrections  Acute and chronic respiratory failure with hypoxia (HCC) Presented with hypoxia requiring BiPAP and nonrebreather.  Discussed with Pulmonology, Dr. Chase Caller, likely from PE in setting of severe ILD and pHTN.  With his Rituxan, Pulm think we have to also consider inability to mount response, and possibly ongoing COVID .   Currently requiring 5L O2 by nasal cannula.  Acute pulmonary embolism (HCC) - Continue apixaban - Consulted IR for thrombectomy without tPA - The  patient is not a candidate for catheter directed thrombectomy.  Thrombocytopenia ([place]) See below  Rheumatoid arthritis ([place]) - Hold Actemra, Imuran - Continue steroids  Pressure injury of skin  Sacrum, stage II, present on admission. Wound care consulted.  Pulmonary hypertension ([place]) Echo shows severe pulmonary hypertension and TR, estimated pressures may be higher than last echo.  Got Lasix 2/9 - Consult Pulmonology appreciate cares. - Continue Treprostinil.   Hypothyroidism - Continue levothyroxine  Pancytopenia ([place]) This first was noted mid Jan during admission at [facility], again WBC <4, Hgb <8, and platelets <50.  Suspect bone marrow suppression from COVID, in setting of Imuran  B12, folate, smear unremarkable.  Retics hypoproliferative. - Hold Imuran now and at discharge - Follow up with Hematology and Rheumatology and Pulmonology on discharge.  Lobar pneumonia ([place]) CT chest showed RIGHT basilar basilar dependent consolidation suspicious for pneumonia.   - Continue Rocephin, day 6 - Continue aggressive pulmonary toilet  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 - Continue steroids - Hold Imuran - Pulmonology consult is appreciated.  DVT prophylaxis: SCD's Code Status: DNR Family Communication: None available Disposition Plan: Home    Thomas Rhude, DO [facility] Direct contact: see [email or web address]  7PM-7AM contact night coverage as above 12/14/2022, 5:47 PM  LOS: 6 days

## 2022-12-15 ENCOUNTER — Telehealth: Payer: Self-pay | Admitting: Internal Medicine

## 2022-12-15 DIAGNOSIS — J9621 Acute and chronic respiratory failure with hypoxia: Secondary | ICD-10-CM | POA: Diagnosis not present

## 2022-12-15 LAB — GLUCOSE, CAPILLARY
Glucose-Capillary: 142 mg/dL — ABNORMAL HIGH (ref 70–99)
Glucose-Capillary: 174 mg/dL — ABNORMAL HIGH (ref 70–99)
Glucose-Capillary: 165 mg/dL — ABNORMAL HIGH (ref 70–99)
Glucose-Capillary: 161 mg/dL — ABNORMAL HIGH (ref 70–99)

## 2022-12-15 LAB — CBC WITH DIFFERENTIAL/PLATELET
Abs Immature Granulocytes: 0.1 10*3/uL — ABNORMAL HIGH (ref 0.00–0.07)
Basophils Absolute: 0 10*3/uL (ref 0.0–0.1)
Basophils Relative: 1 %
Eosinophils Absolute: 0.1 10*3/uL (ref 0.0–0.5)
Eosinophils Relative: 6 %
HCT: 29 % — ABNORMAL LOW (ref 39.0–52.0)
Hemoglobin: 9.1 g/dL — ABNORMAL LOW (ref 13.0–17.0)
Immature Granulocytes: 6 %
Lymphocytes Relative: 10 %
Lymphs Abs: 0.2 10*3/uL — ABNORMAL LOW (ref 0.7–4.0)
MCH: 27 pg (ref 26.0–34.0)
MCHC: 31.4 g/dL (ref 30.0–36.0)
MCV: 86.1 fL (ref 80.0–100.0)
Monocytes Absolute: 0.3 10*3/uL (ref 0.1–1.0)
Monocytes Relative: 15 %
Neutro Abs: 1 10*3/uL — ABNORMAL LOW (ref 1.7–7.7)
Neutrophils Relative %: 62 %
Platelets: 54 10*3/uL — ABNORMAL LOW (ref 150–400)
RBC: 3.37 MIL/uL — ABNORMAL LOW (ref 4.22–5.81)
RDW: 21.7 % — ABNORMAL HIGH (ref 11.5–15.5)
Smear Review: DECREASED
WBC: 1.6 10*3/uL — ABNORMAL LOW (ref 4.0–10.5)
nRBC: 6.7 % — ABNORMAL HIGH (ref 0.0–0.2)

## 2022-12-15 LAB — PATHOLOGIST SMEAR REVIEW

## 2022-12-15 NOTE — Progress Notes (Signed)
PROGRESS NOTE  [name] [id number] DOB: 04-25-1946 DOA: 12/07/2022 PCP: Merrilee Seashore, MD  Brief History   [name] is a 77 y.o. M with RA, ILD on Actemra and Imuran, emphysema and pHTN on Tyvaso and chronic respiratory failure on 4-5L at home, as well as recent COVID complicated by pancytopenia who presented with dyspnea and respiratory distress.      1/5-1/12: Admitted to [facility] for COVID, ABG showed pO2 36 on home 4L admitted to ICU on [place] and BiPAP; improved on steroids and discharged on Augmentin  1/21-1/29: Readmitted to [facility] with SOB/fever, this time found to have pancytopenia platelets 50K; treated with cefepime x9 days and discharged again home with [facility] on prednisone   2/5: Readmitted for respiratory distress 2/6: CCM consulted, recommended heparin, steroids 2/7: Echo shows stable cor pulmonale and pHTN 2/8: DVT study shows L gastroc DVT 2/9: Switched to apixaban, weaned to 6L at rest, but still 10L with ambulation 2/10: Still on 12L O2 with ambulation, Pulmonary consulted 12/14/2022: Patient is requiring 5L O2 by [place]. 12/15/2022: The patient's oxygen requirements have increased to 8L. Pulmonary has signed off.   Consultants  Pulmonary Critical Care Palliative care  Procedures  None  Antibiotics   Anti-infectives (From admission, onward)    Start     Dose/Rate Route Frequency Ordered Stop   12/13/22 1000  remdesivir 100 mg in sodium chloride 0.9 % 100 mL IVPB       Note to Pharmacy: Please message Dr. Brand Males with questions  See Hyperspace for full Linked Orders Report.   100 mg 200 mL/hr over 30 Minutes Intravenous Daily 12/12/22 1436 12/27/22 0959   12/12/22 1530  remdesivir 200 mg in sodium chloride 0.9% 250 mL IVPB       See Hyperspace for full Linked Orders Report.   200 mg 580 mL/hr over 30 Minutes Intravenous Once 12/12/22 1436 12/12/22 2158   12/11/22 1700  cefTRIAXone (ROCEPHIN) 2 g in sodium chloride 0.9 % 100 mL IVPB         2 g 200 mL/hr over 30 Minutes Intravenous Every 24 hours 12/11/22 1601 12/12/22 1859   12/08/22 1000  nirmatrelvir/ritonavir (PAXLOVID) 3 tablet  Status:  Discontinued        3 tablet Oral 2 times daily 12/08/22 0403 12/10/22 1646   12/08/22 0800  cefTRIAXone (ROCEPHIN) 2 g in sodium chloride 0.9 % 100 mL IVPB  Status:  Discontinued        2 g 200 mL/hr over 30 Minutes Intravenous Daily 12/08/22 0611 12/10/22 1646      Subjective  The patient is resting comfortably. No new complaints.  Objective   Vitals:  Vitals:   12/13/22 2213 12/14/22 0600  BP: 105/67 118/85  Pulse: 72 81  Resp: 20 19  Temp: 98.2 F (36.8 C) 98.3 F (36.8 C)  SpO2: 98% 97%    Exam:  Constitutional:  Appears calm and comfortable Respiratory:  CTA bilaterally, no w/r/r.  Respiratory effort normal. No retractions or accessory muscle use Cardiovascular:  RRR, no m/r/g No LE extremity edema   Normal pedal pulses Abdomen:  Abdomen appears normal; no tenderness or masses No hernias No HSM Musculoskeletal:  Digits/nails BUE: no clubbing, cyanosis, petechiae, infection exam of joints, bones, muscles of at least one of following: head/neck, RUE, LUE, RLE, LLE   strength and tone normal, no atrophy, no abnormal movements No tenderness, masses Normal ROM, no contractures  gait and station Skin:  No rashes, lesions, ulcers palpation of  skin: no induration or nodules Neurologic:  CN 2-12 intact Sensation all 4 extremities intact Psychiatric:  Mental status Mood, affect appropriate Orientation to person, place, time  judgment and insight appear intact     I have personally reviewed the following:   Today's Data   Today's Vitals   12/13/22 2213 12/14/22 0600 12/14/22 1000 12/14/22 1100  BP: 105/67 118/85    Pulse: 72 81    Resp: 20 19    Temp: 98.2 F (36.8 C) 98.3 F (36.8 C)    TempSrc: Oral Oral    SpO2: 98% 97%    Weight:  64.4 kg    Height:      PainSc: 0-No pain  0-No pain  0-No pain   Body mass index is 22.92 kg/m.   Lab Data  CBC    Component Value Date/Time   WBC 1.8 (L) 12/14/2022 0104   RBC 2.80 (L) 12/14/2022 0104   HGB 7.6 (L) 12/14/2022 0104   HGB 12.2 (L) 05/09/2020 1057   HCT 24.3 (L) 12/14/2022 0104   HCT 38.1 05/09/2020 1057   PLT 61 (L) 12/14/2022 0104   PLT 270 05/09/2020 1057   MCV 86.8 12/14/2022 0104   MCV 96 05/09/2020 1057   MCH 27.1 12/14/2022 0104   MCHC 31.3 12/14/2022 0104   RDW 21.7 (H) 12/14/2022 0104   RDW 16.9 (H) 05/09/2020 1057   LYMPHSABS 0.1 (L) 12/11/2022 0048   LYMPHSABS 0.5 (L) 05/09/2020 1057   MONOABS 0.1 12/11/2022 0048   EOSABS 0.0 12/11/2022 0048   EOSABS 0.3 05/09/2020 1057   BASOSABS 0.0 12/11/2022 0048   BASOSABS 0.0 05/09/2020 1057      Latest Ref Rng & Units 12/14/2022    1:04 AM 12/13/2022   12:55 AM 12/12/2022   12:36 AM  BMP  Glucose 70 - 99 mg/dL 93  36  139   BUN 8 - 23 mg/dL 19  18  22   $ Creatinine 0.61 - 1.24 mg/dL 0.80  0.95  1.12   Sodium 135 - 145 mmol/L 141  143  140   Potassium 3.5 - 5.1 mmol/L 3.8  3.3  3.8   Chloride 98 - 111 mmol/L 100  100  99   CO2 22 - 32 mmol/L 31  31  28   $ Calcium 8.9 - 10.3 mg/dL 7.9  8.3  7.9      Micro Data   Results for orders placed or performed during the hospital encounter of 12/07/22  Resp panel by RT-PCR (RSV, Flu A&B, Covid) Anterior Nasal Swab     Status: Abnormal   Collection Time: 12/07/22  8:32 PM   Specimen: Anterior Nasal Swab  Result Value Ref Range Status   SARS Coronavirus 2 by RT PCR POSITIVE (A) NEGATIVE Final   Influenza A by PCR NEGATIVE NEGATIVE Final   Influenza B by PCR NEGATIVE NEGATIVE Final    Comment: (NOTE) The Xpert Xpress SARS-CoV-2/FLU/RSV plus assay is intended as an aid in the diagnosis of influenza from Nasopharyngeal swab specimens and should not be used as a sole basis for treatment. Nasal washings and aspirates are unacceptable for Xpert Xpress SARS-CoV-2/FLU/RSV testing.  Fact Sheet for  Patients: EntrepreneurPulse.com.au  Fact Sheet for Healthcare Providers: IncredibleEmployment.be  This test is not yet approved or cleared by the [place] FDA and has been authorized for detection and/or diagnosis of SARS-CoV-2 by FDA under an Emergency Use Authorization (EUA). This EUA will remain in effect (meaning this test can be used) for  the duration of the COVID-19 declaration under Section 564(b)(1) of the Act, 21 U.S.C. section 360bbb-3(b)(1), unless the authorization is terminated or revoked.     Resp Syncytial Virus by PCR NEGATIVE NEGATIVE Final    Comment: (NOTE) Fact Sheet for Patients: EntrepreneurPulse.com.au  Fact Sheet for Healthcare Providers: IncredibleEmployment.be  This test is not yet approved or cleared by the [place] FDA and has been authorized for detection and/or diagnosis of SARS-CoV-2 by FDA under an Emergency Use Authorization (EUA). This EUA will remain in effect (meaning this test can be used) for the duration of the COVID-19 declaration under Section 564(b)(1) of the Act, 21 U.S.C. section 360bbb-3(b)(1), unless the authorization is terminated or revoked.  Performed at [facility], [place] [address], [place], [place] [address]     Scheduled Meds:  apixaban  5 mg Oral BID   vitamin C  500 mg Oral Daily   famotidine  20 mg Oral BID   finasteride  5 mg Oral Daily   fluticasone  2 spray Each Nare Daily   folic acid  [id number] mcg Oral Daily   insulin aspart  0-15 Units Subcutaneous TID WC   insulin aspart  0-5 Units Subcutaneous QHS   insulin aspart  3 Units Subcutaneous TID WC   levothyroxine  25 mcg Oral Q0600   pantoprazole  40 mg Oral Daily   predniSONE  50 mg Oral Daily   Treprostinil  48 mcg Inhalation 4 times per day   zinc sulfate  220 mg Oral Daily   Continuous Infusions:  lactated ringers 1,000 mL with potassium chloride 20 mEq infusion 75 mL/hr at  12/13/22 [id number]   remdesivir 100 mg in sodium chloride 0.9 % 100 mL IVPB 100 mg (12/14/22 1006)    Principal Problem:   Acute and chronic respiratory failure with hypoxia (HCC) Active Problems:   COVID-19 virus infection   Acute pulmonary embolism (HCC)   Pulmonary hypertension ([place])   COPD with acute exacerbation (HCC)   Pancytopenia (HCC)   Lobar pneumonia (HCC)   Thrombocytopenia (HCC)   Rheumatoid arthritis ([place])   Hypothyroidism   Dyslipidemia   Essential hypertension   Hypomagnesemia   Hypokalemia   Elevated troponin I level   Type 2 diabetes mellitus (HCC)   Pressure injury of skin   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   LOS: 6 days   A & P  Acute respiratory failure with hypoxia ([place]) - This is Secondary to COPD acute exacerbation. - He will be to admitted to a progressive unit bed. - We will taper BiPAP as tolerated. - Management otherwise as below.  COPD with acute exacerbation (HCC) - Continue steroids. - Continue bronchodilators.    COVID-19 virus infection Tested positive in Nov 06, 2022.  Given he is on Rituxan, have to consider ongoing COVID infection. - Start remdesivir - Airborne - Send COVID IGg Abs  Dyslipidemia - Hold lipitor  Essential hypertension - Hold amlodipine given soft BP  Hypomagnesemia Supplement and monitor.  Hypokalemia Supplement and monitor.  Elevated troponin I level Likely myocardial injury due to COVID, ischemia ruled out  Type 2 diabetes mellitus (HCC) Glucose controlled - Hold metformin and NPH - Continue SS corrections  Acute and chronic respiratory failure with hypoxia (HCC) Presented with hypoxia requiring BiPAP and nonrebreather.  Discussed with Pulmonology, Dr. Chase Caller, likely from PE in setting of severe ILD and pHTN.  With his Rituxan, Pulm think we have to also consider inability to mount response, and possibly  ongoing COVID .   Currently requiring 5L O2 by nasal cannula.   Acute pulmonary embolism  (HCC) - Continue apixaban - Consulted IR for thrombectomy without tPA - The patient is not a candidate for catheter directed thrombectomy.  Thrombocytopenia ([place]) See below  Rheumatoid arthritis ([place]) - Hold Actemra, Imuran - Continue steroids  Pressure injury of skin  Sacrum, stage II, present on admission. Wound care consulted.  Pulmonary hypertension ([place]) Echo shows severe pulmonary hypertension and TR, estimated pressures may be higher than last echo.  Got Lasix 2/9 - Consult Pulmonology appreciate cares. - Continue Treprostinil.   Hypothyroidism - Continue levothyroxine  Pancytopenia ([place]) This first was noted mid Jan during admission at [facility], again WBC <4, Hgb <8, and platelets <50.  Suspect bone marrow suppression from COVID, in setting of Imuran  B12, folate, smear unremarkable.  Retics hypoproliferative. - Hold Imuran now and at discharge - Follow up with Hematology and Rheumatology and Pulmonology on discharge.  Lobar pneumonia ([place]) CT chest showed RIGHT basilar basilar dependent consolidation suspicious for pneumonia.   - Continue Rocephin, day 6 - Continue aggressive pulmonary toilet  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 - Continue steroids - Hold Imuran - Pulmonology consult is appreciated.  DVT prophylaxis: SCD's Code Status: DNR Family Communication: None available Disposition Plan: Home    Damare Serano, DO [facility] Direct contact: see [email or web address]  7PM-7AM contact night coverage as above 12/15/2022, 5:10 PM  LOS: 7 days

## 2022-12-15 NOTE — Progress Notes (Signed)
Mobility Specialist - Progress Note   12/15/22 1000  Mobility  Activity Ambulated with assistance in room  Level of Assistance Standby assist, set-up cues, supervision of patient - no hands on  Assistive Device Four wheel walker  Distance Ambulated (ft) 20 ft  Activity Response Tolerated well  $Mobility charge 1 Mobility    Pt received in bed agreeable to mobility. Once standing EOB, pt requested to use BR. Had BM then returned to bed d/t fatigue. Remained on 8L O2 throughout session. Left in bed w/ bed alarm on and call bell in his lap.   Pre-mobility: SpO2 99% During mobility: SpO2 90% Post-mobility: SpO2 87%  [place] Specialist Please contact via Solicitor or Rehab office at [phone]

## 2022-12-15 NOTE — Telephone Encounter (Signed)
PT is already on schedule to see MR on 2/22

## 2022-12-15 NOTE — Progress Notes (Signed)
Physical Therapy Treatment Patient Details Name: [name] MRN: [id number] DOB: 08/18/1946 Today's Date: 12/15/2022   History of Present Illness 77 year old male presented to ED 2/6 with c/o shortness of breath and SpO2 dropping to the 70s on his 4L O2 baseline. CT angiogram of chest which showed bilateral peripheral pulmonary emboli, with right heart strain. Admitted for treatment of COPD exacerbation with hypoxia. PMH: COPD, type 2 diabetes mellitus, emphysema, hypertension, RA, osteoarthritis and hypothyroidism, pHTN on Tyvaso and chronic respiratory failure on 4-5L at home, diagnosis of COVID in January    PT Comments     Pt greeted semi-reclined in bed and agreeable to session with incremental progress towards acute goals. Pt continues to be limited by fatigue, decreased cardiopulmonary endurance, and general weakness. Pt able to progress gait slightly this date while maintaining SpO2 >90% on 8L, however once pt returned to sitting SpO2 dropping to low 80s needing increase to 10L to recover to >90%. Pt demonstrating intermittent mouth breathing needing cues for pursed lip breathing and to slow breaths with good return. Pt with fair tolerance for LE therex. Current plan remains appropriate to address deficits and maximize functional independence and decrease caregiver burden. Pt continues to benefit from skilled PT services to progress toward functional mobility goals.    Recommendations for follow up therapy are one component of a multi-disciplinary discharge planning process, led by the attending physician.  Recommendations may be updated based on patient status, additional functional criteria and insurance authorization.  Follow Up Recommendations  Home health PT (resume with current agency)     Assistance Recommended at Discharge Intermittent Supervision/Assistance  Patient can return home with the following A little help with walking and/or transfers;A little help with  bathing/dressing/bathroom;Assistance with cooking/housework;Direct supervision/assist for medications management;Direct supervision/assist for financial management;Assist for transportation;Help with stairs or ramp for entrance   Equipment Recommendations  Wheelchair (measurements PT);Wheelchair cushion (measurements PT)    Recommendations for Other Services       Precautions / Restrictions Precautions Precautions: Fall Restrictions Weight Bearing Restrictions: No     Mobility  Bed Mobility Overal bed mobility: Modified Independent             General bed mobility comments: uses bed rail to pull to the EoB and bring trunk to upright,    Transfers Overall transfer level: Needs assistance Equipment used: Rolling walker (2 wheels), None Transfers: Sit to/from Stand, Bed to chair/wheelchair/BSC Sit to Stand: Min guard           General transfer comment: min guard for power up and steadying, able to complete x5 without UE support at end of session    Ambulation/Gait Ambulation/Gait assistance: Min assist, Min guard Gait Distance (Feet): 30 Feet Assistive device: Rolling walker (2 wheels) Gait Pattern/deviations: Step-through pattern, Decreased step length - right, Decreased step length - left, Trunk flexed Gait velocity: slowed     General Gait Details: min A for steadying and managment of lines and leads to ambulate from bed<>door. pt with 3/4 DoE with ambulation Pt was on HFNC 8LO2 at rest with sats mid 90's.  Pt desat on 8L after short distance ambulation once seated EOB therefore needed 10 L to bring sats back up to >90%.  Able to place pt back on 8L at end of treatement.   Stairs             Wheelchair Mobility    Modified Rankin (Stroke Patients Only)       Balance Overall balance assessment: Needs assistance  Sitting-balance support: Feet supported, No upper extremity supported Sitting balance-Leahy Scale: Good     Standing balance support:  Single extremity supported, Bilateral upper extremity supported, During functional activity, Reliant on assistive device for balance Standing balance-Leahy Scale: Poor Standing balance comment: requires at least single UE support                            Cognition Arousal/Alertness: Awake/alert Behavior During Therapy: WFL for tasks assessed/performed Overall Cognitive Status: Within Functional Limits for tasks assessed                                          Exercises General Exercises - Lower Extremity Long Arc Quad: AROM, Both, Seated, 10 reps Hip Flexion/Marching: AROM, Both, Seated, 20 reps    General Comments        Pertinent Vitals/Pain Pain Assessment Pain Assessment: No/denies pain    Home Living                          Prior Function            PT Goals (current goals can now be found in the care plan section) Acute Rehab PT Goals Patient Stated Goal: breathe better PT Goal Formulation: With patient Time For Goal Achievement: 12/24/22 Progress towards PT goals: Progressing toward goals    Frequency    Min 3X/week      PT Plan Current plan remains appropriate    Co-evaluation              AM-PAC PT "6 Clicks" Mobility   Outcome Measure  Help needed turning from your back to your side while in a flat bed without using bedrails?: None Help needed moving from lying on your back to sitting on the side of a flat bed without using bedrails?: None Help needed moving to and from a bed to a chair (including a wheelchair)?: A Little Help needed standing up from a chair using your arms (e.g., wheelchair or bedside chair)?: A Little Help needed to walk in hospital room?: A Little Help needed climbing 3-5 steps with a railing? : A Lot 6 Click Score: 19    End of Session Equipment Utilized During Treatment: Oxygen;Gait belt Activity Tolerance: Treatment limited secondary to medical complications  (Comment);Patient tolerated treatment well (increased O2 demand) Patient left: in bed;with call bell/phone within reach;with bed alarm set Nurse Communication: Mobility status PT Visit Diagnosis: Unsteadiness on feet (R26.81);Muscle weakness (generalized) (M62.81);Difficulty in walking, not elsewhere classified (R26.2)     Time: [id number] PT Time Calculation (min) (ACUTE ONLY): 23 min  Charges:  $Gait Training: 8-22 mins $Therapeutic Activity: 8-22 mins                     Sharaine Delange R. PTA Acute Rehabilitation Services Office: [place] 12/15/2022, 4:04 PM

## 2022-12-16 DIAGNOSIS — J181 Lobar pneumonia, unspecified organism: Secondary | ICD-10-CM

## 2022-12-16 DIAGNOSIS — I272 Pulmonary hypertension, unspecified: Secondary | ICD-10-CM

## 2022-12-16 DIAGNOSIS — D61818 Other pancytopenia: Secondary | ICD-10-CM

## 2022-12-16 DIAGNOSIS — U071 COVID-19: Secondary | ICD-10-CM | POA: Diagnosis not present

## 2022-12-16 DIAGNOSIS — Z515 Encounter for palliative care: Secondary | ICD-10-CM | POA: Diagnosis not present

## 2022-12-16 DIAGNOSIS — Z7189 Other specified counseling: Secondary | ICD-10-CM | POA: Diagnosis not present

## 2022-12-16 DIAGNOSIS — J9621 Acute and chronic respiratory failure with hypoxia: Secondary | ICD-10-CM | POA: Diagnosis not present

## 2022-12-16 LAB — GLUCOSE, CAPILLARY
Glucose-Capillary: 102 mg/dL — ABNORMAL HIGH (ref 70–99)
Glucose-Capillary: 138 mg/dL — ABNORMAL HIGH (ref 70–99)
Glucose-Capillary: 133 mg/dL — ABNORMAL HIGH (ref 70–99)
Glucose-Capillary: 174 mg/dL — ABNORMAL HIGH (ref 70–99)

## 2022-12-16 MED ORDER — FUROSEMIDE 10 MG/ML IJ SOLN
40.0000 mg | Freq: Once | INTRAMUSCULAR | Status: AC
  Administered 2022-12-16: 40 mg via INTRAVENOUS
  Filled 2022-12-16: qty 4

## 2022-12-16 NOTE — Progress Notes (Signed)
Daily Progress Note   Patient Name: [name]       Date: 12/16/2022 DOB: [birth year]  Age: 77 y.o. MRN#: [id number] Attending Physician: Oswald Hillock, MD Primary Care Physician: Merrilee Seashore, MD Admit Date: 12/07/2022  Reason for Consultation/Follow-up: Establishing goals of care  Subjective: Feeling better Not interested in much communication Ready to go home  Length of Stay: 8  Current Medications: Scheduled Meds:   apixaban  5 mg Oral BID   vitamin C  500 mg Oral Daily   famotidine  20 mg Oral BID   finasteride  5 mg Oral Daily   fluticasone  2 spray Each Nare Daily   folic acid  [id number] mcg Oral Daily   insulin aspart  0-15 Units Subcutaneous TID WC   insulin aspart  0-5 Units Subcutaneous QHS   insulin aspart  3 Units Subcutaneous TID WC   levothyroxine  25 mcg Oral Q0600   pantoprazole  40 mg Oral Daily   predniSONE  50 mg Oral Daily   Treprostinil  48 mcg Inhalation 4 times per day   zinc sulfate  220 mg Oral Daily    Continuous Infusions:  remdesivir 100 mg in sodium chloride 0.9 % 100 mL IVPB 100 mg (12/16/22 0925)    PRN Meds: acetaminophen, chlorpheniramine-HYDROcodone, guaiFENesin-dextromethorphan, Ipratropium-Albuterol, magnesium hydroxide, ondansetron **OR** ondansetron (ZOFRAN) IV, traZODone  Physical Exam Constitutional:      General: [name] not in acute distress.    Appearance: [name] ill-appearing.     Comments: Sitting up in chair  Pulmonary:     Effort: Pulmonary effort is normal.     Comments: Remains on 8L, breathing unlabored Skin:    General: Skin is warm and dry.  Neurological:     Mental Status: [name] alert and oriented to person, place, and time.             Vital Signs: BP (!) 117/55 (BP Location: Right Arm)   Pulse 78   Temp 98.9 F (37.2  C) (Axillary)   Resp 18   Ht 5' 6"$  (1.676 m)   Wt 67.7 kg   SpO2 100%   BMI 24.09 kg/m  SpO2: SpO2: 100 % O2 Device: O2 Device: Nasal Cannula O2 Flow Rate: O2 Flow Rate (L/min): 6 L/min  Intake/output summary:  Intake/Output Summary (Last 24 hours) at 12/16/2022 1204 Last data filed at 12/16/2022 0900 Gross per 24 hour  Intake 560 ml  Output 1000 ml  Net -440 ml   LBM: Last BM Date : 12/16/22 Baseline Weight: Weight: 58.5 kg Most recent weight: Weight: 67.7 kg       Palliative Assessment/Data: PPS 50%      Patient Active Problem List   Diagnosis Date Noted   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 12/12/2022   Lobar pneumonia ([place]) 12/11/2022   Pancytopenia ([place]) 12/10/2022   Pressure injury of skin 12/09/2022   Pulmonary hypertension ([place]) 12/09/2022   Thrombocytopenia ([place]) 12/09/2022   COPD with acute exacerbation ([place]) 12/08/2022   COVID-19 virus infection 12/08/2022   Dyslipidemia 12/08/2022   Essential hypertension 12/08/2022   Hypomagnesemia 12/08/2022   Hypokalemia 12/08/2022   Elevated troponin I level 12/08/2022   Type  2 diabetes mellitus ([place]) 12/08/2022   Acute pulmonary embolism ([place]) 12/08/2022   Acute and chronic respiratory failure with hypoxia ([place]) 12/08/2022   Weight loss 05/13/2022   Dental caries 05/13/2022   Respiratory distress 11/14/2020   Acute on chronic respiratory failure ([place]) 02/17/2020   Aortic atherosclerosis ([place]) 08/01/2019   Shortness of breath 08/01/2019   Therapeutic drug monitoring 05/30/2019   Chronic respiratory failure with hypoxia ([place]) 05/30/2019   Nocturnal hypoxemia 04/26/2019   Medication management 04/26/2019   Drug reaction 10/18/2018   Vomiting without nausea 10/18/2018   Wheezing on auscultation 01/01/2017   Nodule of right lung 07/29/2015   Coronary artery calcification 02/11/2014   [place] 01/18/2014   Chronic cough 07/01/2013   Obesity (BMI 30.0-34.9) 07/26/2012   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 RA  (RA-ILD) 12/15/2011   Pneumonia 12/15/2011   HTN (hypertension) 12/15/2011   Rheumatoid arthritis ([place]) 12/15/2011   Hypothyroidism 12/15/2011    Palliative Care Assessment & Plan   HPI: 77 y.o. male  with past medical history of RA, ILD on Actemra and Imuran, emphysema and pHTN on Tyvaso and chronic respiratory failure on 4-5L at home, as well as recent COVID complicated by pancytopenia admitted on 12/07/2022 with dyspnea and respiratory distress.  Patient with frequent hospitalizations recently.  Patient currently being treated for acute on chronic respiratory failure, COPD exacerbation, and acute pulmonary embolism.  PMT consulted to discuss goals of care.   Assessment: Follow-up with patient today.  Conversation somewhat limited, a bit withdrawn today.  He tells me he is feeling better and thinks he is improving overall.  We reviewed his conversations with Dr. Chase Caller and he tells me he understands the seriousness of his lung disease.  He tells me he has just walked around with physical therapy.  He tells me he is ready to go home.  We discussed involving outpatient palliative and he is agreeable to this.  Call to wife Mellody Dance -she is mostly concerned about when patient will come home and if he is still contagious.  I tell her I will ask Dr. Darrick Meigs to give her a call to discuss further.  We also discussed patient's disease and previous conversations with Dr. Chase Caller.  Wife reviews that she would never want patient on a ventilator and he would not want that either.  She tells me if he were struggling she would want him to be given medications for comfort and minimize his suffering.  We discussed referral to outpatient palliative and she tells me she believes this already happened through [facility] and palliative.  TOC team alerted to this.   Recommendations/Plan: Referral to outpatient palliative, TOC team aware Continue goals of care conversations with pulmonology outpatient Not  interested in hospice at this time  Code Status: DNR  Care plan was discussed with patient, wife, [facility] team, Dr Darrick Meigs  Thank you for allowing the Palliative Medicine Team to assist in the care of this patient.   *Please note that this is a verbal dictation therefore any spelling or grammatical errors are due to the "[employer]" system interpretation.  Juel Burrow, DNP, [facility] Palliative Medicine Team Team Phone # [phone]  Pager [phone]

## 2022-12-16 NOTE — TOC Transition Note (Addendum)
Transition of Care [facility]) - CM/SW Discharge Note   Patient Details  Name: [name] MRN: [id number] Date of Birth: 01-18-1946  Transition of [facility]) CM/SW Contact:  Zenon Mayo, RN Phone Number: 12/16/2022, 10:43 AM   Clinical Narrative:    Patient is for possible dc tomorrow, per palliative rep also patient has no preference for agency for palliative services. NCM offered choice for outpatient palliative services he has no preference.  NCM made referral with Hillary at [facility]. Wife states she thinks they have palliative services with [facility] outpatient [place], [place] confirmed  this information. .      Barriers to Discharge: Continued Medical Work up   Patient Goals and CMS Choice CMS Medicare.gov Compare Post Acute Care list provided to:: Patient Choice offered to / list presented to : Patient  Discharge Placement                         Discharge Plan and Services Additional resources added to the After Visit Summary for   In-house Referral: NA Discharge Planning Services: CM Consult Post Acute Care Choice: Home Health          DME Arranged: N/A DME Agency: NA       HH Arranged: PT HH Agency: [facility] Date [place]: 12/08/22 Time [place]: [id number] Representative spoke with at [place]: [place] Determinants of Health ([place]) Interventions SDOH Screenings   Food Insecurity: No Food Insecurity (06/01/2019)  Housing: Low Risk  (06/01/2019)  Depression (PHQ2-9): Low Risk  (06/01/2019)  Financial Resource Strain: Low Risk  (06/01/2019)  Physical Activity: Inactive (06/01/2019)  Stress: No Stress Concern Present (06/01/2019)  Tobacco Use: Medium Risk (10/23/2022)     Readmission Risk Interventions    12/08/2022    3:54 PM  Readmission Risk Prevention Plan  Transportation Screening Complete  PCP or Specialist Appt within 3-5 Days Complete  HRI or [place] Complete   Social Work Consult for [place] Planning/Counseling Complete  Palliative Care Screening Not Applicable  Medication Review Press photographer) Complete

## 2022-12-16 NOTE — Progress Notes (Signed)
Patient requiring increased oxygen flow to keep sats above 90%.  Utilizing Flutter and IS.  Patent asymptomatic and able to pull 100 on IS.  RT paged for assessment and breathing treatment.

## 2022-12-16 NOTE — Plan of Care (Signed)
  Problem: Education: Goal: Knowledge of risk factors and measures for prevention of condition will improve Outcome: Progressing   Problem: Coping: Goal: Psychosocial and spiritual needs will be supported Outcome: Progressing   Problem: Respiratory: Goal: Will maintain a patent airway Outcome: Progressing Goal: Complications related to the disease process, condition or treatment will be avoided or minimized Outcome: Progressing   

## 2022-12-16 NOTE — Telephone Encounter (Signed)
Patient is readmitted to hospital at this time.  Knox Saliva, PharmD, MPH, BCPS, CPP Clinical Pharmacist (Rheumatology and Pulmonology)

## 2022-12-16 NOTE — Progress Notes (Addendum)
RT called to bedside by RN due to pt desat and increased oxygen requirements. RT arrived at bedside, pt was sitting in chair eating lunch. RT assessed pt, no increased WOB noted, diminished fine crackles BS, and vitals stable. RT placed pulse ox on pt's ear SpO2 100% on 9L [place], RT weaned O2 to 5L SpO2 96%.     12/16/22 1230  Therapy Vitals  Resp 20  MEWS Score/Color  MEWS Score 1  MEWS Score Color Green  Respiratory Assessment  Assessment Type Assess only  Respiratory Pattern Regular;Unlabored  Chest Assessment Chest expansion symmetrical  Cough None  Bilateral Breath Sounds Diminished;Fine crackles  Oxygen Therapy/Pulse Ox  O2 Device Nasal Cannula  O2 Therapy Oxygen  O2 Flow Rate (L/min) (S)  5 L/min  SpO2 96 %

## 2022-12-16 NOTE — Progress Notes (Signed)
Mobility Specialist - Progress Note   12/16/22 1000  Mobility  Activity Ambulated with assistance in room  Level of Assistance Standby assist, set-up cues, supervision of patient - no hands on  Assistive Device Four wheel walker  Distance Ambulated (ft) 80 ft  Activity Response Tolerated well  Mobility Referral Yes  $Mobility charge 1 Mobility   Pre-mobility: SpO2 96% During mobility: SpO2 93% Post-mobility: SpO2 87%  Pt received in recliner agreeable to mobility. No complaints, encouraged PLB throughout. Tolerated mobility on 8L O2, bumped to 10L EOS d/t SpO2 82% once seated. Left in recliner on 8L O2 w/ call bell within reach and all needs met.   [place] Specialist Please contact via SecureChat or Rehab office at [phone]

## 2022-12-16 NOTE — Progress Notes (Signed)
[facility]  PROGRESS NOTE  [name] [id number] DOB: 23-Aug-1946 DOA: 12/07/2022 PCP: Merrilee Seashore, MD   Brief HPI:   77 year old male with history of rheumatoid arthritis, ILD on Actemra and Imuran, emphysema, pulmonary hypertension on Tyvaso, chronic respiratory failure on 4 to 5 L oxygen at home, recent COVID infection complicated by pancytopenia presented with dyspnea and respiratory distress.  1/5-1/12: Admitted to [facility] for COVID infection, ABG showed pO2 36 on home 4L admitted to ICU on [place] and BiPAP; improved on steroids and discharged on Augmentin   1/21-1/29: Readmitted to [facility] with SOB/fever, this time found to have pancytopenia platelets 50K; treated with cefepime x9 days and discharged again home with [facility] on prednisone     2/5: Readmitted for respiratory distress; CTA chest showed bilateral inferior basilar posterior segmental pulmonary emboli 2/6: CCM consulted, recommended heparin, steroids 2/7: Echo shows stable cor pulmonale and pHTN 2/8: DVT study shows L gastroc DVT 2/9: Switched to apixaban, weaned to 6L at rest, but still 10L with ambulation 2/10: Still on 12L O2 with ambulation, Pulmonary consulted 12/14/2022: Patient is requiring 5L O2 by [place]. 12/15/2022: The patient's oxygen requirements have increased to 8L. Pulmonary has signed off.     Subjective   Patient seen and examined, continues to require oxygen HFNC 5 L/min   Assessment/Plan:     Acute respiratory failure with hypoxia ([place]) - Secondary submassive PE, also has underlying ILD - Also started on remdesivir for COVID-19 infection -PCCM was consulted, recommend to continue current treatment -Also recommend palliative care for goals of care discussion -Will give 1 dose of Lasix 40 mg IV as he has crackles bilaterally on auscultation   COPD with acute exacerbation (HCC) - Continue steroids. - Continue bronchodilators.     COVID-19 virus infection Tested positive in Nov 06, 2022.  Given he is on Rituxan, have to consider ongoing COVID infection. - Continue remdesivir - Airborne precautions -  COVID IGg Abs was nonreactive due to Rituxan   Dyslipidemia - Hold lipitor   Essential hypertension - Hold amlodipine given soft BP   Hypomagnesemia Supplement and monitor.   Hypokalemia Supplement and monitor.   Elevated troponin I level Likely myocardial injury due to COVID, ischemia ruled out   Type 2 diabetes mellitus (HCC) Glucose controlled - Hold metformin and NPH - Continue SS corrections     Acute pulmonary embolism (HCC) - Continue apixaban - Consulted IR for thrombectomy without tPA - The patient is not a candidate for catheter directed thrombectomy.   Thrombocytopenia ([place]) See below   Rheumatoid arthritis ([place]) - Hold Actemra, Imuran - Continue steroids   Pressure injury of skin  Sacrum, stage II, present on admission. Wound care consulted.   Pulmonary hypertension ([place]) Echo shows severe pulmonary hypertension and TR, estimated pressures may be higher than last echo.  Got Lasix 2/9 - Consult Pulmonology appreciate cares. - Continue Treprostinil.     Hypothyroidism - Continue levothyroxine   Pancytopenia ([place]) This first was noted mid Jan during admission at [facility], again WBC <4, Hgb <8, and platelets <50.  Suspect bone marrow suppression from COVID, in setting of Imuran   B12, folate, smear unremarkable.  Retics hypoproliferative. - Hold Imuran now and at discharge - Follow up with Hematology and Rheumatology and Pulmonology on discharge.   Lobar pneumonia ([place]) CT chest showed RIGHT basilar basilar dependent consolidation suspicious for pneumonia.   - Completed treatment with IV Rocephin - Continue aggressive pulmonary toilet   Interstitial lung  disease due to RA - Continue steroids - Hold Imuran - Pulmonology  has signed off -Will consult palliative care for further discussion of goals of  care    Medications     apixaban  5 mg Oral BID   vitamin C  500 mg Oral Daily   famotidine  20 mg Oral BID   finasteride  5 mg Oral Daily   fluticasone  2 spray Each Nare Daily   folic acid  [id number] mcg Oral Daily   insulin aspart  0-15 Units Subcutaneous TID WC   insulin aspart  0-5 Units Subcutaneous QHS   insulin aspart  3 Units Subcutaneous TID WC   levothyroxine  25 mcg Oral Q0600   pantoprazole  40 mg Oral Daily   predniSONE  50 mg Oral Daily   Treprostinil  48 mcg Inhalation 4 times per day   zinc sulfate  220 mg Oral Daily     Data Reviewed:   CBG:  Recent Labs  Lab 12/15/22 0557 12/15/22 1118 12/15/22 1654 12/15/22 2109 12/16/22 0552  GLUCAP 142* 174* 165* 161* 102*    SpO2: 100 % O2 Flow Rate (L/min): 6 L/min FiO2 (%): 60 %    Vitals:   12/15/22 1256 12/15/22 1813 12/16/22 0540 12/16/22 0913  BP:  115/64 (!) 111/59 (!) 117/55  Pulse: 93 78 78 78  Resp: 18 18 18 18  $ Temp: 97.6 F (36.4 C) (!) 97.1 F (36.2 C) (!) 97.1 F (36.2 C) 98.9 F (37.2 C)  TempSrc: Axillary Axillary Axillary Axillary  SpO2: 97% 100%  100%  Weight:   67.7 kg   Height:          Data Reviewed:  Basic Metabolic Panel: Recent Labs  Lab 12/10/22 0049 12/12/22 0036 12/13/22 0055 12/14/22 0104  NA 138 140 143 141  K 4.9 3.8 3.3* 3.8  CL 104 99 100 100  CO2 26 28 31 31  $ GLUCOSE 72 139* 36* 93  BUN 23 22 18 19  $ CREATININE 1.05 1.12 0.95 0.80  CALCIUM 7.5* 7.9* 8.3* 7.9*    CBC: Recent Labs  Lab 12/10/22 0049 12/11/22 0048 12/12/22 0036 12/13/22 0055 12/14/22 0104 12/15/22 0959  WBC 3.5* 2.3* 2.3* 2.1* 1.8* 1.6*  NEUTROABS 3.1 2.1  --   --   --  1.0*  HGB 7.6* 8.2* 8.4* 9.6* 7.6* 9.1*  HCT 24.2* 26.2* 26.2* 30.7* 24.3* 29.0*  MCV 88.6 87.6 87.9 86.5 86.8 86.1  PLT 49* 55* 57* 69* 61* 54*    LFT Recent Labs  Lab 12/10/22 0049 12/13/22 0055 12/14/22 0104  AST 26 18 13*  ALT 23 26 19  $ ALKPHOS 28* 39 33*  BILITOT 0.6 0.4 0.3  PROT 4.7* 5.3* 3.9*   ALBUMIN 2.4* 2.6* 2.0*     Antibiotics: Anti-infectives (From admission, onward)    Start     Dose/Rate Route Frequency Ordered Stop   12/13/22 1000  remdesivir 100 mg in sodium chloride 0.9 % 100 mL IVPB       Note to Pharmacy: Please message Dr. Brand Males with questions  See Hyperspace for full Linked Orders Report.   100 mg 200 mL/hr over 30 Minutes Intravenous Daily 12/12/22 1436 12/27/22 0959   12/12/22 1530  remdesivir 200 mg in sodium chloride 0.9% 250 mL IVPB       See Hyperspace for full Linked Orders Report.   200 mg 580 mL/hr over 30 Minutes Intravenous Once 12/12/22 1436 12/12/22 2158   12/11/22 1700  cefTRIAXone (  ROCEPHIN) 2 g in sodium chloride 0.9 % 100 mL IVPB        2 g 200 mL/hr over 30 Minutes Intravenous Every 24 hours 12/11/22 1601 12/12/22 1859   12/08/22 1000  nirmatrelvir/ritonavir (PAXLOVID) 3 tablet  Status:  Discontinued        3 tablet Oral 2 times daily 12/08/22 0403 12/10/22 1646   12/08/22 0800  cefTRIAXone (ROCEPHIN) 2 g in sodium chloride 0.9 % 100 mL IVPB  Status:  Discontinued        2 g 200 mL/hr over 30 Minutes Intravenous Daily 12/08/22 0611 12/10/22 1646        DVT prophylaxis: SCDs  Code Status: DNR  Family Communication: No family at bedside   CONSULTS PCCM   Objective    Physical Examination:   General: Appears in no acute distress Cardiovascular: S1-S2, regular, no murmur auscultated Respiratory: Bilateral rhonchi auscultated Abdomen: Soft, nontender, no organomegaly Extremities: No edema in the lower extremities Neurologic: Alert, oriented x 3, no focal deficit noted   Status is: Inpatient:      Pressure Injury 12/08/22 Sacrum Mid Stage 2 -  Partial thickness loss of dermis presenting as a shallow open injury with a red, pink wound bed without slough. (Active)  12/08/22 1905  Location: Sacrum  Location Orientation: Mid  Staging: Stage 2 -  Partial thickness loss of dermis presenting as a shallow open  injury with a red, pink wound bed without slough.  Wound Description (Comments):   Present on Admission: Yes        [place]   [facility] If 7PM-7AM, please contact night-coverage at [email or web address], Office  [phone]   12/16/2022, 9:17 AM  LOS: 8 days

## 2022-12-17 DIAGNOSIS — J9621 Acute and chronic respiratory failure with hypoxia: Secondary | ICD-10-CM | POA: Diagnosis not present

## 2022-12-17 DIAGNOSIS — U071 COVID-19: Secondary | ICD-10-CM | POA: Diagnosis not present

## 2022-12-17 DIAGNOSIS — J441 Chronic obstructive pulmonary disease with (acute) exacerbation: Secondary | ICD-10-CM | POA: Diagnosis not present

## 2022-12-17 DIAGNOSIS — I272 Pulmonary hypertension, unspecified: Secondary | ICD-10-CM | POA: Diagnosis not present

## 2022-12-17 LAB — COMPREHENSIVE METABOLIC PANEL
ALT: 19 U/L (ref 0–44)
AST: 18 U/L (ref 15–41)
Albumin: 1.9 g/dL — ABNORMAL LOW (ref 3.5–5.0)
Alkaline Phosphatase: 27 U/L — ABNORMAL LOW (ref 38–126)
Anion gap: 13 (ref 5–15)
BUN: 19 mg/dL (ref 8–23)
CO2: 29 mmol/L (ref 22–32)
Calcium: 7.5 mg/dL — ABNORMAL LOW (ref 8.9–10.3)
Chloride: 94 mmol/L — ABNORMAL LOW (ref 98–111)
Creatinine, Ser: 0.89 mg/dL (ref 0.61–1.24)
GFR, Estimated: >60 mL/min (ref >60)
Glucose, Bld: 122 mg/dL — ABNORMAL HIGH (ref 70–99)
Potassium: 4.1 mmol/L (ref 3.5–5.1)
Sodium: 136 mmol/L (ref 135–145)
Total Bilirubin: 0.9 mg/dL (ref 0.3–1.2)
Total Protein: 3.9 g/dL — ABNORMAL LOW (ref 6.5–8.1)

## 2022-12-17 LAB — CBC
HCT: 29.6 % — ABNORMAL LOW (ref 39.0–52.0)
Hemoglobin: 9.4 g/dL — ABNORMAL LOW (ref 13.0–17.0)
MCH: 27.1 pg (ref 26.0–34.0)
MCHC: 31.8 g/dL (ref 30.0–36.0)
MCV: 85.3 fL (ref 80.0–100.0)
Platelets: 84 10*3/uL — ABNORMAL LOW (ref 150–400)
RBC: 3.47 MIL/uL — ABNORMAL LOW (ref 4.22–5.81)
RDW: 21.6 % — ABNORMAL HIGH (ref 11.5–15.5)
WBC: 2.3 10*3/uL — ABNORMAL LOW (ref 4.0–10.5)
nRBC: 3 % — ABNORMAL HIGH (ref 0.0–0.2)

## 2022-12-17 LAB — GLUCOSE, CAPILLARY
Glucose-Capillary: 95 mg/dL (ref 70–99)
Glucose-Capillary: 168 mg/dL — ABNORMAL HIGH (ref 70–99)
Glucose-Capillary: 117 mg/dL — ABNORMAL HIGH (ref 70–99)
Glucose-Capillary: 86 mg/dL (ref 70–99)

## 2022-12-17 MED ORDER — PREDNISONE 20 MG PO TABS
40.0000 mg | ORAL_TABLET | Freq: Every day | ORAL | Status: AC
  Administered 2022-12-18: 40 mg via ORAL
  Filled 2022-12-17: qty 2

## 2022-12-17 MED ORDER — PREDNISONE 20 MG PO TABS
30.0000 mg | ORAL_TABLET | Freq: Every day | ORAL | Status: AC
  Administered 2022-12-19: 30 mg via ORAL
  Filled 2022-12-17: qty 1

## 2022-12-17 MED ORDER — PREDNISONE 10 MG PO TABS
5.0000 mg | ORAL_TABLET | Freq: Every day | ORAL | Status: AC
  Administered 2022-12-22: 5 mg via ORAL
  Filled 2022-12-17: qty 1

## 2022-12-17 MED ORDER — PREDNISONE 20 MG PO TABS
20.0000 mg | ORAL_TABLET | Freq: Every day | ORAL | Status: AC
  Administered 2022-12-20: 20 mg via ORAL
  Filled 2022-12-17: qty 1

## 2022-12-17 MED ORDER — PREDNISONE 1 MG PO TABS
3.0000 mg | ORAL_TABLET | Freq: Every day | ORAL | Status: DC
  Administered 2022-12-23: 3 mg via ORAL
  Filled 2022-12-17 (×2): qty 3

## 2022-12-17 MED ORDER — PREDNISONE 10 MG PO TABS
10.0000 mg | ORAL_TABLET | Freq: Every day | ORAL | Status: AC
  Administered 2022-12-21: 10 mg via ORAL
  Filled 2022-12-17: qty 1

## 2022-12-17 MED ORDER — FUROSEMIDE 10 MG/ML IJ SOLN
40.0000 mg | Freq: Once | INTRAMUSCULAR | Status: AC
  Administered 2022-12-17: 40 mg via INTRAVENOUS
  Filled 2022-12-17: qty 4

## 2022-12-17 NOTE — Progress Notes (Signed)
[facility]  PROGRESS NOTE  [name] [id number] DOB: 1946/05/16 DOA: 12/07/2022 PCP: Merrilee Seashore, MD   Brief HPI:   77 year old male with history of rheumatoid arthritis, ILD on Actemra and Imuran, emphysema, pulmonary hypertension on Tyvaso, chronic respiratory failure on 4 to 5 L oxygen at home, recent COVID infection complicated by pancytopenia presented with dyspnea and respiratory distress.  1/5-1/12: Admitted to [facility] for COVID infection, ABG showed pO2 36 on home 4L admitted to ICU on [place] and BiPAP; improved on steroids and discharged on Augmentin   1/21-1/29: Readmitted to [facility] with SOB/fever, this time found to have pancytopenia platelets 50K; treated with cefepime x9 days and discharged again home with [facility] on prednisone     2/5: Readmitted for respiratory distress; CTA chest showed bilateral inferior basilar posterior segmental pulmonary emboli 2/6: CCM consulted, recommended heparin, steroids 2/7: Echo shows stable cor pulmonale and pHTN 2/8: DVT study shows L gastroc DVT 2/9: Switched to apixaban, weaned to 6L at rest, but still 10L with ambulation 2/10: Still on 12L O2 with ambulation, Pulmonary consulted 12/14/2022: Patient is requiring 5L O2 by [place]. 12/15/2022: The patient's oxygen requirements have increased to 8L. Pulmonary has signed off.     Subjective   Patient still requiring 5 L pulmonary of oxygen via nasal cannula.   Assessment/Plan:     Acute respiratory failure with hypoxia (HCC) - Secondary submassive PE, also has underlying ILD - Also started on remdesivir for COVID-19 infection -PCCM was consulted, recommend to continue current treatment -Also recommend palliative care for goals of care discussion -Diuresed with with IV Lasix given yesterday -Will repeat dose of Lasix 40 mg IV today   COPD with acute exacerbation ([place]) - Continue steroids. - Continue bronchodilators.     COVID-19 virus infection Tested positive in  Nov 06, 2022.  Given he is on Rituxan, have to consider ongoing COVID infection. - Continue remdesivir for total 14 days, last day 12/26/22 - Airborne precautions -  COVID IGg Abs was nonreactive due to Rituxan   Dyslipidemia - Hold lipitor   Essential hypertension - Hold amlodipine given soft BP   Hypomagnesemia Supplement and monitor.   Hypokalemia Supplement and monitor.   Elevated troponin I level Likely myocardial injury due to COVID, ischemia ruled out   Type 2 diabetes mellitus (HCC) Glucose controlled - Hold metformin and NPH - Continue SS corrections     Acute pulmonary embolism (HCC) - Continue apixaban - Consulted IR for thrombectomy without tPA - The patient is not a candidate for catheter directed thrombectomy.   Thrombocytopenia ([place]) See below   Rheumatoid arthritis ([place]) - Hold Actemra, Imuran - Continue steroids   Pressure injury of skin  Sacrum, stage II, present on admission. Wound care consulted.   Pulmonary hypertension ([place]) Echo shows severe pulmonary hypertension and TR, estimated pressures may be higher than last echo.  Got Lasix 2/9 - Consult Pulmonology appreciate cares. - Continue Treprostinil.     Hypothyroidism - Continue levothyroxine   Pancytopenia ([place]) This first was noted mid Jan during admission at [facility], again WBC <4, Hgb <8, and platelets <50.  Suspect bone marrow suppression from COVID, in setting of Imuran   B12, folate, smear unremarkable.  Retics hypoproliferative. - Hold Imuran now and at discharge - Follow up with Hematology and Rheumatology and Pulmonology on discharge.   Lobar pneumonia ([place]) CT chest showed RIGHT basilar basilar dependent consolidation suspicious for pneumonia.   - Completed treatment with IV Rocephin - Continue  aggressive pulmonary toilet   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 - Continue steroids - Hold Imuran - Pulmonology  has signed off -Palliative care consulted for goals of  care, patient wants to follow-up with palliative care as outpatient    Medications     apixaban  5 mg Oral BID   vitamin C  500 mg Oral Daily   famotidine  20 mg Oral BID   finasteride  5 mg Oral Daily   fluticasone  2 spray Each Nare Daily   folic acid  [id number] mcg Oral Daily   insulin aspart  0-15 Units Subcutaneous TID WC   insulin aspart  0-5 Units Subcutaneous QHS   insulin aspart  3 Units Subcutaneous TID WC   levothyroxine  25 mcg Oral Q0600   pantoprazole  40 mg Oral Daily   predniSONE  50 mg Oral Daily   Treprostinil  48 mcg Inhalation 4 times per day   zinc sulfate  220 mg Oral Daily     Data Reviewed:   CBG:  Recent Labs  Lab 12/16/22 0552 12/16/22 1053 12/16/22 1623 12/16/22 2141 12/17/22 0648  GLUCAP 102* 138* 133* 174* 95    SpO2: 99 % O2 Flow Rate (L/min): 5 L/min FiO2 (%): 60 %    Vitals:   12/16/22 1230 12/16/22 1725 12/16/22 2138 12/17/22 1016  BP:  108/69 (!) 97/49 (!) 133/57  Pulse:  99 76 87  Resp: 20 20 20 18  $ Temp:  (!) 97.4 F (36.3 C) 98.4 F (36.9 C) (!) 96.8 F (36 C)  TempSrc:  Axillary Oral Oral  SpO2: 96% 97% 100% 99%  Weight:      Height:          Data Reviewed:  Basic Metabolic Panel: Recent Labs  Lab 12/12/22 0036 12/13/22 0055 12/14/22 0104 12/17/22 0051  NA 140 143 141 136  K 3.8 3.3* 3.8 4.1  CL 99 100 100 94*  CO2 28 31 31 29  $ GLUCOSE 139* 36* 93 122*  BUN 22 18 19 19  $ CREATININE 1.12 0.95 0.80 0.89  CALCIUM 7.9* 8.3* 7.9* 7.5*    CBC: Recent Labs  Lab 12/11/22 0048 12/12/22 0036 12/13/22 0055 12/14/22 0104 12/15/22 0959 12/17/22 0051  WBC 2.3* 2.3* 2.1* 1.8* 1.6* 2.3*  NEUTROABS 2.1  --   --   --  1.0*  --   HGB 8.2* 8.4* 9.6* 7.6* 9.1* 9.4*  HCT 26.2* 26.2* 30.7* 24.3* 29.0* 29.6*  MCV 87.6 87.9 86.5 86.8 86.1 85.3  PLT 55* 57* 69* 61* 54* 84*    LFT Recent Labs  Lab 12/13/22 0055 12/14/22 0104 12/17/22 0051  AST 18 13* 18  ALT 26 19 19  $ ALKPHOS 39 33* 27*  BILITOT 0.4 0.3 0.9   PROT 5.3* 3.9* 3.9*  ALBUMIN 2.6* 2.0* 1.9*     Antibiotics: Anti-infectives (From admission, onward)    Start     Dose/Rate Route Frequency Ordered Stop   12/13/22 1000  remdesivir 100 mg in sodium chloride 0.9 % 100 mL IVPB       Note to Pharmacy: Please message Dr. Brand Males with questions  See Hyperspace for full Linked Orders Report.   100 mg 200 mL/hr over 30 Minutes Intravenous Daily 12/12/22 1436 12/27/22 0959   12/12/22 1530  remdesivir 200 mg in sodium chloride 0.9% 250 mL IVPB       See Hyperspace for full Linked Orders Report.   200 mg 580 mL/hr over 30 Minutes Intravenous Once  12/12/22 1436 12/12/22 2158   12/11/22 1700  cefTRIAXone (ROCEPHIN) 2 g in sodium chloride 0.9 % 100 mL IVPB        2 g 200 mL/hr over 30 Minutes Intravenous Every 24 hours 12/11/22 1601 12/12/22 1859   12/08/22 1000  nirmatrelvir/ritonavir (PAXLOVID) 3 tablet  Status:  Discontinued        3 tablet Oral 2 times daily 12/08/22 0403 12/10/22 1646   12/08/22 0800  cefTRIAXone (ROCEPHIN) 2 g in sodium chloride 0.9 % 100 mL IVPB  Status:  Discontinued        2 g 200 mL/hr over 30 Minutes Intravenous Daily 12/08/22 0611 12/10/22 1646        DVT prophylaxis: SCDs  Code Status: DNR  Family Communication: No family at bedside   CONSULTS PCCM   Objective    Physical Examination:   Appears in no acute distress Lungs bibasilar crackles noted Heart S1 and 2, regular, no murmur auscultated Abdomen is soft, nontender, no organomegaly   Status is: Inpatient:      Pressure Injury 12/08/22 Sacrum Mid Stage 2 -  Partial thickness loss of dermis presenting as a shallow open injury with a red, pink wound bed without slough. (Active)  12/08/22 1905  Location: Sacrum  Location Orientation: Mid  Staging: Stage 2 -  Partial thickness loss of dermis presenting as a shallow open injury with a red, pink wound bed without slough.  Wound Description (Comments):   Present on Admission: Yes         [place]   [facility] If 7PM-7AM, please contact night-coverage at [email or web address], Office  [phone]   12/17/2022, 10:48 AM  LOS: 9 days

## 2022-12-17 NOTE — Progress Notes (Signed)
Mobility Specialist - Progress Note   12/17/22 1419  Mobility  Activity Ambulated with assistance in room  Level of Assistance Contact guard assist, steadying assist  Assistive Device Front wheel walker  Distance Ambulated (ft) 10 ft  Activity Response Tolerated well  Mobility Referral Yes  $Mobility charge 1 Mobility    Pt in bed requesting to use BR. Tolerated on 6L O2, had BM then returned to bed. No complaints throughout. Left in bed w/ call bell by his side and all needs met.   Pre-mobility: SpO2 98% During mobility: SpO2 100% Post-mobility: 96%  [place] Specialist Please contact via Solicitor or Rehab office at [phone]

## 2022-12-17 NOTE — Progress Notes (Signed)
SATURATION QUALIFICATIONS: (This note is used to comply with regulatory documentation for home oxygen)  Patient Saturations on Room Air at Rest = 85%  Patient Saturations on Room Air while Ambulating = NT as desats on RA at rest  Patient Saturations on 6 Liters of oxygen while Ambulating = 90%  Please briefly explain why patient needs home oxygen:Pt desaturated to 87% on 4LO2 with activity and ultimately needed 6LO2 with activity to keep sats >90%.  Jovie Swanner M,PT Acute Rehab Services [phone]

## 2022-12-17 NOTE — Progress Notes (Signed)
Physical Therapy Treatment Patient Details Name: [name] MRN: [id number] DOB: [birth year] Today's Date: 12/17/2022   History of Present Illness 77 year old male presented to ED 2/6 with c/o shortness of breath and SpO2 dropping to the 70s on his 4L O2 baseline. CT angiogram of chest which showed bilateral peripheral pulmonary emboli, with right heart strain. Admitted for treatment of COPD exacerbation with hypoxia. PMH: COPD, type 2 diabetes mellitus, emphysema, hypertension, RA, osteoarthritis and hypothyroidism, pHTN on Tyvaso and chronic respiratory failure on 4-5L at home, diagnosis of COVID in January    PT Comments    Pt admitted with above diagnosis. Pt was able to ambulate in room and incr distance today.  Does desaturate with incr distance however pt did know when he needed to rest.  Pt can maintain sats on 4L with short distance ambulation for about 40 feet and then pt desaturates.  Incr O2 to 6L and pt sats improved.  Pt should do well with 4L at rest and 6L with activity.  He has a pulse oximeter.  Continue acute PT.  Pt currently with functional limitations due to balance and endurance deficits. Pt will benefit from skilled PT to increase their independence and safety with mobility to allow discharge to the venue listed below.      Recommendations for follow up therapy are one component of a multi-disciplinary discharge planning process, led by the attending physician.  Recommendations may be updated based on patient status, additional functional criteria and insurance authorization.  Follow Up Recommendations  Home health PT (resume with current agency)     Assistance Recommended at Discharge Intermittent Supervision/Assistance  Patient can return home with the following A little help with walking and/or transfers;A little help with bathing/dressing/bathroom;Assistance with cooking/housework;Direct supervision/assist for medications management;Direct supervision/assist for  financial management;Assist for transportation;Help with stairs or ramp for entrance   Equipment Recommendations  Wheelchair (measurements PT);Wheelchair cushion (measurements PT)    Recommendations for Other Services OT consult     Precautions / Restrictions Precautions Precautions: Fall Restrictions Weight Bearing Restrictions: No     Mobility  Bed Mobility Overal bed mobility: Modified Independent             General bed mobility comments: uses bed rail to pull to the EoB and bring trunk to upright,    Transfers Overall transfer level: Needs assistance Equipment used: Rolling walker (2 wheels), None Transfers: Sit to/from Stand, Bed to chair/wheelchair/BSC Sit to Stand: Min guard           General transfer comment: min guard for power up    Ambulation/Gait Ambulation/Gait assistance: Min guard Gait Distance (Feet): 60 Feet Assistive device: Rolling walker (2 wheels) Gait Pattern/deviations: Step-through pattern, Decreased step length - right, Decreased step length - left, Trunk flexed Gait velocity: slowed Gait velocity interpretation: <1.31 ft/sec, indicative of household ambulator   General Gait Details: min guard A for management of lines and leads to ambulate from bed<>door. pt with 3/4 DoE with ambulation Pt was on 4LO2 at rest with sats mid 90's.  Pt desat on 4L to 87% after short distance ambulation once seated EOB therefore needed 6 L with ambulation for sats >90%.  Able to place pt back on 4L at end of treatement.  Pt fatigues quickly.   Stairs             Wheelchair Mobility    Modified Rankin (Stroke Patients Only)       Balance Overall balance assessment: Needs assistance Sitting-balance support: Feet supported,  No upper extremity supported Sitting balance-Leahy Scale: Good     Standing balance support: Single extremity supported, Bilateral upper extremity supported, During functional activity, Reliant on assistive device for  balance Standing balance-Leahy Scale: Poor Standing balance comment: requires at least single UE support                            Cognition Arousal/Alertness: Awake/alert Behavior During Therapy: WFL for tasks assessed/performed Overall Cognitive Status: Within Functional Limits for tasks assessed                                          Exercises General Exercises - Lower Extremity Ankle Circles/Pumps: AROM, Both, 5 reps, Seated Long Arc Quad: AROM, Both, Seated, 10 reps Hip Flexion/Marching: AROM, Both, Seated, 20 reps    General Comments        Pertinent Vitals/Pain Pain Assessment Pain Assessment: No/denies pain    Home Living                          Prior Function            PT Goals (current goals can now be found in the care plan section) Acute Rehab PT Goals Patient Stated Goal: breathe better Progress towards PT goals: Progressing toward goals    Frequency    Min 3X/week      PT Plan Current plan remains appropriate    Co-evaluation              AM-PAC PT "6 Clicks" Mobility   Outcome Measure  Help needed turning from your back to your side while in a flat bed without using bedrails?: None Help needed moving from lying on your back to sitting on the side of a flat bed without using bedrails?: None Help needed moving to and from a bed to a chair (including a wheelchair)?: A Little Help needed standing up from a chair using your arms (e.g., wheelchair or bedside chair)?: A Little Help needed to walk in hospital room?: A Little Help needed climbing 3-5 steps with a railing? : A Lot 6 Click Score: 19    End of Session Equipment Utilized During Treatment: Oxygen;Gait belt Activity Tolerance: Treatment limited secondary to medical complications (Comment);Patient limited by fatigue (increased O2 demand) Patient left: in bed;with call bell/phone within reach;with bed alarm set Nurse Communication:  Mobility status PT Visit Diagnosis: Unsteadiness on feet (R26.81);Muscle weakness (generalized) (M62.81);Difficulty in walking, not elsewhere classified (R26.2)     Time: [id number] PT Time Calculation (min) (ACUTE ONLY): 17 min  Charges:  $Gait Training: 8-22 mins                     [name] M,PT Acute Rehab Services [phone]    [name] 12/17/2022, 11:27 AM

## 2022-12-18 LAB — COMPREHENSIVE METABOLIC PANEL
ALT: 22 U/L (ref 0–44)
AST: 21 U/L (ref 15–41)
Albumin: 2.3 g/dL — ABNORMAL LOW (ref 3.5–5.0)
Alkaline Phosphatase: 36 U/L — ABNORMAL LOW (ref 38–126)
Anion gap: 13 (ref 5–15)
BUN: 21 mg/dL (ref 8–23)
CO2: 33 mmol/L — ABNORMAL HIGH (ref 22–32)
Calcium: 7.9 mg/dL — ABNORMAL LOW (ref 8.9–10.3)
Chloride: 93 mmol/L — ABNORMAL LOW (ref 98–111)
Creatinine, Ser: 0.95 mg/dL (ref 0.61–1.24)
GFR, Estimated: >60 mL/min (ref >60)
Glucose, Bld: 127 mg/dL — ABNORMAL HIGH (ref 70–99)
Potassium: 3.9 mmol/L (ref 3.5–5.1)
Sodium: 139 mmol/L (ref 135–145)
Total Bilirubin: 1 mg/dL (ref 0.3–1.2)
Total Protein: 4.5 g/dL — ABNORMAL LOW (ref 6.5–8.1)

## 2022-12-18 LAB — GLUCOSE, CAPILLARY
Glucose-Capillary: 114 mg/dL — ABNORMAL HIGH (ref 70–99)
Glucose-Capillary: 254 mg/dL — ABNORMAL HIGH (ref 70–99)
Glucose-Capillary: 173 mg/dL — ABNORMAL HIGH (ref 70–99)
Glucose-Capillary: 43 mg/dL — CL (ref 70–99)
Glucose-Capillary: 45 mg/dL — ABNORMAL LOW (ref 70–99)
Glucose-Capillary: 101 mg/dL — ABNORMAL HIGH (ref 70–99)

## 2022-12-18 LAB — RESP PANEL BY RT-PCR (RSV, FLU A&B, COVID)  RVPGX2
Influenza A by PCR: NEGATIVE
Influenza B by PCR: NEGATIVE
Resp Syncytial Virus by PCR: NEGATIVE
SARS Coronavirus 2 by RT PCR: NEGATIVE

## 2022-12-18 MED ORDER — GLUCAGON HCL RDNA (DIAGNOSTIC) 1 MG IJ SOLR: 1.0000 mg | Freq: Once | INTRAMUSCULAR | Status: DC | PRN

## 2022-12-18 MED ORDER — APIXABAN 5 MG PO TABS: 5.0000 mg | ORAL_TABLET | Freq: Two times a day (BID) | ORAL | Status: DC

## 2022-12-18 MED ORDER — INSULIN ASPART 100 UNIT/ML IJ SOLN
0.0000 [IU] | INTRAMUSCULAR | Status: DC
  Administered 2022-12-19 (×2): 1 [IU] via SUBCUTANEOUS
  Administered 2022-12-20: 7 [IU] via SUBCUTANEOUS
  Administered 2022-12-20 – 2022-12-21 (×3): 2 [IU] via SUBCUTANEOUS
  Administered 2022-12-21: 3 [IU] via SUBCUTANEOUS
  Administered 2022-12-21: 1 [IU] via SUBCUTANEOUS
  Administered 2022-12-22: 3 [IU] via SUBCUTANEOUS

## 2022-12-18 MED ORDER — DEXTROSE 50 % IV SOLN
12.5000 g | INTRAVENOUS | Status: DC | PRN
  Administered 2022-12-21: 12.5 g via INTRAVENOUS
  Filled 2022-12-18: qty 50

## 2022-12-18 MED ORDER — DEXTROSE 50 % IV SOLN
1.0000 | Freq: Once | INTRAVENOUS | Status: AC
  Administered 2022-12-18: 50 mL via INTRAVENOUS

## 2022-12-18 MED ORDER — GLUCAGON HCL RDNA (DIAGNOSTIC) 1 MG IJ SOLR
INTRAMUSCULAR | Status: AC
  Filled 2022-12-18: qty 1

## 2022-12-18 MED ORDER — DEXTROSE 250 MG/ML IV SOLN
25.0000 g | Freq: Once | INTRAVENOUS | Status: DC
  Filled 2022-12-18: qty 100

## 2022-12-18 MED ORDER — APIXABAN 2.5 MG PO TABS
2.5000 mg | ORAL_TABLET | Freq: Two times a day (BID) | ORAL | Status: DC
  Administered 2022-12-19 – 2022-12-23 (×9): 2.5 mg via ORAL
  Filled 2022-12-18 (×9): qty 1

## 2022-12-18 MED ORDER — NIRMATRELVIR/RITONAVIR (PAXLOVID)TABLET
3.0000 | ORAL_TABLET | Freq: Two times a day (BID) | ORAL | Status: AC
  Administered 2022-12-19 – 2022-12-22 (×8): 3 via ORAL
  Filled 2022-12-18 (×2): qty 30

## 2022-12-18 MED ORDER — FUROSEMIDE 40 MG PO TABS
40.0000 mg | ORAL_TABLET | Freq: Once | ORAL | Status: AC
  Administered 2022-12-18: 40 mg via ORAL
  Filled 2022-12-18: qty 1

## 2022-12-18 NOTE — TOC Progression Note (Signed)
Transition of Care [facility]) - Progression Note    Patient Details  Name: [name] MRN: [id number] Date of Birth: [birth year]  Transition of Care [facility]) CM/SW Contact  Zenon Mayo, RN Phone Number: 12/18/2022, 3:29 PM  Clinical Narrative:    Patient's 10 liter concentrator was delivered by [employer] to his home on 12/11/22 per Cyril Mourning with [employer].     Expected Discharge Plan: [place] Barriers to Discharge: Continued Medical Work up  Expected Discharge Plan and Services In-house Referral: NA Discharge Planning Services: CM Consult Post Acute Care Choice: [place] arrangements for the past 2 months: Single Family Home                 DME Arranged: N/A DME Agency: NA       HH Arranged: PT HH Agency: [place] Date [place]: 12/08/22 Time [place]: [id number] Representative spoke with at [place]: [place] Determinants of Health ([place]) Interventions SDOH Screenings   Food Insecurity: No Food Insecurity (06/01/2019)  Housing: Low Risk  (06/01/2019)  Depression (PHQ2-9): Low Risk  (06/01/2019)  Financial Resource Strain: Low Risk  (06/01/2019)  Physical Activity: Inactive (06/01/2019)  Stress: No Stress Concern Present (06/01/2019)  Tobacco Use: Medium Risk (10/23/2022)    Readmission Risk Interventions    12/08/2022    3:54 PM  Readmission Risk Prevention Plan  Transportation Screening Complete  PCP or Specialist Appt within 3-5 Days Complete  HRI or [place] Complete  Social Work Consult for [place] Planning/Counseling Complete  Palliative Care Screening Not Applicable  Medication Review Press photographer) Complete

## 2022-12-18 NOTE — Progress Notes (Signed)
Approximately 10:30--This RN provided report to Autumn, RN on 2W d/t pt's transfer orders. Full report provided with all questions answered. Pt A&O x 4, VSS. No complaints of pain or discomfort. Pt transferred to room on 2W by this RN and NT. Pt transported on 6L O2  and connected to wall O2 once in room. All pt belongings transported with pt, pt verified. Telemetry notified.

## 2022-12-18 NOTE — Care Management Important Message (Signed)
Important Message  Patient Details  Name: [name] MRN: [id number] Date of Birth: May 03, 1946   Medicare Important Message Given:  Yes     Hannah Beat 12/18/2022, 11:55 AM

## 2022-12-18 NOTE — Progress Notes (Signed)
Mobility Specialist - Progress Note   12/18/22 0900  Mobility  Activity Ambulated with assistance in hallway  Level of Assistance Standby assist, set-up cues, supervision of patient - no hands on  Assistive Device Front wheel walker  Distance Ambulated (ft) 80 ft  Activity Response Tolerated well  Mobility Referral Yes  $Mobility charge 1 Mobility    Pt received in bed agreeable to mobility. No complaints throughout, took standing break x1 to encourage PLB. Left sitting EOB w/ call bell in his lap and bed alarm on.   Pre-mobility: SpO2 98% During mobility: SpO2 87% Post-mobility: SpO2 97%  [place] Specialist Please contact via Solicitor or Rehab office at [phone]

## 2022-12-18 NOTE — Progress Notes (Addendum)
[facility]  PROGRESS NOTE  [name] [id number] DOB: [birth year] DOA: 12/07/2022 PCP: Merrilee Seashore, MD   Brief HPI:   77 year old male with history of rheumatoid arthritis, ILD on Actemra and Imuran, emphysema, pulmonary hypertension on Tyvaso, chronic respiratory failure on 4 to 5 L oxygen at home, recent COVID infection complicated by pancytopenia presented with dyspnea and respiratory distress.  1/5-1/12: Admitted to [facility] for COVID infection, ABG showed pO2 36 on home 4L admitted to ICU on [place] and BiPAP; improved on steroids and discharged on Augmentin   1/21-1/29: Readmitted to [facility] with SOB/fever, this time found to have pancytopenia platelets 50K; treated with cefepime x9 days and discharged again home with [facility] on prednisone     2/5: Readmitted for respiratory distress; CTA chest showed bilateral inferior basilar posterior segmental pulmonary emboli 2/6: CCM consulted, recommended heparin, steroids 2/7: Echo shows stable cor pulmonale and pHTN 2/8: DVT study shows L gastroc DVT 2/9: Switched to apixaban, weaned to 6L at rest, but still 10L with ambulation 2/10: Still on 12L O2 with ambulation, Pulmonary consulted 12/14/2022: Patient is requiring 5L O2 by [place]. 12/15/2022: The patient's oxygen requirements have increased to 8L. Pulmonary has signed off.     Subjective   Patient is breathing better after he received  Lasix yesterday.  Currently requiring 4 to 5 L of oxygen via nasal cannula, mostly baseline.   Assessment/Plan:     Acute respiratory failure with hypoxia (HCC) - Secondary submassive PE, also has underlying ILD - Also started on remdesivir for COVID-19 infection -PCCM was consulted, recommend to continue current treatment -Also recommend palliative care for goals of care discussion -Diuresed with with IV Lasix given yesterday -Will give 1 dose of p.o. Lasix 40 mg   COPD with acute exacerbation (HCC) - Continue steroids. - Continue  bronchodilators.     COVID-19 virus infection Tested positive in Nov 06, 2022.  Given he is on Rituxan, have to consider ongoing COVID infection. - Continue remdesivir for total 14 days, last day 12/26/22 - Airborne precautions -  COVID IGg Abs was nonreactive due to Rituxan -Discussed with ID Dr. Karolee Ohs and pulmonology Dr. Etta Quill; will discontinue remdesivir today.  Patient be discharged home on Paxlovid for 5 more days.   Dyslipidemia - Hold lipitor   Essential hypertension - Hold amlodipine given soft BP   Hypomagnesemia Supplement and monitor.   Hypokalemia Replete   Elevated troponin I level Likely myocardial injury due to COVID, ischemia ruled out   Type 2 diabetes mellitus (HCC) Glucose controlled - Hold metformin and NPH - Continue SS corrections     Acute pulmonary embolism (HCC) - Continue apixaban - Consulted IR for thrombectomy without tPA - The patient is not a candidate for catheter directed thrombectomy.   Thrombocytopenia ([place]) See below   Rheumatoid arthritis ([place]) - Hold Actemra, Imuran - Continue steroids   Pressure injury of skin  Sacrum, stage II, present on admission. Wound care consulted.   Pulmonary hypertension ([place]) Echo shows severe pulmonary hypertension and TR, estimated pressures may be higher than last echo.  Got Lasix 2/9 - Consult Pulmonology appreciate cares. - Continue Treprostinil.     Hypothyroidism - Continue levothyroxine   Pancytopenia ([place]) This first was noted mid Jan during admission at [facility], again WBC <4, Hgb <8, and platelets <50.  Suspect bone marrow suppression from COVID, in setting of Imuran   B12, folate, smear unremarkable.  Retics hypoproliferative. - Hold Imuran now and at discharge -  Follow up with Hematology and Rheumatology and Pulmonology on discharge.   Lobar pneumonia ([place]) CT chest showed RIGHT basilar basilar dependent consolidation suspicious for pneumonia.   - Completed  treatment with IV Rocephin - Continue aggressive pulmonary toilet   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 - Continue steroids - Hold Imuran - Pulmonology  has signed off -Palliative care consulted for goals of care, patient wants to follow-up with palliative care as outpatient    Medications     [START ON 12/19/2022] apixaban  2.5 mg Oral BID   Followed by   Derrill Memo ON 12/24/2022] apixaban  5 mg Oral BID   apixaban  5 mg Oral BID   vitamin C  500 mg Oral Daily   famotidine  20 mg Oral BID   finasteride  5 mg Oral Daily   fluticasone  2 spray Each Nare Daily   folic acid  [id number] mcg Oral Daily   insulin aspart  0-15 Units Subcutaneous TID WC   insulin aspart  0-5 Units Subcutaneous QHS   insulin aspart  3 Units Subcutaneous TID WC   levothyroxine  25 mcg Oral Q0600   [START ON 12/19/2022] nirmatrelvir/ritonavir  3 tablet Oral BID   pantoprazole  40 mg Oral Daily   [START ON 12/19/2022] predniSONE  30 mg Oral Q breakfast   Followed by   Derrill Memo ON 12/20/2022] predniSONE  20 mg Oral Q breakfast   Followed by   Derrill Memo ON 12/21/2022] predniSONE  10 mg Oral Q breakfast   Followed by   Derrill Memo ON 12/22/2022] predniSONE  5 mg Oral Q breakfast   Followed by   Derrill Memo ON 12/23/2022] predniSONE  3 mg Oral Q breakfast   Treprostinil  48 mcg Inhalation 4 times per day   zinc sulfate  220 mg Oral Daily     Data Reviewed:   CBG:  Recent Labs  Lab 12/17/22 1149 12/17/22 1529 12/17/22 2127 12/18/22 0651 12/18/22 1157  GLUCAP 168* 117* 86 114* 254*    SpO2: 92 % O2 Flow Rate (L/min): 6 L/min FiO2 (%): 60 %    Vitals:   12/18/22 0024 12/18/22 0638 12/18/22 1000 12/18/22 1201  BP: (!) 109/54 (!) 117/57 100/61 133/67  Pulse: 75 71 88 81  Resp: 18 18 18 15  $ Temp: (!) 97.5 F (36.4 C) (!) 97.4 F (36.3 C)    TempSrc: Axillary Axillary    SpO2: 100% 100% 100% 92%  Weight:    60.1 kg  Height:    5' 6"$  (1.676 m)      Data Reviewed:  Basic Metabolic Panel: Recent Labs  Lab  12/12/22 0036 12/13/22 0055 12/14/22 0104 12/17/22 0051 12/18/22 0101  NA 140 143 141 136 139  K 3.8 3.3* 3.8 4.1 3.9  CL 99 100 100 94* 93*  CO2 28 31 31 29 $ 33*  GLUCOSE 139* 36* 93 122* 127*  BUN 22 18 19 19 21  $ CREATININE 1.12 0.95 0.80 0.89 0.95  CALCIUM 7.9* 8.3* 7.9* 7.5* 7.9*    CBC: Recent Labs  Lab 12/12/22 0036 12/13/22 0055 12/14/22 0104 12/15/22 0959 12/17/22 0051  WBC 2.3* 2.1* 1.8* 1.6* 2.3*  NEUTROABS  --   --   --  1.0*  --   HGB 8.4* 9.6* 7.6* 9.1* 9.4*  HCT 26.2* 30.7* 24.3* 29.0* 29.6*  MCV 87.9 86.5 86.8 86.1 85.3  PLT 57* 69* 61* 54* 84*    LFT Recent Labs  Lab 12/13/22 0055 12/14/22 0104 12/17/22 0051 12/18/22 0101  AST 18 13* 18 21  ALT 26 19 19 22  $ ALKPHOS 39 33* 27* 36*  BILITOT 0.4 0.3 0.9 1.0  PROT 5.3* 3.9* 3.9* 4.5*  ALBUMIN 2.6* 2.0* 1.9* 2.3*     Antibiotics: Anti-infectives (From admission, onward)    Start     Dose/Rate Route Frequency Ordered Stop   12/19/22 1000  nirmatrelvir/ritonavir (PAXLOVID) 3 tablet        3 tablet Oral 2 times daily 12/18/22 1355 12/24/22 0959   12/13/22 1000  remdesivir 100 mg in sodium chloride 0.9 % 100 mL IVPB  Status:  Discontinued       Note to Pharmacy: Please message Dr. Brand Males with questions  See Hyperspace for full Linked Orders Report.   100 mg 200 mL/hr over 30 Minutes Intravenous Daily 12/12/22 1436 12/18/22 1355   12/12/22 1530  remdesivir 200 mg in sodium chloride 0.9% 250 mL IVPB       See Hyperspace for full Linked Orders Report.   200 mg 580 mL/hr over 30 Minutes Intravenous Once 12/12/22 1436 12/12/22 2158   12/11/22 1700  cefTRIAXone (ROCEPHIN) 2 g in sodium chloride 0.9 % 100 mL IVPB        2 g 200 mL/hr over 30 Minutes Intravenous Every 24 hours 12/11/22 1601 12/12/22 1859   12/08/22 1000  nirmatrelvir/ritonavir (PAXLOVID) 3 tablet  Status:  Discontinued        3 tablet Oral 2 times daily 12/08/22 0403 12/10/22 1646   12/08/22 0800  cefTRIAXone (ROCEPHIN) 2 g  in sodium chloride 0.9 % 100 mL IVPB  Status:  Discontinued        2 g 200 mL/hr over 30 Minutes Intravenous Daily 12/08/22 0611 12/10/22 1646        DVT prophylaxis: SCDs  Code Status: DNR  Family Communication: No family at bedside   CONSULTS PCCM   Objective    Physical Examination:  General-appears in no acute distress Heart-S1-S2, regular, no murmur auscultated Lungs-bibasilar crackles auscultated Abdomen-soft, nontender, no organomegaly Extremities-no edema in the lower extremities Neuro-alert, oriented x3, no focal deficit noted   Status is: Inpatient:      Pressure Injury 12/08/22 Sacrum Mid Stage 2 -  Partial thickness loss of dermis presenting as a shallow open injury with a red, pink wound bed without slough. (Active)  12/08/22 1905  Location: Sacrum  Location Orientation: Mid  Staging: Stage 2 -  Partial thickness loss of dermis presenting as a shallow open injury with a red, pink wound bed without slough.  Wound Description (Comments):   Present on Admission: Yes        [place]   [facility] If 7PM-7AM, please contact night-coverage at [email or web address], Office  [phone]   12/18/2022, 2:36 PM  LOS: 10 days

## 2022-12-19 DIAGNOSIS — R55 Syncope and collapse: Secondary | ICD-10-CM

## 2022-12-19 LAB — GLUCOSE, CAPILLARY
Glucose-Capillary: 113 mg/dL — ABNORMAL HIGH (ref 70–99)
Glucose-Capillary: 141 mg/dL — ABNORMAL HIGH (ref 70–99)
Glucose-Capillary: 151 mg/dL — ABNORMAL HIGH (ref 70–99)
Glucose-Capillary: 158 mg/dL — ABNORMAL HIGH (ref 70–99)
Glucose-Capillary: 159 mg/dL — ABNORMAL HIGH (ref 70–99)
Glucose-Capillary: 170 mg/dL — ABNORMAL HIGH (ref 70–99)
Glucose-Capillary: 187 mg/dL — ABNORMAL HIGH (ref 70–99)
Glucose-Capillary: 79 mg/dL (ref 70–99)

## 2022-12-19 LAB — HEMOGLOBIN A1C
Hgb A1c MFr Bld: 6.6 % — ABNORMAL HIGH (ref 4.8–5.6)
Mean Plasma Glucose: 142.72 mg/dL

## 2022-12-19 MED ORDER — APIXABAN 5 MG PO TABS: 5.0000 mg | ORAL_TABLET | Freq: Two times a day (BID) | ORAL | 2 refills | Status: DC

## 2022-12-19 MED ORDER — NIRMATRELVIR/RITONAVIR (PAXLOVID)TABLET: 3.0000 | ORAL_TABLET | Freq: Two times a day (BID) | ORAL | 0 refills | Status: DC

## 2022-12-19 MED ORDER — APIXABAN 2.5 MG PO TABS: 2.5000 mg | ORAL_TABLET | Freq: Two times a day (BID) | ORAL | 0 refills | Status: DC

## 2022-12-19 MED ORDER — PREDNISONE 1 MG PO TABS: 3.0000 mg | ORAL_TABLET | Freq: Every day | ORAL | 2 refills | Status: DC

## 2022-12-19 MED ORDER — FUROSEMIDE 20 MG PO TABS
20.0000 mg | ORAL_TABLET | Freq: Every day | ORAL | Status: DC
  Administered 2022-12-19: 20 mg via ORAL
  Filled 2022-12-19: qty 1

## 2022-12-19 NOTE — Progress Notes (Addendum)
Physical Therapy Treatment Patient Details Name: [name] MRN: [id number] DOB: 1946-08-14 Today's Date: 12/19/2022   History of Present Illness 77 year old male presented to ED 2/6 with c/o shortness of breath and SpO2 dropping to the 70s on his 4L O2 baseline. CT angiogram of chest which showed bilateral peripheral pulmonary emboli, with right heart strain. Admitted for treatment of COPD exacerbation with hypoxia. PMH: COPD, type 2 diabetes mellitus, emphysema, hypertension, RA, osteoarthritis and hypothyroidism, pHTN on Tyvaso and chronic respiratory failure on 4-5L at home, diagnosis of COVID in January    PT Comments    Pt had syncope episode earlier today with mobility tech. Received PT imminent d/c eval and treat orders to re-assess pt for home with [facility] vs SNF for rehab. Pt is currently still displaying symptomatic orthostatic hypotension, limiting him from being able to ambulate this session and placing him at high risk for falls. See below for vitals measurements. Per CM note from today, pt's wife is currently admitted to [facility] and is being worked up for SNF and pt's daughter and sister cannot physically assist him either due to their own medical issues. Due to him being at high risk for falls with poor activity tolerance and limited assistance available at d/c, updating d/c recs to short-term rehab at a SNF. Pt in agreement. Maybe pt could d/c to the same SNF his wife is going to so they can be together? If pt's activity tolerance improves for him to mobilize without physical assistance and with less risk for falls, then may be able to progress to home again. Will continue to follow acutely.  Orthostatics: 130/69 (88) & 102 bpm supine 119/67 (83) & 107 bpm sitting (lightheadedness began) 104/61 (71) & 107 bpm standing 80/57 (65) & 110 bpm standing ~3 min (lightheadedness returned) 128/64 (83) after being returned to supine due to symptoms  SpO2 ranging from 80s-90s% (varied  between devices used) on 4L at rest and 6L with standing       Recommendations for follow up therapy are one component of a multi-disciplinary discharge planning process, led by the attending physician.  Recommendations may be updated based on patient status, additional functional criteria and insurance authorization.  Follow Up Recommendations  Skilled nursing-short term rehab (<3 hours/day) (same one as wife??) Can patient physically be transported by private vehicle: Yes   Assistance Recommended at Discharge Intermittent Supervision/Assistance  Patient can return home with the following A little help with walking and/or transfers;A little help with bathing/dressing/bathroom;Assistance with cooking/housework;Direct supervision/assist for medications management;Direct supervision/assist for financial management;Assist for transportation;Help with stairs or ramp for entrance   Equipment Recommendations  Wheelchair (measurements PT);Wheelchair cushion (measurements PT);Rollator (4 wheels)    Recommendations for Other Services       Precautions / Restrictions Precautions Precautions: Fall Precaution Comments: watch SpO2; watch BP (orthostatic 2/17) Restrictions Weight Bearing Restrictions: No     Mobility  Bed Mobility Overal bed mobility: Modified Independent             General bed mobility comments: Uses bed rail intermittently for bed mobility    Transfers Overall transfer level: Needs assistance Equipment used: Rolling walker (2 wheels), None Transfers: Sit to/from Stand Sit to Stand: Min guard           General transfer comment: Min guard for safety to come to stand from EOB, no LOB    Ambulation/Gait               General Gait Details: deferred due to  symptomatic orthostatic hypotension   Stairs             Wheelchair Mobility    Modified Rankin (Stroke Patients Only)       Balance Overall balance assessment: Needs  assistance Sitting-balance support: Feet supported, No upper extremity supported Sitting balance-Leahy Scale: Good     Standing balance support: Bilateral upper extremity supported, During functional activity, Reliant on assistive device for balance Standing balance-Leahy Scale: Poor Standing balance comment: requires UE support                            Cognition Arousal/Alertness: Awake/alert Behavior During Therapy: WFL for tasks assessed/performed Overall Cognitive Status: Within Functional Limits for tasks assessed                                          Exercises General Exercises - Lower Extremity Short Arc Quad: AROM, Strengthening, Both, 10 reps, Supine Hip ABduction/ADduction: AROM, Strengthening, Both, 10 reps, Supine Straight Leg Raises: AROM, Strengthening, Both, 10 reps, Supine Other Exercises Other Exercises: x10 bridges supine in bed    General Comments General comments (skin integrity, edema, etc.): SpO2 ranging from 80s-90s% (varied between devices used) on 4L at rest and 6L with standing; 130/69 (88) & 102 bpm supine, 119/67 (83) & 107 bpm sitting (lightheadedness began), 104/61 (71) & 107 bpm standing, 80/57 (65) & 110 bpm standing ~3 min (lightheadedness returned), 128/64 (83) after being returned to supine due to symptoms      Pertinent Vitals/Pain Pain Assessment Pain Assessment: No/denies pain    Home Living                          Prior Function            PT Goals (current goals can now be found in the care plan section) Acute Rehab PT Goals Patient Stated Goal: to go to SNF for rehab PT Goal Formulation: With patient Time For Goal Achievement: 12/24/22 Potential to Achieve Goals: Good Progress towards PT goals: Not progressing toward goals - comment (decline due to orthostatic hypotension)    Frequency    Min 3X/week      PT Plan Discharge plan needs to be updated;Equipment recommendations  need to be updated    Co-evaluation              AM-PAC PT "6 Clicks" Mobility   Outcome Measure  Help needed turning from your back to your side while in a flat bed without using bedrails?: None Help needed moving from lying on your back to sitting on the side of a flat bed without using bedrails?: None Help needed moving to and from a bed to a chair (including a wheelchair)?: A Little Help needed standing up from a chair using your arms (e.g., wheelchair or bedside chair)?: A Little Help needed to walk in hospital room?: Total Help needed climbing 3-5 steps with a railing? : Total 6 Click Score: 16    End of Session Equipment Utilized During Treatment: Oxygen;Gait belt Activity Tolerance: Treatment limited secondary to medical complications (Comment);Patient limited by fatigue (orthostatic hypotension) Patient left: in bed;with call bell/phone within reach;with bed alarm set Nurse Communication: Mobility status;Other (comment) (vitals) PT Visit Diagnosis: Unsteadiness on feet (R26.81);Muscle weakness (generalized) (M62.81);Difficulty in walking, not elsewhere classified (R26.2);Dizziness and giddiness (  R42)     [id number] PT Time Calculation (min) (ACUTE ONLY): 24 min  Charges:  $Therapeutic Exercise: 8-22 mins $Therapeutic Activity: 8-22 mins                     Moishe Spice, PT, DPT Acute Rehabilitation Services  Office: [phone]    Orvan Falconer 12/19/2022, 4:30 PM

## 2022-12-19 NOTE — TOC Transition Note (Addendum)
Transition of Care [facility]) - CM/SW Discharge Note   Patient Details  Name: [name] MRN: [id number] Date of Birth: July 07, 1946  Transition of Care [facility]) CM/SW Contact:  Carles Collet, RN Phone Number: 12/19/2022, 1:09 PM   Clinical Narrative:    Patient + COVID 2/5.  Per chart review, DME oxygen active w [employer] prior to admission, and 10L capacity concentrator has been delivered to the home. Patient active w [facility] PTA, I have notified Alwyn Ren, weekend liaison of DC. Previouse charting shows family will transport home, I have asked nurse to check and make sure he has tanks for transport in his room. Ordered WC for DC, it will be delivered to the room today  Spoke w liaison from [facility] and they state that they will not take the patient for [facility] services because the therapist at home thought he was not appropriate for [facility].   Spoke w wife, Mellody Dance Volcy, who states that she is admitted to [facility] and is being worked up for SNF. I have reached to [facility] CM Storm Frisk.   Spoke w the patient, he states that he his wife, daughter and sister live in the home together. Daughter Alwyn Ren [phone] is available to discuss DC planning.   Alwyn Ren, daughter,  states that she has a foot problem and is not able to care for her father as she would not be able to pick him up (if he falls), not able to help him to the bathroom as she has an issue with her foot and ankle and that her aunt would not be able to assist physically either.   Upon ambulating with mobility today patient experienced dizziness and quick deterioration, with syncopal episode. Discussed with attending and DC is on hold.  Patient will need re-evaluated by PT and possible SNF placement if he does not become more independent with better activity tolerance for home. PT and OT orders for immanent DC placed.    Final next level of care: Home w Home Health Services Barriers to Discharge: No Barriers Identified   Patient Goals and  CMS Choice CMS Medicare.gov Compare Post Acute Care list provided to:: Patient Choice offered to / list presented to : Patient  Discharge Placement                         Discharge Plan and Services Additional resources added to the After Visit Summary for   In-house Referral: NA Discharge Planning Services: CM Consult Post Acute Care Choice: Home Health          DME Arranged: N/A DME Agency: NA       HH Arranged: PT HH Agency: [place] Date [place]: 12/19/22 Time [place]: [id number] Representative spoke with at [place]: [place] Determinants of Health ([place]) Interventions SDOH Screenings   Food Insecurity: No Food Insecurity (06/01/2019)  Housing: Low Risk  (06/01/2019)  Depression (PHQ2-9): Low Risk  (06/01/2019)  Financial Resource Strain: Low Risk  (06/01/2019)  Physical Activity: Inactive (06/01/2019)  Stress: No Stress Concern Present (06/01/2019)  Tobacco Use: Medium Risk (10/23/2022)     Readmission Risk Interventions    12/08/2022    3:54 PM  Readmission Risk Prevention Plan  Transportation Screening Complete  PCP or Specialist Appt within 3-5 Days Complete  HRI or [place] Complete  Social Work Consult for [place] Planning/Counseling Complete  Palliative Care Screening Not Applicable  Medication Review Press photographer) Complete

## 2022-12-19 NOTE — Progress Notes (Addendum)
Patient had syncopal episode while working with PT.  Transient loss of consciousness about 15 seconds. Orthostatic vital signs were checked, SBP dropped to 70s on standing. Will hold discharge. Patient might have to go to skilled nursing facility. Not a safe discharge at this time. Will discontinue [facility] [place] TED hose

## 2022-12-19 NOTE — Progress Notes (Signed)
Mobility Specialist Progress Note   12/19/22 1225  Mobility  Activity Ambulated with assistance in hallway  Level of Assistance Minimal assist, patient does 75% or more  Assistive Device Front wheel walker  Distance Ambulated (ft) 20 ft  Range of Motion/Exercises Active;All extremities  Activity Response Tolerated poorly;RN notified   Patient received dangling EOB and agreeable to participate. Prior to ambulation, complained of lightheadedness that subsided quickly. Oxygen saturation with inconsistent plethora but reading 88-91%. Ambulated in hallway at min guard with slow gait. During ambulation patient c/o dizziness requiring seated rest break. Was unable to obtain VS secondary to severe dizziness and quick deterioration. Writer requested +2 assistance from nursing staff to get patient back in room. Patient then became gradually unresponsive and then had syncopal episode. NT and writer carried patient back to room with RN present. VS obtained and noted hypotensive BP 100/39 and oxygen saturating >88%. Was left in the care of the RN with all needs met. MD notified.  [place] Dorian Renfro, BS EXP Mobility Specialist Please contact via SecureChat or Rehab office at [phone]

## 2022-12-19 NOTE — Discharge Summary (Signed)
Physician Discharge Summary   Patient: [name] MRN: [id number] DOB: 1946/02/28  Admit date:     12/07/2022  Discharge date: 12/19/22  Discharge Physician: Oswald Hillock   PCP: Merrilee Seashore, MD   Recommendations at discharge:   Follow-up pulmonology as outpatient  Discharge Diagnoses: Principal Problem:   Acute and chronic respiratory failure with hypoxia ([place]) Active Problems:   COVID-19 virus infection   Pulmonary hypertension (HCC)   COPD with acute exacerbation (HCC)   Pancytopenia (HCC)   Lobar pneumonia (HCC)   Thrombocytopenia (HCC)   Rheumatoid arthritis (HCC)   Hypothyroidism   Dyslipidemia   Essential hypertension   Hypomagnesemia   Hypokalemia   Elevated troponin I level   Type 2 diabetes mellitus (HCC)   Pressure injury of skin   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  Resolved Problems:   * No resolved hospital problems. [facility] Course:  Mr. Cookson is a 77 y.o. M with RA, ILD on Actemra and Imuran, emphysema and pHTN on Tyvaso and chronic respiratory failure on 4-5L at home, as well as recent COVID complicated by pancytopenia who presented with dyspnea and respiratory distress.      1/5-1/12: Admitted to [facility] for COVID, ABG showed pO2 36 on home 4L admitted to ICU on [place] and BiPAP; improved on steroids and discharged on Augmentin  1/21-1/29: Readmitted to [facility] with SOB/fever, this time found to have pancytopenia platelets 50K; treated with cefepime x9 days and discharged again home with [facility] on prednisone   2/5: Readmitted for respiratory distress 2/6: CCM consulted, recommended heparin, steroids 2/7: Echo shows stable cor pulmonale and pHTN 2/8: DVT study shows L gastroc DVT 2/9: Switched to apixaban, weaned to 6L at rest, but still 10L with ambulation 2/10: Still on 12L O2 with ambulation, Pulmonary consulted 12/14/2022: Patient is requiring 5L O2 by [place]. 12/15/2022: The patient is currently requiring 8L. Pulmonary has signed off.    Assessment and Plan:   Acute respiratory failure with hypoxia (HCC) - Secondary submassive PE, also has underlying ILD - Also started on remdesivir for COVID-19 infection -PCCM was consulted, recommend to continue current treatment -Also recommend palliative care for goals of care discussion -Diuresed with with IV Lasix given yesterday -Also received p.o. Lasix with good diuretic response -At this time requiring 4 to 5 L of oxygen via nasal cannula which is patient's baseline   COPD with acute exacerbation (HCC) - Continue steroids. - Continue bronchodilators.     COVID-19 virus infection Tested positive in Nov 06, 2022.  Given he is on Rituxan, have to consider ongoing COVID infection. - Continue remdesivir for total 14 days, last day 12/26/22 - Airborne precautions -  COVID IGg Abs was nonreactive due to Rituxan -Discussed with ID Dr. Karolee Ohs and pulmonology Dr. Etta Quill; will discontinue remdesivir today.  Patient be discharged home on Paxlovid for 5 more days. -Repeat SARS-CoV-2 RT-PCR was negative   Dyslipidemia - Hold lipitor   Essential hypertension - Hold amlodipine given soft BP   Hypomagnesemia Supplement and monitor.   Hypokalemia Replete   Elevated troponin I level Likely myocardial injury due to COVID, ischemia ruled out   Type 2 diabetes mellitus (HCC) Glucose controlled - Continue home regimen     Acute pulmonary embolism (HCC) - Continue apixaban - Consulted IR for thrombectomy without tPA - The patient is not a candidate for catheter directed thrombectomy.   Thrombocytopenia ([place]) See below   Rheumatoid arthritis ([place]) - Hold Actemra, Imuran -  Continue steroids -Follow-up rheumatology as outpatient   Pressure injury of skin  Sacrum, stage II, present on admission. Wound care consulted.   Pulmonary hypertension ([place]) Echo shows severe pulmonary hypertension and TR, estimated pressures may be higher than last echo.  Got Lasix  2/9 - Consulted Pulmonology  - Continue Treprostinil. -Follow-up pulmonology as outpatient     Hypothyroidism - Continue levothyroxine   Pancytopenia ([place]) This first was noted mid Jan during admission at [facility], again WBC <4, Hgb <8, and platelets <50.  Suspect bone marrow suppression from COVID, in setting of Imuran   B12, folate, smear unremarkable.  Retics hypoproliferative. - Hold Imuran now and at discharge - Follow up with Hematology and Rheumatology and Pulmonology on discharge.   Lobar pneumonia ([place]) CT chest showed RIGHT basilar basilar dependent consolidation suspicious for pneumonia.   - Completed treatment with IV Rocephin - Continue aggressive pulmonary toilet   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 - Continue steroids - Hold Imuran - Pulmonology  has signed off -Palliative care consulted for goals of care, patient wants to follow-up with palliative care as outpatient         Consultants: Pulmonology Procedures performed: Echocardiogram Disposition: Home Diet recommendation:  Discharge Diet Orders (From admission, onward)     Start     Ordered   12/19/22 0000  Diet - low sodium heart healthy        12/19/22 1236           Regular diet DISCHARGE MEDICATION: Allergies as of 12/19/2022       Reactions   Bactrim [sulfamethoxazole-trimethoprim] Shortness Of Breath   Patient states "got short winded"   Spiriva Respimat [tiotropium Bromide Monohydrate] Rash   Ofev [nintedanib] Other (See Comments)   Unknown         Medication List     STOP taking these medications    Actemra ACTPen 162 MG/0.9ML [place] Generic drug: Tocilizumab   aspirin 81 MG tablet       TAKE these medications    acetaminophen 500 MG tablet Commonly known as: TYLENOL Take 1,000 mg by mouth 2 (two) times daily as needed for moderate pain or headache.   albuterol 108 (90 Base) MCG/ACT inhaler Commonly known as: VENTOLIN HFA Inhale 4-5 puffs into the lungs as  needed for wheezing or shortness of breath.   alfuzosin 10 MG 24 hr tablet Commonly known as: UROXATRAL Take 1 tablet (10 mg total) by mouth daily with breakfast.   amLODipine 10 MG tablet Commonly known as: NORVASC Take 10 mg by mouth daily.   apixaban 2.5 MG Tabs tablet Commonly known as: ELIQUIS Take 1 tablet (2.5 mg total) by mouth 2 (two) times daily for 5 days.   apixaban 5 MG Tabs tablet Commonly known as: ELIQUIS Take 1 tablet (5 mg total) by mouth 2 (two) times daily. Start taking this medication from 12/24/2022 after you have completed Eliquis 2.5 mg twice a day for 5 days. Start taking on: December 24, 2022   atorvastatin 20 MG tablet Commonly known as: LIPITOR Take 20 mg by mouth daily.   azaTHIOprine 50 MG tablet Commonly known as: IMURAN Take 50 mg by mouth daily.   finasteride 5 MG tablet Commonly known as: PROSCAR Take 1 tablet (5 mg total) by mouth daily.   fluticasone 50 MCG/ACT nasal spray Commonly known as: FLONASE Place 2 sprays into both nostrils daily.   folic acid A999333 MCG tablet Commonly known as: FOLVITE Take 400 mcg by  mouth daily.   Incruse Ellipta 62.5 MCG/ACT Aepb Generic drug: umeclidinium bromide Inhale 1 puff into the lungs daily.   insulin NPH Human 100 UNIT/ML injection Commonly known as: NOVOLIN N Inject 2-3 Units into the skin daily as needed (for high blood sugar).   ipratropium-albuterol 0.5-2.5 (3) MG/3ML Soln Commonly known as: DUONEB Inhale 3 mLs into the lungs as needed (wheezing/SOB).   levothyroxine 25 MCG tablet Commonly known as: SYNTHROID Take 25 mcg by mouth daily.   metFORMIN 500 MG 24 hr tablet Commonly known as: GLUCOPHAGE-XR Take 2 tablets (1,000 mg total) by mouth 2 (two) times daily. What changed: how much to take   nirmatrelvir/ritonavir 20 x 150 MG & 10 x 100MG Tabs Commonly known as: PAXLOVID Take 3 tablets by mouth 2 (two) times daily for 5 days. Patient GFR is >60. Take nirmatrelvir (150 mg) two  tablets twice daily for 5 days and ritonavir (100 mg) one tablet twice daily for 5 days.   pantoprazole 40 MG tablet Commonly known as: PROTONIX Take 40 mg by mouth daily.   predniSONE 1 MG tablet Commonly known as: DELTASONE Take 3 tablets (3 mg total) by mouth daily.   Tyvaso DPI Maintenance Kit 48 MCG Powd Generic drug: Treprostinil Inhale 48 mcg into the lungs in the morning, at noon, in the evening, and at bedtime.               Durable Medical Equipment  (From admission, onward)           Start     Ordered   12/11/22 1605  DME Oxygen  (Discharge Planning)  Once       Question Answer Comment  Length of Need Lifetime   Mode or (Route) Nasal cannula   Liters per Minute 10   Frequency Continuous (stationary and portable oxygen unit needed)   Oxygen delivery system Gas      12/11/22 1604            Follow-up Information     Merrilee Seashore, MD Follow up.   Specialty: Internal Medicine Contact information: [address] [place] [address] [place] Follow up.   Specialty: Hospice Why: outpatient palliative services. Contact information: [address] [place] [phone]               Discharge Exam: Danley Danker Weights   12/15/22 0600 12/16/22 0540 12/18/22 1201  Weight: 65.2 kg 67.7 kg 60.1 kg   General-appears in no acute distress Heart-S1-S2, regular, no murmur auscultated Lungs-clear to auscultation bilaterally, no wheezing or crackles auscultated Abdomen-soft, nontender, no organomegaly Extremities-no edema in the lower extremities Neuro-alert, oriented x3, no focal deficit noted  Condition at discharge: good  The results of significant diagnostics from this hospitalization (including imaging, microbiology, ancillary and laboratory) are listed below for reference.   Imaging Studies: VAS [place] LOWER EXTREMITY VENOUS (DVT)  Result Date:  12/11/2022  Lower Venous DVT Study Patient Name:  SAW PERELLI  Date of Exam:   12/10/2022 Medical Rec #: [id number]     Accession #:    [id number] Date of Birth: [birth year]     Patient Gender: M Patient Age:   77 years Exam Location:  [facility] Procedure:      VAS [place] LOWER EXTREMITY VENOUS (DVT) Referring Phys: Andres Labrum --------------------------------------------------------------------------------  Indications: Pulmonary embolism.  Risk Factors: Confirmed PE. Limitations: Poor ultrasound/tissue interface and patient positioning.  Comparison Study: No prior studies. Performing Technologist: Oliver Hum RVT  Examination Guidelines: A complete evaluation includes B-mode imaging, spectral Doppler, color Doppler, and power Doppler as needed of all accessible portions of each vessel. Bilateral testing is considered an integral part of a complete examination. Limited examinations for reoccurring indications may be performed as noted. The reflux portion of the exam is performed with the patient in reverse Trendelenburg.  +---------+---------------+---------+-----------+----------+--------------+ RIGHT    CompressibilityPhasicitySpontaneityPropertiesThrombus Aging +---------+---------------+---------+-----------+----------+--------------+ CFV      Full           Yes      No                                  +---------+---------------+---------+-----------+----------+--------------+ SFJ      Full                                                        +---------+---------------+---------+-----------+----------+--------------+ FV Prox  Full                                                        +---------+---------------+---------+-----------+----------+--------------+ FV Mid   Full                                                        +---------+---------------+---------+-----------+----------+--------------+ FV DistalFull                                                         +---------+---------------+---------+-----------+----------+--------------+ PFV      Full                                                        +---------+---------------+---------+-----------+----------+--------------+ POP      Full           Yes      No                                  +---------+---------------+---------+-----------+----------+--------------+ PTV      Full                                                        +---------+---------------+---------+-----------+----------+--------------+ PERO     Full                                                        +---------+---------------+---------+-----------+----------+--------------+   +---------+---------------+---------+-----------+----------+--------------+  LEFT     CompressibilityPhasicitySpontaneityPropertiesThrombus Aging +---------+---------------+---------+-----------+----------+--------------+ CFV      Full           Yes      No                                  +---------+---------------+---------+-----------+----------+--------------+ SFJ      Full                                                        +---------+---------------+---------+-----------+----------+--------------+ FV Prox  Full                                                        +---------+---------------+---------+-----------+----------+--------------+ FV Mid   Full                                                        +---------+---------------+---------+-----------+----------+--------------+ FV DistalFull                                                        +---------+---------------+---------+-----------+----------+--------------+ PFV      Full                                                        +---------+---------------+---------+-----------+----------+--------------+ POP      Full           Yes      No                                   +---------+---------------+---------+-----------+----------+--------------+ PTV      Full                                                        +---------+---------------+---------+-----------+----------+--------------+ PERO     Full                                                        +---------+---------------+---------+-----------+----------+--------------+ Gastroc  Partial                                      Acute          +---------+---------------+---------+-----------+----------+--------------+  Summary: RIGHT: - There is no evidence of no deep vein thrombosis in the lower extremity.  - No cystic structure found in the popliteal fossa.  LEFT: - Findings consistent with acute calf muscle vein thrombosis involving the left gastrocnemius veins. - No cystic structure found in the popliteal fossa.  *See table(s) above for measurements and observations. Electronically signed by Orlie Pollen on 12/11/2022 at 1:34:21 PM.    Final    ECHOCARDIOGRAM COMPLETE  Result Date: 12/09/2022    ECHOCARDIOGRAM REPORT   Patient Name:   ZEKHI NOGGLE Date of Exam: 12/09/2022 Medical Rec #:  [id number]    Height:       66.0 in Accession #:    [id number]   Weight:       147.0 lb Date of Birth:  04-08-1946    BSA:          1.755 m Patient Age:    49 years     BP:           108/65 mmHg Patient Gender: M            HR:           71 bpm. Exam Location:  Inpatient Procedure: 2D Echo, Cardiac Doppler and Color Doppler Indications:    Pulmonary embolus  History:        Patient has prior history of Echocardiogram examinations, most                 recent 11/15/2020. COPD; Risk Factors:Hypertension and Diabetes.                 COVID-19.  Sonographer:    Clayton Lefort RDCS (AE) Referring Phys: [id number] [place]  1. Left ventricular ejection fraction, by estimation, is 50 to 55%. The left ventricle has low normal function. The left ventricle has no regional wall motion abnormalities. There is mild left  ventricular hypertrophy. Left ventricular diastolic parameters are consistent with Grade I diastolic dysfunction (impaired relaxation). There is the interventricular septum is flattened in systole, consistent with right ventricular pressure overload.  2. Right ventricular systolic function is moderately reduced. The right ventricular size is moderately enlarged. Moderately increased right ventricular wall thickness. There is severely elevated pulmonary artery systolic pressure. The estimated right ventricular systolic pressure is 99991111 mmHg.  3. A small pericardial effusion is present.  4. The mitral valve is normal in structure. Trivial mitral valve regurgitation. No evidence of mitral stenosis.  5. Tricuspid valve regurgitation is moderate to severe.  6. The aortic valve is tricuspid. Aortic valve regurgitation is trivial. No aortic stenosis is present.  7. The inferior vena cava is dilated in size with <50% respiratory variability, suggesting right atrial pressure of 15 mmHg. FINDINGS  Left Ventricle: Left ventricular ejection fraction, by estimation, is 50 to 55%. The left ventricle has low normal function. The left ventricle has no regional wall motion abnormalities. The left ventricular internal cavity size was normal in size. There is mild left ventricular hypertrophy. The interventricular septum is flattened in systole, consistent with right ventricular pressure overload. Left ventricular diastolic parameters are consistent with Grade I diastolic dysfunction (impaired relaxation). Right Ventricle: The right ventricular size is moderately enlarged. Moderately increased right ventricular wall thickness. Right ventricular systolic function is moderately reduced. There is severely elevated pulmonary artery systolic pressure. The tricuspid regurgitant velocity is 4.49 m/s, and with an assumed right atrial pressure of 15 mmHg, the estimated right ventricular systolic pressure is 99991111 mmHg. Left Atrium: Left  atrial  size was normal in size. Right Atrium: Right atrial size was normal in size. Pericardium: A small pericardial effusion is present. Mitral Valve: The mitral valve is normal in structure. Trivial mitral valve regurgitation. No evidence of mitral valve stenosis. Tricuspid Valve: The tricuspid valve is normal in structure. Tricuspid valve regurgitation is moderate to severe. Aortic Valve: The aortic valve is tricuspid. Aortic valve regurgitation is trivial. No aortic stenosis is present. Aortic valve mean gradient measures 2.0 mmHg. Aortic valve peak gradient measures 3.2 mmHg. Aortic valve area, by VTI measures 2.96 cm. Pulmonic Valve: The pulmonic valve was not well visualized. Pulmonic valve regurgitation is trivial. Aorta: The aortic root and ascending aorta are structurally normal, with no evidence of dilitation. Venous: The inferior vena cava is dilated in size with less than 50% respiratory variability, suggesting right atrial pressure of 15 mmHg. IAS/Shunts: The interatrial septum was not well visualized.  LEFT VENTRICLE PLAX 2D LVIDd:         4.30 cm   Diastology LVIDs:         3.10 cm   LV e' medial:    4.46 cm/s LV PW:         1.20 cm   LV E/e' medial:  10.1 LV IVS:        1.10 cm   LV e' lateral:   5.33 cm/s LVOT diam:     2.30 cm   LV E/e' lateral: 8.4 LV SV:         58 LV SV Index:   33 LVOT Area:     4.15 cm  RIGHT VENTRICLE            IVC RV Basal diam:  3.30 cm    IVC diam: 2.30 cm RV S prime:     9.25 cm/s TAPSE (M-mode): 1.7 cm LEFT ATRIUM             Index        RIGHT ATRIUM           Index LA diam:        3.30 cm 1.88 cm/m   RA Area:     14.80 cm LA Vol (A2C):   30.9 ml 17.61 ml/m  RA Volume:   35.90 ml  20.46 ml/m LA Vol (A4C):   56.2 ml 32.03 ml/m LA Biplane Vol: 45.4 ml 25.87 ml/m  AORTIC VALVE AV Area (Vmax):    2.99 cm AV Area (Vmean):   2.82 cm AV Area (VTI):     2.96 cm AV Vmax:           90.00 cm/s AV Vmean:          58.100 cm/s AV VTI:            0.195 m AV Peak Grad:      3.2  mmHg AV Mean Grad:      2.0 mmHg LVOT Vmax:         64.80 cm/s LVOT Vmean:        39.400 cm/s LVOT VTI:          0.139 m LVOT/AV VTI ratio: 0.71  AORTA Ao Root diam: 3.70 cm Ao Asc diam:  3.00 cm MITRAL VALVE               TRICUSPID VALVE MV Area (PHT): 2.54 cm    TR Peak grad:   80.6 mmHg MV Decel Time: 299 msec    TR Vmax:  449.00 cm/s MV E velocity: 45.00 cm/s MV A velocity: 68.10 cm/s  SHUNTS MV E/A ratio:  0.66        Systemic VTI:  0.14 m                            Systemic Diam: 2.30 cm Oswaldo Milian MD Electronically signed by Oswaldo Milian MD Signature Date/Time: 12/09/2022/5:06:20 PM    Final    CT Angio Chest Pulmonary Embolism (PE) W or WO Contrast  Result Date: 12/08/2022 CLINICAL DATA:  Pulmonary embolism (PE) suspected, high prob EXAM: CT ANGIOGRAPHY CHEST WITH CONTRAST TECHNIQUE: Multidetector CT imaging of the chest was performed using the standard protocol during bolus administration of intravenous contrast. Multiplanar CT image reconstructions and MIPs were obtained to evaluate the vascular anatomy. RADIATION DOSE REDUCTION: This exam was performed according to the departmental dose-optimization program which includes automated exposure control, adjustment of the mA and/or kV according to patient size and/or use of iterative reconstruction technique. CONTRAST:  29m OMNIPAQUE IOHEXOL 350 MG/ML SOLN COMPARISON:  Chest XR, 12/07/2022.  CT chest, 06/10/2022. FINDINGS: Cardiovascular: *Satisfactory opacification of the pulmonary arteries to the segmental level. *POSITIVE for bilateral pulmonary emboli, at the inferior basilar posterior segmental pulmonary arteries. *CT evidence of RIGHT heart strain, with RV/LV ratio of 1.8 *Dilated main PA, measuring 3.2 cm. *RIGHT heart enlargement.  Small pericardial effusion. *Moderate-to-severe burden of multivessel coronary atherosclerosis, greatest within the LAD. Mediastinum/Nodes: No enlarged mediastinal, hilar, or axillary lymph nodes.  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 Trachea, and esophagus demonstrate no significant findings. Lungs/Pleura: *Similar pulmonary findings with basilar-predominant honeycombing with pulmonary interstitial thickening and apical-predominant ground-glass opacities. *Superimposed air-fluid levels within the pulmonary cysts, greatest within the dependent RIGHT basilar lung. *Mild RIGHT basilar basilar dependent consolidation. No discrete mass or suspicious pulmonary nodule. *No pleural effusion or pneumothorax. Upper Abdomen: No acute abnormality. Intraluminal contrast opacification of the stomach, likely previously ingested. Musculoskeletal: No acute chest wall abnormality. No acute or significant osseous findings. Review of the MIP images confirms the above findings. IMPRESSION: 1. Bilateral inferior basilar posterior segmental pulmonary emboli. Examination is POSITIVE for acute PE with CT evidence of right heart strain (RV/LV Ratio = 1.8) consistent with at least submassive (intermediate risk) PE. The presence of right heart strain has been associated with an increased risk of morbidity and mortality. Please refer to the "Code PE Focused" order set in EPIC. 2. Main pulmonary artery dilation, likely reflecting underlying pulmonary arterial hypertension. 3. Chronic interstitial lung findings, relatively similar in degree to comparison high-resolution CT chest (06/10/2022). Air-fluid levels and basilar dependent consolidation is suspicious for superimposed aspiration pneumonia. 4. 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 Recommend non-emergent, outpatient [place] for further evaluation. 5. Coronary atherosclerosis. Additional incidental, chronic and senescent findings as above. These results will be called to the ordering clinician or representative by the Radiologist Assistant, and communication documented in the PACS or [employer] Electronically Signed   By: JMichaelle BirksM.D.   On:  12/08/2022 12:03   DG Chest 2 View  Result Date: 12/07/2022 CLINICAL DATA:  Increased short of breath EXAM: CHEST - 2 VIEW COMPARISON:  05/13/2022, CT 06/10/2022 FINDINGS: Chronic reticular interstitial opacity corresponding to fibrosis and chronic lung disease. No acute airspace disease or pleural effusion. Stable cardiomediastinal silhouette. No pneumothorax. IMPRESSION: No active cardiopulmonary disease. Chronic interstitial lung disease. Electronically Signed   By: KDonavan FoilM.D.   On: 12/07/2022 21:51  Microbiology: Results for orders placed or performed during the hospital encounter of 12/07/22  Resp panel by RT-PCR (RSV, Flu A&B, Covid) Anterior Nasal Swab     Status: Abnormal   Collection Time: 12/07/22  8:32 PM   Specimen: Anterior Nasal Swab  Result Value Ref Range Status   SARS Coronavirus 2 by RT PCR POSITIVE (A) NEGATIVE Final   Influenza A by PCR NEGATIVE NEGATIVE Final   Influenza B by PCR NEGATIVE NEGATIVE Final    Comment: (NOTE) The Xpert Xpress SARS-CoV-2/FLU/RSV plus assay is intended as an aid in the diagnosis of influenza from Nasopharyngeal swab specimens and should not be used as a sole basis for treatment. Nasal washings and aspirates are unacceptable for Xpert Xpress SARS-CoV-2/FLU/RSV testing.  Fact Sheet for Patients: EntrepreneurPulse.com.au  Fact Sheet for Healthcare Providers: IncredibleEmployment.be  This test is not yet approved or cleared by the [place] FDA and has been authorized for detection and/or diagnosis of SARS-CoV-2 by FDA under an Emergency Use Authorization (EUA). This EUA will remain in effect (meaning this test can be used) for the duration of the COVID-19 declaration under Section 564(b)(1) of the Act, 21 U.S.C. section 360bbb-3(b)(1), unless the authorization is terminated or revoked.     Resp Syncytial Virus by PCR NEGATIVE NEGATIVE Final    Comment: (NOTE) Fact Sheet for  Patients: EntrepreneurPulse.com.au  Fact Sheet for Healthcare Providers: IncredibleEmployment.be  This test is not yet approved or cleared by the [place] FDA and has been authorized for detection and/or diagnosis of SARS-CoV-2 by FDA under an Emergency Use Authorization (EUA). This EUA will remain in effect (meaning this test can be used) for the duration of the COVID-19 declaration under Section 564(b)(1) of the Act, 21 U.S.C. section 360bbb-3(b)(1), unless the authorization is terminated or revoked.  Performed at [facility], [place] [address] [place], [place] [address]   Resp panel by RT-PCR (RSV, Flu A&B, Covid) Anterior Nasal Swab     Status: None   Collection Time: 12/18/22 12:36 PM   Specimen: Anterior Nasal Swab  Result Value Ref Range Status   SARS Coronavirus 2 by RT PCR NEGATIVE NEGATIVE Final   Influenza A by PCR NEGATIVE NEGATIVE Final   Influenza B by PCR NEGATIVE NEGATIVE Final    Comment: (NOTE) The Xpert Xpress SARS-CoV-2/FLU/RSV plus assay is intended as an aid in the diagnosis of influenza from Nasopharyngeal swab specimens and should not be used as a sole basis for treatment. Nasal washings and aspirates are unacceptable for Xpert Xpress SARS-CoV-2/FLU/RSV testing.  Fact Sheet for Patients: EntrepreneurPulse.com.au  Fact Sheet for Healthcare Providers: IncredibleEmployment.be  This test is not yet approved or cleared by the [place] FDA and has been authorized for detection and/or diagnosis of SARS-CoV-2 by FDA under an Emergency Use Authorization (EUA). This EUA will remain in effect (meaning this test can be used) for the duration of the COVID-19 declaration under Section 564(b)(1) of the Act, 21 U.S.C. section 360bbb-3(b)(1), unless the authorization is terminated or revoked.     Resp Syncytial Virus by PCR NEGATIVE NEGATIVE Final    Comment: (NOTE) Fact Sheet  for Patients: EntrepreneurPulse.com.au  Fact Sheet for Healthcare Providers: IncredibleEmployment.be  This test is not yet approved or cleared by the [place] FDA and has been authorized for detection and/or diagnosis of SARS-CoV-2 by FDA under an Emergency Use Authorization (EUA). This EUA will remain in effect (meaning this test can be used) for the duration of the COVID-19 declaration under Section 564(b)(1)  of the Act, 21 U.S.C. section 360bbb-3(b)(1), unless the authorization is terminated or revoked.  Performed at [facility], [place] [address] [place], [place] [address]     Labs: CBC: Recent Labs  Lab 12/13/22 0055 12/14/22 0104 12/15/22 0959 12/17/22 0051  WBC 2.1* 1.8* 1.6* 2.3*  NEUTROABS  --   --  1.0*  --   HGB 9.6* 7.6* 9.1* 9.4*  HCT 30.7* 24.3* 29.0* 29.6*  MCV 86.5 86.8 86.1 85.3  PLT 69* 61* 54* 84*   Basic Metabolic Panel: Recent Labs  Lab 12/13/22 0055 12/14/22 0104 12/17/22 0051 12/18/22 0101  NA 143 141 136 139  K 3.3* 3.8 4.1 3.9  CL 100 100 94* 93*  CO2 31 31 29 $ 33*  GLUCOSE 36* 93 122* 127*  BUN 18 19 19 21  $ CREATININE 0.95 0.80 0.89 0.95  CALCIUM 8.3* 7.9* 7.5* 7.9*   Liver Function Tests: Recent Labs  Lab 12/13/22 0055 12/14/22 0104 12/17/22 0051 12/18/22 0101  AST 18 13* 18 21  ALT 26 19 19 22  $ ALKPHOS 39 33* 27* 36*  BILITOT 0.4 0.3 0.9 1.0  PROT 5.3* 3.9* 3.9* 4.5*  ALBUMIN 2.6* 2.0* 1.9* 2.3*   CBG: Recent Labs  Lab 12/18/22 2236 12/19/22 0040 12/19/22 0437 12/19/22 0759 12/19/22 1158  GLUCAP 101* 187* 113* 79 170*    Discharge time spent: greater than 30 minutes.  Signed: Oswald Hillock, MD [facility] 12/19/2022

## 2022-12-19 NOTE — Care Management (Cosign Needed)
    Durable Medical Equipment  (From admission, onward)           Start     Ordered   12/19/22 1340  For home use only DME lightweight manual wheelchair with seat cushion  Once       Comments: Patient suffers from weakness which impairs their ability to perform daily activities like bathing in the home.  A cane will not resolve  issue with performing activities of daily living. A wheelchair will allow patient to safely perform daily activities. Patient is not able to propel themselves in the home using a standard weight wheelchair due to endurance. Patient can self propel in the lightweight wheelchair. Length of need Lifetime. Accessories: elevating leg rests (ELRs), wheel locks, extensions and anti-tippers.   12/19/22 1339   12/11/22 1605  DME Oxygen  (Discharge Planning)  Once       Question Answer Comment  Length of Need Lifetime   Mode or (Route) Nasal cannula   Liters per Minute 10   Frequency Continuous (stationary and portable oxygen unit needed)   Oxygen delivery system Gas      12/11/22 1604

## 2022-12-20 LAB — GLUCOSE, CAPILLARY
Glucose-Capillary: 100 mg/dL — ABNORMAL HIGH (ref 70–99)
Glucose-Capillary: 106 mg/dL — ABNORMAL HIGH (ref 70–99)
Glucose-Capillary: 117 mg/dL — ABNORMAL HIGH (ref 70–99)
Glucose-Capillary: 187 mg/dL — ABNORMAL HIGH (ref 70–99)
Glucose-Capillary: 313 mg/dL — ABNORMAL HIGH (ref 70–99)
Glucose-Capillary: 45 mg/dL — ABNORMAL LOW (ref 70–99)
Glucose-Capillary: 64 mg/dL — ABNORMAL LOW (ref 70–99)
Glucose-Capillary: 67 mg/dL — ABNORMAL LOW (ref 70–99)

## 2022-12-20 NOTE — Progress Notes (Signed)
[facility]  PROGRESS NOTE  Adel Allemang [id number] DOB: [birth year] DOA: 12/07/2022 PCP: Merrilee Seashore, MD   Brief HPI:   77 year old male with history of rheumatoid arthritis, ILD on Actemra and Imuran, emphysema, pulmonary hypertension on Tyvaso, chronic respiratory failure on 4 to 5 L oxygen at home, recent COVID infection complicated by pancytopenia presented with dyspnea and respiratory distress.  1/5-1/12: Admitted to [facility] for COVID infection, ABG showed pO2 36 on home 4L admitted to ICU on [facility] and BiPAP; improved on steroids and discharged on Augmentin   1/21-1/29: Readmitted to [facility] with SOB/fever, this time found to have pancytopenia platelets 50K; treated with cefepime x9 days and discharged again home with [facility] on prednisone     2/5: Readmitted for respiratory distress; CTA chest showed bilateral inferior basilar posterior segmental pulmonary emboli 2/6: CCM consulted, recommended heparin, steroids 2/7: Echo shows stable cor pulmonale and pHTN 2/8: DVT study shows L gastroc DVT 2/9: Switched to apixaban, weaned to 6L at rest, but still 10L with ambulation 2/10: Still on 12L O2 with ambulation, Pulmonary consulted 12/14/2022: Patient is requiring 5L O2 by [place]. 12/15/2022: The patient's oxygen requirements have increased to 8L. Pulmonary has signed off.     Subjective   Patient seen and examined, discharge was held yesterday as patient had syncopal episode while walking with physical therapy.  Blood pressure was low.  Lasix was held.  TED hose ordered.   Assessment/Plan:     Acute respiratory failure with hypoxia (HCC) - Secondary submassive PE, also has underlying ILD - Also started on remdesivir for COVID-19 infection -PCCM was consulted, recommend to continue current treatment -Also recommend palliative care for goals of care discussion -Diuresed with with IV Lasix given yesterday -He was started on Lasix 20 mg daily which was held due to  syncope  Syncope - discharge was held yesterday as patient had syncopal episode while walking with physical therapy.  Blood pressure was low.  Lasix was held.  TED hose ordered. -Positive orthostatic hypotension -Resolved; blood pressure has improved  COPD with acute exacerbation (HCC) - Continue steroids. - Continue bronchodilators.     COVID-19 virus infection Tested positive in Nov 06, 2022.  Given he is on Rituxan, have to consider ongoing COVID infection. - Continue remdesivir for total 14 days, last day 12/26/22 - Airborne precautions -  COVID IGg Abs was nonreactive due to Rituxan -Discussed with ID Dr. Karolee Ohs and pulmonology Dr. Etta Quill; will discontinue remdesivir today.  Patient will be be discharged home on Paxlovid for 5 more days. -Discharge canceled, patient to go to skilled nursing facility for rehab   Dyslipidemia - Hold lipitor   Essential hypertension - Hold amlodipine given soft BP   Hypomagnesemia Supplement and monitor.   Hypokalemia Replete   Elevated troponin I level Likely myocardial injury due to COVID, ischemia ruled out   Type 2 diabetes mellitus (HCC) Glucose controlled - Hold metformin and NPH - Continue SS corrections     Acute pulmonary embolism (HCC) - Continue apixaban - Consulted IR for thrombectomy without tPA - The patient is not a candidate for catheter directed thrombectomy.   Thrombocytopenia ([place]) See below   Rheumatoid arthritis ([place]) - Hold Actemra, Imuran - Continue steroids   Pressure injury of skin  Sacrum, stage II, present on admission. Wound care consulted.   Pulmonary hypertension ([place]) Echo shows severe pulmonary hypertension and TR, estimated pressures may be higher than last echo.  Got Lasix 2/9 - Consult Pulmonology appreciate cares. -  Continue Treprostinil.     Hypothyroidism - Continue levothyroxine   Pancytopenia ([place]) This first was noted mid Jan during admission at [facility], again  WBC <4, Hgb <8, and platelets <50.  Suspect bone marrow suppression from COVID, in setting of Imuran   B12, folate, smear unremarkable.  Retics hypoproliferative. - Hold Imuran now and at discharge - Follow up with Hematology and Rheumatology and Pulmonology on discharge.   Lobar pneumonia ([place]) CT chest showed RIGHT basilar basilar dependent consolidation suspicious for pneumonia.   - Completed treatment with IV Rocephin - Continue aggressive pulmonary toilet   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 - Continue steroids - Hold Imuran - Pulmonology  has signed off -Palliative care consulted for goals of care, patient wants to follow-up with palliative care as outpatient    Medications     apixaban  2.5 mg Oral BID   Followed by   [START ON 12/24/2022] apixaban  5 mg Oral BID   vitamin C  500 mg Oral Daily   famotidine  20 mg Oral BID   finasteride  5 mg Oral Daily   fluticasone  2 spray Each Nare Daily   folic acid  [id number] mcg Oral Daily   insulin aspart  0-9 Units Subcutaneous Q4H   levothyroxine  25 mcg Oral Q0600   nirmatrelvir/ritonavir  3 tablet Oral BID   pantoprazole  40 mg Oral Daily   [START ON 12/21/2022] predniSONE  10 mg Oral Q breakfast   Followed by   Derrill Memo ON 12/22/2022] predniSONE  5 mg Oral Q breakfast   Followed by   Derrill Memo ON 12/23/2022] predniSONE  3 mg Oral Q breakfast   Treprostinil  48 mcg Inhalation 4 times per day   zinc sulfate  220 mg Oral Daily     Data Reviewed:   CBG:  Recent Labs  Lab 12/19/22 2309 12/20/22 0411 12/20/22 0604 12/20/22 0637 12/20/22 0653  GLUCAP 158* 106* 45* 64* 100*    SpO2: (!) 81 % O2 Flow Rate (L/min): 4 L/min FiO2 (%): 60 %    Vitals:   12/19/22 1924 12/20/22 0028 12/20/22 0338 12/20/22 0758  BP: 108/61 106/65 113/60 138/67  Pulse: 91 85 78 85  Resp: 18 16 18 16  $ Temp: 98.3 F (36.8 C) 98.6 F (37 C) 98.3 F (36.8 C) 98 F (36.7 C)  TempSrc: Oral Oral Oral   SpO2: 98% 96% 95% (!) 81%  Weight:       Height:          Data Reviewed:  Basic Metabolic Panel: Recent Labs  Lab 12/14/22 0104 12/17/22 0051 12/18/22 0101  NA 141 136 139  K 3.8 4.1 3.9  CL 100 94* 93*  CO2 31 29 33*  GLUCOSE 93 122* 127*  BUN 19 19 21  $ CREATININE 0.80 0.89 0.95  CALCIUM 7.9* 7.5* 7.9*    CBC: Recent Labs  Lab 12/14/22 0104 12/15/22 0959 12/17/22 0051  WBC 1.8* 1.6* 2.3*  NEUTROABS  --  1.0*  --   HGB 7.6* 9.1* 9.4*  HCT 24.3* 29.0* 29.6*  MCV 86.8 86.1 85.3  PLT 61* 54* 84*    LFT Recent Labs  Lab 12/14/22 0104 12/17/22 0051 12/18/22 0101  AST 13* 18 21  ALT 19 19 22  $ ALKPHOS 33* 27* 36*  BILITOT 0.3 0.9 1.0  PROT 3.9* 3.9* 4.5*  ALBUMIN 2.0* 1.9* 2.3*     Antibiotics: Anti-infectives (From admission, onward)    Start  Dose/Rate Route Frequency Ordered Stop   12/19/22 1000  nirmatrelvir/ritonavir (PAXLOVID) 3 tablet        3 tablet Oral 2 times daily 12/18/22 1355 12/24/22 0959   12/19/22 0000  nirmatrelvir/ritonavir (PAXLOVID) 20 x 150 MG & 10 x 100MG TABS        3 tablet Oral 2 times daily 12/19/22 1236 12/24/22 2359   12/13/22 1000  remdesivir 100 mg in sodium chloride 0.9 % 100 mL IVPB  Status:  Discontinued       Note to Pharmacy: Please message Dr. Brand Males with questions  See Hyperspace for full Linked Orders Report.   100 mg 200 mL/hr over 30 Minutes Intravenous Daily 12/12/22 1436 12/18/22 1355   12/12/22 1530  remdesivir 200 mg in sodium chloride 0.9% 250 mL IVPB       See Hyperspace for full Linked Orders Report.   200 mg 580 mL/hr over 30 Minutes Intravenous Once 12/12/22 1436 12/12/22 2158   12/11/22 1700  cefTRIAXone (ROCEPHIN) 2 g in sodium chloride 0.9 % 100 mL IVPB        2 g 200 mL/hr over 30 Minutes Intravenous Every 24 hours 12/11/22 1601 12/12/22 1859   12/08/22 1000  nirmatrelvir/ritonavir (PAXLOVID) 3 tablet  Status:  Discontinued        3 tablet Oral 2 times daily 12/08/22 0403 12/10/22 1646   12/08/22 0800  cefTRIAXone  (ROCEPHIN) 2 g in sodium chloride 0.9 % 100 mL IVPB  Status:  Discontinued        2 g 200 mL/hr over 30 Minutes Intravenous Daily 12/08/22 0611 12/10/22 1646        DVT prophylaxis: SCDs  Code Status: DNR  Family Communication: No family at bedside   CONSULTS PCCM   Objective    Physical Examination:  General-appears in no acute distress Heart-S1-S2, regular, no murmur auscultated Lungs-clear to auscultation bilaterally, no wheezing or crackles auscultated Abdomen-soft, nontender, no organomegaly Extremities-no edema in the lower extremities Neuro-alert, oriented x3, no focal deficit noted   Status is: Inpatient:      Pressure Injury 12/08/22 Sacrum Mid Stage 2 -  Partial thickness loss of dermis presenting as a shallow open injury with a red, pink wound bed without slough. (Active)  12/08/22 1905  Location: Sacrum  Location Orientation: Mid  Staging: Stage 2 -  Partial thickness loss of dermis presenting as a shallow open injury with a red, pink wound bed without slough.  Wound Description (Comments):   Present on Admission: Yes        [place]   [facility] If 7PM-7AM, please contact night-coverage at [email or web address], Office  [phone]   12/20/2022, 9:52 AM  LOS: 12 days

## 2022-12-20 NOTE — Progress Notes (Signed)
Date and time results received: 12/20/22 0604 (use smartphrase ".now" to insert current time)  Test: BS Critical Value: 45  Name of Provider Notified: Dr.Rathore  Orders Received? Or Actions Taken?: No New orders. Patient was given orange juice and snacks to eat. BS was rechecked and was 100.

## 2022-12-20 NOTE — Evaluation (Signed)
Occupational Therapy Evaluation Patient Details Name: [name] MRN: [id number] DOB: [birth year] Today's Date: 12/20/2022   History of Present Illness 77 year old male presented to ED 2/6 with c/o shortness of breath and SpO2 dropping to the 70s on his 4L O2 baseline. CT angiogram of chest which showed bilateral peripheral pulmonary emboli, with right heart strain. Admitted for treatment of COPD exacerbation with hypoxia. PMH: COPD, type 2 diabetes mellitus, emphysema, hypertension, RA, osteoarthritis and hypothyroidism, pHTN on Tyvaso and chronic respiratory failure on 4-5L at home, diagnosis of COVID in January   Clinical Impression   PTA, pt lived with his wife who assisted with bathing and IADL. Per chart, wife at has been at [facility] and plan to go to SNF. Upon eva, pt presents with decreased cardiorespiratory status, activity tolerance, safety, balance, and strength. Pt with limited additional support at home as daughter and sister unable to physically assist. Upon eval, pt requires supervision/set-up for UB ADL and min guard A for LB ADL. Obtaining orthostatic vitals and pt requiring up to min A to maintain standing with fatigue as well as drop in symptomatic drop in BP (see below). Pt with decreased safety awareness requiring cues during transfers and to initiate seated rest break with onset of dizziness. Recommending SNF (perhaps, could go same place as wife?) for continued OT services to optimize safety and independence in ADL and IADL.   Orthostatics:  128/62 (81) HR 98 supine  103/60 (73) HR 101 sitting  88/53 (61) HR 110 standing 0 mins  79/48 (58) HR 108 standing ~3 mins symptomatic with pt sitting while attempting to obtain BP  122/50 (70) sitting EOB 2 mins   SpO2 79-98 during session on 4L      Recommendations for follow up therapy are one component of a multi-disciplinary discharge planning process, led by the attending physician.  Recommendations may be updated based on  patient status, additional functional criteria and insurance authorization.   Follow Up Recommendations  Skilled nursing-short term rehab (<3 hours/day)     Assistance Recommended at Discharge Frequent or constant Supervision/Assistance  Patient can return home with the following A little help with walking and/or transfers;A little help with bathing/dressing/bathroom;Assistance with cooking/housework;Assist for transportation;Help with stairs or ramp for entrance    Functional Status Assessment  Patient has had a recent decline in their functional status and demonstrates the ability to make significant improvements in function in a reasonable and predictable amount of time.  Equipment Recommendations  Other (comment) (defer)    Recommendations for Other Services       Precautions / Restrictions Precautions Precautions: Fall Precaution Comments: watch SpO2; watch BP (orthostatic 2/18) Restrictions Weight Bearing Restrictions: No      Mobility Bed Mobility Overal bed mobility: Modified Independent             General bed mobility comments: Uses bed rail intermittently and increased time for bed mobility    Transfers Overall transfer level: Needs assistance Equipment used: Rolling walker (2 wheels), None Transfers: Sit to/from Stand Sit to Stand: Min guard     Step pivot transfers: Min guard, Min assist     General transfer comment: min guard A for safety. After standing for three minutes, seated rest break, and return to standing, up to min A to maintain upright position to get to chair      Balance Overall balance assessment: Needs assistance Sitting-balance support: Feet supported, No upper extremity supported Sitting balance-Leahy Scale: Good     Standing balance support:  Bilateral upper extremity supported, During functional activity, Reliant on assistive device for balance Standing balance-Leahy Scale: Poor Standing balance comment: requires UE support                            ADL either performed or assessed with clinical judgement   ADL Overall ADL's : Needs assistance/impaired Eating/Feeding: Modified independent;Sitting   Grooming: Sitting;Set up   Upper Body Bathing: Set up;Sitting   Lower Body Bathing: Min guard;Sit to/from stand   Upper Body Dressing : Set up;Sitting   Lower Body Dressing: Min guard;Sit to/from stand   Toilet Transfer: Min guard;Minimal assistance;Ambulation;Rolling walker (2 wheels);BSC/3in1 Toilet Transfer Details (indicate cue type and reason): up to min A with fatigue/drop in BP Toileting- Clothing Manipulation and Hygiene: Set up;Sitting/lateral lean       Functional mobility during ADLs: Min guard;Rolling walker (2 wheels)       Vision Ability to See in Adequate Light: 0 Adequate Patient Visual Report: No change from baseline Vision Assessment?: Vision impaired- to be further tested in functional context Additional Comments: undershooting when putting creamer and sweeteners into coffee. Able to read from menu     Perception     Praxis      Pertinent Vitals/Pain Pain Assessment Pain Assessment: No/denies pain     Hand Dominance Right   Extremity/Trunk Assessment Upper Extremity Assessment Upper Extremity Assessment: Generalized weakness   Lower Extremity Assessment Lower Extremity Assessment: Defer to PT evaluation   Cervical / Trunk Assessment Cervical / Trunk Assessment: Kyphotic   Communication Communication Communication: No difficulties   Cognition Arousal/Alertness: Awake/alert Behavior During Therapy: WFL for tasks assessed/performed Overall Cognitive Status: Impaired/Different from baseline Area of Impairment: Memory, Following commands, Safety/judgement, Problem solving                     Memory: Decreased short-term memory Following Commands: Follows one step commands consistently, Follows one step commands with increased  time Safety/Judgement: Decreased awareness of safety   Problem Solving: Slow processing, Requires verbal cues General Comments: potentially mild memory difficulty. Slowed processing. Oriented to month, year. Follows commands with increased time. cues for safety during transfers     General Comments  SpO2 in 90s on arrival and as low as 79 during standing to take orthostatic vitals. Recovering quickly with cues for pursed lip breathing. BP 128/82 (81) supine; 103/60 (73) HR 101 EOB asymptomatic; 88/53 (61) HR 110 standing 0 minutes asymptomatic; Attempted BP at 3 minutes, but pt becoming symptomatic prior to 3 mins with BP initiated in standing and finished seated BP 79/48 (58) HR 108. BP 122/50 (70), HR 52 seated EOB after 2 minutes seated.    Exercises     Shoulder Instructions      Home Living Family/patient expects to be discharged to:: Private residence Living Arrangements: Spouse/significant other Available Help at Discharge: Family;Available 24 hours/day Type of Home: Mobile home Home Access: Ramped entrance     Home Layout: One level     Bathroom Shower/Tub: Occupational psychologist: Standard     Home Equipment: Public relations account executive (2 wheels);Cane - quad          Prior Functioning/Environment Prior Level of Function : Needs assist             Mobility Comments: pt reports walking household distances with RW, no real community ambulation, ADLs Comments: able to dress, wifer assists with bathing, and does cooking and cleaning  OT Problem List: Decreased strength;Impaired balance (sitting and/or standing);Decreased activity tolerance;Decreased knowledge of use of DME or AE;Decreased knowledge of precautions;Decreased safety awareness;Cardiopulmonary status limiting activity      OT Treatment/Interventions: Self-care/ADL training;Therapeutic exercise;DME and/or AE instruction;Therapeutic activities;Patient/family education;Balance  training;Cognitive remediation/compensation    OT Goals(Current goals can be found in the care plan section) Acute Rehab OT Goals Patient Stated Goal: get better OT Goal Formulation: With patient Time For Goal Achievement: 01/03/23 Potential to Achieve Goals: Good  OT Frequency: Min 2X/week    Co-evaluation              AM-PAC OT "6 Clicks" Daily Activity     Outcome Measure Help from another person eating meals?: None Help from another person taking care of personal grooming?: A Little Help from another person toileting, which includes using toliet, bedpan, or urinal?: A Little Help from another person bathing (including washing, rinsing, drying)?: A Little Help from another person to put on and taking off regular upper body clothing?: A Little Help from another person to put on and taking off regular lower body clothing?: A Little 6 Click Score: 19   End of Session Equipment Utilized During Treatment: Gait belt;Rolling walker (2 wheels) Nurse Communication: Mobility status;Other (comment) (orthostatic, in recliner, RN agreeable pt can have a cup of coffee)  Activity Tolerance: Patient tolerated treatment well Patient left: in chair;with call bell/phone within reach;with chair alarm set  OT Visit Diagnosis: Unsteadiness on feet (R26.81);Muscle weakness (generalized) (M62.81);Other symptoms and signs involving cognitive function                Time: [id number] OT Time Calculation (min): 25 min Charges:  OT General Charges $OT Visit: 1 Visit OT Evaluation $OT Eval Moderate Complexity: 1 Mod OT Treatments $Self Care/Home Management : 8-22 mins  Elder Cyphers, OTR/L [facility] Acute Rehabilitation Office: [phone]   Magnus Ivan 12/20/2022, 8:48 AM

## 2022-12-21 LAB — CBC
HCT: 24.2 % — ABNORMAL LOW (ref 39.0–52.0)
Hemoglobin: 7.8 g/dL — ABNORMAL LOW (ref 13.0–17.0)
MCH: 27 pg (ref 26.0–34.0)
MCHC: 32.2 g/dL (ref 30.0–36.0)
MCV: 83.7 fL (ref 80.0–100.0)
Platelets: 76 K/uL — ABNORMAL LOW (ref 150–400)
RBC: 2.89 MIL/uL — ABNORMAL LOW (ref 4.22–5.81)
RDW: 22.2 % — ABNORMAL HIGH (ref 11.5–15.5)
WBC: 4.1 K/uL (ref 4.0–10.5)
nRBC: 0.5 % — ABNORMAL HIGH (ref 0.0–0.2)

## 2022-12-21 LAB — BASIC METABOLIC PANEL
Anion gap: 10 (ref 5–15)
BUN: 13 mg/dL (ref 8–23)
CO2: 35 mmol/L — ABNORMAL HIGH (ref 22–32)
Calcium: 7.5 mg/dL — ABNORMAL LOW (ref 8.9–10.3)
Chloride: 92 mmol/L — ABNORMAL LOW (ref 98–111)
Creatinine, Ser: 0.82 mg/dL (ref 0.61–1.24)
GFR, Estimated: >60 mL/min (ref >60)
Glucose, Bld: 236 mg/dL — ABNORMAL HIGH (ref 70–99)
Potassium: 3.7 mmol/L (ref 3.5–5.1)
Sodium: 137 mmol/L (ref 135–145)

## 2022-12-21 LAB — GLUCOSE, CAPILLARY
Glucose-Capillary: 110 mg/dL — ABNORMAL HIGH (ref 70–99)
Glucose-Capillary: 143 mg/dL — ABNORMAL HIGH (ref 70–99)
Glucose-Capillary: 150 mg/dL — ABNORMAL HIGH (ref 70–99)
Glucose-Capillary: 159 mg/dL — ABNORMAL HIGH (ref 70–99)
Glucose-Capillary: 223 mg/dL — ABNORMAL HIGH (ref 70–99)
Glucose-Capillary: 81 mg/dL (ref 70–99)

## 2022-12-21 MED ORDER — APIXABAN 2.5 MG PO TABS: 2.5000 mg | ORAL_TABLET | Freq: Two times a day (BID) | ORAL | 0 refills | Status: DC

## 2022-12-21 MED ORDER — NIRMATRELVIR/RITONAVIR (PAXLOVID)TABLET: 3.0000 | ORAL_TABLET | Freq: Two times a day (BID) | ORAL | 0 refills | Status: DC

## 2022-12-21 NOTE — Progress Notes (Signed)
[facility]  PROGRESS NOTE  [name] [id number] DOB: 02/24/1946 DOA: 12/07/2022 PCP: Merrilee Seashore, MD   Brief HPI:   77 year old male with history of rheumatoid arthritis, ILD on Actemra and Imuran, emphysema, pulmonary hypertension on [place], chronic respiratory failure on 4 to 5 L oxygen at home, recent COVID infection complicated by pancytopenia presented with dyspnea and respiratory distress.  1/5-1/12: Admitted to [facility] for COVID infection, ABG showed pO2 36 on home 4L admitted to ICU on [place] and BiPAP; improved on steroids and discharged on Augmentin   1/21-1/29: Readmitted to [facility] with SOB/fever, this time found to have pancytopenia platelets 50K; treated with cefepime x9 days and discharged again home with [facility] on prednisone     2/5: Readmitted for respiratory distress; CTA chest showed bilateral inferior basilar posterior segmental pulmonary emboli 2/6: CCM consulted, recommended heparin, steroids 2/7: Echo shows stable cor pulmonale and pHTN 2/8: DVT study shows L gastroc DVT 2/9: Switched to apixaban, weaned to 6L at rest, but still 10L with ambulation 2/10: Still on 12L O2 with ambulation, Pulmonary consulted 12/14/2022: Patient is requiring 5L O2 by [place]. 12/15/2022: The patient's oxygen requirements have increased to 8L. Pulmonary has signed off.  2/17 patient was supposed to go home with home health PT however had syncopal episode in the hospital.  Discharge was held 2/19 patient to go to skilled nursing facility; follow-up pulmonology as outpatient    Subjective   Patient seen and examined   Assessment/Plan:     Acute respiratory failure with hypoxia ([place]) -Significantly improved, back to baseline - Secondary submassive PE, also has underlying ILD - Also started on remdesivir for COVID-19 infection -PCCM was consulted, initially treated with remdesivir, now on Paxlovid -Also recommend palliative care for goals of care  discussion -Diuresed with with IV Lasix in the hospital -He was started on Lasix 20 mg daily which was held due to syncope  Syncope - discharge was held on 2/17 as patient had syncopal episode while walking with physical therapy.  Blood pressure was low.  Lasix was held.  TED hose ordered. -Positive orthostatic hypotension -Resolved; blood pressure has improved  COPD with acute exacerbation (HCC) - Continue steroids. - Continue bronchodilators.     COVID-19 virus infection Tested positive in Nov 06, 2022.  Given he is on Rituxan, have to consider ongoing COVID infection. - Continue remdesivir for total 14 days, last day 12/26/22 - Airborne precautions -  COVID IGg Abs was nonreactive due to Rituxan -Discussed with ID Dr. Karolee Ohs and pulmonology Dr. Etta Quill; will discontinue remdesivir today.  Patient will be be discharged home on Paxlovid for 5 more days. -Discharge canceled, patient to go to skilled nursing facility for rehab   Dyslipidemia - Hold lipitor   Essential hypertension - Hold amlodipine given soft BP   Hypomagnesemia Supplement and monitor.   Hypokalemia Replete   Elevated troponin I level Likely myocardial injury due to COVID, ischemia ruled out   Type 2 diabetes mellitus (HCC) Glucose controlled - Hold metformin and NPH - Continue SS corrections     Acute pulmonary embolism (HCC) - Continue apixaban - Consulted IR for thrombectomy without tPA - The patient is not a candidate for catheter directed thrombectomy.   Thrombocytopenia ([place]) See below   Rheumatoid arthritis ([place]) - Hold Actemra, Imuran - Continue steroids   Pressure injury of skin  Sacrum, stage II, present on admission. Wound care consulted.   Pulmonary hypertension ([place]) Echo shows severe pulmonary hypertension and TR, estimated  pressures may be higher than last echo.  Got Lasix 2/9 - Consult Pulmonology appreciate cares. - Continue Treprostinil.     Hypothyroidism -  Continue levothyroxine   Pancytopenia ([place]) This first was noted mid Jan during admission at [facility], again WBC <4, Hgb <8, and platelets <50.  Suspect bone marrow suppression from COVID, in setting of Imuran   B12, folate, smear unremarkable.  Retics hypoproliferative. - Hold Imuran now and at discharge - Follow up with Hematology and Rheumatology and Pulmonology on discharge.   Lobar pneumonia ([place]) CT chest showed RIGHT basilar basilar dependent consolidation suspicious for pneumonia.   - Completed treatment with IV Rocephin - Continue aggressive pulmonary toilet   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 - Continue steroids - Hold Imuran - Pulmonology  has signed off -Palliative care consulted for goals of care, patient wants to follow-up with palliative care as outpatient    Medications     apixaban  2.5 mg Oral BID   Followed by   [START ON 12/24/2022] apixaban  5 mg Oral BID   vitamin C  500 mg Oral Daily   famotidine  20 mg Oral BID   finasteride  5 mg Oral Daily   fluticasone  2 spray Each Nare Daily   folic acid  [id number] mcg Oral Daily   insulin aspart  0-9 Units Subcutaneous Q4H   levothyroxine  25 mcg Oral Q0600   nirmatrelvir/ritonavir  3 tablet Oral BID   pantoprazole  40 mg Oral Daily   [START ON 12/22/2022] predniSONE  5 mg Oral Q breakfast   Followed by   Derrill Memo ON 12/23/2022] predniSONE  3 mg Oral Q breakfast   Treprostinil  48 mcg Inhalation 4 times per day   zinc sulfate  220 mg Oral Daily     Data Reviewed:   CBG:  Recent Labs  Lab 12/20/22 2002 12/20/22 2353 12/21/22 0033 12/21/22 0439 12/21/22 0742  GLUCAP 187* 67* 143* 110* 81    SpO2: (!) 86 % O2 Flow Rate (L/min): 4.5 L/min FiO2 (%): 60 %    Vitals:   12/20/22 1956 12/21/22 0442 12/21/22 0723 12/21/22 0802  BP: (!) 95/40 126/60 98/61 (!) 101/46  Pulse: 79 76 (!) 55 86  Resp: 16 18 18   $ Temp: 98.5 F (36.9 C) 97.6 F (36.4 C) (!) 97.4 F (36.3 C)   TempSrc: Oral Oral Oral    SpO2: 100% 96% (!) 86%   Weight:      Height:          Data Reviewed:  Basic Metabolic Panel: Recent Labs  Lab 12/17/22 0051 12/18/22 0101  NA 136 139  K 4.1 3.9  CL 94* 93*  CO2 29 33*  GLUCOSE 122* 127*  BUN 19 21  CREATININE 0.89 0.95  CALCIUM 7.5* 7.9*    CBC: Recent Labs  Lab 12/15/22 0959 12/17/22 0051  WBC 1.6* 2.3*  NEUTROABS 1.0*  --   HGB 9.1* 9.4*  HCT 29.0* 29.6*  MCV 86.1 85.3  PLT 54* 84*    LFT Recent Labs  Lab 12/17/22 0051 12/18/22 0101  AST 18 21  ALT 19 22  ALKPHOS 27* 36*  BILITOT 0.9 1.0  PROT 3.9* 4.5*  ALBUMIN 1.9* 2.3*     Antibiotics: Anti-infectives (From admission, onward)    Start     Dose/Rate Route Frequency Ordered Stop   12/19/22 1000  nirmatrelvir/ritonavir (PAXLOVID) 3 tablet        3 tablet Oral 2  times daily 12/18/22 1355 12/24/22 0959   12/19/22 0000  nirmatrelvir/ritonavir (PAXLOVID) 20 x 150 MG & 10 x 100MG TABS        3 tablet Oral 2 times daily 12/19/22 1236 12/24/22 2359   12/13/22 1000  remdesivir 100 mg in sodium chloride 0.9 % 100 mL IVPB  Status:  Discontinued       Note to Pharmacy: Please message Dr. Brand Males with questions  See Hyperspace for full Linked Orders Report.   100 mg 200 mL/hr over 30 Minutes Intravenous Daily 12/12/22 1436 12/18/22 1355   12/12/22 1530  remdesivir 200 mg in sodium chloride 0.9% 250 mL IVPB       See Hyperspace for full Linked Orders Report.   200 mg 580 mL/hr over 30 Minutes Intravenous Once 12/12/22 1436 12/12/22 2158   12/11/22 1700  cefTRIAXone (ROCEPHIN) 2 g in sodium chloride 0.9 % 100 mL IVPB        2 g 200 mL/hr over 30 Minutes Intravenous Every 24 hours 12/11/22 1601 12/12/22 1859   12/08/22 1000  nirmatrelvir/ritonavir (PAXLOVID) 3 tablet  Status:  Discontinued        3 tablet Oral 2 times daily 12/08/22 0403 12/10/22 1646   12/08/22 0800  cefTRIAXone (ROCEPHIN) 2 g in sodium chloride 0.9 % 100 mL IVPB  Status:  Discontinued        2 g 200 mL/hr  over 30 Minutes Intravenous Daily 12/08/22 0611 12/10/22 1646        DVT prophylaxis: SCDs  Code Status: DNR  Family Communication: No family at bedside   CONSULTS PCCM   Objective    Physical Examination:  General-appears in no acute distress Heart-S1-S2, regular, no murmur auscultated Lungs-clear to auscultation bilaterally, no wheezing or crackles auscultated Abdomen-soft, nontender, no organomegaly Extremities-no edema in the lower extremities Neuro-alert, oriented x3, no focal deficit noted  Status is: Inpatient:      Pressure Injury 12/08/22 Sacrum Mid Stage 2 -  Partial thickness loss of dermis presenting as a shallow open injury with a red, pink wound bed without slough. (Active)  12/08/22 1905  Location: Sacrum  Location Orientation: Mid  Staging: Stage 2 -  Partial thickness loss of dermis presenting as a shallow open injury with a red, pink wound bed without slough.  Wound Description (Comments):   Present on Admission: Yes        [place]   [facility] If 7PM-7AM, please contact night-coverage at [email or web address], Office  [phone]   12/21/2022, 8:12 AM  LOS: 13 days

## 2022-12-21 NOTE — Consult Note (Signed)
   [facility] CM Inpatient Consult   12/21/2022  [name] 10-17-1946 [id number]  [place] [facility] [ACO] Patient:  [name]  Primary Care Provider:  Merrilee Seashore, MD, [facility]   Patient was reviewed for length of stay and barriers to care transition. Patient is for a skilled nursing facility level of care for transition.  If the patient goes to a [facility] affiliated facility then, patient can be followed by [place] [facility] PAC RN with traditional [name] and approved [name] Advantage plans. Currently, the plan is for [facility].  Met with patient and family at the bedside and explained [place] Coordination services available for home transition, if applicable. Patient was given a follow up appointment card reminder and a 24 hour nurse advise line magnet.  Plan:   If patient transitions to affiliated [facility] facility, then will notify [facility] RN who can follow for any known or needs for transitional care needs for returning to post facility care coordination needs to return to community.  For questions or referrals, please contact:   Natividad Brood, RN BSN [place]  [phone] business mobile phone Toll free office [phone]  *[place]  [phone] Fax number: [phone] [email or web address]

## 2022-12-21 NOTE — NC FL2 (Signed)
[place] LEVEL OF CARE FORM     IDENTIFICATION  Patient Name: [name] Birthdate: 1945-12-16 Sex: male Admission Date (Current Location): 12/07/2022  [facility] and [place] Number:  Herbalist and Address:  [facility], [place] [address], [place], [place] [address]      Provider Number: [id number]  Attending Physician Name and Address:  Oswald Hillock, MD  Relative Name and Phone Number:       Current Level of Care: Hospital Recommended Level of Care: [place] Prior Approval Number:    Date Approved/Denied:   PASRR Number: [id number]  Discharge Plan: SNF    Current Diagnoses: Patient Active Problem List   Diagnosis Date Noted   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 12/12/2022   Lobar pneumonia ([place]) 12/11/2022   Pancytopenia ([place]) 12/10/2022   Pressure injury of skin 12/09/2022   Pulmonary hypertension ([place]) 12/09/2022   Thrombocytopenia ([place]) 12/09/2022   COPD with acute exacerbation ([place]) 12/08/2022   COVID-19 virus infection 12/08/2022   Dyslipidemia 12/08/2022   Essential hypertension 12/08/2022   Hypomagnesemia 12/08/2022   Hypokalemia 12/08/2022   Elevated troponin I level 12/08/2022   Type 2 diabetes mellitus ([place]) 12/08/2022   Acute pulmonary embolism ([place]) 12/08/2022   Acute and chronic respiratory failure with hypoxia ([place]) 12/08/2022   Weight loss 05/13/2022   Dental caries 05/13/2022   Respiratory distress 11/14/2020   Acute on chronic respiratory failure ([place]) 02/17/2020   Aortic atherosclerosis ([place]) 08/01/2019   Shortness of breath 08/01/2019   Therapeutic drug monitoring 05/30/2019   Chronic respiratory failure with hypoxia (HCC) 05/30/2019   Nocturnal hypoxemia 04/26/2019   Medication management 04/26/2019   Drug reaction 10/18/2018   Vomiting without nausea 10/18/2018   Wheezing on auscultation 01/01/2017   Nodule of right lung 07/29/2015   Coronary artery calcification  02/11/2014   Caries 01/18/2014   Chronic cough 07/01/2013   Obesity (BMI 30.0-34.9) 07/26/2012   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) 12/15/2011   Pneumonia 12/15/2011   HTN (hypertension) 12/15/2011   Rheumatoid arthritis ([place]) 12/15/2011   Hypothyroidism 12/15/2011    Orientation RESPIRATION BLADDER Height & Weight     Self, Time, Situation, Place  O2 Continent Weight: 132 lb 7.9 oz (60.1 kg) Height:  5' 6"$  (167.6 cm)  BEHAVIORAL SYMPTOMS/MOOD NEUROLOGICAL BOWEL NUTRITION STATUS      Continent Diet (see d/c summary)  AMBULATORY STATUS COMMUNICATION OF NEEDS Skin   Limited Assist Verbally Normal                       Personal Care Assistance Level of Assistance  Bathing, Feeding, Dressing Bathing Assistance: Limited assistance Feeding assistance: Independent Dressing Assistance: Independent     Functional Limitations Info  Sight, Hearing, Speech Sight Info: Adequate Hearing Info: Adequate Speech Info: Adequate    SPECIAL CARE FACTORS FREQUENCY  PT (By licensed PT), OT (By licensed OT)     PT Frequency: 5x/wk OT Frequency: 5x/wk            Contractures Contractures Info: Not present    Additional Factors Info  Code Status Code Status Info: DNR             Current Medications (12/21/2022):  This is the current hospital active medication list Current Facility-Administered Medications  Medication Dose Route Frequency Provider Last Rate Last Admin   acetaminophen (TYLENOL) tablet 1,000 mg  1,000 mg Oral Q6H PRN Barb Merino,  MD   1,000 mg at 12/15/22 0846   apixaban (ELIQUIS) tablet 2.5 mg  2.5 mg Oral BID Carlyle Basques, MD   2.5 mg at 12/21/22 [id number]   Followed by   Derrill Memo ON 12/24/2022] apixaban (ELIQUIS) tablet 5 mg  5 mg Oral BID Carlyle Basques, MD       ascorbic acid (VITAMIN C) tablet 500 mg  500 mg Oral Daily Mansy, Jan A, MD   500 mg at 12/21/22 [id number]   chlorpheniramine-HYDROcodone (TUSSIONEX) 10-8 MG/5ML suspension 5 mL  5 mL  Oral Q12H PRN Mansy, Jan A, MD       dextrose 50 % solution 12.5 g  12.5 g Intravenous PRN Shela Leff, MD   12.5 g at 12/21/22 0004   famotidine (PEPCID) tablet 20 mg  20 mg Oral BID Mansy, Jan A, MD   20 mg at 12/21/22 [id number]   finasteride (PROSCAR) tablet 5 mg  5 mg Oral Daily Barb Merino, MD   5 mg at 12/21/22 0808   fluticasone (FLONASE) 50 MCG/ACT nasal spray 2 spray  2 spray Each Nare Daily Barb Merino, MD   2 spray at [id number]   folic acid (FOLVITE) tablet 0.5 mg  500 mcg Oral Daily Barb Merino, MD   0.5 mg at 12/21/22 0808   guaiFENesin-dextromethorphan (ROBITUSSIN DM) 100-10 MG/5ML syrup 10 mL  10 mL Oral Q4H PRN Mansy, Jan A, MD       insulin aspart (novoLOG) injection 0-9 Units  0-9 Units Subcutaneous Q4H Shela Leff, MD   2 Units at 12/20/22 2009   Ipratropium-Albuterol (COMBIVENT) respimat 1 puff  1 puff Inhalation Q6H PRN Edwin Dada, MD       levothyroxine (SYNTHROID) tablet 25 mcg  25 mcg Oral Q0600 Barb Merino, MD   25 mcg at 12/21/22 [id number]   magnesium hydroxide (MILK OF MAGNESIA) suspension 30 mL  30 mL Oral Daily PRN Mansy, Arvella Merles, MD       nirmatrelvir/ritonavir (PAXLOVID) 3 tablet  3 tablet Oral BID Carlyle Basques, MD   3 tablet at 12/21/22 0809   ondansetron (ZOFRAN) tablet 4 mg  4 mg Oral Q6H PRN Mansy, Jan A, MD   4 mg at 12/20/22 0900   Or   ondansetron (ZOFRAN) injection 4 mg  4 mg Intravenous Q6H PRN Mansy, Jan A, MD       pantoprazole (PROTONIX) EC tablet 40 mg  40 mg Oral Daily Barb Merino, MD   40 mg at 12/21/22 0809   [START ON 12/22/2022] predniSONE (DELTASONE) tablet 5 mg  5 mg Oral Q breakfast Oswald Hillock, MD       Followed by   Derrill Memo ON 12/23/2022] predniSONE (DELTASONE) tablet 3 mg  3 mg Oral Q breakfast [place], Gagan S, MD       traZODone (DESYREL) tablet 25 mg  25 mg Oral QHS PRN Mansy, Jan A, MD   25 mg at 12/14/22 2125   Treprostinil POWD 48 mcg  48 mcg Inhalation 4 times per day Barb Merino, MD   48 mcg at  12/21/22 0815   zinc sulfate capsule 220 mg  220 mg Oral Daily Mansy, Jan A, MD   220 mg at 12/21/22 0809     Discharge Medications: Please see discharge summary for a list of discharge medications.  Relevant Imaging Results:  Relevant Lab Results:   Additional Information SS#: [id number]  Jinger Neighbors, LCSW

## 2022-12-21 NOTE — Inpatient Diabetes Management (Addendum)
Inpatient Diabetes Program Recommendations  AACE/ADA: New Consensus Statement on Inpatient Glycemic Control (2015)  Target Ranges:  Prepandial:   less than 140 mg/dL      Peak postprandial:   less than 180 mg/dL (1-2 hours)      Critically ill patients:  140 - 180 mg/dL   Lab Results  Component Value Date   GLUCAP 223 (H) 12/21/2022   HGBA1C 6.6 (H) 12/19/2022    Review of Glycemic Control  Latest Reference Range & Units 12/20/22 17:02 12/20/22 20:02 12/20/22 23:53 12/21/22 00:33 12/21/22 04:39 12/21/22 07:42 12/21/22 11:35  Glucose-Capillary 70 - 99 mg/dL 117 (H) 187 (H) 67 (L) 143 (H) 110 (H) 81 223 (H)  (H): Data is abnormally high (L): Data is abnormally low  Diabetes history: DM2 Outpatient Diabetes medications: Novolin N 2-3 PRN, Metformin 500 mg BID Current orders for Inpatient glycemic control: Novolog 0-9 units Q4H, Prednisone taper  Inpatient Diabetes Program Recommendations:    If appropriate, please consider:  Novolog 0-9 units TID and 0-5 units QHS  Novolog 2 units TID with meals if he consumes at least 50%  Will continue to follow while inpatient.  Thank you, Reche Dixon, MSN, [place] Diabetes Coordinator Inpatient Diabetes Program [phone] (team pager from 8a-5p)

## 2022-12-21 NOTE — TOC Transition Note (Signed)
Transition of Care [facility]) - CM/SW Discharge Note   Patient Details  Name: [name] MRN: [id number] Date of Birth: 1946/05/27  [facility] CM/SW Contact:  Jinger Neighbors, LCSW Phone Number: 12/21/2022, 3:47 PM   Clinical Narrative:     Pt discharging to [facility] via [facility].   Call to report: [phone] Room #: 510-2  Final next level of care: Skilled Nursing Facility Barriers to Discharge: No Barriers Identified   Patient Goals and CMS Choice CMS Medicare.gov Compare Post Acute Care list provided to:: Patient Choice offered to / list presented to : Patient  Discharge Placement                Patient [facility] at: [facility] Patient to be transferred to facility by: [place] Name of family member notified: Self-PT Patient and family notified of of transfer: 12/21/22  Discharge Plan and Services Additional resources added to the After Visit Summary for   In-house Referral: NA Discharge Planning Services: CM Consult Post Acute Care Choice: Home Health          DME Arranged: N/A DME Agency: NA       HH Arranged: PT HH Agency: [place] Date [place]: 12/19/22 Time [place]: [id number] Representative spoke with at [place]: [place] Determinants of Health ([place]) Interventions SDOH Screenings   Food Insecurity: No Food Insecurity (06/01/2019)  Housing: Low Risk  (06/01/2019)  Depression (PHQ2-9): Low Risk  (06/01/2019)  Financial Resource Strain: Low Risk  (06/01/2019)  Physical Activity: Inactive (06/01/2019)  Stress: No Stress Concern Present (06/01/2019)  Tobacco Use: Medium Risk (10/23/2022)     Readmission Risk Interventions    12/08/2022    3:54 PM  Readmission Risk Prevention Plan  Transportation Screening Complete  PCP or Specialist Appt within 3-5 Days Complete  HRI or [place] Complete  Social Work Consult for [place] Planning/Counseling Complete  Palliative Care  Screening Not Applicable  Medication Review Press photographer) Complete

## 2022-12-21 NOTE — TOC Initial Note (Addendum)
Transition of Care [facility]) - Initial/Assessment Note    Patient Details  Name: [name] MRN: [id number] Date of Birth: 08/09/1946  Transition of Care [facility]) CM/SW Contact:    Jinger Neighbors, LCSW Phone Number: 12/21/2022, 11:14 AM  Clinical Narrative:                 CSW met with pt at bedside to complete assessment. Pt AAOx4; engaged well and appropriate responses throughout assessment. CSW reviewed recommendations for SNF; pt agreed. He called his wife and placed her on speaker phone. She stated if they can not go to the same SNF, they will go home. CSW will continue to collaborate with her RNCM and complete. Work up.   Work up complete; pt and his wife have both been accepted to [facility]. CSW reached out to [facility] to request auth.   Expected Discharge Plan: Skilled Nursing Facility Barriers to Discharge: Continued Medical Work up   Patient Goals and CMS Choice Patient states their goals for this hospitalization and ongoing recovery are:: return home CMS Medicare.gov Compare Post Acute Care list provided to:: Patient Choice offered to / list presented to : Patient      Expected Discharge Plan and Services In-house Referral: NA Discharge Planning Services: CM Consult Post Acute Care Choice: [place] arrangements for the past 2 months: Mobile Home Expected Discharge Date: 12/19/22               DME Arranged: N/A DME Agency: NA       HH Arranged: PT HH Agency: [place] Date [place]: 12/19/22 Time HH Agency Contacted: 57 Representative spoke with at [place]: Alwyn Ren  Prior Living Arrangements/Services Living arrangements for the past 2 months: Mobile Home Lives with:: Adult Children, Spouse Patient language and need for interpreter reviewed:: Yes Do you feel safe going back to the place where you live?: Yes      Need for Family Participation in Patient Care: Yes (Comment) Care giver support system in place?: Yes (comment) Current  home services: DME (walker, cane, home oxygen with [employer] 5 liters) Criminal Activity/Legal Involvement Pertinent to Current Situation/Hospitalization: No - Comment as needed  Activities of Daily Living      Permission Sought/Granted                  Emotional Assessment Appearance:: Appears stated age Attitude/Demeanor/Rapport: Engaged Affect (typically observed): Accepting, Adaptable Orientation: : Oriented to Self, Oriented to Place, Oriented to  Time, Oriented to Situation Alcohol / Substance Use: Not Applicable Psych Involvement: No (comment)  Admission diagnosis:  Hypokalemia [E87.6] Thrombocytopenia (HCC) [D69.6] COPD exacerbation (HCC) [J44.1] Acute respiratory failure with hypoxia (HCC) [J96.01] Acute and chronic respiratory failure with hypoxia (HCC) [J96.21] Leukopenia, unspecified type [D72.819] COVID-19 virus infection [U07.1] Patient Active Problem List   Diagnosis Date Noted   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 12/12/2022   Lobar pneumonia ([place]) 12/11/2022   Pancytopenia ([place]) 12/10/2022   Pressure injury of skin 12/09/2022   Pulmonary hypertension ([place]) 12/09/2022   Thrombocytopenia ([place]) 12/09/2022   COPD with acute exacerbation ([place]) 12/08/2022   COVID-19 virus infection 12/08/2022   Dyslipidemia 12/08/2022   Essential hypertension 12/08/2022   Hypomagnesemia 12/08/2022   Hypokalemia 12/08/2022   Elevated troponin I level 12/08/2022   Type 2 diabetes mellitus ([place]) 12/08/2022   Acute pulmonary embolism ([place]) 12/08/2022   Acute and chronic respiratory failure with hypoxia ([place]) 12/08/2022   Weight loss 05/13/2022   Dental caries 05/13/2022  Respiratory distress 11/14/2020   Acute on chronic respiratory failure ([place]) 02/17/2020   Aortic atherosclerosis ([place]) 08/01/2019   Shortness of breath 08/01/2019   Therapeutic drug monitoring 05/30/2019   Chronic respiratory failure with hypoxia (HCC) 05/30/2019   Nocturnal hypoxemia 04/26/2019    Medication management 04/26/2019   Drug reaction 10/18/2018   Vomiting without nausea 10/18/2018   Wheezing on auscultation 01/01/2017   Nodule of right lung 07/29/2015   Coronary artery calcification 02/11/2014   Caries 01/18/2014   Chronic cough 07/01/2013   Obesity (BMI 30.0-34.9) 07/26/2012   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, [place] [address] [place] [address] Phone: [phone] Fax: [place] [address] Phone: [phone] Fax: [phone]  [place], [place] [address] [place] [address] Phone: [phone] Fax: [phone]     Social Determinants of Health (SDOH) Social History: SDOH Screenings   Food Insecurity: No Food Insecurity (06/01/2019)  Housing: Low Risk  (06/01/2019)  Depression (PHQ2-9): Low Risk  (06/01/2019)  Financial Resource Strain: Low Risk  (06/01/2019)  Physical Activity: Inactive (06/01/2019)  Stress: No Stress Concern Present (06/01/2019)  Tobacco Use: Medium Risk (10/23/2022)   SDOH Interventions:     Readmission Risk Interventions    12/08/2022    3:54 PM  Readmission Risk Prevention Plan  Transportation Screening Complete  PCP or Specialist Appt within 3-5 Days Complete  HRI or [place] Complete  Social Work Consult for [place] Planning/Counseling Complete  Palliative Care Screening Not Applicable  Medication Review Press photographer) Complete

## 2022-12-21 NOTE — Progress Notes (Signed)
Patient to be discharged tomorrow as per [facility] facility due to discharge summary. Dr. Darrick Meigs made aware. Charge nurse made aware

## 2022-12-21 NOTE — Discharge Summary (Addendum)
Physician Discharge Summary   Patient: [name] MRN: [id number] DOB: 06-03-1946  Admit date:     12/07/2022  Discharge date: 12/21/22  Discharge Physician: Oswald Hillock   PCP: Merrilee Seashore, MD   Recommendations at discharge:   Follow-up pulmonology as outpatient Because of immunosuppressed status, patient was treated with Paxlovid and remdesivir for 7 days.  ID was contacted and they recommended 5 more days of treatment with Paxlovid.  Last day of Paxlovid 12/23/2022.  Patient will need 3 more days of Paxlovid at the facility. Repeat COVID-19 test on 12/18/2022 was negative  Discharge Diagnoses: Principal Problem:   Acute and chronic respiratory failure with hypoxia ([place]) Active Problems:   COVID-19 virus infection   Pulmonary hypertension (HCC)   COPD with acute exacerbation (HCC)   Pancytopenia (HCC)   Lobar pneumonia (HCC)   Thrombocytopenia (HCC)   Rheumatoid arthritis (HCC)   Hypothyroidism   Dyslipidemia   Essential hypertension   Hypomagnesemia   Hypokalemia   Elevated troponin I level   Type 2 diabetes mellitus (HCC)   Pressure injury of skin   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  Resolved Problems:   * No resolved hospital problems. [facility] Course:  [name] is a 77 y.o. M with RA, ILD on Actemra and Imuran, emphysema and pHTN on Tyvaso and chronic respiratory failure on 4-5L at home, as well as recent COVID complicated by pancytopenia who presented with dyspnea and respiratory distress.      1/5-1/12: Admitted to [facility] for COVID, ABG showed pO2 36 on home 4L admitted to ICU on [place] and BiPAP; improved on steroids and discharged on Augmentin  1/21-1/29: Readmitted to [facility] with SOB/fever, this time found to have pancytopenia platelets 50K; treated with cefepime x9 days and discharged again home with [facility] on prednisone   2/5: Readmitted for respiratory distress 2/6: CCM consulted, recommended heparin, steroids 2/7: Echo shows stable cor  pulmonale and pHTN 2/8: DVT study shows L gastroc DVT 2/9: Switched to apixaban, weaned to 6L at rest, but still 10L with ambulation 2/10: Still on 12L O2 with ambulation, Pulmonary consulted 12/14/2022: Patient is requiring 5L O2 by [place]. 12/15/2022: The patient is currently requiring 8L. Pulmonary has signed off.  2/17 patient was supposed to go home with home health PT however had syncopal episode in the hospital.  Discharge was held 2/19 patient to go to skilled nursing facility; follow-up pulmonology as outpatient Assessment and Plan:    Acute respiratory failure with hypoxia ([place]) - Secondary submassive PE, also has underlying ILD - Also started on remdesivir for COVID-19 infection -PCCM was consulted, recommend to continue current treatment -Also recommend palliative care for goals of care discussion -Diuresed with with IV Lasix given yesterday -Also received p.o. Lasix with good diuretic response -At this time requiring 4 to 5 L of oxygen via nasal cannula which is patient's baseline   COPD with acute exacerbation (HCC) - Continue steroids. - Continue bronchodilators.     COVID-19 virus infection Tested positive in Nov 06, 2022.  Given he is on Rituxan, have to consider ongoing COVID infection. - Continue remdesivir for total 14 days, last day 12/26/22 - Airborne precautions -  COVID IGg Abs was nonreactive due to Rituxan -Discussed with ID Dr. Karolee Ohs and pulmonology Dr. Etta Quill; will discontinue remdesivir today.  Patient be discharged home on Paxlovid for 5 more days starting from 12/19/2022, last day 12/23/22. -Repeat SARS-CoV-2 RT-PCR was negative   Dyslipidemia - Continue Lipitor  Essential hypertension - Hold amlodipine given soft BP   Hypomagnesemia Supplement and monitor.   Hypokalemia Replete   Elevated troponin I level Likely myocardial injury due to COVID, ischemia ruled out   Type 2 diabetes mellitus (HCC) Glucose controlled - Continue home  regimen     Acute pulmonary embolism (HCC) - Continue apixaban - Consulted IR for thrombectomy without tPA - The patient is not a candidate for catheter directed thrombectomy.   Thrombocytopenia ([place]) See below   Rheumatoid arthritis ([place]) - Hold Actemra, Imuran - Continue steroids -Follow-up rheumatology as outpatient   Pressure injury of skin  Sacrum, stage II, present on admission. Wound care consulted.   Pulmonary hypertension ([place]) Echo shows severe pulmonary hypertension and TR, estimated pressures may be higher than last echo.  Got Lasix 2/9 - Consulted Pulmonology  -Follow-up pulmonology as outpatient     Hypothyroidism - Continue levothyroxine   Pancytopenia ([place]) This first was noted mid Jan during admission at [facility] Here, again WBC <4, Hgb <8, and platelets <50.  Suspect bone marrow suppression from COVID, in setting of Imuran   B12, folate, smear unremarkable.  Retics hypoproliferative. - Hold Imuran now and at discharge - Follow up with Hematology and Rheumatology and Pulmonology on discharge.   Lobar pneumonia ([place]) CT chest showed RIGHT basilar basilar dependent consolidation suspicious for pneumonia.   - Completed treatment with IV Rocephin - Continue aggressive pulmonary toilet   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 - Continue steroids - Hold Imuran - Pulmonology  has signed off -Palliative care consulted for goals of care, patient wants to follow-up with palliative care as outpatient         Consultants: Pulmonology Procedures performed: Echocardiogram Disposition: Home Diet recommendation:  Discharge Diet Orders (From admission, onward)     Start     Ordered   12/19/22 0000  Diet - low sodium heart healthy        12/19/22 1236           Regular diet DISCHARGE MEDICATION: Allergies as of 12/21/2022       Reactions   Bactrim [sulfamethoxazole-trimethoprim] Shortness Of Breath   Patient states "got short winded"   Spiriva  Respimat [tiotropium Bromide Monohydrate] Rash   Ofev [nintedanib] Other (See Comments)   Unknown         Medication List     STOP taking these medications    Actemra ACTPen 162 MG/0.9ML [place] Generic drug: Tocilizumab   amLODipine 10 MG tablet Commonly known as: NORVASC   aspirin 81 MG tablet   azaTHIOprine 50 MG tablet Commonly known as: IMURAN       TAKE these medications    acetaminophen 500 MG tablet Commonly known as: TYLENOL Take 1,000 mg by mouth 2 (two) times daily as needed for moderate pain or headache.   albuterol 108 (90 Base) MCG/ACT inhaler Commonly known as: VENTOLIN HFA Inhale 4-5 puffs into the lungs as needed for wheezing or shortness of breath.   alfuzosin 10 MG 24 hr tablet Commonly known as: UROXATRAL Take 1 tablet (10 mg total) by mouth daily with breakfast.   apixaban 2.5 MG Tabs tablet Commonly known as: ELIQUIS Take 1 tablet (2.5 mg total) by mouth 2 (two) times daily for 3 days.   apixaban 5 MG Tabs tablet Commonly known as: ELIQUIS Take 1 tablet (5 mg total) by mouth 2 (two) times daily. Start taking this medication from 12/24/2022 after you have completed Eliquis 2.5 mg twice a day for  5 days. Start taking on: December 24, 2022   atorvastatin 20 MG tablet Commonly known as: LIPITOR Take 20 mg by mouth daily.   finasteride 5 MG tablet Commonly known as: PROSCAR Take 1 tablet (5 mg total) by mouth daily.   fluticasone 50 MCG/ACT nasal spray Commonly known as: FLONASE Place 2 sprays into both nostrils daily.   folic acid A999333 MCG tablet Commonly known as: FOLVITE Take 400 mcg by mouth daily.   Incruse Ellipta 62.5 MCG/ACT Aepb Generic drug: umeclidinium bromide Inhale 1 puff into the lungs daily.   insulin NPH Human 100 UNIT/ML injection Commonly known as: NOVOLIN N Inject 2-3 Units into the skin daily as needed (for high blood sugar).   ipratropium-albuterol 0.5-2.5 (3) MG/3ML Soln Commonly known as: DUONEB Inhale 3  mLs into the lungs as needed (wheezing/SOB).   levothyroxine 25 MCG tablet Commonly known as: SYNTHROID Take 25 mcg by mouth daily.   metFORMIN 500 MG 24 hr tablet Commonly known as: GLUCOPHAGE-XR Take 2 tablets (1,000 mg total) by mouth 2 (two) times daily. What changed: how much to take   nirmatrelvir/ritonavir 20 x 150 MG & 10 x 100MG Tabs Commonly known as: PAXLOVID Take 3 tablets by mouth 2 (two) times daily for 3 days. Patient GFR is >60. Take nirmatrelvir (150 mg) two tablets twice daily for 5 days and ritonavir (100 mg) one tablet twice daily for 5 days.   pantoprazole 40 MG tablet Commonly known as: PROTONIX Take 40 mg by mouth daily.   predniSONE 1 MG tablet Commonly known as: DELTASONE Take 3 tablets (3 mg total) by mouth daily.   Tyvaso DPI Maintenance Kit 48 MCG Powd Generic drug: Treprostinil Inhale 48 mcg into the lungs in the morning, at noon, in the evening, and at bedtime.               Durable Medical Equipment  (From admission, onward)           Start     Ordered   12/19/22 1340  For home use only DME lightweight manual wheelchair with seat cushion  Once       Comments: Patient suffers from weakness which impairs their ability to perform daily activities like bathing in the home.  A cane will not resolve  issue with performing activities of daily living. A wheelchair will allow patient to safely perform daily activities. Patient is not able to propel themselves in the home using a standard weight wheelchair due to endurance. Patient can self propel in the lightweight wheelchair. Length of need Lifetime. Accessories: elevating leg rests (ELRs), wheel locks, extensions and anti-tippers.   12/19/22 1339   12/11/22 1605  DME Oxygen  (Discharge Planning)  Once       Question Answer Comment  Length of Need Lifetime   Mode or (Route) Nasal cannula   Liters per Minute 10   Frequency Continuous (stationary and portable oxygen unit needed)   Oxygen  delivery system Gas      12/11/22 1604            Follow-up Information     Merrilee Seashore, MD Follow up.   Specialty: Internal Medicine Contact information: [address] [place] [address] [place] [facility].   Specialty: Hospice Why: outpatient palliative services. Contact information: [address]  Discharge Exam: Filed Weights   12/15/22 0600 12/16/22 0540 12/18/22 1201  Weight: 65.2 kg 67.7 kg 60.1 kg   General-appears in no acute distress Heart-S1-S2, regular, no murmur auscultated Lungs-clear to auscultation bilaterally, no wheezing or crackles auscultated Abdomen-soft, nontender, no organomegaly Extremities-no edema in the lower extremities Neuro-alert, oriented x3, no focal deficit noted  Condition at discharge: good  The results of significant diagnostics from this hospitalization (including imaging, microbiology, ancillary and laboratory) are listed below for reference.   Imaging Studies: VAS [place] LOWER EXTREMITY VENOUS (DVT)  Result Date: 12/11/2022  Lower Venous DVT Study Patient Name:  [name]  Date of Exam:   12/10/2022 Medical Rec #: [id number]     Accession #:    [id number] Date of Birth: October 20, 1946     Patient Gender: M Patient Age:   68 years Exam Location:  [facility] Procedure:      VAS [place] LOWER EXTREMITY VENOUS (DVT) Referring Phys: Andres Labrum --------------------------------------------------------------------------------  Indications: Pulmonary embolism.  Risk Factors: Confirmed PE. Limitations: Poor ultrasound/tissue interface and patient positioning. Comparison Study: No prior studies. Performing Technologist: Oliver Hum RVT  Examination Guidelines: A complete evaluation includes B-mode imaging, spectral Doppler, color Doppler, and power Doppler as needed of all accessible portions of each  vessel. Bilateral testing is considered an integral part of a complete examination. Limited examinations for reoccurring indications may be performed as noted. The reflux portion of the exam is performed with the patient in reverse Trendelenburg.  +---------+---------------+---------+-----------+----------+--------------+ RIGHT    CompressibilityPhasicitySpontaneityPropertiesThrombus Aging +---------+---------------+---------+-----------+----------+--------------+ CFV      Full           Yes      No                                  +---------+---------------+---------+-----------+----------+--------------+ SFJ      Full                                                        +---------+---------------+---------+-----------+----------+--------------+ FV Prox  Full                                                        +---------+---------------+---------+-----------+----------+--------------+ FV Mid   Full                                                        +---------+---------------+---------+-----------+----------+--------------+ FV DistalFull                                                        +---------+---------------+---------+-----------+----------+--------------+ PFV      Full                                                        +---------+---------------+---------+-----------+----------+--------------+  POP      Full           Yes      No                                  +---------+---------------+---------+-----------+----------+--------------+ PTV      Full                                                        +---------+---------------+---------+-----------+----------+--------------+ PERO     Full                                                        +---------+---------------+---------+-----------+----------+--------------+   +---------+---------------+---------+-----------+----------+--------------+ LEFT      CompressibilityPhasicitySpontaneityPropertiesThrombus Aging +---------+---------------+---------+-----------+----------+--------------+ CFV      Full           Yes      No                                  +---------+---------------+---------+-----------+----------+--------------+ SFJ      Full                                                        +---------+---------------+---------+-----------+----------+--------------+ FV Prox  Full                                                        +---------+---------------+---------+-----------+----------+--------------+ FV Mid   Full                                                        +---------+---------------+---------+-----------+----------+--------------+ FV DistalFull                                                        +---------+---------------+---------+-----------+----------+--------------+ PFV      Full                                                        +---------+---------------+---------+-----------+----------+--------------+ POP      Full           Yes      No                                  +---------+---------------+---------+-----------+----------+--------------+  PTV      Full                                                        +---------+---------------+---------+-----------+----------+--------------+ PERO     Full                                                        +---------+---------------+---------+-----------+----------+--------------+ Gastroc  Partial                                      Acute          +---------+---------------+---------+-----------+----------+--------------+     Summary: RIGHT: - There is no evidence of no deep vein thrombosis in the lower extremity.  - No cystic structure found in the popliteal fossa.  LEFT: - Findings consistent with acute calf muscle vein thrombosis involving the left gastrocnemius veins. - No cystic structure found in  the popliteal fossa.  *See table(s) above for measurements and observations. Electronically signed by Orlie Pollen on 12/11/2022 at 1:34:21 PM.    Final    ECHOCARDIOGRAM COMPLETE  Result Date: 12/09/2022    ECHOCARDIOGRAM REPORT   Patient Name:   [name] Date of Exam: 12/09/2022 Medical Rec #:  [id number]    Height:       66.0 in Accession #:    [id number]   Weight:       147.0 lb Date of Birth:  10-20-1946    BSA:          1.755 m Patient Age:    19 years     BP:           108/65 mmHg Patient Gender: M            HR:           71 bpm. Exam Location:  Inpatient Procedure: 2D Echo, Cardiac Doppler and Color Doppler Indications:    Pulmonary embolus  History:        Patient has prior history of Echocardiogram examinations, most                 recent 11/15/2020. COPD; Risk Factors:Hypertension and Diabetes.                 COVID-19.  Sonographer:    Clayton Lefort RDCS (AE) Referring Phys: [id number] [place]  1. Left ventricular ejection fraction, by estimation, is 50 to 55%. The left ventricle has low normal function. The left ventricle has no regional wall motion abnormalities. There is mild left ventricular hypertrophy. Left ventricular diastolic parameters are consistent with Grade I diastolic dysfunction (impaired relaxation). There is the interventricular septum is flattened in systole, consistent with right ventricular pressure overload.  2. Right ventricular systolic function is moderately reduced. The right ventricular size is moderately enlarged. Moderately increased right ventricular wall thickness. There is severely elevated pulmonary artery systolic pressure. The estimated right ventricular systolic pressure is 99991111 mmHg.  3. A small pericardial effusion is present.  4. The mitral valve is normal in structure. Trivial mitral valve regurgitation. No evidence of  mitral stenosis.  5. Tricuspid valve regurgitation is moderate to severe.  6. The aortic valve is tricuspid. Aortic valve  regurgitation is trivial. No aortic stenosis is present.  7. The inferior vena cava is dilated in size with <50% respiratory variability, suggesting right atrial pressure of 15 mmHg. FINDINGS  Left Ventricle: Left ventricular ejection fraction, by estimation, is 50 to 55%. The left ventricle has low normal function. The left ventricle has no regional wall motion abnormalities. The left ventricular internal cavity size was normal in size. There is mild left ventricular hypertrophy. The interventricular septum is flattened in systole, consistent with right ventricular pressure overload. Left ventricular diastolic parameters are consistent with Grade I diastolic dysfunction (impaired relaxation). Right Ventricle: The right ventricular size is moderately enlarged. Moderately increased right ventricular wall thickness. Right ventricular systolic function is moderately reduced. There is severely elevated pulmonary artery systolic pressure. The tricuspid regurgitant velocity is 4.49 m/s, and with an assumed right atrial pressure of 15 mmHg, the estimated right ventricular systolic pressure is 99991111 mmHg. Left Atrium: Left atrial size was normal in size. Right Atrium: Right atrial size was normal in size. Pericardium: A small pericardial effusion is present. Mitral Valve: The mitral valve is normal in structure. Trivial mitral valve regurgitation. No evidence of mitral valve stenosis. Tricuspid Valve: The tricuspid valve is normal in structure. Tricuspid valve regurgitation is moderate to severe. Aortic Valve: The aortic valve is tricuspid. Aortic valve regurgitation is trivial. No aortic stenosis is present. Aortic valve mean gradient measures 2.0 mmHg. Aortic valve peak gradient measures 3.2 mmHg. Aortic valve area, by VTI measures 2.96 cm. Pulmonic Valve: The pulmonic valve was not well visualized. Pulmonic valve regurgitation is trivial. Aorta: The aortic root and ascending aorta are structurally normal, with no  evidence of dilitation. Venous: The inferior vena cava is dilated in size with less than 50% respiratory variability, suggesting right atrial pressure of 15 mmHg. IAS/Shunts: The interatrial septum was not well visualized.  LEFT VENTRICLE PLAX 2D LVIDd:         4.30 cm   Diastology LVIDs:         3.10 cm   LV e' medial:    4.46 cm/s LV PW:         1.20 cm   LV E/e' medial:  10.1 LV IVS:        1.10 cm   LV e' lateral:   5.33 cm/s LVOT diam:     2.30 cm   LV E/e' lateral: 8.4 LV SV:         58 LV SV Index:   33 LVOT Area:     4.15 cm  RIGHT VENTRICLE            IVC RV Basal diam:  3.30 cm    IVC diam: 2.30 cm RV S prime:     9.25 cm/s TAPSE (M-mode): 1.7 cm LEFT ATRIUM             Index        RIGHT ATRIUM           Index LA diam:        3.30 cm 1.88 cm/m   RA Area:     14.80 cm LA Vol (A2C):   30.9 ml 17.61 ml/m  RA Volume:   35.90 ml  20.46 ml/m LA Vol (A4C):   56.2 ml 32.03 ml/m LA Biplane Vol: 45.4 ml 25.87 ml/m  AORTIC VALVE AV Area (Vmax):    2.99 cm  AV Area (Vmean):   2.82 cm AV Area (VTI):     2.96 cm AV Vmax:           90.00 cm/s AV Vmean:          58.100 cm/s AV VTI:            0.195 m AV Peak Grad:      3.2 mmHg AV Mean Grad:      2.0 mmHg LVOT Vmax:         64.80 cm/s LVOT Vmean:        39.400 cm/s LVOT VTI:          0.139 m LVOT/AV VTI ratio: 0.71  AORTA Ao Root diam: 3.70 cm Ao Asc diam:  3.00 cm MITRAL VALVE               TRICUSPID VALVE MV Area (PHT): 2.54 cm    TR Peak grad:   80.6 mmHg MV Decel Time: 299 msec    TR Vmax:        449.00 cm/s MV E velocity: 45.00 cm/s MV A velocity: 68.10 cm/s  SHUNTS MV E/A ratio:  0.66        Systemic VTI:  0.14 m                            Systemic Diam: 2.30 cm Oswaldo Milian MD Electronically signed by Oswaldo Milian MD Signature Date/Time: 12/09/2022/5:06:20 PM    Final    CT Angio Chest Pulmonary Embolism (PE) W or WO Contrast  Result Date: 12/08/2022 CLINICAL DATA:  Pulmonary embolism (PE) suspected, high prob EXAM: CT ANGIOGRAPHY CHEST  WITH CONTRAST TECHNIQUE: Multidetector CT imaging of the chest was performed using the standard protocol during bolus administration of intravenous contrast. Multiplanar CT image reconstructions and MIPs were obtained to evaluate the vascular anatomy. RADIATION DOSE REDUCTION: This exam was performed according to the departmental dose-optimization program which includes automated exposure control, adjustment of the mA and/or kV according to patient size and/or use of iterative reconstruction technique. CONTRAST:  3m OMNIPAQUE IOHEXOL 350 MG/ML SOLN COMPARISON:  Chest XR, 12/07/2022.  CT chest, 06/10/2022. FINDINGS: Cardiovascular: *Satisfactory opacification of the pulmonary arteries to the segmental level. *POSITIVE for bilateral pulmonary emboli, at the inferior basilar posterior segmental pulmonary arteries. *CT evidence of RIGHT heart strain, with RV/LV ratio of 1.8 *Dilated main PA, measuring 3.2 cm. *RIGHT heart enlargement.  Small pericardial effusion. *Moderate-to-severe burden of multivessel coronary atherosclerosis, greatest within the LAD. Mediastinum/Nodes: No enlarged mediastinal, hilar, or axillary lymph nodes. 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 Trachea, and esophagus demonstrate no significant findings. Lungs/Pleura: *Similar pulmonary findings with basilar-predominant honeycombing with pulmonary interstitial thickening and apical-predominant ground-glass opacities. *Superimposed air-fluid levels within the pulmonary cysts, greatest within the dependent RIGHT basilar lung. *Mild RIGHT basilar basilar dependent consolidation. No discrete mass or suspicious pulmonary nodule. *No pleural effusion or pneumothorax. Upper Abdomen: No acute abnormality. Intraluminal contrast opacification of the stomach, likely previously ingested. Musculoskeletal: No acute chest wall abnormality. No acute or significant osseous findings. Review of the MIP images confirms the above findings. IMPRESSION: 1.  Bilateral inferior basilar posterior segmental pulmonary emboli. Examination is POSITIVE for acute PE with CT evidence of right heart strain (RV/LV Ratio = 1.8) consistent with at least submassive (intermediate risk) PE. The presence of right heart strain has been associated with an increased risk of morbidity and mortality. Please refer to the "Code PE Focused" order  set in EPIC. 2. Main pulmonary artery dilation, likely reflecting underlying pulmonary arterial hypertension. 3. Chronic interstitial lung findings, relatively similar in degree to comparison high-resolution CT chest (06/10/2022). Air-fluid levels and basilar dependent consolidation is suspicious for superimposed aspiration pneumonia. 4. 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 Recommend non-emergent, outpatient US Thyroid for further evaluation. 5. Coronary atherosclerosis. Additional incidental, chronic and senescent findings as above. These results will be called to the ordering clinician or representative by the Radiologist Assistant, and communication documented in the PACS or [employer]. Electronically Signed   By: Michaelle Birks [name]   On: 12/08/2022 12:03   DG Chest 2 View  Result Date: 12/07/2022 CLINICAL DATA:  Increased short of breath EXAM: CHEST - 2 VIEW COMPARISON:  05/13/2022, CT 06/10/2022 FINDINGS: Chronic reticular interstitial opacity corresponding to fibrosis and chronic lung disease. No acute airspace disease or pleural effusion. Stable cardiomediastinal silhouette. No pneumothorax. IMPRESSION: No active cardiopulmonary disease. Chronic interstitial lung disease. Electronically Signed   By: Donavan Foil [name]   On: 12/07/2022 21:51    Microbiology: Results for orders placed or performed during the hospital encounter of 12/07/22  Resp panel by RT-PCR (RSV, Flu A&B, Covid) Anterior Nasal Swab     Status: Abnormal   Collection Time: 12/07/22  8:32 PM   Specimen: Anterior Nasal Swab  Result Value Ref Range  Status   SARS Coronavirus 2 by RT PCR POSITIVE (A) NEGATIVE Final   Influenza A by PCR NEGATIVE NEGATIVE Final   Influenza B by PCR NEGATIVE NEGATIVE Final    Comment: (NOTE) The Xpert Xpress SARS-CoV-2/FLU/RSV plus assay is intended as an aid in the diagnosis of influenza from Nasopharyngeal swab specimens and should not be used as a sole basis for treatment. Nasal washings and aspirates are unacceptable for Xpert Xpress SARS-CoV-2/FLU/RSV testing.  Fact Sheet for Patients: EntrepreneurPulse.com.au  Fact Sheet for Healthcare Providers: IncredibleEmployment.be  This test is not yet approved or cleared by the [place] FDA and has been authorized for detection and/or diagnosis of SARS-CoV-2 by FDA under an Emergency Use Authorization (EUA). This EUA will remain in effect (meaning this test can be used) for the duration of the COVID-19 declaration under Section 564(b)(1) of the Act, 21 U.S.C. section 360bbb-3(b)(1), unless the authorization is terminated or revoked.     Resp Syncytial Virus by PCR NEGATIVE NEGATIVE Final    Comment: (NOTE) Fact Sheet for Patients: EntrepreneurPulse.com.au  Fact Sheet for Healthcare Providers: IncredibleEmployment.be  This test is not yet approved or cleared by the [place] FDA and has been authorized for detection and/or diagnosis of SARS-CoV-2 by FDA under an Emergency Use Authorization (EUA). This EUA will remain in effect (meaning this test can be used) for the duration of the COVID-19 declaration under Section 564(b)(1) of the Act, 21 U.S.C. section 360bbb-3(b)(1), unless the authorization is terminated or revoked.  Performed at [facility], [place] [address] [place], [place] [address]   Resp panel by RT-PCR (RSV, Flu A&B, Covid) Anterior Nasal Swab     Status: None   Collection Time: 12/18/22 12:36 PM   Specimen: Anterior Nasal Swab  Result Value Ref  Range Status   SARS Coronavirus 2 by RT PCR NEGATIVE NEGATIVE Final   Influenza A by PCR NEGATIVE NEGATIVE Final   Influenza B by PCR NEGATIVE NEGATIVE Final    Comment: (NOTE) The Xpert Xpress SARS-CoV-2/FLU/RSV plus assay is intended as an aid in the diagnosis of influenza from Nasopharyngeal swab specimens and should not  be used as a sole basis for treatment. Nasal washings and aspirates are unacceptable for Xpert Xpress SARS-CoV-2/FLU/RSV testing.  Fact Sheet for Patients: EntrepreneurPulse.com.au  Fact Sheet for Healthcare Providers: IncredibleEmployment.be  This test is not yet approved or cleared by the [place] FDA and has been authorized for detection and/or diagnosis of SARS-CoV-2 by FDA under an Emergency Use Authorization (EUA). This EUA will remain in effect (meaning this test can be used) for the duration of the COVID-19 declaration under Section 564(b)(1) of the Act, 21 U.S.C. section 360bbb-3(b)(1), unless the authorization is terminated or revoked.     Resp Syncytial Virus by PCR NEGATIVE NEGATIVE Final    Comment: (NOTE) Fact Sheet for Patients: EntrepreneurPulse.com.au  Fact Sheet for Healthcare Providers: IncredibleEmployment.be  This test is not yet approved or cleared by the [place] FDA and has been authorized for detection and/or diagnosis of SARS-CoV-2 by FDA under an Emergency Use Authorization (EUA). This EUA will remain in effect (meaning this test can be used) for the duration of the COVID-19 declaration under Section 564(b)(1) of the Act, 21 U.S.C. section 360bbb-3(b)(1), unless the authorization is terminated or revoked.  Performed at [facility], [place] [address], [place], [place] [address]     Labs: CBC: Recent Labs  Lab 12/15/22 [phone] 12/17/22 0051 12/21/22 0847  WBC 1.6* 2.3* 4.1  NEUTROABS 1.0*  --   --   HGB 9.1* 9.4* 7.8*  HCT 29.0* 29.6*  24.2*  MCV 86.1 85.3 83.7  PLT 54* 84* 76*   Basic Metabolic Panel: Recent Labs  Lab 12/17/22 0051 12/18/22 0101 12/21/22 0847  NA 136 139 137  K 4.1 3.9 3.7  CL 94* 93* 92*  CO2 29 33* 35*  GLUCOSE 122* 127* 236*  BUN 19 21 13  $ CREATININE 0.89 0.95 0.82  CALCIUM 7.5* 7.9* 7.5*   Liver Function Tests: Recent Labs  Lab 12/17/22 0051 12/18/22 0101  AST 18 21  ALT 19 22  ALKPHOS 27* 36*  BILITOT 0.9 1.0  PROT 3.9* 4.5*  ALBUMIN 1.9* 2.3*   CBG: Recent Labs  Lab 12/21/22 0033 12/21/22 0439 12/21/22 0742 12/21/22 1135 12/21/22 1556  GLUCAP 143* 110* 81 223* 150*    Discharge time spent: greater than 30 minutes.  Signed: Oswald Hillock, MD [facility] 12/21/2022

## 2022-12-22 ENCOUNTER — Inpatient Hospital Stay (HOSPITAL_COMMUNITY): Payer: Medicare Other

## 2022-12-22 LAB — GLUCOSE, CAPILLARY
Glucose-Capillary: 116 mg/dL — ABNORMAL HIGH (ref 70–99)
Glucose-Capillary: 169 mg/dL — ABNORMAL HIGH (ref 70–99)
Glucose-Capillary: 175 mg/dL — ABNORMAL HIGH (ref 70–99)
Glucose-Capillary: 208 mg/dL — ABNORMAL HIGH (ref 70–99)
Glucose-Capillary: 74 mg/dL (ref 70–99)
Glucose-Capillary: 94 mg/dL (ref 70–99)
Glucose-Capillary: 95 mg/dL (ref 70–99)

## 2022-12-22 LAB — BLOOD GAS, ARTERIAL
Acid-Base Excess: 16.6 mmol/L — ABNORMAL HIGH (ref 0.0–2.0)
Bicarbonate: 41.8 mmol/L — ABNORMAL HIGH (ref 20.0–28.0)
Drawn by: 548791
O2 Saturation: 99.5 %
Patient temperature: 36.9
pCO2 arterial: 50 mmHg — ABNORMAL HIGH (ref 32–48)
pH, Arterial: 7.53 — ABNORMAL HIGH (ref 7.35–7.45)
pO2, Arterial: 265 mmHg — ABNORMAL HIGH (ref 83–108)

## 2022-12-22 MED ORDER — INSULIN ASPART 100 UNIT/ML IJ SOLN
0.0000 [IU] | Freq: Three times a day (TID) | INTRAMUSCULAR | Status: DC
  Administered 2022-12-22: 2 [IU] via SUBCUTANEOUS
  Administered 2022-12-23: 3 [IU] via SUBCUTANEOUS

## 2022-12-22 MED ORDER — INSULIN ASPART 100 UNIT/ML IJ SOLN: 0.0000 [IU] | Freq: Three times a day (TID) | INTRAMUSCULAR | 11 refills | Status: DC

## 2022-12-22 MED ORDER — APIXABAN 2.5 MG PO TABS: 2.5000 mg | ORAL_TABLET | Freq: Two times a day (BID) | ORAL | 0 refills | Status: AC

## 2022-12-22 MED ORDER — ACETAMINOPHEN 500 MG PO TABS: 1000.0000 mg | ORAL_TABLET | Freq: Four times a day (QID) | ORAL | 0 refills | Status: DC | PRN

## 2022-12-22 NOTE — Progress Notes (Signed)
RT responded to rapid response for desaturation. Upon arrival pt eyes closed, sleepy appearing but does arouse and answer appropriately. Pt on non rebreather, SpO2 96%. ABG sample drawn per physician order x1 attempt. Pt weaned to 5L nasal cannula. No further needs at this time. RT will continue to monitor and be available as needed.

## 2022-12-22 NOTE — Discharge Summary (Addendum)
DISCHARGE SUMMARY  [name]  MR#: [id number]  DOB:20-Sep-1946  Date of Admission: 12/07/2022 Date of Discharge: 12/22/2022  Attending Physician:Annaly Skop Hennie Duos, MD  Patient's [id number], Mauro Kaufmann, MD  Disposition: D/C to SNF   Follow-up Appts:  Follow-up Information     Merrilee Seashore, MD Follow up.   Specialty: Internal Medicine Contact information: [address] [place] [address] [place] Follow up.   Specialty: Hospice Why: outpatient palliative services. Contact information: [address] [phone]                Tests Needing Follow-up: Follow-up with Pulmonology as outpatient Because of immunosuppressed status, patient was treated with Paxlovid and remdesivir for an extended period of time.  Last day of Paxlovid was 12/22/2022 with agreement between Internal Medicine and ID this was appropriate.   Repeat COVID-19 test on 12/18/2022 was negative Arrange for f/u with his Hematologist Arrange for f/u with his Rheumatologist  Referral for outpatient Palliative Care   Discharge Diagnoses: Principal Problem:   Acute and chronic respiratory failure with hypoxia ([place]) Active Problems:   COVID-19 virus infection   Pulmonary hypertension ([place])   COPD with acute exacerbation (HCC)   Pancytopenia (HCC)   Lobar pneumonia ([place])   Thrombocytopenia ([place])   Rheumatoid arthritis ([place])   Hypothyroidism   Dyslipidemia   Essential hypertension   Hypomagnesemia   Hypokalemia   Elevated troponin I level   Type 2 diabetes mellitus (HCC)   Pressure injury of skin   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 M with RA, ILD on Actemra and Imuran, emphysema and Pulmonary HTN on Tyvaso, and chronic respiratory failure on 4-5L supplemental O2 at baseline, and a recent COVID infection complicated by pancytopenia  who presented to the hospital 12/07/22 with worsening dyspnea in respiratory distress.      Review of Recent Medical Course: 1/5-1/12: Admitted to [facility] for COVID, ABG showed pO2 36 on home 4L admitted to ICU on [place] and BiPAP; improved on steroids and discharged on Augmentin   1/21-1/29: Readmitted to [facility] with SOB/fever, this time found to have pancytopenia platelets 50K; treated with cefepime x9 days and discharged again home with [facility] on prednisone  Hospital Course: 2/5: Readmitted (this time to [facility]) for respiratory distress 2/6: CTa noted B inferior basilar posterior segmental pulmonary emboli - PCCM consulted, recommended heparin, steroids 2/7: Echo shows stable cor pulmonale and pHTN 2/8: DVT study shows L gastroc DVT 2/9: Switched to apixaban, weaned to 6L at rest, but still 10L with ambulation 2/10: Still on 12L O2 with ambulation, Pulmonary re-consulted 2/12: Patient has been weaned down to 5L O2 by [place] 2/17: Patient was supposed to go home with home health PT however had syncopal episode in the hospital.  Discharge was held 2/19: patient cleared to go to SNF; SNF would not accept patient on Paxlovid 2/20: IM and ID discuss case and feel it is appropriate to d/c Paxlovid after last dose 2/20 - medically stable for d/c to SNF   Multifactorial Acute on chronic hypoxic respiratory failure  -submassive PE + underlying ILD w/ baseline high O2 requirement of ~5l [place]  -PCCM assisted in care during this admit  -cont anticoag after d/c  -has been successfully weaned back to his home O2 dose at the time of d/c    COPD with acute exacerbation -has  consistently improve w/ steroids and bronchodilators   COVID-19 virus infection -Tested positive Nov 06, 2022 -Given use of Rituxan ongoing COVID infection was considered during this admission  -prolonged remdesivir > paxlovid dosing provided with goal to minimize risk for recurrent - dosing completed 12/22/22 -COVID IGg Abs was  nonreactive due to Rituxan -Discussed with ID Dr. Karolee Ohs and Pulmonology Dr. Etta Quill during later portion of the hospitalization -Repeat SARS-CoV-2 RT-PCR 12/18/22 was negative   Dyslipidemia - Continue Lipitor   Essential hypertension - Hold amlodipine given soft BP and propensity for orthostasis    Hypomagnesemia Supplemented to normal range   Hypokalemia Supplemented to normal range    Elevated troponin  Likely myocardial injury due to COVID, ischemia ruled out   Type 2 diabetes mellitus  Glucose controlled - Continue home regimen   Acute pulmonary embolism - Continue apixaban - Consulted IR for thrombectomy without tPA - The patient is not a candidate for catheter directed thrombectomy.   Thrombocytopenia  See below   Rheumatoid arthritis  - Hold Actemra, Imuran - no acute flair at this time  - Continue steroids -Follow-up with Rheumatology as outpatient   Pressure injury of skin Sacrum, stage II, present on admission - Wound care directed care of this issue  Pressure Injury 12/08/22 Sacrum Mid Stage 2 -  Partial thickness loss of dermis presenting as a shallow open injury with a red, pink wound bed without slough. (Active)  12/08/22 1905  Location: Sacrum  Location Orientation: Mid  Staging: Stage 2 -  Partial thickness loss of dermis presenting as a shallow open injury with a red, pink wound bed without slough.  Wound Description (Comments):   Present on Admission: Yes    Pulmonary hypertension  Echo noted severe pulmonary hypertension and TR, estimated pressures may be higher than last echo - care was directed by Pulmonology - was diuresed as tolerated during this admit -Follow-up w/ pulmonology as outpatient   Hypothyroidism - Continue levothyroxine   Pancytopenia This first was noted mid Jan during admission at [facility], again WBC <4, Hgb <8, and platelets <50.  Suspect bone marrow suppression from COVID, in setting of Imuran B12, folate,  smear unremarkable.  Retics hypoproliferative. - Hold Imuran now and at discharge - Follow up with Hematology and Rheumatology and Pulmonology on discharge.   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 - Continue steroids - Hold Imuran as above  - Pulmonology  has signed off -Palliative Care consulted for goals of care, patient wants to follow-up with Palliative Care as outpatient     Allergies as of 12/22/2022       Reactions   Bactrim [sulfamethoxazole-trimethoprim] Shortness Of Breath   Patient states "got short winded"   Spiriva Respimat [tiotropium Bromide Monohydrate] Rash   Ofev [nintedanib] Other (See Comments)   Unknown         Medication List     STOP taking these medications    Actemra ACTPen 162 MG/0.9ML [place] Generic drug: Tocilizumab   amLODipine 10 MG tablet Commonly known as: NORVASC   aspirin 81 MG tablet   azaTHIOprine 50 MG tablet Commonly known as: IMURAN   insulin NPH Human 100 UNIT/ML injection Commonly known as: NOVOLIN N       TAKE these medications    acetaminophen 500 MG tablet Commonly known as: TYLENOL Take 2 tablets (1,000 mg total)  by mouth every 6 (six) hours as needed for moderate pain or headache. What changed: when to take this   albuterol 108 (90 Base) MCG/ACT inhaler Commonly known as: VENTOLIN HFA Inhale 4-5 puffs into the lungs as needed for wheezing or shortness of breath.   alfuzosin 10 MG 24 hr tablet Commonly known as: UROXATRAL Take 1 tablet (10 mg total) by mouth daily with breakfast.   apixaban 2.5 MG Tabs tablet Commonly known as: ELIQUIS Take 1 tablet (2.5 mg total) by mouth 2 (two) times daily for 1 day. Start taking on: December 23, 2022   apixaban 5 MG Tabs tablet Commonly known as: ELIQUIS Take 1 tablet (5 mg total) by mouth 2 (two) times daily. Start taking this  medication from 12/24/2022 after you have completed Eliquis 2.5 mg twice a day for 5 days. Start taking on: December 24, 2022   atorvastatin 20 MG tablet Commonly known as: LIPITOR Take 20 mg by mouth daily.   finasteride 5 MG tablet Commonly known as: PROSCAR Take 1 tablet (5 mg total) by mouth daily.   fluticasone 50 MCG/ACT nasal spray Commonly known as: FLONASE Place 2 sprays into both nostrils daily.   folic acid A999333 MCG tablet Commonly known as: FOLVITE Take 400 mcg by mouth daily.   Incruse Ellipta 62.5 MCG/ACT Aepb Generic drug: umeclidinium bromide Inhale 1 puff into the lungs daily.   insulin aspart 100 UNIT/ML injection Commonly known as: novoLOG Inject 0-9 Units into the skin 3 (three) times daily with meals.   ipratropium-albuterol 0.5-2.5 (3) MG/3ML Soln Commonly known as: DUONEB Inhale 3 mLs into the lungs as needed (wheezing/SOB).   levothyroxine 25 MCG tablet Commonly known as: SYNTHROID Take 25 mcg by mouth daily.   metFORMIN 500 MG 24 hr tablet Commonly known as: GLUCOPHAGE-XR Take 2 tablets (1,000 mg total) by mouth 2 (two) times daily. What changed: how much to take   pantoprazole 40 MG tablet Commonly known as: PROTONIX Take 40 mg by mouth daily.   predniSONE 1 MG tablet Commonly known as: DELTASONE Take 3 tablets (3 mg total) by mouth daily.   Tyvaso DPI Maintenance Kit 48 MCG Powd Generic drug: Treprostinil Inhale 48 mcg into the lungs in the morning, at noon, in the evening, and at bedtime.               Durable Medical Equipment  (From admission, onward)           Start     Ordered   12/19/22 1340  For home use only DME lightweight manual wheelchair with seat cushion  Once       Comments: Patient suffers from weakness which impairs their ability to perform daily activities like bathing in the home.  A cane will not resolve  issue with performing activities of daily living. A wheelchair will allow patient to safely perform  daily activities. Patient is not able to propel themselves in the home using a standard weight wheelchair due to endurance. Patient can self propel in the lightweight wheelchair. Length of need Lifetime. Accessories: elevating leg rests (ELRs), wheel locks, extensions and anti-tippers.   12/19/22 1339   12/11/22 1605  DME Oxygen  (Discharge Planning)  Once       Question Answer Comment  Length of Need Lifetime   Mode or (Route) Nasal cannula   Liters per Minute 10   Frequency Continuous (stationary and portable oxygen unit needed)   Oxygen delivery system Gas      12/11/22  1604            Day of Discharge BP (!) 106/93 (BP Location: Left Arm)   Pulse 89   Temp 98.4 F (36.9 C) (Oral)   Resp (!) 21   Ht 5' 6"$  (1.676 m)   Wt 60.1 kg   SpO2 96%   BMI 21.39 kg/m   Physical Exam: General: No acute respiratory distress - alert  Lungs: fine crackles diffusely - no wheeze Cardiovascular: Regular rate and rhythm without murmur  Abdomen: Nontender, nondistended, soft, bowel sounds positive, no rebound, no ascites, no appreciable mass Extremities: No significant cyanosis, clubbing, or edema bilateral lower extremities  Basic Metabolic Panel: Recent Labs  Lab 12/17/22 0051 12/18/22 0101 12/21/22 0847  NA 136 139 137  K 4.1 3.9 3.7  CL 94* 93* 92*  CO2 29 33* 35*  GLUCOSE 122* 127* 236*  BUN 19 21 13  $ CREATININE 0.89 0.95 0.82  CALCIUM 7.5* 7.9* 7.5*   CBC: Recent Labs  Lab 12/17/22 0051 12/21/22 0847  WBC 2.3* 4.1  HGB 9.4* 7.8*  HCT 29.6* 24.2*  MCV 85.3 83.7  PLT 84* 76*    Time spent in discharge (includes decision making & examination of pt): 35 minutes  12/22/2022, 3:48 PM   Cherene Altes, MD [facility] Office  [phone]

## 2022-12-22 NOTE — Progress Notes (Signed)
RR called as patient found to be unresponsive in chair. Patient at 4.5 L [place]. Vitals and CBG taken. Patient was temporary put on non rebreather and ABG collected. Patient more awake, alert, and orientated x4 and transferred back into bed. Patient on 4.5 L [place] saturating at 94%. Dr. Thereasa Solo made aware

## 2022-12-22 NOTE — TOC Progression Note (Signed)
Transition of Care [facility]) - Progression Note    Patient Details  Name: [name] MRN: [id number] Date of Birth: May 24, 1946  [facility] CM/SW Contact  Jinger Neighbors, [place] Phone Number: 12/22/2022, 4:14 PM  Clinical Narrative:     CSW consulted with Ebony Hail at [facility] who reports pt can not admit until he completes the antiviral. CSW updated medical team. Dr. Thereasa Solo stated he will d/c the med today. CSW followed up with Ebony Hail to ask if pt can come today if med is d/c'd. Ebony Hail f/u with CSW and stated if pt completes today, they will accept tomorrow.   Expected Discharge Plan: Skilled Nursing Facility Barriers to Discharge: No Barriers Identified  Expected Discharge Plan and Services In-house Referral: NA Discharge Planning Services: CM Consult Post Acute Care Choice: Home Health Living arrangements for the past 2 months: Mobile Home Expected Discharge Date: 12/22/22               DME Arranged: N/A DME Agency: NA       HH Arranged: PT HH Agency: [place] Date [place]: 12/19/22 Time [place]: [id number] Representative spoke with at [place]: [place] Determinants of Health ([place]) Interventions SDOH Screenings   Food Insecurity: No Food Insecurity (06/01/2019)  Housing: Low Risk  (06/01/2019)  Depression (PHQ2-9): Low Risk  (06/01/2019)  Financial Resource Strain: Low Risk  (06/01/2019)  Physical Activity: Inactive (06/01/2019)  Stress: No Stress Concern Present (06/01/2019)  Tobacco Use: Medium Risk (10/23/2022)    Readmission Risk Interventions    12/08/2022    3:54 PM  Readmission Risk Prevention Plan  Transportation Screening Complete  PCP or Specialist Appt within 3-5 Days Complete  HRI or [place] Complete  Social Work Consult for [place] Planning/Counseling Complete  Palliative Care Screening Not Applicable  Medication Review Press photographer) Complete

## 2022-12-22 NOTE — Significant Event (Signed)
Rapid Response Event Note   Reason for Call :  unresponsive  Initial Focused Assessment:  Patient sitting in chair with eyes closed, responds to voice, follows all commands. A&Ox4. Lungs and heart tones clear. Skin warm and dry. Patient states SOB is no different than every day. Patient wears 4-5L [place] at baseline.   106/93 HR 89 RR 21 O2 84% 6LNC CBG 116  Interventions:  NRB  CXR ABG MD to bedside  Plan of Care:  Patient placed back on [place] prior to leaving room.  Call back for further needs  Event Summary:  MD Notified: Sheral Flow Call Time: [phone] Arrival Time: [phone] End Time: 1025  Newman Nickels, RN

## 2022-12-22 NOTE — Progress Notes (Signed)
Physical Therapy Treatment Patient Details Name: [name] MRN: [id number] DOB: 01/25/1946 Today's Date: 12/22/2022   History of Present Illness 77 year old male presented to ED 2/6 with c/o shortness of breath and SpO2 dropping to the 70s on his 4L O2 baseline. CT angiogram of chest which showed bilateral peripheral pulmonary emboli, with right heart strain. Admitted for treatment of COPD exacerbation with hypoxia. PMH: COPD, type 2 diabetes mellitus, emphysema, hypertension, RA, osteoarthritis and hypothyroidism, pHTN on Tyvaso and chronic respiratory failure on 4-5L at home, diagnosis of COVID in January    PT Comments    Attempted to work on ambulation but pt orthostatic standing at bedside so unable to amb. Continue to recommend SNF for further rehab.   Orthostatic BPs  Supine 100/55  Sitting 90/54  Standing 94/56  Standing after 3 min 69/45  Return to supine 94/54      Recommendations for follow up therapy are one component of a multi-disciplinary discharge planning process, led by the attending physician.  Recommendations may be updated based on patient status, additional functional criteria and insurance authorization.  Follow Up Recommendations  Skilled nursing-short term rehab (<3 hours/day) Can patient physically be transported by private vehicle: Yes   Assistance Recommended at Discharge Frequent or constant Supervision/Assistance  Patient can return home with the following A little help with walking and/or transfers;A little help with bathing/dressing/bathroom;Assist for transportation;Help with stairs or ramp for entrance   Equipment Recommendations  Wheelchair (measurements PT);Wheelchair cushion (measurements PT);Rollator (4 wheels)    Recommendations for Other Services       Precautions / Restrictions Precautions Precautions: Fall Precaution Comments: watch SpO2; watch BP (orthostatic 2/18 and 2/20) Restrictions Weight Bearing Restrictions: No      Mobility  Bed Mobility Overal bed mobility: Needs Assistance Bed Mobility: Supine to Sit, Sit to Supine     Supine to sit: Supervision, HOB elevated Sit to supine: Min guard   General bed mobility comments: Incr time and effort to come to sitting. Min guard to return to supine due to orthostasis    Transfers Overall transfer level: Needs assistance Equipment used: Rolling walker (2 wheels), None Transfers: Sit to/from Stand Sit to Stand: Min assist           General transfer comment: Assist to stabilize on rising    Ambulation/Gait               General Gait Details: Unable due to symptomatic orthostasis   Stairs             Wheelchair Mobility    Modified Rankin (Stroke Patients Only)       Balance Overall balance assessment: Needs assistance Sitting-balance support: Feet supported, No upper extremity supported Sitting balance-Leahy Scale: Good     Standing balance support: Bilateral upper extremity supported, During functional activity, Reliant on assistive device for balance Standing balance-Leahy Scale: Poor Standing balance comment: walker and min guard for static standing                            Cognition Arousal/Alertness: Awake/alert Behavior During Therapy: Flat affect Overall Cognitive Status: Impaired/Different from baseline Area of Impairment: Following commands, Problem solving                       Following Commands: Follows one step commands consistently, Follows one step commands with increased time     Problem Solving: Slow processing  Exercises      General Comments General comments (skin integrity, edema, etc.): Pt on 5L O2 with SpO2 93% at rest. Decr to 87-88% with sitting EOB. Incr to 6L with SpO2 remaining >90% for remainder of session. See assessment for BP's      Pertinent Vitals/Pain Pain Assessment Pain Assessment: No/denies pain    Home Living                           Prior Function            PT Goals (current goals can now be found in the care plan section) Progress towards PT goals: Not progressing toward goals - comment (orthostatic)    Frequency    Min 2X/week      PT Plan Current plan remains appropriate;Frequency needs to be updated    Co-evaluation              AM-PAC PT "6 Clicks" Mobility   Outcome Measure  Help needed turning from your back to your side while in a flat bed without using bedrails?: None Help needed moving from lying on your back to sitting on the side of a flat bed without using bedrails?: A Little Help needed moving to and from a bed to a chair (including a wheelchair)?: A Little Help needed standing up from a chair using your arms (e.g., wheelchair or bedside chair)?: A Little Help needed to walk in hospital room?: Total Help needed climbing 3-5 steps with a railing? : Total 6 Click Score: 15    End of Session Equipment Utilized During Treatment: Oxygen;Gait belt Activity Tolerance: Treatment limited secondary to medical complications (Comment) (orthostatic) Patient left: in bed;with call bell/phone within reach;with bed alarm set Nurse Communication: Mobility status;Other (comment) (orthostatic) PT Visit Diagnosis: Unsteadiness on feet (R26.81);Muscle weakness (generalized) (M62.81);Difficulty in walking, not elsewhere classified (R26.2);Dizziness and giddiness (R42)     Time: 1400-1430 PT Time Calculation (min) (ACUTE ONLY): 30 min  Charges:  $Therapeutic Activity: 23-37 mins                     [place] 12/22/2022, 3:46 PM

## 2022-12-22 NOTE — Progress Notes (Signed)
ABG sample sent to lab. Sarah in lab notified. RT will continue to monitor and be available as needed.

## 2022-12-23 DIAGNOSIS — E43 Unspecified severe protein-calorie malnutrition: Secondary | ICD-10-CM | POA: Diagnosis not present

## 2022-12-23 DIAGNOSIS — R54 Age-related physical debility: Secondary | ICD-10-CM | POA: Diagnosis not present

## 2022-12-23 DIAGNOSIS — Z794 Long term (current) use of insulin: Secondary | ICD-10-CM | POA: Diagnosis not present

## 2022-12-23 DIAGNOSIS — I2699 Other pulmonary embolism without acute cor pulmonale: Secondary | ICD-10-CM | POA: Diagnosis not present

## 2022-12-23 DIAGNOSIS — J449 Chronic obstructive pulmonary disease, unspecified: Secondary | ICD-10-CM | POA: Diagnosis not present

## 2022-12-23 DIAGNOSIS — J988 Other specified respiratory disorders: Secondary | ICD-10-CM | POA: Diagnosis not present

## 2022-12-23 DIAGNOSIS — J8410 Pulmonary fibrosis, unspecified: Secondary | ICD-10-CM | POA: Diagnosis not present

## 2022-12-23 DIAGNOSIS — I700 Atherosclerosis of aorta: Secondary | ICD-10-CM | POA: Diagnosis not present

## 2022-12-23 DIAGNOSIS — R0902 Hypoxemia: Secondary | ICD-10-CM | POA: Diagnosis not present

## 2022-12-23 DIAGNOSIS — J9621 Acute and chronic respiratory failure with hypoxia: Secondary | ICD-10-CM | POA: Diagnosis not present

## 2022-12-23 DIAGNOSIS — D638 Anemia in other chronic diseases classified elsewhere: Secondary | ICD-10-CM | POA: Diagnosis not present

## 2022-12-23 DIAGNOSIS — J849 Interstitial pulmonary disease, unspecified: Secondary | ICD-10-CM | POA: Diagnosis not present

## 2022-12-23 DIAGNOSIS — I5031 Acute diastolic (congestive) heart failure: Secondary | ICD-10-CM | POA: Diagnosis not present

## 2022-12-23 DIAGNOSIS — D649 Anemia, unspecified: Secondary | ICD-10-CM | POA: Diagnosis not present

## 2022-12-23 DIAGNOSIS — D696 Thrombocytopenia, unspecified: Secondary | ICD-10-CM | POA: Diagnosis not present

## 2022-12-23 DIAGNOSIS — I10 Essential (primary) hypertension: Secondary | ICD-10-CM | POA: Diagnosis not present

## 2022-12-23 DIAGNOSIS — A419 Sepsis, unspecified organism: Secondary | ICD-10-CM | POA: Diagnosis not present

## 2022-12-23 DIAGNOSIS — Z743 Need for continuous supervision: Secondary | ICD-10-CM | POA: Diagnosis not present

## 2022-12-23 DIAGNOSIS — Z7952 Long term (current) use of systemic steroids: Secondary | ICD-10-CM | POA: Diagnosis not present

## 2022-12-23 DIAGNOSIS — I959 Hypotension, unspecified: Secondary | ICD-10-CM | POA: Diagnosis not present

## 2022-12-23 DIAGNOSIS — Z7401 Bed confinement status: Secondary | ICD-10-CM | POA: Diagnosis not present

## 2022-12-23 DIAGNOSIS — I469 Cardiac arrest, cause unspecified: Secondary | ICD-10-CM | POA: Diagnosis not present

## 2022-12-23 DIAGNOSIS — E876 Hypokalemia: Secondary | ICD-10-CM | POA: Diagnosis not present

## 2022-12-23 DIAGNOSIS — M069 Rheumatoid arthritis, unspecified: Secondary | ICD-10-CM | POA: Diagnosis not present

## 2022-12-23 DIAGNOSIS — J1282 Pneumonia due to coronavirus disease 2019: Secondary | ICD-10-CM | POA: Diagnosis not present

## 2022-12-23 DIAGNOSIS — I272 Pulmonary hypertension, unspecified: Secondary | ICD-10-CM | POA: Diagnosis not present

## 2022-12-23 DIAGNOSIS — N179 Acute kidney failure, unspecified: Secondary | ICD-10-CM | POA: Diagnosis not present

## 2022-12-23 DIAGNOSIS — R000 Tachycardia, unspecified: Secondary | ICD-10-CM | POA: Diagnosis not present

## 2022-12-23 DIAGNOSIS — Z8616 Personal history of COVID-19: Secondary | ICD-10-CM | POA: Diagnosis not present

## 2022-12-23 DIAGNOSIS — R0602 Shortness of breath: Secondary | ICD-10-CM | POA: Diagnosis not present

## 2022-12-23 DIAGNOSIS — R6520 Severe sepsis without septic shock: Secondary | ICD-10-CM | POA: Diagnosis not present

## 2022-12-23 DIAGNOSIS — Z66 Do not resuscitate: Secondary | ICD-10-CM | POA: Diagnosis not present

## 2022-12-23 DIAGNOSIS — J439 Emphysema, unspecified: Secondary | ICD-10-CM | POA: Diagnosis not present

## 2022-12-23 DIAGNOSIS — E119 Type 2 diabetes mellitus without complications: Secondary | ICD-10-CM | POA: Diagnosis not present

## 2022-12-23 DIAGNOSIS — J441 Chronic obstructive pulmonary disease with (acute) exacerbation: Secondary | ICD-10-CM | POA: Diagnosis not present

## 2022-12-23 DIAGNOSIS — B965 Pseudomonas (aeruginosa) (mallei) (pseudomallei) as the cause of diseases classified elsewhere: Secondary | ICD-10-CM | POA: Diagnosis not present

## 2022-12-23 DIAGNOSIS — N390 Urinary tract infection, site not specified: Secondary | ICD-10-CM | POA: Diagnosis not present

## 2022-12-23 DIAGNOSIS — E785 Hyperlipidemia, unspecified: Secondary | ICD-10-CM | POA: Diagnosis not present

## 2022-12-23 DIAGNOSIS — D61818 Other pancytopenia: Secondary | ICD-10-CM | POA: Diagnosis not present

## 2022-12-23 DIAGNOSIS — U071 COVID-19: Secondary | ICD-10-CM | POA: Diagnosis not present

## 2022-12-23 DIAGNOSIS — I2510 Atherosclerotic heart disease of native coronary artery without angina pectoris: Secondary | ICD-10-CM | POA: Diagnosis not present

## 2022-12-23 LAB — GLUCOSE, CAPILLARY
Glucose-Capillary: 134 mg/dL — ABNORMAL HIGH (ref 70–99)
Glucose-Capillary: 222 mg/dL — ABNORMAL HIGH (ref 70–99)
Glucose-Capillary: 79 mg/dL (ref 70–99)

## 2022-12-23 NOTE — Progress Notes (Signed)
Occupational Therapy Treatment Patient Details Name: [name] MRN: [id number] DOB: 11-30-1945 Today's Date: 12/23/2022   History of present illness 77 year old male presented to ED 2/6 with c/o shortness of breath and SpO2 dropping to the 70s on his 4L O2 baseline. CT angiogram of chest which showed bilateral peripheral pulmonary emboli, with right heart strain. Admitted for treatment of COPD exacerbation with hypoxia. PMH: COPD, type 2 diabetes mellitus, emphysema, hypertension, RA, osteoarthritis and hypothyroidism, pHTN on Tyvaso and chronic respiratory failure on 4-5L at home, diagnosis of COVID in January   OT comments  Patient limited by orthostatic during treatment. Patient BP in supine 112/48 and 135/67 seated on EOB. Patient performed grooming seated on EOB and asked to use BSC. Patient was min assist for transfer and able to perform toilet hygiene seated. Once back to EOB patient complained of dizziness and BP 85/54. Patient returned to supine and following 8 minutes at supine BP was 106/45. Patient often required cues for pursed lip breathing during visit. Patient would benefit from continued OT in SNF to address functional transfers and independence with self care. Acute OT to continue to follow.    Recommendations for follow up therapy are one component of a multi-disciplinary discharge planning process, led by the attending physician.  Recommendations may be updated based on patient status, additional functional criteria and insurance authorization.    Follow Up Recommendations  Skilled nursing-short term rehab (<3 hours/day)     Assistance Recommended at Discharge Frequent or constant Supervision/Assistance  Patient can return home with the following  A little help with walking and/or transfers;A little help with bathing/dressing/bathroom;Assistance with cooking/housework;Assist for transportation;Help with stairs or ramp for entrance   Equipment Recommendations  Other  (comment) (defer)    Recommendations for Other Services      Precautions / Restrictions Precautions Precautions: Fall Precaution Comments: watch SpO2; watch BP (orthostatic 2/18 and 2/20, and 2/21) Restrictions Weight Bearing Restrictions: No       Mobility Bed Mobility Overal bed mobility: Needs Assistance Bed Mobility: Supine to Sit, Sit to Supine     Supine to sit: Supervision, HOB elevated Sit to supine: Supervision   General bed mobility comments: supervision and increased time, cues to position self in bed    Transfers Overall transfer level: Needs assistance Equipment used: Rolling walker (2 wheels) Transfers: Sit to/from Stand, Bed to chair/wheelchair/BSC Sit to Stand: Min assist     Step pivot transfers: Min assist     General transfer comment: min assist for safety due to history of orthostasis     Balance Overall balance assessment: Needs assistance Sitting-balance support: Feet supported, No upper extremity supported Sitting balance-Leahy Scale: Good     Standing balance support: Bilateral upper extremity supported, During functional activity, Reliant on assistive device for balance Standing balance-Leahy Scale: Poor Standing balance comment: reliant on external device for safety                           ADL either performed or assessed with clinical judgement   ADL Overall ADL's : Needs assistance/impaired     Grooming: Sitting;Set up Grooming Details (indicate cue type and reason): on EOB                 Toilet Transfer: Minimal assistance;BSC/3in1;Rolling walker (2 wheels) Toilet Transfer Details (indicate cue type and reason): drop in BP following return to EOB Toileting- Clothing Manipulation and Hygiene: Set up;Sitting/lateral lean  Extremity/Trunk Assessment              Vision       Perception     Praxis      Cognition Arousal/Alertness: Awake/alert Behavior During Therapy: Flat  affect Overall Cognitive Status: Impaired/Different from baseline Area of Impairment: Following commands, Problem solving                     Memory: Decreased short-term memory Following Commands: Follows one step commands consistently, Follows one step commands with increased time Safety/Judgement: Decreased awareness of safety   Problem Solving: Slow processing          Exercises      Shoulder Instructions       General Comments supine HR 104, SpO2 92 on 5 liters, BP 112/48, seated on EOB BP 135/67, SpO2 88-90 HR 109, following transfer back to EOB from BSC BP 85/54 HR 110, SpO2 84, returned to supine with cues for pursed lip breathing and folloing 8 minutes BP 106/45, HR 120    Pertinent Vitals/ Pain          Home Living                                          Prior Functioning/Environment              Frequency  Min 2X/week        Progress Toward Goals  OT Goals(current goals can now be found in the care plan section)  Progress towards OT goals: Progressing toward goals  Acute Rehab OT Goals Patient Stated Goal: get better OT Goal Formulation: With patient Time For Goal Achievement: 01/03/23 Potential to Achieve Goals: Good ADL Goals Pt Will Perform Grooming: standing;with modified independence Pt Will Perform Lower Body Dressing: sit to/from stand;with modified independence Pt Will Transfer to Toilet: with modified independence;ambulating;regular height toilet Additional ADL Goal #1: Pt will tolerate 3+  consecutive standing ADL without seated rest break  Plan Discharge plan remains appropriate    Co-evaluation                 AM-PAC OT "6 Clicks" Daily Activity     Outcome Measure   Help from another person eating meals?: None Help from another person taking care of personal grooming?: A Little Help from another person toileting, which includes using toliet, bedpan, or urinal?: A Little Help from another  person bathing (including washing, rinsing, drying)?: A Little Help from another person to put on and taking off regular upper body clothing?: A Little Help from another person to put on and taking off regular lower body clothing?: A Little 6 Click Score: 19    End of Session Equipment Utilized During Treatment: Rolling walker (2 wheels)  OT Visit Diagnosis: Unsteadiness on feet (R26.81);Muscle weakness (generalized) (M62.81);Other symptoms and signs involving cognitive function   Activity Tolerance Other (comment) (orthostatic at end of session)   Patient Left in bed;with call bell/phone within reach;with bed alarm set   Nurse Communication Mobility status;Other (comment) (orthostatic, BP readings)        Time: [id number] OT Time Calculation (min): 23 min  Charges:    Lodema Hong, [place]  Office [place] 12/23/2022, 1:41 PM

## 2022-12-23 NOTE — Progress Notes (Addendum)
[facility] PROGRESS NOTE    Progress Note  [name]  [id number] DOB: [birth year] DOA: 12/07/2022 PCP: Merrilee Seashore, MD     Brief Narrative:   Traiton Lingg is an 77 y.o. male 77 y.o. M with RA, ILD on Actemra and Imuran, emphysema and Pulmonary HTN on Tyvaso, and chronic respiratory failure on 4-5L supplemental O2 at baseline, and a recent COVID infection complicated by pancytopenia who presented to the hospital 12/07/22 with worsening dyspnea in respiratory distress.     Hospital Course: 2/5: Readmitted (this time to [facility]) for respiratory distress 2/6: CTa noted B inferior basilar posterior segmental pulmonary emboli - PCCM consulted, recommended heparin, steroids 2/7: Echo shows stable cor pulmonale and pHTN 2/8: DVT study shows L gastroc DVT 2/9: Switched to apixaban, weaned to 6L at rest, but still 10L with ambulation 2/10: Still on 12L O2 with ambulation, Pulmonary re-consulted 2/12: Patient has been weaned down to 5L O2 by [place] 2/17: Patient was supposed to go home with home health PT however had syncopal episode in the hospital.  Discharge was held 2/19: patient cleared to go to SNF; SNF would not accept patient on Paxlovid 2/20: IM and ID discuss case and feel it is appropriate to d/c Paxlovid after last dose 2/20 - medically stable for d/c to SNF   Assessment/Plan:   Acute and chronic respiratory failure with hypoxia ([place]) likely due to submassive PE: Continue anticoagulation upon discharge has been weaned successfully to baseline oxygen. IR was consulted he is not a candidate for catheter directed thrombectomy. No changes overnight.  COPD with acute exacerbation: Is consistently improved with steroids continue steroid taper as an outpatient.  COVID-19 viral infection: Tested positive on November 06, 2022 as the patient is immunosuppressed on Rituxan it was considered an ongoing infection. Treated with prolonged treatment with remdesivir and Paxlovid and  completed his course on 12/22/2022.  Dyslipidemia:  Lipitor.  Essential hypertension: Hold amlodipine.  Hypomagnesemia: Normal range.  Hypokalemia: Unremarkable.  Elevated troponins: Likely due to myocardial injury due to COVID-19, ischemia has been ruled out.  Diabetes mellitus type 2: No change made to his oral regimen, will continue current management.  Rheumatoid arthritis: Holding immunosuppressive agents. Currently on steroids we will follow-up with rheumatology as an outpatient.  Sacral decubitus ulcer stage II present on admission: Continue wound care instructions. RN Pressure Injury Documentation: Pressure Injury 12/08/22 Sacrum Mid Stage 2 -  Partial thickness loss of dermis presenting as a shallow open injury with a red, pink wound bed without slough. (Active)  12/08/22 1905  Location: Sacrum  Location Orientation: Mid  Staging: Stage 2 -  Partial thickness loss of dermis presenting as a shallow open injury with a red, pink wound bed without slough.  Wound Description (Comments):   Present on Admission: Yes  Dressing Type None 12/22/22 2000    DVT prophylaxis: eliquis Family Communication:none Status is: Inpatient Remains inpatient appropriate because: Awaiting skilled nursing facility placement.    Code Status:     Code Status Orders  (From admission, onward)           Start     Ordered   12/12/22 1436  Do not attempt resuscitation (DNR)  Continuous       Question Answer Comment  If patient has no pulse and is not breathing Do Not Attempt Resuscitation   If patient has a pulse and/or is breathing: Medical Treatment Goals MEDICAL INTERVENTIONS DESIRED: Use advanced airway interventions, mechanical ventilation or cardioversion in appropriate circumstances; Use medication/IV  fluids as indicated; Provide comfort medications; Transfer to Progressive/Stepdown/ICU as indicated.   Consent: Discussion documented in EHR or advanced directives reviewed       12/12/22 1436           Code Status History     Date Active Date Inactive Code Status Order ID Comments User Context   12/08/2022 0403 12/12/2022 1436 DNR [id number]  Christel Mormon, MD ED   12/08/2022 0302 12/08/2022 0403 DNR [id number]  Delora Fuel, MD ED   05/21/2020 1106 05/21/2020 1911 Full Code [id number]  [place], Peter M, MD Inpatient   02/17/2020 1838 02/19/2020 1920 Full Code [id number]  Elgergawy, Silver Huguenin, MD Inpatient         IV Access:   Peripheral IV   Procedures and diagnostic studies:   DG Chest Port 1 View  Result Date: 12/22/2022 CLINICAL DATA:  Provided history: Acute respiratory distress. EXAM: PORTABLE CHEST 1 VIEW COMPARISON:  Chest CT 12/08/2022. Prior chest radiographs 12/07/2022. FINDINGS: The cardiomediastinal silhouette is unchanged. Changes of fibrotic lung disease bilaterally, unchanged from the recent prior chest radiographs of 12/07/2022. No appreciable superimposed acute airspace consolidation. No evidence of pleural effusion or pneumothorax. Degenerative changes of the spine and bilateral acromioclavicular joints. IMPRESSION: Changes of chronic fibrotic lung disease without appreciable superimposed acute airspace consolidation. Electronically Signed   By: Kellie Simmering D.O.   On: 12/22/2022 10:34     Medical Consultants:   None.   Subjective:    Kagan Mckamey no complaints  Objective:    Vitals:   12/22/22 2002 12/23/22 0608 12/23/22 0732 12/23/22 0754  BP: (!) 107/48 124/67 (!) 110/55   Pulse: 86 89 80   Resp: 15 16 18   $ Temp: (!) 97.4 F (36.3 C) (!) 97.2 F (36.2 C) 97.7 F (36.5 C)   TempSrc:  Oral    SpO2: 100% (!) 88% 100% 94%  Weight:      Height:       SpO2: 94 % O2 Flow Rate (L/min): 5 L/min FiO2 (%): 60 %   Intake/Output Summary (Last 24 hours) at 12/23/2022 0805 Last data filed at 12/23/2022 0050 Gross per 24 hour  Intake --  Output 650 ml  Net -650 ml   Filed Weights   12/15/22 0600 12/16/22 0540 12/18/22 1201   Weight: 65.2 kg 67.7 kg 60.1 kg    Exam: General exam: In no acute distress. Respiratory system: Good air movement and clear to auscultation. Cardiovascular system: S1 & S2 heard, RRR. No JVD. Gastrointestinal system: Abdomen is nondistended, soft and nontender.  Extremities: No pedal edema. Skin: No rashes, lesions or ulcers Psychiatry: Judgement and insight appear normal. Mood & affect appropriate.    Data Reviewed:    Labs: Basic Metabolic Panel: Recent Labs  Lab 12/17/22 0051 12/18/22 0101 12/21/22 0847  NA 136 139 137  K 4.1 3.9 3.7  CL 94* 93* 92*  CO2 29 33* 35*  GLUCOSE 122* 127* 236*  BUN 19 21 13  $ CREATININE 0.89 0.95 0.82  CALCIUM 7.5* 7.9* 7.5*   GFR Estimated Creatinine Clearance: 64.1 mL/min (by C-G formula based on SCr of 0.82 mg/dL). Liver Function Tests: Recent Labs  Lab 12/17/22 0051 12/18/22 0101  AST 18 21  ALT 19 22  ALKPHOS 27* 36*  BILITOT 0.9 1.0  PROT 3.9* 4.5*  ALBUMIN 1.9* 2.3*   No results for input(s): "LIPASE", "AMYLASE" in the last 168 hours. No results for input(s): "AMMONIA" in the last 168 hours. Coagulation profile No results  for input(s): "INR", "PROTIME" in the last 168 hours. COVID-19 Labs  No results for input(s): "DDIMER", "FERRITIN", "LDH", "CRP" in the last 72 hours.  Lab Results  Component Value Date   SARSCOV2NAA NEGATIVE 12/18/2022   SARSCOV2NAA POSITIVE (A) 12/07/2022   SARSCOV2NAA NEGATIVE 02/24/2021   [place] NEGATIVE 11/14/2020    CBC: Recent Labs  Lab 12/17/22 0051 12/21/22 0847  WBC 2.3* 4.1  HGB 9.4* 7.8*  HCT 29.6* 24.2*  MCV 85.3 83.7  PLT 84* 76*   Cardiac Enzymes: No results for input(s): "CKTOTAL", "CKMB", "CKMBINDEX", "TROPONINI" in the last 168 hours. BNP (last 3 results) No results for input(s): "PROBNP" in the last 8760 hours. CBG: Recent Labs  Lab 12/22/22 1204 12/22/22 1556 12/22/22 2005 12/23/22 0044 12/23/22 0733  GLUCAP 208* 175* 169* 134* 79   D-Dimer: No  results for input(s): "DDIMER" in the last 72 hours. Hgb A1c: No results for input(s): "HGBA1C" in the last 72 hours. Lipid Profile: No results for input(s): "CHOL", "HDL", "LDLCALC", "TRIG", "CHOLHDL", "LDLDIRECT" in the last 72 hours. Thyroid function studies: No results for input(s): "TSH", "T4TOTAL", "T3FREE", "THYROIDAB" in the last 72 hours.  Invalid input(s): "FREET3" Anemia work up: No results for input(s): "VITAMINB12", "FOLATE", "FERRITIN", "TIBC", "IRON", "RETICCTPCT" in the last 72 hours. Sepsis Labs: Recent Labs  Lab 12/17/22 0051 12/21/22 0847  WBC 2.3* 4.1   Microbiology Recent Results (from the past 240 hour(s))  Resp panel by RT-PCR (RSV, Flu A&B, Covid) Anterior Nasal Swab     Status: None   Collection Time: 12/18/22 12:36 PM   Specimen: Anterior Nasal Swab  Result Value Ref Range Status   SARS Coronavirus 2 by RT PCR NEGATIVE NEGATIVE Final   Influenza A by PCR NEGATIVE NEGATIVE Final   Influenza B by PCR NEGATIVE NEGATIVE Final    Comment: (NOTE) The Xpert Xpress SARS-CoV-2/FLU/RSV plus assay is intended as an aid in the diagnosis of influenza from Nasopharyngeal swab specimens and should not be used as a sole basis for treatment. Nasal washings and aspirates are unacceptable for Xpert Xpress SARS-CoV-2/FLU/RSV testing.  Fact Sheet for Patients: EntrepreneurPulse.com.au  Fact Sheet for Healthcare Providers: IncredibleEmployment.be  This test is not yet approved or cleared by the [place] FDA and has been authorized for detection and/or diagnosis of SARS-CoV-2 by FDA under an Emergency Use Authorization (EUA). This EUA will remain in effect (meaning this test can be used) for the duration of the COVID-19 declaration under Section 564(b)(1) of the Act, 21 U.S.C. section 360bbb-3(b)(1), unless the authorization is terminated or revoked.     Resp Syncytial Virus by PCR NEGATIVE NEGATIVE Final    Comment:  (NOTE) Fact Sheet for Patients: EntrepreneurPulse.com.au  Fact Sheet for Healthcare Providers: IncredibleEmployment.be  This test is not yet approved or cleared by the [place] FDA and has been authorized for detection and/or diagnosis of SARS-CoV-2 by FDA under an Emergency Use Authorization (EUA). This EUA will remain in effect (meaning this test can be used) for the duration of the COVID-19 declaration under Section 564(b)(1) of the Act, 21 U.S.C. section 360bbb-3(b)(1), unless the authorization is terminated or revoked.  Performed at [facility], [place] [address], [place], [place] [address]      Medications:    apixaban  2.5 mg Oral BID   Followed by   Derrill Memo ON 12/24/2022] apixaban  5 mg Oral BID   vitamin C  500 mg Oral Daily   famotidine  20 mg Oral BID   finasteride  5 mg Oral Daily   fluticasone  2 spray Each Nare Daily   folic acid  [id number] mcg Oral Daily   insulin aspart  0-9 Units Subcutaneous TID WC   levothyroxine  25 mcg Oral Q0600   pantoprazole  40 mg Oral Daily   predniSONE  3 mg Oral Q breakfast   Treprostinil  48 mcg Inhalation 4 times per day   zinc sulfate  220 mg Oral Daily   Continuous Infusions:    LOS: 15 days   Charlynne Cousins  [facility]  12/23/2022, 8:05 AM

## 2022-12-23 NOTE — Progress Notes (Signed)
  Called the report to [facility] at [place]

## 2022-12-23 NOTE — TOC Transition Note (Addendum)
Transition of Care [facility]) - CM/SW Discharge Note   Patient Details  Name: [name] MRN: [id number] Date of Birth: 1946-04-11  Transition of Care [facility]) CM/SW Contact:  Jinger Neighbors, LCSW Phone Number: 12/23/2022, 11:38 AM   Clinical Narrative:     Pt discharging to [facility] via [place].   Call to report: [phone] Room, 520-1  Pt's dtr is picking up pts wheelchair and oxygen on Friday  Final next level of care: Skilled Nursing Facility Barriers to Discharge: No Barriers Identified   Patient Goals and CMS Choice CMS Medicare.gov Compare Post Acute Care list provided to:: Patient Choice offered to / list presented to : Patient  Discharge Placement                Patient chooses bed at: [facility] Patient to be transferred to facility by: [place] Name of family member notified: Self-PT Patient and family notified of of transfer: 12/21/22  Discharge Plan and Services Additional resources added to the After Visit Summary for   In-house Referral: NA Discharge Planning Services: CM Consult Post Acute Care Choice: Home Health          DME Arranged: N/A DME Agency: NA       HH Arranged: PT HH Agency: [place] Date [place]: 12/19/22 Time [place]: [id number] Representative spoke with at [place]: [place] Determinants of Health ([place]) Interventions SDOH Screenings   Food Insecurity: No Food Insecurity (06/01/2019)  Housing: Low Risk  (06/01/2019)  Depression (PHQ2-9): Low Risk  (06/01/2019)  Financial Resource Strain: Low Risk  (06/01/2019)  Physical Activity: Inactive (06/01/2019)  Stress: No Stress Concern Present (06/01/2019)  Tobacco Use: Medium Risk (10/23/2022)     Readmission Risk Interventions    12/08/2022    3:54 PM  Readmission Risk Prevention Plan  Transportation Screening Complete  PCP or Specialist Appt within 3-5 Days Complete  HRI or [place] Complete  Social Work Consult for [place]  Planning/Counseling Complete  Palliative Care Screening Not Applicable  Medication Review Press photographer) Complete

## 2022-12-24 ENCOUNTER — Ambulatory Visit: Payer: Medicare Other | Admitting: Internal Medicine

## 2022-12-24 DIAGNOSIS — D649 Anemia, unspecified: Secondary | ICD-10-CM | POA: Diagnosis not present

## 2022-12-24 DIAGNOSIS — Z7952 Long term (current) use of systemic steroids: Secondary | ICD-10-CM | POA: Diagnosis not present

## 2022-12-24 DIAGNOSIS — N390 Urinary tract infection, site not specified: Secondary | ICD-10-CM | POA: Diagnosis not present

## 2022-12-24 DIAGNOSIS — I2699 Other pulmonary embolism without acute cor pulmonale: Secondary | ICD-10-CM | POA: Diagnosis not present

## 2022-12-24 DIAGNOSIS — I50810 Right heart failure, unspecified: Secondary | ICD-10-CM | POA: Diagnosis not present

## 2022-12-24 DIAGNOSIS — R0902 Hypoxemia: Secondary | ICD-10-CM | POA: Diagnosis not present

## 2022-12-24 DIAGNOSIS — D696 Thrombocytopenia, unspecified: Secondary | ICD-10-CM | POA: Diagnosis not present

## 2022-12-24 DIAGNOSIS — A419 Sepsis, unspecified organism: Secondary | ICD-10-CM | POA: Diagnosis not present

## 2022-12-24 DIAGNOSIS — J439 Emphysema, unspecified: Secondary | ICD-10-CM | POA: Diagnosis not present

## 2022-12-24 DIAGNOSIS — R0602 Shortness of breath: Secondary | ICD-10-CM | POA: Diagnosis not present

## 2022-12-24 DIAGNOSIS — J9611 Chronic respiratory failure with hypoxia: Secondary | ICD-10-CM | POA: Diagnosis not present

## 2022-12-24 DIAGNOSIS — Z86711 Personal history of pulmonary embolism: Secondary | ICD-10-CM | POA: Diagnosis not present

## 2022-12-24 DIAGNOSIS — R6520 Severe sepsis without septic shock: Secondary | ICD-10-CM | POA: Diagnosis not present

## 2022-12-24 DIAGNOSIS — I272 Pulmonary hypertension, unspecified: Secondary | ICD-10-CM | POA: Diagnosis not present

## 2022-12-24 DIAGNOSIS — J1282 Pneumonia due to coronavirus disease 2019: Secondary | ICD-10-CM | POA: Diagnosis not present

## 2022-12-24 DIAGNOSIS — I2723 Pulmonary hypertension due to lung diseases and hypoxia: Secondary | ICD-10-CM | POA: Diagnosis not present

## 2022-12-24 DIAGNOSIS — I700 Atherosclerosis of aorta: Secondary | ICD-10-CM | POA: Diagnosis not present

## 2022-12-24 DIAGNOSIS — Z9981 Dependence on supplemental oxygen: Secondary | ICD-10-CM | POA: Diagnosis not present

## 2022-12-24 DIAGNOSIS — J9621 Acute and chronic respiratory failure with hypoxia: Secondary | ICD-10-CM | POA: Diagnosis not present

## 2022-12-24 DIAGNOSIS — B965 Pseudomonas (aeruginosa) (mallei) (pseudomallei) as the cause of diseases classified elsewhere: Secondary | ICD-10-CM | POA: Diagnosis not present

## 2022-12-24 DIAGNOSIS — R7989 Other specified abnormal findings of blood chemistry: Secondary | ICD-10-CM | POA: Diagnosis not present

## 2022-12-24 DIAGNOSIS — Z79899 Other long term (current) drug therapy: Secondary | ICD-10-CM | POA: Diagnosis not present

## 2022-12-24 DIAGNOSIS — N179 Acute kidney failure, unspecified: Secondary | ICD-10-CM | POA: Diagnosis not present

## 2022-12-24 DIAGNOSIS — I959 Hypotension, unspecified: Secondary | ICD-10-CM | POA: Diagnosis not present

## 2022-12-24 DIAGNOSIS — U071 COVID-19: Secondary | ICD-10-CM | POA: Diagnosis not present

## 2022-12-24 DIAGNOSIS — J8410 Pulmonary fibrosis, unspecified: Secondary | ICD-10-CM | POA: Diagnosis not present

## 2022-12-24 DIAGNOSIS — E119 Type 2 diabetes mellitus without complications: Secondary | ICD-10-CM | POA: Diagnosis not present

## 2022-12-24 DIAGNOSIS — E876 Hypokalemia: Secondary | ICD-10-CM | POA: Diagnosis not present

## 2022-12-24 DIAGNOSIS — J9620 Acute and chronic respiratory failure, unspecified whether with hypoxia or hypercapnia: Secondary | ICD-10-CM | POA: Diagnosis not present

## 2022-12-24 DIAGNOSIS — J449 Chronic obstructive pulmonary disease, unspecified: Secondary | ICD-10-CM | POA: Diagnosis not present

## 2022-12-24 DIAGNOSIS — E43 Unspecified severe protein-calorie malnutrition: Secondary | ICD-10-CM | POA: Diagnosis not present

## 2022-12-24 DIAGNOSIS — Z794 Long term (current) use of insulin: Secondary | ICD-10-CM | POA: Diagnosis not present

## 2022-12-24 DIAGNOSIS — I5031 Acute diastolic (congestive) heart failure: Secondary | ICD-10-CM | POA: Diagnosis not present

## 2022-12-24 DIAGNOSIS — R000 Tachycardia, unspecified: Secondary | ICD-10-CM | POA: Diagnosis not present

## 2022-12-24 DIAGNOSIS — I2510 Atherosclerotic heart disease of native coronary artery without angina pectoris: Secondary | ICD-10-CM | POA: Diagnosis not present

## 2022-12-24 DIAGNOSIS — I4891 Unspecified atrial fibrillation: Secondary | ICD-10-CM | POA: Diagnosis not present

## 2022-12-24 DIAGNOSIS — Z66 Do not resuscitate: Secondary | ICD-10-CM | POA: Diagnosis not present

## 2022-12-24 DIAGNOSIS — D638 Anemia in other chronic diseases classified elsewhere: Secondary | ICD-10-CM | POA: Diagnosis not present

## 2022-12-24 DIAGNOSIS — R54 Age-related physical debility: Secondary | ICD-10-CM | POA: Diagnosis not present

## 2022-12-24 DIAGNOSIS — M069 Rheumatoid arthritis, unspecified: Secondary | ICD-10-CM | POA: Diagnosis not present

## 2022-12-24 DIAGNOSIS — I469 Cardiac arrest, cause unspecified: Secondary | ICD-10-CM | POA: Diagnosis not present

## 2022-12-25 DIAGNOSIS — J1282 Pneumonia due to coronavirus disease 2019: Secondary | ICD-10-CM | POA: Diagnosis not present

## 2022-12-25 DIAGNOSIS — J9621 Acute and chronic respiratory failure with hypoxia: Secondary | ICD-10-CM | POA: Diagnosis not present

## 2022-12-25 DIAGNOSIS — D649 Anemia, unspecified: Secondary | ICD-10-CM | POA: Diagnosis not present

## 2022-12-25 DIAGNOSIS — R6520 Severe sepsis without septic shock: Secondary | ICD-10-CM | POA: Diagnosis not present

## 2022-12-25 DIAGNOSIS — E43 Unspecified severe protein-calorie malnutrition: Secondary | ICD-10-CM | POA: Diagnosis not present

## 2022-12-25 DIAGNOSIS — M069 Rheumatoid arthritis, unspecified: Secondary | ICD-10-CM | POA: Diagnosis not present

## 2022-12-25 DIAGNOSIS — A419 Sepsis, unspecified organism: Secondary | ICD-10-CM | POA: Diagnosis not present

## 2022-12-25 DIAGNOSIS — Z9981 Dependence on supplemental oxygen: Secondary | ICD-10-CM | POA: Diagnosis not present

## 2022-12-25 DIAGNOSIS — R54 Age-related physical debility: Secondary | ICD-10-CM | POA: Diagnosis not present

## 2022-12-25 DIAGNOSIS — E119 Type 2 diabetes mellitus without complications: Secondary | ICD-10-CM | POA: Diagnosis not present

## 2022-12-25 DIAGNOSIS — U071 COVID-19: Secondary | ICD-10-CM | POA: Diagnosis not present

## 2022-12-25 DIAGNOSIS — D696 Thrombocytopenia, unspecified: Secondary | ICD-10-CM | POA: Diagnosis not present

## 2022-12-26 DIAGNOSIS — M069 Rheumatoid arthritis, unspecified: Secondary | ICD-10-CM | POA: Diagnosis not present

## 2022-12-26 DIAGNOSIS — D649 Anemia, unspecified: Secondary | ICD-10-CM | POA: Diagnosis not present

## 2022-12-26 DIAGNOSIS — Z7952 Long term (current) use of systemic steroids: Secondary | ICD-10-CM | POA: Diagnosis not present

## 2022-12-26 DIAGNOSIS — E43 Unspecified severe protein-calorie malnutrition: Secondary | ICD-10-CM | POA: Diagnosis not present

## 2022-12-26 DIAGNOSIS — A419 Sepsis, unspecified organism: Secondary | ICD-10-CM | POA: Diagnosis not present

## 2022-12-26 DIAGNOSIS — U071 COVID-19: Secondary | ICD-10-CM | POA: Diagnosis not present

## 2022-12-26 DIAGNOSIS — R54 Age-related physical debility: Secondary | ICD-10-CM | POA: Diagnosis not present

## 2022-12-26 DIAGNOSIS — D696 Thrombocytopenia, unspecified: Secondary | ICD-10-CM | POA: Diagnosis not present

## 2022-12-26 DIAGNOSIS — R6520 Severe sepsis without septic shock: Secondary | ICD-10-CM | POA: Diagnosis not present

## 2022-12-26 DIAGNOSIS — J1282 Pneumonia due to coronavirus disease 2019: Secondary | ICD-10-CM | POA: Diagnosis not present

## 2022-12-26 DIAGNOSIS — J9621 Acute and chronic respiratory failure with hypoxia: Secondary | ICD-10-CM | POA: Diagnosis not present

## 2022-12-26 DIAGNOSIS — E119 Type 2 diabetes mellitus without complications: Secondary | ICD-10-CM | POA: Diagnosis not present

## 2022-12-27 DIAGNOSIS — A419 Sepsis, unspecified organism: Secondary | ICD-10-CM | POA: Diagnosis not present

## 2022-12-27 DIAGNOSIS — J1282 Pneumonia due to coronavirus disease 2019: Secondary | ICD-10-CM | POA: Diagnosis not present

## 2022-12-27 DIAGNOSIS — B965 Pseudomonas (aeruginosa) (mallei) (pseudomallei) as the cause of diseases classified elsewhere: Secondary | ICD-10-CM | POA: Diagnosis not present

## 2022-12-27 DIAGNOSIS — R54 Age-related physical debility: Secondary | ICD-10-CM | POA: Diagnosis not present

## 2022-12-27 DIAGNOSIS — D649 Anemia, unspecified: Secondary | ICD-10-CM | POA: Diagnosis not present

## 2022-12-27 DIAGNOSIS — U071 COVID-19: Secondary | ICD-10-CM | POA: Diagnosis not present

## 2022-12-27 DIAGNOSIS — E119 Type 2 diabetes mellitus without complications: Secondary | ICD-10-CM | POA: Diagnosis not present

## 2022-12-27 DIAGNOSIS — R6520 Severe sepsis without septic shock: Secondary | ICD-10-CM | POA: Diagnosis not present

## 2022-12-27 DIAGNOSIS — J9611 Chronic respiratory failure with hypoxia: Secondary | ICD-10-CM | POA: Diagnosis not present

## 2022-12-27 DIAGNOSIS — E43 Unspecified severe protein-calorie malnutrition: Secondary | ICD-10-CM | POA: Diagnosis not present

## 2022-12-27 DIAGNOSIS — N390 Urinary tract infection, site not specified: Secondary | ICD-10-CM | POA: Diagnosis not present

## 2022-12-27 DIAGNOSIS — D696 Thrombocytopenia, unspecified: Secondary | ICD-10-CM | POA: Diagnosis not present

## 2022-12-28 DIAGNOSIS — M069 Rheumatoid arthritis, unspecified: Secondary | ICD-10-CM | POA: Diagnosis not present

## 2022-12-28 DIAGNOSIS — E119 Type 2 diabetes mellitus without complications: Secondary | ICD-10-CM | POA: Diagnosis not present

## 2022-12-28 DIAGNOSIS — R54 Age-related physical debility: Secondary | ICD-10-CM | POA: Diagnosis not present

## 2022-12-28 DIAGNOSIS — D649 Anemia, unspecified: Secondary | ICD-10-CM | POA: Diagnosis not present

## 2022-12-28 DIAGNOSIS — N390 Urinary tract infection, site not specified: Secondary | ICD-10-CM | POA: Diagnosis not present

## 2022-12-28 DIAGNOSIS — R6520 Severe sepsis without septic shock: Secondary | ICD-10-CM | POA: Diagnosis not present

## 2022-12-28 DIAGNOSIS — D696 Thrombocytopenia, unspecified: Secondary | ICD-10-CM | POA: Diagnosis not present

## 2022-12-28 DIAGNOSIS — A419 Sepsis, unspecified organism: Secondary | ICD-10-CM | POA: Diagnosis not present

## 2022-12-28 DIAGNOSIS — B965 Pseudomonas (aeruginosa) (mallei) (pseudomallei) as the cause of diseases classified elsewhere: Secondary | ICD-10-CM | POA: Diagnosis not present

## 2022-12-28 DIAGNOSIS — E43 Unspecified severe protein-calorie malnutrition: Secondary | ICD-10-CM | POA: Diagnosis not present

## 2022-12-28 DIAGNOSIS — U071 COVID-19: Secondary | ICD-10-CM | POA: Diagnosis not present

## 2022-12-28 DIAGNOSIS — J1282 Pneumonia due to coronavirus disease 2019: Secondary | ICD-10-CM | POA: Diagnosis not present

## 2022-12-29 DIAGNOSIS — I50810 Right heart failure, unspecified: Secondary | ICD-10-CM | POA: Diagnosis not present

## 2022-12-29 DIAGNOSIS — D649 Anemia, unspecified: Secondary | ICD-10-CM | POA: Diagnosis not present

## 2022-12-29 DIAGNOSIS — R7989 Other specified abnormal findings of blood chemistry: Secondary | ICD-10-CM | POA: Diagnosis not present

## 2022-12-29 DIAGNOSIS — B965 Pseudomonas (aeruginosa) (mallei) (pseudomallei) as the cause of diseases classified elsewhere: Secondary | ICD-10-CM | POA: Diagnosis not present

## 2022-12-29 DIAGNOSIS — Z86711 Personal history of pulmonary embolism: Secondary | ICD-10-CM | POA: Diagnosis not present

## 2022-12-29 DIAGNOSIS — I4891 Unspecified atrial fibrillation: Secondary | ICD-10-CM | POA: Diagnosis not present

## 2022-12-29 DIAGNOSIS — R6520 Severe sepsis without septic shock: Secondary | ICD-10-CM | POA: Diagnosis not present

## 2022-12-29 DIAGNOSIS — R54 Age-related physical debility: Secondary | ICD-10-CM | POA: Diagnosis not present

## 2022-12-29 DIAGNOSIS — J9621 Acute and chronic respiratory failure with hypoxia: Secondary | ICD-10-CM | POA: Diagnosis not present

## 2022-12-29 DIAGNOSIS — D696 Thrombocytopenia, unspecified: Secondary | ICD-10-CM | POA: Diagnosis not present

## 2022-12-29 DIAGNOSIS — E43 Unspecified severe protein-calorie malnutrition: Secondary | ICD-10-CM | POA: Diagnosis not present

## 2022-12-29 DIAGNOSIS — U071 COVID-19: Secondary | ICD-10-CM | POA: Diagnosis not present

## 2022-12-29 DIAGNOSIS — I2510 Atherosclerotic heart disease of native coronary artery without angina pectoris: Secondary | ICD-10-CM | POA: Diagnosis not present

## 2022-12-29 DIAGNOSIS — N390 Urinary tract infection, site not specified: Secondary | ICD-10-CM | POA: Diagnosis not present

## 2022-12-29 DIAGNOSIS — J1282 Pneumonia due to coronavirus disease 2019: Secondary | ICD-10-CM | POA: Diagnosis not present

## 2022-12-29 DIAGNOSIS — Z79899 Other long term (current) drug therapy: Secondary | ICD-10-CM | POA: Diagnosis not present

## 2022-12-29 DIAGNOSIS — I2723 Pulmonary hypertension due to lung diseases and hypoxia: Secondary | ICD-10-CM | POA: Diagnosis not present

## 2022-12-29 DIAGNOSIS — R0602 Shortness of breath: Secondary | ICD-10-CM | POA: Diagnosis not present

## 2022-12-29 DIAGNOSIS — J8410 Pulmonary fibrosis, unspecified: Secondary | ICD-10-CM | POA: Diagnosis not present

## 2022-12-29 DIAGNOSIS — A419 Sepsis, unspecified organism: Secondary | ICD-10-CM | POA: Diagnosis not present

## 2022-12-30 ENCOUNTER — Telehealth: Payer: Self-pay | Admitting: *Deleted

## 2022-12-30 ENCOUNTER — Telehealth: Payer: Self-pay | Admitting: Internal Medicine

## 2022-12-30 DIAGNOSIS — R54 Age-related physical debility: Secondary | ICD-10-CM | POA: Diagnosis not present

## 2022-12-30 DIAGNOSIS — D649 Anemia, unspecified: Secondary | ICD-10-CM | POA: Diagnosis not present

## 2022-12-30 DIAGNOSIS — E43 Unspecified severe protein-calorie malnutrition: Secondary | ICD-10-CM | POA: Diagnosis not present

## 2022-12-30 DIAGNOSIS — J1282 Pneumonia due to coronavirus disease 2019: Secondary | ICD-10-CM | POA: Diagnosis not present

## 2022-12-30 DIAGNOSIS — A419 Sepsis, unspecified organism: Secondary | ICD-10-CM | POA: Diagnosis not present

## 2022-12-30 DIAGNOSIS — R7989 Other specified abnormal findings of blood chemistry: Secondary | ICD-10-CM | POA: Diagnosis not present

## 2022-12-30 DIAGNOSIS — J9621 Acute and chronic respiratory failure with hypoxia: Secondary | ICD-10-CM | POA: Diagnosis not present

## 2022-12-30 DIAGNOSIS — D696 Thrombocytopenia, unspecified: Secondary | ICD-10-CM | POA: Diagnosis not present

## 2022-12-30 DIAGNOSIS — B965 Pseudomonas (aeruginosa) (mallei) (pseudomallei) as the cause of diseases classified elsewhere: Secondary | ICD-10-CM | POA: Diagnosis not present

## 2022-12-30 DIAGNOSIS — N390 Urinary tract infection, site not specified: Secondary | ICD-10-CM | POA: Diagnosis not present

## 2022-12-30 DIAGNOSIS — R6520 Severe sepsis without septic shock: Secondary | ICD-10-CM | POA: Diagnosis not present

## 2022-12-30 DIAGNOSIS — U071 COVID-19: Secondary | ICD-10-CM | POA: Diagnosis not present

## 2022-12-30 NOTE — Telephone Encounter (Signed)
Called [facility] to determine status of Tyvaso. [name] on DPI formulation which goes through pharmacy benefit. He also appears to have a grant on file through [employer]. Per [facility], scheduled delivery is for tomorrow 12/31/2022 and no copay in [facility] portal after grant picked up the cost  Phone: [phone]  Spoke with patient - advised him that grant is picking up copay for Tyvaso. Advised that we need him to continue that medication for his ILD-PH. Nothing further needed  Knox Saliva, PharmD, MPH, BCPS, CPP Clinical Pharmacist (Rheumatology and Pulmonology)

## 2022-12-30 NOTE — Telephone Encounter (Signed)
Spoke with patient he wonders if he still needs to be on Tyvaso. Looked in patients chart from what I can tell Dr. Chase Caller wanted him to continue on this. Patient states he doesn't think he can afford this he advises the medication is over $1,000 dollars.  Pharmacy team can you please assist

## 2022-12-30 NOTE — Telephone Encounter (Signed)
[facility] (patient is managed by Dr. Kathlene November NOT Dr. Amil Amen) regarding clearance to start pt on Actemra sooner than June 2024 (which would be 6 months after last Rituxan infusion). Left message with front desk of office.  Phone: [phone]  Knox Saliva, PharmD, MPH, BCPS, CPP Clinical Pharmacist (Rheumatology and Pulmonology)

## 2022-12-30 NOTE — Telephone Encounter (Signed)
Patient would like the nurse to call him regarding the medication he is on.  He wants to know if the doctor still wants him to continue.  He didn't know the name of the medication.  Please call patient to discuss at [phone]

## 2022-12-30 NOTE — Telephone Encounter (Signed)
Pt called regarding not having DME oxygen tanks. Pt recently discharged from [place] and LCSW note states that his daughter was to pick up oxygen along with wheelchair;  pt states she got the wheelchair but no oxygen.  RNCM reviewed chart and did not locate order for oxygen.  RNCM consulted TOC staff for discharging unit to assist with this matter.

## 2022-12-30 NOTE — Telephone Encounter (Signed)
Staff located oxygen tanks and notified daughter, Alwyn Ren to pick up at her convenience.

## 2022-12-31 DIAGNOSIS — R6520 Severe sepsis without septic shock: Secondary | ICD-10-CM | POA: Diagnosis not present

## 2022-12-31 DIAGNOSIS — A419 Sepsis, unspecified organism: Secondary | ICD-10-CM | POA: Diagnosis not present

## 2022-12-31 DIAGNOSIS — I5031 Acute diastolic (congestive) heart failure: Secondary | ICD-10-CM | POA: Diagnosis not present

## 2022-12-31 DIAGNOSIS — D696 Thrombocytopenia, unspecified: Secondary | ICD-10-CM | POA: Diagnosis not present

## 2022-12-31 DIAGNOSIS — E43 Unspecified severe protein-calorie malnutrition: Secondary | ICD-10-CM | POA: Diagnosis not present

## 2022-12-31 DIAGNOSIS — N390 Urinary tract infection, site not specified: Secondary | ICD-10-CM | POA: Diagnosis not present

## 2022-12-31 DIAGNOSIS — R54 Age-related physical debility: Secondary | ICD-10-CM | POA: Diagnosis not present

## 2022-12-31 DIAGNOSIS — E119 Type 2 diabetes mellitus without complications: Secondary | ICD-10-CM | POA: Diagnosis not present

## 2022-12-31 DIAGNOSIS — B965 Pseudomonas (aeruginosa) (mallei) (pseudomallei) as the cause of diseases classified elsewhere: Secondary | ICD-10-CM | POA: Diagnosis not present

## 2022-12-31 DIAGNOSIS — D638 Anemia in other chronic diseases classified elsewhere: Secondary | ICD-10-CM | POA: Diagnosis not present

## 2022-12-31 DIAGNOSIS — J9621 Acute and chronic respiratory failure with hypoxia: Secondary | ICD-10-CM | POA: Diagnosis not present

## 2022-12-31 NOTE — Telephone Encounter (Signed)
Received return VM from [facility] regarding patient. Per fron tdesk staff, she is not at desk. Sent another message back  Knox Saliva, PharmD, MPH, BCPS, CPP Clinical Pharmacist (Rheumatology and Pulmonology)

## 2022-12-31 NOTE — Telephone Encounter (Signed)
Charlene from [facility] returned call on behalf of Dr. Kathlene November. Dr. Kathlene November stated that patient needs to wait 6 motnhs from last Rituxan infusion to start Actemra. Routing to Dr. Chase Caller as Juluis Rainier.  Patient received last Rituxan infusion on 10/21/2022 and would be able ot start Actemra on 04/22/2023  Knox Saliva, PharmD, MPH, BCPS, CPP Clinical Pharmacist (Rheumatology and Pulmonology)

## 2023-01-01 ENCOUNTER — Telehealth: Payer: Self-pay | Admitting: Internal Medicine

## 2023-01-01 DIAGNOSIS — D696 Thrombocytopenia, unspecified: Secondary | ICD-10-CM | POA: Diagnosis not present

## 2023-01-01 DIAGNOSIS — E43 Unspecified severe protein-calorie malnutrition: Secondary | ICD-10-CM | POA: Diagnosis not present

## 2023-01-01 DIAGNOSIS — B965 Pseudomonas (aeruginosa) (mallei) (pseudomallei) as the cause of diseases classified elsewhere: Secondary | ICD-10-CM | POA: Diagnosis not present

## 2023-01-01 DIAGNOSIS — I5031 Acute diastolic (congestive) heart failure: Secondary | ICD-10-CM | POA: Diagnosis not present

## 2023-01-01 DIAGNOSIS — R6520 Severe sepsis without septic shock: Secondary | ICD-10-CM | POA: Diagnosis not present

## 2023-01-01 DIAGNOSIS — J9621 Acute and chronic respiratory failure with hypoxia: Secondary | ICD-10-CM | POA: Diagnosis not present

## 2023-01-01 DIAGNOSIS — R0902 Hypoxemia: Secondary | ICD-10-CM | POA: Diagnosis not present

## 2023-01-01 DIAGNOSIS — E119 Type 2 diabetes mellitus without complications: Secondary | ICD-10-CM | POA: Diagnosis not present

## 2023-01-01 DIAGNOSIS — A419 Sepsis, unspecified organism: Secondary | ICD-10-CM | POA: Diagnosis not present

## 2023-01-01 DIAGNOSIS — D638 Anemia in other chronic diseases classified elsewhere: Secondary | ICD-10-CM | POA: Diagnosis not present

## 2023-01-01 DIAGNOSIS — N390 Urinary tract infection, site not specified: Secondary | ICD-10-CM | POA: Diagnosis not present

## 2023-01-01 DIAGNOSIS — R54 Age-related physical debility: Secondary | ICD-10-CM | POA: Diagnosis not present

## 2023-01-01 NOTE — Telephone Encounter (Signed)
PT called from Hospital saying they won't give him a specific medication he takes. Pls call on his cell to advise. (Note: [name] hard to understand so someone that speaks [place] needed to call please.) [phone]

## 2023-01-01 NOTE — Telephone Encounter (Signed)
Patient is inpatient and should address his concerns with the inpatient team. Will close encounter.

## 2023-01-01 NOTE — Telephone Encounter (Signed)
Patient is returning a call.  Please call patient back at [phone]

## 2023-01-02 DIAGNOSIS — I5031 Acute diastolic (congestive) heart failure: Secondary | ICD-10-CM | POA: Diagnosis not present

## 2023-01-02 DIAGNOSIS — A419 Sepsis, unspecified organism: Secondary | ICD-10-CM | POA: Diagnosis not present

## 2023-01-02 DIAGNOSIS — B965 Pseudomonas (aeruginosa) (mallei) (pseudomallei) as the cause of diseases classified elsewhere: Secondary | ICD-10-CM | POA: Diagnosis not present

## 2023-01-02 DIAGNOSIS — E43 Unspecified severe protein-calorie malnutrition: Secondary | ICD-10-CM | POA: Diagnosis not present

## 2023-01-02 DIAGNOSIS — N390 Urinary tract infection, site not specified: Secondary | ICD-10-CM | POA: Diagnosis not present

## 2023-01-02 DIAGNOSIS — D649 Anemia, unspecified: Secondary | ICD-10-CM | POA: Diagnosis not present

## 2023-01-02 DIAGNOSIS — E119 Type 2 diabetes mellitus without complications: Secondary | ICD-10-CM | POA: Diagnosis not present

## 2023-01-02 DIAGNOSIS — D696 Thrombocytopenia, unspecified: Secondary | ICD-10-CM | POA: Diagnosis not present

## 2023-01-02 DIAGNOSIS — N179 Acute kidney failure, unspecified: Secondary | ICD-10-CM | POA: Diagnosis not present

## 2023-01-02 DIAGNOSIS — J9620 Acute and chronic respiratory failure, unspecified whether with hypoxia or hypercapnia: Secondary | ICD-10-CM | POA: Diagnosis not present

## 2023-01-02 DIAGNOSIS — R54 Age-related physical debility: Secondary | ICD-10-CM | POA: Diagnosis not present

## 2023-01-03 DIAGNOSIS — R6520 Severe sepsis without septic shock: Secondary | ICD-10-CM | POA: Diagnosis not present

## 2023-01-03 DIAGNOSIS — E876 Hypokalemia: Secondary | ICD-10-CM | POA: Diagnosis not present

## 2023-01-03 DIAGNOSIS — R54 Age-related physical debility: Secondary | ICD-10-CM | POA: Diagnosis not present

## 2023-01-03 DIAGNOSIS — J9621 Acute and chronic respiratory failure with hypoxia: Secondary | ICD-10-CM | POA: Diagnosis not present

## 2023-01-03 DIAGNOSIS — I5031 Acute diastolic (congestive) heart failure: Secondary | ICD-10-CM | POA: Diagnosis not present

## 2023-01-03 DIAGNOSIS — R0902 Hypoxemia: Secondary | ICD-10-CM | POA: Diagnosis not present

## 2023-01-03 DIAGNOSIS — A419 Sepsis, unspecified organism: Secondary | ICD-10-CM | POA: Diagnosis not present

## 2023-01-03 DIAGNOSIS — D638 Anemia in other chronic diseases classified elsewhere: Secondary | ICD-10-CM | POA: Diagnosis not present

## 2023-01-03 DIAGNOSIS — D696 Thrombocytopenia, unspecified: Secondary | ICD-10-CM | POA: Diagnosis not present

## 2023-01-03 DIAGNOSIS — M069 Rheumatoid arthritis, unspecified: Secondary | ICD-10-CM | POA: Diagnosis not present

## 2023-01-03 DIAGNOSIS — E119 Type 2 diabetes mellitus without complications: Secondary | ICD-10-CM | POA: Diagnosis not present

## 2023-01-03 DIAGNOSIS — E43 Unspecified severe protein-calorie malnutrition: Secondary | ICD-10-CM | POA: Diagnosis not present

## 2023-01-04 DIAGNOSIS — R6520 Severe sepsis without septic shock: Secondary | ICD-10-CM | POA: Diagnosis not present

## 2023-01-04 DIAGNOSIS — E43 Unspecified severe protein-calorie malnutrition: Secondary | ICD-10-CM | POA: Diagnosis not present

## 2023-01-04 DIAGNOSIS — J9621 Acute and chronic respiratory failure with hypoxia: Secondary | ICD-10-CM | POA: Diagnosis not present

## 2023-01-04 DIAGNOSIS — E876 Hypokalemia: Secondary | ICD-10-CM | POA: Diagnosis not present

## 2023-01-04 DIAGNOSIS — R54 Age-related physical debility: Secondary | ICD-10-CM | POA: Diagnosis not present

## 2023-01-04 DIAGNOSIS — D696 Thrombocytopenia, unspecified: Secondary | ICD-10-CM | POA: Diagnosis not present

## 2023-01-04 DIAGNOSIS — D638 Anemia in other chronic diseases classified elsewhere: Secondary | ICD-10-CM | POA: Diagnosis not present

## 2023-01-04 DIAGNOSIS — I5031 Acute diastolic (congestive) heart failure: Secondary | ICD-10-CM | POA: Diagnosis not present

## 2023-01-04 DIAGNOSIS — E119 Type 2 diabetes mellitus without complications: Secondary | ICD-10-CM | POA: Diagnosis not present

## 2023-01-04 DIAGNOSIS — A419 Sepsis, unspecified organism: Secondary | ICD-10-CM | POA: Diagnosis not present

## 2023-01-04 DIAGNOSIS — M069 Rheumatoid arthritis, unspecified: Secondary | ICD-10-CM | POA: Diagnosis not present

## 2023-01-05 DIAGNOSIS — D696 Thrombocytopenia, unspecified: Secondary | ICD-10-CM | POA: Diagnosis not present

## 2023-01-05 DIAGNOSIS — M069 Rheumatoid arthritis, unspecified: Secondary | ICD-10-CM | POA: Diagnosis not present

## 2023-01-05 DIAGNOSIS — I2699 Other pulmonary embolism without acute cor pulmonale: Secondary | ICD-10-CM | POA: Diagnosis not present

## 2023-01-05 DIAGNOSIS — R6520 Severe sepsis without septic shock: Secondary | ICD-10-CM | POA: Diagnosis not present

## 2023-01-05 DIAGNOSIS — D638 Anemia in other chronic diseases classified elsewhere: Secondary | ICD-10-CM | POA: Diagnosis not present

## 2023-01-05 DIAGNOSIS — A419 Sepsis, unspecified organism: Secondary | ICD-10-CM | POA: Diagnosis not present

## 2023-01-05 DIAGNOSIS — U071 COVID-19: Secondary | ICD-10-CM | POA: Diagnosis not present

## 2023-01-05 DIAGNOSIS — E43 Unspecified severe protein-calorie malnutrition: Secondary | ICD-10-CM | POA: Diagnosis not present

## 2023-01-05 DIAGNOSIS — E119 Type 2 diabetes mellitus without complications: Secondary | ICD-10-CM | POA: Diagnosis not present

## 2023-01-05 DIAGNOSIS — J9621 Acute and chronic respiratory failure with hypoxia: Secondary | ICD-10-CM | POA: Diagnosis not present

## 2023-01-05 DIAGNOSIS — N179 Acute kidney failure, unspecified: Secondary | ICD-10-CM | POA: Diagnosis not present

## 2023-01-17 DIAGNOSIS — R531 Weakness: Secondary | ICD-10-CM | POA: Diagnosis not present

## 2023-01-21 ENCOUNTER — Other Ambulatory Visit (HOSPITAL_COMMUNITY): Payer: Self-pay

## 2023-02-01 ENCOUNTER — Encounter: Payer: Medicare Other | Admitting: Internal Medicine

## 2023-02-01 NOTE — Patient Instructions (Signed)
Interstitial lung disease due to connective tissue disease (HCC) Chronic respiratory failure with hypoxia (HCC) Immunosuppressed and on high risk medication  - Progressive worsening of pulmonary fibrosis on symptoms, CT and PFT but since last visti PFT stable - worried outlook is bad   Plan - continue o2 at 4L [place] - do spiro/dlco in 3 months -At this point in time no antifibrotic's because of side effects --Continue supportive care with oxygen therapy and physical conditioning -Immunomodulator therapy   - Rituxan last dose approx 10/20/22 -> our office Phmaracy team is changing you to Acterma subcut injection   - once you are on Actemra we will inform Dr Aryal  - prednisone to continue as before -Continue participation in ILD-pro registry - if you decline to point you cannot do simple things at home we will have to discuss hospice but for now let us see if changing Rituxan to Actemra can help you  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 Alfredo Spong for standard of care visit in 30-minute slot in 3-4 months but after PFT  = ild symptom score and walk test at followup 

## 2023-02-01 NOTE — Progress Notes (Signed)
 This encounter was created in error - please disregard.

## 2023-02-01 NOTE — Telephone Encounter (Signed)
Rx tteam - This happened last time too and Dr Kathlene November went ahead and gave Rituxan. The 6 month is bsed on average time for b cells to re-emerge. There is nothing inherent otherwise about 6 months and the question really is about double immune suppression which is a shared decsion making risk and there is no contraindication to it  Given fact he has had covid recently and has  physically declined I am ok to wait. I am seeing him 02/01/23 and will let yu know when we should roll over to Actemra - sometime between April - June 2024 versus palliation  Closing message

## 2023-02-01 DEATH — deceased

## 2023-02-02 NOTE — Telephone Encounter (Signed)
Updated.

## 2023-02-02 NOTE — Telephone Encounter (Signed)
Patient no-show to f/u w Dr. Chase Caller on 02/01/2023  Knox Saliva, PharmD, MPH, BCPS, CPP Clinical Pharmacist (Rheumatology and Pulmonology)

## 2023-10-07 ENCOUNTER — Encounter: Payer: Self-pay | Admitting: Internal Medicine
